# Patient Record
Sex: Male | Born: 1985 | Race: White | Hispanic: No | Smoking: Current every day smoker
Health system: Southern US, Community
[De-identification: ages and names within clinical notes are randomized; demographics above are authoritative.]

## PROBLEM LIST (undated history)

## (undated) DIAGNOSIS — F1020 Alcohol dependence, uncomplicated: Secondary | ICD-10-CM

---

## 2011-11-10 ENCOUNTER — Emergency Department: Payer: Self-pay | Admitting: Emergency Medicine

## 2019-11-22 ENCOUNTER — Ambulatory Visit: Payer: Medicaid Other | Attending: Internal Medicine

## 2020-05-03 ENCOUNTER — Inpatient Hospital Stay
Admission: EM | Admit: 2020-05-03 | Discharge: 2020-05-31 | DRG: 438 | Disposition: A | Discharge status: Home Health | Payer: Medicaid Other | Attending: Internal Medicine | Admitting: Internal Medicine

## 2020-05-03 ENCOUNTER — Emergency Department: Payer: Medicaid Other

## 2020-05-03 ENCOUNTER — Encounter: Payer: Self-pay | Admitting: Internal Medicine

## 2020-05-03 ENCOUNTER — Other Ambulatory Visit: Payer: Self-pay

## 2020-05-03 DIAGNOSIS — Z800 Family history of malignant neoplasm of digestive organs: Secondary | ICD-10-CM

## 2020-05-03 DIAGNOSIS — Z66 Do not resuscitate: Secondary | ICD-10-CM | POA: Diagnosis not present

## 2020-05-03 DIAGNOSIS — D649 Anemia, unspecified: Secondary | ICD-10-CM | POA: Diagnosis not present

## 2020-05-03 DIAGNOSIS — E8770 Fluid overload, unspecified: Secondary | ICD-10-CM | POA: Diagnosis present

## 2020-05-03 DIAGNOSIS — F1010 Alcohol abuse, uncomplicated: Secondary | ICD-10-CM | POA: Diagnosis not present

## 2020-05-03 DIAGNOSIS — F10229 Alcohol dependence with intoxication, unspecified: Secondary | ICD-10-CM | POA: Diagnosis present

## 2020-05-03 DIAGNOSIS — K852 Alcohol induced acute pancreatitis without necrosis or infection: Principal | ICD-10-CM | POA: Diagnosis present

## 2020-05-03 DIAGNOSIS — L89620 Pressure ulcer of left heel, unstageable: Secondary | ICD-10-CM | POA: Diagnosis present

## 2020-05-03 DIAGNOSIS — E861 Hypovolemia: Secondary | ICD-10-CM | POA: Diagnosis present

## 2020-05-03 DIAGNOSIS — R7982 Elevated C-reactive protein (CRP): Secondary | ICD-10-CM | POA: Diagnosis not present

## 2020-05-03 DIAGNOSIS — F411 Generalized anxiety disorder: Secondary | ICD-10-CM | POA: Diagnosis present

## 2020-05-03 DIAGNOSIS — Z23 Encounter for immunization: Secondary | ICD-10-CM

## 2020-05-03 DIAGNOSIS — K7011 Alcoholic hepatitis with ascites: Secondary | ICD-10-CM

## 2020-05-03 DIAGNOSIS — B370 Candidal stomatitis: Secondary | ICD-10-CM | POA: Diagnosis not present

## 2020-05-03 DIAGNOSIS — L89610 Pressure ulcer of right heel, unstageable: Secondary | ICD-10-CM | POA: Diagnosis present

## 2020-05-03 DIAGNOSIS — Z811 Family history of alcohol abuse and dependence: Secondary | ICD-10-CM

## 2020-05-03 DIAGNOSIS — R000 Tachycardia, unspecified: Secondary | ICD-10-CM | POA: Diagnosis not present

## 2020-05-03 DIAGNOSIS — R64 Cachexia: Secondary | ICD-10-CM | POA: Diagnosis present

## 2020-05-03 DIAGNOSIS — L8990 Pressure ulcer of unspecified site, unspecified stage: Secondary | ICD-10-CM | POA: Insufficient documentation

## 2020-05-03 DIAGNOSIS — Z681 Body mass index (BMI) 19 or less, adult: Secondary | ICD-10-CM

## 2020-05-03 DIAGNOSIS — G9341 Metabolic encephalopathy: Secondary | ICD-10-CM | POA: Diagnosis present

## 2020-05-03 DIAGNOSIS — E871 Hypo-osmolality and hyponatremia: Secondary | ICD-10-CM

## 2020-05-03 DIAGNOSIS — M793 Panniculitis, unspecified: Secondary | ICD-10-CM | POA: Diagnosis present

## 2020-05-03 DIAGNOSIS — G319 Degenerative disease of nervous system, unspecified: Secondary | ICD-10-CM | POA: Diagnosis present

## 2020-05-03 DIAGNOSIS — D638 Anemia in other chronic diseases classified elsewhere: Secondary | ICD-10-CM | POA: Diagnosis present

## 2020-05-03 DIAGNOSIS — E876 Hypokalemia: Secondary | ICD-10-CM | POA: Diagnosis not present

## 2020-05-03 DIAGNOSIS — N179 Acute kidney failure, unspecified: Secondary | ICD-10-CM | POA: Diagnosis present

## 2020-05-03 DIAGNOSIS — I248 Other forms of acute ischemic heart disease: Secondary | ICD-10-CM | POA: Diagnosis present

## 2020-05-03 DIAGNOSIS — T8092XA Unspecified transfusion reaction, initial encounter: Secondary | ICD-10-CM | POA: Diagnosis not present

## 2020-05-03 DIAGNOSIS — F10231 Alcohol dependence with withdrawal delirium: Secondary | ICD-10-CM | POA: Diagnosis present

## 2020-05-03 DIAGNOSIS — I776 Arteritis, unspecified: Secondary | ICD-10-CM | POA: Diagnosis not present

## 2020-05-03 DIAGNOSIS — L129 Pemphigoid, unspecified: Secondary | ICD-10-CM | POA: Diagnosis present

## 2020-05-03 DIAGNOSIS — R946 Abnormal results of thyroid function studies: Secondary | ICD-10-CM | POA: Diagnosis not present

## 2020-05-03 DIAGNOSIS — Z7189 Other specified counseling: Secondary | ICD-10-CM | POA: Diagnosis not present

## 2020-05-03 DIAGNOSIS — K861 Other chronic pancreatitis: Secondary | ICD-10-CM | POA: Diagnosis present

## 2020-05-03 DIAGNOSIS — A419 Sepsis, unspecified organism: Secondary | ICD-10-CM | POA: Diagnosis not present

## 2020-05-03 DIAGNOSIS — R748 Abnormal levels of other serum enzymes: Secondary | ICD-10-CM | POA: Diagnosis not present

## 2020-05-03 DIAGNOSIS — R9401 Abnormal electroencephalogram [EEG]: Secondary | ICD-10-CM | POA: Diagnosis present

## 2020-05-03 DIAGNOSIS — I959 Hypotension, unspecified: Secondary | ICD-10-CM | POA: Diagnosis not present

## 2020-05-03 DIAGNOSIS — K760 Fatty (change of) liver, not elsewhere classified: Secondary | ICD-10-CM | POA: Diagnosis present

## 2020-05-03 DIAGNOSIS — F332 Major depressive disorder, recurrent severe without psychotic features: Secondary | ICD-10-CM | POA: Diagnosis present

## 2020-05-03 DIAGNOSIS — Y848 Other medical procedures as the cause of abnormal reaction of the patient, or of later complication, without mention of misadventure at the time of the procedure: Secondary | ICD-10-CM | POA: Diagnosis not present

## 2020-05-03 DIAGNOSIS — Z515 Encounter for palliative care: Secondary | ICD-10-CM | POA: Diagnosis not present

## 2020-05-03 DIAGNOSIS — R6521 Severe sepsis with septic shock: Secondary | ICD-10-CM | POA: Diagnosis not present

## 2020-05-03 DIAGNOSIS — E860 Dehydration: Secondary | ICD-10-CM | POA: Diagnosis present

## 2020-05-03 DIAGNOSIS — R4182 Altered mental status, unspecified: Secondary | ICD-10-CM | POA: Diagnosis not present

## 2020-05-03 DIAGNOSIS — R195 Other fecal abnormalities: Secondary | ICD-10-CM | POA: Diagnosis not present

## 2020-05-03 DIAGNOSIS — E872 Acidosis: Secondary | ICD-10-CM | POA: Diagnosis not present

## 2020-05-03 DIAGNOSIS — K863 Pseudocyst of pancreas: Secondary | ICD-10-CM | POA: Diagnosis present

## 2020-05-03 DIAGNOSIS — Z20822 Contact with and (suspected) exposure to covid-19: Secondary | ICD-10-CM | POA: Diagnosis present

## 2020-05-03 DIAGNOSIS — F17200 Nicotine dependence, unspecified, uncomplicated: Secondary | ICD-10-CM | POA: Diagnosis present

## 2020-05-03 DIAGNOSIS — R188 Other ascites: Secondary | ICD-10-CM | POA: Diagnosis not present

## 2020-05-03 DIAGNOSIS — R300 Dysuria: Secondary | ICD-10-CM | POA: Diagnosis not present

## 2020-05-03 DIAGNOSIS — R700 Elevated erythrocyte sedimentation rate: Secondary | ICD-10-CM | POA: Diagnosis not present

## 2020-05-03 DIAGNOSIS — R52 Pain, unspecified: Secondary | ICD-10-CM

## 2020-05-03 DIAGNOSIS — K2920 Alcoholic gastritis without bleeding: Secondary | ICD-10-CM | POA: Diagnosis present

## 2020-05-03 DIAGNOSIS — R21 Rash and other nonspecific skin eruption: Secondary | ICD-10-CM | POA: Diagnosis not present

## 2020-05-03 DIAGNOSIS — F10931 Alcohol use, unspecified with withdrawal delirium: Secondary | ICD-10-CM

## 2020-05-03 DIAGNOSIS — K659 Peritonitis, unspecified: Secondary | ICD-10-CM | POA: Diagnosis present

## 2020-05-03 DIAGNOSIS — D72829 Elevated white blood cell count, unspecified: Secondary | ICD-10-CM | POA: Diagnosis not present

## 2020-05-03 DIAGNOSIS — K859 Acute pancreatitis without necrosis or infection, unspecified: Secondary | ICD-10-CM | POA: Diagnosis not present

## 2020-05-03 DIAGNOSIS — D6862 Lupus anticoagulant syndrome: Secondary | ICD-10-CM | POA: Diagnosis present

## 2020-05-03 DIAGNOSIS — Z95828 Presence of other vascular implants and grafts: Secondary | ICD-10-CM

## 2020-05-03 DIAGNOSIS — L89150 Pressure ulcer of sacral region, unstageable: Secondary | ICD-10-CM | POA: Diagnosis present

## 2020-05-03 DIAGNOSIS — M352 Behcet's disease: Secondary | ICD-10-CM | POA: Diagnosis present

## 2020-05-03 DIAGNOSIS — Z4659 Encounter for fitting and adjustment of other gastrointestinal appliance and device: Secondary | ICD-10-CM

## 2020-05-03 DIAGNOSIS — D696 Thrombocytopenia, unspecified: Secondary | ICD-10-CM | POA: Diagnosis not present

## 2020-05-03 DIAGNOSIS — E512 Wernicke's encephalopathy: Secondary | ICD-10-CM | POA: Diagnosis not present

## 2020-05-03 DIAGNOSIS — E43 Unspecified severe protein-calorie malnutrition: Secondary | ICD-10-CM

## 2020-05-03 DIAGNOSIS — R140 Abdominal distension (gaseous): Secondary | ICD-10-CM

## 2020-05-03 DIAGNOSIS — R410 Disorientation, unspecified: Secondary | ICD-10-CM | POA: Diagnosis present

## 2020-05-03 DIAGNOSIS — R197 Diarrhea, unspecified: Secondary | ICD-10-CM | POA: Diagnosis not present

## 2020-05-03 DIAGNOSIS — R578 Other shock: Secondary | ICD-10-CM | POA: Diagnosis not present

## 2020-05-03 DIAGNOSIS — G08 Intracranial and intraspinal phlebitis and thrombophlebitis: Secondary | ICD-10-CM | POA: Diagnosis present

## 2020-05-03 DIAGNOSIS — R569 Unspecified convulsions: Secondary | ICD-10-CM | POA: Diagnosis present

## 2020-05-03 DIAGNOSIS — R319 Hematuria, unspecified: Secondary | ICD-10-CM | POA: Diagnosis not present

## 2020-05-03 DIAGNOSIS — Z0189 Encounter for other specified special examinations: Secondary | ICD-10-CM

## 2020-05-03 HISTORY — DX: Alcohol dependence, uncomplicated: F10.20

## 2020-05-03 LAB — COMPREHENSIVE METABOLIC PANEL
ALT: 18 U/L (ref 0–44)
AST: 36 U/L (ref 15–41)
Albumin: 2.4 g/dL — ABNORMAL LOW (ref 3.5–5.0)
Alkaline Phosphatase: 64 U/L (ref 38–126)
Anion gap: 19 — ABNORMAL HIGH (ref 5–15)
BUN: 33 mg/dL — ABNORMAL HIGH (ref 6–20)
CO2: 15 mmol/L — ABNORMAL LOW (ref 22–32)
Calcium: 6.8 mg/dL — ABNORMAL LOW (ref 8.9–10.3)
Chloride: 90 mmol/L — ABNORMAL LOW (ref 98–111)
Creatinine, Ser: 1.75 mg/dL — ABNORMAL HIGH (ref 0.61–1.24)
GFR calc Af Amer: 58 mL/min — ABNORMAL LOW (ref >60)
GFR calc non Af Amer: 50 mL/min — ABNORMAL LOW (ref >60)
Glucose, Bld: 162 mg/dL — ABNORMAL HIGH (ref 70–99)
Potassium: 2.8 mmol/L — ABNORMAL LOW (ref 3.5–5.1)
Sodium: 124 mmol/L — ABNORMAL LOW (ref 135–145)
Total Bilirubin: 2.1 mg/dL — ABNORMAL HIGH (ref 0.3–1.2)
Total Protein: 5.1 g/dL — ABNORMAL LOW (ref 6.5–8.1)

## 2020-05-03 LAB — TYPE AND SCREEN
ABO/RH(D): A POS
Antibody Screen: NEGATIVE

## 2020-05-03 LAB — LACTIC ACID, PLASMA: Lactic Acid, Venous: 2.8 mmol/L (ref 0.5–1.9)

## 2020-05-03 LAB — CBC WITH DIFFERENTIAL/PLATELET
Abs Immature Granulocytes: 0.07 10*3/uL (ref 0.00–0.07)
Basophils Absolute: 0.1 10*3/uL (ref 0.0–0.1)
Basophils Relative: 1 %
Eosinophils Absolute: 0 10*3/uL (ref 0.0–0.5)
Eosinophils Relative: 0 %
HCT: 42.5 % (ref 39.0–52.0)
Hemoglobin: 15.3 g/dL (ref 13.0–17.0)
Immature Granulocytes: 1 %
Lymphocytes Relative: 8 %
Lymphs Abs: 0.9 10*3/uL (ref 0.7–4.0)
MCH: 34.3 pg — ABNORMAL HIGH (ref 26.0–34.0)
MCHC: 36 g/dL (ref 30.0–36.0)
MCV: 95.3 fL (ref 80.0–100.0)
Monocytes Absolute: 1.4 10*3/uL — ABNORMAL HIGH (ref 0.1–1.0)
Monocytes Relative: 12 %
Neutro Abs: 9.5 10*3/uL — ABNORMAL HIGH (ref 1.7–7.7)
Neutrophils Relative %: 78 %
Platelets: 110 10*3/uL — ABNORMAL LOW (ref 150–400)
RBC: 4.46 MIL/uL (ref 4.22–5.81)
RDW: 11.7 % (ref 11.5–15.5)
Smear Review: NORMAL
WBC: 11.9 10*3/uL — ABNORMAL HIGH (ref 4.0–10.5)
nRBC: 0.2 % (ref 0.0–0.2)

## 2020-05-03 LAB — SARS CORONAVIRUS 2 BY RT PCR (HOSPITAL ORDER, PERFORMED IN ~~LOC~~ HOSPITAL LAB): SARS Coronavirus 2: NEGATIVE

## 2020-05-03 LAB — TROPONIN I (HIGH SENSITIVITY): Troponin I (High Sensitivity): 48 ng/L — ABNORMAL HIGH (ref <18)

## 2020-05-03 LAB — GLUCOSE, CAPILLARY: Glucose-Capillary: 152 mg/dL — ABNORMAL HIGH (ref 70–99)

## 2020-05-03 LAB — MAGNESIUM: Magnesium: 2.4 mg/dL (ref 1.7–2.4)

## 2020-05-03 LAB — PROTIME-INR
INR: 1.1 (ref 0.8–1.2)
Prothrombin Time: 13.6 seconds (ref 11.4–15.2)

## 2020-05-03 LAB — CK: Total CK: 738 U/L — ABNORMAL HIGH (ref 49–397)

## 2020-05-03 IMAGING — CT CT HEAD W/O CM
4 of 5 series · 14 of 47 positions shown, 16 images · non-contrast
Comparison: None.

CLINICAL DATA: Seizure, nontraumatic (Age 18-40y)

EXAM:
CT HEAD WITHOUT CONTRAST
TECHNIQUE: Contiguous axial images were obtained from the base of the skull
through the vertex without intravenous contrast.

[Series 3: head wo · axial · 0.43mm/px · z∈[+129,+189]mm · 3 of 31 slices shown]
[im 7/31  brain]
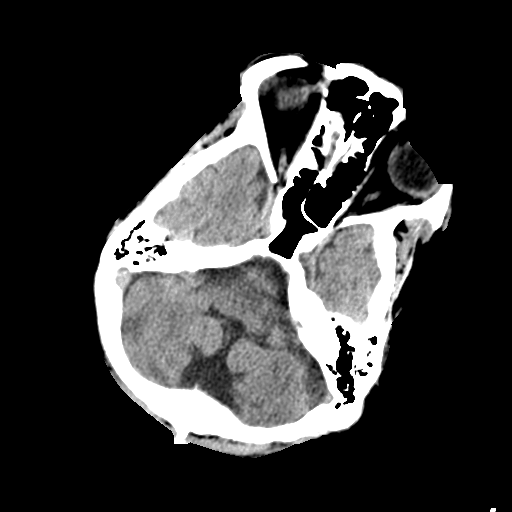
[im 13/31  brain]
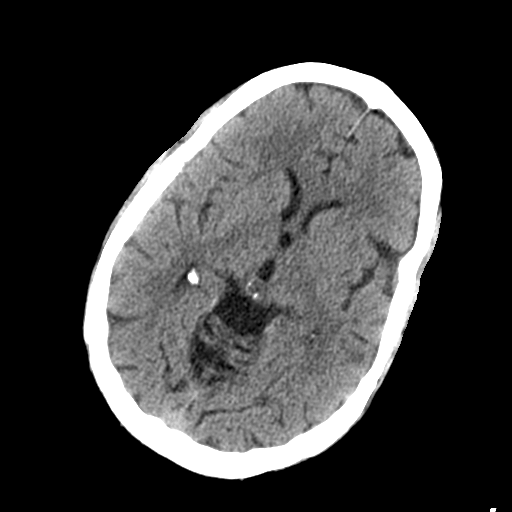
[im 19/31  brain]
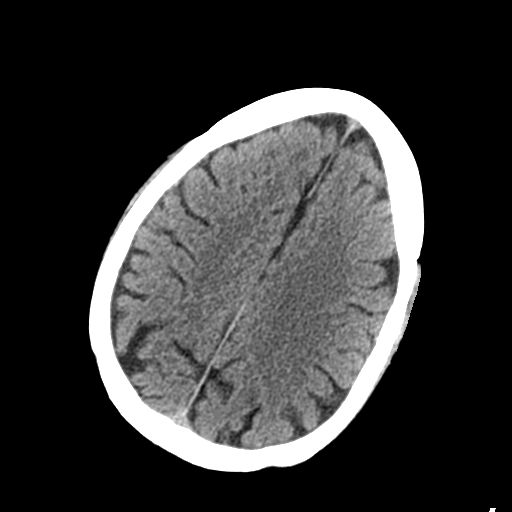

[Series 4: coronal soft tissue · coronal · 0.32mm/px · 3 of 69 slices shown]
[im 23/69  brain]
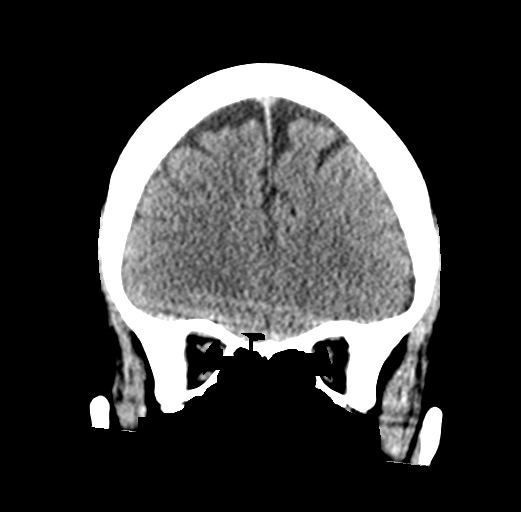
[im 31/69  brain]
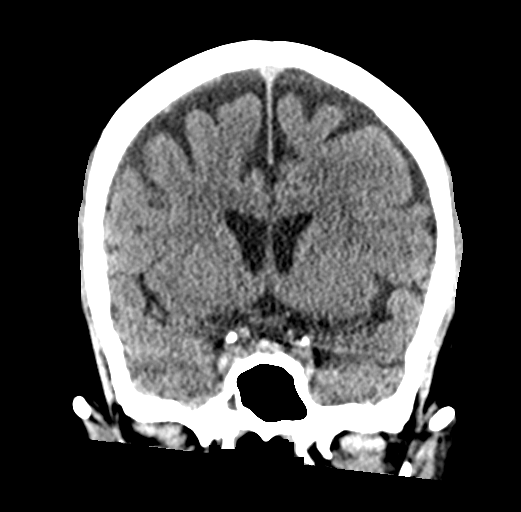
[im 38/69  brain]
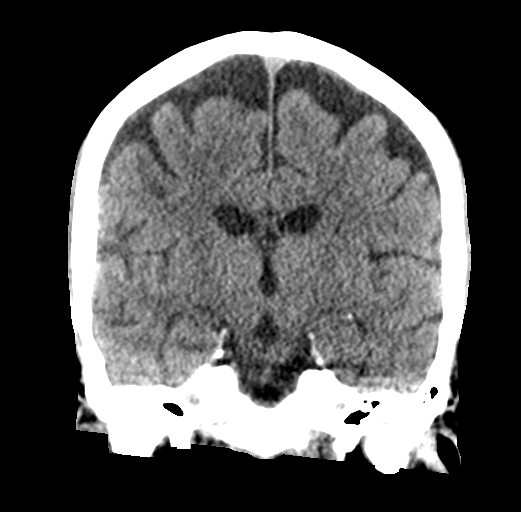

[Series 5: sagittal soft tissue · sagittal · 0.34mm/px · 3 of 48 slices shown]
[im 16/48  brain]
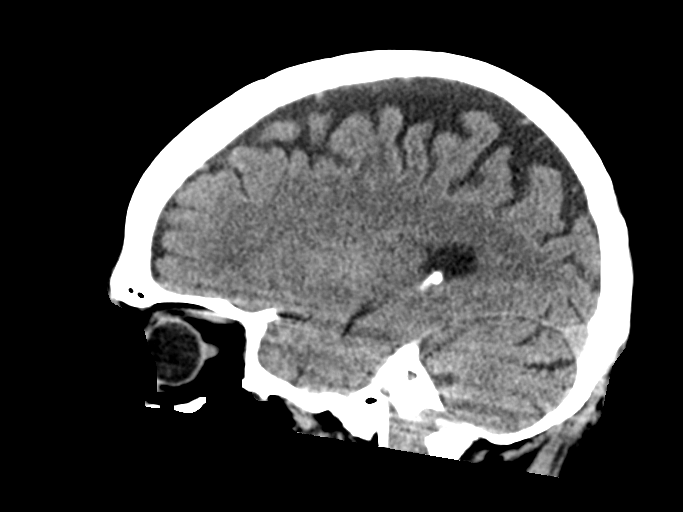
[im 24/48  brain]
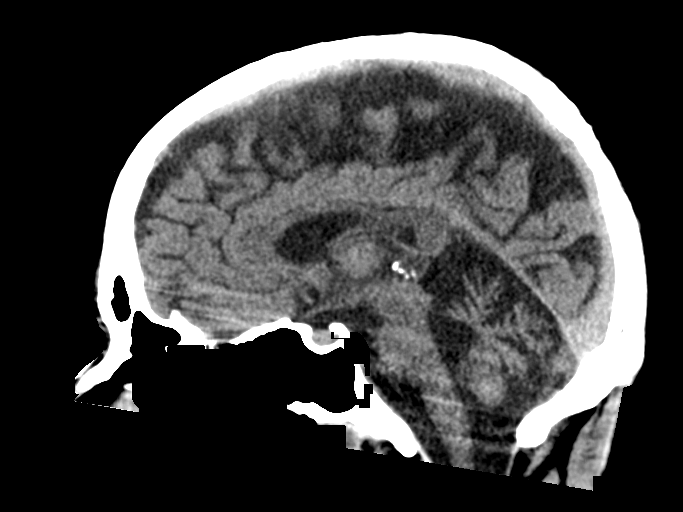
[im 32/48  brain]
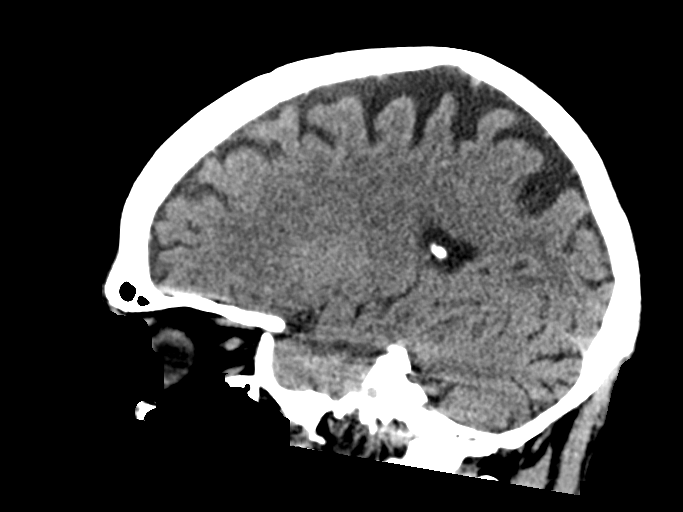

[Series 6: ax head wo recon · axial · 0.33mm/px · z∈[+101,+201]mm · 5 of 31 slices shown, 7 images]
[im 6/31  brain]
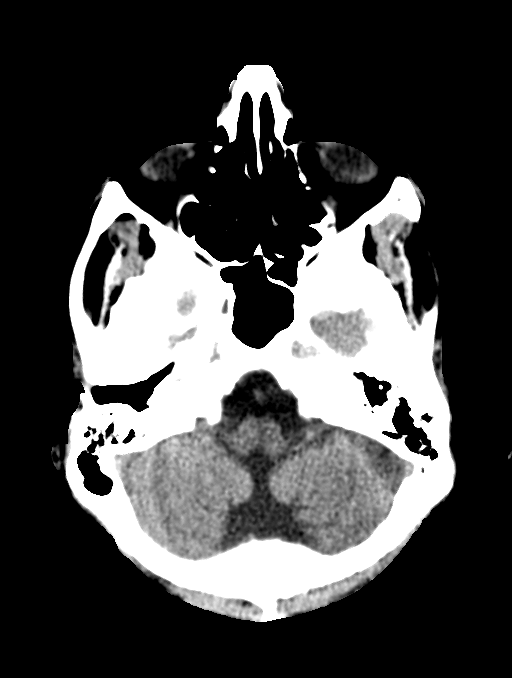
[im 6/31  bone]
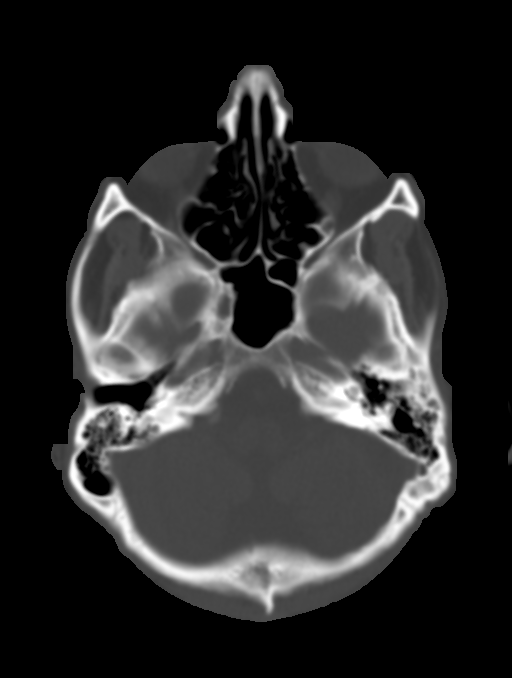
[im 11/31  brain]
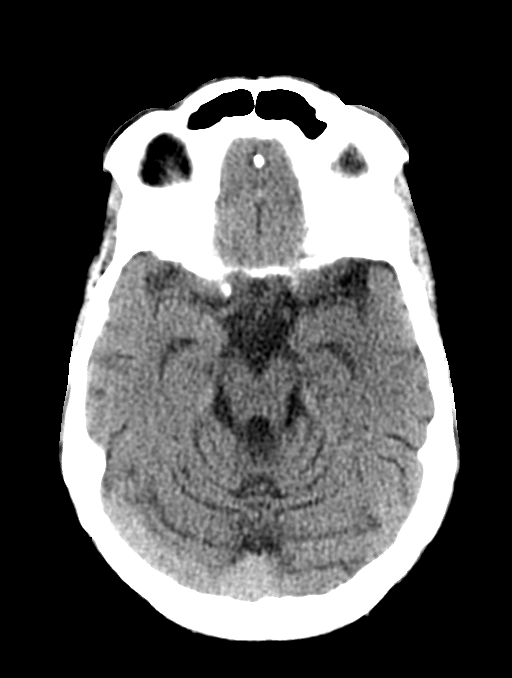
[im 16/31  brain]
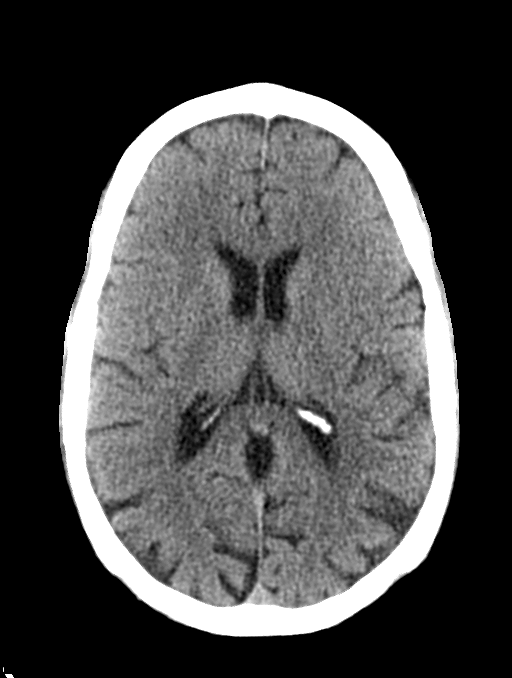
[im 21/31  brain]
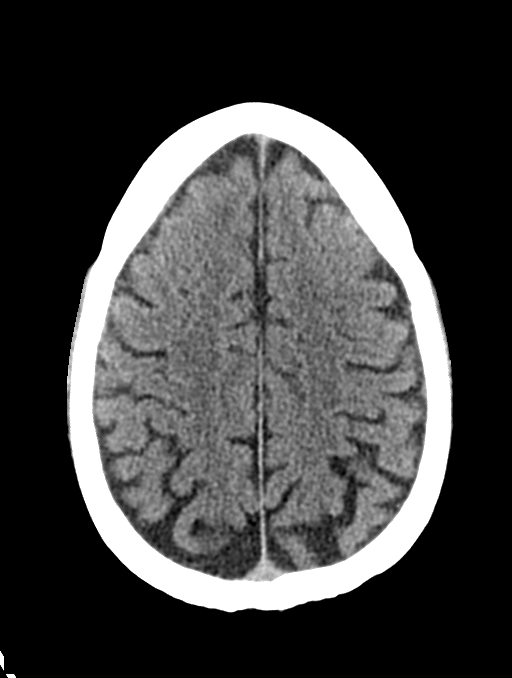
[im 26/31  brain]
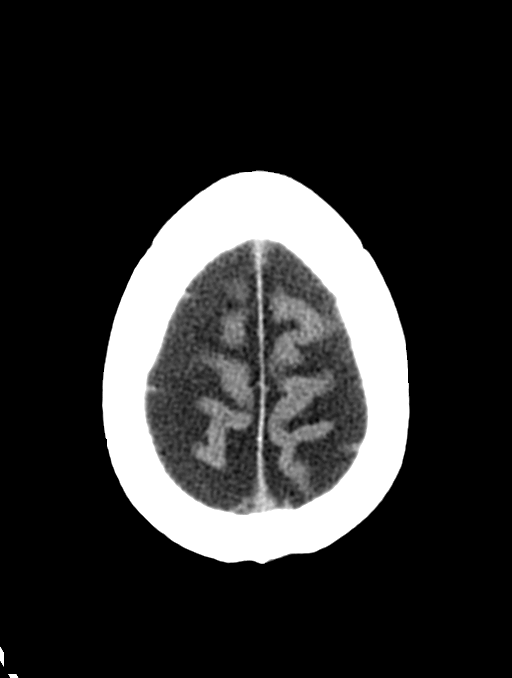
[im 26/31  bone]
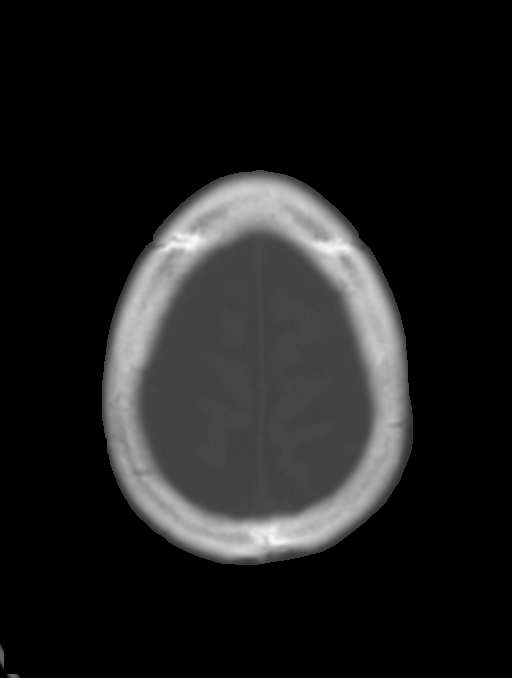

[14 of 47 positions shown; findings below may reference images not displayed]

FINDINGS: Brain: No evidence of acute infarction, hemorrhage, hydrocephalus,
extra-axial collection or mass lesion/mass effect. Generalized
cerebral and cerebellar atrophy which is age advanced. No evidence
of pontine hypodensity on CT.

Vascular: No hyperdense vessel or unexpected calcification.

Skull: No fracture or focal lesion.

Sinuses/Orbits: Trace mucosal thickening of the right ethmoid air
cell. Paranasal sinuses are otherwise clear. Mastoid air cells are
well aerated. The orbits are unremarkable.

Other: None.
IMPRESSION: 1. No acute intracranial abnormality.
2. Generalized cerebral and cerebellar atrophy, age advanced.

## 2020-05-03 IMAGING — DX DG CHEST 1V PORT
1 series · 1 of 1 positions shown · non-contrast
Comparison: None

CLINICAL DATA: Altered mental status.

EXAM:
PORTABLE CHEST 1 VIEW

[chest ap]
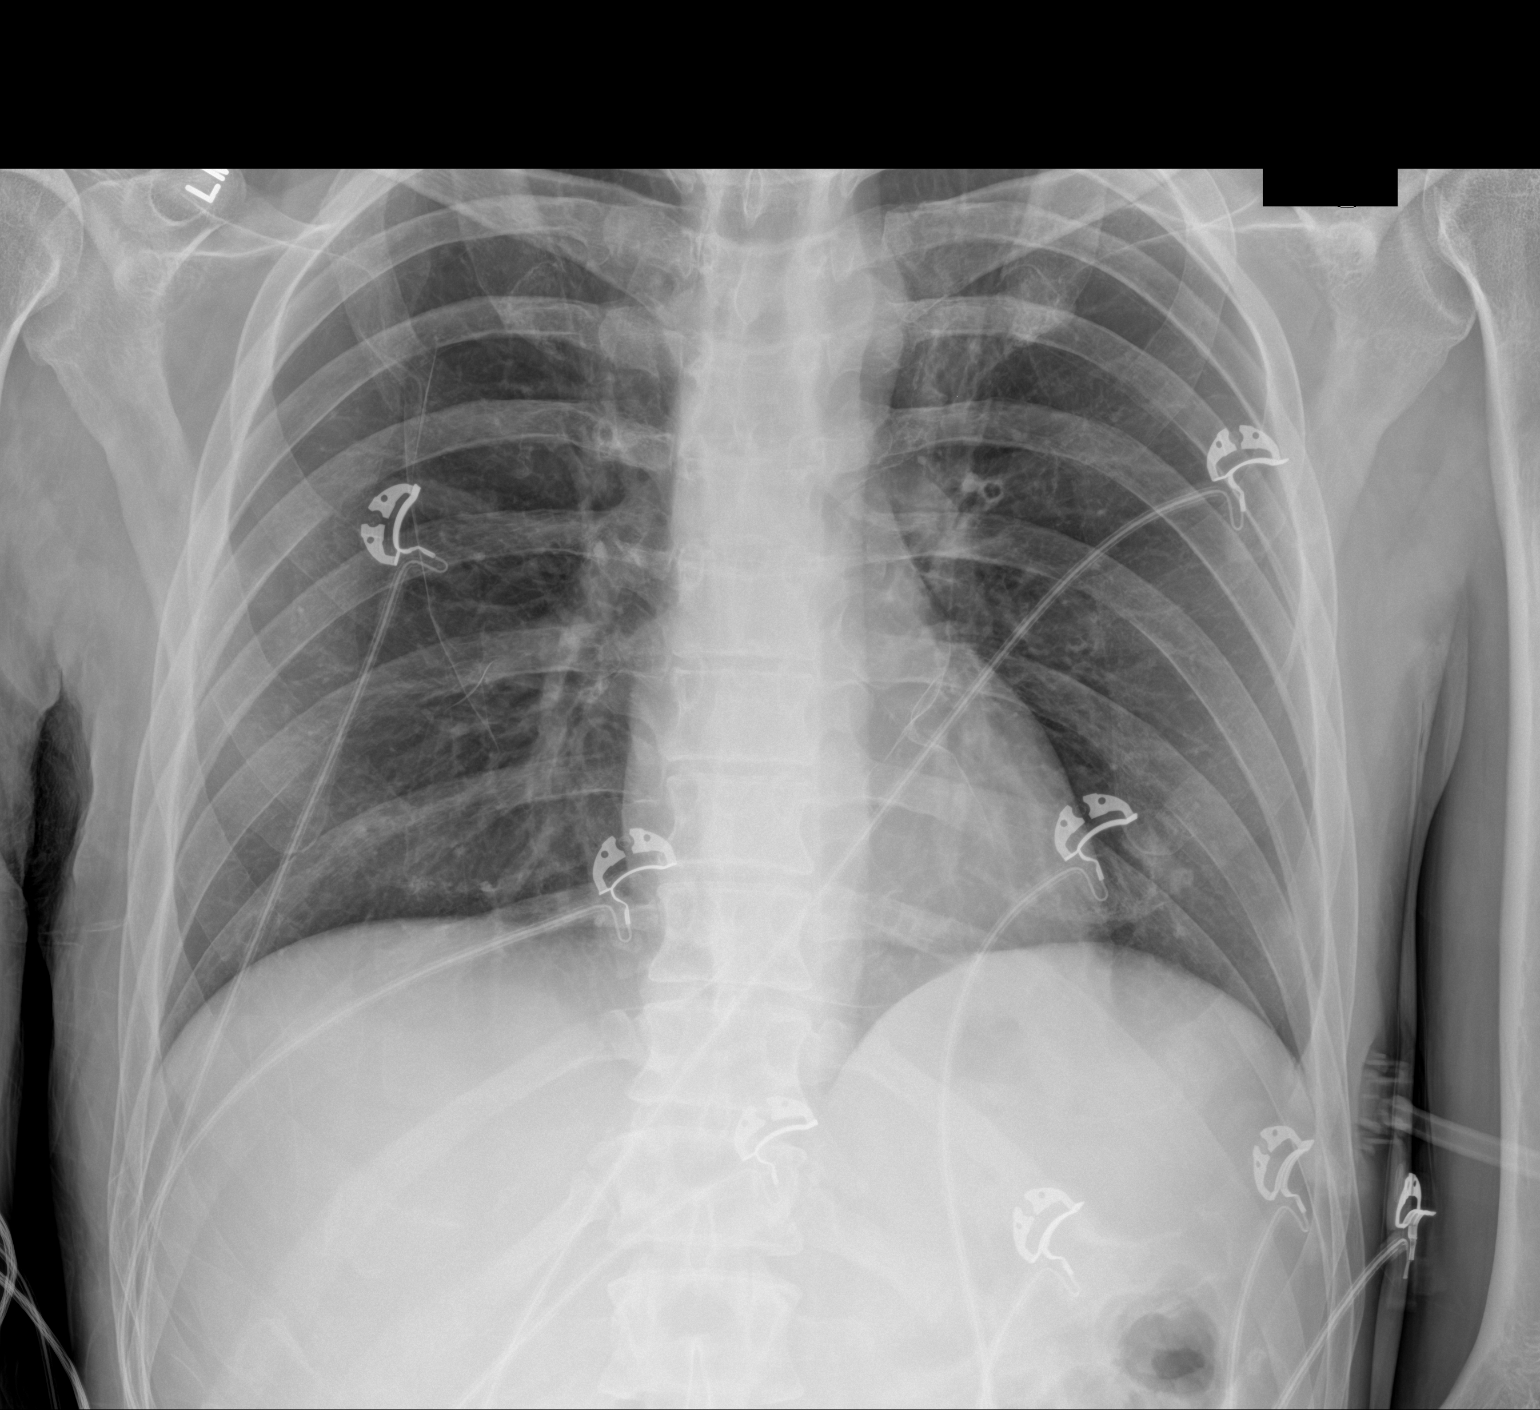

[1 of 1 positions shown; findings below may reference images not displayed]

FINDINGS: The heart size and mediastinal contours are within normal limits.
Both lungs are clear. The visualized skeletal structures are
unremarkable.
IMPRESSION: Normal exam.

## 2020-05-03 MED ORDER — LACTATED RINGERS IV BOLUS: 1000.0000 mL | Freq: Once | INTRAVENOUS | Status: DC

## 2020-05-03 MED ORDER — LORAZEPAM 2 MG PO TABS: 0.0000 mg | ORAL_TABLET | Freq: Four times a day (QID) | ORAL | Status: DC

## 2020-05-03 MED ORDER — CHLORDIAZEPOXIDE HCL 25 MG PO CAPS
25.0000 mg | ORAL_CAPSULE | ORAL | Status: DC
  Filled 2020-05-03: qty 1

## 2020-05-03 MED ORDER — POTASSIUM CHLORIDE IN NACL 40-0.9 MEQ/L-% IV SOLN
INTRAVENOUS | Status: AC
  Filled 2020-05-03 (×2): qty 1000

## 2020-05-03 MED ORDER — LORAZEPAM 2 MG/ML IJ SOLN: 0.0000 mg | Freq: Four times a day (QID) | INTRAMUSCULAR | Status: DC

## 2020-05-03 MED ORDER — CHLORDIAZEPOXIDE HCL 25 MG PO CAPS: 25.0000 mg | ORAL_CAPSULE | Freq: Every day | ORAL | Status: DC

## 2020-05-03 MED ORDER — THIAMINE HCL 100 MG PO TABS: 100.0000 mg | ORAL_TABLET | Freq: Every day | ORAL | Status: DC

## 2020-05-03 MED ORDER — ONDANSETRON HCL 4 MG/2ML IJ SOLN
4.0000 mg | Freq: Four times a day (QID) | INTRAMUSCULAR | Status: DC | PRN
  Administered 2020-05-04 – 2020-05-22 (×6): 4 mg via INTRAVENOUS
  Filled 2020-05-03 (×6): qty 2

## 2020-05-03 MED ORDER — LABETALOL HCL 5 MG/ML IV SOLN
INTRAVENOUS | Status: AC
  Administered 2020-05-03: 10 mg via INTRAVENOUS
  Filled 2020-05-03: qty 4

## 2020-05-03 MED ORDER — MAGNESIUM SULFATE 2 GM/50ML IV SOLN
2.0000 g | Freq: Once | INTRAVENOUS | Status: AC
  Administered 2020-05-03: 2 g via INTRAVENOUS
  Filled 2020-05-03: qty 50

## 2020-05-03 MED ORDER — LORAZEPAM 2 MG/ML IJ SOLN
1.0000 mg | INTRAMUSCULAR | Status: AC | PRN
  Administered 2020-05-04: 2 mg via INTRAVENOUS
  Filled 2020-05-03: qty 1

## 2020-05-03 MED ORDER — PANTOPRAZOLE SODIUM 40 MG IV SOLR: 40.0000 mg | Freq: Once | INTRAVENOUS | Status: DC

## 2020-05-03 MED ORDER — LORAZEPAM 2 MG/ML IJ SOLN
2.0000 mg | Freq: Once | INTRAMUSCULAR | Status: AC
  Administered 2020-05-03: 2 mg via INTRAVENOUS

## 2020-05-03 MED ORDER — POTASSIUM CHLORIDE IN NACL 20-0.9 MEQ/L-% IV SOLN: Freq: Once | INTRAVENOUS | Status: AC

## 2020-05-03 MED ORDER — THIAMINE HCL 100 MG/ML IJ SOLN
100.0000 mg | Freq: Every day | INTRAMUSCULAR | Status: DC
  Administered 2020-05-07 – 2020-05-14 (×4): 100 mg via INTRAVENOUS
  Filled 2020-05-03 (×6): qty 2

## 2020-05-03 MED ORDER — SODIUM CHLORIDE 0.9 % IV BOLUS
1000.0000 mL | Freq: Once | INTRAVENOUS | Status: AC
  Administered 2020-05-03: 1000 mL via INTRAVENOUS

## 2020-05-03 MED ORDER — LORAZEPAM 1 MG PO TABS: 1.0000 mg | ORAL_TABLET | ORAL | Status: AC | PRN

## 2020-05-03 MED ORDER — CHLORHEXIDINE GLUCONATE CLOTH 2 % EX PADS
6.0000 | MEDICATED_PAD | Freq: Every day | CUTANEOUS | Status: DC
  Administered 2020-05-04 – 2020-05-15 (×9): 6 via TOPICAL
  Filled 2020-05-03: qty 6

## 2020-05-03 MED ORDER — LORAZEPAM 2 MG/ML IJ SOLN: 0.0000 mg | Freq: Two times a day (BID) | INTRAMUSCULAR | Status: DC

## 2020-05-03 MED ORDER — THIAMINE HCL 100 MG/ML IJ SOLN
100.0000 mg | Freq: Every day | INTRAMUSCULAR | Status: DC
  Administered 2020-05-03: 100 mg via INTRAVENOUS
  Filled 2020-05-03: qty 2

## 2020-05-03 MED ORDER — THIAMINE HCL 100 MG PO TABS
100.0000 mg | ORAL_TABLET | Freq: Every day | ORAL | Status: DC
  Administered 2020-05-04 – 2020-05-15 (×8): 100 mg via ORAL
  Filled 2020-05-03 (×11): qty 1

## 2020-05-03 MED ORDER — CHLORDIAZEPOXIDE HCL 25 MG PO CAPS
25.0000 mg | ORAL_CAPSULE | Freq: Three times a day (TID) | ORAL | Status: AC
  Administered 2020-05-05 – 2020-05-06 (×3): 25 mg via ORAL
  Filled 2020-05-03 (×3): qty 1

## 2020-05-03 MED ORDER — ADULT MULTIVITAMIN W/MINERALS CH
1.0000 | ORAL_TABLET | Freq: Every day | ORAL | Status: DC
  Administered 2020-05-04 – 2020-05-15 (×12): 1 via ORAL
  Filled 2020-05-03 (×12): qty 1

## 2020-05-03 MED ORDER — ONDANSETRON HCL 4 MG PO TABS: 4.0000 mg | ORAL_TABLET | Freq: Four times a day (QID) | ORAL | Status: DC | PRN

## 2020-05-03 MED ORDER — CHLORDIAZEPOXIDE HCL 25 MG PO CAPS
25.0000 mg | ORAL_CAPSULE | Freq: Four times a day (QID) | ORAL | Status: AC
  Administered 2020-05-04 – 2020-05-05 (×5): 25 mg via ORAL
  Filled 2020-05-03 (×5): qty 1

## 2020-05-03 MED ORDER — LABETALOL HCL 5 MG/ML IV SOLN
10.0000 mg | Freq: Four times a day (QID) | INTRAVENOUS | Status: DC | PRN
  Administered 2020-05-09 – 2020-05-10 (×2): 10 mg via INTRAVENOUS
  Filled 2020-05-03 (×3): qty 4

## 2020-05-03 MED ORDER — PANTOPRAZOLE SODIUM 40 MG IV SOLR
40.0000 mg | Freq: Two times a day (BID) | INTRAVENOUS | Status: DC
  Administered 2020-05-04 – 2020-05-11 (×17): 40 mg via INTRAVENOUS
  Filled 2020-05-03 (×17): qty 40

## 2020-05-03 MED ORDER — LORAZEPAM 2 MG PO TABS: 0.0000 mg | ORAL_TABLET | Freq: Two times a day (BID) | ORAL | Status: DC

## 2020-05-03 MED ORDER — LOPERAMIDE HCL 2 MG PO CAPS: 2.0000 mg | ORAL_CAPSULE | ORAL | Status: AC | PRN

## 2020-05-03 MED ORDER — FOLIC ACID 1 MG PO TABS
1.0000 mg | ORAL_TABLET | Freq: Every day | ORAL | Status: DC
  Administered 2020-05-04 – 2020-05-15 (×12): 1 mg via ORAL
  Filled 2020-05-03 (×12): qty 1

## 2020-05-03 MED ORDER — LORAZEPAM 2 MG/ML IJ SOLN: 2.0000 mg | Freq: Once | INTRAMUSCULAR | Status: DC

## 2020-05-03 MED ORDER — HYDROXYZINE HCL 25 MG PO TABS
25.0000 mg | ORAL_TABLET | Freq: Four times a day (QID) | ORAL | Status: AC | PRN
  Filled 2020-05-03: qty 1

## 2020-05-03 MED ORDER — SODIUM CHLORIDE 0.9% FLUSH
3.0000 mL | Freq: Two times a day (BID) | INTRAVENOUS | Status: DC
  Administered 2020-05-04 – 2020-05-31 (×49): 3 mL via INTRAVENOUS

## 2020-05-03 MED ORDER — POTASSIUM CHLORIDE 10 MEQ/100ML IV SOLN
10.0000 meq | INTRAVENOUS | Status: AC
  Administered 2020-05-04: 10 meq via INTRAVENOUS
  Filled 2020-05-03: qty 100

## 2020-05-03 NOTE — ED Provider Notes (Signed)
[facility] Emergency Department Provider Note    None    (approximate)  I have reviewed the triage vital signs and the nursing notes.   HISTORY  Chief Complaint Weakness  Level V Caveat:  AMS - seizure  HPI Shane Chittick Sr. is a 34 y.o. male resents to the ER for weakness with his spouse.  Reported is becoming increasingly confused having diarrheal episodes for the past few days slurred speech.  While in triage patient became unresponsive.  No reported shaking episodes was brought back was found hypotensive minimally responsive after roughly 30 seconds the patient did start coming back around and appeared postictal.  Spouse reports the patient drinks 12 to 24 cans of beer daily for the past 10 years has a significant family history of alcohol abuse.  States that that is pretty much the only thing that he consumes is alcohol.  Was reportedly having multiple episodes of dark tarry stool earlier in the week.    No past medical history on file. No family history on file.  There are no problems to display for this patient.     Prior to Admission medications   Not on File    Allergies Patient has no allergy information on record.    Social History Social History   Tobacco Use  . Smoking status: Not on file  Substance Use Topics  . Alcohol use: Not on file  . Drug use: Not on file    Review of Systems Patient denies headaches, rhinorrhea, blurry vision, numbness, shortness of breath, chest pain, edema, cough, abdominal pain, nausea, vomiting, diarrhea, dysuria, fevers, rashes or hallucinations unless otherwise stated above in HPI. ____________________________________________   PHYSICAL EXAM:  VITAL SIGNS: Vitals:   05/03/20 1943 05/03/20 2029  BP:    Pulse:    Resp:  19  Temp: 98.7 F (37.1 C)   SpO2:      Constitutional: ill appearing, encephalopathic, MAE spontaneously, following simple commands but unable to provide, protecting  airway Eyes: Conjunctivae are normal.  Head: Atraumatic. Nose: No congestion/rhinnorhea. Mouth/Throat: Mucous membranes are moist.   Neck: No stridor. Painless ROM.  Cardiovascular: tachycardic rate, regular rhythm. Grossly normal heart sounds.  Good peripheral circulation. Respiratory: Normal respiratory effort.  No retractions. Tachypnea on NRB Gastrointestinal: Soft and nontender. No distention. No abdominal bruits. No CVA tenderness. Genitourinary: normal external genitalia, heme+ stool Musculoskeletal: No lower extremity tenderness nor edema.  No joint effusions. Neurologic: slurred speech, tongue fasciculations, resting tremor, encephalopathy Skin:  Skin is cool, dry and intact. No rash noted. Psychiatric: unable to assess ____________________________________________   LABS (all labs ordered are listed, but only abnormal results are displayed)  Results for orders placed or performed during the hospital encounter of 05/03/20 (from the past 24 hour(s))  Glucose, capillary     Status: Abnormal   Collection Time: 05/03/20  7:31 PM  Result Value Ref Range   Glucose-Capillary 152 (H) 70 - 99 mg/dL  CBC with Differential/Platelet     Status: Abnormal   Collection Time: 05/03/20  7:42 PM  Result Value Ref Range   WBC 11.9 (H) 4.0 - 10.5 K/uL   RBC 4.46 4.22 - 5.81 MIL/uL   Hemoglobin 15.3 13.0 - 17.0 g/dL   HCT 56.3 39 - 52 %   MCV 95.3 80.0 - 100.0 fL   MCH 34.3 (H) 26.0 - 34.0 pg   MCHC 36.0 30.0 - 36.0 g/dL   RDW 14.9 70.2 - 63.7 %   Platelets 110 (  L) 150 - 400 K/uL   nRBC 0.2 0.0 - 0.2 %   Neutrophils Relative % 78 %   Neutro Abs 9.5 (H) 1.7 - 7.7 K/uL   Lymphocytes Relative 8 %   Lymphs Abs 0.9 0.7 - 4.0 K/uL   Monocytes Relative 12 %   Monocytes Absolute 1.4 (H) 0 - 1 K/uL   Eosinophils Relative 0 %   Eosinophils Absolute 0.0 0 - 0 K/uL   Basophils Relative 1 %   Basophils Absolute 0.1 0 - 0 K/uL   WBC Morphology MORPHOLOGY UNREMARKABLE    RBC Morphology MORPHOLOGY  UNREMARKABLE    Smear Review Normal platelet morphology    Immature Granulocytes 1 %   Abs Immature Granulocytes 0.07 0.00 - 0.07 K/uL  Comprehensive metabolic panel     Status: Abnormal   Collection Time: 05/03/20  7:42 PM  Result Value Ref Range   Sodium 124 (L) 135 - 145 mmol/L   Potassium 2.8 (L) 3.5 - 5.1 mmol/L   Chloride 90 (L) 98 - 111 mmol/L   CO2 15 (L) 22 - 32 mmol/L   Glucose, Bld 162 (H) 70 - 99 mg/dL   BUN 33 (H) 6 - 20 mg/dL   Creatinine, Ser 1.75 (H) 0.61 - 1.24 mg/dL   Calcium 6.8 (L) 8.9 - 10.3 mg/dL   Total Protein 5.1 (L) 6.5 - 8.1 g/dL   Albumin 2.4 (L) 3.5 - 5.0 g/dL   AST 36 15 - 41 U/L   ALT 18 0 - 44 U/L   Alkaline Phosphatase 64 38 - 126 U/L   Total Bilirubin 2.1 (H) 0.3 - 1.2 mg/dL   GFR calc non Af Amer 50 (L) >60 mL/min   GFR calc Af Amer 58 (L) >60 mL/min   Anion gap 19 (H) 5 - 15  CK     Status: Abnormal   Collection Time: 05/03/20  7:42 PM  Result Value Ref Range   Total CK 738 (H) 49.0 - 397.0 U/L   ____________________________________________  EKG My review and personal interpretation at Time:  19:32   Indication: ams  Rate: 130  Rhythm: sinus Axis: normal Other: lat twave abnormality, abnml ekg ____________________________________________  RADIOLOGY  I personally reviewed all radiographic images ordered to evaluate for the above acute complaints and reviewed radiology reports and findings.  These findings were personally discussed with the patient.  Please see medical record for radiology report.  ____________________________________________   PROCEDURES  Procedure(s) performed:  .Critical Care Performed by: Merlyn Lot, MD Authorized by: Merlyn Lot, MD   Critical care provider statement:    Critical care time (minutes):  35   Critical care time was exclusive of:  Separately billable procedures and treating other patients   Critical care was necessary to treat or prevent imminent or life-threatening deterioration of  the following conditions:  Metabolic crisis and CNS failure or compromise   Critical care was time spent personally by me on the following activities:  Development of treatment plan with patient or surrogate, discussions with consultants, evaluation of patient's response to treatment, examination of patient, obtaining history from patient or surrogate, ordering and performing treatments and interventions, ordering and review of laboratory studies, ordering and review of radiographic studies, pulse oximetry, re-evaluation of patient's condition and review of old charts      Critical Care performed: yes ____________________________________________   INITIAL IMPRESSION / ASSESSMENT AND PLAN / ED COURSE  Pertinent labs & imaging results that were available during my care of the patient  were reviewed by me and considered in my medical decision making (see chart for details).   DDX: Dehydration, sepsis, pna, uti, hypoglycemia, cva, drug effect, withdrawal, encephalitis   Shane Pullin Sr. is a 34 y.o. who presents to the ED with presentation as described above.  Patient appears to be in florid alcohol withdrawal.  Will give IV fluids as he is hypotensive.  Was given Ativan.  He is now protecting his airway after what appeared to be a seizure-like episode.  Patient be placed on CIWA protocol.  Will check blood work.  Will order CT imaging and chest x-ray.  Clinical Course as of May 04 2103  Thu May 03, 2020  2016 Patient with evidence of hyponatremia and significant metabolic abnormalities this time.  Hemodynamically stabilizing after IV fluids do suspect he is only been receiving calories via alcohol after speaking to partner.  Patient placed on CIWA protocol.  Will require admission.   [PR]  2037 MCHC: 36.0 [PR]  2101 Patient again reassessed.  Persistently tachycardic but is still receiving IV fluids.  Will add on Protonix as the patient is having heme positive stools on exam.  Discussed with  hospitalist for admission to stepdown unit for close hemodynamic monitoring.   [PR]    Clinical Course User Index [PR] Willy Eddy, MD    The patient was evaluated in Emergency Department today for the symptoms described in the history of present illness. He/she was evaluated in the context of the global COVID-19 pandemic, which necessitated consideration that the patient might be at risk for infection with the SARS-CoV-2 virus that causes COVID-19. Institutional protocols and algorithms that pertain to the evaluation of patients at risk for COVID-19 are in a state of rapid change based on information released by regulatory bodies including the CDC and federal and state organizations. These policies and algorithms were followed during the patient's care in the ED.  As part of my medical decision making, I reviewed the following data within the electronic MEDICAL RECORD NUMBER Nursing notes reviewed and incorporated, Labs reviewed, notes from prior ED visits and [place] Controlled Substance Database   ____________________________________________   FINAL CLINICAL IMPRESSION(S) / ED DIAGNOSES  Final diagnoses:  Delirium tremens (HCC)  Hyponatremia  Hypotension, unspecified hypotension type      NEW MEDICATIONS STARTED DURING THIS VISIT:  New Prescriptions   No medications on file     Note:  This document was prepared using Dragon voice recognition software and may include unintentional dictation errors.    Willy Eddy, MD 05/03/20 2104

## 2020-05-03 NOTE — ED Notes (Signed)
Pt to room from lobby; initially unresponsive even to sternal rub. Visitor to bedside. States pt recently dec drinking and has been "out of it" since then. Pt on non-rebeather. Resp therapy at bedside.

## 2020-05-03 NOTE — ED Notes (Signed)
EDP Robinson at bedside.  

## 2020-05-03 NOTE — ED Notes (Signed)
BG 152

## 2020-05-03 NOTE — ED Notes (Addendum)
2 of ativan given per verbal order from EDP Robinson. NS bolus started.

## 2020-05-03 NOTE — ED Notes (Signed)
Pt had loose BM in his personal clothes. Changed. As this RN providing peri care pt stated had to go again. Placed on bed pan. Sample sent to lab. Pads and linens changed again.

## 2020-05-03 NOTE — ED Notes (Addendum)
Shane Jenkins notified via secure chat of critical lactic 2.8. Shane Jenkins also notified of pt's continued high BP. States he doesn't take BP meds at home. L arm 148/113; R arm 150/107. Pt's arms straight and relaxed while BP cuff going off.

## 2020-05-03 NOTE — H&P (Signed)
History and Physical    Shane Jenkins DOB: 05-30-1986 DOA: 05/03/2020  PCP: Lorelee Market, MD  Patient coming from: Home  I have personally briefly reviewed patient's old medical records in [place]  Chief Complaint: Confusion  HPI: Shane Lipton Sr. is a 34 y.o. male with medical history significant for alcohol use disorder who presents to the ED for evaluation of progressive confusion with subsequent unresponsive episode while in the triage area.  History is limited from patient due to altered mental status and need for majority of history is obtained from EDP, chart review, and patient significant other at bedside.  Patient's significant other states that he has a history of chronic alcohol use and drinks two 12 packs of beer daily since about the age of 41.  She says he suddenly stopped drinking yesterday as he did not have any money to obtain more alcohol.  He has been having increasing confusion and was finally convinced to come to the hospital for further evaluation.  She says that he has been having diarrhea with dark black-colored stool.  She says he has episodes of vomiting frequently in the mornings.  She says he also appears to be pointing at things that do not appear to be present.  She says he has not really been eating and his only intake recently is alcohol.  While in the triage area he had an unresponsive episode lasting about 30 seconds.  He was suspected to have a seizure with subsequent postictal state.  Significant other states that he also smokes cigarettes daily but does not use any illicit drugs.  He is not currently taking any medications regularly.  He reports an intolerance to Sudafed.  Significant other states he has a strong family history of alcohol use disorder.  On my evaluation patient remains confused but easily awakens to voice.  He is following simple commands but is actively hallucinating and looking for ashtray to try and put out a  cigarette that is not there.   ED Course:  Initial vitals showed BP 93/77, pulse 141, RR 21, temp 98.7 Fahrenheit, SPO2 100% on room air.  Labs are notable for sodium 124, potassium 2.8, chloride 90, bicarb 15, BUN 33, creatinine 1.75, serum glucose 162, AST 36, ALT 18, alk phos 64, total bilirubin 2.1, anion gap 19, WBC 11.9, hemoglobin 15.3, platelets 110,000, CK 738.  Portable chest x-ray was negative for focal consolidation, edema, or effusion.  CT head without contrast was negative for acute intracranial abnormality.  Generalized cerebral and cerebellar atrophy is noted, advanced for age.  Patient was suspected to have a seizure with subsequent postictal state.  He was given IV Ativan 2 mg once and placed on CIWA protocol.  He was also given 2 L normal saline, 2 g IV magnesium, and thiamine.  Per EDP, patient also with melena and heme positive stool.  The hospitalist service was consulted to admit for further evaluation and management.  Review of Systems:  Unable to obtain full review of systems from patient due to AMS/delirium.  Past Medical History:  Diagnosis Date  . Alcohol use disorder, severe, dependence ([place])     History reviewed. No pertinent surgical history.  Social History:  reports that he has been smoking. He has never used smokeless tobacco. He reports current alcohol use. He reports previous drug use.  Not on File  Family History  Problem Relation Age of Onset  . Alcohol abuse Mother      Prior to  Admission medications   Not on File    Physical Exam: Vitals:   05/03/20 1943 05/03/20 2029 05/03/20 2057 05/03/20 2130  BP:   (!) 146/96   Pulse:    (!) 149  Resp:  19 (!) 27 (!) 26  Temp: 98.7 F (37.1 C)     TempSrc: Axillary     SpO2:    100%  Exam somewhat limited due to mental status. Constitutional: Thin disheveled man resting supine in bed, NAD, calm, lethargic but appears comfortable Eyes: PERRL, lids and conjunctivae normal ENMT: Mucous  membranes are dry. Posterior pharynx clear of any exudate or lesions.Normal dentition.  Neck: normal, supple, no masses. Respiratory: clear to auscultation bilaterally, no wheezing, no crackles. Normal respiratory effort. No accessory muscle use.  Cardiovascular: Tachycardic, no murmurs / rubs / gallops. No extremity edema. 2+ pedal pulses. Abdomen: Generalized tenderness, no masses palpated. No hepatosplenomegaly. Bowel sounds positive.  Musculoskeletal: no clubbing / cyanosis. No joint deformity upper and lower extremities. Good ROM, no contractures. Normal muscle tone.  Skin: no rashes, lesions, ulcers. No induration Neurologic: CN 2-12 grossly intact. Sensation intact, moving all extremities equally.  Exam somewhat limited due to cooperation. Psychiatric: Confused and actively hallucinating.  Lethargic but easily awakens to voice and intermittently answers questions appropriately.   Labs on Admission: I have personally reviewed following labs and imaging studies  CBC: Recent Labs  Lab 05/03/20 1942  WBC 11.9*  NEUTROABS 9.5*  HGB 15.3  HCT 42.5  MCV 95.3  PLT 347*   Basic Metabolic Panel: Recent Labs  Lab 05/03/20 1942  NA 124*  K 2.8*  CL 90*  CO2 15*  GLUCOSE 162*  BUN 33*  CREATININE 1.75*  CALCIUM 6.8*   GFR: CrCl cannot be calculated (Unknown ideal weight.). Liver Function Tests: Recent Labs  Lab 05/03/20 1942  AST 36  ALT 18  ALKPHOS 64  BILITOT 2.1*  PROT 5.1*  ALBUMIN 2.4*   No results for input(s): LIPASE, AMYLASE in the last 168 hours. No results for input(s): AMMONIA in the last 168 hours. Coagulation Profile: No results for input(s): INR, PROTIME in the last 168 hours. Cardiac Enzymes: Recent Labs  Lab 05/03/20 1942  CKTOTAL 738*   BNP (last 3 results) No results for input(s): PROBNP in the last 8760 hours. HbA1C: No results for input(s): HGBA1C in the last 72 hours. CBG: Recent Labs  Lab 05/03/20 1931  GLUCAP 152*   Lipid  Profile: No results for input(s): CHOL, HDL, LDLCALC, TRIG, CHOLHDL, LDLDIRECT in the last 72 hours. Thyroid Function Tests: No results for input(s): TSH, T4TOTAL, FREET4, T3FREE, THYROIDAB in the last 72 hours. Anemia Panel: No results for input(s): VITAMINB12, FOLATE, FERRITIN, TIBC, IRON, RETICCTPCT in the last 72 hours. Urine analysis: No results found for: COLORURINE, APPEARANCEUR, LABSPEC, PHURINE, GLUCOSEU, HGBUR, BILIRUBINUR, KETONESUR, PROTEINUR, UROBILINOGEN, NITRITE, LEUKOCYTESUR  Radiological Exams on Admission: CT Head Wo Contrast  Result Date: 05/03/2020 CLINICAL DATA:  Seizure, nontraumatic (Age 82-40y) EXAM: CT HEAD WITHOUT CONTRAST TECHNIQUE: Contiguous axial images were obtained from the base of the skull through the vertex without intravenous contrast. COMPARISON:  None. FINDINGS: Brain: No evidence of acute infarction, hemorrhage, hydrocephalus, extra-axial collection or mass lesion/mass effect. Generalized cerebral and cerebellar atrophy which is age advanced. No evidence of pontine hypodensity on CT. Vascular: No hyperdense vessel or unexpected calcification. Skull: No fracture or focal lesion. Sinuses/Orbits: Trace mucosal thickening of the right ethmoid air cell. Paranasal sinuses are otherwise clear. Mastoid air cells are well aerated. The  orbits are unremarkable. Other: None. IMPRESSION: 1. No acute intracranial abnormality. 2. Generalized cerebral and cerebellar atrophy, age advanced. Electronically Signed   By: Keith Rake M.D.   On: 05/03/2020 20:26   DG Chest Portable 1 View  Result Date: 05/03/2020 CLINICAL DATA:  Altered mental status. EXAM: PORTABLE CHEST 1 VIEW COMPARISON:  None FINDINGS: The heart size and mediastinal contours are within normal limits. Both lungs are clear. The visualized skeletal structures are unremarkable. IMPRESSION: Normal exam. Electronically Signed   By: Lorriane Shire M.D.   On: 05/03/2020 20:06    EKG: Independently reviewed. Sinus  tachycardia, LVH, motion artifact, nonspecific ST changes throughout.  No prior for comparison.  Assessment/Plan Principal Problem:   Alcohol withdrawal seizure with delirium ([place]) Active Problems:   Hyponatremia   Hypokalemia   AKI (acute kidney injury) ([place])   Elevated CK   Heme positive stool   Thrombocytopenia ([place])  Shane Bailiff Sr. is a 34 y.o. male with medical history significant for alcohol use disorder who is admitted with acute alcohol withdrawal seizures and delirium tremens   Alcohol use disorder with acute withdrawal seizure and delirium tremens: Patient with reported long-term alcohol use disorder presenting with increasing confusion and apparent withdrawal seizure with active hallucinations.  He is currently protecting airway but extremely high risk for further decompensation. -Admit to stepdown unit on CIWA protocol -Start Librium taper -Low threshold to place on Precedex if needed -Seizure and fall precautions -Keep n.p.o. except for sips with meds at this time -Continue IV fluid resuscitation overnight -Continue multivitamin, thiamine, folic acid  Acute kidney injury: No prior labs available.  Likely prerenal from hypovolemia/dehydration and reported seizure-like activity. -Continue IV fluid hydration overnight and repeat labs in the morning  Melena/heme positive stool: Reported by patient significant other with heme positive stool in the ED.  Suspect gastritis/possible Mallory-Weiss tearing in setting of significant alcohol use and reported emesis.  Hemoglobin currently stable without current indication for transfusion. -Start IV Protonix 40 mg twice daily -Repeat CBC in a.m.  Hyponatremia: Likely secondary to alcohol use.  Continue IV NS @ 100 mL/hr overnight and repeat labs in the morning.  Hypokalemia: Replete via IV and add K to fluids.  Magnesium 2.4.  Continue to monitor.  Elevated CK: Likely due to reported seizure-like activity.  Continue IV fluid  hydration, recheck CK level in the morning, monitor strict intake and output.  Thrombocytopenia: Suspect secondary to alcohol use.  With apparent melena and heme positive stool as above.  Continue monitor and hold pharmacologic VTE prophylaxis at this time.  DVT prophylaxis: SCDs Code Status: Full code Family Communication: Discussed with patient's significant other at bedside Disposition Plan: From home, discharge pending adequate control of alcohol withdrawal Consults called: None Admission status:  Status is: Inpatient  Remains inpatient appropriate because:Persistent severe electrolyte disturbances, Altered mental status, Unsafe d/c plan, IV treatments appropriate due to intensity of illness or inability to take PO and Inpatient level of care appropriate due to severity of illness   Dispo: The patient is from: Home              Anticipated d/c is to: TBD pending management of alcohol withdrawal              Anticipated d/c date is: 3 days              Patient currently is not medically stable to d/c.  Zada Finders MD [facility]  If 7PM-7AM, please contact night-coverage [email or web address]  05/03/2020, 9:43 PM

## 2020-05-03 NOTE — ED Notes (Signed)
Haven't been able to get an accurate BP in last few hours as pt keeps moving arm around or bending it.

## 2020-05-04 ENCOUNTER — Inpatient Hospital Stay: Payer: Medicaid Other

## 2020-05-04 ENCOUNTER — Encounter: Payer: Self-pay | Admitting: Internal Medicine

## 2020-05-04 LAB — LIPASE, BLOOD: Lipase: 481 U/L — ABNORMAL HIGH (ref 11–51)

## 2020-05-04 LAB — CBC
HCT: 35.9 % — ABNORMAL LOW (ref 39.0–52.0)
Hemoglobin: 13 g/dL (ref 13.0–17.0)
MCH: 34.1 pg — ABNORMAL HIGH (ref 26.0–34.0)
MCHC: 36.2 g/dL — ABNORMAL HIGH (ref 30.0–36.0)
MCV: 94.2 fL (ref 80.0–100.0)
Platelets: 86 10*3/uL — ABNORMAL LOW (ref 150–400)
RBC: 3.81 MIL/uL — ABNORMAL LOW (ref 4.22–5.81)
RDW: 11.6 % (ref 11.5–15.5)
WBC: 10.1 10*3/uL (ref 4.0–10.5)
nRBC: 0 % (ref 0.0–0.2)

## 2020-05-04 LAB — COMPREHENSIVE METABOLIC PANEL
ALT: 20 U/L (ref 0–44)
AST: 52 U/L — ABNORMAL HIGH (ref 15–41)
Albumin: 2.4 g/dL — ABNORMAL LOW (ref 3.5–5.0)
Alkaline Phosphatase: 58 U/L (ref 38–126)
Anion gap: 14 (ref 5–15)
BUN: 37 mg/dL — ABNORMAL HIGH (ref 6–20)
CO2: 20 mmol/L — ABNORMAL LOW (ref 22–32)
Calcium: 7.2 mg/dL — ABNORMAL LOW (ref 8.9–10.3)
Chloride: 92 mmol/L — ABNORMAL LOW (ref 98–111)
Creatinine, Ser: 1.55 mg/dL — ABNORMAL HIGH (ref 0.61–1.24)
GFR calc Af Amer: >60 mL/min (ref >60)
GFR calc non Af Amer: 58 mL/min — ABNORMAL LOW (ref >60)
Glucose, Bld: 118 mg/dL — ABNORMAL HIGH (ref 70–99)
Potassium: 3 mmol/L — ABNORMAL LOW (ref 3.5–5.1)
Sodium: 126 mmol/L — ABNORMAL LOW (ref 135–145)
Total Bilirubin: 1.7 mg/dL — ABNORMAL HIGH (ref 0.3–1.2)
Total Protein: 5.2 g/dL — ABNORMAL LOW (ref 6.5–8.1)

## 2020-05-04 LAB — GLUCOSE, CAPILLARY: Glucose-Capillary: 121 mg/dL — ABNORMAL HIGH (ref 70–99)

## 2020-05-04 LAB — LACTIC ACID, PLASMA: Lactic Acid, Venous: 2.1 mmol/L (ref 0.5–1.9)

## 2020-05-04 LAB — PHOSPHORUS: Phosphorus: 4 mg/dL (ref 2.5–4.6)

## 2020-05-04 LAB — CK: Total CK: 1440 U/L — ABNORMAL HIGH (ref 49–397)

## 2020-05-04 LAB — MRSA PCR SCREENING: MRSA by PCR: NEGATIVE

## 2020-05-04 LAB — MAGNESIUM: Magnesium: 2.6 mg/dL — ABNORMAL HIGH (ref 1.7–2.4)

## 2020-05-04 LAB — HIV ANTIBODY (ROUTINE TESTING W REFLEX): HIV Screen 4th Generation wRfx: NONREACTIVE

## 2020-05-04 IMAGING — CT CT ABD-PELV W/ CM
2 of 4 series · 14 of 46 positions shown, 16 images · IV contrast (APPLIED)
Comparison: None

CLINICAL DATA: Abdominal distension; history of severe alcohol use
disorder/dependence

EXAM:
CT ABDOMEN AND PELVIS WITH CONTRAST
TECHNIQUE: Multidetector CT imaging of the abdomen and pelvis was performed
using the standard protocol following bolus administration of
intravenous contrast. Sagittal and coronal MPR images reconstructed
from axial data set.
CONTRAST:  100mL OMNIPAQUE IOHEXOL 300 MG/ML SOLN IV. Dilute oral
contrast.

[Series 2: axial st · axial · 0.68mm/px · z∈[-981,-541]mm · 11 of 104 slices shown, 13 images]
[im 8/104  soft-tissue]
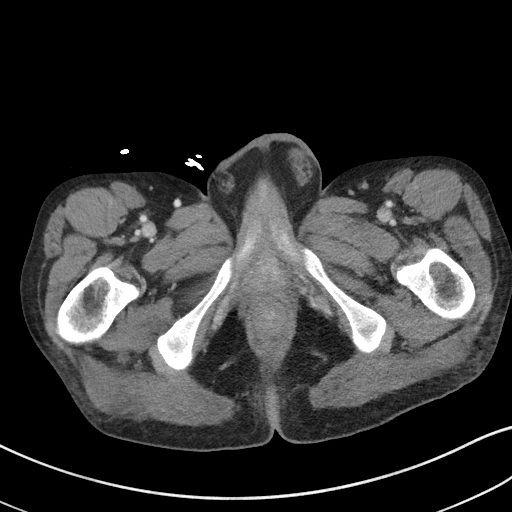
[im 8/104  bone]
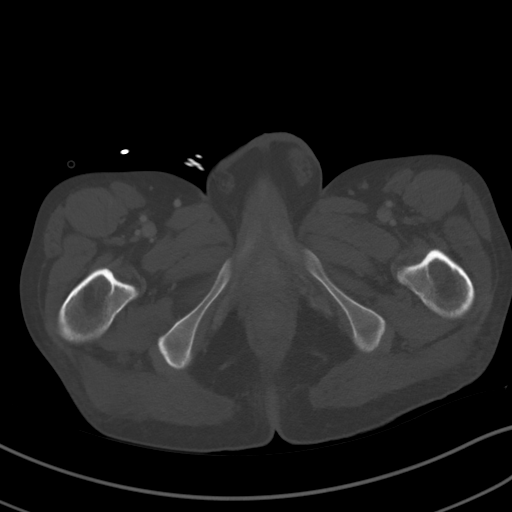
[im 16/104  soft-tissue]
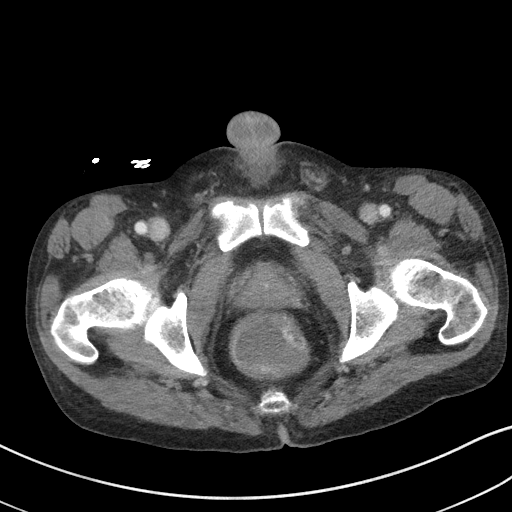
[im 24/104  soft-tissue]
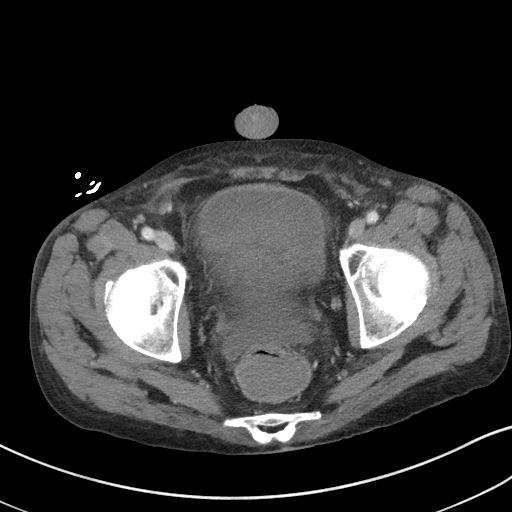
[im 32/104  soft-tissue]
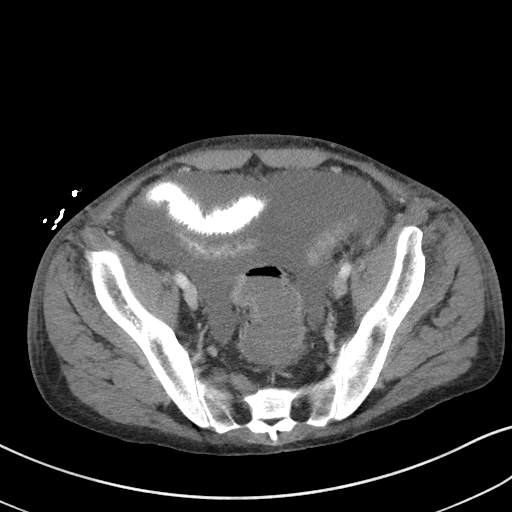
[im 44/104  soft-tissue]
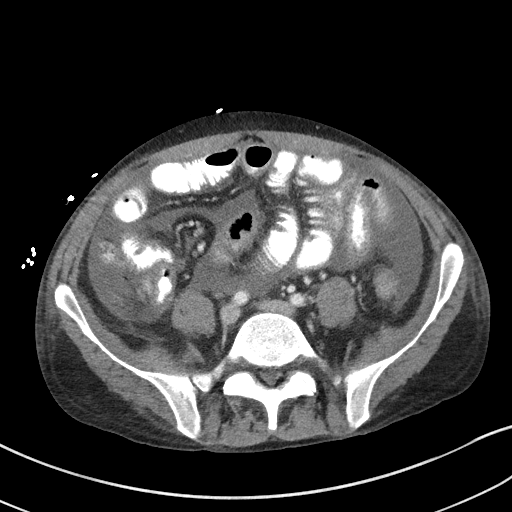
[im 52/104  soft-tissue]
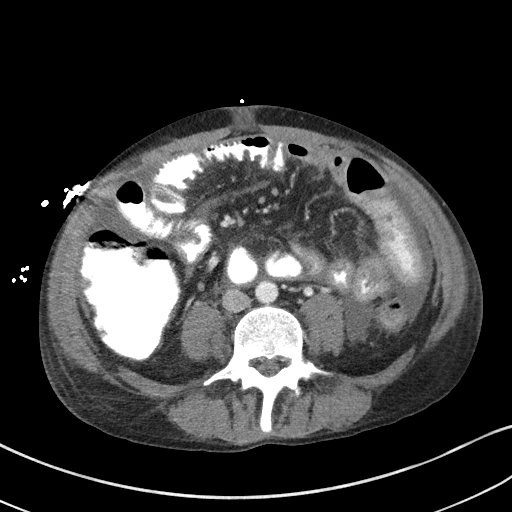
[im 60/104  soft-tissue]
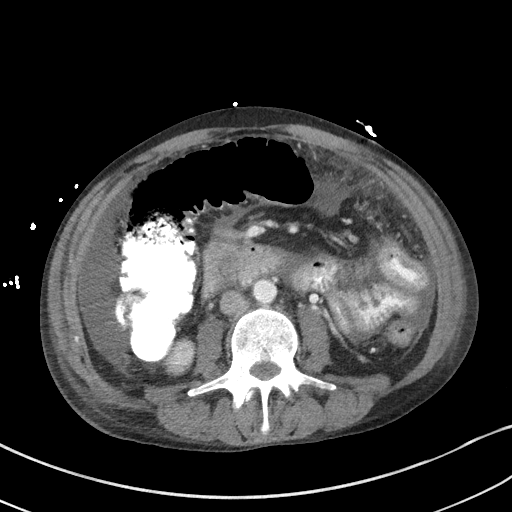
[im 72/104  soft-tissue]
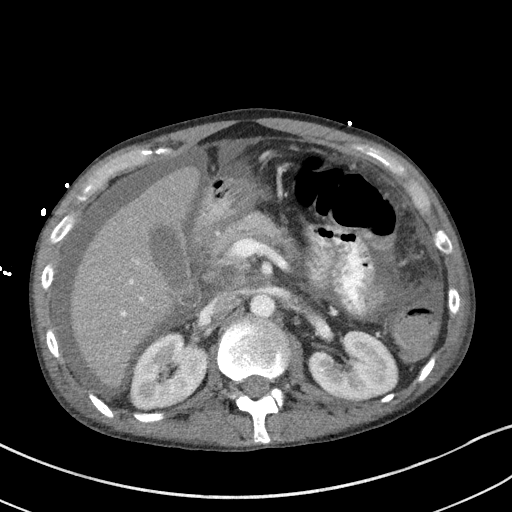
[im 80/104  soft-tissue]
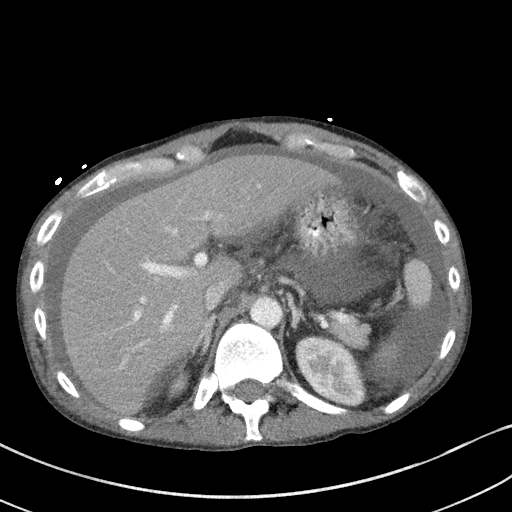
[im 80/104  bone]
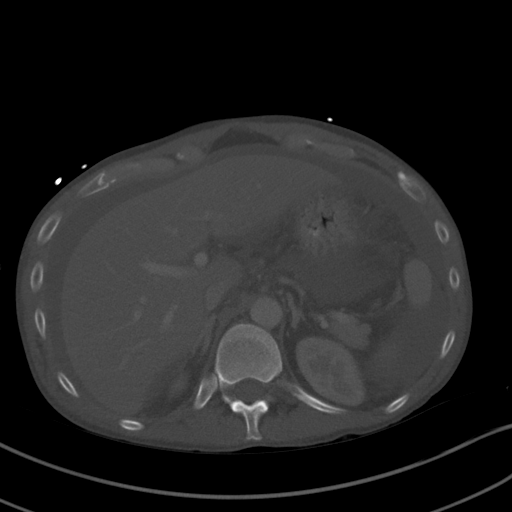
[im 88/104  soft-tissue]
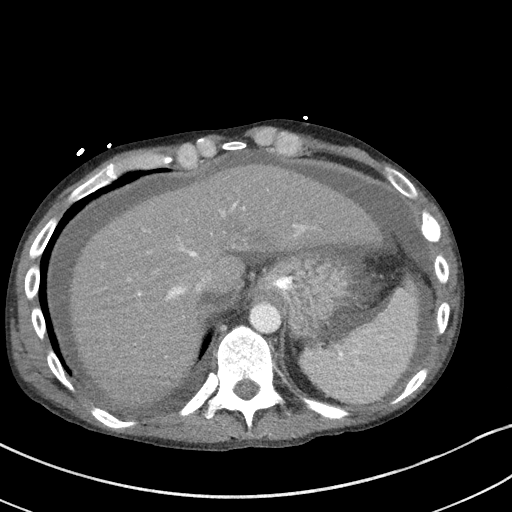
[im 96/104  soft-tissue]
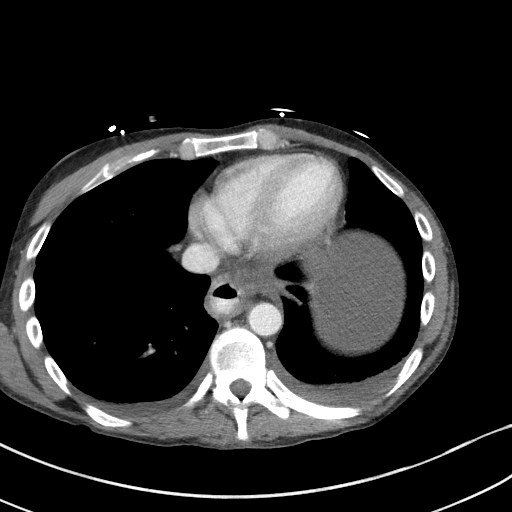

[Series 5: coronal st · coronal · 0.68mm/px · 3 of 78 slices shown]
[im 26/78  soft-tissue]
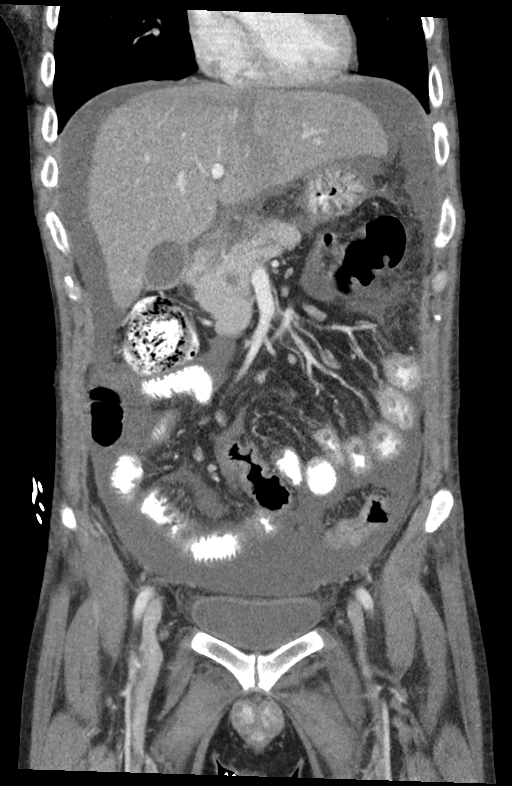
[im 35/78  soft-tissue]
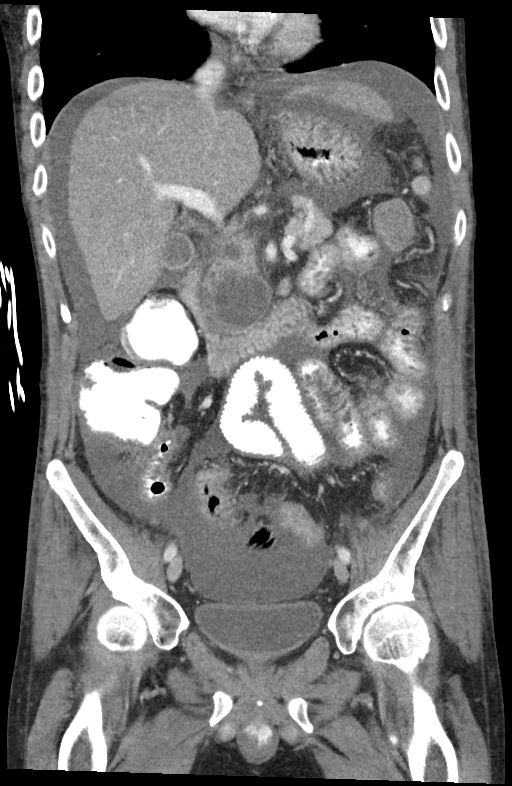
[im 43/78  soft-tissue]
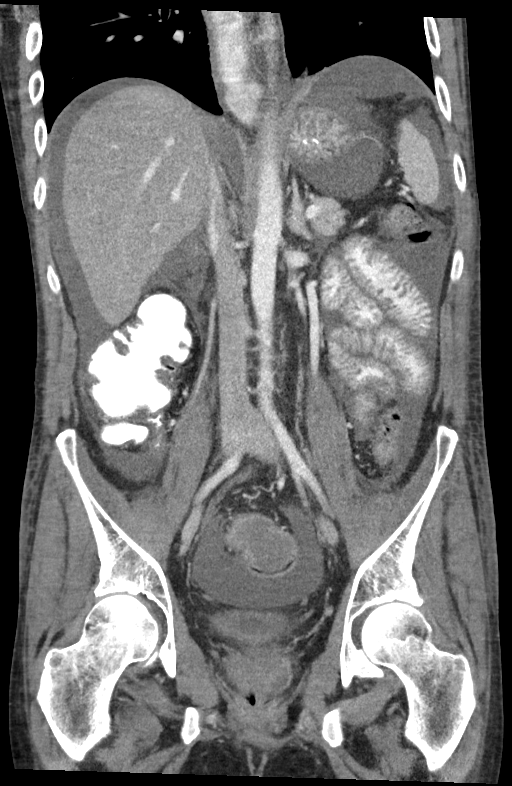

[14 of 46 positions shown; findings below may reference images not displayed]

FINDINGS: Lower chest: Small BILATERAL pleural effusions and minimal bibasilar
atelectasis

Hepatobiliary: Gallbladder unremarkable. Mild fatty infiltration of
liver.

Pancreas: Poorly defined low-attenuation focus at pancreatic body 14
x 12 x 12 mm image 31. Large cystic lesion at pancreatic
head/uncinate process 4.0 x 4.4 x 3.8 cm, containing a single
septation. Additional low-attenuation focus at pancreatic body 10 x
9 x 11 mm.

Spleen: Minimal peripancreatic infiltration. Findings could
represent pancreatitis involving the head and body of the pancreas,
without significant edema adjacent to pancreatic tail.

Adrenals/Urinary Tract: Normal appearance

Stomach/Bowel: Adrenal glands, kidneys, ureters, and bladder normal
appearance

Vascular/Lymphatic: Probable normal appendix. Small hiatal hernia.
Retained contrast in fluid in esophagus. Stomach decompressed.
Prominent small bowel loops with mild wall edema identified in the
mid abdomen and pelvis, nonspecific. No evidence of obstruction or
perforation. Probable mild rectal wall thickening and wall
hypervascularity.

Reproductive: Aorta normal caliber. Vascular structures patent.
Scattered normal size mesenteric and retroperitoneal lymph nodes
without definite adenopathy.

Other: Umbilical hernia containing fat. Moderate ascites. No free
air. BILATERAL inguinal hernias containing fat.

Musculoskeletal: Unremarkable
IMPRESSION: Edema adjacent to head and body of pancreas question pancreatitis;
recommend correlation with serum lipase/amylase levels.

Cystic lesions at the pancreas, largest at head 4.4 cm diameter,
could represent pseudocysts in a patient with pancreatitis or cystic
pancreatic neoplasms; recommend attention on follow-up imaging to
ensure resolution, or may characterize by MR.

Moderate ascites with mild fatty infiltration of liver.

Small BILATERAL pleural effusions and minimal bibasilar atelectasis.

Small hiatal hernia.

Umbilical and BILATERAL inguinal hernias.

Prominent small bowel loops with mild wall edema in the mid abdomen
and pelvis, nonspecific, could be related to enteritis or ascites.

Question mild rectal wall thickening and hypervascularity cannot
exclude proctitis.

Aortic Atherosclerosis ([JJ]-[JJ]).

## 2020-05-04 MED ORDER — HYDROMORPHONE HCL 1 MG/ML IJ SOLN
0.5000 mg | INTRAMUSCULAR | Status: DC | PRN
  Administered 2020-05-04 – 2020-05-26 (×39): 0.5 mg via INTRAVENOUS
  Filled 2020-05-04 (×39): qty 1

## 2020-05-04 MED ORDER — IOHEXOL 300 MG/ML  SOLN
100.0000 mL | Freq: Once | INTRAMUSCULAR | Status: AC | PRN
  Administered 2020-05-04: 100 mL via INTRAVENOUS

## 2020-05-04 MED ORDER — HYDROMORPHONE HCL 1 MG/ML IJ SOLN
INTRAMUSCULAR | Status: AC
  Filled 2020-05-04: qty 1

## 2020-05-04 MED ORDER — PNEUMOCOCCAL VAC POLYVALENT 25 MCG/0.5ML IJ INJ
0.5000 mL | INJECTION | INTRAMUSCULAR | Status: AC
  Administered 2020-05-07: 0.5 mL via INTRAMUSCULAR
  Filled 2020-05-04: qty 0.5

## 2020-05-04 MED ORDER — IOHEXOL 9 MG/ML PO SOLN
500.0000 mL | ORAL | Status: AC
  Administered 2020-05-04 (×2): 500 mL via ORAL

## 2020-05-04 MED ORDER — POTASSIUM CHLORIDE IN NACL 40-0.9 MEQ/L-% IV SOLN
INTRAVENOUS | Status: AC
  Filled 2020-05-04 (×3): qty 1000

## 2020-05-04 MED ORDER — LACTATED RINGERS IV BOLUS
2000.0000 mL | Freq: Once | INTRAVENOUS | Status: AC
  Administered 2020-05-04: 2000 mL via INTRAVENOUS

## 2020-05-04 NOTE — Progress Notes (Signed)
PROGRESS NOTE    Shane Ellithorpe Sr.  VZD:[id number] DOB: Aug 11, 1986 DOA: 05/03/2020 PCP: [name], MD    Brief Narrative:  34 year old gentleman with no medical history, chronic alcoholism, has been drinking nonstop about 12 packs of beer daily since age of 48 presented to the emergency room for evaluation of progressive confusion, lethargy.  He also had episode of unresponsiveness in the triage area.  As per patient's fianc, patient stopped drinking about 24 hours prior to arrival and started behaving altered, confused.  At the same time, patient has multiple episodes of loose watery stool, some of them dark appearing.  No nausea or vomiting. In the emergency room, he was sick looking.  Blood pressure is stable.  Tachycardic with heart rate 141.  Respiratory 21.  Afebrile.  100% on room air. Sodium 124, potassium 2.8, creatinine 1.75, bilirubin 2.  Anion gap 19.  CK 700.  Chest x-ray was essentially normal.  CT head was normal.  Patient suspected to have acute alcohol withdrawal and admitted to the hospital.   Assessment & Plan:   Principal Problem:   Alcohol withdrawal seizure with delirium (HCC) Active Problems:   Hyponatremia   Hypokalemia   AKI (acute kidney injury) (HCC)   Elevated CK   Heme positive stool   Thrombocytopenia (HCC)  Alcohol withdrawal seizure with delirium: Delirium tremens.  Alcoholism and alcohol use disorder. Continue monitoring in the intensive care unit.  Currently on tapering dose of Librium that we will continue.  Also on as needed benzodiazepine.  On high-dose multivitamin.  We will continue counseling as much as he is receptive.   Abdominal pain/diarrhea/hypotension and tachycardia: Suspect alcoholic pancreatitis.  Lipase is more than 400.  Patient with diarrhea and heme positive stool.  Hemoglobin is stable. CT scan to rule out acute peritonitis or perforated viscera. We will treat as alcoholic gastritis with PPI twice daily.  N.p.o. until  clinical improvement.  Ice chips allowed. Resuscitated further with 2 L isotonic saline and continue maintenance fluid.  Intake and output monitoring.  Will provide some pain medication. Due to significant symptoms and abdominal guarding, will do a CT scan of the abdomen pelvis with oral and IV contrast today.  Hypovolemic hyponatremia and acute kidney injury: Resuscitated further with isotonic fluid and monitor.  CT scan to look for any obstruction.  Hypokalemia: Replace aggressively and monitor levels.  Monitor magnesium and phosphorus.    DVT prophylaxis: SCDs Start: 05/03/20 2153   Code Status: Full code Family Communication: Patient's fianc at the bedside. Disposition Plan: Status is: Inpatient  Remains inpatient appropriate because:Persistent severe electrolyte disturbances, IV treatments appropriate due to intensity of illness or inability to take PO and Inpatient level of care appropriate due to severity of illness   Dispo: The patient is from: Home              Anticipated d/c is to: Home              Anticipated d/c date is: > 3 days              Patient currently is not medically stable to d/c.         Consultants:   None  Procedures:   None  Antimicrobials:   None   Subjective: Patient seen and examined.  Overnight events noted.  Patient is a still confused.  Tachycardic.  He himself denies any complaints, he has rigidity and guarding of the abdomen wall.   Objective: Vitals:   05/04/20  1100 05/04/20 1200 05/04/20 1300 05/04/20 1400  BP: (!) 129/91 (!) 168/115 (!) 181/124 (!) 181/123  Pulse: (!) 121 (!) 125 (!) 124 (!) 122  Resp: (!) 26 (!) 23 20 (!) 24  Temp:  98.6 F (37 C)    TempSrc:  Axillary    SpO2: 100% 100% 100% 100%  Weight:      Height:        Intake/Output Summary (Last 24 hours) at 05/04/2020 1435 Last data filed at 05/04/2020 1400 Gross per 24 hour  Intake 1240.96 ml  Output 270 ml  Net 970.96 ml   Filed Weights   05/03/20  2232  Weight: 63.5 kg    Examination:  General exam: Sick looking, tremulous and confused.  Mucous membranes are dry. Respiratory system: Clear to auscultation. Respiratory effort normal.  No added sound. Cardiovascular system: S1 & S2 heard, tachycardic. Gastrointestinal system: Abdomen is nondistended, diffuse tenderness and rigidity all over the abdomen.  Bowel sounds are sluggish. Central nervous system: Alert and awake on stimulation, oriented x2. Extremities: Symmetric 5 x 5 power.  Moves all extremities.   Data Reviewed: I have personally reviewed following labs and imaging studies  CBC: Recent Labs  Lab 05/03/20 1942 05/04/20 0533  WBC 11.9* 10.1  NEUTROABS 9.5*  --   HGB 15.3 13.0  HCT 42.5 35.9*  MCV 95.3 94.2  PLT 110* 86*   Basic Metabolic Panel: Recent Labs  Lab 05/03/20 1942 05/03/20 2131 05/04/20 0533  NA 124*  --  126*  K 2.8*  --  3.0*  CL 90*  --  92*  CO2 15*  --  20*  GLUCOSE 162*  --  118*  BUN 33*  --  37*  CREATININE 1.75*  --  1.55*  CALCIUM 6.8*  --  7.2*  MG  --  2.4 2.6*  PHOS  --   --  4.0   GFR: Estimated Creatinine Clearance: 60.3 mL/min (A) (by C-G formula based on SCr of 1.55 mg/dL (H)). Liver Function Tests: Recent Labs  Lab 05/03/20 1942 05/04/20 0533  AST 36 52*  ALT 18 20  ALKPHOS 64 58  BILITOT 2.1* 1.7*  PROT 5.1* 5.2*  ALBUMIN 2.4* 2.4*   Recent Labs  Lab 05/04/20 0533  LIPASE 481*   No results for input(s): AMMONIA in the last 168 hours. Coagulation Profile: Recent Labs  Lab 05/03/20 1942  INR 1.1   Cardiac Enzymes: Recent Labs  Lab 05/03/20 1942 05/04/20 0533  CKTOTAL 738* 1,440*   BNP (last 3 results) No results for input(s): PROBNP in the last 8760 hours. HbA1C: No results for input(s): HGBA1C in the last 72 hours. CBG: Recent Labs  Lab 05/03/20 1931 05/04/20 0004  GLUCAP 152* 121*   Lipid Profile: No results for input(s): CHOL, HDL, LDLCALC, TRIG, CHOLHDL, LDLDIRECT in the last 72  hours. Thyroid Function Tests: No results for input(s): TSH, T4TOTAL, FREET4, T3FREE, THYROIDAB in the last 72 hours. Anemia Panel: No results for input(s): VITAMINB12, FOLATE, FERRITIN, TIBC, IRON, RETICCTPCT in the last 72 hours. Sepsis Labs: Recent Labs  Lab 05/03/20 2131 05/04/20 0534  LATICACIDVEN 2.8* 2.1*    Recent Results (from the past 240 hour(s))  SARS Coronavirus 2 by RT PCR (hospital order, performed in [facility] hospital lab) Nasopharyngeal Nasopharyngeal Swab     Status: None   Collection Time: 05/03/20  8:25 PM   Specimen: Nasopharyngeal Swab  Result Value Ref Range Status   SARS Coronavirus 2 NEGATIVE NEGATIVE Final  Comment: (NOTE) SARS-CoV-2 target nucleic acids are NOT DETECTED.  The SARS-CoV-2 RNA is generally detectable in upper and lower respiratory specimens during the acute phase of infection. The lowest concentration of SARS-CoV-2 viral copies this assay can detect is 250 copies / mL. A negative result does not preclude SARS-CoV-2 infection and should not be used as the sole basis for treatment or other patient management decisions.  A negative result may occur with improper specimen collection / handling, submission of specimen other than nasopharyngeal swab, presence of viral mutation(s) within the areas targeted by this assay, and inadequate number of viral copies (<250 copies / mL). A negative result must be combined with clinical observations, patient history, and epidemiological information.  Fact Sheet for Patients:   BoilerBrush.com.cy  Fact Sheet for Healthcare Providers: https://pope.com/  This test is not yet approved or  cleared by the [place] FDA and has been authorized for detection and/or diagnosis of SARS-CoV-2 by FDA under an Emergency Use Authorization (EUA).  This EUA will remain in effect (meaning this test can be used) for the duration of the COVID-19 declaration under  Section 564(b)(1) of the Act, 21 U.S.C. section 360bbb-3(b)(1), unless the authorization is terminated or revoked sooner.  Performed at [facility], [address] [place], [place] [address]   MRSA PCR Screening     Status: None   Collection Time: 05/03/20 11:57 PM   Specimen: Nasopharyngeal  Result Value Ref Range Status   MRSA by PCR NEGATIVE NEGATIVE Final    Comment:        The GeneXpert MRSA Assay (FDA approved for NASAL specimens only), is one component of a comprehensive MRSA colonization surveillance program. It is not intended to diagnose MRSA infection nor to guide or monitor treatment for MRSA infections. Performed at [facility], [address] [place], [place] [address]          Radiology Studies: CT Head Wo Contrast  Result Date: 05/03/2020 CLINICAL DATA:  Seizure, nontraumatic (Age 18-40y) EXAM: CT HEAD WITHOUT CONTRAST TECHNIQUE: Contiguous axial images were obtained from the base of the skull through the vertex without intravenous contrast. COMPARISON:  None. FINDINGS: Brain: No evidence of acute infarction, hemorrhage, hydrocephalus, extra-axial collection or mass lesion/mass effect. Generalized cerebral and cerebellar atrophy which is age advanced. No evidence of pontine hypodensity on CT. Vascular: No hyperdense vessel or unexpected calcification. Skull: No fracture or focal lesion. Sinuses/Orbits: Trace mucosal thickening of the right ethmoid air cell. Paranasal sinuses are otherwise clear. Mastoid air cells are well aerated. The orbits are unremarkable. Other: None. IMPRESSION: 1. No acute intracranial abnormality. 2. Generalized cerebral and cerebellar atrophy, age advanced. Electronically Signed   By: Narda Rutherford M.D.   On: 05/03/2020 20:26   DG Chest Portable 1 View  Result Date: 05/03/2020 CLINICAL DATA:  Altered mental status. EXAM: PORTABLE CHEST 1 VIEW COMPARISON:  None FINDINGS: The heart size and mediastinal contours are  within normal limits. Both lungs are clear. The visualized skeletal structures are unremarkable. IMPRESSION: Normal exam. Electronically Signed   By: Francene Boyers M.D.   On: 05/03/2020 20:06        Scheduled Meds:  chlordiazePOXIDE  25 mg Oral QID   Followed by   Melene Muller ON 05/05/2020] chlordiazePOXIDE  25 mg Oral TID   Followed by   Melene Muller ON 05/06/2020] chlordiazePOXIDE  25 mg Oral BH-qamhs   Followed by   Melene Muller ON 05/07/2020] chlordiazePOXIDE  25 mg Oral Daily   Chlorhexidine Gluconate Cloth  6 each Topical Daily   folic acid  1 mg Oral Daily   multivitamin with minerals  1 tablet Oral Daily   pantoprazole (PROTONIX) IV  40 mg Intravenous Q12H   [START ON 05/05/2020] pneumococcal 23 valent vaccine  0.5 mL Intramuscular Tomorrow-1000   sodium chloride flush  3 mL Intravenous Q12H   thiamine  100 mg Oral Daily   Or   thiamine  100 mg Intravenous Daily   Continuous Infusions:  0.9 % NaCl with KCl 40 mEq / L       LOS: 1 day    Time spent: 35 minutes    Barb Merino, MD [facility] Pager [phone]

## 2020-05-04 NOTE — Progress Notes (Addendum)
Shift summary:  - No acute changes overnight per report. - VS stable and WNL. - BP elevated this afternoon, primarily due to pain. Md made aware and pain meds were ordered. - Patient transported to and from CT by this RN; no issues.

## 2020-05-04 NOTE — Plan of Care (Signed)

## 2020-05-05 LAB — COMPREHENSIVE METABOLIC PANEL
ALT: 24 U/L (ref 0–44)
AST: 58 U/L — ABNORMAL HIGH (ref 15–41)
Albumin: 2.2 g/dL — ABNORMAL LOW (ref 3.5–5.0)
Alkaline Phosphatase: 56 U/L (ref 38–126)
Anion gap: 9 (ref 5–15)
BUN: 19 mg/dL (ref 6–20)
CO2: 19 mmol/L — ABNORMAL LOW (ref 22–32)
Calcium: 7.1 mg/dL — ABNORMAL LOW (ref 8.9–10.3)
Chloride: 95 mmol/L — ABNORMAL LOW (ref 98–111)
Creatinine, Ser: 0.96 mg/dL (ref 0.61–1.24)
GFR calc Af Amer: >60 mL/min (ref >60)
GFR calc non Af Amer: >60 mL/min (ref >60)
Glucose, Bld: 142 mg/dL — ABNORMAL HIGH (ref 70–99)
Potassium: 4.3 mmol/L (ref 3.5–5.1)
Sodium: 123 mmol/L — ABNORMAL LOW (ref 135–145)
Total Bilirubin: 1.7 mg/dL — ABNORMAL HIGH (ref 0.3–1.2)
Total Protein: 4.8 g/dL — ABNORMAL LOW (ref 6.5–8.1)

## 2020-05-05 LAB — CBC WITH DIFFERENTIAL/PLATELET
Abs Immature Granulocytes: 0 10*3/uL (ref 0.00–0.07)
Band Neutrophils: 6 %
Basophils Absolute: 0 10*3/uL (ref 0.0–0.1)
Basophils Relative: 0 %
Eosinophils Absolute: 0.1 10*3/uL (ref 0.0–0.5)
Eosinophils Relative: 1 %
HCT: 33.6 % — ABNORMAL LOW (ref 39.0–52.0)
Hemoglobin: 11.9 g/dL — ABNORMAL LOW (ref 13.0–17.0)
Lymphocytes Relative: 3 %
Lymphs Abs: 0.3 10*3/uL — ABNORMAL LOW (ref 0.7–4.0)
MCH: 33.3 pg (ref 26.0–34.0)
MCHC: 35.4 g/dL (ref 30.0–36.0)
MCV: 94.1 fL (ref 80.0–100.0)
Monocytes Absolute: 2 10*3/uL — ABNORMAL HIGH (ref 0.1–1.0)
Monocytes Relative: 21 %
Neutro Abs: 7.3 10*3/uL (ref 1.7–7.7)
Neutrophils Relative %: 69 %
Platelets: 108 10*3/uL — ABNORMAL LOW (ref 150–400)
RBC: 3.57 MIL/uL — ABNORMAL LOW (ref 4.22–5.81)
RDW: 11.7 % (ref 11.5–15.5)
WBC: 9.7 10*3/uL (ref 4.0–10.5)
nRBC: 0 % (ref 0.0–0.2)

## 2020-05-05 LAB — PHOSPHORUS: Phosphorus: 2.5 mg/dL (ref 2.5–4.6)

## 2020-05-05 LAB — GLUCOSE, CAPILLARY
Glucose-Capillary: 114 mg/dL — ABNORMAL HIGH (ref 70–99)
Glucose-Capillary: 124 mg/dL — ABNORMAL HIGH (ref 70–99)
Glucose-Capillary: 141 mg/dL — ABNORMAL HIGH (ref 70–99)
Glucose-Capillary: 155 mg/dL — ABNORMAL HIGH (ref 70–99)

## 2020-05-05 LAB — MAGNESIUM: Magnesium: 2.1 mg/dL (ref 1.7–2.4)

## 2020-05-05 LAB — LIPASE, BLOOD: Lipase: 370 U/L — ABNORMAL HIGH (ref 11–51)

## 2020-05-05 MED ORDER — POTASSIUM CHLORIDE IN NACL 40-0.9 MEQ/L-% IV SOLN: INTRAVENOUS | Status: DC

## 2020-05-05 MED ORDER — SODIUM CHLORIDE 0.9 % IV SOLN: INTRAVENOUS | Status: DC

## 2020-05-05 NOTE — Progress Notes (Signed)
CH visited pt.'s rm. while rounding on ICU; pt. in bed, apparently asleep; fiance of 10yrs in chair at bedside.  Fiance shared pt. has been experiencing DTs and pancreatitis; medical team also found a cyst on pt.'s pancreas.  Fiance woke pt. up and let him know CH was present; pt. very drowsy from pain meds, acc. to fiance, but said he is in less pain now than when he was initially admitted to hospital.  Fiance says pt. has been drinking since he was 77; she thinks recent increased drinking may be related to the death of pt.'s father two months ago.  Pt. requested cup of ice water which CH obtained after getting RN permission.  No further needs at this time.  CH remains available as needed.    05/05/20 1145  Clinical Encounter Type  Visited With Patient and family together  Visit Type Initial;Psychological support;Social support;Critical Care  Referral From Other (Comment) (Routine Rounding)  Spiritual Encounters  Spiritual Needs Emotional  Stress Factors  Patient Stress Factors Loss;Health changes;Loss of control  Family Stress Factors Health changes

## 2020-05-05 NOTE — Plan of Care (Signed)

## 2020-05-05 NOTE — Progress Notes (Signed)
PROGRESS NOTE    Shane Paulino Sr.  ZOX:[id number] DOB: [birth year] DOA: 05/03/2020 PCP: Lorelee Market, MD    Brief Narrative:  34 year old gentleman with no medical history, chronic alcoholism, has been drinking nonstop about 12 packs of beer daily since age of 67 presented to the emergency room for evaluation of progressive confusion, lethargy.  He also had episode of unresponsiveness in the triage area.  As per patient's fianc, patient stopped drinking about 24 hours prior to arrival and started behaving altered, confused.  At the same time, patient has multiple episodes of loose watery stool, some of them dark appearing.  No nausea or vomiting. In the emergency room, he was sick looking.  Blood pressure is stable.  Tachycardic with heart rate 141.  Respiratory 21.  Afebrile.  100% on room air. Sodium 124, potassium was 2.8, creatinine 1.75, bilirubin 2.  Anion gap 19.  CK 700.  Chest x-ray was essentially normal.  CT head was normal.  Patient suspected to have acute alcohol withdrawal and admitted to the hospital.   Assessment & Plan:   Principal Problem:   Alcohol withdrawal seizure with delirium ([place]) Active Problems:   Hyponatremia   Hypokalemia   AKI (acute kidney injury) ([place])   Elevated CK   Heme positive stool   Thrombocytopenia ([facility])  Alcohol withdrawal seizure with delirium: Delirium tremens.  Alcoholism and alcohol use disorder. Currently on tapering dose of Librium that we will continue.  Also on as needed benzodiazepine.  On high-dose multivitamin.  Counseling when patient is more receptive. Will check vitamin W11 levels and folic acid levels.  With ongoing diarrhea, will also check Legionella antigen.  Abdominal pain/diarrhea/hypotension and tachycardia:  With alcoholic pancreatitis.  Also suspect alcohol induced gastritis.   Lipase was more than 400.  Patient with diarrhea and heme positive stool.  Hemoglobin is stable. 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  Patient probably had pancreatitis in the past gone unnoticed. We will treat as alcoholic gastritis with PPI twice daily.  N.p.o. until clinical improvement.  Intake and output monitoring.  Will provide adequate pain medications.  Hypovolemic hyponatremia: Continue isotonic fluid resuscitation.   Acute renal failure: Improved.  No obstruction.  Adequate urine output.   Hypokalemia: Replaced with improvement.  Magnesium and phosphorus improved.   DVT prophylaxis: SCDs Start: 05/03/20 2153   Code Status: Full code Family Communication: Patient's fianc at bedside. Disposition Plan: Status is: Inpatient  Remains inpatient appropriate because:Persistent severe electrolyte disturbances, IV treatments appropriate due to intensity of illness or inability to take PO and Inpatient level of care appropriate due to severity of illness   Dispo: The patient is from: Home              Anticipated d/c is to: Home              Anticipated d/c date is: > 3 days              Patient currently is not medically stable to d/c.         Consultants:   None  Procedures:   None  Antimicrobials:   None   Subjective: Patient seen and examined.  Overnight remained tachycardic.  He was having much abdominal pain and was just given Dilaudid earlier today.  He is a still not able to participate fully in conversation.  Tired and lethargic.  Denies any nausea vomiting. His wife reported that he has been walking with holding on some support for  at least a month now.   Objective: Vitals:   05/05/20 0800 05/05/20 0900 05/05/20 1000 05/05/20 1100  BP: (!) 138/102 (!) 127/100 (!) 124/94 121/86  Pulse: (!) 134  (!) 132 (!) 133  Resp: 18 (!) 21 12 12   Temp:      TempSrc:      SpO2: 98%  97% 97%  Weight:      Height:        Intake/Output Summary (Last 24 hours) at 05/05/2020 1152 Last data filed at 05/05/2020 0800 Gross per 24 hour  Intake 3398.57 ml  Output  1045 ml  Net 2353.57 ml   Filed Weights   05/03/20 2232  Weight: 63.5 kg    Examination: Physical Exam Constitutional:      Comments: Sick looking, anxious and lethargic.  HENT:     Head: Normocephalic.  Eyes:     Pupils: Pupils are equal, round, and reactive to light.  Cardiovascular:     Rate and Rhythm: Regular rhythm. Tachycardia present.  Pulmonary:     Effort: Pulmonary effort is normal.  Abdominal:     General: Abdomen is flat.     Comments: Generalized tenderness with voluntary guarding.  Bowel sounds present.  Skin:    General: Skin is warm.  Neurological:     General: No focal deficit present.     Comments: Lethargic      Data Reviewed: I have personally reviewed following labs and imaging studies  CBC: Recent Labs  Lab 05/03/20 1942 05/04/20 0533 05/05/20 0507  WBC 11.9* 10.1 9.7  NEUTROABS 9.5*  --  7.3  HGB 15.3 13.0 11.9*  HCT 42.5 35.9* 33.6*  MCV 95.3 94.2 94.1  PLT 110* 86* 108*   Basic Metabolic Panel: Recent Labs  Lab 05/03/20 1942 05/03/20 2131 05/04/20 0533 05/05/20 0507  NA 124*  --  126* 123*  K 2.8*  --  3.0* 4.3  CL 90*  --  92* 95*  CO2 15*  --  20* 19*  GLUCOSE 162*  --  118* 142*  BUN 33*  --  37* 19  CREATININE 1.75*  --  1.55* 0.96  CALCIUM 6.8*  --  7.2* 7.1*  MG  --  2.4 2.6* 2.1  PHOS  --   --  4.0 2.5   GFR: Estimated Creatinine Clearance: 97.4 mL/min (by C-G formula based on SCr of 0.96 mg/dL). Liver Function Tests: Recent Labs  Lab 05/03/20 1942 05/04/20 0533 05/05/20 0507  AST 36 52* 58*  ALT 18 20 24   ALKPHOS 64 58 56  BILITOT 2.1* 1.7* 1.7*  PROT 5.1* 5.2* 4.8*  ALBUMIN 2.4* 2.4* 2.2*   Recent Labs  Lab 05/04/20 0533 05/05/20 0507  LIPASE 481* 370*   No results for input(s): AMMONIA in the last 168 hours. Coagulation Profile: Recent Labs  Lab 05/03/20 1942  INR 1.1   Cardiac Enzymes: Recent Labs  Lab 05/03/20 1942 05/04/20 0533  CKTOTAL 738* 1,440*   BNP (last 3 results) No  results for input(s): PROBNP in the last 8760 hours. HbA1C: No results for input(s): HGBA1C in the last 72 hours. CBG: Recent Labs  Lab 05/03/20 1931 05/04/20 0004 05/05/20 1149  GLUCAP 152* 121* 141*   Lipid Profile: No results for input(s): CHOL, HDL, LDLCALC, TRIG, CHOLHDL, LDLDIRECT in the last 72 hours. Thyroid Function Tests: No results for input(s): TSH, T4TOTAL, FREET4, T3FREE, THYROIDAB in the last 72 hours. Anemia Panel: No results for input(s): VITAMINB12, FOLATE, FERRITIN, TIBC, IRON, RETICCTPCT in  the last 72 hours. Sepsis Labs: Recent Labs  Lab 05/03/20 2131 05/04/20 0534  LATICACIDVEN 2.8* 2.1*    Recent Results (from the past 240 hour(s))  SARS Coronavirus 2 by RT PCR (hospital order, performed in [facility] hospital lab) Nasopharyngeal Nasopharyngeal Swab     Status: None   Collection Time: 05/03/20  8:25 PM   Specimen: Nasopharyngeal Swab  Result Value Ref Range Status   SARS Coronavirus 2 NEGATIVE NEGATIVE Final    Comment: (NOTE) SARS-CoV-2 target nucleic acids are NOT DETECTED.  The SARS-CoV-2 RNA is generally detectable in upper and lower respiratory specimens during the acute phase of infection. The lowest concentration of SARS-CoV-2 viral copies this assay can detect is 250 copies / mL. A negative result does not preclude SARS-CoV-2 infection and should not be used as the sole basis for treatment or other patient management decisions.  A negative result may occur with improper specimen collection / handling, submission of specimen other than nasopharyngeal swab, presence of viral mutation(s) within the areas targeted by this assay, and inadequate number of viral copies (<250 copies / mL). A negative result must be combined with clinical observations, patient history, and epidemiological information.  Fact Sheet for Patients:   BoilerBrush.com.cy  Fact Sheet for Healthcare  Providers: https://pope.com/  This test is not yet approved or  cleared by the [place] FDA and has been authorized for detection and/or diagnosis of SARS-CoV-2 by FDA under an Emergency Use Authorization (EUA).  This EUA will remain in effect (meaning this test can be used) for the duration of the COVID-19 declaration under Section 564(b)(1) of the Act, 21 U.S.C. section 360bbb-3(b)(1), unless the authorization is terminated or revoked sooner.  Performed at [facility], [address] [place], [place] [address]   MRSA PCR Screening     Status: None   Collection Time: 05/03/20 11:57 PM   Specimen: Nasopharyngeal  Result Value Ref Range Status   MRSA by PCR NEGATIVE NEGATIVE Final    Comment:        The GeneXpert MRSA Assay (FDA approved for NASAL specimens only), is one component of a comprehensive MRSA colonization surveillance program. It is not intended to diagnose MRSA infection nor to guide or monitor treatment for MRSA infections. Performed at [facility], [address] [place], [place] [address]          Radiology Studies: CT Head Wo Contrast  Result Date: 05/03/2020 CLINICAL DATA:  Seizure, nontraumatic (Age 18-40y) EXAM: CT HEAD WITHOUT CONTRAST TECHNIQUE: Contiguous axial images were obtained from the base of the skull through the vertex without intravenous contrast. COMPARISON:  None. FINDINGS: Brain: No evidence of acute infarction, hemorrhage, hydrocephalus, extra-axial collection or mass lesion/mass effect. Generalized cerebral and cerebellar atrophy which is age advanced. No evidence of pontine hypodensity on CT. Vascular: No hyperdense vessel or unexpected calcification. Skull: No fracture or focal lesion. Sinuses/Orbits: Trace mucosal thickening of the right ethmoid air cell. Paranasal sinuses are otherwise clear. Mastoid air cells are well aerated. The orbits are unremarkable. Other: None. IMPRESSION: 1.  No acute intracranial abnormality. 2. Generalized cerebral and cerebellar atrophy, age advanced. Electronically Signed   By: Narda Rutherford M.D.   On: 05/03/2020 20:26   CT ABDOMEN PELVIS W CONTRAST  Result Date: 05/04/2020 CLINICAL DATA:  Abdominal distension; history of severe alcohol use disorder/dependence EXAM: CT ABDOMEN AND PELVIS WITH CONTRAST TECHNIQUE: Multidetector CT imaging of the abdomen and pelvis was performed using the standard protocol following bolus administration of  intravenous contrast. Sagittal and coronal MPR images reconstructed from axial data set. CONTRAST:  OMNIPAQUE IOHEXOL 300 MG/ML SOLN IV. Dilute oral contrast. COMPARISON:  None FINDINGS: Lower chest: Small BILATERAL pleural effusions and minimal bibasilar atelectasis Hepatobiliary: Gallbladder unremarkable. Mild fatty infiltration of liver. Pancreas: Poorly defined low-attenuation focus at pancreatic body 14 x 12 x 12 mm image 31. Large cystic lesion at pancreatic head/uncinate process 4.0 x 4.4 x 3.8 cm, containing a single septation. Additional low-attenuation focus at pancreatic body 10 x 9 x 11 mm. Spleen: Minimal peripancreatic infiltration. Findings could represent pancreatitis involving the head and body of the pancreas, without significant edema adjacent to pancreatic tail. Adrenals/Urinary Tract: Normal appearance Stomach/Bowel: Adrenal glands, kidneys, ureters, and bladder normal appearance Vascular/Lymphatic: Probable normal appendix. Small hiatal hernia. Retained contrast in fluid in esophagus. Stomach decompressed. Prominent small bowel loops with mild wall edema identified in the mid abdomen and pelvis, nonspecific. No evidence of obstruction or perforation. Probable mild rectal wall thickening and wall hypervascularity. Reproductive: Aorta normal caliber. Vascular structures patent. Scattered normal size mesenteric and retroperitoneal lymph nodes without definite adenopathy. Other: Umbilical hernia  containing fat. Moderate ascites. No free air. BILATERAL inguinal hernias containing fat. Musculoskeletal: Unremarkable IMPRESSION: Edema adjacent to head and body of pancreas question pancreatitis; recommend correlation with serum lipase/amylase levels. Cystic lesions at the pancreas, largest at head 4.4 cm diameter, could represent pseudocysts in a patient with pancreatitis or cystic pancreatic neoplasms; recommend attention on follow-up imaging to ensure resolution, or may characterize by MR. Moderate ascites with mild fatty infiltration of liver. Small BILATERAL pleural effusions and minimal bibasilar atelectasis. Small hiatal hernia. Umbilical and BILATERAL inguinal hernias. Prominent small bowel loops with mild wall edema in the mid abdomen and pelvis, nonspecific, could be related to enteritis or ascites. Question mild rectal wall thickening and hypervascularity cannot exclude proctitis. Aortic Atherosclerosis (ICD10-I70.0). Electronically Signed   By: Ulyses Southward M.D.   On: 05/04/2020 15:50   DG Chest Portable 1 View  Result Date: 05/03/2020 CLINICAL DATA:  Altered mental status. EXAM: PORTABLE CHEST 1 VIEW COMPARISON:  None FINDINGS: The heart size and mediastinal contours are within normal limits. Both lungs are clear. The visualized skeletal structures are unremarkable. IMPRESSION: Normal exam. Electronically Signed   By: Francene Boyers M.D.   On: 05/03/2020 20:06        Scheduled Meds: . chlordiazePOXIDE  25 mg Oral QID   Followed by  . chlordiazePOXIDE  25 mg Oral TID   Followed by  . [START ON 05/06/2020] chlordiazePOXIDE  25 mg Oral BH-qamhs   Followed by  . [START ON 05/07/2020] chlordiazePOXIDE  25 mg Oral Daily  . Chlorhexidine Gluconate Cloth  6 each Topical Daily  . folic acid  1 mg Oral Daily  . multivitamin with minerals  1 tablet Oral Daily  . pantoprazole (PROTONIX) IV  40 mg Intravenous Q12H  . pneumococcal 23 valent vaccine  0.5 mL Intramuscular Tomorrow-1000  . sodium  chloride flush  3 mL Intravenous Q12H  . thiamine  100 mg Oral Daily   Or  . thiamine  100 mg Intravenous Daily   Continuous Infusions: . 0.9 % NaCl with KCl 40 mEq / L       LOS: 2 days    Time spent: 30 minutes    Dorcas Carrow, MD [facility] Pager [phone]

## 2020-05-05 NOTE — Progress Notes (Signed)
Shift summary:  - Report received from Melvenia Needles, Charity fundraiser.

## 2020-05-06 ENCOUNTER — Inpatient Hospital Stay: Payer: Medicaid Other

## 2020-05-06 LAB — COMPREHENSIVE METABOLIC PANEL WITH GFR
ALT: 23 U/L (ref 0–44)
AST: 43 U/L — ABNORMAL HIGH (ref 15–41)
Albumin: 2 g/dL — ABNORMAL LOW (ref 3.5–5.0)
Alkaline Phosphatase: 60 U/L (ref 38–126)
Anion gap: 10 (ref 5–15)
BUN: 15 mg/dL (ref 6–20)
CO2: 22 mmol/L (ref 22–32)
Calcium: 7.6 mg/dL — ABNORMAL LOW (ref 8.9–10.3)
Chloride: 93 mmol/L — ABNORMAL LOW (ref 98–111)
Creatinine, Ser: 0.93 mg/dL (ref 0.61–1.24)
GFR calc Af Amer: >60 mL/min
GFR calc non Af Amer: >60 mL/min
Glucose, Bld: 125 mg/dL — ABNORMAL HIGH (ref 70–99)
Potassium: 4.7 mmol/L (ref 3.5–5.1)
Sodium: 125 mmol/L — ABNORMAL LOW (ref 135–145)
Total Bilirubin: 2.1 mg/dL — ABNORMAL HIGH (ref 0.3–1.2)
Total Protein: 5 g/dL — ABNORMAL LOW (ref 6.5–8.1)

## 2020-05-06 LAB — GLUCOSE, CAPILLARY
Glucose-Capillary: 118 mg/dL — ABNORMAL HIGH (ref 70–99)
Glucose-Capillary: 103 mg/dL — ABNORMAL HIGH (ref 70–99)
Glucose-Capillary: 103 mg/dL — ABNORMAL HIGH (ref 70–99)
Glucose-Capillary: 74 mg/dL (ref 70–99)
Glucose-Capillary: 102 mg/dL — ABNORMAL HIGH (ref 70–99)

## 2020-05-06 LAB — LIPASE, BLOOD: Lipase: 463 U/L — ABNORMAL HIGH (ref 11–51)

## 2020-05-06 LAB — FOLATE: Folate: 8.3 ng/mL (ref >5.9)

## 2020-05-06 LAB — VITAMIN B12: Vitamin B-12: 505 pg/mL (ref 180–914)

## 2020-05-06 IMAGING — DX DG ABDOMEN 1V
1 series · 1 of 1 positions shown · non-contrast
Comparison: [DATE]

CLINICAL DATA: Nasogastric tube placement

EXAM:
ABDOMEN - 1 VIEW

[abdomen supine]
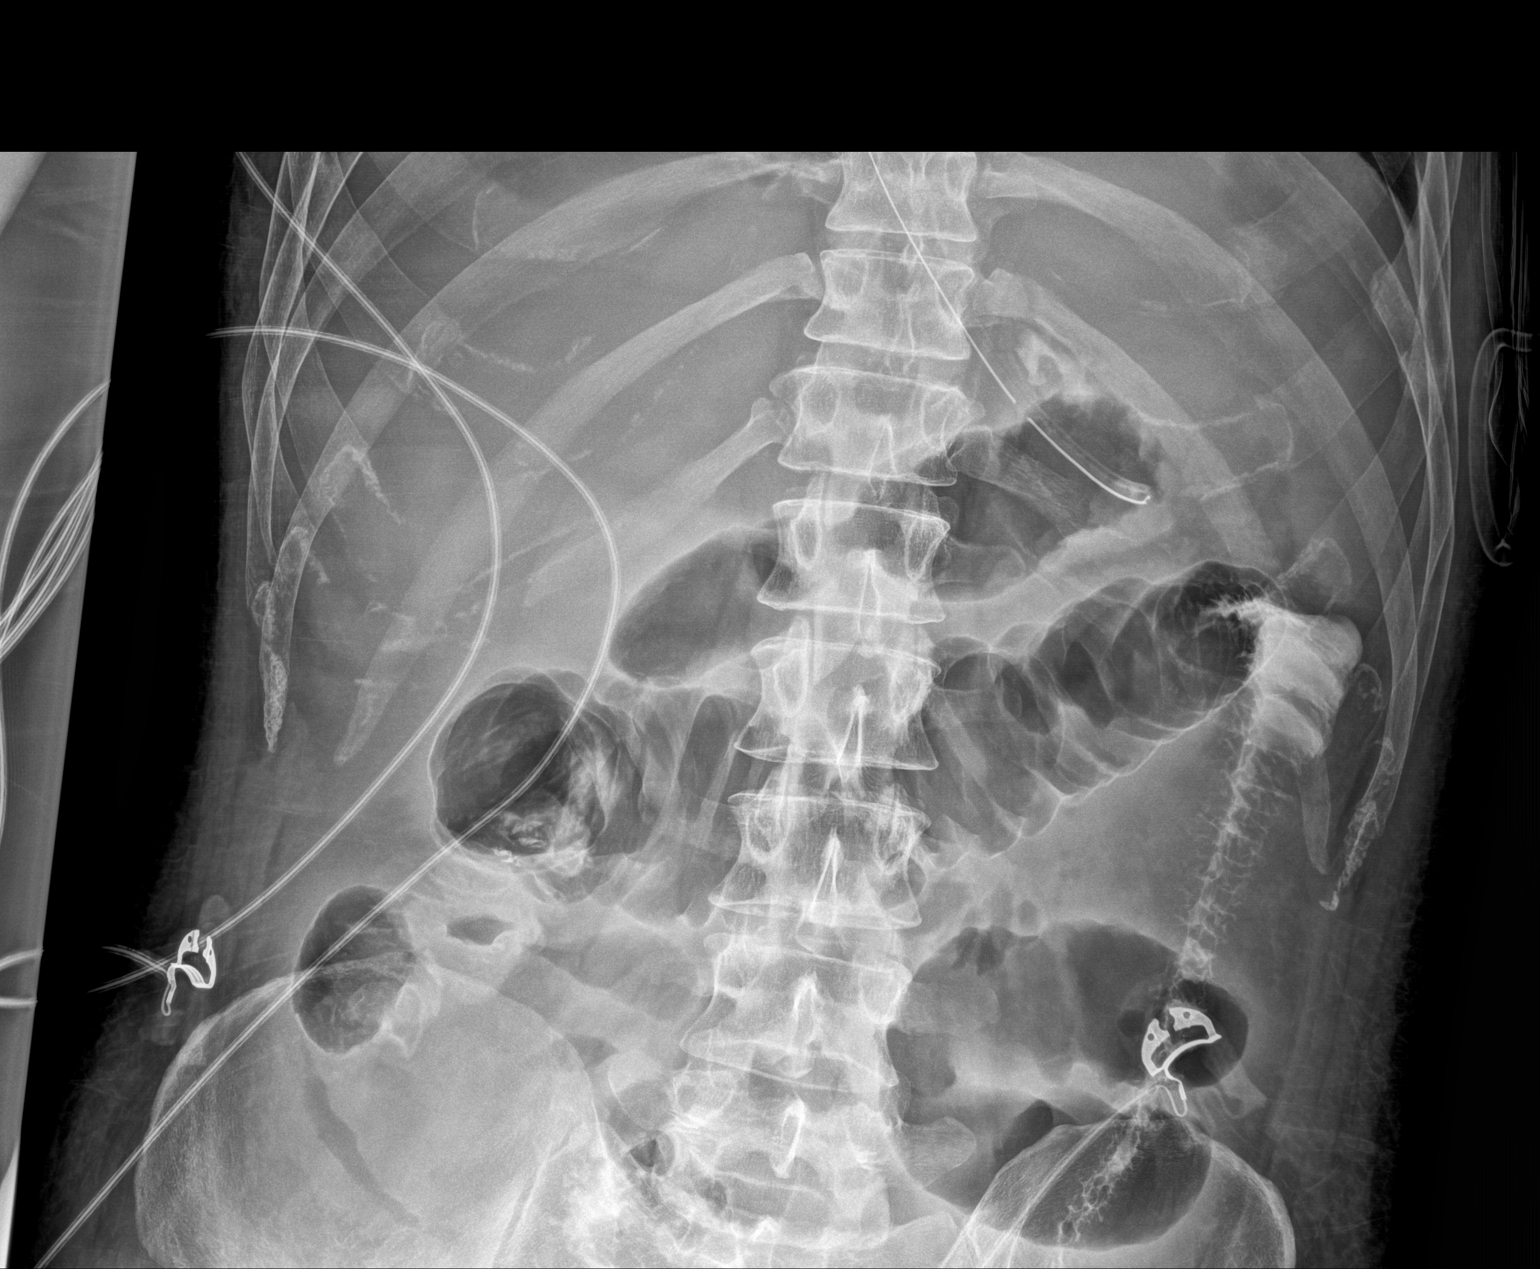

[1 of 1 positions shown; findings below may reference images not displayed]

FINDINGS: The enteric tube projects over the stomach. The bowel gas pattern is
nonspecific with mildly dilated loops of small bowel and colon
scattered throughout the abdomen. Oral contrast is noted within the
colon. There is no pneumatosis or free air.
IMPRESSION: 1. Enteric tube projects over the stomach.
2. Mildly dilated loops of small bowel and colon scattered
throughout the abdomen.

## 2020-05-06 IMAGING — DX DG ABDOMEN 1V
1 series · 1 of 1 positions shown · non-contrast
Comparison: Portable exam [BW] hours compared to [BW] hours

CLINICAL DATA: Nasogastric tube placement

EXAM:
ABDOMEN - 1 VIEW

[abdomen supine]
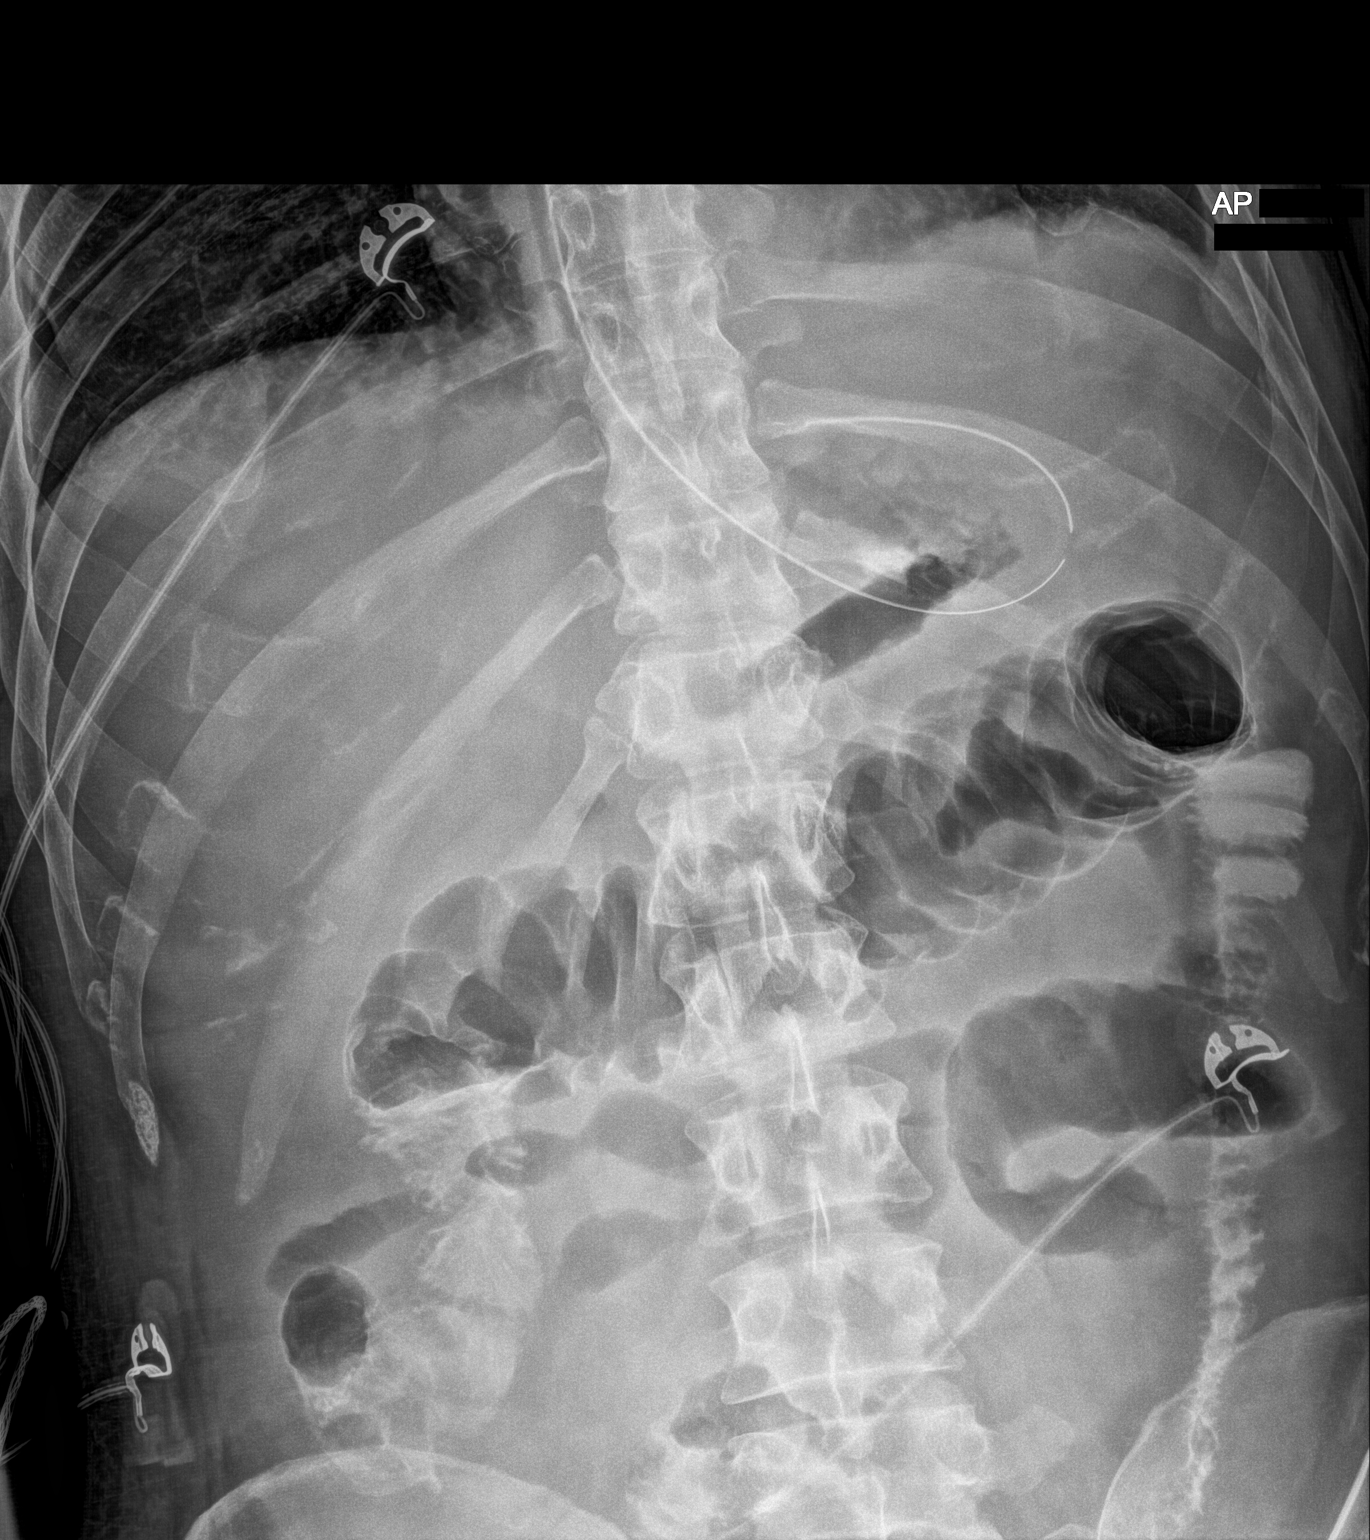

[1 of 1 positions shown; findings below may reference images not displayed]

FINDINGS: Nasogastric tube coiled in proximal stomach.

Nonobstructive bowel gas pattern.

Single nonspecific minimally prominent loop of small bowel in the
LEFT mid abdomen.

Osseous structures unremarkable.
IMPRESSION: Nasogastric tube coiled in proximal stomach.

## 2020-05-06 IMAGING — DX DG ABDOMEN 1V
2 series · 2 of 2 positions shown · non-contrast
Comparison: CT scan [DATE]

CLINICAL DATA: Abdominal distension.

EXAM:
ABDOMEN - 1 VIEW

[abdomen supine (1 of 2)]
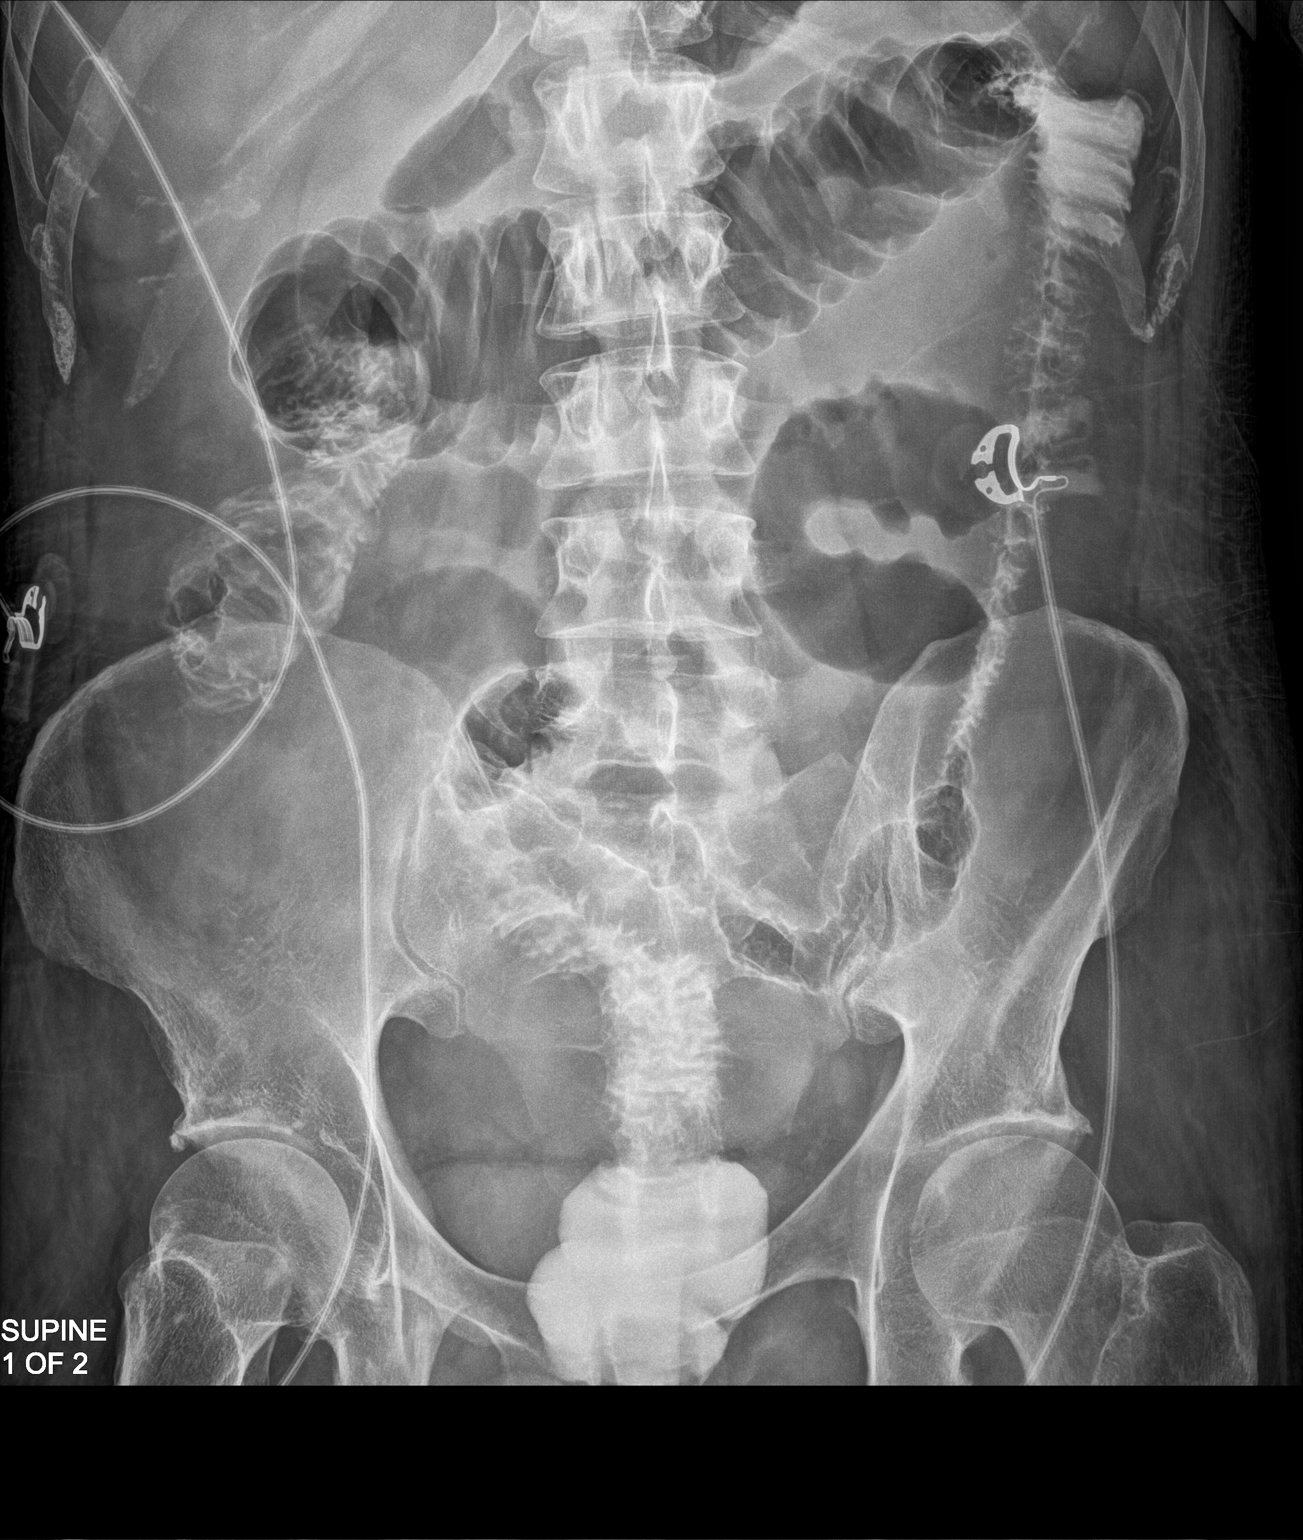

[abdomen supine (2 of 2)]
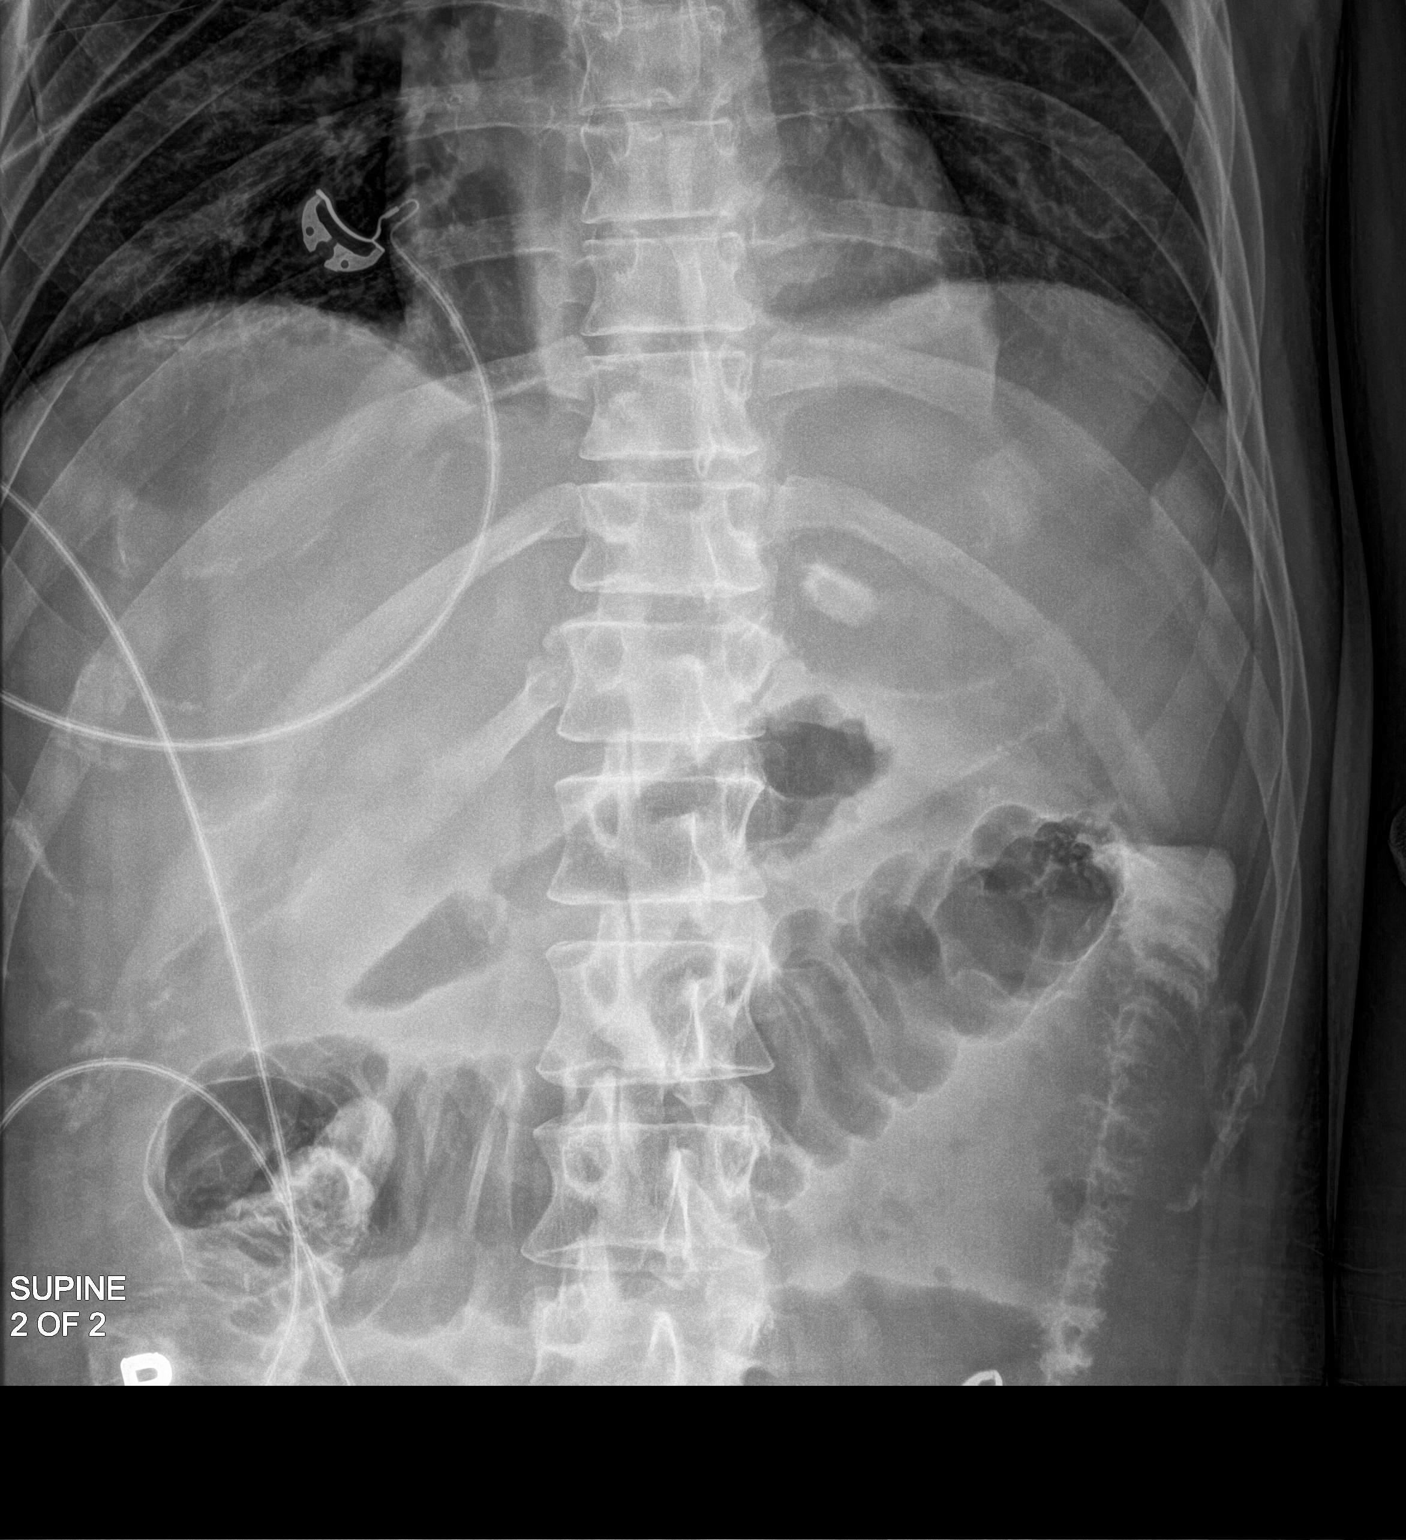

[2 of 2 positions shown; findings below may reference images not displayed]

FINDINGS: There is contrast in the colon extending to the level the rectum. No
small bowel dilatation. No free air, portal venous gas, or
pneumatosis. No other acute abnormalities are identified.
IMPRESSION: No evidence of bowel obstruction. No cause for bowel a distension
identified on this study.

## 2020-05-06 MED ORDER — CHLORDIAZEPOXIDE HCL 25 MG PO CAPS
25.0000 mg | ORAL_CAPSULE | Freq: Every day | ORAL | Status: AC
  Filled 2020-05-06: qty 1

## 2020-05-06 MED ORDER — CHLORDIAZEPOXIDE HCL 25 MG PO CAPS
25.0000 mg | ORAL_CAPSULE | ORAL | Status: AC
  Administered 2020-05-06 – 2020-05-07 (×2): 25 mg

## 2020-05-06 NOTE — Plan of Care (Signed)

## 2020-05-06 NOTE — Progress Notes (Addendum)
Shift summary:  - Report received from Melvenia Needles, RN. - Patient's level of care downgraded to Progressive care this morning, but ultimately reverted back to SDU and remained in CCU.  - Remains in ST with stable BP.  - Foley catheter and NGT placed by this RN this shift. - Trending IAP q 6 hrs, pressure @ 20 mmHg upon first measurement.

## 2020-05-06 NOTE — Progress Notes (Signed)
SURGICAL FOLLOW UP NOTE   Hospital Day(s): 3.   Post op day(s):  Marland Kitchen   Interval History: Patient seen and examined, no acute events since last evaluation. Patient now more alert than during my first evaluation. New intra vesical pressure is 16, improved from 20.   Vital signs in last 24 hours: [min-max] current  Temp:  [98.1 F (36.7 C)-99 F (37.2 C)] 98.8 F (37.1 C) (06/27 1944) Pulse Rate:  [107-128] 122 (06/27 2200) Resp:  [10-27] 13 (06/27 2200) BP: (132-174)/(96-127) 156/107 (06/27 2200) SpO2:  [95 %-100 %] 99 % (06/27 2200)     Height: 6\' 1"  (185.4 cm) Weight: 63.5 kg BMI (Calculated): 18.47   Physical Exam:  Constitutional: alert, cooperative and no distress  Respiratory: breathing non-labored at rest  Cardiovascular: tachycardic Gastrointestinal: soft, non-tender, and distended  Labs:  CBC Latest Ref Rng & Units 05/05/2020 05/04/2020 05/03/2020  WBC 4.0 - 10.5 K/uL 9.7 10.1 11.9(H)  Hemoglobin 13.0 - 17.0 g/dL 11.9(L) 13.0 15.3  Hematocrit 39 - 52 % 33.6(L) 35.9(L) 42.5  Platelets 150 - 400 K/uL 108(L) 86(L) 110(L)   CMP Latest Ref Rng & Units 05/06/2020 05/05/2020 05/04/2020  Glucose 70 - 99 mg/dL 05/06/2020) 086(P) 619(J)  BUN 6 - 20 mg/dL 15 19 093(O)  Creatinine 0.61 - 1.24 mg/dL 67(T 2.45 8.09)  Sodium 135 - 145 mmol/L 125(L) 123(L) 126(L)  Potassium 3.5 - 5.1 mmol/L 4.7 4.3 3.0(L)  Chloride 98 - 111 mmol/L 93(L) 95(L) 92(L)  CO2 22 - 32 mmol/L 22 19(L) 20(L)  Calcium 8.9 - 10.3 mg/dL 7.6(L) 7.1(L) 7.2(L)  Total Protein 6.5 - 8.1 g/dL 5.0(L) 4.8(L) 5.2(L)  Total Bilirubin 0.3 - 1.2 mg/dL 2.1(H) 1.7(H) 1.7(H)  Alkaline Phos 38 - 126 U/L 60 56 58  AST 15 - 41 U/L 43(H) 58(H) 52(H)  ALT 0 - 44 U/L 23 24 20     Imaging studies: No new pertinent imaging studies   Assessment/Plan:  Patient re evaluated and found more alert than during previous evaluation. New intra vesical pressure is 16. Foley in place with adequate urine output. BP right now is 164/97. No  respiratory distress. No renal failure. No sign of other organ complication. I recommend to continue current management watching to improve his fluid positive balance. There is no need for emergent surgical management at this moment but will continue to follow closely.   This follow up encounter was more than 30 minutes.   9.83(J, MD

## 2020-05-06 NOTE — Consult Note (Signed)
SURGICAL CONSULTATION NOTE   HISTORY OF PRESENT ILLNESS (HPI):  34 y.o. male presented to [facility] ED for evaluation of altered mental status 3 days ago. Patient patient not able to give history due to altered mental status.  Most of the history taken from the chart and from significant other that is at bedside.  As per significant other the patient is a heavy alcohol drinker.  He was admitted due to alcohol withdrawal.  Upon complete evaluation he had CT scan of the abdominal pelvis that shows ascites and peripancreatic fluid collections suspected for pseudocyst.  Lipase elevated upon admission.  I personally evaluated the images.  There was no free air.  I was consulted today from Dr. Jerral Ralph due to distention of the abdomen.  He was concerned about abdominal compartment syndrome.  Upon evaluation patient with abdominal distention.  He was with altered mental status but arousable.  He was no in any respiratory distress.  Unable to assess pain radiation, alleviating or aggravating factors.  Surgery is consulted by Dr. Rogers Blocker in this context for evaluation and management of abdominal distention, rule out abdominal compartment syndrome.  PAST MEDICAL HISTORY (PMH):  Past Medical History:  Diagnosis Date  . Alcohol use disorder, severe, dependence (HCC)      PAST SURGICAL HISTORY (PSH):  History reviewed. No pertinent surgical history.   MEDICATIONS:  Prior to Admission medications   Not on File     ALLERGIES:  Allergies  Allergen Reactions  . Sudafed [Pseudoephedrine]     History per pt of this being linked with his seizures     SOCIAL HISTORY:  Social History   Socioeconomic History  . Marital status: Divorced    Spouse name: Not on file  . Number of children: Not on file  . Years of education: Not on file  . Highest education level: Not on file  Occupational History  . Not on file  Tobacco Use  . Smoking status: Current Every Day Smoker  . Smokeless tobacco: Never Used   Substance and Sexual Activity  . Alcohol use: Yes    Comment: 24 beers/day since age 55.  . Drug use: Not Currently  . Sexual activity: Not on file  Other Topics Concern  . Not on file  Social History Narrative  . Not on file   Social Determinants of Health   Financial Resource Strain:   . Difficulty of Paying Living Expenses:   Food Insecurity:   . Worried About Programme researcher, broadcasting/film/video in the Last Year:   . Barista in the Last Year:   Transportation Needs:   . Freight forwarder (Medical):   Marland Kitchen Lack of Transportation (Non-Medical):   Physical Activity:   . Days of Exercise per Week:   . Minutes of Exercise per Session:   Stress:   . Feeling of Stress :   Social Connections:   . Frequency of Communication with Friends and Family:   . Frequency of Social Gatherings with Friends and Family:   . Attends Religious Services:   . Active Member of Clubs or Organizations:   . Attends Banker Meetings:   Marland Kitchen Marital Status:   Intimate Partner Violence:   . Fear of Current or Ex-Partner:   . Emotionally Abused:   Marland Kitchen Physically Abused:   . Sexually Abused:       FAMILY HISTORY:  Family History  Problem Relation Age of Onset  . Alcohol abuse Mother      REVIEW  OF SYSTEMS: Taken from significant other Constitutional: denies weight loss, fever, chills, or sweats.  Altered mental status Eyes: denies any other vision changes, history of eye injury  ENT: denies sore throat, hearing problems  Respiratory: denies shortness of breath, wheezing  Cardiovascular: denies chest pain, palpitations  Gastrointestinal: nausea and vomiting.  Positive for abdominal distention Genitourinary: denies burning with urination or urinary frequency Musculoskeletal: denies any other joint pains or cramps  Skin: denies any other rashes or skin discolorations  Neurological: denies any other headache, positive dizziness, weakness  Psychiatric: Positive depression, anxiety   All  other review of systems were negative   VITAL SIGNS:  Temp:  [98.2 F (36.8 C)-99.1 F (37.3 C)] 98.2 F (36.8 C) (06/27 1125) Pulse Rate:  [112-135] 114 (06/27 1300) Resp:  [14-27] 20 (06/27 1300) BP: (124-165)/(95-117) 165/115 (06/27 1300) SpO2:  [93 %-100 %] 96 % (06/27 1300)     Height: 6\' 1"  (185.4 cm) Weight: 63.5 kg BMI (Calculated): 18.47   PHYSICAL EXAM:  Constitutional:  -- Normal body habitus  --Somnolent, no respiratory distress Eyes:  -- Pupils equally round and reactive to light  -- No scleral icterus  Ear, nose, and throat:  -- No jugular venous distension  Pulmonary:  -- No crackles  -- Equal breath sounds bilaterally -- Breathing non-labored at rest Cardiovascular:  --Tachycardic -- S1, S2 present  -- No pericardial rubs Gastrointestinal:  -- Abdomen tense, nontender, distended, no guarding or rebound tenderness -- No abdominal masses appreciated, pulsatile or otherwise  Musculoskeletal and Integumentary:  -- Wounds: None appreciated -- Extremities: B/L UE and LE FROM, hands and feet warm, no edema  Neurologic:  -- Motor function: intact and symmetric -- Sensation: intact and symmetric   Labs:  CBC Latest Ref Rng & Units 05/05/2020 05/04/2020 05/03/2020  WBC 4.0 - 10.5 K/uL 9.7 10.1 11.9(H)  Hemoglobin 13.0 - 17.0 g/dL 11.9(L) 13.0 15.3  Hematocrit 39 - 52 % 33.6(L) 35.9(L) 42.5  Platelets 150 - 400 K/uL 108(L) 86(L) 110(L)   CMP Latest Ref Rng & Units 05/06/2020 05/05/2020 05/04/2020  Glucose 70 - 99 mg/dL 125(H) 142(H) 118(H)  BUN 6 - 20 mg/dL 15 19 37(H)  Creatinine 0.61 - 1.24 mg/dL 0.93 0.96 1.55(H)  Sodium 135 - 145 mmol/L 125(L) 123(L) 126(L)  Potassium 3.5 - 5.1 mmol/L 4.7 4.3 3.0(L)  Chloride 98 - 111 mmol/L 93(L) 95(L) 92(L)  CO2 22 - 32 mmol/L 22 19(L) 20(L)  Calcium 8.9 - 10.3 mg/dL 7.6(L) 7.1(L) 7.2(L)  Total Protein 6.5 - 8.1 g/dL 5.0(L) 4.8(L) 5.2(L)  Total Bilirubin 0.3 - 1.2 mg/dL 2.1(H) 1.7(H) 1.7(H)  Alkaline Phos 38 - 126 U/L 60  56 58  AST 15 - 41 U/L 43(H) 58(H) 52(H)  ALT 0 - 44 U/L 23 24 20     Imaging studies:  CLINICAL DATA:  Abdominal distension; history of severe alcohol use disorder/dependence  EXAM: CT ABDOMEN AND PELVIS WITH CONTRAST  TECHNIQUE: Multidetector CT imaging of the abdomen and pelvis was performed using the standard protocol following bolus administration of intravenous contrast. Sagittal and coronal MPR images reconstructed from axial data set.  CONTRAST:  15mL OMNIPAQUE IOHEXOL 300 MG/ML SOLN IV. Dilute oral contrast.  COMPARISON:  None  FINDINGS: Lower chest: Small BILATERAL pleural effusions and minimal bibasilar atelectasis  Hepatobiliary: Gallbladder unremarkable. Mild fatty infiltration of liver.  Pancreas: Poorly defined low-attenuation focus at pancreatic body 14 x 12 x 12 mm image 31. Large cystic lesion at pancreatic head/uncinate process 4.0 x 4.4 x  3.8 cm, containing a single septation. Additional low-attenuation focus at pancreatic body 10 x 9 x 11 mm.  Spleen: Minimal peripancreatic infiltration. Findings could represent pancreatitis involving the head and body of the pancreas, without significant edema adjacent to pancreatic tail.  Adrenals/Urinary Tract: Normal appearance  Stomach/Bowel: Adrenal glands, kidneys, ureters, and bladder normal appearance  Vascular/Lymphatic: Probable normal appendix. Small hiatal hernia. Retained contrast in fluid in esophagus. Stomach decompressed. Prominent small bowel loops with mild wall edema identified in the mid abdomen and pelvis, nonspecific. No evidence of obstruction or perforation. Probable mild rectal wall thickening and wall hypervascularity.  Reproductive: Aorta normal caliber. Vascular structures patent. Scattered normal size mesenteric and retroperitoneal lymph nodes without definite adenopathy.  Other: Umbilical hernia containing fat. Moderate ascites. No free air. BILATERAL inguinal  hernias containing fat.  Musculoskeletal: Unremarkable  IMPRESSION: Edema adjacent to head and body of pancreas question pancreatitis; recommend correlation with serum lipase/amylase levels.  Cystic lesions at the pancreas, largest at head 4.4 cm diameter, could represent pseudocysts in a patient with pancreatitis or cystic pancreatic neoplasms; recommend attention on follow-up imaging to ensure resolution, or may characterize by MR.  Moderate ascites with mild fatty infiltration of liver.  Small BILATERAL pleural effusions and minimal bibasilar atelectasis.  Small hiatal hernia.  Umbilical and BILATERAL inguinal hernias.  Prominent small bowel loops with mild wall edema in the mid abdomen and pelvis, nonspecific, could be related to enteritis or ascites.  Question mild rectal wall thickening and hypervascularity cannot exclude proctitis.  Aortic Atherosclerosis (ICD10-I70.0).   Electronically Signed   By: Ulyses Southward M.D.   On: 05/04/2020 15:50  Assessment/Plan:  34 y.o. male with acute over chronic pancreatitis with pseudocyst, complicated by pertinent comorbidities including alcohol withdrawal.  Patient admitted due to alcohol withdrawal with altered mental status.  Upon complete evaluation patient was found with acute over chronic pancreatitis with pseudocyst formation.  Aggressive IV hydration has been done for the treatment of alcohol withdrawal and pancreatitis.  Patient has developed increased abdominal distention.  CT scan of 05/04/20 shows ascites.  Indirect intra-abdominal pressure measuring bladder pressure was 20 mm Hg. Patient currently without respiratory distress, adequate renal function, adequate blood pressure measurements (no sign of vena cava compression) and no other sign of organ failure.  At this moment patient with grade 2 intra-abdominal hypertension without abdominal compartment syndrome.  Patient very high risk of developing abdominal  compartment syndrome.  As nonsurgical management options I recommend to put NGT for intestinal decompression, keep Foley catheter in place for close urine output measurement, adequate pain management, and sedation if needed and aggressive correction of positive fluid balance.  Also I would recommend to consider drainage of ascites with paracentesis.  As this patient pancreatitis continue to deteriorate clinically and patient currently hemodynamically stable I also recommend considering transfer to a tertiary institution.  Some of this patient will need invasive procedures for pancreatitis that are not available in this institution.  All of the above findings and recommendations were discussed with patient's significant other, and all her questions were answered to her expressed satisfaction.  Gae Gallop, MD

## 2020-05-06 NOTE — Progress Notes (Addendum)
PROGRESS NOTE    Shane Denardo Sr.  GNF:[id number] DOB: 27-Oct-1986 DOA: 05/03/2020 PCP: [name], MD    Brief Narrative:  34 year old gentleman with no medical history, chronic alcoholism, has been drinking nonstop about 12 packs of beer daily since age of 79 presented to the emergency room for evaluation of progressive confusion, lethargy.  He also had episode of unresponsiveness in the triage area.  As per patient's fianc, patient stopped drinking about 24 hours prior to arrival and started behaving altered, confused.  At the same time, patient has multiple episodes of loose watery stool, some of them dark appearing.  No nausea or vomiting.  In the emergency room, he was sick looking.  Blood pressure stable.  Tachycardic with heart rate 141.  Respiratory 21.  Afebrile.  100% on room air. Sodium 124, potassium was 2.8, creatinine 1.75, bilirubin 2.  Anion gap 19.  CK 700.  Chest x-ray was essentially normal.  CT head was normal.  Patient suspected to have acute alcohol withdrawal and admitted to the hospital.   Assessment & Plan:   Principal Problem:   Alcohol withdrawal seizure with delirium (HCC) Active Problems:   Hyponatremia   Hypokalemia   AKI (acute kidney injury) (HCC)   Elevated CK   Heme positive stool   Thrombocytopenia (HCC)  Alcohol withdrawal with delirium: Alcoholism and alcohol use disorder. Currently on tapering dose of Librium that we will continue.  Also on as needed benzodiazepine.  On high-dose multivitamin. Will check vitamin B12 levels and folic acid levels.    Abdominal pain/diarrhea/hypotension and tachycardia:  With alcoholic pancreatitis.  Also suspect alcohol induced gastritis.   Lipase is persistently elevated.  Patient with diarrhea and heme positive stool.  Hemoglobin is stable. CT scan consistent with multiple pancreatic cyst, pancreatic inflammation.  Patient probably had pancreatitis in the past gone unnoticed. N.p.o. with ice chips,  adequate pain medications. KUB today shows no bowel obstruction or intestinal perforation. Patient continues to have very rigid abdomen, insert Foley catheter and will check intra-abdominal pressure. If prolonged n.p.o. for next 24 to 48 hours, will insert postpyloric feeding tube and start feeding him.  Hypovolemic hyponatremia: Continue isotonic fluid resuscitation. Low but stable.  Acute renal failure: Improved.  No obstruction.  Adequate urine output.   Hypokalemia: Replaced with improvement.  Magnesium and phosphorus improved.  Addendum: 230 PM I personally discussed case with surgery, patient was seen in consultation. Intra-abdominal pressure with Foley catheter is measured at 20, patient has adequate blood pressure.  No evidence of endorgan damage.  His renal functions, WBC count and lactic acid improved than before. We will continue to monitor for clinical improvement. Surgery suggested NG tube decompression, will put NG tube on intermittent wall suction.   DVT prophylaxis: SCDs Start: 05/03/20 2153   Code Status: Full code Family Communication: Patient's fianc at bedside. Disposition Plan: Status is: Inpatient  Remains inpatient appropriate because:Persistent severe electrolyte disturbances, IV treatments appropriate due to intensity of illness or inability to take PO and Inpatient level of care appropriate due to severity of illness   Dispo: The patient is from: Home              Anticipated d/c is to: Home              Anticipated d/c date is: > 3 days              Patient currently is not medically stable to d/c.  Consultants:   None  Procedures:   None  Antimicrobials:   None   Subjective:  Patient seen and examined.  Continues to have abdominal pain and rigidity.  Denies any nausea or vomiting.  He likes to drink water. "I am trying to behave and feel better, maybe slightly better than how I was 2 days ago" Fianc at the bedside. Had 1  large bowel movement in the morning.   Objective: Vitals:   05/06/20 1100 05/06/20 1125 05/06/20 1200 05/06/20 1300  BP: (!) 156/117  (!) 147/107 (!) 165/115  Pulse: (!) 121 (!) 118 (!) 117 (!) 114  Resp: (!) 27 19 18 20   Temp:  98.2 F (36.8 C)    TempSrc:  Axillary    SpO2: 100% 100% 100% 96%  Weight:      Height:        Intake/Output Summary (Last 24 hours) at 05/06/2020 1432 Last data filed at 05/06/2020 1300 Gross per 24 hour  Intake 2247.92 ml  Output 725 ml  Net 1522.92 ml   Filed Weights   05/03/20 2232  Weight: 63.5 kg    Examination: Physical Exam Constitutional:      Comments: Sick looking, anxious and lethargic.  HENT:     Head: Normocephalic.  Eyes:     Pupils: Pupils are equal, round, and reactive to light.  Cardiovascular:     Rate and Rhythm: Regular rhythm. Tachycardia present.  Pulmonary:     Effort: Pulmonary effort is normal.  Abdominal:     General: Abdomen is flat.     Comments: Generalized tenderness with voluntary guarding.  Bowel sounds present.  Skin:    General: Skin is warm.  Neurological:     General: No focal deficit present.     Comments: Lethargic      Data Reviewed: I have personally reviewed following labs and imaging studies  CBC: Recent Labs  Lab 05/03/20 1942 05/04/20 0533 05/05/20 0507  WBC 11.9* 10.1 9.7  NEUTROABS 9.5*  --  7.3  HGB 15.3 13.0 11.9*  HCT 42.5 35.9* 33.6*  MCV 95.3 94.2 94.1  PLT 110* 86* 175*   Basic Metabolic Panel: Recent Labs  Lab 05/03/20 1942 05/03/20 2131 05/04/20 0533 05/05/20 0507 05/06/20 0318  NA 124*  --  126* 123* 125*  K 2.8*  --  3.0* 4.3 4.7  CL 90*  --  92* 95* 93*  CO2 15*  --  20* 19* 22  GLUCOSE 162*  --  118* 142* 125*  BUN 33*  --  37* 19 15  CREATININE 1.75*  --  1.55* 0.96 0.93  CALCIUM 6.8*  --  7.2* 7.1* 7.6*  MG  --  2.4 2.6* 2.1  --   PHOS  --   --  4.0 2.5  --    GFR: Estimated Creatinine Clearance: 100.5 mL/min (by C-G formula based on SCr of 0.93  mg/dL). Liver Function Tests: Recent Labs  Lab 05/03/20 1942 05/04/20 0533 05/05/20 0507 05/06/20 0318  AST 36 52* 58* 43*  ALT 18 20 24 23   ALKPHOS 64 58 56 60  BILITOT 2.1* 1.7* 1.7* 2.1*  PROT 5.1* 5.2* 4.8* 5.0*  ALBUMIN 2.4* 2.4* 2.2* 2.0*   Recent Labs  Lab 05/04/20 0533 05/05/20 0507 05/06/20 0318  LIPASE 481* 370* 463*   No results for input(s): AMMONIA in the last 168 hours. Coagulation Profile: Recent Labs  Lab 05/03/20 1942  INR 1.1   Cardiac Enzymes: Recent Labs  Lab 05/03/20 1942  05/04/20 0533  CKTOTAL 738* 1,440*   BNP (last 3 results) No results for input(s): PROBNP in the last 8760 hours. HbA1C: No results for input(s): HGBA1C in the last 72 hours. CBG: Recent Labs  Lab 05/05/20 1619 05/05/20 2113 05/05/20 2349 05/06/20 0725 05/06/20 1122  GLUCAP 155* 124* 114* 118* 103*   Lipid Profile: No results for input(s): CHOL, HDL, LDLCALC, TRIG, CHOLHDL, LDLDIRECT in the last 72 hours. Thyroid Function Tests: No results for input(s): TSH, T4TOTAL, FREET4, T3FREE, THYROIDAB in the last 72 hours. Anemia Panel: No results for input(s): VITAMINB12, FOLATE, FERRITIN, TIBC, IRON, RETICCTPCT in the last 72 hours. Sepsis Labs: Recent Labs  Lab 05/03/20 2131 05/04/20 0534  LATICACIDVEN 2.8* 2.1*    Recent Results (from the past 240 hour(s))  SARS Coronavirus 2 by RT PCR (hospital order, performed in [facility] hospital lab) Nasopharyngeal Nasopharyngeal Swab     Status: None   Collection Time: 05/03/20  8:25 PM   Specimen: Nasopharyngeal Swab  Result Value Ref Range Status   SARS Coronavirus 2 NEGATIVE NEGATIVE Final    Comment: (NOTE) SARS-CoV-2 target nucleic acids are NOT DETECTED.  The SARS-CoV-2 RNA is generally detectable in upper and lower respiratory specimens during the acute phase of infection. The lowest concentration of SARS-CoV-2 viral copies this assay can detect is 250 copies / mL. A negative result does not preclude  SARS-CoV-2 infection and should not be used as the sole basis for treatment or other patient management decisions.  A negative result may occur with improper specimen collection / handling, submission of specimen other than nasopharyngeal swab, presence of viral mutation(s) within the areas targeted by this assay, and inadequate number of viral copies (<250 copies / mL). A negative result must be combined with clinical observations, patient history, and epidemiological information.  Fact Sheet for Patients:   BoilerBrush.com.cy  Fact Sheet for Healthcare Providers: https://pope.com/  This test is not yet approved or  cleared by the [place] FDA and has been authorized for detection and/or diagnosis of SARS-CoV-2 by FDA under an Emergency Use Authorization (EUA).  This EUA will remain in effect (meaning this test can be used) for the duration of the COVID-19 declaration under Section 564(b)(1) of the Act, 21 U.S.C. section 360bbb-3(b)(1), unless the authorization is terminated or revoked sooner.  Performed at [facility], [address] [place], [place] [address]   MRSA PCR Screening     Status: None   Collection Time: 05/03/20 11:57 PM   Specimen: Nasopharyngeal  Result Value Ref Range Status   MRSA by PCR NEGATIVE NEGATIVE Final    Comment:        The GeneXpert MRSA Assay (FDA approved for NASAL specimens only), is one component of a comprehensive MRSA colonization surveillance program. It is not intended to diagnose MRSA infection nor to guide or monitor treatment for MRSA infections. Performed at [facility], [address] [place], [place] [address]          Radiology Studies: DG Abd 1 View  Result Date: 05/06/2020 CLINICAL DATA:  Abdominal distension. EXAM: ABDOMEN - 1 VIEW COMPARISON:  CT scan May 04, 2020 FINDINGS: There is contrast in the colon extending to the level the rectum. No  small bowel dilatation. No free air, portal venous gas, or pneumatosis. No other acute abnormalities are identified. IMPRESSION: No evidence of bowel obstruction. No cause for bowel a distension identified on this study. Electronically Signed   By: Gerome Sam III M.D  On: 05/06/2020 11:10   CT ABDOMEN PELVIS W CONTRAST  Result Date: 05/04/2020 CLINICAL DATA:  Abdominal distension; history of severe alcohol use disorder/dependence EXAM: CT ABDOMEN AND PELVIS WITH CONTRAST TECHNIQUE: Multidetector CT imaging of the abdomen and pelvis was performed using the standard protocol following bolus administration of intravenous contrast. Sagittal and coronal MPR images reconstructed from axial data set. CONTRAST:  OMNIPAQUE IOHEXOL 300 MG/ML SOLN IV. Dilute oral contrast. COMPARISON:  None FINDINGS: Lower chest: Small BILATERAL pleural effusions and minimal bibasilar atelectasis Hepatobiliary: Gallbladder unremarkable. Mild fatty infiltration of liver. Pancreas: Poorly defined low-attenuation focus at pancreatic body 14 x 12 x 12 mm image 31. Large cystic lesion at pancreatic head/uncinate process 4.0 x 4.4 x 3.8 cm, containing a single septation. Additional low-attenuation focus at pancreatic body 10 x 9 x 11 mm. Spleen: Minimal peripancreatic infiltration. Findings could represent pancreatitis involving the head and body of the pancreas, without significant edema adjacent to pancreatic tail. Adrenals/Urinary Tract: Normal appearance Stomach/Bowel: Adrenal glands, kidneys, ureters, and bladder normal appearance Vascular/Lymphatic: Probable normal appendix. Small hiatal hernia. Retained contrast in fluid in esophagus. Stomach decompressed. Prominent small bowel loops with mild wall edema identified in the mid abdomen and pelvis, nonspecific. No evidence of obstruction or perforation. Probable mild rectal wall thickening and wall hypervascularity. Reproductive: Aorta normal caliber. Vascular structures patent.  Scattered normal size mesenteric and retroperitoneal lymph nodes without definite adenopathy. Other: Umbilical hernia containing fat. Moderate ascites. No free air. BILATERAL inguinal hernias containing fat. Musculoskeletal: Unremarkable IMPRESSION: Edema adjacent to head and body of pancreas question pancreatitis; recommend correlation with serum lipase/amylase levels. Cystic lesions at the pancreas, largest at head 4.4 cm diameter, could represent pseudocysts in a patient with pancreatitis or cystic pancreatic neoplasms; recommend attention on follow-up imaging to ensure resolution, or may characterize by MR. Moderate ascites with mild fatty infiltration of liver. Small BILATERAL pleural effusions and minimal bibasilar atelectasis. Small hiatal hernia. Umbilical and BILATERAL inguinal hernias. Prominent small bowel loops with mild wall edema in the mid abdomen and pelvis, nonspecific, could be related to enteritis or ascites. Question mild rectal wall thickening and hypervascularity cannot exclude proctitis. Aortic Atherosclerosis (ICD10-I70.0). Electronically Signed   By: Ulyses Southward M.D.   On: 05/04/2020 15:50        Scheduled Meds: . chlordiazePOXIDE  25 mg Oral BH-qamhs   Followed by  . [START ON 05/07/2020] chlordiazePOXIDE  25 mg Oral Daily  . Chlorhexidine Gluconate Cloth  6 each Topical Daily  . folic acid  1 mg Oral Daily  . multivitamin with minerals  1 tablet Oral Daily  . pantoprazole (PROTONIX) IV  40 mg Intravenous Q12H  . pneumococcal 23 valent vaccine  0.5 mL Intramuscular Tomorrow-1000  . sodium chloride flush  3 mL Intravenous Q12H  . thiamine  100 mg Oral Daily   Or  . thiamine  100 mg Intravenous Daily   Continuous Infusions: . sodium chloride 100 mL/hr at 05/06/20 1300     LOS: 3 days    Time spent: 30 minutes    Dorcas Carrow, MD [facility] Pager [phone]

## 2020-05-07 ENCOUNTER — Inpatient Hospital Stay: Payer: Medicaid Other

## 2020-05-07 LAB — CBC WITH DIFFERENTIAL/PLATELET
Abs Immature Granulocytes: 0.15 10*3/uL — ABNORMAL HIGH (ref 0.00–0.07)
Basophils Absolute: 0 10*3/uL (ref 0.0–0.1)
Basophils Relative: 0 %
Eosinophils Absolute: 0.2 10*3/uL (ref 0.0–0.5)
Eosinophils Relative: 1 %
HCT: 30.5 % — ABNORMAL LOW (ref 39.0–52.0)
Hemoglobin: 10.9 g/dL — ABNORMAL LOW (ref 13.0–17.0)
Immature Granulocytes: 1 %
Lymphocytes Relative: 3 %
Lymphs Abs: 0.4 10*3/uL — ABNORMAL LOW (ref 0.7–4.0)
MCH: 33.4 pg (ref 26.0–34.0)
MCHC: 35.7 g/dL (ref 30.0–36.0)
MCV: 93.6 fL (ref 80.0–100.0)
Monocytes Absolute: 3.4 10*3/uL — ABNORMAL HIGH (ref 0.1–1.0)
Monocytes Relative: 24 %
Neutro Abs: 10.2 10*3/uL — ABNORMAL HIGH (ref 1.7–7.7)
Neutrophils Relative %: 71 %
Platelets: 173 10*3/uL (ref 150–400)
RBC: 3.26 MIL/uL — ABNORMAL LOW (ref 4.22–5.81)
RDW: 11.7 % (ref 11.5–15.5)
WBC: 14.3 10*3/uL — ABNORMAL HIGH (ref 4.0–10.5)
nRBC: 0 % (ref 0.0–0.2)

## 2020-05-07 LAB — COMPREHENSIVE METABOLIC PANEL
ALT: 22 U/L (ref 0–44)
AST: 41 U/L (ref 15–41)
Albumin: 1.8 g/dL — ABNORMAL LOW (ref 3.5–5.0)
Alkaline Phosphatase: 61 U/L (ref 38–126)
Anion gap: 12 (ref 5–15)
BUN: 13 mg/dL (ref 6–20)
CO2: 18 mmol/L — ABNORMAL LOW (ref 22–32)
Calcium: 7.6 mg/dL — ABNORMAL LOW (ref 8.9–10.3)
Chloride: 94 mmol/L — ABNORMAL LOW (ref 98–111)
Creatinine, Ser: 0.7 mg/dL (ref 0.61–1.24)
GFR calc Af Amer: >60 mL/min (ref >60)
GFR calc non Af Amer: >60 mL/min (ref >60)
Glucose, Bld: 107 mg/dL — ABNORMAL HIGH (ref 70–99)
Potassium: 4.5 mmol/L (ref 3.5–5.1)
Sodium: 124 mmol/L — ABNORMAL LOW (ref 135–145)
Total Bilirubin: 2.1 mg/dL — ABNORMAL HIGH (ref 0.3–1.2)
Total Protein: 4.7 g/dL — ABNORMAL LOW (ref 6.5–8.1)

## 2020-05-07 LAB — BODY FLUID CELL COUNT WITH DIFFERENTIAL
Eos, Fluid: 0 %
Lymphs, Fluid: 0 %
Monocyte-Macrophage-Serous Fluid: 21 %
Neutrophil Count, Fluid: 79 %
Total Nucleated Cell Count, Fluid: 4822 cu mm

## 2020-05-07 LAB — LEGIONELLA PNEUMOPHILA SEROGP 1 UR AG: L. pneumophila Serogp 1 Ur Ag: NEGATIVE

## 2020-05-07 LAB — LIPASE, BLOOD: Lipase: 365 U/L — ABNORMAL HIGH (ref 11–51)

## 2020-05-07 LAB — GLUCOSE, CAPILLARY
Glucose-Capillary: 104 mg/dL — ABNORMAL HIGH (ref 70–99)
Glucose-Capillary: 84 mg/dL (ref 70–99)
Glucose-Capillary: 91 mg/dL (ref 70–99)
Glucose-Capillary: 92 mg/dL (ref 70–99)
Glucose-Capillary: 96 mg/dL (ref 70–99)

## 2020-05-07 LAB — ALBUMIN, PLEURAL OR PERITONEAL FLUID: Albumin, Fluid: 1.3 g/dL

## 2020-05-07 LAB — PHOSPHORUS: Phosphorus: 3.5 mg/dL (ref 2.5–4.6)

## 2020-05-07 LAB — MAGNESIUM: Magnesium: 1.8 mg/dL (ref 1.7–2.4)

## 2020-05-07 LAB — AMYLASE, PLEURAL OR PERITONEAL FLUID: Amylase, Fluid: >10000 U/L

## 2020-05-07 LAB — LACTATE DEHYDROGENASE, PLEURAL OR PERITONEAL FLUID: LD, Fluid: 526 U/L — ABNORMAL HIGH (ref 3–23)

## 2020-05-07 IMAGING — DX DG ABDOMEN 1V
1 series · 1 of 1 positions shown · non-contrast
Comparison: Radiograph yesterday

CLINICAL DATA: NG tube placement.

EXAM:
ABDOMEN - 1 VIEW

[abdomen supine]
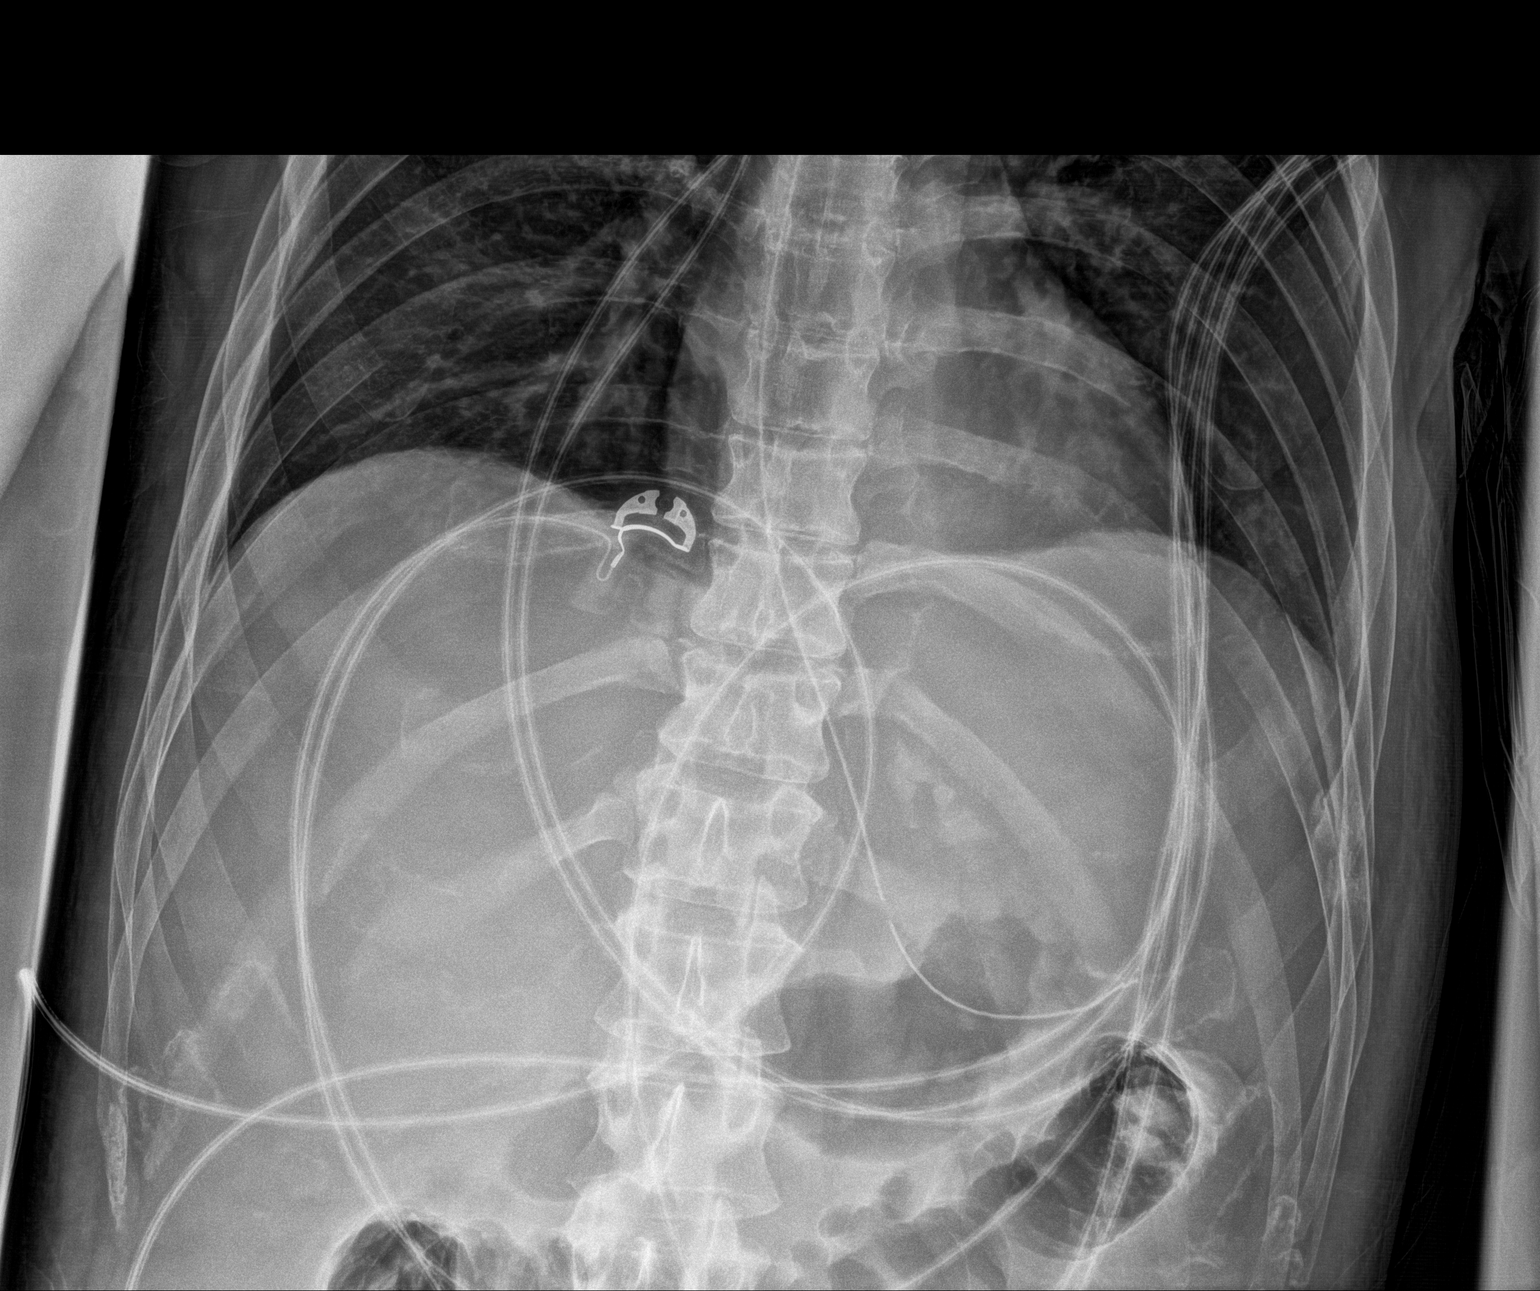

[1 of 1 positions shown; findings below may reference images not displayed]

FINDINGS: Tip and side port of the enteric tube below the diaphragm in the
stomach. Air within in the stomach and transverse colon. No evidence
of free air.
IMPRESSION: Tip and side port of the enteric tube below the diaphragm in the
stomach.

## 2020-05-07 IMAGING — US US PARACENTESIS
1 series · 4 of 4 positions shown · non-contrast
Comparison: none

INDICATION: Patient with history of abdominal pain, pancreatitis, abdominal
distention, ascites. Request made for diagnostic and therapeutic
paracentesis.

[Series 1: us paracentesis · 4 of 4 slices shown]
[im 1/4]
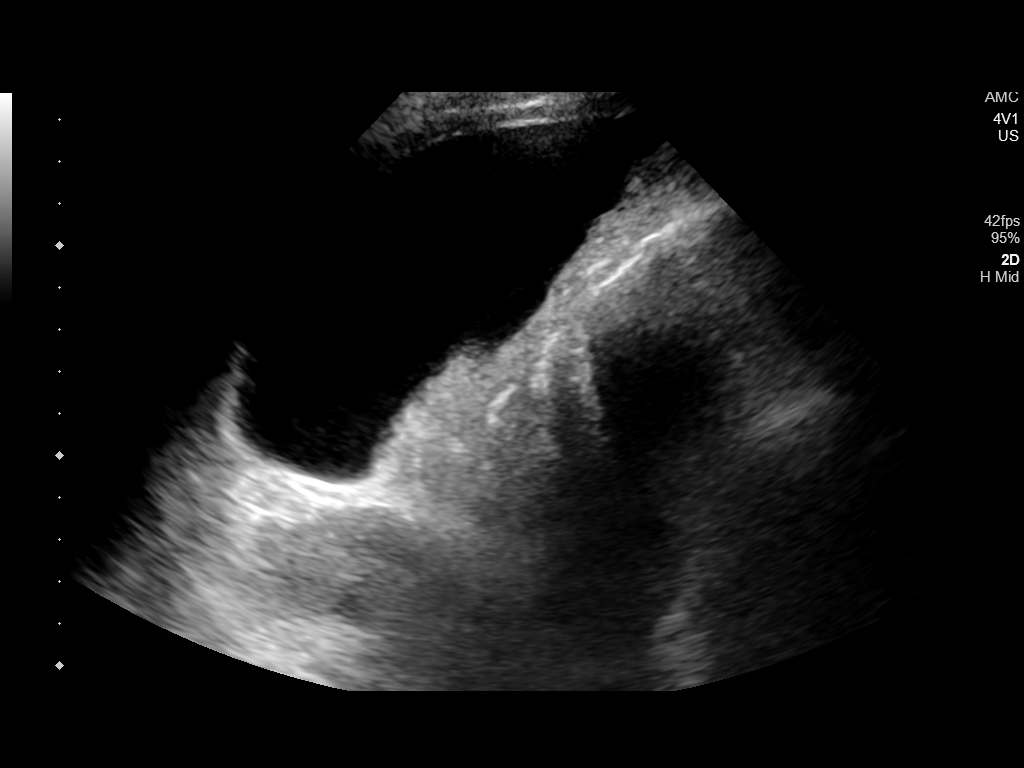
[im 2/4]
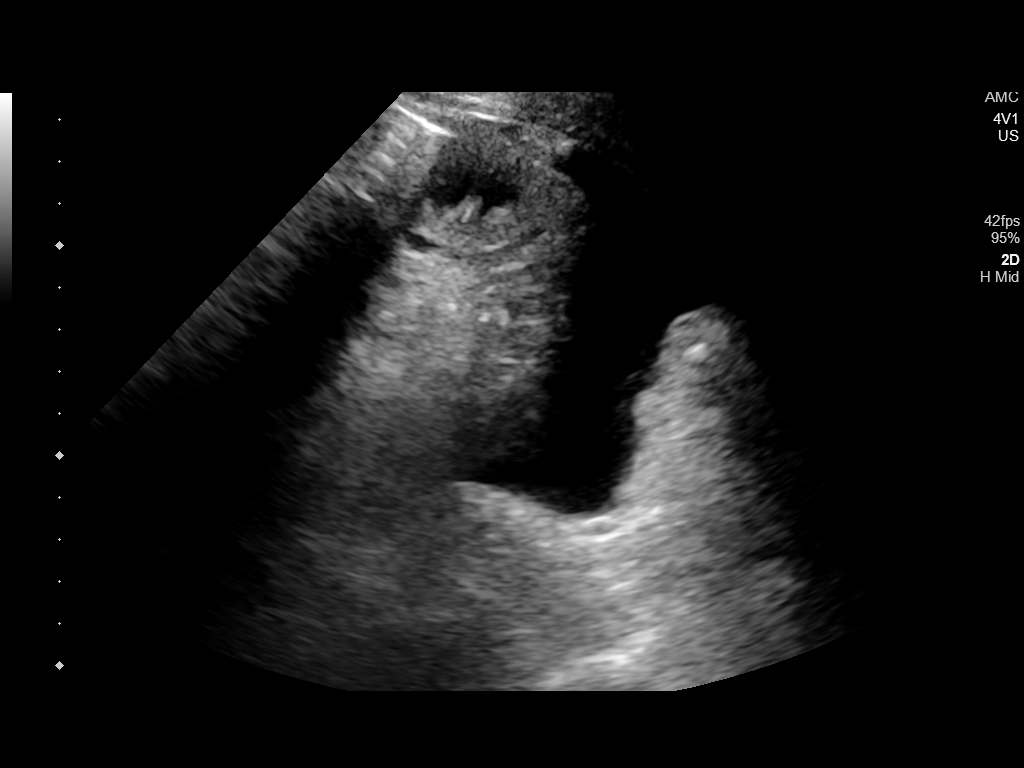
[im 3/4]
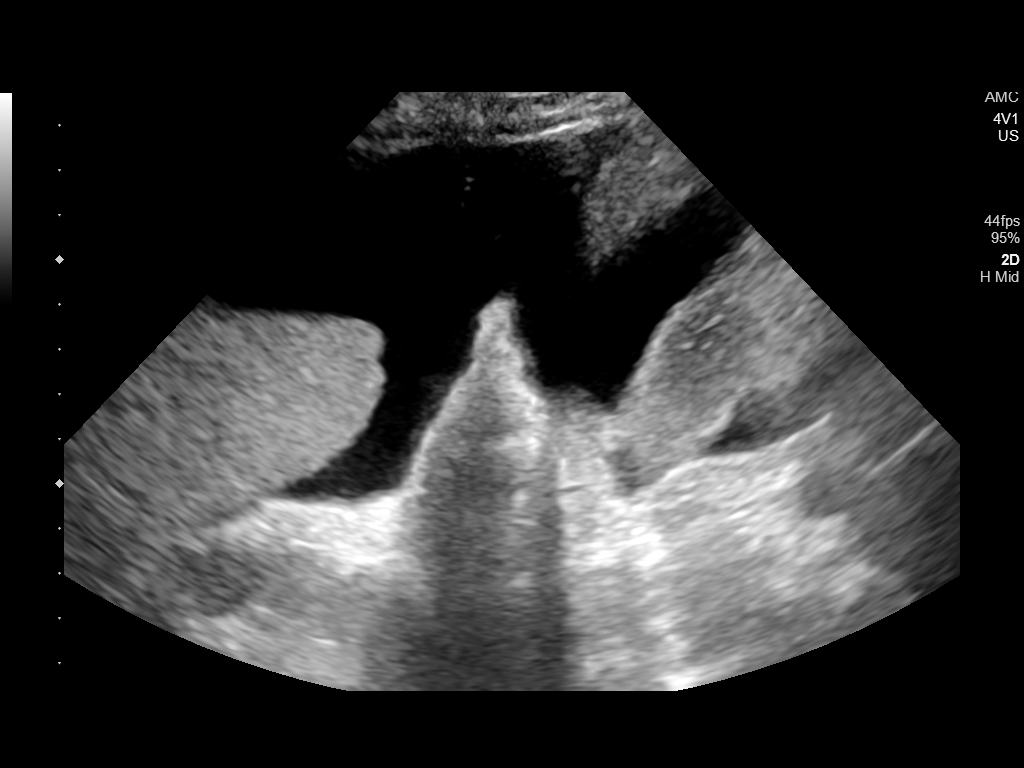
[im 4/4]
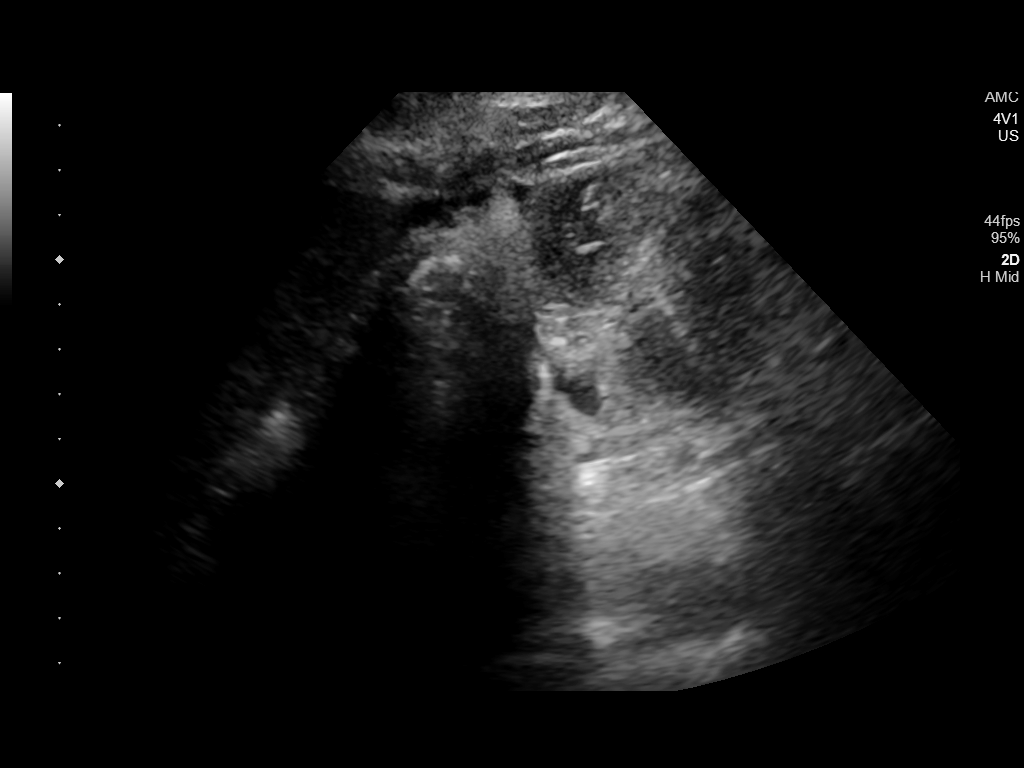

[4 of 4 positions shown; findings below may reference images not displayed]

EXAM:
ULTRASOUND GUIDED DIAGNOSTIC AND THERAPEUTIC PARACENTESIS

MEDICATIONS:
10 mL 1% lidocaine

COMPLICATIONS:
None immediate.

PROCEDURE:
Informed written consent was obtained from the patient after a
discussion of the risks, benefits and alternatives to treatment. A
timeout was performed prior to the initiation of the procedure.

Initial ultrasound scanning demonstrates a small amount of ascites
within the right lower abdominal quadrant. The right lower abdomen
was prepped and draped in the usual sterile fashion. 1% lidocaine
was used for local anesthesia.

Following this, a 19 gauge, 7-cm, Yueh catheter was introduced. An
ultrasound image was saved for documentation purposes. The
paracentesis was performed. The catheter was removed and a dressing
was applied. The patient tolerated the procedure well without
immediate post procedural complication.
FINDINGS: A total of approximately 2.8 liters of yellow fluid was removed.
Samples were sent to the laboratory as requested by the clinical
team.
IMPRESSION: Successful ultrasound-guided paracentesis yielding 2.8 liters of
peritoneal fluid.

## 2020-05-07 MED ORDER — MAGNESIUM SULFATE 2 GM/50ML IV SOLN
2.0000 g | Freq: Once | INTRAVENOUS | Status: AC
  Administered 2020-05-07: 2 g via INTRAVENOUS
  Filled 2020-05-07: qty 50

## 2020-05-07 MED ORDER — SODIUM CHLORIDE 0.9 % IV SOLN
1.0000 g | Freq: Three times a day (TID) | INTRAVENOUS | Status: DC
  Administered 2020-05-07 – 2020-05-10 (×9): 1 g via INTRAVENOUS
  Filled 2020-05-07 (×13): qty 1

## 2020-05-07 NOTE — Progress Notes (Signed)
Abd xray verified NG tube in correct location.

## 2020-05-07 NOTE — Progress Notes (Signed)
Alert and oriented patient removed NG tube, a second time this shift. Provided patient with education concerning the importance of NG tube. Patient verbalized understanding. 16 FR NG tube placed in right nare without difficulty. Patient tolerated well. Abd xray ordered for verification of placement. Safety mitts placed on patient to help prevent removal again. Patient education provided as to purpose of the safety mitts. Patient verbalized understanding.

## 2020-05-07 NOTE — Progress Notes (Signed)
Patient removed NG tube. Discussed with patient the importance for NG tube, patient verbalized understanding. 14 FR NG tube placed in right nare without difficulty. Patient tolerated well. Abd xray ordered for verification of placement. Patient instructed to not pull tube out, patient verbalized understanding.

## 2020-05-07 NOTE — Progress Notes (Signed)
Abd xray verified NG tube in correct location.  °

## 2020-05-07 NOTE — Progress Notes (Signed)
CH visited pt.'s rm. as follow up from visit on Saturday; pt. initially gone at procedure but fiance in rm.  Fiance said pt. has fluid that needed to be drained; 'I had no idea the alcohol could do this much damage,' she remarked.  Pt. drinks heavily and usually begins drinking daily after dropping fiance off at work; fiance reported pt.'s alcohol use increased after recent loss of his father to colon cancer; fiance believes pt.'s fr.'s death was related to the chemo being used to treat his cancer. Pt. arrived back from procedure midway through visit.  Pt. lying in bed, able to speak but very drowsy.  pt. requested water --> CH checked w/RN: pt. cannot drink @ this time b/c further surgery is possible.  No further needs expressed at this time, but fiance grateful for [facility] support.    05/07/20 1210  Clinical Encounter Type  Visited With Patient and family together;Family  Visit Type Follow-up;Psychological support;Social support;Critical Care  Spiritual Encounters  Spiritual Needs Emotional  Stress Factors  Patient Stress Factors Health changes;Major life changes;Loss  Family Stress Factors Major life changes;Health changes

## 2020-05-07 NOTE — Progress Notes (Signed)
Patient has pulled NG tube out for a second time this shift with safety mitts in place. Provider notified and states to wait and see what the attending recommends.

## 2020-05-07 NOTE — Procedures (Signed)
PROCEDURE SUMMARY:  Successful US guided paracentesis from right lateral abdomen.  Yielded 2.8 liters of yellow fluid.  No immediate complications.  Pt tolerated well.   Specimen was sent for labs.  EBL < 62mL  Hoyt Koch PA-C 05/07/2020 12:06 PM

## 2020-05-07 NOTE — Progress Notes (Signed)
SURGICAL PROGRESS NOTE   Hospital Day(s): 4.   Post op day(s):  Marland Kitchen   Interval History: Patient seen and examined, no acute events or new complaints overnight. Patient was found sleeping, arousable. Significant other at bedside.  She reported that there has been no change.  She denies any nausea or vomiting.  She denies patient has been complaining about pain.  Upon palpation of this, patient did not have any grimace.  Vital signs in last 24 hours: [min-max] current  Temp:  [97.9 F (36.6 C)-98.8 F (37.1 C)] 98.2 F (36.8 C) (06/28 0800) Pulse Rate:  [107-124] 122 (06/28 0900) Resp:  [10-23] 20 (06/28 0900) BP: (129-174)/(91-127) 140/107 (06/28 0900) SpO2:  [96 %-100 %] 100 % (06/28 0900)     Height: 6\' 1"  (185.4 cm) Weight: 63.5 kg BMI (Calculated): 18.47   Physical Exam:  Constitutional: no distress  Respiratory: breathing non-labored at rest  Cardiovascular: Tachycardic Gastrointestinal: soft, non-tender, and distended  Labs:  CBC Latest Ref Rng & Units 05/07/2020 05/05/2020 05/04/2020  WBC 4.0 - 10.5 K/uL 14.3(H) 9.7 10.1  Hemoglobin 13.0 - 17.0 g/dL 10.9(L) 11.9(L) 13.0  Hematocrit 39 - 52 % 30.5(L) 33.6(L) 35.9(L)  Platelets 150 - 400 K/uL 173 108(L) 86(L)   CMP Latest Ref Rng & Units 05/07/2020 05/06/2020 05/05/2020  Glucose 70 - 99 mg/dL 05/07/2020) 382(N) 053(Z)  BUN 6 - 20 mg/dL 13 15 19   Creatinine 0.61 - 1.24 mg/dL 767(H 4.19  Sodium 135 - 145 mmol/L 124(L) 125(L) 123(L)  Potassium 3.5 - 5.1 mmol/L 4.5 4.7 4.3  Chloride 98 - 111 mmol/L 94(L) 93(L) 95(L)  CO2 22 - 32 mmol/L 18(L) 22 19(L)  Calcium 8.9 - 10.3 mg/dL 7.6(L) 7.6(L) 7.1(L)  Total Protein 6.5 - 8.1 g/dL 4.7(L) 5.0(L) 4.8(L)  Total Bilirubin 0.3 - 1.2 mg/dL 2.1(H) 2.1(H) 1.7(H)  Alkaline Phos 38 - 126 U/L 61 60 56  AST 15 - 41 U/L 41 43(H) 58(H)  ALT 0 - 44 U/L 22 23 24     Imaging studies: No new pertinent imaging studies   Assessment/Plan:  34 y.o. male with acute over chronic pancreatitis with  pseudocyst, complicated by pertinent comorbidities including alcohol withdrawal.  Patient without any clinical deterioration.  Continue with adequate renal function.  There is no respiratory distress.  Continue hypertensive.  Patient continue with elevated intra-abdominal pressure, this morning was 19.  There is no sign of organ compromise.  Continue with class II intra-abdominal hypertension without complication.  Agree with current medical management trying to avoid positive fluid balance.  Agree with paracentesis.  No indication for urgent surgical management at this moment.  We will continue to follow along.  0.24, MD

## 2020-05-07 NOTE — Progress Notes (Signed)
PROGRESS NOTE    Shane Loughner Sr.  EGB:[id number] DOB: Jul 07, 1986 DOA: 05/03/2020 PCP: Lorelee Market, MD    Brief Narrative:  34 year old gentleman with no medical history, chronic alcoholism, has been drinking nonstop about 12 packs of beer daily since age of 70 presented to the emergency room for evaluation of progressive confusion, lethargy.  He also had episode of unresponsiveness in the triage area.  As per patient's fianc, patient stopped drinking about 24 hours prior to arrival and started behaving altered, confused.  At the same time, patient has multiple episodes of loose watery stool, some of them dark appearing.  No nausea or vomiting.  In the emergency room, he was sick looking.  Blood pressure stable.  Tachycardic with heart rate 141.  Respiratory 21.  Afebrile.  100% on room air. Sodium 124, potassium was 2.8, creatinine 1.75, bilirubin 2.  Anion gap 19.  CK 700.  Chest x-ray was essentially normal.  CT head was normal.  Patient suspected to have acute alcohol withdrawal and admitted to the hospital.   Assessment & Plan:   Principal Problem:   Alcohol withdrawal seizure with delirium ([place]) Active Problems:   Hyponatremia   Hypokalemia   AKI (acute kidney injury) ([place])   Elevated CK   Heme positive stool   Thrombocytopenia ([facility])  Alcohol withdrawal with delirium: Alcoholism and alcohol use disorder. Currently on tapering dose of Librium that we will continue.  Also on as needed benzodiazepine.  On high-dose multivitamin.  More than alcohol withdrawal symptoms, he has abdominal symptoms.  Abdominal pain/diarrhea/hypotension and tachycardia: Acute alcoholic pancreatitis.  No gallbladder abnormalities. Symptomatic treatment.  Still with significant symptoms.  N.p.o. today.  Adequate pain medications. Suspect acute on chronic pancreatitis. With worsening abdominal distention, surgery was consulted. Intra-abdominal pressure monitoring shows intra-abdominal pressure less  than 20 with no hemodynamic instability.  No endorgan damage.  As needed measurement. Did not tolerate NG tube, no bowel obstruction.  Do not reinsert. Paracentesis was done today, labs are pending.  Neutrophil counts is high, more than 3500, will treat him with antibiotics for presumed peritonitis until cultures results available. Start meropenem. If prolonged n.p.o. for next 24 to 48 hours, will insert postpyloric feeding tube and start feeding him.  Hypervolemic hyponatremia: Continue isotonic fluid resuscitation. Low but stable.  Acute renal failure: Improved.  No obstruction.  Adequate urine output.   Hypokalemia: Replaced with improvement.  Magnesium and phosphorus improved.  DVT prophylaxis: SCDs Start: 05/03/20 2153   Code Status: Full code Family Communication: Patient's fianc at bedside. Disposition Plan: Status is: Inpatient  Remains inpatient appropriate because:Persistent severe electrolyte disturbances, IV treatments appropriate due to intensity of illness or inability to take PO and Inpatient level of care appropriate due to severity of illness   Dispo: The patient is from: Home              Anticipated d/c is to: Home              Anticipated d/c date is: > 3 days              Patient currently is not medically stable to d/c.         Consultants:   General surgery.  Procedures:   Paracentesis.  2.8 L peritoneal fluid removed.  Cultures pending.  Antimicrobials:   Meropenem 1 g every 8 hours daily, start today.   Subjective:  Patient seen and examined.  Overnight events noted.  They attempted to put NG tube in with  patient appearing uncomfortable.  Denies any nausea vomiting.  Abdominal pain persist.  Afebrile. No bowel movements recorded. Intravesicular pressure sores less than 19, 16. Still tachycardic.  Blood pressures are stable.  Use Dilaudid at the middle of the night.  None since then.   Objective: Vitals:   05/07/20 1133 05/07/20 1200  05/07/20 1203 05/07/20 1300  BP: (!) 145/106 (!) 145/103 (!) 145/103 (!) 145/110  Pulse:  (!) 115 (!) 118 (!) 123  Resp:  19  20  Temp:  (!) 97.5 F (36.4 C)    TempSrc:  Oral    SpO2:  100% 100% 100%  Weight:      Height:        Intake/Output Summary (Last 24 hours) at 05/07/2020 1405 Last data filed at 05/07/2020 1200 Gross per 24 hour  Intake 1534.98 ml  Output 595 ml  Net 939.98 ml   Filed Weights   05/03/20 2232  Weight: 63.5 kg    Examination: Physical Exam Constitutional:      Comments: Sick looking, anxious and lethargic.  HENT:     Head: Normocephalic.  Eyes:     Pupils: Pupils are equal, round, and reactive to light.  Cardiovascular:     Rate and Rhythm: Regular rhythm. Tachycardia present.  Pulmonary:     Effort: Pulmonary effort is normal.  Abdominal:     General: Abdomen is flat.     Comments: Generalized tenderness.  No rigidity.  Bowel sounds sluggish.  Skin:    General: Skin is warm.  Neurological:     General: No focal deficit present.     Comments: Lethargic      Data Reviewed: I have personally reviewed following labs and imaging studies  CBC: Recent Labs  Lab 05/03/20 1942 05/04/20 0533 05/05/20 0507 05/07/20 0452  WBC 11.9* 10.1 9.7 14.3*  NEUTROABS 9.5*  --  7.3 10.2*  HGB 15.3 13.0 11.9* 10.9*  HCT 42.5 35.9* 33.6* 30.5*  MCV 95.3 94.2 94.1 93.6  PLT 110* 86* 108* 173   Basic Metabolic Panel: Recent Labs  Lab 05/03/20 1942 05/03/20 2131 05/04/20 0533 05/05/20 0507 05/06/20 0318 05/07/20 0452  NA 124*  --  126* 123* 125* 124*  K 2.8*  --  3.0* 4.3 4.7 4.5  CL 90*  --  92* 95* 93* 94*  CO2 15*  --  20* 19* 22 18*  GLUCOSE 162*  --  118* 142* 125* 107*  BUN 33*  --  37* 19 15 13   CREATININE 1.75*  --  1.55* 0.96 0.93 0.70  CALCIUM 6.8*  --  7.2* 7.1* 7.6* 7.6*  MG  --  2.4 2.6* 2.1  --  1.8  PHOS  --   --  4.0 2.5  --  3.5   GFR: Estimated Creatinine Clearance: 116.9 mL/min (by C-G formula based on SCr of 0.7  mg/dL). Liver Function Tests: Recent Labs  Lab 05/03/20 1942 05/04/20 0533 05/05/20 0507 05/06/20 0318 05/07/20 0452  AST 36 52* 58* 43* 41  ALT 18 20 24 23 22   ALKPHOS 64 58 56 60 61  BILITOT 2.1* 1.7* 1.7* 2.1* 2.1*  PROT 5.1* 5.2* 4.8* 5.0* 4.7*  ALBUMIN 2.4* 2.4* 2.2* 2.0* 1.8*   Recent Labs  Lab 05/04/20 0533 05/05/20 0507 05/06/20 0318 05/07/20 0452  LIPASE 481* 370* 463* 365*   No results for input(s): AMMONIA in the last 168 hours. Coagulation Profile: Recent Labs  Lab 05/03/20 1942  INR 1.1   Cardiac Enzymes: Recent Labs  Lab 05/03/20 1942 05/04/20 0533  CKTOTAL 738* 1,440*   BNP (last 3 results) No results for input(s): PROBNP in the last 8760 hours. HbA1C: No results for input(s): HGBA1C in the last 72 hours. CBG: Recent Labs  Lab 05/06/20 1944 05/06/20 2123 05/07/20 0004 05/07/20 0418 05/07/20 0731  GLUCAP 74 102* 84 96 92   Lipid Profile: No results for input(s): CHOL, HDL, LDLCALC, TRIG, CHOLHDL, LDLDIRECT in the last 72 hours. Thyroid Function Tests: No results for input(s): TSH, T4TOTAL, FREET4, T3FREE, THYROIDAB in the last 72 hours. Anemia Panel: Recent Labs    05/06/20 0317 05/06/20 0318  VITAMINB12 505  --   FOLATE  --  8.3   Sepsis Labs: Recent Labs  Lab 05/03/20 2131 05/04/20 0534  LATICACIDVEN 2.8* 2.1*    Recent Results (from the past 240 hour(s))  SARS Coronavirus 2 by RT PCR (hospital order, performed in [facility] hospital lab) Nasopharyngeal Nasopharyngeal Swab     Status: None   Collection Time: 05/03/20  8:25 PM   Specimen: Nasopharyngeal Swab  Result Value Ref Range Status   SARS Coronavirus 2 NEGATIVE NEGATIVE Final    Comment: (NOTE) SARS-CoV-2 target nucleic acids are NOT DETECTED.  The SARS-CoV-2 RNA is generally detectable in upper and lower respiratory specimens during the acute phase of infection. The lowest concentration of SARS-CoV-2 viral copies this assay can detect is 250 copies / mL. A  negative result does not preclude SARS-CoV-2 infection and should not be used as the sole basis for treatment or other patient management decisions.  A negative result may occur with improper specimen collection / handling, submission of specimen other than nasopharyngeal swab, presence of viral mutation(s) within the areas targeted by this assay, and inadequate number of viral copies (<250 copies / mL). A negative result must be combined with clinical observations, patient history, and epidemiological information.  Fact Sheet for Patients:   BoilerBrush.com.cy  Fact Sheet for Healthcare Providers: https://pope.com/  This test is not yet approved or  cleared by the [place] FDA and has been authorized for detection and/or diagnosis of SARS-CoV-2 by FDA under an Emergency Use Authorization (EUA).  This EUA will remain in effect (meaning this test can be used) for the duration of the COVID-19 declaration under Section 564(b)(1) of the Act, 21 U.S.C. section 360bbb-3(b)(1), unless the authorization is terminated or revoked sooner.  Performed at [facility], [address] [place], [place] [address]   MRSA PCR Screening     Status: None   Collection Time: 05/03/20 11:57 PM   Specimen: Nasopharyngeal  Result Value Ref Range Status   MRSA by PCR NEGATIVE NEGATIVE Final    Comment:        The GeneXpert MRSA Assay (FDA approved for NASAL specimens only), is one component of a comprehensive MRSA colonization surveillance program. It is not intended to diagnose MRSA infection nor to guide or monitor treatment for MRSA infections. Performed at [facility], [address], [place], [place] [address]          Radiology Studies: DG Abd 1 View  Result Date: 05/07/2020 CLINICAL DATA:  NG tube placement. EXAM: ABDOMEN - 1 VIEW COMPARISON:  Radiograph yesterday FINDINGS: Tip and side port of the enteric tube  below the diaphragm in the stomach. Air within in the stomach and transverse colon. No evidence of free air. IMPRESSION: Tip and side port of the enteric tube below the diaphragm in the stomach. Electronically Signed   By: Shawna Orleans  Sanford M.D.   On: 05/07/2020 02:08   DG Abd 1 View  Result Date: 05/06/2020 CLINICAL DATA:  Nasogastric tube placement EXAM: ABDOMEN - 1 VIEW COMPARISON:  May 06, 2020 FINDINGS: The enteric tube projects over the stomach. The bowel gas pattern is nonspecific with mildly dilated loops of small bowel and colon scattered throughout the abdomen. Oral contrast is noted within the colon. There is no pneumatosis or free air. IMPRESSION: 1. Enteric tube projects over the stomach. 2. Mildly dilated loops of small bowel and colon scattered throughout the abdomen. Electronically Signed   By: Katherine Mantlehristopher  Green M.D.   On: 05/06/2020 23:00   DG Abd 1 View  Result Date: 05/06/2020 CLINICAL DATA:  Nasogastric tube placement EXAM: ABDOMEN - 1 VIEW COMPARISON:  Portable exam 1530 hours compared to 1101 hours FINDINGS: Nasogastric tube coiled in proximal stomach. Nonobstructive bowel gas pattern. Single nonspecific minimally prominent loop of small bowel in the LEFT mid abdomen. Osseous structures unremarkable. IMPRESSION: Nasogastric tube coiled in proximal stomach. Electronically Signed   By: Ulyses SouthwardMark  Boles M.D.   On: 05/06/2020 15:51   DG Abd 1 View  Result Date: 05/06/2020 CLINICAL DATA:  Abdominal distension. EXAM: ABDOMEN - 1 VIEW COMPARISON:  CT scan May 04, 2020 FINDINGS: There is contrast in the colon extending to the level the rectum. No small bowel dilatation. No free air, portal venous gas, or pneumatosis. No other acute abnormalities are identified. IMPRESSION: No evidence of bowel obstruction. No cause for bowel a distension identified on this study. Electronically Signed   By: Gerome Samavid  Williams III M.D   On: 05/06/2020 11:10   US Paracentesis  Result Date:  05/07/2020 INDICATION: Patient with history of abdominal pain, pancreatitis, abdominal distention, ascites. Request made for diagnostic and therapeutic paracentesis. EXAM: ULTRASOUND GUIDED DIAGNOSTIC AND THERAPEUTIC PARACENTESIS MEDICATIONS: 10 mL 1% lidocaine COMPLICATIONS: None immediate. PROCEDURE: Informed written consent was obtained from the patient after a discussion of the risks, benefits and alternatives to treatment. A timeout was performed prior to the initiation of the procedure. Initial ultrasound scanning demonstrates a small amount of ascites within the right lower abdominal quadrant. The right lower abdomen was prepped and draped in the usual sterile fashion. 1% lidocaine was used for local anesthesia. Following this, a 19 gauge, 7-cm, Yueh catheter was introduced. An ultrasound image was saved for documentation purposes. The paracentesis was performed. The catheter was removed and a dressing was applied. The patient tolerated the procedure well without immediate post procedural complication. FINDINGS: A total of approximately 2.8 liters of yellow fluid was removed. Samples were sent to the laboratory as requested by the clinical team. IMPRESSION: Successful ultrasound-guided paracentesis yielding 2.8 liters of peritoneal fluid. Read by: Loyce DysKacie Matthews PA-C Electronically Signed   By: Simonne ComeJohn  Watts M.D.   On: 05/07/2020 12:35         Scheduled Meds:  chlordiazePOXIDE  25 mg Per Tube [id number]   Followed by   chlordiazePOXIDE  25 mg Per Tube Daily   Chlorhexidine Gluconate Cloth  6 each Topical Daily   folic acid  1 mg Oral Daily   multivitamin with minerals  1 tablet Oral Daily   pantoprazole (PROTONIX) IV  40 mg Intravenous Q12H   sodium chloride flush  3 mL Intravenous Q12H   thiamine  100 mg Oral Daily   Or   thiamine  100 mg Intravenous Daily   Continuous Infusions:  sodium chloride Stopped (05/07/20 0726)   meropenem (MERREM) IV  LOS: 4 days    Time  spent: 30 minutes    Dorcas Carrow, MD [facility] Pager [phone]

## 2020-05-08 LAB — GLUCOSE, CAPILLARY
Glucose-Capillary: 100 mg/dL — ABNORMAL HIGH (ref 70–99)
Glucose-Capillary: 111 mg/dL — ABNORMAL HIGH (ref 70–99)
Glucose-Capillary: 113 mg/dL — ABNORMAL HIGH (ref 70–99)
Glucose-Capillary: 93 mg/dL (ref 70–99)
Glucose-Capillary: 94 mg/dL (ref 70–99)
Glucose-Capillary: 96 mg/dL (ref 70–99)

## 2020-05-08 LAB — CBC WITH DIFFERENTIAL/PLATELET
Abs Immature Granulocytes: 0.38 10*3/uL — ABNORMAL HIGH (ref 0.00–0.07)
Basophils Absolute: 0 10*3/uL (ref 0.0–0.1)
Basophils Relative: 0 %
Eosinophils Absolute: 0.2 10*3/uL (ref 0.0–0.5)
Eosinophils Relative: 1 %
HCT: 31.4 % — ABNORMAL LOW (ref 39.0–52.0)
Hemoglobin: 11 g/dL — ABNORMAL LOW (ref 13.0–17.0)
Immature Granulocytes: 2 %
Lymphocytes Relative: 2 %
Lymphs Abs: 0.3 10*3/uL — ABNORMAL LOW (ref 0.7–4.0)
MCH: 33.2 pg (ref 26.0–34.0)
MCHC: 35 g/dL (ref 30.0–36.0)
MCV: 94.9 fL (ref 80.0–100.0)
Monocytes Absolute: 1.9 10*3/uL — ABNORMAL HIGH (ref 0.1–1.0)
Monocytes Relative: 11 %
Neutro Abs: 14.4 10*3/uL — ABNORMAL HIGH (ref 1.7–7.7)
Neutrophils Relative %: 84 %
Platelets: 213 10*3/uL (ref 150–400)
RBC: 3.31 MIL/uL — ABNORMAL LOW (ref 4.22–5.81)
RDW: 12.2 % (ref 11.5–15.5)
Smear Review: NORMAL
WBC Morphology: INCREASED
WBC: 17.2 10*3/uL — ABNORMAL HIGH (ref 4.0–10.5)
nRBC: 0 % (ref 0.0–0.2)

## 2020-05-08 LAB — MAGNESIUM: Magnesium: 1.7 mg/dL (ref 1.7–2.4)

## 2020-05-08 LAB — COMPREHENSIVE METABOLIC PANEL
ALT: 25 U/L (ref 0–44)
AST: 54 U/L — ABNORMAL HIGH (ref 15–41)
Albumin: 1.8 g/dL — ABNORMAL LOW (ref 3.5–5.0)
Alkaline Phosphatase: 114 U/L (ref 38–126)
Anion gap: 13 (ref 5–15)
BUN: 13 mg/dL (ref 6–20)
CO2: 20 mmol/L — ABNORMAL LOW (ref 22–32)
Calcium: 7.5 mg/dL — ABNORMAL LOW (ref 8.9–10.3)
Chloride: 96 mmol/L — ABNORMAL LOW (ref 98–111)
Creatinine, Ser: 0.79 mg/dL (ref 0.61–1.24)
GFR calc Af Amer: >60 mL/min (ref >60)
GFR calc non Af Amer: >60 mL/min (ref >60)
Glucose, Bld: 100 mg/dL — ABNORMAL HIGH (ref 70–99)
Potassium: 4.3 mmol/L (ref 3.5–5.1)
Sodium: 129 mmol/L — ABNORMAL LOW (ref 135–145)
Total Bilirubin: 2.3 mg/dL — ABNORMAL HIGH (ref 0.3–1.2)
Total Protein: 4.6 g/dL — ABNORMAL LOW (ref 6.5–8.1)

## 2020-05-08 LAB — LIPASE, BLOOD: Lipase: 552 U/L — ABNORMAL HIGH (ref 11–51)

## 2020-05-08 LAB — PHOSPHORUS: Phosphorus: 3.1 mg/dL (ref 2.5–4.6)

## 2020-05-08 MED ORDER — LORAZEPAM 2 MG/ML IJ SOLN
1.0000 mg | Freq: Once | INTRAMUSCULAR | Status: AC
  Administered 2020-05-09: 1 mg via INTRAVENOUS
  Filled 2020-05-08: qty 1

## 2020-05-08 NOTE — Progress Notes (Signed)
PROGRESS NOTE    Shane Canela Sr.  GYI:[id number] DOB: September 19, 1986 DOA: 05/03/2020 PCP: [name], MD    Brief Narrative:  34 year old gentleman with no medical history, chronic alcoholism, has been drinking nonstop about 12 packs of beer daily since age of 19 presented to the emergency room for evaluation of progressive confusion, lethargy.  He also had episode of unresponsiveness in the triage area.  As per patient's fianc, patient stopped drinking about 24 hours prior to arrival and started behaving altered, confused.  At the same time, patient has multiple episodes of loose watery stool, some of them dark appearing.  No nausea or vomiting.  In the emergency room, he was sick looking.  Blood pressure stable.  Tachycardic with heart rate 141.  Respiratory 21.  Afebrile.  100% on room air. Sodium 124, potassium was 2.8, creatinine 1.75, bilirubin 2.  Anion gap 19.  CK 700.  Chest x-ray was essentially normal.  CT head was normal.  Patient suspected to have acute alcohol withdrawal and admitted to the hospital.   Assessment & Plan:   Principal Problem:   Alcohol withdrawal seizure with delirium (HCC) Active Problems:   Hyponatremia   Hypokalemia   AKI (acute kidney injury) (HCC)   Elevated CK   Heme positive stool   Thrombocytopenia (HCC)  Alcohol withdrawal with delirium: Alcoholism and alcohol use disorder. Currently on tapering dose of Librium and mostly stabilizing. on as needed benzodiazepine. On high-dose multivitamin. More than alcohol withdrawal symptoms, he has abdominal symptoms.  Abdominal pain/diarrhea/hypotension and tachycardia: Acute alcoholic pancreatitis.  No gallbladder abnormalities on CT abdomen. Symptomatic treatment.  Some improvement of symptoms.  Lipase is still elevated, he has been using less pain medications.  No nausea or vomiting.  Will start patient on clears today.  Adequate pain medications.  With worsening abdominal distention, surgery was  consulted. Intra-abdominal pressure monitoring shows intra-abdominal pressure less than 20 with no hemodynamic instability.  No endorgan damage.   Paracentesis showed high neutrophil counts.  Cultures pending.  Treating with meropenem.  Decrease IV fluid to avoid fluid retention.  Hypervolemic hyponatremia: Continue isotonic fluid resuscitation. Low but stable.  Acute renal failure: Improved.  No obstruction.  Adequate urine output.   Hypokalemia: Replaced with improvement.  Magnesium and phosphorus improved.  DVT prophylaxis: SCDs Start: 05/03/20 2153   Code Status: Full code Family Communication: Patient's fianc at bedside. Disposition Plan: Status is: Inpatient  Remains inpatient appropriate because:Persistent severe electrolyte disturbances, IV treatments appropriate due to intensity of illness or inability to take PO and Inpatient level of care appropriate due to severity of illness   Dispo: The patient is from: Home              Anticipated d/c is to: Home              Anticipated d/c date is: > 3 days              Patient currently is not medically stable to d/c.  Start mobilizing.  Clears.  Can go to medical floor.  Discontinue Foley catheter, intra-abdominal pressure consistently less than 20.    Consultants:   General surgery.  Procedures:   Paracentesis.  2.8 L peritoneal fluid removed.  Cultures pending.  Antimicrobials:   Meropenem 1 g every 8 hours daily, 6/28   Subjective:  Patient seen and examined.  Overnight did not have much problems.  Still has some abdominal pain, however he thinks it is much better than before. Denies any  nausea vomiting.  He has no appetite. 2 loose stools overnight.  Objective: Vitals:   05/08/20 0800 05/08/20 0900 05/08/20 1200 05/08/20 1342  BP: (!) 153/117 (!) 172/117 136/88 (!) 150/104  Pulse: (!) 137 (!) 128 (!) 114 (!) 111  Resp: (!) 28 (!) 22 17 18   Temp: 98.5 F (36.9 C)   97.9 F (36.6 C)  TempSrc: Oral   Oral   SpO2: 100% 99% 100% 100%  Weight:      Height:        Intake/Output Summary (Last 24 hours) at 05/08/2020 1417 Last data filed at 05/08/2020 0800 Gross per 24 hour  Intake 1215.64 ml  Output 850 ml  Net 365.64 ml   Filed Weights   05/03/20 2232  Weight: 63.5 kg    Examination: Physical Exam Constitutional:      Comments: Sick looking, anxious and lethargic.  HENT:     Head: Normocephalic.  Eyes:     Pupils: Pupils are equal, round, and reactive to light.  Cardiovascular:     Rate and Rhythm: Regular rhythm. Tachycardia present.  Pulmonary:     Effort: Pulmonary effort is normal.  Abdominal:     General: Abdomen is flat.     Comments: Generalized tenderness.  No rigidity.  Bowel sounds sluggish.  Skin:    General: Skin is warm.  Neurological:     General: No focal deficit present.     Mental Status: He is oriented to person, place, and time.     Comments: Sick looking and lethargic.   Intravesicular pressure 17.   Data Reviewed: I have personally reviewed following labs and imaging studies  CBC: Recent Labs  Lab 05/03/20 1942 05/04/20 0533 05/05/20 0507 05/07/20 0452 05/08/20 0740  WBC 11.9* 10.1 9.7 14.3* 17.2*  NEUTROABS 9.5*  --  7.3 10.2* 14.4*  HGB 15.3 13.0 11.9* 10.9* 11.0*  HCT 42.5 35.9* 33.6* 30.5* 31.4*  MCV 95.3 94.2 94.1 93.6 94.9  PLT 110* 86* 108* 173 213   Basic Metabolic Panel: Recent Labs  Lab 05/03/20 1942 05/03/20 2131 05/04/20 0533 05/05/20 0507 05/06/20 0318 05/07/20 0452 05/08/20 0740  NA   < >  --  126* 123* 125* 124* 129*  K   < >  --  3.0* 4.3 4.7 4.5 4.3  CL   < >  --  92* 95* 93* 94* 96*  CO2   < >  --  20* 19* 22 18* 20*  GLUCOSE   < >  --  118* 142* 125* 107* 100*  BUN   < >  --  37* 19 15 13 13   CREATININE   < >  --  1.55* 0.96 0.93 0.70 0.79  CALCIUM   < >  --  7.2* 7.1* 7.6* 7.6* 7.5*  MG  --  2.4 2.6* 2.1  --  1.8 1.7  PHOS  --   --  4.0 2.5  --  3.5 3.1   < > = values in this interval not displayed.    GFR: Estimated Creatinine Clearance: 116.9 mL/min (by C-G formula based on SCr of 0.79 mg/dL). Liver Function Tests: Recent Labs  Lab 05/04/20 0533 05/05/20 0507 05/06/20 0318 05/07/20 0452 05/08/20 0740  AST 52* 58* 43* 41 54*  ALT 20 24 23 22 25   ALKPHOS 58 56 60 61 114  BILITOT 1.7* 1.7* 2.1* 2.1* 2.3*  PROT 5.2* 4.8* 5.0* 4.7* 4.6*  ALBUMIN 2.4* 2.2* 2.0* 1.8* 1.8*   Recent Labs  Lab  05/04/20 0533 05/05/20 0507 05/06/20 0318 05/07/20 0452 05/08/20 0740  LIPASE 481* 370* 463* 365* 552*   No results for input(s): AMMONIA in the last 168 hours. Coagulation Profile: Recent Labs  Lab 05/03/20 1942  INR 1.1   Cardiac Enzymes: Recent Labs  Lab 05/03/20 1942 05/04/20 0533  CKTOTAL 738* 1,440*   BNP (last 3 results) No results for input(s): PROBNP in the last 8760 hours. HbA1C: No results for input(s): HGBA1C in the last 72 hours. CBG: Recent Labs  Lab 05/07/20 1935 05/08/20 0043 05/08/20 0358 05/08/20 0748 05/08/20 1121  GLUCAP 91 100* 96 93 94   Lipid Profile: No results for input(s): CHOL, HDL, LDLCALC, TRIG, CHOLHDL, LDLDIRECT in the last 72 hours. Thyroid Function Tests: No results for input(s): TSH, T4TOTAL, FREET4, T3FREE, THYROIDAB in the last 72 hours. Anemia Panel: Recent Labs    05/06/20 0317 05/06/20 0318  VITAMINB12 505  --   FOLATE  --  8.3   Sepsis Labs: Recent Labs  Lab 05/03/20 2131 05/04/20 0534  LATICACIDVEN 2.8* 2.1*    Recent Results (from the past 240 hour(s))  SARS Coronavirus 2 by RT PCR (hospital order, [facility]) Nasopharyngeal Nasopharyngeal Swab     Status: None   Collection Time: 05/03/20  8:25 PM   Specimen: Nasopharyngeal Swab  Result Value Ref Range Status   SARS Coronavirus 2 NEGATIVE NEGATIVE Final    Comment: (NOTE) SARS-CoV-2 target nucleic acids are NOT DETECTED.  The SARS-CoV-2 RNA is generally detectable in upper and lower respiratory specimens during the acute phase  of infection. The lowest concentration of SARS-CoV-2 viral copies this assay can detect is 250 copies / mL. A negative result does not preclude SARS-CoV-2 infection and should not be used as the sole basis for treatment or other patient management decisions.  A negative result may occur with improper specimen collection / handling, submission of specimen other than nasopharyngeal swab, presence of viral mutation(s) within the areas targeted by this assay, and inadequate number of viral copies (<250 copies / mL). A negative result must be combined with clinical observations, patient history, and epidemiological information.  Fact Sheet for Patients:   BoilerBrush.com.cyhttps://www.fda.gov/media/136312/download  Fact Sheet for Healthcare Providers: https://pope.com/https://www.fda.gov/media/136313/download  This test is not yet approved or  cleared by the [place] FDA and has been authorized for detection and/or diagnosis of SARS-CoV-2 by FDA under an Emergency Use Authorization (EUA).  This EUA will remain in effect (meaning this test can be used) for the duration of the COVID-19 declaration under Section 564(b)(1) of the Act, 21 U.S.C. section 360bbb-3(b)(1), unless the authorization is terminated or revoked sooner.  Performed at [facility], [address] [place], [place] [address]   MRSA PCR Screening     Status: None   Collection Time: 05/03/20 11:57 PM   Specimen: Nasopharyngeal  Result Value Ref Range Status   MRSA by PCR NEGATIVE NEGATIVE Final    Comment:        The GeneXpert MRSA Assay (FDA approved for NASAL specimens only), is one component of a comprehensive MRSA colonization surveillance program. It is not intended to diagnose MRSA infection nor to guide or monitor treatment for MRSA infections. Performed at [facility], [address] [place], [place] [address]   Body fluid culture     Status: None (Preliminary result)   Collection Time: 05/07/20 12:00 PM    Specimen: PATH Cytology Peritoneal fluid  Result Value Ref Range Status   Specimen Description  Final    PERITONEAL Performed at [facility], [address] [place], [place] [address]    Special Requests   Final    PERITONEAL Performed at [facility], [address] [place], [place] [address]    Gram Stain   Final    WBC PRESENT,BOTH PMN AND MONONUCLEAR NO ORGANISMS SEEN CYTOSPIN SMEAR    Culture   Final    NO GROWTH < 24 HOURS Performed at [facility], [address] [place], [place] [address]    Report Status PENDING  Incomplete         Radiology Studies: DG Abd 1 View  Result Date: 05/07/2020 CLINICAL DATA:  NG tube placement. EXAM: ABDOMEN - 1 VIEW COMPARISON:  Radiograph yesterday FINDINGS: Tip and side port of the enteric tube below the diaphragm in the stomach. Air within in the stomach and transverse colon. No evidence of free air. IMPRESSION: Tip and side port of the enteric tube below the diaphragm in the stomach. Electronically Signed   By: Narda Rutherford M.D.   On: 05/07/2020 02:08   DG Abd 1 View  Result Date: 05/06/2020 CLINICAL DATA:  Nasogastric tube placement EXAM: ABDOMEN - 1 VIEW COMPARISON:  May 06, 2020 FINDINGS: The enteric tube projects over the stomach. The bowel gas pattern is nonspecific with mildly dilated loops of small bowel and colon scattered throughout the abdomen. Oral contrast is noted within the colon. There is no pneumatosis or free air. IMPRESSION: 1. Enteric tube projects over the stomach. 2. Mildly dilated loops of small bowel and colon scattered throughout the abdomen. Electronically Signed   By: Katherine Mantle M.D.   On: 05/06/2020 23:00   DG Abd 1 View  Result Date: 05/06/2020 CLINICAL DATA:  Nasogastric tube placement EXAM: ABDOMEN - 1 VIEW COMPARISON:  Portable exam 1530 hours compared to 1101 hours FINDINGS: Nasogastric tube coiled in proximal stomach. Nonobstructive bowel gas pattern. Single  nonspecific minimally prominent loop of small bowel in the LEFT mid abdomen. Osseous structures unremarkable. IMPRESSION: Nasogastric tube coiled in proximal stomach. Electronically Signed   By: Ulyses Southward M.D.   On: 05/06/2020 15:51   US Paracentesis  Result Date: 05/07/2020 INDICATION: Patient with history of abdominal pain, pancreatitis, abdominal distention, ascites. Request made for diagnostic and therapeutic paracentesis. EXAM: ULTRASOUND GUIDED DIAGNOSTIC AND THERAPEUTIC PARACENTESIS MEDICATIONS: 10 mL 1% lidocaine COMPLICATIONS: None immediate. PROCEDURE: Informed written consent was obtained from the patient after a discussion of the risks, benefits and alternatives to treatment. A timeout was performed prior to the initiation of the procedure. Initial ultrasound scanning demonstrates a small amount of ascites within the right lower abdominal quadrant. The right lower abdomen was prepped and draped in the usual sterile fashion. 1% lidocaine was used for local anesthesia. Following this, a 19 gauge, 7-cm, Yueh catheter was introduced. An ultrasound image was saved for documentation purposes. The paracentesis was performed. The catheter was removed and a dressing was applied. The patient tolerated the procedure well without immediate post procedural complication. FINDINGS: A total of approximately 2.8 liters of yellow fluid was removed. Samples were sent to the laboratory as requested by the clinical team. IMPRESSION: Successful ultrasound-guided paracentesis yielding 2.8 liters of peritoneal fluid. Read by: Loyce Dys PA-C Electronically Signed   By: Simonne Come M.D.   On: 05/07/2020 12:35         Scheduled Meds: . Chlorhexidine Gluconate Cloth  6 each Topical Daily  . folic acid  1 mg Oral Daily  .  multivitamin with minerals  1 tablet Oral Daily  . pantoprazole (PROTONIX) IV  40 mg Intravenous Q12H  . sodium chloride flush  3 mL Intravenous Q12H  . thiamine  100 mg Oral Daily   Or  .  thiamine  100 mg Intravenous Daily   Continuous Infusions: . sodium chloride 75 mL/hr at 05/08/20 0800  . meropenem (MERREM) IV Stopped (05/08/20 0735)     LOS: 5 days    Time spent: 30 minutes    Dorcas Carrow, MD [facility] Pager [phone]

## 2020-05-08 NOTE — TOC Progression Note (Signed)
Transition of Care (TOC) - Progression Note    Patient Details  Name: Shane Strollo Sr. MRN: [id number] Date of Birth: 1986/04/28  Transition of Care [facility]) CM/SW Contact  Allayne Butcher, RN Phone Number: 05/08/2020, 2:29 PM  Clinical Narrative:    RNCM received a consult for SA resources.  Patient arrived to the floor from the ICU this afternoon.  Patient refuses to open eyes and speak when [facility] introduced self.   SA resources left at the bedside.   RNCM will attempt to speak with patient again tomorrow.         Expected Discharge Plan and Services                                                 Social Determinants of Health (SDOH) Interventions    Readmission Risk Interventions No flowsheet data found.

## 2020-05-08 NOTE — Progress Notes (Signed)
SURGICAL PROGRESS NOTE   Hospital Day(s): 5.   Post op day(s):  Marland Kitchen   Interval History: Patient seen and examined, no acute events or new complaints overnight. Patient reports feeling well.  Today the patient is alert and cooperative.  Upon questioning about pain he denies any pain.  He report he had a bowel movement this morning.  Patient remove NG tube 3 times yesterday.  Vital signs in last 24 hours: [min-max] current  Temp:  [97.5 F (36.4 C)-98.3 F (36.8 C)] 98.3 F (36.8 C) (06/28 1600) Pulse Rate:  [108-130] 130 (06/29 0400) Resp:  [18-29] 27 (06/29 0600) BP: (136-158)/(93-112) 156/102 (06/29 0600) SpO2:  [100 %] 100 % (06/29 0400)     Height: 6\' 1"  (185.4 cm) Weight: 63.5 kg BMI (Calculated): 18.47   Physical Exam:  Constitutional: alert, cooperative and no distress  Respiratory: breathing non-labored at rest  Cardiovascular: Tachycardic Gastrointestinal: soft, non-tender, and distended  Labs:  CBC Latest Ref Rng & Units 05/07/2020 05/05/2020 05/04/2020  WBC 4.0 - 10.5 K/uL 14.3(H) 9.7 10.1  Hemoglobin 13.0 - 17.0 g/dL 10.9(L) 11.9(L) 13.0  Hematocrit 39 - 52 % 30.5(L) 33.6(L) 35.9(L)  Platelets 150 - 400 K/uL 173 108(L) 86(L)   CMP Latest Ref Rng & Units 05/07/2020 05/06/2020 05/05/2020  Glucose 70 - 99 mg/dL 05/07/2020) 960(A) 540(J)  BUN 6 - 20 mg/dL 13 15 19   Creatinine 0.61 - 1.24 mg/dL 811(B 1.47  Sodium 135 - 145 mmol/L 124(L) 125(L) 123(L)  Potassium 3.5 - 5.1 mmol/L 4.5 4.7 4.3  Chloride 98 - 111 mmol/L 94(L) 93(L) 95(L)  CO2 22 - 32 mmol/L 18(L) 22 19(L)  Calcium 8.9 - 10.3 mg/dL 7.6(L) 7.6(L) 7.1(L)  Total Protein 6.5 - 8.1 g/dL 4.7(L) 5.0(L) 4.8(L)  Total Bilirubin 0.3 - 1.2 mg/dL 2.1(H) 2.1(H) 1.7(H)  Alkaline Phos 38 - 126 U/L 61 60 56  AST 15 - 41 U/L 41 43(H) 58(H)  ALT 0 - 44 U/L 22 23 24     Imaging studies: Ultrasound-guided paracentesis.  There was significant amount of ascites identified on ultrasound.  I personally evaluated the  images.   Assessment/Plan:  34 y.o.malewith acute over chronic pancreatitis with pseudocyst, complicated by pertinent comorbidities includingalcohol withdrawal.  Patient consulted for suspected abdominal compartment syndrome.  Have been evaluating the patient for the last 3 days and there has been no clinical deterioration.  The patient has no respiratory distress, patient with adequate blood pressures, continue with adequate urine output and renal function.  Yesterday he had almost 3 L of ascites drained with paracentesis.  I think that dose 3 L are the main factor for his abdominal physical exam.  I do not considered that this patient is developing any type of compartment syndrome.  Patient had a bowel movement today.  I would recommend to start clear liquid diet and assess for toleration.  I would recommend to start treatment for ascites, may be some diuretics.  That will also start to balance his positive fluid status that has been more than 5 L during this admission.  No need of urgent surgical management at this moment.  8.29, MD

## 2020-05-09 DIAGNOSIS — N179 Acute kidney failure, unspecified: Secondary | ICD-10-CM

## 2020-05-09 DIAGNOSIS — E871 Hypo-osmolality and hyponatremia: Secondary | ICD-10-CM

## 2020-05-09 LAB — GLUCOSE, CAPILLARY
Glucose-Capillary: 123 mg/dL — ABNORMAL HIGH (ref 70–99)
Glucose-Capillary: 133 mg/dL — ABNORMAL HIGH (ref 70–99)
Glucose-Capillary: 129 mg/dL — ABNORMAL HIGH (ref 70–99)
Glucose-Capillary: 110 mg/dL — ABNORMAL HIGH (ref 70–99)
Glucose-Capillary: 129 mg/dL — ABNORMAL HIGH (ref 70–99)

## 2020-05-09 LAB — CYTOLOGY - NON PAP

## 2020-05-09 LAB — TRIGLYCERIDES, BODY FLUIDS: Triglycerides, Fluid: 17 mg/dL

## 2020-05-09 LAB — AMMONIA: Ammonia: 24 umol/L (ref 9–35)

## 2020-05-09 MED ORDER — SPIRONOLACTONE 25 MG PO TABS
25.0000 mg | ORAL_TABLET | Freq: Every day | ORAL | Status: DC
  Administered 2020-05-09 – 2020-05-15 (×8): 25 mg via ORAL
  Filled 2020-05-09 (×8): qty 1

## 2020-05-09 MED ORDER — FUROSEMIDE 10 MG/ML IJ SOLN
40.0000 mg | Freq: Once | INTRAMUSCULAR | Status: AC
  Administered 2020-05-09: 18:00:00 40 mg via INTRAVENOUS
  Filled 2020-05-09: qty 4

## 2020-05-09 NOTE — Progress Notes (Signed)
PT Cancellation Note  Patient Details Name: Shane Sanguinetti Sr. MRN: [id number] DOB: 08/09/1986   Cancelled Treatment:    Reason Eval/Treat Not Completed: Other (comment) Upon arrival to room, pt asleep and unable to rouse despite continuous verbal and tactile stimuli. Will attempt to see another time when appropriate.   Frederich Chick 05/09/2020, 2:11 PM

## 2020-05-09 NOTE — Progress Notes (Signed)
SURGICAL PROGRESS NOTE   Hospital Day(s): 6.   Post op day(s):  Marland Kitchen   Interval History: Patient seen and examined, no acute events or new complaints overnight.  Patient found sleeping.  Arousable.  No complaints.  Vital signs in last 24 hours: [min-max] current  Temp:  [97.3 F (36.3 C)-98.8 F (37.1 C)] 97.5 F (36.4 C) (06/30 1207) Pulse Rate:  [108-118] 108 (06/30 1207) Resp:  [17-22] 17 (06/30 1207) BP: (133-161)/(94-108) 148/108 (06/30 1207) SpO2:  [99 %-100 %] 99 % (06/30 1207)     Height: 6\' 1"  (185.4 cm) Weight: 63.5 kg BMI (Calculated): 18.47   Physical Exam:  Constitutional: alert, cooperative and no distress  Respiratory: breathing non-labored at rest  Cardiovascular: regular rate and sinus rhythm  Gastrointestinal: soft, non-tender, and distended  Labs:  CBC Latest Ref Rng & Units 05/08/2020 05/07/2020 05/05/2020  WBC 4.0 - 10.5 K/uL 17.2(H) 14.3(H) 9.7  Hemoglobin 13.0 - 17.0 g/dL 11.0(L) 10.9(L) 11.9(L)  Hematocrit 39 - 52 % 31.4(L) 30.5(L) 33.6(L)  Platelets 150 - 400 K/uL 213 173 108(L)   CMP Latest Ref Rng & Units 05/08/2020 05/07/2020 05/06/2020  Glucose 70 - 99 mg/dL 05/08/2020) 748(O) 707(E)  BUN 6 - 20 mg/dL 13 13 15   Creatinine 0.61 - 1.24 mg/dL 675(Q 4.92  Sodium 135 - 145 mmol/L 129(L) 124(L) 125(L)  Potassium 3.5 - 5.1 mmol/L 4.3 4.5 4.7  Chloride 98 - 111 mmol/L 96(L) 94(L) 93(L)  CO2 22 - 32 mmol/L 20(L) 18(L) 22  Calcium 8.9 - 10.3 mg/dL 7.5(L) 7.6(L) 7.6(L)  Total Protein 6.5 - 8.1 g/dL 4.6(L) 4.7(L) 5.0(L)  Total Bilirubin 0.3 - 1.2 mg/dL 2.3(H) 2.1(H) 2.1(H)  Alkaline Phos 38 - 126 U/L 114 61 60  AST 15 - 41 U/L 54(H) 41 43(H)  ALT 0 - 44 U/L 25 22 23     Imaging studies: No new pertinent imaging studies   Assessment/Plan:  34 y.o.malewith acute over chronic pancreatitis with pseudocyst, complicated by pertinent comorbidities includingalcohol withdrawal.  Patient transferred to regular ward yesterday.  There has been no clinical  deterioration.  Patient continued having adequate urine output.  No change on abdominal physical exam.  No respiratory distress.  No sign of organ failure.  I think that the tension of the abdominal cavity is due to ascites.  I agree with current medical management and continue avoiding positive fluid balance status.  Consider medical management of ascites control (e.g. diuretics).  No indication for urgent surgical management at this moment.  No contraindication to advance diet as tolerated.  0.10, MD

## 2020-05-09 NOTE — Progress Notes (Addendum)
PROGRESS NOTE    Shane Lumpkin Sr.   WLN:[id number]  DOB: August 04, 1986  PCP: [name], MD    DOA: 05/03/2020 LOS: 6   Brief Narrative   34 year old gentleman with no medical history, chronic alcoholism, has been drinking nonstop about 12 packs of beer daily since age of 42 presented to the ED on  05/03/2020 for evaluation of progressive confusion and lethargy.  He also had episode of unresponsiveness in the triage area.  As per patient's fianc, patient stopped drinking about 24 hours prior to arrival and started behaving altered, confused.  At the same time, patient has multiple episodes of loose watery stool, some of them dark appearing.  No nausea or vomiting.  In the ED, he was ill-appearing, BP stable, tachycardic with heart rate 141, respirations 21, and afebrile.  Labs were notable for sodium 124, potassium was 2.8, creatinine 1.75, bilirubin 2.  Anion gap 19.  CK 700.  Chest x-ray was essentially normal.  CT head was normal.    CT abdomen/pelvis showed:  Edema adjacent to head and body of pancreas question pancreatitis (recommend correlation with serum lipase/amylase levels), cystic lesions at the pancreas, largest at head 4.4 cm diameter, could represent pseudocysts in a patient with pancreatitis or cystic pancreatic neoplasms; recommend attention on follow-up imaging to ensure resolution, or may characterize by MR, moderate ascites with mild fatty infiltration of liver, small BILATERAL pleural effusions and minimal bibasilar atelectasis (other non-pertinent findings in report).   Admitted to the hospitalist service for further evaluation and management on acute alcohol withdrawal syndrome, acute pancreatitis and possible SBP.     Patient also with abdominal pain and diarrhea and worsening distention.  General surgery was consulted.  Intra-abdominal pressure monitoring < 20 and stable hemodynamics, no end organ damage.  Paracentesis on 6/28 yielded 2.8 L ascitic fluid with elevated  neutrophils.  Started on meropenem pending fluid cultures.       Assessment & Plan   Principal Problem:   Alcohol withdrawal seizure with delirium (HCC) Active Problems:   Hyponatremia   Hypokalemia   AKI (acute kidney injury) (HCC)   Elevated CK   Heme positive stool   Thrombocytopenia (HCC)    Acute metabolic encephalopathy -patient with waxing and waning periods of significant lethargy.  He is no longer receiving benzos for alcohol withdrawal.  CT head upon admission was negative for acute findings that showed generalized atrophy advanced for his age.  Will check ammonia.  UDS on admission was never done, please collect and process.  Alcohol withdrawal syndrome - present on admission Alcohol dependence / Alcohol use disorder Completed Librium taper.  Continue CIWA protocol for monitoring.  Will stick to 1 time doses of Ativan if needed as patient is extremely lethargic and seems no longer to be in withdrawal at this time.  Continue thiamine and folic acid.  Acute alcoholic pancreatitis -present on admission with abdominal pain, tachycardia,  diarrhea, hypotension.  No gallbladder abnormalities on CT abdomen.  Continue supportive care with IV hydration, pain control.  Clear liquid diet, will advance as tolerated Continue meropenem pending ascites fluid cultures.  Monitor abdominal exam. With worsening abdominal distention, surgery was consulted. Intra-abdominal pressure monitoring shows intra-abdominal pressure less than 20 with no hemodynamic instability.  No endorgan damage.    Hypervolemic hyponatremia -POA with sodium 124.   Suspect due to liver disease.  Monitor BMP.  Acute renal failure -present on admission with creatinine 1.75.  Likely prerenal azotemia in the setting of above.  Improved  with IV hydration.  Monitor BMP and urine output.  Hypokalemia -present on admission.  Replaced with improvement.    Monitor BMP, mag and Foss    DVT prophylaxis: SCDs Start:  05/03/20 2153   Diet:  Diet Orders (From admission, onward)    Start     Ordered   05/08/20 0854  Diet clear liquid Room service appropriate? Yes; Fluid consistency: Thin  Diet effective now       Question Answer Comment  Room service appropriate? Yes   Fluid consistency: Thin      05/08/20 0853            Code Status: Full Code    Subjective 05/09/20    Patient seen at bedside.  No acute events reported.  He is lethargic but with enough prompting will answer questions and fall right back to sleep.  He is fully oriented and currently denies any pain.   Disposition Plan & Communication   Status is: Inpatient  Remains inpatient appropriate because:Inpatient level of care appropriate due to severity of illness   Dispo: The patient is from: Home              Anticipated d/c is to: Home              Anticipated d/c date is: 3 days              Patient currently is not medically stable to d/c.   Family Communication: None at bedside during encounter, will attempt to call   Consults, Procedures, Significant Events   Consultants:   None  Procedures:   Paracentesis 6/28  Antimicrobials:   Meropenem  Objective   Vitals:   05/09/20 0338 05/09/20 0802 05/09/20 1124 05/09/20 1207  BP: (!) 152/101 (!) 133/94 (!) 133/95 (!) 148/108  Pulse: (!) 114 (!) 117 (!) 108 (!) 108  Resp: 17 18 18 17   Temp: 98 F (36.7 C) 98.4 F (36.9 C) 97.8 F (36.6 C) (!) 97.5 F (36.4 C)  TempSrc: Axillary Oral Axillary Oral  SpO2: 100% 100% 99% 99%  Weight:      Height:        Intake/Output Summary (Last 24 hours) at 05/09/2020 1525 Last data filed at 05/09/2020 1056 Gross per 24 hour  Intake 699.34 ml  Output 250 ml  Net 449.34 ml   Filed Weights   05/03/20 2232  Weight: 63.5 kg    Physical Exam:  General exam: Lethargic, no acute distress, underweight HEENT: Dry mucus membranes, hearing grossly normal  Respiratory system: Decreased breath sounds but clear, no  wheezes, rales or rhonchi, normal respiratory effort. Cardiovascular system: normal S1/S2, RRR, 2+ pitting lower extremity edema.   Gastrointestinal system: soft, tender epigastric area without rebound, distended. Central nervous system: A&O x3. no gross focal neurologic deficits, normal speech Extremities: moves all, no myosis, normal tone Skin: dry, intact, normal temperature, pale  Labs   Data Reviewed: I have personally reviewed following labs and imaging studies  CBC: Recent Labs  Lab 05/03/20 1942 05/04/20 0533 05/05/20 0507 05/07/20 0452 05/08/20 0740  WBC 11.9* 10.1 9.7 14.3* 17.2*  NEUTROABS 9.5*  --  7.3 10.2* 14.4*  HGB 15.3 13.0 11.9* 10.9* 11.0*  HCT 42.5 35.9* 33.6* 30.5* 31.4*  MCV 95.3 94.2 94.1 93.6 94.9  PLT 110* 86* 108* 173 213   Basic Metabolic Panel: Recent Labs  Lab 05/03/20 1942 05/03/20 2131 05/04/20 0533 05/05/20 0507 05/06/20 0318 05/07/20 0452 05/08/20 0740  NA   < >  --  126* 123* 125* 124* 129*  K   < >  --  3.0* 4.3 4.7 4.5 4.3  CL   < >  --  92* 95* 93* 94* 96*  CO2   < >  --  20* 19* 22 18* 20*  GLUCOSE   < >  --  118* 142* 125* 107* 100*  BUN   < >  --  37* 19 15 13 13   CREATININE   < >  --  1.55* 0.96 0.93 0.70 0.79  CALCIUM   < >  --  7.2* 7.1* 7.6* 7.6* 7.5*  MG  --  2.4 2.6* 2.1  --  1.8 1.7  PHOS  --   --  4.0 2.5  --  3.5 3.1   < > = values in this interval not displayed.   GFR: Estimated Creatinine Clearance: 116.9 mL/min (by C-G formula based on SCr of 0.79 mg/dL). Liver Function Tests: Recent Labs  Lab 05/04/20 0533 05/05/20 0507 05/06/20 0318 05/07/20 0452 05/08/20 0740  AST 52* 58* 43* 41 54*  ALT 20 24 23 22 25   ALKPHOS 58 56 60 61 114  BILITOT 1.7* 1.7* 2.1* 2.1* 2.3*  PROT 5.2* 4.8* 5.0* 4.7* 4.6*  ALBUMIN 2.4* 2.2* 2.0* 1.8* 1.8*   Recent Labs  Lab 05/04/20 0533 05/05/20 0507 05/06/20 0318 05/07/20 0452 05/08/20 0740  LIPASE 481* 370* 463* 365* 552*   No results for input(s): AMMONIA in the last  168 hours. Coagulation Profile: Recent Labs  Lab 05/03/20 1942  INR 1.1   Cardiac Enzymes: Recent Labs  Lab 05/03/20 1942 05/04/20 0533  CKTOTAL 738* 1,440*   BNP (last 3 results) No results for input(s): PROBNP in the last 8760 hours. HbA1C: No results for input(s): HGBA1C in the last 72 hours. CBG: Recent Labs  Lab 05/08/20 1959 05/08/20 2355 05/09/20 0358 05/09/20 0748 05/09/20 1149  GLUCAP 111* 113* 123* 133* 129*   Lipid Profile: No results for input(s): CHOL, HDL, LDLCALC, TRIG, CHOLHDL, LDLDIRECT in the last 72 hours. Thyroid Function Tests: No results for input(s): TSH, T4TOTAL, FREET4, T3FREE, THYROIDAB in the last 72 hours. Anemia Panel: No results for input(s): VITAMINB12, FOLATE, FERRITIN, TIBC, IRON, RETICCTPCT in the last 72 hours. Sepsis Labs: Recent Labs  Lab 05/03/20 2131 05/04/20 0534  LATICACIDVEN 2.8* 2.1*    Recent Results (from the past 240 hour(s))  SARS Coronavirus 2 by RT PCR (hospital order, performed in [facility] hospital lab) Nasopharyngeal Nasopharyngeal Swab     Status: None   Collection Time: 05/03/20  8:25 PM   Specimen: Nasopharyngeal Swab  Result Value Ref Range Status   SARS Coronavirus 2 NEGATIVE NEGATIVE Final    Comment: (NOTE) SARS-CoV-2 target nucleic acids are NOT DETECTED.  The SARS-CoV-2 RNA is generally detectable in upper and lower respiratory specimens during the acute phase of infection. The lowest concentration of SARS-CoV-2 viral copies this assay can detect is 250 copies / mL. A negative result does not preclude SARS-CoV-2 infection and should not be used as the sole basis for treatment or other patient management decisions.  A negative result may occur with improper specimen collection / handling, submission of specimen other than nasopharyngeal swab, presence of viral mutation(s) within the areas targeted by this assay, and inadequate number of viral copies (<250 copies / mL). A negative result must be  combined with clinical observations, patient history, and epidemiological information.  Fact Sheet for Patients:   [facility]  Fact Sheet for Healthcare Providers: 05/05/20  This test is not yet approved or  cleared by the [place] and has been authorized for detection and/or diagnosis of SARS-CoV-2 by FDA under an Emergency Use Authorization (EUA).  This EUA will remain in effect (meaning this test can be used) for the duration of the COVID-19 declaration under Section 564(b)(1) of the Act, 21 U.S.C. section 360bbb-3(b)(1), unless the authorization is terminated or revoked sooner.  Performed at [facility], [address] [place], [place] [address]   MRSA PCR Screening     Status: None   Collection Time: 05/03/20 11:57 PM   Specimen: Nasopharyngeal  Result Value Ref Range Status   MRSA by PCR NEGATIVE NEGATIVE Final    Comment:        The GeneXpert MRSA Assay (FDA approved for NASAL specimens only), is one component of a comprehensive MRSA colonization surveillance program. It is not intended to diagnose MRSA infection nor to guide or monitor treatment for MRSA infections. Performed at [facility], [address] [place], [place] [address]   Body fluid culture     Status: None (Preliminary result)   Collection Time: 05/07/20 12:00 PM   Specimen: PATH Cytology Peritoneal fluid  Result Value Ref Range Status   Specimen Description   Final    PERITONEAL Performed at [facility], [address], [place], [place] [address]    Special Requests   Final    PERITONEAL Performed at [facility], [address] [place], [place] [address]    Gram Stain   Final    WBC PRESENT,BOTH PMN AND MONONUCLEAR NO ORGANISMS SEEN CYTOSPIN SMEAR    Culture   Final    NO GROWTH 2 DAYS Performed at [facility], [address] [place], [place] [address]     Report Status PENDING  Incomplete      Imaging Studies   No results found.   Medications   Scheduled Meds: . Chlorhexidine Gluconate Cloth  6 each Topical Daily  . folic acid  1 mg Oral Daily  . multivitamin with minerals  1 tablet Oral Daily  . pantoprazole (PROTONIX) IV  40 mg Intravenous Q12H  . sodium chloride flush  3 mL Intravenous Q12H  . thiamine  100 mg Oral Daily   Or  . thiamine  100 mg Intravenous Daily   Continuous Infusions: . sodium chloride Stopped (05/09/20 0650)  . meropenem (MERREM) IV 1 g (05/09/20 1514)       LOS: 6 days    Time spent: 30 minutes    Pennie Banter, DO [facility]  05/09/2020, 3:25 PM    If 7PM-7AM, please contact night-coverage. How to contact the [facility] Attending or Consulting provider 7A - 7P or covering provider during after hours 7P -7A, for this patient?    1. Check the care team in [facility] and look for a) attending/consulting [facility] provider listed and b) the [facility] team listed 2. Log into [email or web address] and use [facility]'s universal password to access. If you do not have the password, please contact the hospital operator. 3. Locate the [facility] provider you are looking for under [facility] and page to a number that you can be directly reached. 4. If you still have difficulty reaching the provider, please page the [facility] (Director on Call) for the Hospitalists listed on amion for assistance.

## 2020-05-09 NOTE — Progress Notes (Signed)
OT Cancellation Note  Patient Details Name: Shane Mucha Sr. MRN: [id number] DOB: 03/26/1986   Cancelled Treatment:    Reason Eval/Treat Not Completed: Fatigue/lethargy limiting ability to participate  OT consult received and chart reviewed. Upon OT presenting to room, pt very difficult to awaken. Multiple attempts to encourage pt to attend with auditory and tactile stimuli with short bouts of eye opening. Ultimately, pt closes eyes and falls back asleep each time. PT and RN try to assist in waking pt at this time as well to no avail. Will f/u at later date/time when pt is more appropriate for participation.   Rejeana Brock, MS, OTR/L ascom [phone] 05/09/20, 2:20 PM

## 2020-05-09 NOTE — Hospital Course (Addendum)
34 year old gentleman with no medical history, chronic alcoholism, has been drinking nonstop about 12 packs of beer daily since age of 73 presented to the ED on  05/03/2020 for evaluation of progressive confusion and lethargy.  He also had episode of unresponsiveness in the triage area.  As per patient's fianc, patient stopped drinking about 24 hours prior to arrival and started behaving altered, confused.  Also reported multiple episodes diarrhea, but no nausea or vomiting.    In the ED, he was ill-appearing, BP stable, tachycardic with heart rate 141, respirations 21, and afebrile.  Labs were notable for sodium 124, potassium was 2.8, creatinine 1.75, bilirubin 2.  Anion gap 19.  CK 700.  Chest x-ray was essentially normal.  CT head was normal.  CT abdomen pelvis was consistent with pancreatitis with peripancreatic edema and multiple lesions that are most likely pseudocysts, moderate ascites with mild fatty  infiltration of liver, small BILATERAL pleural effusions and minimal bibasilar atelectasis (other non-pertinent findings in report).   Admitted to the hospitalist service for further evaluation and management on acute alcohol withdrawal syndrome, acute pancreatitis and possible SBP.

## 2020-05-10 ENCOUNTER — Inpatient Hospital Stay: Payer: Medicaid Other

## 2020-05-10 DIAGNOSIS — R000 Tachycardia, unspecified: Secondary | ICD-10-CM | POA: Diagnosis present

## 2020-05-10 DIAGNOSIS — G9341 Metabolic encephalopathy: Secondary | ICD-10-CM | POA: Diagnosis present

## 2020-05-10 DIAGNOSIS — E43 Unspecified severe protein-calorie malnutrition: Secondary | ICD-10-CM

## 2020-05-10 LAB — GLUCOSE, CAPILLARY
Glucose-Capillary: 111 mg/dL — ABNORMAL HIGH (ref 70–99)
Glucose-Capillary: 122 mg/dL — ABNORMAL HIGH (ref 70–99)
Glucose-Capillary: 115 mg/dL — ABNORMAL HIGH (ref 70–99)
Glucose-Capillary: 131 mg/dL — ABNORMAL HIGH (ref 70–99)

## 2020-05-10 LAB — CBC WITH DIFFERENTIAL/PLATELET
Abs Immature Granulocytes: 0.81 10*3/uL — ABNORMAL HIGH (ref 0.00–0.07)
Basophils Absolute: 0.2 10*3/uL — ABNORMAL HIGH (ref 0.0–0.1)
Basophils Relative: 1 %
Eosinophils Absolute: 0.3 10*3/uL (ref 0.0–0.5)
Eosinophils Relative: 2 %
HCT: 30.1 % — ABNORMAL LOW (ref 39.0–52.0)
Hemoglobin: 10.7 g/dL — ABNORMAL LOW (ref 13.0–17.0)
Immature Granulocytes: 4 %
Lymphocytes Relative: 3 %
Lymphs Abs: 0.6 10*3/uL — ABNORMAL LOW (ref 0.7–4.0)
MCH: 33.2 pg (ref 26.0–34.0)
MCHC: 35.5 g/dL (ref 30.0–36.0)
MCV: 93.5 fL (ref 80.0–100.0)
Monocytes Absolute: 1.4 10*3/uL — ABNORMAL HIGH (ref 0.1–1.0)
Monocytes Relative: 8 %
Neutro Abs: 15.3 10*3/uL — ABNORMAL HIGH (ref 1.7–7.7)
Neutrophils Relative %: 82 %
Platelets: 205 10*3/uL (ref 150–400)
RBC: 3.22 MIL/uL — ABNORMAL LOW (ref 4.22–5.81)
RDW: 12.2 % (ref 11.5–15.5)
Smear Review: NORMAL
WBC: 18.6 10*3/uL — ABNORMAL HIGH (ref 4.0–10.5)
nRBC: 0 % (ref 0.0–0.2)

## 2020-05-10 LAB — COMPREHENSIVE METABOLIC PANEL
ALT: 30 U/L (ref 0–44)
AST: 63 U/L — ABNORMAL HIGH (ref 15–41)
Albumin: 1.6 g/dL — ABNORMAL LOW (ref 3.5–5.0)
Alkaline Phosphatase: 143 U/L — ABNORMAL HIGH (ref 38–126)
Anion gap: 9 (ref 5–15)
BUN: 9 mg/dL (ref 6–20)
CO2: 25 mmol/L (ref 22–32)
Calcium: 7.8 mg/dL — ABNORMAL LOW (ref 8.9–10.3)
Chloride: 98 mmol/L (ref 98–111)
Creatinine, Ser: 0.54 mg/dL — ABNORMAL LOW (ref 0.61–1.24)
GFR calc Af Amer: >60 mL/min (ref >60)
GFR calc non Af Amer: >60 mL/min (ref >60)
Glucose, Bld: 115 mg/dL — ABNORMAL HIGH (ref 70–99)
Potassium: 3.6 mmol/L (ref 3.5–5.1)
Sodium: 132 mmol/L — ABNORMAL LOW (ref 135–145)
Total Bilirubin: 1 mg/dL (ref 0.3–1.2)
Total Protein: 4.4 g/dL — ABNORMAL LOW (ref 6.5–8.1)

## 2020-05-10 LAB — TROPONIN I (HIGH SENSITIVITY): Troponin I (High Sensitivity): 32 ng/L — ABNORMAL HIGH (ref <18)

## 2020-05-10 LAB — PHOSPHORUS: Phosphorus: 3.8 mg/dL (ref 2.5–4.6)

## 2020-05-10 LAB — LIPASE, FLUID: Lipase-Fluid: 53760 U/L

## 2020-05-10 LAB — LIPASE, BLOOD: Lipase: 825 U/L — ABNORMAL HIGH (ref 11–51)

## 2020-05-10 LAB — MAGNESIUM: Magnesium: 1.5 mg/dL — ABNORMAL LOW (ref 1.7–2.4)

## 2020-05-10 IMAGING — US US ABDOMEN LIMITED
1 series · 4 of 4 positions shown · non-contrast
Comparison: none

INDICATION: Ascites.

EXAM:
ULTRASOUND GUIDED PARACENTESIS
MEDICATIONS:
None.
COMPLICATIONS:
None immediate.
PROCEDURE:
See below.

[Series 1: us abdomen limited · 0.26mm/px · 4 of 4 slices shown]
[im 1/4]
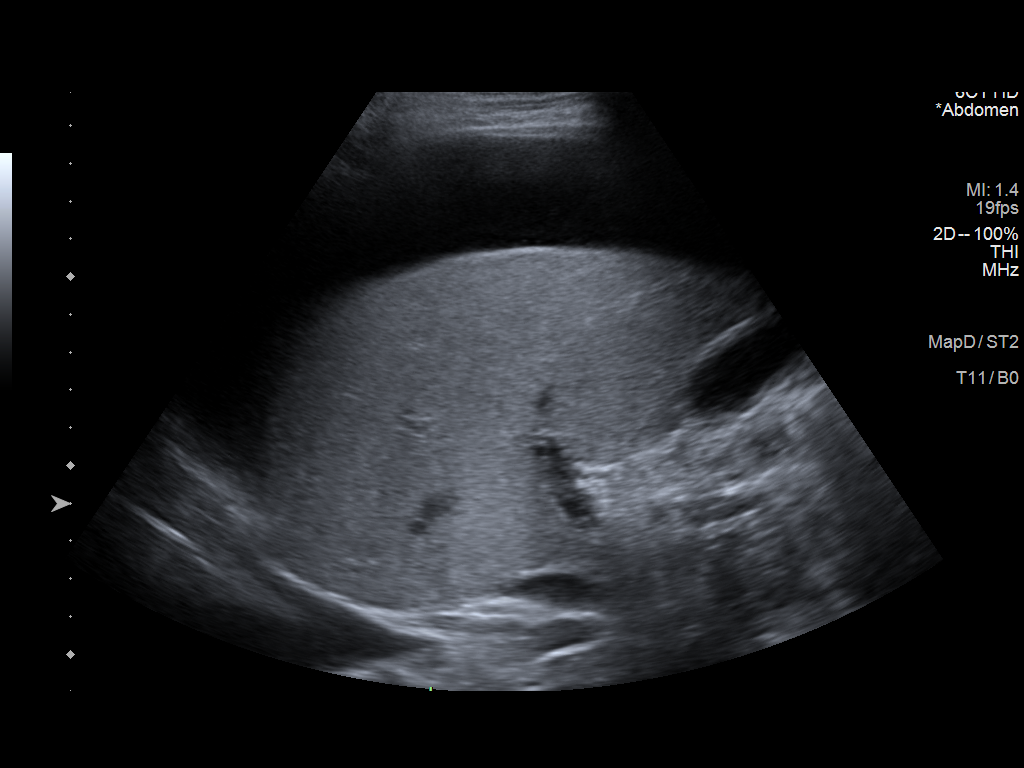
[im 2/4]
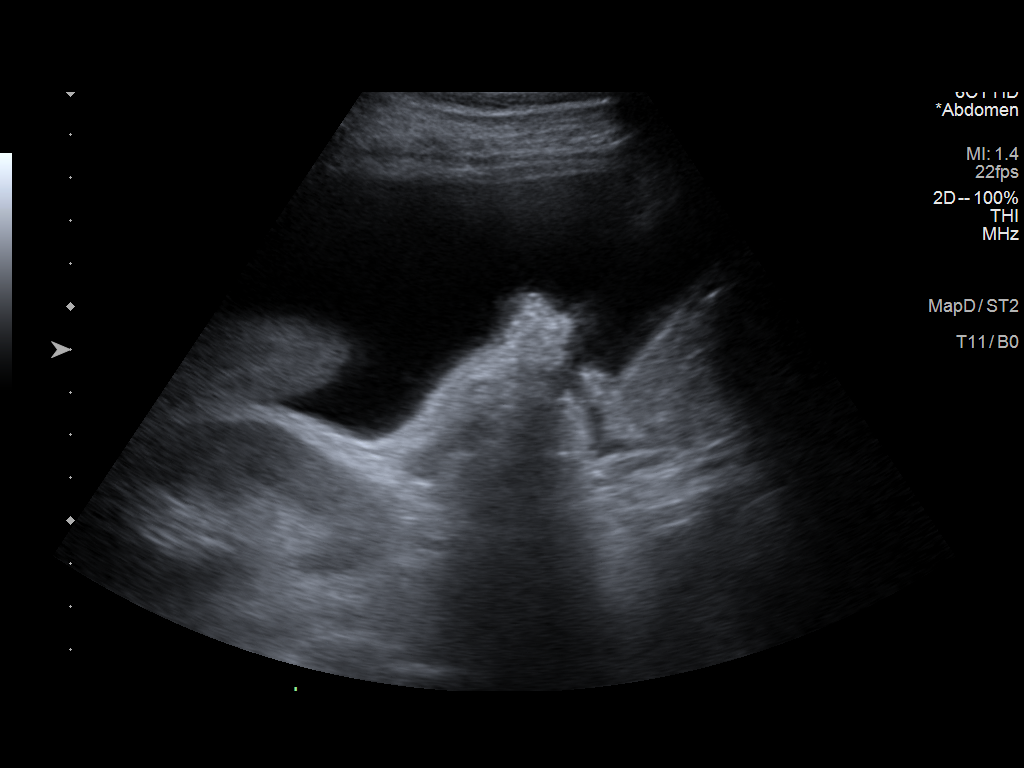
[im 3/4]
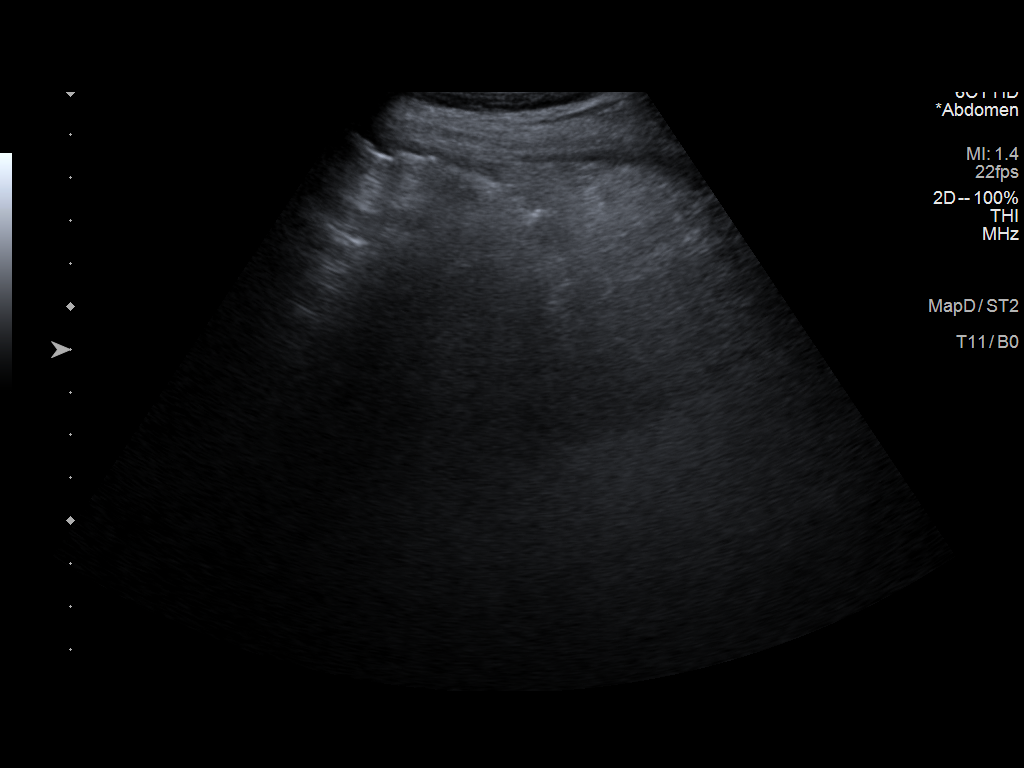
[im 4/4]
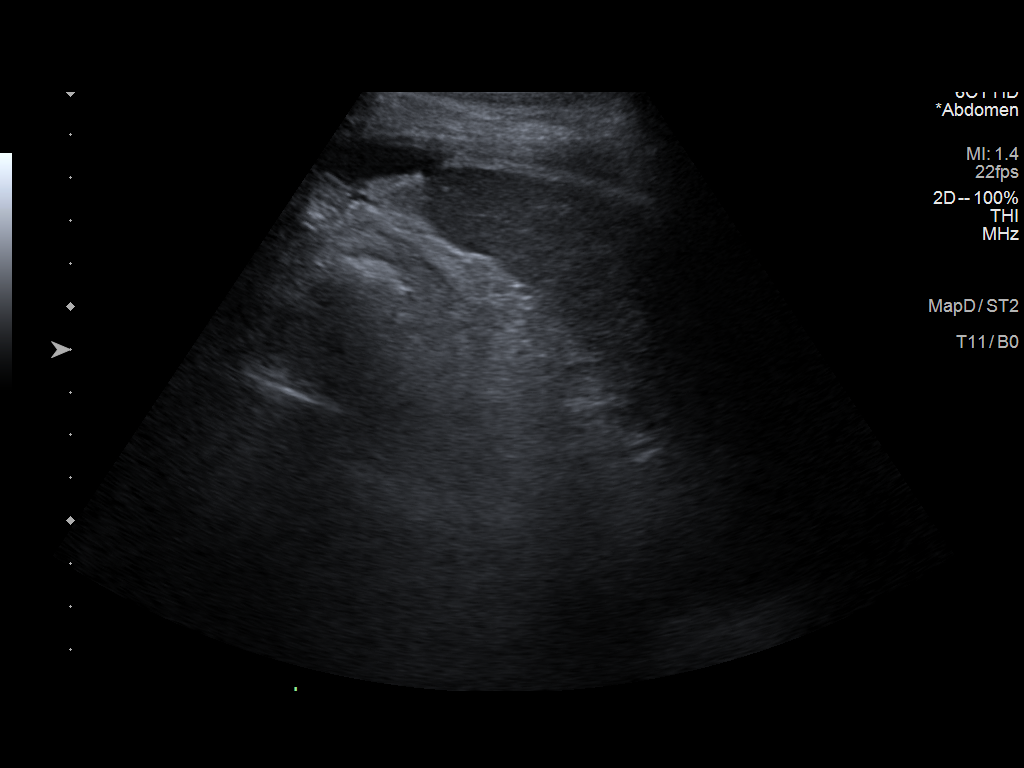

[4 of 4 positions shown; findings below may reference images not displayed]

FINDINGS: Only a small amount of ascites noted. Paracentesis was not
performed. Follow-up exam can be obtained.
IMPRESSION: Paracentesis not performed due to small amount of fluid.

## 2020-05-10 MED ORDER — MAGNESIUM SULFATE 4 GM/100ML IV SOLN
4.0000 g | Freq: Once | INTRAVENOUS | Status: AC
  Administered 2020-05-10: 09:00:00 4 g via INTRAVENOUS
  Filled 2020-05-10: qty 100

## 2020-05-10 MED ORDER — FUROSEMIDE 10 MG/ML IJ SOLN
40.0000 mg | Freq: Once | INTRAMUSCULAR | Status: AC
  Administered 2020-05-10: 40 mg via INTRAVENOUS
  Filled 2020-05-10: qty 4

## 2020-05-10 MED ORDER — CARVEDILOL 3.125 MG PO TABS
6.2500 mg | ORAL_TABLET | Freq: Two times a day (BID) | ORAL | Status: DC
  Administered 2020-05-10 – 2020-05-11 (×3): 6.25 mg via ORAL
  Filled 2020-05-10 (×3): qty 2

## 2020-05-10 NOTE — Progress Notes (Addendum)
Request to IR for therapeutic paracentesis - of note patient underwent diagnostic and therapeutic paracentesis the afternoon of 6/28 where all peritoneal fluid was removed.  Limited abdominal US of all four quadrants shows a small amount of perihepatic ascites which is potentially amenable to percutaneous drainage with increased risk of liver injury, however there was no other identifiable pocket of ascites which would account for his abdominal distention. Patient is somnolent but denies pain to me 3 times during exam, he reports recent bowel movement as well.  Procedure deferred at this time due to scant amount of perihepatic ascites which is unlikely to provide any therapeutic benefit if removed. Images from today's encounter are available for review under imaging section of Epic.  Please place a new order for paracentesis if it is felt this patient would benefit from a re-evaluation in the future.  Lynnette Caffey, PA-C

## 2020-05-10 NOTE — Progress Notes (Signed)
CH visited pt.'s fiance in rm. while pt. away at procedure.  Fiance said she is worried fluid is reaccumulating in pt.'s abdomen; she continues to be concerned at how seriously pt.'s drinking has affected his health and is not sure how pt. will be able to get sober when/if he is eventually discharged home.  Fiance said pt. has good relationship w/her son and that he has been asking 'when's daddy coming home?'  Pt. seems to recognize that his drinking has become dangerous --he has told his stepson many times never to touch a drink, fiance said. CH assesses pt. needs support and resources in addiction recovery after discharge but also grief support for the loss of his father this past April.  No further needs expressed at this time.    05/10/20 1130  Clinical Encounter Type  Visited With Family  Visit Type Follow-up;Psychological support;Social support  Spiritual Encounters  Spiritual Needs Emotional  Stress Factors  Family Stress Factors Loss of control;Lack of knowledge;Health changes

## 2020-05-10 NOTE — Progress Notes (Signed)
SURGICAL PROGRESS NOTE   Hospital Day(s): 7.   Post op day(s):  Marland Kitchen   Interval History: Patient seen and examined, no acute events or new complaints overnight. Patient again found sleepy.  Today not too cooperative.  No grimace upon evaluation  Vital signs in last 24 hours: [min-max] current  Temp:  [97.7 F (36.5 C)-98.4 F (36.9 C)] 98.4 F (36.9 C) (07/01 1150) Pulse Rate:  [101-124] 113 (07/01 1150) Resp:  [16-20] 19 (07/01 1150) BP: (115-169)/(87-113) 140/101 (07/01 1150) SpO2:  [98 %-100 %] 98 % (07/01 1150)     Height: 6\' 1"  (185.4 cm) Weight: 63.5 kg BMI (Calculated): 18.47   Physical Exam:  Constitutional: No distress  Respiratory: breathing non-labored at rest  Gastrointestinal: soft, non-tender. Tense but depressible.  Labs:  CBC Latest Ref Rng & Units 05/10/2020 05/08/2020 05/07/2020  WBC 4.0 - 10.5 K/uL 18.6(H) 17.2(H) 14.3(H)  Hemoglobin 13.0 - 17.0 g/dL 10.7(L) 11.0(L) 10.9(L)  Hematocrit 39 - 52 % 30.1(L) 31.4(L) 30.5(L)  Platelets 150 - 400 K/uL 205 213 173   CMP Latest Ref Rng & Units 05/10/2020 05/08/2020 05/07/2020  Glucose 70 - 99 mg/dL 05/09/2020) 382(N) 053(Z)  BUN 6 - 20 mg/dL 9 13 13   Creatinine 0.61 - 1.24 mg/dL 767(H) 4.19(F  Sodium 135 - 145 mmol/L 132(L) 129(L) 124(L)  Potassium 3.5 - 5.1 mmol/L 3.6 4.3 4.5  Chloride 98 - 111 mmol/L 98 96(L) 94(L)  CO2 22 - 32 mmol/L 25 20(L) 18(L)  Calcium 8.9 - 10.3 mg/dL 7.8(L) 7.5(L) 7.6(L)  Total Protein 6.5 - 8.1 g/dL 7.90) 4.6(L) 4.7(L)  Total Bilirubin 0.3 - 1.2 mg/dL 1.0 2.40) 2.1(H)  Alkaline Phos 38 - 126 U/L 143(H) 114 61  AST 15 - 41 U/L 63(H) 54(H) 41  ALT 0 - 44 U/L 30 25 22     Imaging studies:      Assessment/Plan:  34 y.o.malewith acute over chronic pancreatitis with pseudocyst, complicated by pertinent comorbidities includingalcohol withdrawal.  Patient today not to cooperative, sleepy.  Upon the palpation of the abdomen there was no grimace.  I still think that most of these  abdominal tension is due to ascites.  Patient had ultrasound today and assessment was that the ascites was no significant for paracentesis.  On the 2 views on the right side of the abdomen I feel that there is a significant amount of ascites to at least cause these abdominal tension.  Either way, this abdominal junction is not causing any organ dysfunction so no need of surgical management.  I would recommend to continue medical management of ascites.  No contraindication to progress diet as tolerated.  9.7(D, MD

## 2020-05-10 NOTE — Progress Notes (Signed)
OT Cancellation Note  Patient Details Name: Shane Baranek Sr. MRN: [id number] DOB: 1986/08/26   Cancelled Treatment:    Reason Eval/Treat Not Completed: Patient at procedure or test/ unavailable  Pt off floor to ultrasound at this time. Will f/u as able for OT evaluation. Thank you.  Rejeana Brock, MS, OTR/L ascom [phone] 05/10/20, 11:37 AM

## 2020-05-10 NOTE — Progress Notes (Signed)
PT Cancellation Note  Patient Details Name: Shane Goldwater Sr. MRN: [id number] DOB: 02/15/1986   Cancelled Treatment:    Reason Eval/Treat Not Completed: Patient's level of consciousness Attempted to see pt x2 this afternoon with no luck.  Apparently he was able to briefly wake for neuro but has not really been able to keep his eyes open and with both PT and wife attempting to wake he we got little more than a vague groan.  Not appropriate/safe to try PT this date, will try back tomorrow as appropriate.   Malachi Pro, DPT 05/10/2020, 3:27 PM

## 2020-05-10 NOTE — Progress Notes (Addendum)
PROGRESS NOTE    Shane Word Sr.   GGY:[id number]  DOB: [birth year]  PCP: [name], MD    DOA: 05/03/2020 LOS: 7   Brief Narrative   34 year old gentleman with no medical history, chronic alcoholism, has been drinking nonstop about 12 packs of beer daily since age of 37 presented to the ED on  05/03/2020 for evaluation of progressive confusion and lethargy.  He also had episode of unresponsiveness in the triage area.  As per patient's fianc, patient stopped drinking about 24 hours prior to arrival and started behaving altered, confused.  At the same time, patient has multiple episodes of loose watery stool, some of them dark appearing.  No nausea or vomiting.  In the ED, he was ill-appearing, BP stable, tachycardic with heart rate 141, respirations 21, and afebrile.  Labs were notable for sodium 124, potassium was 2.8, creatinine 1.75, bilirubin 2.  Anion gap 19.  CK 700.  Chest x-ray was essentially normal.  CT head was normal.    CT abdomen/pelvis showed:  Edema adjacent to head and body of pancreas question pancreatitis (recommend correlation with serum lipase/amylase levels), cystic lesions at the pancreas, largest at head 4.4 cm diameter, could represent pseudocysts in a patient with pancreatitis or cystic pancreatic neoplasms; recommend attention on follow-up imaging to ensure resolution, or may characterize by MR, moderate ascites with mild fatty  infiltration of liver, small BILATERAL pleural effusions and minimal bibasilar atelectasis (other non-pertinent findings in report).   Admitted to the hospitalist service for further evaluation and management on acute alcohol withdrawal syndrome, acute pancreatitis and possible SBP.     Patient also with abdominal pain and diarrhea and worsening distention.  General surgery was consulted.  Intra-abdominal pressure monitoring < 20 and stable hemodynamics, no end organ damage.  Paracentesis on 6/28 yielded 2.8 L ascitic fluid with elevated  neutrophils.  Started on meropenem pending fluid cultures.       Assessment & Plan   Principal Problem:   Acute alcoholic pancreatitis Active Problems:   Acute metabolic encephalopathy   Severe protein-calorie malnutrition (HCC)   Alcohol withdrawal seizure with delirium (HCC)   Hyponatremia   Hypokalemia   AKI (acute kidney injury) (HCC)   Elevated CK   Heme positive stool   Thrombocytopenia (HCC)   Acute metabolic encephalopathy - patient with waxing and waning periods of significant lethargy since admission.  He is no longer receiving benzos for alcohol withdrawal.  CT head upon admission was negative for acute findings that showed generalized atrophy advanced for his age.  Exam is non-focal.  Ammonia level normal.  B12 normal.  UDS on admission was never done, please collect and process.  Will consult neurology, appreciate input.   Alcohol withdrawal syndrome - present on admission Alcohol dependence / Alcohol use disorder Completed Librium taper.  Continue CIWA protocol for monitoring.  Will stick to 1 time doses of Ativan if needed as patient is extremely lethargic and seems no longer to be in withdrawal at this time.  Continue thiamine and folic acid.  Acute alcoholic pancreatitis -present on admission with abdominal pain, tachycardia,  diarrhea, hypotension.  No gallbladder abnormalities on CT abdomen.  Continue supportive care with IV hydration, pain control.  Advance diet to full liquids, will advance as tolerated Discontinue meropenem given negative ascites fluid cultures now >48 hours.  Monitor abdominal exam.   With worsening abdominal distention, surgery was consulted.  Intra-abdominal pressure monitoring showed intra-abdominal pressure less than 20 with no hemodynamic instability.  No  endorgan damage.  Surgery recommends management of ascites.  Severe Protein Calorie Malnutrition - POA, due to chronic alcohol abuse and not eating regular food.  Now day 6 of admission  with minimal PO intake.  Dietician consulted.  May require tube feeding soon.  Daily CMP, Mg, Phos.   Sinus tachycardia - persistent, likely due to alcohol withdrawal and pain from pancreatitis.  No evidence of infection / sepsis.  Ascites fluid cultures have been negative.  Started low dose of Coreg for BP and HR control.  Continue to manage underlying issues as above.  Ascites - s/p paracentesis on 6/28 with 2.8 L fluid removed. Abdomen again distended, ordered repeat paracentesis, but not enough fluid seen on ultrasound to do safely.  Continue diuretics.    Chest pain - reported by pt on AM of 7/1.  Troponin mildly elevated at 32, likely demand ischemia from persistent tachycardia in setting of alcohol withdrawal.  No acute ischemic changes on EKG, only non-specific T wave changes.  Monitor.  Hypervolemic hyponatremia -POA with sodium 124.   Suspect due to liver disease.  Monitor BMP.  Improving with diuretics.  Acute renal failure -present on admission with creatinine 1.75.  Likely prerenal azotemia in the setting of above.  Improved with IV hydration.  Monitor BMP and urine output.  Hypokalemia -present on admission.  Replaced with improvement.    Monitor BMP, mag and Foss    DVT prophylaxis: SCDs Start: 05/03/20 2153   Diet:  Diet Orders (From admission, onward)    Start     Ordered   05/10/20 1446  Diet full liquid Room service appropriate? Yes; Fluid consistency: Thin  Diet effective now       Question Answer Comment  Room service appropriate? Yes   Fluid consistency: Thin      05/10/20 1445            Code Status: Full Code    Subjective 05/10/20    Patient seen at bedside with significant other and another family member present.  No acute events reported.  He is lethargic but with enough prompting will answer questions and fall right back to sleep.  He is fully oriented and currently denies any pain.   Disposition Plan & Communication   Status is:  Inpatient  Remains inpatient appropriate because:Inpatient level of care appropriate due to severity of illness   Dispo: The patient is from: Home              Anticipated d/c is to: Home              Anticipated d/c date is: 3 days              Patient currently is not medically stable to d/c.   Family Communication: family were at bedside during encounter. Discussed current clinical status and plans in detail, all questions answered.   Consults, Procedures, Significant Events   Consultants:   None  Procedures:   Paracentesis 6/28  Antimicrobials:   Meropenem  Objective   Vitals:   05/10/20 0801 05/10/20 0912 05/10/20 0934 05/10/20 1150  BP: (!) 132/97 (!) 134/102 (!) 129/95 (!) 140/101  Pulse: (!) 117 (!) 119 (!) 117 (!) 113  Resp: 18  17 19   Temp: 98.2 F (36.8 C)  98.2 F (36.8 C) 98.4 F (36.9 C)  TempSrc:    Oral  SpO2: 100% 100% 98% 98%  Weight:      Height:        Intake/Output  Summary (Last 24 hours) at 05/10/2020 1514 Last data filed at 05/10/2020 0201 Gross per 24 hour  Intake --  Output 750 ml  Net -750 ml   Filed Weights   05/03/20 2232  Weight: 63.5 kg    Physical Exam:  General exam: Lethargic but little more responsive today, no acute distress, underweight HEENT: Dry mucus membranes, hearing grossly normal, pale lips  Respiratory system: Decreased breath sounds but clear, normal respiratory effort. Cardiovascular system: normal S1/S2, RRR, 2+ pitting lower extremity edema.   Gastrointestinal system: distended, mildly tender diffusely on palpation without rebound. Central nervous system: Ox3. no gross focal neurologic deficits, normal speech Extremities: moves all, no myosis, normal tone Skin: dry, intact, normal temperature, pale  Labs   Data Reviewed: I have personally reviewed following labs and imaging studies  CBC: Recent Labs  Lab 05/03/20 1942 05/03/20 1942 05/04/20 0533 05/05/20 0507 05/07/20 0452 05/08/20 0740  05/10/20 0520  WBC 11.9*   < > 10.1 9.7 14.3* 17.2* 18.6*  NEUTROABS 9.5*  --   --  7.3 10.2* 14.4* 15.3*  HGB 15.3   < > 13.0 11.9* 10.9* 11.0* 10.7*  HCT 42.5   < > 35.9* 33.6* 30.5* 31.4* 30.1*  MCV 95.3   < > 94.2 94.1 93.6 94.9 93.5  PLT 110*   < > 86* 108* 173 213 205   < > = values in this interval not displayed.   Basic Metabolic Panel: Recent Labs  Lab 05/04/20 0533 05/04/20 0533 05/05/20 0507 05/06/20 4259 05/07/20 0452 05/08/20 0740 05/10/20 0520  NA 126*   < > 123* 125* 124* 129* 132*  K 3.0*   < > 4.3 4.7 4.5 4.3 3.6  CL 92*   < > 95* 93* 94* 96* 98  CO2 20*   < > 19* 22 18* 20* 25  GLUCOSE 118*   < > 142* 125* 107* 100* 115*  BUN 37*   < > 19 15 13 13 9   CREATININE 1.55*   < > 0.96 0.93 0.70 0.79 0.54*  CALCIUM 7.2*   < > 7.1* 7.6* 7.6* 7.5* 7.8*  MG 2.6*  --  2.1  --  1.8 1.7 1.5*  PHOS 4.0  --  2.5  --  3.5 3.1 3.8   < > = values in this interval not displayed.   GFR: Estimated Creatinine Clearance: 116.9 mL/min (A) (by C-G formula based on SCr of 0.54 mg/dL (L)). Liver Function Tests: Recent Labs  Lab 05/05/20 0507 05/06/20 0318 05/07/20 0452 05/08/20 0740 05/10/20 0520  AST 58* 43* 41 54* 63*  ALT 24 23 22 25 30   ALKPHOS 56 60 61 114 143*  BILITOT 1.7* 2.1* 2.1* 2.3* 1.0  PROT 4.8* 5.0* 4.7* 4.6* 4.4*  ALBUMIN 2.2* 2.0* 1.8* 1.8* 1.6*   Recent Labs  Lab 05/05/20 0507 05/06/20 0318 05/07/20 0452 05/08/20 0740 05/10/20 0520  LIPASE 370* 463* 365* 552* 825*   Recent Labs  Lab 05/09/20 1544  AMMONIA 24   Coagulation Profile: Recent Labs  Lab 05/03/20 1942  INR 1.1   Cardiac Enzymes: Recent Labs  Lab 05/03/20 1942 05/04/20 0533  CKTOTAL 738* 1,440*   BNP (last 3 results) No results for input(s): PROBNP in the last 8760 hours. HbA1C: No results for input(s): HGBA1C in the last 72 hours. CBG: Recent Labs  Lab 05/09/20 1149 05/09/20 1650 05/09/20 2243 05/10/20 0751 05/10/20 1152  GLUCAP 129* 110* 129* 111* 122*    Lipid Profile: No results for input(s): CHOL,  HDL, LDLCALC, TRIG, CHOLHDL, LDLDIRECT in the last 72 hours. Thyroid Function Tests: No results for input(s): TSH, T4TOTAL, FREET4, T3FREE, THYROIDAB in the last 72 hours. Anemia Panel: No results for input(s): VITAMINB12, FOLATE, FERRITIN, TIBC, IRON, RETICCTPCT in the last 72 hours. Sepsis Labs: Recent Labs  Lab 05/03/20 2131 05/04/20 0534  LATICACIDVEN 2.8* 2.1*    Recent Results (from the past 240 hour(s))  SARS Coronavirus 2 by RT PCR (hospital order, performed in [facility] hospital lab) Nasopharyngeal Nasopharyngeal Swab     Status: None   Collection Time: 05/03/20  8:25 PM   Specimen: Nasopharyngeal Swab  Result Value Ref Range Status   SARS Coronavirus 2 NEGATIVE NEGATIVE Final    Comment: (NOTE) SARS-CoV-2 target nucleic acids are NOT DETECTED.  The SARS-CoV-2 RNA is generally detectable in upper and lower respiratory specimens during the acute phase of infection. The lowest concentration of SARS-CoV-2 viral copies this assay can detect is 250 copies / mL. A negative result does not preclude SARS-CoV-2 infection and should not be used as the sole basis for treatment or other patient management decisions.  A negative result may occur with improper specimen collection / handling, submission of specimen other than nasopharyngeal swab, presence of viral mutation(s) within the areas targeted by this assay, and inadequate number of viral copies (<250 copies / mL). A negative result must be combined with clinical observations, patient history, and epidemiological information.  Fact Sheet for Patients:   BoilerBrush.com.cy  Fact Sheet for Healthcare Providers: https://pope.com/  This test is not yet approved or  cleared by the [place] FDA and has been authorized for detection and/or diagnosis of SARS-CoV-2 by FDA under an Emergency Use Authorization (EUA).  This EUA  will remain in effect (meaning this test can be used) for the duration of the COVID-19 declaration under Section 564(b)(1) of the Act, 21 U.S.C. section 360bbb-3(b)(1), unless the authorization is terminated or revoked sooner.  Performed at [facility], [address] [place], [place] [address]   MRSA PCR Screening     Status: None   Collection Time: 05/03/20 11:57 PM   Specimen: Nasopharyngeal  Result Value Ref Range Status   MRSA by PCR NEGATIVE NEGATIVE Final    Comment:        The GeneXpert MRSA Assay (FDA approved for NASAL specimens only), is one component of a comprehensive MRSA colonization surveillance program. It is not intended to diagnose MRSA infection nor to guide or monitor treatment for MRSA infections. Performed at [facility], [address] [place], [place] [address]   Body fluid culture     Status: None (Preliminary result)   Collection Time: 05/07/20 12:00 PM   Specimen: PATH Cytology Peritoneal fluid  Result Value Ref Range Status   Specimen Description   Final    PERITONEAL Performed at [facility], [address], [place], [place] [address]    Special Requests   Final    PERITONEAL Performed at [facility], [address] [place], [place] [address]    Gram Stain   Final    WBC PRESENT,BOTH PMN AND MONONUCLEAR NO ORGANISMS SEEN CYTOSPIN SMEAR    Culture   Final    NO GROWTH 3 DAYS Performed at [facility], [address] [place], [place] [address]    Report Status PENDING  Incomplete      Imaging Studies   US Abdomen Limited  Result Date: 05/10/2020 INDICATION: Ascites. EXAM: ULTRASOUND GUIDED PARACENTESIS MEDICATIONS: None.  COMPLICATIONS: None immediate. PROCEDURE: See below. FINDINGS: Only a small amount of ascites noted. Paracentesis was not performed. Follow-up exam can be obtained. IMPRESSION: Paracentesis not performed due to small amount of fluid. Electronically Signed   By:  Maisie Fus  Register   On: 05/10/2020 11:27     Medications   Scheduled Meds: . carvedilol  6.25 mg Oral BID WC  . Chlorhexidine Gluconate Cloth  6 each Topical Daily  . folic acid  1 mg Oral Daily  . furosemide  40 mg Intravenous Once  . multivitamin with minerals  1 tablet Oral Daily  . pantoprazole (PROTONIX) IV  40 mg Intravenous Q12H  . sodium chloride flush  3 mL Intravenous Q12H  . spironolactone  25 mg Oral Daily  . thiamine  100 mg Oral Daily   Or  . thiamine  100 mg Intravenous Daily   Continuous Infusions:      LOS: 7 days    Time spent: 42 minutes total with > 50% in direct patient contact and in coordination of care.    Pennie Banter, DO [facility]  05/10/2020, 3:14 PM    If 7PM-7AM, please contact night-coverage. How to contact the [facility] Attending or Consulting provider 7A - 7P or covering provider during after hours 7P -7A, for this patient?    1. Check the care team in [facility] and look for a) attending/consulting [facility] provider listed and b) the [facility] team listed 2. Log into [email or web address] and use [place]'s universal password to access. If you do not have the password, please contact the hospital operator. 3. Locate the [facility] provider you are looking for under [facility] and page to a number that you can be directly reached. 4. If you still have difficulty reaching the provider, please page the [facility] (Director on Call) for the Hospitalists listed on amion for assistance.

## 2020-05-10 NOTE — Consult Note (Signed)
Reason for Consult:AMS Requesting Physician: Denton LankGriffith  CC: AMS  I have been asked by Dr. Denton LankGriffith to see this patient in consultation for AMS.  HPI: Shane SergeantLarry Sanford Sr. is an 34 y.o. male with a history of alcohol abuse who is unable to provide history due to lethargy therefore all history obtained from the chart.  Patient with a long history of ETOH abuse.  Abruptly stopped drinking the day before presentation (6/24) and became progressively confused.  Was brought to the ED by his wife and while in the ED was witnessed to have a seizure.  Patient has not had further seizures but has remained confused and lethargic.    Past Medical History:  Diagnosis Date  . Alcohol use disorder, severe, dependence (HCC)     History reviewed. No pertinent surgical history.  Family History  Problem Relation Age of Onset  . Alcohol abuse Mother     Social History:  reports that he has been smoking. He has never used smokeless tobacco. He reports current alcohol use. He reports previous drug use.  Allergies  Allergen Reactions  . Sudafed [Pseudoephedrine]     History per pt of this being linked with his seizures    Medications:  I have reviewed the patient's current medications. Prior to Admission:  No medications prior to admission.   Scheduled: . carvedilol  6.25 mg Oral BID WC  . Chlorhexidine Gluconate Cloth  6 each Topical Daily  . folic acid  1 mg Oral Daily  . multivitamin with minerals  1 tablet Oral Daily  . pantoprazole (PROTONIX) IV  40 mg Intravenous Q12H  . sodium chloride flush  3 mL Intravenous Q12H  . spironolactone  25 mg Oral Daily  . thiamine  100 mg Oral Daily   Or  . thiamine  100 mg Intravenous Daily    ROS: Unable to provide due to mental status  Physical Examination: Blood pressure (!) 140/101, pulse (!) 113, temperature 98.4 F (36.9 C), temperature source Oral, resp. rate 19, height 6\' 1"  (1.854 m), weight 63.5 kg, SpO2 98 %.  HEENT-  Normocephalic, no  lesions, without obvious abnormality.  Jaundice.  Normal TM's bilaterally.  Normal external ears. Normal external nose, mucus membranes and septum.  Normal pharynx. Cardiovascular- S1, S2 normal, pulses palpable throughout   Lungs- chest clear, no wheezing, rales, normal symmetric air entry Abdomen- distended and tight Extremities- BLE edema Lymph-no adenopathy palpable Musculoskeletal-LE tenderness Skin-jaundice  Neurological Examination   Mental Status: Lethargic.  Requires light sternal rub to alert.  Follows simple commands.  Gives one-word answers that are appropriate.  Oriented to name and place.  Speech dysarthric.   Cranial Nerves: II: Blinks to bilateral confrontation, pupils equal, round, reactive to light and accommodation III,IV, VI: ptosis not present, extra-ocular motions intact bilaterally V,VII: smile symmetric, facial light touch sensation normal bilaterally VIII: hearing normal bilaterally IX,X: gag reflex present XI: bilateral shoulder shrug XII: midline tongue extension Motor: Lifts all extremities weakly against gravity with no focal weakness appreciated Sensory: Pinprick and light touch intact throughout, bilaterally Deep Tendon Reflexes: Symmetric throughout Plantars: Right: mute   Left: mute Cerebellar: Unable to perform due to lethargy Gait: Unable to perform due to lethargy    Laboratory Studies:   Basic Metabolic Panel: Recent Labs  Lab 05/04/20 0533 05/04/20 0533 05/05/20 0507 05/05/20 0507 05/06/20 [id number] 05/06/20 0318 05/07/20 0452 05/08/20 0740 05/10/20 0520  NA 126*   < > 123*  --  125*  --  124* 129* 132*  K 3.0*   < > 4.3  --  4.7  --  4.5 4.3 3.6  CL 92*   < > 95*  --  93*  --  94* 96* 98  CO2 20*   < > 19*  --  22  --  18* 20* 25  GLUCOSE 118*   < > 142*  --  125*  --  107* 100* 115*  BUN 37*   < > 19  --  15  --  13 13 9   CREATININE 1.55*   < > 0.96  --  0.93  --  0.70 0.79 0.54*  CALCIUM 7.2*   < > 7.1*   < > 7.6*   < > 7.6* 7.5*  7.8*  MG 2.6*  --  2.1  --   --   --  1.8 1.7 1.5*  PHOS 4.0  --  2.5  --   --   --  3.5 3.1 3.8   < > = values in this interval not displayed.    Liver Function Tests: Recent Labs  Lab 05/05/20 0507 05/06/20 0318 05/07/20 0452 05/08/20 0740 05/10/20 0520  AST 58* 43* 41 54* 63*  ALT 24 23 22 25 30   ALKPHOS 56 60 61 114 143*  BILITOT 1.7* 2.1* 2.1* 2.3* 1.0  PROT 4.8* 5.0* 4.7* 4.6* 4.4*  ALBUMIN 2.2* 2.0* 1.8* 1.8* 1.6*   Recent Labs  Lab 05/05/20 0507 05/06/20 0318 05/07/20 0452 05/08/20 0740 05/10/20 0520  LIPASE 370* 463* 365* 552* 825*   Recent Labs  Lab 05/09/20 1544  AMMONIA 24    CBC: Recent Labs  Lab 05/03/20 1942 05/03/20 1942 05/04/20 0533 05/05/20 0507 05/07/20 0452 05/08/20 0740 05/10/20 0520  WBC 11.9*   < > 10.1 9.7 14.3* 17.2* 18.6*  NEUTROABS 9.5*  --   --  7.3 10.2* 14.4* 15.3*  HGB 15.3   < > 13.0 11.9* 10.9* 11.0* 10.7*  HCT 42.5   < > 35.9* 33.6* 30.5* 31.4* 30.1*  MCV 95.3   < > 94.2 94.1 93.6 94.9 93.5  PLT 110*   < > 86* 108* 173 213 205   < > = values in this interval not displayed.    Cardiac Enzymes: Recent Labs  Lab 05/03/20 1942 05/04/20 0533  CKTOTAL 738* 1,440*    BNP: Invalid input(s): POCBNP  CBG: Recent Labs  Lab 05/09/20 1149 05/09/20 1650 05/09/20 2243 05/10/20 0751 05/10/20 1152  GLUCAP 129* 110* 129* 111* 122*    Microbiology: Results for orders placed or performed during the hospital encounter of 05/03/20  SARS Coronavirus 2 by RT PCR (hospital order, performed in [facility] hospital lab) Nasopharyngeal Nasopharyngeal Swab     Status: None   Collection Time: 05/03/20  8:25 PM   Specimen: Nasopharyngeal Swab  Result Value Ref Range Status   SARS Coronavirus 2 NEGATIVE NEGATIVE Final    Comment: (NOTE) SARS-CoV-2 target nucleic acids are NOT DETECTED.  The SARS-CoV-2 RNA is generally detectable in upper and lower respiratory specimens during the acute phase of infection. The  lowest concentration of SARS-CoV-2 viral copies this assay can detect is 250 copies / mL. A negative result does not preclude SARS-CoV-2 infection and should not be used as the sole basis for treatment or other patient management decisions.  A negative result may occur with improper specimen collection / handling, submission of specimen other than nasopharyngeal swab, presence of viral mutation(s) within the areas targeted by this assay, and inadequate number of viral  copies (<250 copies / mL). A negative result must be combined with clinical observations, patient history, and epidemiological information.  Fact Sheet for Patients:   BoilerBrush.com.cy  Fact Sheet for Healthcare Providers: https://pope.com/  This test is not yet approved or  cleared by the [place] FDA and has been authorized for detection and/or diagnosis of SARS-CoV-2 by FDA under an Emergency Use Authorization (EUA).  This EUA will remain in effect (meaning this test can be used) for the duration of the COVID-19 declaration under Section 564(b)(1) of the Act, 21 U.S.C. section 360bbb-3(b)(1), unless the authorization is terminated or revoked sooner.  Performed at [facility], [address] [place], [place] [address]   MRSA PCR Screening     Status: None   Collection Time: 05/03/20 11:57 PM   Specimen: Nasopharyngeal  Result Value Ref Range Status   MRSA by PCR NEGATIVE NEGATIVE Final    Comment:        The GeneXpert MRSA Assay (FDA approved for NASAL specimens only), is one component of a comprehensive MRSA colonization surveillance program. It is not intended to diagnose MRSA infection nor to guide or monitor treatment for MRSA infections. Performed at [facility], [address] [place], [place] [address]   Body fluid culture     Status: None (Preliminary result)   Collection Time: 05/07/20 12:00 PM   Specimen: PATH  Cytology Peritoneal fluid  Result Value Ref Range Status   Specimen Description   Final    PERITONEAL Performed at [facility], [address] [place], [place] [address]    Special Requests   Final    PERITONEAL Performed at [facility], [address] [place], [place] [address]    Gram Stain   Final    WBC PRESENT,BOTH PMN AND MONONUCLEAR NO ORGANISMS SEEN CYTOSPIN SMEAR    Culture   Final    NO GROWTH 3 DAYS Performed at [facility], [address], [place], [place] [address]    Report Status PENDING  Incomplete    Coagulation Studies: No results for input(s): LABPROT, INR in the last 72 hours.  Urinalysis: No results for input(s): COLORURINE, LABSPEC, PHURINE, GLUCOSEU, HGBUR, BILIRUBINUR, KETONESUR, PROTEINUR, UROBILINOGEN, NITRITE, LEUKOCYTESUR in the last 168 hours.  Invalid input(s): APPERANCEUR  Lipid Panel:  No results found for: CHOL, TRIG, HDL, CHOLHDL, VLDL, LDLCALC  HgbA1C: No results found for: HGBA1C  Urine Drug Screen:  No results found for: LABOPIA, COCAINSCRNUR, LABBENZ, AMPHETMU, THCU, LABBARB  Alcohol Level: No results for input(s): ETH in the last 168 hours.  Other results: EKG: sinus tachycardia at 112 bpm.  Imaging: US Abdomen Limited  Result Date: 05/10/2020 INDICATION: Ascites. EXAM: ULTRASOUND GUIDED PARACENTESIS MEDICATIONS: None. COMPLICATIONS: None immediate. PROCEDURE: See below. FINDINGS: Only a small amount of ascites noted. Paracentesis was not performed. Follow-up exam can be obtained. IMPRESSION: Paracentesis not performed due to small amount of fluid. Electronically Signed   By: Maisie Fus  Register   On: 05/10/2020 11:27     Assessment/Plan: 34 y.o. male with a history of alcohol abuse who is unable to provide history due to lethargy therefore all history obtained from the chart.  Patient with a long history of ETOH abuse.  Abruptly stopped drinking the day before presentation (6/24) and became  progressively confused.  Was brought to the ED by his wife and while in the ED was witnessed to have a seizure.  Patient has not had further seizures but has remained confused and lethargic.  Initial head CT personally reviewed and shows no acute changes.  B12, folate are normal.  Patient with multiple other medical issues that are likely contributing to a metabolic encephalopathy in addition to his withdrawal from ETOH.    Recommendations: 1. Agree with continued addressing of medical issues 2. EEG 3. MRI of the brain without contrast 4. TSH, thiamine level  Thana Farr, MD Neurology [phone] 05/10/2020, 2:40 PM

## 2020-05-11 DIAGNOSIS — E43 Unspecified severe protein-calorie malnutrition: Secondary | ICD-10-CM

## 2020-05-11 LAB — CBC WITH DIFFERENTIAL/PLATELET
Abs Immature Granulocytes: 1.05 10*3/uL — ABNORMAL HIGH (ref 0.00–0.07)
Basophils Absolute: 0 10*3/uL (ref 0.0–0.1)
Basophils Relative: 0 %
Eosinophils Absolute: 0.3 10*3/uL (ref 0.0–0.5)
Eosinophils Relative: 1 %
HCT: 31.2 % — ABNORMAL LOW (ref 39.0–52.0)
Hemoglobin: 11.1 g/dL — ABNORMAL LOW (ref 13.0–17.0)
Immature Granulocytes: 5 %
Lymphocytes Relative: 4 %
Lymphs Abs: 0.8 10*3/uL (ref 0.7–4.0)
MCH: 32.7 pg (ref 26.0–34.0)
MCHC: 35.6 g/dL (ref 30.0–36.0)
MCV: 92 fL (ref 80.0–100.0)
Monocytes Absolute: 1.4 10*3/uL — ABNORMAL HIGH (ref 0.1–1.0)
Monocytes Relative: 7 %
Neutro Abs: 15.9 10*3/uL — ABNORMAL HIGH (ref 1.7–7.7)
Neutrophils Relative %: 83 %
Platelets: 219 10*3/uL (ref 150–400)
RBC: 3.39 MIL/uL — ABNORMAL LOW (ref 4.22–5.81)
RDW: 12.2 % (ref 11.5–15.5)
Smear Review: NORMAL
WBC: 19.5 10*3/uL — ABNORMAL HIGH (ref 4.0–10.5)
nRBC: 0 % (ref 0.0–0.2)

## 2020-05-11 LAB — BODY FLUID CULTURE: Culture: NO GROWTH

## 2020-05-11 LAB — COMPREHENSIVE METABOLIC PANEL
ALT: 27 U/L (ref 0–44)
AST: 50 U/L — ABNORMAL HIGH (ref 15–41)
Albumin: 1.6 g/dL — ABNORMAL LOW (ref 3.5–5.0)
Alkaline Phosphatase: 134 U/L — ABNORMAL HIGH (ref 38–126)
Anion gap: 9 (ref 5–15)
BUN: 9 mg/dL (ref 6–20)
CO2: 30 mmol/L (ref 22–32)
Calcium: 7.8 mg/dL — ABNORMAL LOW (ref 8.9–10.3)
Chloride: 95 mmol/L — ABNORMAL LOW (ref 98–111)
Creatinine, Ser: 0.6 mg/dL — ABNORMAL LOW (ref 0.61–1.24)
GFR calc Af Amer: >60 mL/min (ref >60)
GFR calc non Af Amer: >60 mL/min (ref >60)
Glucose, Bld: 117 mg/dL — ABNORMAL HIGH (ref 70–99)
Potassium: 3.4 mmol/L — ABNORMAL LOW (ref 3.5–5.1)
Sodium: 134 mmol/L — ABNORMAL LOW (ref 135–145)
Total Bilirubin: 0.9 mg/dL (ref 0.3–1.2)
Total Protein: 4.6 g/dL — ABNORMAL LOW (ref 6.5–8.1)

## 2020-05-11 LAB — PHOSPHORUS: Phosphorus: 3.8 mg/dL (ref 2.5–4.6)

## 2020-05-11 LAB — GLUCOSE, CAPILLARY
Glucose-Capillary: 113 mg/dL — ABNORMAL HIGH (ref 70–99)
Glucose-Capillary: 110 mg/dL — ABNORMAL HIGH (ref 70–99)
Glucose-Capillary: 127 mg/dL — ABNORMAL HIGH (ref 70–99)
Glucose-Capillary: 110 mg/dL — ABNORMAL HIGH (ref 70–99)

## 2020-05-11 LAB — MAGNESIUM: Magnesium: 1.7 mg/dL (ref 1.7–2.4)

## 2020-05-11 MED ORDER — ACAMPROSATE CALCIUM 333 MG PO TBEC
666.0000 mg | DELAYED_RELEASE_TABLET | Freq: Three times a day (TID) | ORAL | Status: DC
  Administered 2020-05-11: 16:00:00 666 mg via ORAL
  Filled 2020-05-11 (×3): qty 2

## 2020-05-11 MED ORDER — FUROSEMIDE 20 MG PO TABS
40.0000 mg | ORAL_TABLET | Freq: Every day | ORAL | Status: DC
  Administered 2020-05-11 – 2020-05-15 (×6): 40 mg via ORAL
  Filled 2020-05-11 (×5): qty 1

## 2020-05-11 MED ORDER — ENSURE ENLIVE PO LIQD
237.0000 mL | Freq: Three times a day (TID) | ORAL | Status: DC
  Administered 2020-05-11 – 2020-05-15 (×11): 237 mL via ORAL

## 2020-05-11 NOTE — Progress Notes (Signed)
PROGRESS NOTE    Shane Kampa Sr.   WTU:[id number]  DOB: [birth year]  PCP: [name], MD    DOA: 05/03/2020 LOS: 8   Brief Narrative   34 year old gentleman with no medical history, chronic alcoholism, has been drinking nonstop about 12 packs of beer daily since age of 44 presented to the ED on  05/03/2020 for evaluation of progressive confusion and lethargy.  He also had episode of unresponsiveness in the triage area.  As per patient's fianc, patient stopped drinking about 24 hours prior to arrival and started behaving altered, confused.  At the same time, patient has multiple episodes of loose watery stool, some of them dark appearing.  No nausea or vomiting.  In the ED, he was ill-appearing, BP stable, tachycardic with heart rate 141, respirations 21, and afebrile.  Labs were notable for sodium 124, potassium was 2.8, creatinine 1.75, bilirubin 2.  Anion gap 19.  CK 700.  Chest x-ray was essentially normal.  CT head was normal.    CT abdomen/pelvis showed:  Edema adjacent to head and body of pancreas question pancreatitis (recommend correlation with serum lipase/amylase levels), cystic lesions at the pancreas, largest at head 4.4 cm diameter, could represent pseudocysts in a patient with pancreatitis or cystic pancreatic neoplasms; recommend attention on follow-up imaging to ensure resolution, or may characterize by MR, moderate ascites with mild fatty  infiltration of liver, small BILATERAL pleural effusions and minimal bibasilar atelectasis (other non-pertinent findings in report).   Admitted to the hospitalist service for further evaluation and management on acute alcohol withdrawal syndrome, acute pancreatitis and possible SBP.     Patient also with abdominal pain and diarrhea and worsening distention.  General surgery was consulted.  Intra-abdominal pressure monitoring < 20 and stable hemodynamics, no end organ damage.  Paracentesis on 6/28 yielded 2.8 L ascitic fluid with elevated  neutrophils.  Started on meropenem pending fluid cultures.       Assessment & Plan   Principal Problem:   Acute alcoholic pancreatitis Active Problems:   Acute metabolic encephalopathy   Severe protein-calorie malnutrition (HCC)   Sinus tachycardia   Alcohol withdrawal seizure with delirium (HCC)   Hyponatremia   Hypokalemia   AKI (acute kidney injury) (HCC)   Elevated CK   Heme positive stool   Thrombocytopenia (HCC)   Acute metabolic encephalopathy - improving.  Patient has been significantly lethargy since admission, more awake and interactive today (7/2).  He is no longer receiving benzos for alcohol withdrawal.  CT head upon admission was negative for acute findings that showed generalized atrophy advanced for his age.  Exam is non-focal.  Ammonia level normal.  B12 normal.  UDS on admission was never done, please collect and process.  Neurology consulted, appreciate input.  EEG and MRI brain, check TSH and thiamine levels.     Alcohol withdrawal syndrome - present on admission Alcohol dependence / Alcohol use disorder Completed Librium taper.  Continue CIWA protocol for monitoring, not requiring Ativan for several days, will d/c CIWA.  Continue thiamine and folic acid.  Patient agreeable to medication for AUD, to reduce cravings and assist in maintaining his sobriety.  Will start on naltrexone 50 mg PO daily (liver function okay and only mild fatty infiltration on imaging).  Recommend patient pursue psychosocial support in community after discharge.  Acute alcoholic pancreatitis -present on admission with abdominal pain, tachycardia,  diarrhea, hypotension.  No gallbladder abnormalities on CT abdomen.  Continue supportive care with IV hydration, pain control.  Advance diet  to soft, further advance as tolerated. Has been off meropenem given negative ascites fluid cultures have been negative.  Monitor abdominal exam.   With worsening abdominal distention, surgery was consulted.   Intra-abdominal pressure monitoring showed intra-abdominal pressure less than 20 with no hemodynamic instability.  No endorgan damage.  Surgery recommends management of ascites and pancreatitis.  Severe Protein Calorie Malnutrition - POA, due to chronic alcohol abuse and not eating regular food.  Now day 6 of admission with minimal PO intake.  Dietician consulted.  May require tube feeding soon, recommendations have been made, but will try patient on diet first.  Daily CMP, Mg, Phos.  High risk of refeeding syndrome.  Monitor phos and other lytes daily.  Leukocytosis - due to pancreatitis, no other evidence of infection. Sinus tachycardia - persistent, likely due to alcohol withdrawal and pain from pancreatitis.  No evidence of infection / sepsis.  Ascites fluid cultures have been negative.  Started low dose of Coreg for BP and HR control.  Continue to manage underlying issues as above.  Will check blood cultures.  No respiratory or urinary symptoms, no fevers to suggest infection.  Ascites - s/p paracentesis on 6/28 with 2.8 L fluid removed. Abdomen again distended, ordered repeat paracentesis (7/1), but not enough fluid seen on ultrasound to do safely.  Continue Lasix and spironolactone.    Chest pain - reported by pt on AM of 7/1.  Troponin mildly elevated at 32, likely demand ischemia from persistent tachycardia in setting of alcohol withdrawal.  No acute ischemic changes on EKG, only non-specific T wave changes.  Monitor.  Hypervolemic hyponatremia -POA with sodium 124.   Suspect due to liver disease.  Monitor BMP.  Improving with diuretics.  Acute renal failure -present on admission with creatinine 1.75.  Likely prerenal azotemia in the setting of above.  Improved with IV hydration.  Monitor BMP and urine output.  Hypokalemia -present on admission.  Replaced with improvement.    Monitor BMP, mag and Foss    DVT prophylaxis: SCDs Start: 05/03/20 2153   Diet:  Diet Orders (From  admission, onward)    Start     Ordered   05/10/20 1446  Diet full liquid Room service appropriate? Yes; Fluid consistency: Thin  Diet effective now       Question Answer Comment  Room service appropriate? Yes   Fluid consistency: Thin      05/10/20 1445            Code Status: Full Code    Subjective 05/11/20    Patient seen at bedside with significant other present.  No acute events reported.  He is more awake and interactive today, able to carry on conversation but still somewhat withdrawn.  He denies any pain including abdominal pain, nausea or vomiting, fevers or chills.  Says he wants to try to eat food.  Asked if he is interested in medication to help him stay sober and reduce alcohol cravings, he is agreeable.   Disposition Plan & Communication   Status is: Inpatient  Remains inpatient appropriate because:Inpatient level of care appropriate due to severity of illness.  Encephalopathy and abdominal pain are improved, and will assess his diet tolerance today.  He is very high risk for refeeding syndrome and requires close monitoring in hospital for this.   Dispo: The patient is from: Home              Anticipated d/c is to: Home  Anticipated d/c date is: 2-3 days              Patient currently is not medically stable to d/c.   Family Communication: family were at bedside during encounter. Discussed current clinical status and plans in detail, all questions answered.   Consults, Procedures, Significant Events   Consultants:   None  Procedures:   Paracentesis 6/28  Antimicrobials:   Meropenem (6/28 to 7/1)  Objective   Vitals:   05/10/20 1643 05/10/20 2022 05/11/20 0154 05/11/20 0518  BP: (!) 125/95 (!) 140/105 123/89 (!) 129/91  Pulse: (!) 108 (!) 114 (!) 105 (!) 108  Resp: 16 16 16 16   Temp: 98.2 F (36.8 C) (!) 97.5 F (36.4 C) 98.7 F (37.1 C) 98.4 F (36.9 C)  TempSrc:  Oral Oral Oral  SpO2: 98% 98% 98% 96%  Weight:      Height:         Intake/Output Summary (Last 24 hours) at 05/11/2020 0747 Last data filed at 05/11/2020 0521 Gross per 24 hour  Intake --  Output 1450 ml  Net -1450 ml   Filed Weights   05/03/20 2232  Weight: 63.5 kg    Physical Exam:  General exam: awake, alert, no acute distress, underweight, chronically ill appearing HEENT: Moist mucus membranes, hearing grossly normal, pale lips  Respiratory system: CTAB, normal respiratory effort, no wheezes or rhonchi. Cardiovascular system: normal S1/S2, RRR, trace to 1+ lower extremity edema.   Gastrointestinal system: distended but not tender today, positive bowel sounds. Central nervous system: A&Ox3. no gross focal neurologic deficits, normal speech Extremities: moves all, no myosis, normal tone Skin: dry, intact, normal temperature, pale  Labs   Data Reviewed: I have personally reviewed following labs and imaging studies  CBC: Recent Labs  Lab 05/05/20 0507 05/07/20 0452 05/08/20 0740 05/10/20 0520 05/11/20 0603  WBC 9.7 14.3* 17.2* 18.6* 19.5*  NEUTROABS 7.3 10.2* 14.4* 15.3* 15.9*  HGB 11.9* 10.9* 11.0* 10.7* 11.1*  HCT 33.6* 30.5* 31.4* 30.1* 31.2*  MCV 94.1 93.6 94.9 93.5 92.0  PLT 108* 173 213 205 219   Basic Metabolic Panel: Recent Labs  Lab 05/05/20 0507 05/05/20 0507 05/06/20 0318 05/07/20 0452 05/08/20 0740 05/10/20 0520 05/11/20 0603  NA 123*   < > 125* 124* 129* 132* 134*  K 4.3   < > 4.7 4.5 4.3 3.6 3.4*  CL 95*   < > 93* 94* 96* 98 95*  CO2 19*   < > 22 18* 20* 25 30  GLUCOSE 142*   < > 125* 107* 100* 115* 117*  BUN 19   < > 15 13 13 9 9   CREATININE 0.96   < > 0.93 0.70 0.79 0.54* 0.60*  CALCIUM 7.1*   < > 7.6* 7.6* 7.5* 7.8* 7.8*  MG 2.1  --   --  1.8 1.7 1.5* 1.7  PHOS 2.5  --   --  3.5 3.1 3.8 3.8   < > = values in this interval not displayed.   GFR: Estimated Creatinine Clearance: 116.9 mL/min (A) (by C-G formula based on SCr of 0.6 mg/dL (L)). Liver Function Tests: Recent Labs  Lab 05/06/20 0318  05/07/20 0452 05/08/20 0740 05/10/20 0520 05/11/20 0603  AST 43* 41 54* 63* 50*  ALT 23 22 25 30 27   ALKPHOS 60 61 114 143* 134*  BILITOT 2.1* 2.1* 2.3* 1.0 0.9  PROT 5.0* 4.7* 4.6* 4.4* 4.6*  ALBUMIN 2.0* 1.8* 1.8* 1.6* 1.6*   Recent Labs  Lab  05/05/20 0507 05/06/20 0318 05/07/20 0452 05/08/20 0740 05/10/20 0520  LIPASE 370* 463* 365* 552* 825*   Recent Labs  Lab 05/09/20 1544  AMMONIA 24   Coagulation Profile: No results for input(s): INR, PROTIME in the last 168 hours. Cardiac Enzymes: No results for input(s): CKTOTAL, CKMB, CKMBINDEX, TROPONINI in the last 168 hours. BNP (last 3 results) No results for input(s): PROBNP in the last 8760 hours. HbA1C: No results for input(s): HGBA1C in the last 72 hours. CBG: Recent Labs  Lab 05/09/20 2243 05/10/20 0751 05/10/20 1152 05/10/20 1638 05/10/20 2205  GLUCAP 129* 111* 122* 115* 131*   Lipid Profile: No results for input(s): CHOL, HDL, LDLCALC, TRIG, CHOLHDL, LDLDIRECT in the last 72 hours. Thyroid Function Tests: No results for input(s): TSH, T4TOTAL, FREET4, T3FREE, THYROIDAB in the last 72 hours. Anemia Panel: No results for input(s): VITAMINB12, FOLATE, FERRITIN, TIBC, IRON, RETICCTPCT in the last 72 hours. Sepsis Labs: No results for input(s): PROCALCITON, LATICACIDVEN in the last 168 hours.  Recent Results (from the past 240 hour(s))  SARS Coronavirus 2 by RT PCR (hospital order, performed in [facility] hospital lab) Nasopharyngeal Nasopharyngeal Swab     Status: None   Collection Time: 05/03/20  8:25 PM   Specimen: Nasopharyngeal Swab  Result Value Ref Range Status   SARS Coronavirus 2 NEGATIVE NEGATIVE Final    Comment: (NOTE) SARS-CoV-2 target nucleic acids are NOT DETECTED.  The SARS-CoV-2 RNA is generally detectable in upper and lower respiratory specimens during the acute phase of infection. The lowest concentration of SARS-CoV-2 viral copies this assay can detect is 250 copies / mL. A negative  result does not preclude SARS-CoV-2 infection and should not be used as the sole basis for treatment or other patient management decisions.  A negative result may occur with improper specimen collection / handling, submission of specimen other than nasopharyngeal swab, presence of viral mutation(s) within the areas targeted by this assay, and inadequate number of viral copies (<250 copies / mL). A negative result must be combined with clinical observations, patient history, and epidemiological information.  Fact Sheet for Patients:   BoilerBrush.com.cy  Fact Sheet for Healthcare Providers: https://pope.com/  This test is not yet approved or  cleared by the [place] FDA and has been authorized for detection and/or diagnosis of SARS-CoV-2 by FDA under an Emergency Use Authorization (EUA).  This EUA will remain in effect (meaning this test can be used) for the duration of the COVID-19 declaration under Section 564(b)(1) of the Act, 21 U.S.C. section 360bbb-3(b)(1), unless the authorization is terminated or revoked sooner.  Performed at [facility], [address] [place], [place] [address]   MRSA PCR Screening     Status: None   Collection Time: 05/03/20 11:57 PM   Specimen: Nasopharyngeal  Result Value Ref Range Status   MRSA by PCR NEGATIVE NEGATIVE Final    Comment:        The GeneXpert MRSA Assay (FDA approved for NASAL specimens only), is one component of a comprehensive MRSA colonization surveillance program. It is not intended to diagnose MRSA infection nor to guide or monitor treatment for MRSA infections. Performed at [facility], [address] [place], [place] [address]   Body fluid culture     Status: None (Preliminary result)   Collection Time: 05/07/20 12:00 PM   Specimen: PATH Cytology Peritoneal fluid  Result Value Ref Range Status   Specimen Description   Final     PERITONEAL Performed at [facility]  Lab, [address], [place], [place] [address]    Special Requests   Final    PERITONEAL Performed at [facility], [address] [place], [place] [address]    Gram Stain   Final    WBC PRESENT,BOTH PMN AND MONONUCLEAR NO ORGANISMS SEEN CYTOSPIN SMEAR    Culture   Final    NO GROWTH 3 DAYS Performed at [facility], [address] [place], [place] [address]    Report Status PENDING  Incomplete      Imaging Studies   US Abdomen Limited  Result Date: 05/10/2020 INDICATION: Ascites. EXAM: ULTRASOUND GUIDED PARACENTESIS MEDICATIONS: None. COMPLICATIONS: None immediate. PROCEDURE: See below. FINDINGS: Only a small amount of ascites noted. Paracentesis was not performed. Follow-up exam can be obtained. IMPRESSION: Paracentesis not performed due to small amount of fluid. Electronically Signed   By: Maisie Fus  Register   On: 05/10/2020 11:27     Medications   Scheduled Meds: . carvedilol  6.25 mg Oral BID WC  . Chlorhexidine Gluconate Cloth  6 each Topical Daily  . folic acid  1 mg Oral Daily  . multivitamin with minerals  1 tablet Oral Daily  . pantoprazole (PROTONIX) IV  40 mg Intravenous Q12H  . sodium chloride flush  3 mL Intravenous Q12H  . spironolactone  25 mg Oral Daily  . thiamine  100 mg Oral Daily   Or  . thiamine  100 mg Intravenous Daily   Continuous Infusions:      LOS: 8 days    Time spent: 30 minutes    Pennie Banter, DO [facility]  05/11/2020, 7:47 AM    If 7PM-7AM, please contact night-coverage. How to contact the [facility] Attending or Consulting provider 7A - 7P or covering provider during after hours 7P -7A, for this patient?    1. Check the care team in [facility] and look for a) attending/consulting [facility] provider listed and b) the [facility] team listed 2. Log into [email or web address] and use [place]'s universal password to access. If you do not have the password, please contact the hospital  operator. 3. Locate the [facility] provider you are looking for under [facility] and page to a number that you can be directly reached. 4. If you still have difficulty reaching the provider, please page the [facility] (Director on Call) for the Hospitalists listed on amion for assistance.

## 2020-05-11 NOTE — Evaluation (Signed)
Occupational Therapy Evaluation Patient Details Name: Shane Platte Sr. MRN: [id number] DOB: 04-04-1986 Today's Date: 05/11/2020    History of Present Illness Pt is a 34 y/o male who has a long history of alcohol abuse who presented to the ED (6/24) in a state of withdrawal and was witnessed to have a seizure; no seizure activity reported since. He was found to have acute alcoholic pancreatitis. Pt has been drinking since he was 18 and per chart, drinks two 12-packs a day.   Clinical Impression   Pt is much more awake than yesterday, alert and oriented x4. He is aware of his situation, saying "I did this to myself." He is pleasant throughout, with increased time required for processing and responding to questions and commands. He lives at home with his wife, 9 y/o son and best friend. 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 Prior to hospitalization, he was independent in ADL/IADL without AD, though he says he does not drive lately. He reports 5-6 falls in the past year, related to drinking. During session, he demonstrated difficulties with bed mobility, requiring Min A and increased time/effort; HR increased to 124bpm. He sat EOB x5 min, requiring Min A to maintain balance and fatiguing quickly. He was unsafe to attempt stand this date and grossly requires Min-Mod A for bed level UB.LB ADL. Pt presents with overall general weakness, decreased activity tolerance and ADL performance significantly lower than baseline. He will benefit from acute OT services to address these deficits and to identify alterative meaningful activities to substance use in order to maximize pt return to PLOF and overall quality of life. Pt also expressed interest in information on substance abuse support groups. Recommend SNF upon discharge.       Follow Up Recommendations  SNF    Equipment Recommendations  None recommended by OT    Recommendations for Other Services       Precautions /  Restrictions Precautions Precautions: Fall Restrictions Weight Bearing Restrictions: No Other Position/Activity Restrictions: pt had a seizure in the ED      Mobility Bed Mobility Overal bed mobility: Needs Assistance Bed Mobility: Supine to Sit;Sit to Supine     Supine to sit: Min assist;HOB elevated Sit to supine: Min assist   General bed mobility comments: increased time and difficulty with sitting up to EOB (HR increased to124) and repositioning at bed level, required Min A and verbal cues for shifting towards [facility]  Transfers                 General transfer comment: pt unsafe to attempt transfer this date 2/2 fatigue and weakness    Balance Overall balance assessment: Needs assistance;History of Falls Sitting-balance support: Bilateral upper extremity supported;Feet supported Sitting balance-Leahy Scale: Poor Sitting balance - Comments: pt requires BUE support and/or Min A for maintaining static sitting balance, fatigued quickly Postural control: Posterior lean;Right lateral lean                                 ADL either performed or assessed with clinical judgement   ADL Overall ADL's : Needs assistance/impaired Eating/Feeding: Bed level;Minimal assistance Eating/Feeding Details (indicate cue type and reason): required assist for setup of breakfast tray and repositioning for optimal safety during eating  General ADL Comments: Grossly requires Min-Mod A for bed level UB/LB ADL. Unable to safely stand up from EOB this date 2/2 fatigue and general weakness.     Vision Baseline Vision/History: No visual deficits Additional Comments: Pt denies blurry vision, but says he sees "the screen shaking." No shaking screen noted, pt says "I must be seeing things." Initially, pt has difficulties with visual tracking while seated EOB. Improved conjugate tracking at bed level.     Perception     Praxis       Pertinent Vitals/Pain Pain Assessment: No/denies pain     Hand Dominance     Extremity/Trunk Assessment Upper Extremity Assessment Upper Extremity Assessment: RUE deficits/detail;LUE deficits/detail;Generalized weakness RUE Deficits / Details: unable to complete full AROM (~120*), completes full PROM LUE Deficits / Details: unable to complete full AROM (~120*), completes full PROM   Lower Extremity Assessment Lower Extremity Assessment: Defer to PT evaluation;Generalized weakness       Communication Communication Communication: No difficulties   Cognition Arousal/Alertness: Awake/alert Behavior During Therapy: Flat affect;WFL for tasks assessed/performed Overall Cognitive Status: Within Functional Limits for tasks assessed                                 General Comments: Pt was A&O x4 and is aware of his situation, saying "I did this to myself." His affect is flat throughout and he requires increased time for processing and response to questions. Follows commands with increased time.   General Comments       Exercises Other Exercises Other Exercises: Pt educated re: the role of OT in recovery, effects of alcohol on functional participation and optimal positioning for safety during feeding. Other Exercises: Provided therapeutic listening and encouragement to pt during conversation about alcoholism and impact on pt's life. Pt expressed some interest in a support group. Will follow up with resources.   Shoulder Instructions      Home Living Family/patient expects to be discharged to:: Private residence Living Arrangements: Spouse/significant other;Children;Non-relatives/Friends (wife, son and best friend)   Type of Home: House Home Access: Stairs to enter Secretary/administrator of Steps: unclear Entrance Stairs-Rails: Right Home Layout: Two level     Bathroom Shower/Tub: Walk-in shower                    Prior Functioning/Environment Level of  Independence: Independent        Comments: pt reports he was independent in ADL/IADL without AD. 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 Pt does not currently work. Per chart, pt drinks two 12-packs a day. He says, "I did this to myself" and acknowledges that he enjoys drinking but does not enjoy the consequences. He reports having participated in a rehab group before, but did not like it because it was "too many people." He has had 5-6 falls in the past year, reports they occurred while he was drinking.        OT Problem List: Decreased strength;Decreased range of motion;Decreased activity tolerance;Impaired balance (sitting and/or standing)      OT Treatment/Interventions: Self-care/ADL training;Therapeutic exercise;Therapeutic activities;Patient/family education;Balance training    OT Goals(Current goals can be found in the care plan section) Acute Rehab OT Goals Patient Stated Goal: To get better and go home OT Goal Formulation: With patient Time For Goal Achievement: 05/25/20 Potential to Achieve Goals: Good ADL Goals Pt  Will Perform Grooming: with set-up;sitting (maintaining steady sitting balance x10 min) Pt Will Perform Upper Body Dressing: with set-up;sitting (with one or less verbal cues) Pt Will Transfer to Toilet: ambulating;bedside commode;with min assist Additional ADL Goal #1: Pt will participate in identifying >2 strategies for engaging in meaningful activity alternative to drinking to maximize pt quality of life and safety  OT Frequency: Min 2X/week   Barriers to D/C:            Co-evaluation              AM-PAC OT "6 Clicks" Daily Activity     Outcome Measure Help from another person eating meals?: A Little Help from another person taking care of personal grooming?: A Lot Help from another person toileting, which includes using toliet, bedpan, or urinal?: A Lot Help from another person  bathing (including washing, rinsing, drying)?: A Lot Help from another person to put on and taking off regular upper body clothing?: A Lot Help from another person to put on and taking off regular lower body clothing?: A Lot 6 Click Score: 13   End of Session Nurse Communication: Mobility status  Activity Tolerance: Patient limited by fatigue;Patient limited by lethargy Patient left: in bed;with call bell/phone within reach;with bed alarm set (seizure pads and bed rails in place)  OT Visit Diagnosis: Unsteadiness on feet (R26.81);Other abnormalities of gait and mobility (R26.89);History of falling (Z91.81);Muscle weakness (generalized) (M62.81)                Time: 8101-7510 OT Time Calculation (min): 36 min Charges:  OT General Charges $OT Visit: 1 Visit OT Evaluation $OT Eval Moderate Complexity: 1 Mod OT Treatments $Self Care/Home Management : 8-22 mins $Therapeutic Activity: 8-22 mins  Maurilio Lovely, OTS 05/11/20, 10:41 AM

## 2020-05-11 NOTE — Progress Notes (Signed)
SURGICAL PROGRESS NOTE   Hospital Day(s): 8.   Post op day(s):  Marland Kitchen   Interval History: Patient seen and examined, no acute events or new complaints overnight. Patient reports feeling "OK". Today alert and cooperative. Patient was able to say that he ate his breakfast without pain, nausea or vomiting. Significant other at bedside.   As per nurse, patient just had a bowel movement.   Vital signs in last 24 hours: [min-max] current  Temp:  [97.5 F (36.4 C)-98.7 F (37.1 C)] 97.8 F (36.6 C) (07/02 0816) Pulse Rate:  [99-114] 99 (07/02 1103) Resp:  [16-17] 17 (07/02 1103) BP: (123-140)/(89-105) 131/92 (07/02 1103) SpO2:  [96 %-98 %] 98 % (07/02 0816)     Height: 6\' 1"  (185.4 cm) Weight: 63.5 kg BMI (Calculated): 18.47   Physical Exam:  Constitutional: alert, cooperative and no distress  Respiratory: breathing non-labored at rest  Cardiovascular: regular rate and sinus rhythm  Gastrointestinal: soft, non-tender, and non-distended  Labs:  CBC Latest Ref Rng & Units 05/11/2020 05/10/2020 05/08/2020  WBC 4.0 - 10.5 K/uL 19.5(H) 18.6(H) 17.2(H)  Hemoglobin 13.0 - 17.0 g/dL 11.1(L) 10.7(L) 11.0(L)  Hematocrit 39 - 52 % 31.2(L) 30.1(L) 31.4(L)  Platelets 150 - 400 K/uL 219 205 213   CMP Latest Ref Rng & Units 05/11/2020 05/10/2020 05/08/2020  Glucose 70 - 99 mg/dL 05/10/2020) 546(E) 703(J)  BUN 6 - 20 mg/dL 9 9 13   Creatinine 0.61 - 1.24 mg/dL 009(F) ) 8.18(E  Sodium 135 - 145 mmol/L 134(L) 132(L) 129(L)  Potassium 3.5 - 5.1 mmol/L 3.4(L) 3.6 4.3  Chloride 98 - 111 mmol/L 95(L) 98 96(L)  CO2 22 - 32 mmol/L 30 25 20(L)  Calcium 8.9 - 10.3 mg/dL 7.8(L) 7.8(L) 7.5(L)  Total Protein 6.5 - 8.1 g/dL 4.6(L) 4.4(L) 4.6(L)  Total Bilirubin 0.3 - 1.2 mg/dL 0.9 1.0 9.93(Z)  Alkaline Phos 38 - 126 U/L 134(H) 143(H) 114  AST 15 - 41 U/L 50(H) 63(H) 54(H)  ALT 0 - 44 U/L 27 30 25     Imaging studies: No new pertinent imaging studies  Assessment/Plan:  34 y.o.malewith acute over chronic  pancreatitis with pseudocyst, complicated by pertinent comorbidities includingalcohol withdrawal.  Patient consulted due to suspected abdominal compartment syndrome. Today patient more alert, able to have a conversation. Patient tolerating diet. Agree with soft diet. Patient having bowel movements. Today abdomen is a little bit softer. No tenderness. There is no organ dysfunction. No surgical management indicated. Continue medical management of ascites and comorbidities.   1.69, MD

## 2020-05-11 NOTE — Progress Notes (Signed)
Initial Nutrition Assessment  DOCUMENTATION CODES:   Severe malnutrition in context of social or environmental circumstances  INTERVENTION:  Provide Ensure Enlive po TID, each supplement provides 350 kcal and 20 grams of protein.  Continue MVI daily, thiamine 725 mg daily, folic acid 1 mg daily.  Diet was advanced to soft and patient told MD he is ready to eat. Plan is to hold off on placement of small-bore feeding tube at this time. Recommend placement of small-bore feeding tube (10 Fr. Dobbhoff) for initiation of tube feeds if PO intake is unable to improve. If plan is for tube feeds:  -Initiate Osmolite 1.5 Cal at 15 mL/hr and advance by 20 mL/hr every 12 hours to goal rate of 55 mL/hr per tube -Provide Pro-Stat 30 mL once daily per tube -Goal regimen provides 2080 kcal, 98 grams of protein, 1003 mL H2O daily -Minimum free water flush of 30 mL Q4hrs to maintain tube patency  Monitor magnesium, potassium, and phosphorus daily for at least 3 days, MD to replete as needed, as pt is at risk for refeeding syndrome given severe malnutrition, EtOH abuse.  NUTRITION DIAGNOSIS:   Severe Malnutrition related to social / environmental circumstances (EtOH abuse) as evidenced by severe fat depletion, severe muscle depletion.  GOAL:   Patient will meet greater than or equal to 90% of their needs  MONITOR:   PO intake, Supplement acceptance, Diet advancement, Labs, Weight trends, TF tolerance, I & O's  REASON FOR ASSESSMENT:   Consult Assessment of nutrition requirement/status, Diet education, Other (Comment) (enteral/tube feeding assessment and recommendations)  ASSESSMENT:   34 year old male with PMHx of EtOH abuse admitted with alcohol withdrawal, acute alcoholic pancreatitis, acute metabolic encephalopathy, ascites s/p paracentesis on 6/28 with 2.8 L fluid removed, chest pain, acute renal failure.   -Diet was advanced to regular on 6/25 at 0947 but then changed to NPO at 1434 the same  day. -Diet was advanced to clear liquids on 6/29. -Diet was advanced to full liquids on 7/1.  Met with patient at bedside. No family members were present at time of RD assessment. Patient was alert in bed. He reports his appetite has been poor for a while now. He reports he does not eat much at home but is unable to provide any further details. He reports he has been taking sips of liquids. He is amenable to trying oral nutrition supplements to help meet calorie/protein needs.   Attempted to call patient's significant other several times but at each attempt the line was busy so could not leave a voicemail to request call back. Per review of chart significant other had reported that patient drinks two 12 packs of beer daily since the age of 52. She reported he does not eat much and only intake recently is alcohol.  Patient reports he is unsure of his UBW or if he has lost weight recently. He reports he has always been "small." No weight history available in chart. Patient is currently documented to be 63.5 kg (140 lbs).  Medications reviewed and include: folic acid 1 mg daily, MVI daily, Protonix, spironolactone, thiamine 100 mg daily.  Labs reviewed: CBG 113-131, Sodium 134, Potassium 3.4, Chloride 95, Creatinine 0.6.  Discussed with RN. Patient has not been taking in many of the liquids. Also discussed with MD. Patient told MD he is ready to eat. Diet was advanced to soft. Plan is to hold off on placement of small-bore feeding tube at this time.  NUTRITION - FOCUSED PHYSICAL EXAM:  Most Recent Value  Orbital Region Severe depletion  Upper Arm Region Severe depletion  Thoracic and Lumbar Region Moderate depletion  Buccal Region Severe depletion  Temple Region Severe depletion  Clavicle Bone Region Severe depletion  Clavicle and Acromion Bone Region Severe depletion  Scapular Bone Region Moderate depletion  Dorsal Hand Severe depletion  Patellar Region Severe depletion  Anterior Thigh  Region Severe depletion  Posterior Calf Region Severe depletion  Edema (RD Assessment) Mild  Hair Reviewed  Eyes Reviewed  Mouth Reviewed  Skin Reviewed  [ecchymosis]  Nails Reviewed     Diet Order:   Diet Order            DIET SOFT Room service appropriate? Yes; Fluid consistency: Thin  Diet effective now                EDUCATION NEEDS:   No education needs have been identified at this time  Skin:  Skin Assessment: Reviewed RN Assessment (ecchymosis)  Last BM:  05/11/2020 - medium type 7  Height:   Ht Readings from Last 1 Encounters:  05/03/20 '6\' 1"'  (1.854 m)   Weight:   Wt Readings from Last 1 Encounters:  05/03/20 63.5 kg   Ideal Body Weight:  83.6 kg  BMI:  Body mass index is 18.47 kg/m.  Estimated Nutritional Needs:   Kcal:  1900-2200  Protein:  95-105 grams  Fluid:  1.9-2.2 L/day  Jacklynn Barnacle, MS, RD, LDN Pager number available on Amion

## 2020-05-11 NOTE — Progress Notes (Addendum)
1457 Pt has had 6 loose stools since 7am. Notified MD Denton Lank.  1535 notified MD pt and another loose watery stool. Pt is becoming more confused, and there are no Ciwa order parameters ordered with the Ciwa or medications .

## 2020-05-11 NOTE — Progress Notes (Signed)
PT Cancellation Note  Patient Details Name: Shane Orlick Sr. MRN: [id number] DOB: January 05, 1986   Cancelled Treatment:    Reason Eval/Treat Not Completed: Patient's level of consciousness Earlier this AM pt was awake enough to participate with OT, attempted to see him X 2 later this AM and each time he was using the bed pan.  Pt eyes were open as PT entered the room however he quickly descended into a lethargic-pulling covers over his head-mumbling with cues to try and do anything state.  Wife present and ineffective in encouraging him to participate.  PT with multiple cues, angles and explanations for reason to get up and do something, all seemingly falling on deaf ears.  No response (positive or negative) when PT states he will try yet again to see him this afternoon.  Nursing made aware of "sudden change" in mental status.   Malachi Pro, DPT 05/11/2020, 11:44 AM

## 2020-05-11 NOTE — Evaluation (Signed)
Physical Therapy Evaluation Patient Details Name: Shane Crum Sr. MRN: [id number] DOB: 03-11-1986 Today's Date: 05/11/2020   History of Present Illness  33 y/o male who has a long history of alcohol abuse who presented to the ED (6/24) in a state of withdrawal and was witnessed to have a seizure; no seizure activity reported since. He was found to have acute alcoholic pancreatitis. Pt has been drinking since he was 18 and per chart, drinks two 12-packs a day.  Clinical Impression  Multiple attempts to see pt this date, was ultimately awake and clean enough to try and go PT exam this afternoon. He is profoundly weak (especially considering his age) and needed assist with bed mobility and more limitedly he very much struggled to get to standing, could not keep knees extended or hips forward and ultimately needed heavy assist just to maintain off of bed standing with heavy walker reliance (poor awareness/use of it).  Pt, understandably, does not wish to go to rehab but understands that he is too weak and unsafe to be able to manage at home.       Follow Up Recommendations SNF;Supervision/Assistance - 24 hour    Equipment Recommendations   (if he goes home he'll need at least RW, TBD per progress)    Recommendations for Other Services       Precautions / Restrictions Precautions Precautions: Fall Restrictions Weight Bearing Restrictions: Yes      Mobility  Bed Mobility Overal bed mobility: Needs Assistance Bed Mobility: Supine to Sit;Sit to Supine     Supine to sit: Min assist Sit to supine: Mod assist   General bed mobility comments: Pt slowly but w/ only light assist was able to get to sitting EOB, pt unable to organize enough to get himself back into bed despite much cuing, considerable assist to initiate and complete getting back into supine  Transfers Overall transfer level: Needs assistance Equipment used: Rolling walker (2 wheeled) Transfers: Sit to/from Stand Sit to Stand:  Mod assist;Max assist         General transfer comment: Multiple standing bouts/attempts from elevated surface, heavy cuing for appropriate UE use, cues for sequencing and posture and plenty of encouragement. Pt unable to attain full upright on any attempt with knees staying bent much of the time and inability to shift hips forward enough to stand tall.  Pt reliant on the walker, unable to take even a single side/shuffle step and could not tolerate more than ~30 seconds of standing each time before needing to sit  Ambulation/Gait             General Gait Details: unable/unsafe at this time  Stairs            Wheelchair Mobility    Modified Rankin (Stroke Patients Only)       Balance Overall balance assessment: Needs assistance;History of Falls Sitting-balance support: Bilateral upper extremity supported;Feet supported Sitting balance-Leahy Scale: Poor Sitting balance - Comments: Pt needing close supervision at all times, does regularly lose balance (generally to the R)      Standing balance-Leahy Scale: Zero Standing balance comment: Pt struggled to shift weight appropriately through UEs/walker to maintain standing; showed good effort but simply did not have the strength, endurance or will to really attain/maintain standing w/o direct and largely very heavy assist                             Pertinent Vitals/Pain Pain Assessment:  (  general pain but nothing focal)    Home Living Family/patient expects to be discharged to:: Private residence Living Arrangements: Spouse/significant other;Children;Non-relatives/Friends Available Help at Discharge: Family (wife could be home 24/7 if needed) Type of Home: House Home Access: Stairs to enter Entrance Stairs-Rails: Right Entrance Stairs-Number of Steps: 3          Prior Function Level of Independence: Independent         Comments: independent in ADL/IADL without AD, though wife reports that recently he  is always leaning on walls/funiture/etc. 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 Pt does not currently work. Per chart, pt drinks two 12-packs a day. Apparently he participated in a rehab group before, but did not like it because it was "too many people." He has had 5-6 falls in the past year, reports they occurred while he was drinking.     Hand Dominance        Extremity/Trunk Assessment   Upper Extremity Assessment Upper Extremity Assessment: Generalized weakness;Defer to OT evaluation    Lower Extremity Assessment Lower Extremity Assessment: Generalized weakness (weak w/ any against gravity/resistance, grossly 3-/5 t/o )       Communication   Communication:  (pt minimally verbal)  Cognition Arousal/Alertness: Lethargic Behavior During Therapy: Flat affect;WFL for tasks assessed/performed Overall Cognitive Status: Within Functional Limits for tasks assessed                                 General Comments: Pt was A&O x4 and is aware of his situation, saying "I did this to myself." His affect is flat throughout and he requires increased time for processing and response to questions. Follows commands with increased time.      General Comments      Exercises General Exercises - Lower Extremity Ankle Circles/Pumps: AROM;5 reps Heel Slides: Strengthening;5 reps Hip ABduction/ADduction: Strengthening;5 reps   Assessment/Plan    PT Assessment Patient needs continued PT services  PT Problem List Decreased strength;Decreased range of motion;Decreased activity tolerance;Decreased balance;Decreased mobility;Decreased coordination;Decreased cognition;Decreased knowledge of use of DME;Decreased safety awareness       PT Treatment Interventions DME instruction;Gait training;Stair training;Therapeutic activities;Functional mobility training;Therapeutic exercise;Balance training;Neuromuscular  re-education;Patient/family education;Cognitive remediation    PT Goals (Current goals can be found in the Care Plan section)  Acute Rehab PT Goals Patient Stated Goal: To get better and go home PT Goal Formulation: With patient/family Time For Goal Achievement: 05/25/20 Potential to Achieve Goals: Fair    Frequency Min 2X/week   Barriers to discharge        Co-evaluation               AM-PAC PT "6 Clicks" Mobility  Outcome Measure Help needed turning from your back to your side while in a flat bed without using bedrails?: A Little Help needed moving from lying on your back to sitting on the side of a flat bed without using bedrails?: A Lot Help needed moving to and from a bed to a chair (including a wheelchair)?: Total Help needed standing up from a chair using your arms (e.g., wheelchair or bedside chair)?: A Lot Help needed to walk in hospital room?: Total Help needed climbing 3-5 steps with a railing? : Total 6 Click Score: 10    End of Session Equipment Utilized During Treatment: Gait belt Activity Tolerance: Patient limited by fatigue Patient left:  with bed alarm set;with call bell/phone within reach;with family/visitor present Nurse Communication: Mobility status PT Visit Diagnosis: Muscle weakness (generalized) (M62.81);Difficulty in walking, not elsewhere classified (R26.2);Unsteadiness on feet (R26.81)    Time: 1403-1430 PT Time Calculation (min) (ACUTE ONLY): 27 min   Charges:   PT Evaluation $PT Eval Low Complexity: 1 Low PT Treatments $Therapeutic Activity: 8-22 mins        Malachi Pro, DPT 05/11/2020, 4:16 PM

## 2020-05-12 ENCOUNTER — Encounter: Payer: Self-pay | Admitting: Internal Medicine

## 2020-05-12 ENCOUNTER — Inpatient Hospital Stay: Payer: Medicaid Other

## 2020-05-12 LAB — CBC WITH DIFFERENTIAL/PLATELET
Abs Immature Granulocytes: 0.89 10*3/uL — ABNORMAL HIGH (ref 0.00–0.07)
Basophils Absolute: 0 10*3/uL (ref 0.0–0.1)
Basophils Relative: 0 %
Eosinophils Absolute: 0.2 10*3/uL (ref 0.0–0.5)
Eosinophils Relative: 1 %
HCT: 29.5 % — ABNORMAL LOW (ref 39.0–52.0)
Hemoglobin: 10.4 g/dL — ABNORMAL LOW (ref 13.0–17.0)
Immature Granulocytes: 5 %
Lymphocytes Relative: 4 %
Lymphs Abs: 0.8 10*3/uL (ref 0.7–4.0)
MCH: 32.6 pg (ref 26.0–34.0)
MCHC: 35.3 g/dL (ref 30.0–36.0)
MCV: 92.5 fL (ref 80.0–100.0)
Monocytes Absolute: 1.1 10*3/uL — ABNORMAL HIGH (ref 0.1–1.0)
Monocytes Relative: 6 %
Neutro Abs: 16.4 10*3/uL — ABNORMAL HIGH (ref 1.7–7.7)
Neutrophils Relative %: 84 %
Platelets: 239 10*3/uL (ref 150–400)
RBC: 3.19 MIL/uL — ABNORMAL LOW (ref 4.22–5.81)
RDW: 12.3 % (ref 11.5–15.5)
Smear Review: NORMAL
WBC: 19.4 10*3/uL — ABNORMAL HIGH (ref 4.0–10.5)
nRBC: 0 % (ref 0.0–0.2)

## 2020-05-12 LAB — COMPREHENSIVE METABOLIC PANEL
ALT: 22 U/L (ref 0–44)
AST: 31 U/L (ref 15–41)
Albumin: 1.6 g/dL — ABNORMAL LOW (ref 3.5–5.0)
Alkaline Phosphatase: 104 U/L (ref 38–126)
Anion gap: 10 (ref 5–15)
BUN: 14 mg/dL (ref 6–20)
CO2: 27 mmol/L (ref 22–32)
Calcium: 7.8 mg/dL — ABNORMAL LOW (ref 8.9–10.3)
Chloride: 97 mmol/L — ABNORMAL LOW (ref 98–111)
Creatinine, Ser: 0.65 mg/dL (ref 0.61–1.24)
GFR calc Af Amer: >60 mL/min (ref >60)
GFR calc non Af Amer: >60 mL/min (ref >60)
Glucose, Bld: 111 mg/dL — ABNORMAL HIGH (ref 70–99)
Potassium: 3.1 mmol/L — ABNORMAL LOW (ref 3.5–5.1)
Sodium: 134 mmol/L — ABNORMAL LOW (ref 135–145)
Total Bilirubin: 1.2 mg/dL (ref 0.3–1.2)
Total Protein: 4.5 g/dL — ABNORMAL LOW (ref 6.5–8.1)

## 2020-05-12 LAB — URINALYSIS, ROUTINE W REFLEX MICROSCOPIC
Bilirubin Urine: NEGATIVE
Glucose, UA: NEGATIVE mg/dL
Hgb urine dipstick: NEGATIVE
Ketones, ur: 5 mg/dL — AB
Leukocytes,Ua: NEGATIVE
Nitrite: NEGATIVE
Protein, ur: NEGATIVE mg/dL
Specific Gravity, Urine: 1.03 (ref 1.005–1.030)
pH: 6 (ref 5.0–8.0)

## 2020-05-12 LAB — MAGNESIUM: Magnesium: 1.6 mg/dL — ABNORMAL LOW (ref 1.7–2.4)

## 2020-05-12 LAB — AMMONIA: Ammonia: <9 umol/L — ABNORMAL LOW (ref 9–35)

## 2020-05-12 LAB — T4, FREE: Free T4: 1.1 ng/dL (ref 0.61–1.12)

## 2020-05-12 LAB — PHOSPHORUS: Phosphorus: 4.3 mg/dL (ref 2.5–4.6)

## 2020-05-12 LAB — GLUCOSE, CAPILLARY
Glucose-Capillary: 92 mg/dL (ref 70–99)
Glucose-Capillary: 114 mg/dL — ABNORMAL HIGH (ref 70–99)
Glucose-Capillary: 101 mg/dL — ABNORMAL HIGH (ref 70–99)

## 2020-05-12 LAB — TSH: TSH: 6.997 u[IU]/mL — ABNORMAL HIGH (ref 0.350–4.500)

## 2020-05-12 IMAGING — MR MR HEAD W/O CM
10 series · 48 of 48 positions shown · non-contrast
Comparison: Head CT [DATE].
COMPARISON: Head CT [DATE].

Addendum:
CLINICAL DATA: 34-year-old male with encephalopathy, lethargy.
History of ETOH abuse and abruptly stopped drinking the day before
presentation ([DATE]) and became progressively confused. Was brought
to the ED by his wife and while in the ED was witnessed to have a
seizure

EXAM:
MRI HEAD WITHOUT CONTRAST
TECHNIQUE: Multiplanar, multiecho pulse sequences of the brain and surrounding
structures were obtained without intravenous contrast.

[Series 2: ax dwi_tracew · axial · 3.0mm · 1.31mm/px · z∈[-68,+86]mm · 4 of 48 slices shown]
[im 1/48]
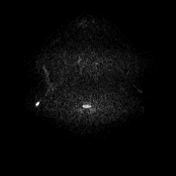
[im 16/48]
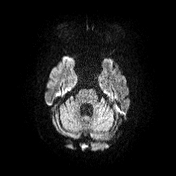
[im 32/48]
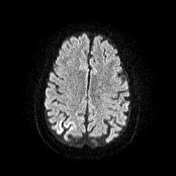
[im 48/48]
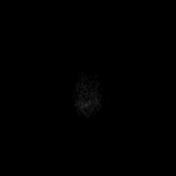

[Series 3: ax dwi_adc · axial · 3.0mm · 1.31mm/px · z∈[-68,+86]mm · 4 of 48 slices shown]
[im 1/48]
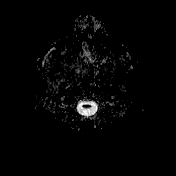
[im 16/48]
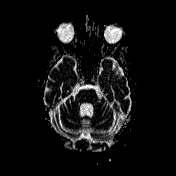
[im 32/48]
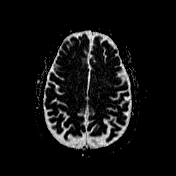
[im 48/48]
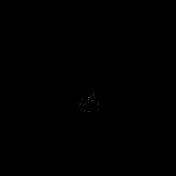

[Series 4: cor dwi_tracew · coronal · 5.0mm · 1.31mm/px · 4 of 38 slices shown]
[im 1/38]
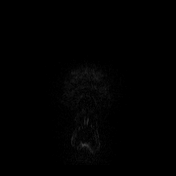
[im 13/38]
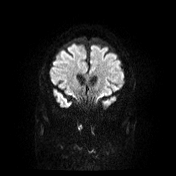
[im 25/38]
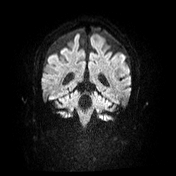
[im 38/38]
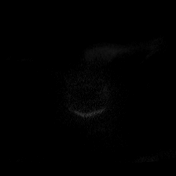

[Series 5: cor dwi_adc · coronal · 5.0mm · 1.31mm/px · 4 of 38 slices shown]
[im 1/38]
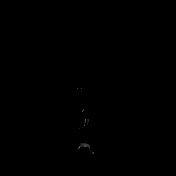
[im 13/38]
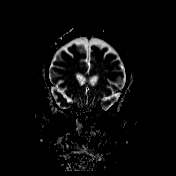
[im 25/38]
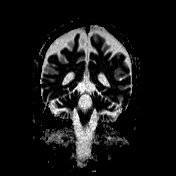
[im 38/38]
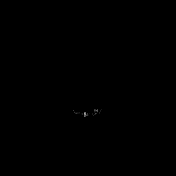

[Series 6: T1 · sagittal · 5.0mm · 0.94mm/px · 2 of 21 slices shown (1 of 2)]
[im 1/21]
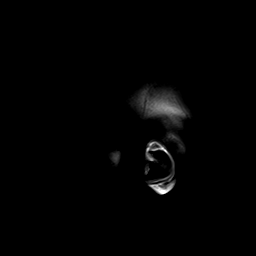
[im 21/21]
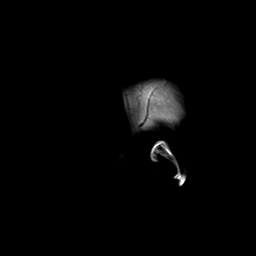

[Series 7: T2 · axial · 5.0mm · 0.45mm/px · z∈[-69,+86]mm · 3 of 27 slices shown (1 of 2)]
[im 1/27]
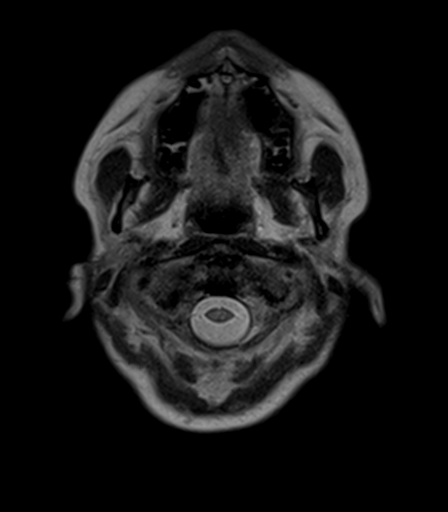
[im 14/27]
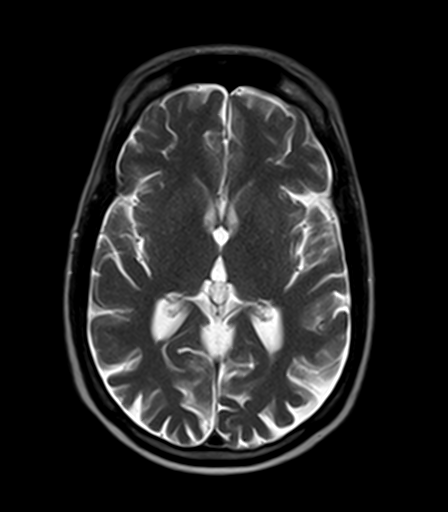
[im 27/27]
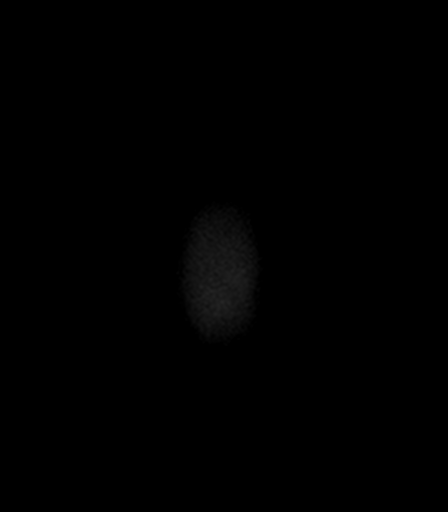

[Series 9: pha_images · axial · 4.0mm · 0.90mm/px · z∈[-68,+87]mm · 4 of 40 slices shown]
[im 1/40]
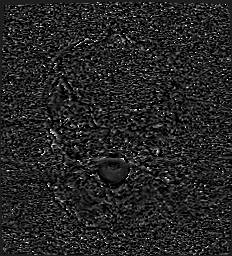
[im 14/40]
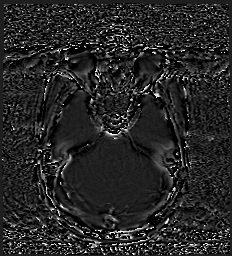
[im 27/40]
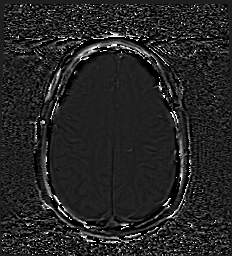
[im 40/40]
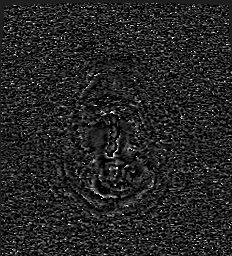

[Series 10: swi_images · axial · 4.0mm · 0.90mm/px · z∈[-68,+87]mm · 4 of 40 slices shown]
[im 1/40]
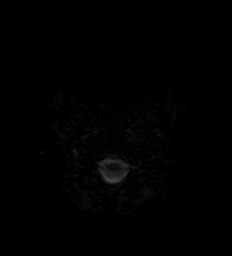
[im 14/40]
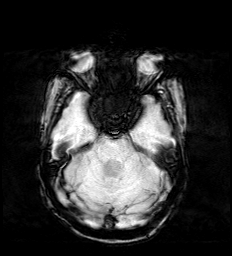
[im 27/40]
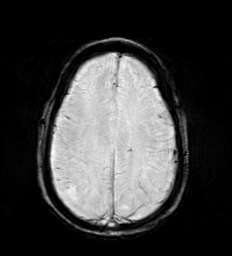
[im 40/40]
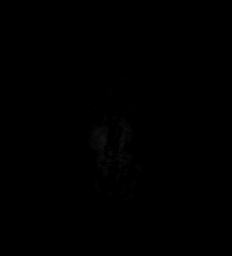

[Series 13: T1 · axial · 1.0mm · 0.98mm/px · z∈[-78,+94]mm · 16 of 175 slices shown (2 of 2)]
[im 1/175]
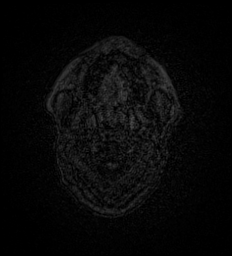
[im 12/175]
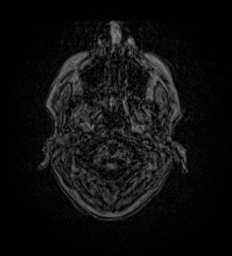
[im 24/175]
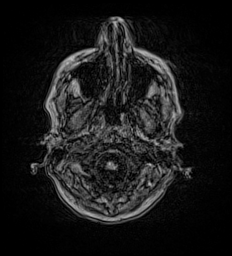
[im 35/175]
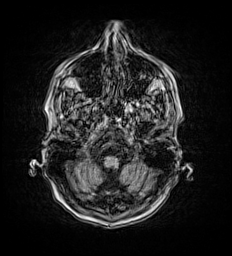
[im 47/175]
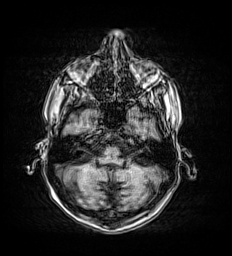
[im 59/175]
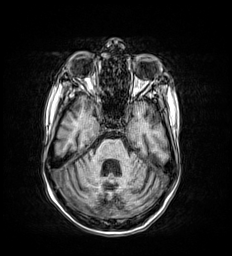
[im 70/175]
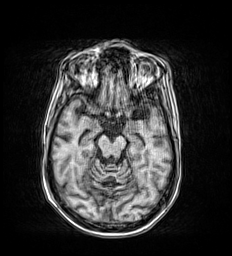
[im 82/175]
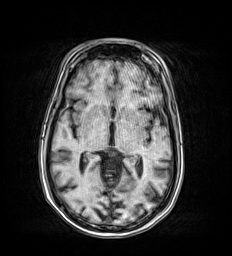
[im 93/175]
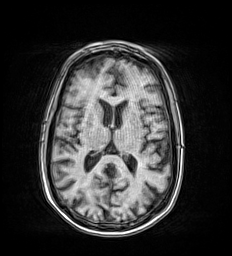
[im 105/175]
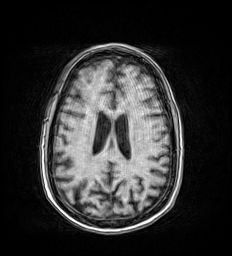
[im 117/175]
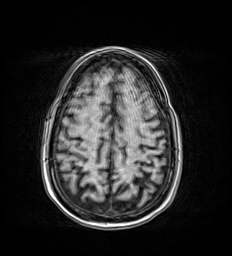
[im 128/175]
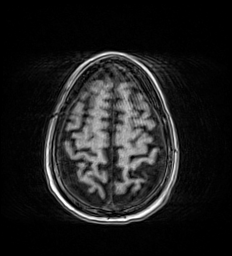
[im 140/175]
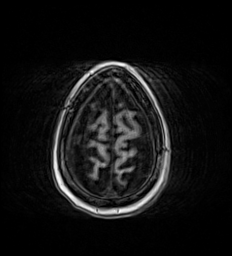
[im 151/175]
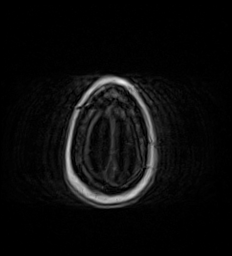
[im 163/175]
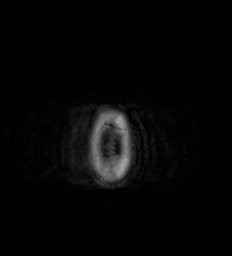
[im 175/175]
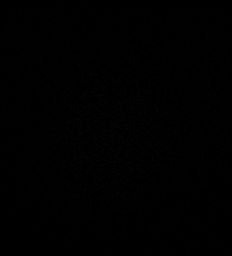

[Series 14: T2 · coronal · 3.0mm · 0.47mm/px · 3 of 35 slices shown (2 of 2)]
[im 1/35]
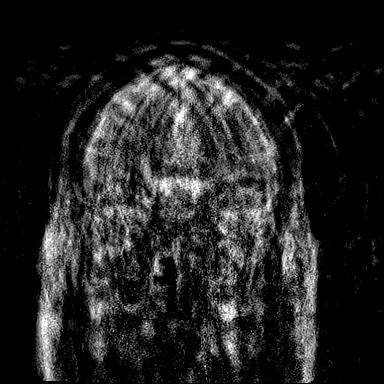
[im 18/35]
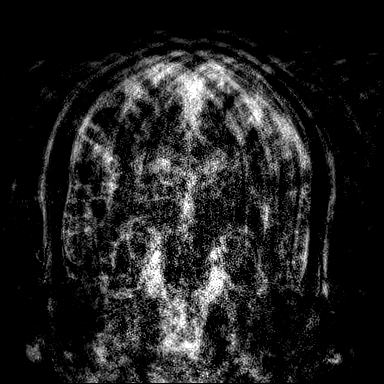
[im 35/35]
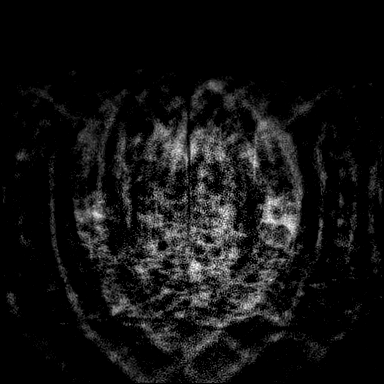

[48 of 48 positions shown; findings below may reference images not displayed]

FINDINGS: The examination had to be discontinued prior to completion by
patient request. Only axial FLAIR imaging was not obtained, although
the coronal T2 imaging is nondiagnostic due to motion.

Brain: There is a 2 cm area of restricted diffusion in the posterior
right cerebellum on series 2, image 14 with patchy T2
hyperintensity. No associated hemorrhage or mass effect. No evidence
of this on the comparison CT.

Furthermore there is gyriform restricted diffusion in the right
parietal lobe on series 2, image 31, and a punctate area of cortical
restricted diffusion in the left parietal lobe on image 33. There
might also be trace restricted diffusion in the medial left
occipital pole.

No evidence of associated hemorrhage or intracranial mass effect.

Superimposed severely age advanced volume loss of the brainstem and
cerebellum (series 6, image 11). No ventriculomegaly. No extra-axial
collection. Negative pituitary. Negative cervicomedullary junction.

Vascular: Major intracranial arterial flow voids appear grossly
preserved. However, there is an abnormal appearance of the torcula
and left transverse sinus on DWI imaging, with a slightly
heterogeneous appearance of the sinuses on T2.

Skull and upper cervical spine: Negative visible cervical spine and
bone marrow signal.

Sinuses/Orbits: Negative orbits. Trace paranasal sinus mucosal
thickening.

Other: Mastoid air cells are clear. Visible internal auditory
structures appear normal.
IMPRESSION: 1. Mildly motion degraded study which had to be discontinued prior
to completion.

2. Appearance suspicious for Dural Venous Sinus Thrombosis at the
torcula and left transverse sinus.
Head MRV without and with contrast would best evaluate further.
Alternatively, CT Venogram (CT head with contrast using venogram
protocol) could be pursued if the patient is unable to cooperate
with MRI.

3. Associated abnormal diffusion in the parietal lobes, right
cerebellum, and possibly also the left occipital pole which may
reflect a combination of venous infarcts and postictal changes.

4. No associated hemorrhage or mass effect.

5. Underlying severely age advanced volume loss of the brainstem and
cerebellum

ADDENDUM:
Study discussed by telephone with Dr. KOVUS on [DATE] at
[ZO] hours. We discussed that CT Venogram may be the modality of
choice for follow-up.

*** End of Addendum ***
FINDINGS: The examination had to be discontinued prior to completion by
patient request. Only axial FLAIR imaging was not obtained, although
the coronal T2 imaging is nondiagnostic due to motion.

Brain: There is a 2 cm area of restricted diffusion in the posterior
right cerebellum on series 2, image 14 with patchy T2
hyperintensity. No associated hemorrhage or mass effect. No evidence
of this on the comparison CT.

Furthermore there is gyriform restricted diffusion in the right
parietal lobe on series 2, image 31, and a punctate area of cortical
restricted diffusion in the left parietal lobe on image 33. There
might also be trace restricted diffusion in the medial left
occipital pole.

No evidence of associated hemorrhage or intracranial mass effect.

Superimposed severely age advanced volume loss of the brainstem and
cerebellum (series 6, image 11). No ventriculomegaly. No extra-axial
collection. Negative pituitary. Negative cervicomedullary junction.

Vascular: Major intracranial arterial flow voids appear grossly
preserved. However, there is an abnormal appearance of the torcula
and left transverse sinus on DWI imaging, with a slightly
heterogeneous appearance of the sinuses on T2.

Skull and upper cervical spine: Negative visible cervical spine and
bone marrow signal.

Sinuses/Orbits: Negative orbits. Trace paranasal sinus mucosal
thickening.

Other: Mastoid air cells are clear. Visible internal auditory
structures appear normal.
IMPRESSION: 1. Mildly motion degraded study which had to be discontinued prior
to completion.

2. Appearance suspicious for Dural Venous Sinus Thrombosis at the
torcula and left transverse sinus.
Head MRV without and with contrast would best evaluate further.
Alternatively, CT Venogram (CT head with contrast using venogram
protocol) could be pursued if the patient is unable to cooperate
with MRI.

3. Associated abnormal diffusion in the parietal lobes, right
cerebellum, and possibly also the left occipital pole which may
reflect a combination of venous infarcts and postictal changes.

4. No associated hemorrhage or mass effect.

5. Underlying severely age advanced volume loss of the brainstem and
cerebellum

## 2020-05-12 IMAGING — MR MR ABDOMEN WO/W CM MRCP
12 of 14 series · 44 of 48 positions shown · IV contrast (gadavist)
Comparison: CT abdomen pelvis, [DATE]

CLINICAL DATA: Pancreatic cyst/pseudocyst, altered mental status,
chronic alcohol abuse, prior abnormal CT

EXAM:
MRI ABDOMEN WITHOUT AND WITH CONTRAST (INCLUDING MRCP)
TECHNIQUE: Multiplanar multisequence MR imaging of the abdomen was performed
both before and after the administration of intravenous contrast.
Heavily T2-weighted images of the biliary and pancreatic ducts were
obtained, and three-dimensional MRCP images were rendered by post
processing.
CONTRAST:  6mL GADAVIST GADOBUTROL 1 MMOL/ML IV SOLN

[Series 2: T2 · coronal · 6.0mm · 1.19mm/px · 1 of 30 slices shown (1 of 2)]
[im 1/30]
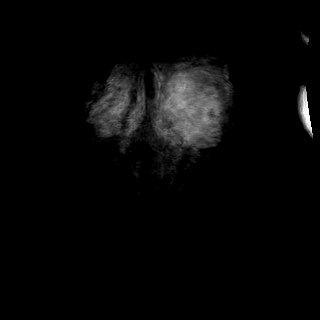

[Series 3: T2 · axial · 6.0mm · 1.19mm/px · 1 of 32 slices shown (2 of 2)]
[im 1/32]
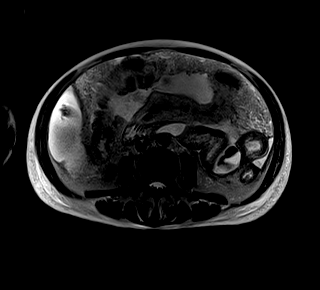

[Series 4: T2 fat-sat · axial · 6.0mm · 1.19mm/px · 1 of 32 slices shown]
[im 1/32]
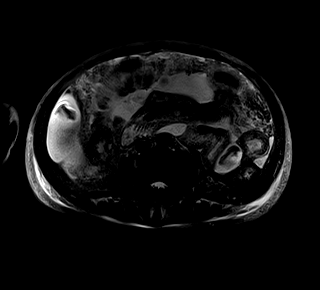

[Series 5: bSSFP · axial · 6.0mm · 0.74mm/px · 1 of 32 slices shown]
[im 1/32]
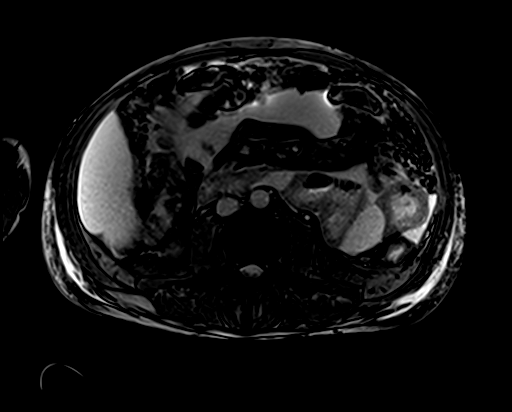

[Series 6: T1 · axial · 6.0mm · 0.74mm/px · z∈[-134,+89]mm · 2 of 64 slices shown]
[im 1/64]
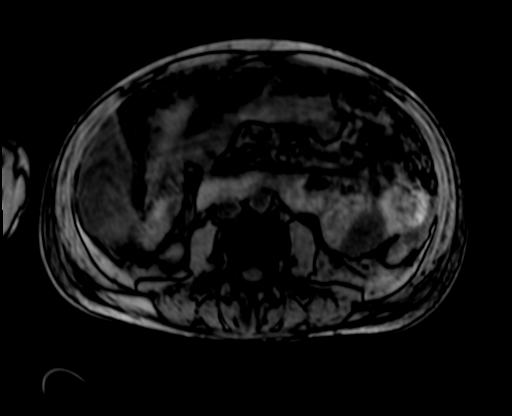
[im 64/64]
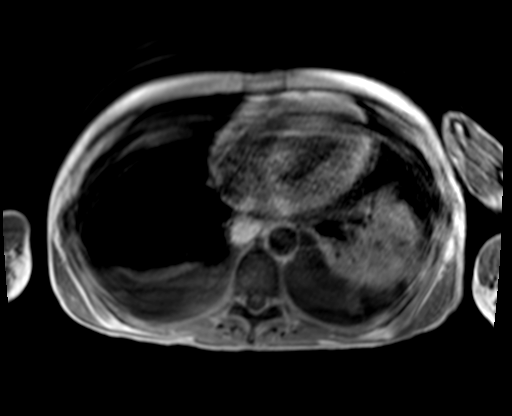

[Series 7: ax dwi_tracew · axial · 6.0mm · 1.42mm/px · z∈[-134,+89]mm · 3 of 96 slices shown]
[im 1/96]
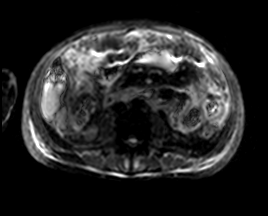
[im 48/96]
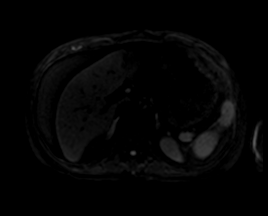
[im 96/96]
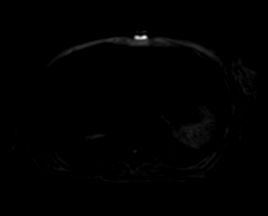

[Series 8: ax dwi_adc · axial · 6.0mm · 1.42mm/px · 1 of 32 slices shown]
[im 1/32]
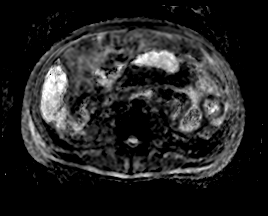

[Series 13: T1 fat-sat · axial · 3.0mm · 1.56mm/px · z∈[-159,+102]mm · 3 of 88 slices shown (1 of 2)]
[im 1/88]
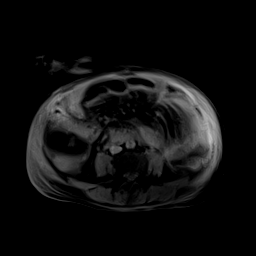
[im 44/88]
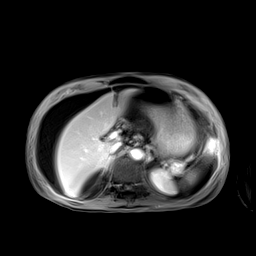
[im 88/88]
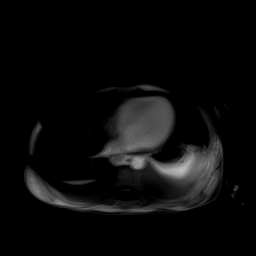

[Series 14: MRCP · coronal · 3.0mm · 1.12mm/px · 1 of 17 slices shown]
[im 1/17]
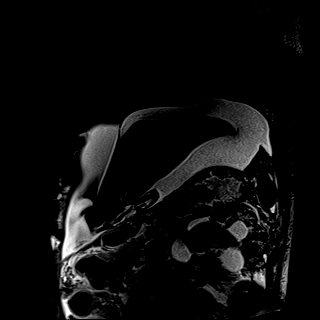

[Series 15: radials · coronal · 50.0mm · 0.78mm/px · 1 of 5 slices shown]
[im 1/5]
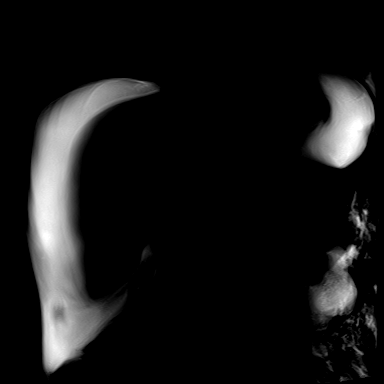

[Series 16: T1 fat-sat · axial · 3.0mm · 1.56mm/px · z∈[-159,+102]mm · 16 of 440 slices shown (2 of 2)]
[im 1/440]
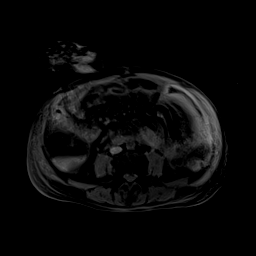
[im 30/440]
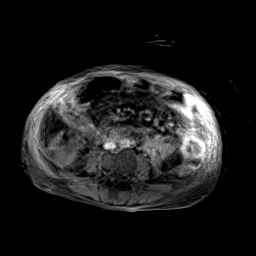
[im 59/440]
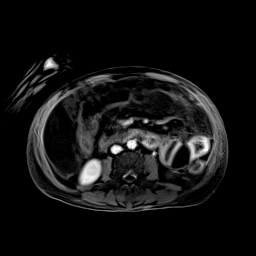
[im 88/440]
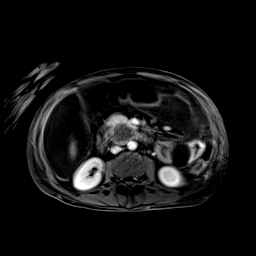
[im 118/440]
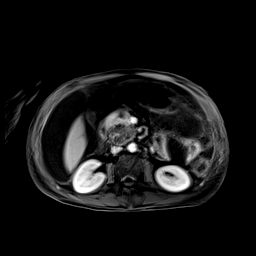
[im 147/440]
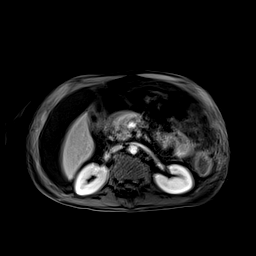
[im 176/440]
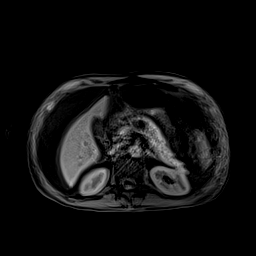
[im 205/440]
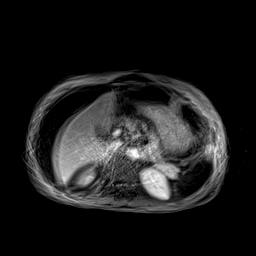
[im 235/440]
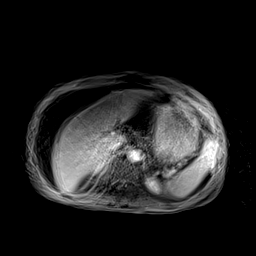
[im 264/440]
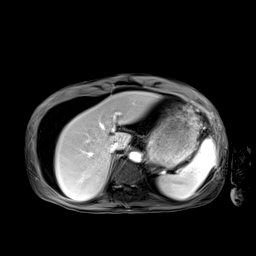
[im 293/440]
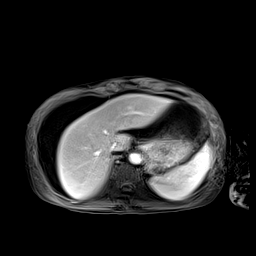
[im 322/440]
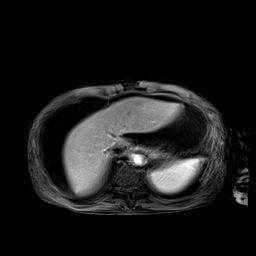
[im 352/440]
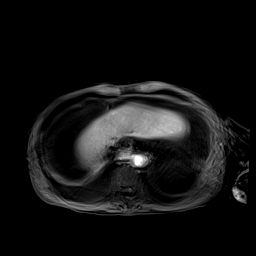
[im 381/440]
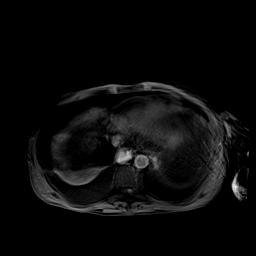
[im 410/440]
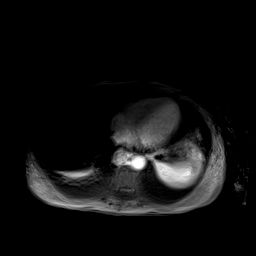
[im 440/440]
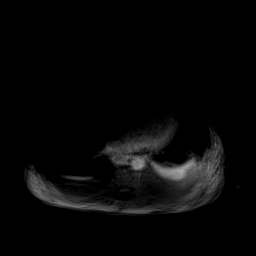

[Series 1001: results sub_p1_t1 fatsat · axial · 3.0mm · 1.56mm/px · z∈[-159,+102]mm · 13 of 352 slices shown]
[im 1/352]
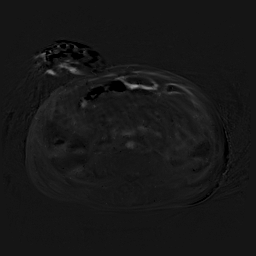
[im 30/352]
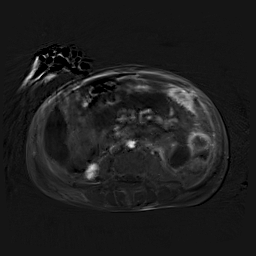
[im 59/352]
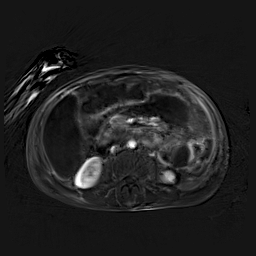
[im 88/352]
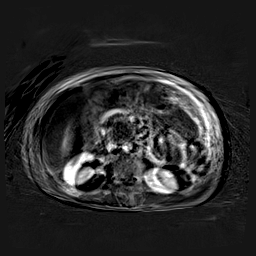
[im 118/352]
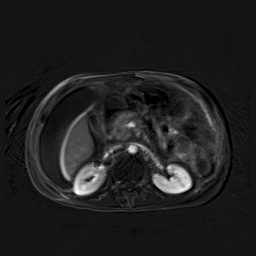
[im 147/352]
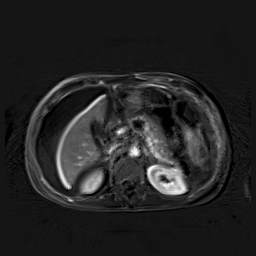
[im 176/352]
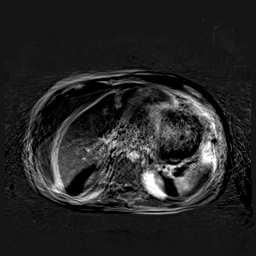
[im 205/352]
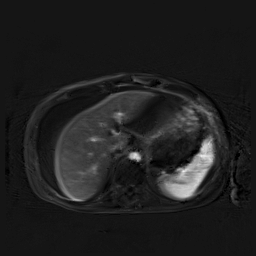
[im 235/352]
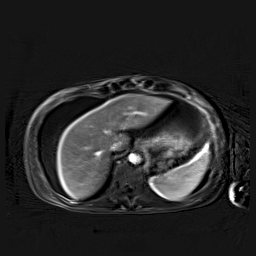
[im 264/352]
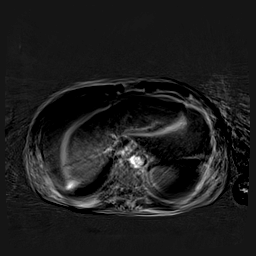
[im 293/352]
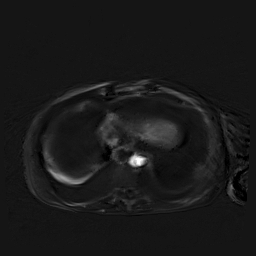
[im 322/352]
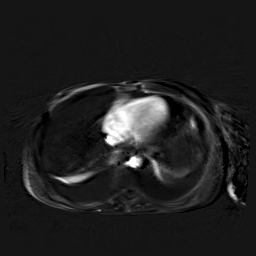
[im 352/352]
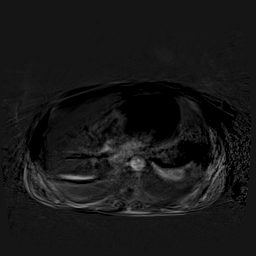

[44 of 48 positions shown; findings below may reference images not displayed]

FINDINGS: Lower chest: Moderate bilateral pleural effusions.

Hepatobiliary: No mass or other parenchymal abnormality identified
on noncontrast imaging. No obvious, overt morphologic stigmata of
cirrhosis. Gallbladder wall thickening, nonspecific in the setting
of ascites.

Pancreas: Multiple redemonstrated fluid signal lesions within the
pancreatic head and neck, the largest in the pancreatic head
adjacent to the distal pancreatic duct measuring 3.8 x 3.3 cm
(series 4, image 27). There is no pancreatic ductal dilatation.
There may be some adjacent peripancreatic inflammatory stranding,
difficult to discern in the setting of large volume ascites.

Spleen:  Within normal limits in size and appearance.

Adrenals/Urinary Tract: No masses identified. No evidence of
hydronephrosis.

Stomach/Bowel: Visualized portions within the abdomen are
unremarkable.

Vascular/Lymphatic: No pathologically enlarged lymph nodes
identified. No abdominal aortic aneurysm demonstrated.

Other:  Large volume ascites throughout the abdomen.

Musculoskeletal: No suspicious bone lesions identified.
IMPRESSION: 1. Multiple redemonstrated fluid signal lesions within the
pancreatic head and neck, the largest in the pancreatic head
adjacent to the distal pancreatic duct measuring 3.8 cm. There may
be some adjacent peripancreatic inflammatory stranding, difficult to
discern in the setting of large volume ascites. These are almost
certainly pancreatic pseudocysts given patient age and clinical
history. No pancreatic ductal dilatation.
2. No obvious, overt morphologic stigmata of cirrhosis. No evidence
of hepatic mass or other parenchymal abnormality on noncontrast
imaging.
3. Large volume ascites throughout the abdomen.
4. Moderate bilateral pleural effusions.

## 2020-05-12 IMAGING — CT CT VENOGRAM HEAD
4 of 11 series · 17 of 47 positions shown · IV contrast (omnipaque)
Comparison: Brain MRI [DATE].

CLINICAL DATA: Dural venous sinus thrombosis suspected.

EXAM:
CT VENOGRAM HEAD
TECHNIQUE: Postcontrast CT venography of the head was performed following the
administration of 75 mL Omnipaque 350 intravenous contrast. Coronal
and sagittal reconstructions were submitted for evaluation. Axial,
coronal and sagittal MIP reconstructions were also submitted.
CONTRAST:  75mL OMNIPAQUE IOHEXOL 350 MG/ML SOLN

[Series 2: head bone · axial · 0.41mm/px · z∈[-181,-111]mm · 4 of 82 slices shown]
[im 12/82  bone]
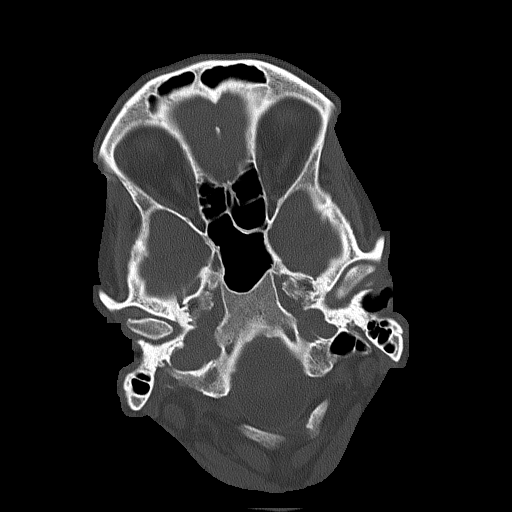
[im 24/82  bone]
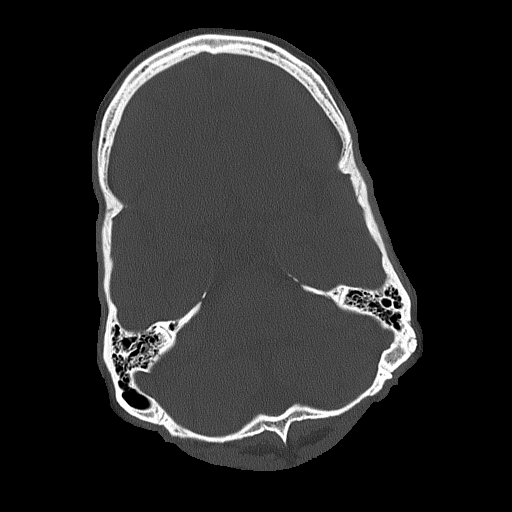
[im 35/82  bone]
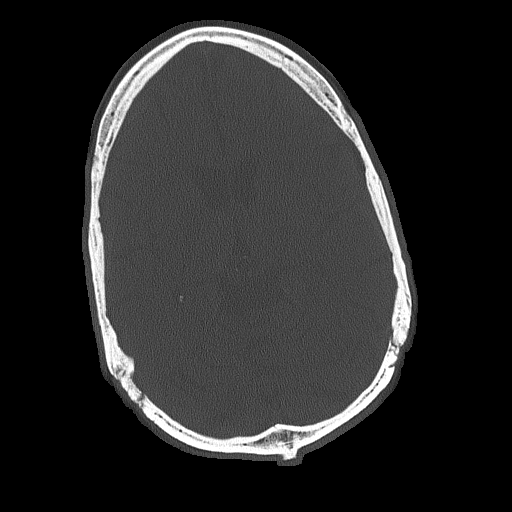
[im 47/82  bone]
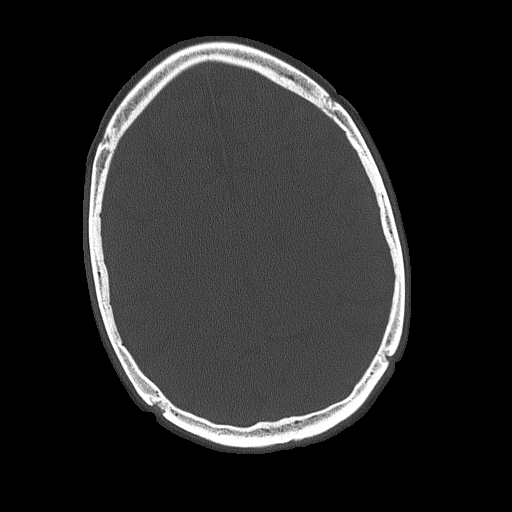

[Series 4: cor soft · coronal · 0.32mm/px · 1 of 67 slices shown]
[im 34/67  brain]
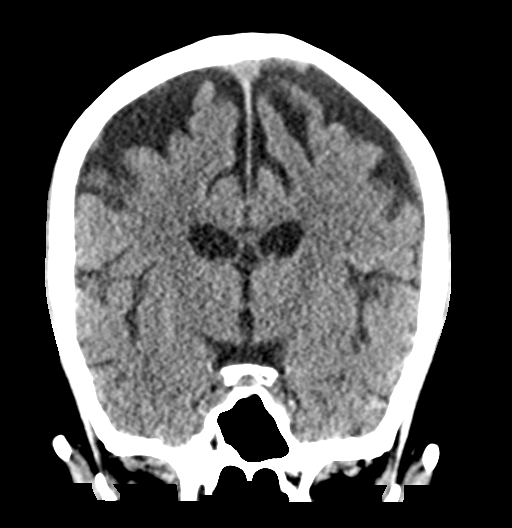

[Series 6: head w · axial · 0.41mm/px · z∈[-181,-71]mm · 6 of 79 slices shown]
[im 12/79  brain]
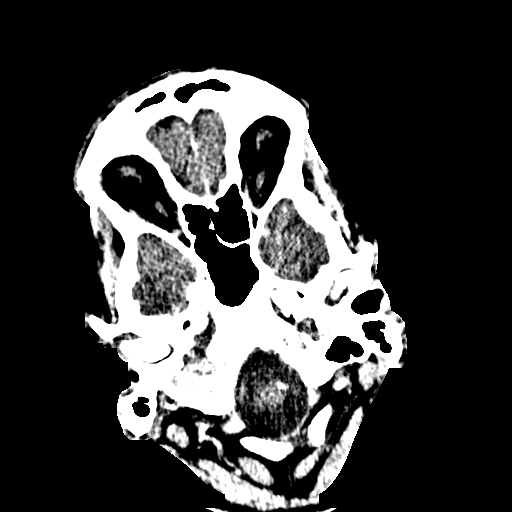
[im 23/79  bone]
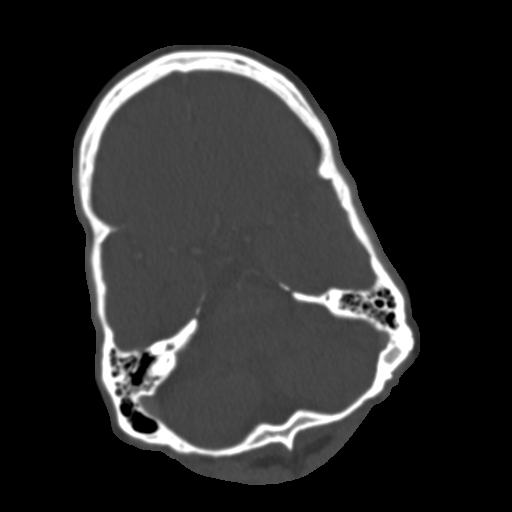
[im 34/79  brain]
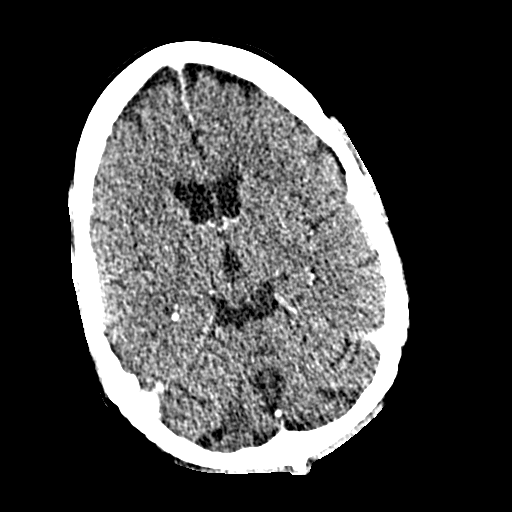
[im 45/79  bone]
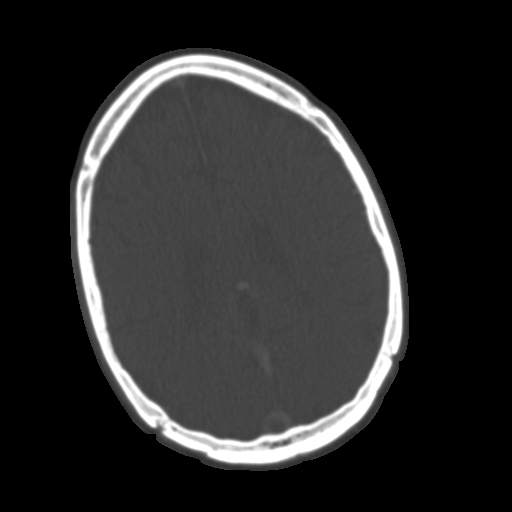
[im 56/79  brain]
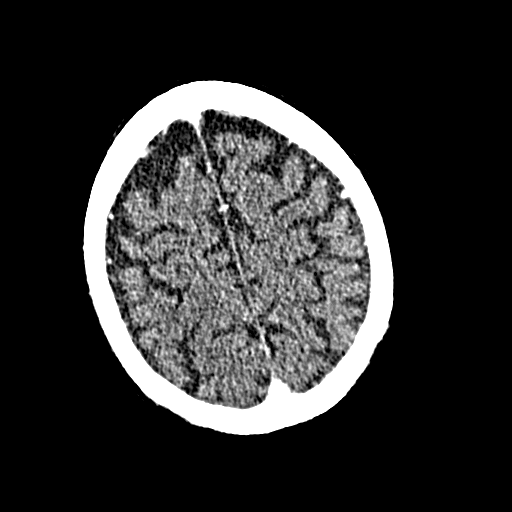
[im 67/79  bone]
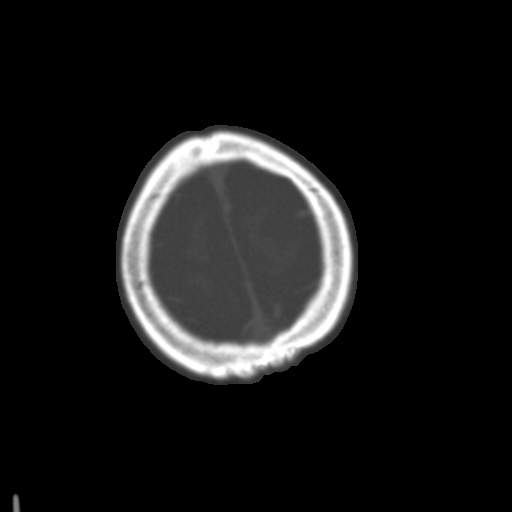

[Series 13: ax head w recon · axial · 0.30mm/px · z∈[-179,-76]mm · 6 of 75 slices shown]
[im 11/75  brain]
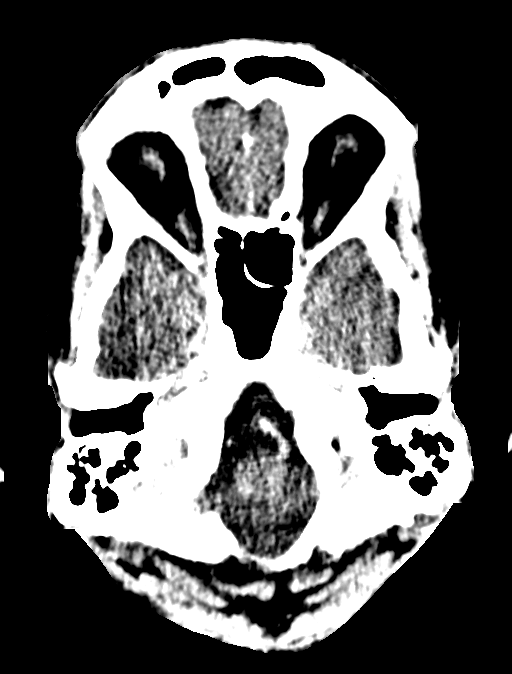
[im 22/75  brain]
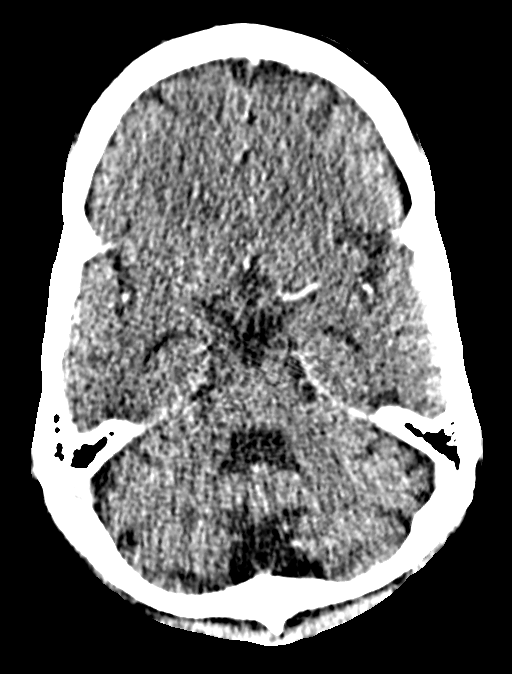
[im 32/75  brain]
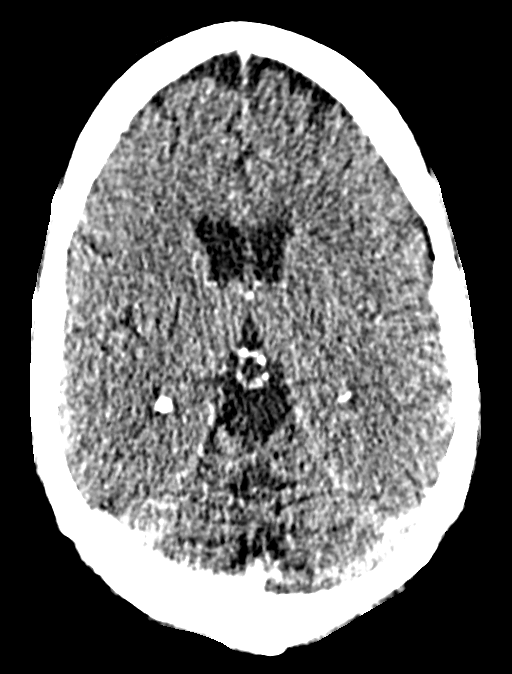
[im 43/75  brain]
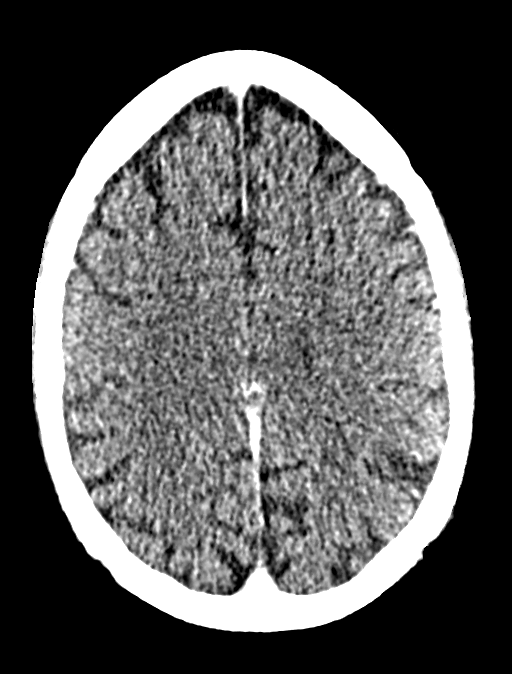
[im 53/75  brain]
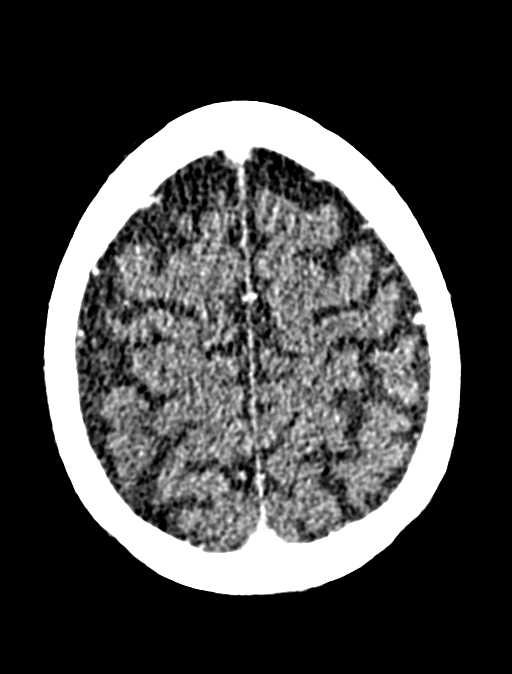
[im 64/75  brain]
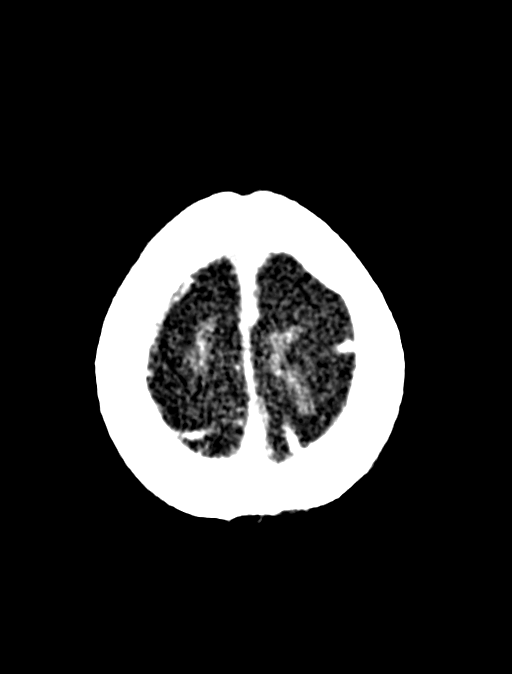

[17 of 47 positions shown; findings below may reference images not displayed]

FINDINGS: Stable cerebellar greater than cerebral atrophy

Unchanged size of a small acute right cerebellar infarct. Of note,
additional small foci of diffusion weighted abnormality were
demonstrated within the parietal lobes on brain MRI performed
earlier the same day.

There is no acute intracranial hemorrhage.

No extra-axial fluid collection.

No evidence of intracranial mass.

No midline shift.

There is a filling defect within the confluence of sinuses and
extending into the left transverse dural venous sinus compatible
with dural venous sinus thrombosis. The superior sagittal sinus,
internal cerebral veins, vein of SCHAFFER, straight sinus, right
transverse sinus, sigmoid sinuses and visualized jugular veins are
patent.

No calvarial fracture or suspicious osseous lesion.

Visualized orbits show no acute finding. No significant paranasal
sinus disease or mastoid effusion at the imaged levels.

These results will be called to the ordering clinician or
representative by the Radiologist Assistant, and communication
documented in the PACS or [REDACTED].
IMPRESSION: Confirmed thrombus within the confluence of sinuses and left
transverse dural venous sinus.

Redemonstrated small acute infarct within the right cerebellum. No
evidence of hemorrhagic conversion.

Of note, small foci of restricted diffusion were demonstrated within
the bilateral parietal lobes on same-day brain MRI. Please refer to
this prior examination for further description.

## 2020-05-12 MED ORDER — POTASSIUM CHLORIDE CRYS ER 20 MEQ PO TBCR
40.0000 meq | EXTENDED_RELEASE_TABLET | Freq: Every day | ORAL | Status: AC
  Administered 2020-05-12: 40 meq via ORAL
  Filled 2020-05-12: qty 2

## 2020-05-12 MED ORDER — POTASSIUM CHLORIDE CRYS ER 20 MEQ PO TBCR
40.0000 meq | EXTENDED_RELEASE_TABLET | Freq: Once | ORAL | Status: AC
  Administered 2020-05-12: 40 meq via ORAL
  Filled 2020-05-12: qty 2

## 2020-05-12 MED ORDER — ATORVASTATIN CALCIUM 20 MG PO TABS
40.0000 mg | ORAL_TABLET | Freq: Every day | ORAL | Status: DC
  Administered 2020-05-12 – 2020-05-15 (×4): 40 mg via ORAL
  Filled 2020-05-12 (×5): qty 2

## 2020-05-12 MED ORDER — IOHEXOL 350 MG/ML SOLN
75.0000 mL | Freq: Once | INTRAVENOUS | Status: AC | PRN
  Administered 2020-05-12: 17:00:00 75 mL via INTRAVENOUS

## 2020-05-12 MED ORDER — NALTREXONE HCL 50 MG PO TABS
50.0000 mg | ORAL_TABLET | Freq: Every day | ORAL | Status: DC
  Administered 2020-05-12 – 2020-05-13 (×2): 50 mg via ORAL
  Filled 2020-05-12 (×2): qty 1

## 2020-05-12 MED ORDER — ASPIRIN EC 81 MG PO TBEC
81.0000 mg | DELAYED_RELEASE_TABLET | Freq: Every day | ORAL | Status: DC
  Administered 2020-05-12 – 2020-05-14 (×3): 81 mg via ORAL
  Filled 2020-05-12 (×3): qty 1

## 2020-05-12 MED ORDER — LOPERAMIDE HCL 2 MG PO CAPS
2.0000 mg | ORAL_CAPSULE | ORAL | Status: DC | PRN
  Administered 2020-05-12 – 2020-05-21 (×3): 2 mg via ORAL
  Filled 2020-05-12 (×3): qty 1

## 2020-05-12 MED ORDER — GADOBUTROL 1 MMOL/ML IV SOLN
6.0000 mL | Freq: Once | INTRAVENOUS | Status: AC | PRN
  Administered 2020-05-12: 13:00:00 6 mL via INTRAVENOUS

## 2020-05-12 MED ORDER — SACCHAROMYCES BOULARDII 250 MG PO CAPS
250.0000 mg | ORAL_CAPSULE | Freq: Two times a day (BID) | ORAL | Status: DC
  Administered 2020-05-12 – 2020-05-14 (×6): 250 mg via ORAL
  Filled 2020-05-12 (×12): qty 1

## 2020-05-12 MED ORDER — CARVEDILOL 3.125 MG PO TABS
12.5000 mg | ORAL_TABLET | Freq: Two times a day (BID) | ORAL | Status: DC
  Administered 2020-05-12 – 2020-05-13 (×3): 12.5 mg via ORAL
  Filled 2020-05-12 (×3): qty 4

## 2020-05-12 MED ORDER — MAGNESIUM SULFATE 2 GM/50ML IV SOLN
2.0000 g | Freq: Once | INTRAVENOUS | Status: AC
  Administered 2020-05-12: 2 g via INTRAVENOUS
  Filled 2020-05-12: qty 50

## 2020-05-12 MED ORDER — PANTOPRAZOLE SODIUM 40 MG PO TBEC
40.0000 mg | DELAYED_RELEASE_TABLET | Freq: Two times a day (BID) | ORAL | Status: DC
  Administered 2020-05-12 – 2020-05-15 (×6): 40 mg via ORAL
  Filled 2020-05-12 (×7): qty 1

## 2020-05-12 MED ORDER — SODIUM CHLORIDE 0.9 % IV SOLN
INTRAVENOUS | Status: DC | PRN
  Administered 2020-05-12 – 2020-05-13 (×3): 250 mL via INTRAVENOUS

## 2020-05-12 NOTE — Progress Notes (Addendum)
PROGRESS NOTE    Shane Holtzer Sr.   ZOX:[id number]  DOB: [birth year]  PCP: [name], MD    DOA: 05/03/2020 LOS: 48   Brief Narrative   34 year old gentleman with no medical history, chronic alcoholism, has been drinking nonstop about 12 packs of beer daily since age of 73 presented to the ED on  05/03/2020 for evaluation of progressive confusion and lethargy.  He also had episode of unresponsiveness in the triage area.  As per patient's fianc, patient stopped drinking about 24 hours prior to arrival and started behaving altered, confused.  At the same time, patient has multiple episodes of loose watery stool, some of them dark appearing.  No nausea or vomiting.  In the ED, he was ill-appearing, BP stable, tachycardic with heart rate 141, respirations 21, and afebrile.  Labs were notable for sodium 124, potassium was 2.8, creatinine 1.75, bilirubin 2.  Anion gap 19.  CK 700.  Chest x-ray was essentially normal.  CT head was normal.    CT abdomen/pelvis showed:  Edema adjacent to head and body of pancreas question pancreatitis (recommend correlation with serum lipase/amylase levels), cystic lesions at the pancreas, largest at head 4.4 cm diameter, could represent pseudocysts in a patient with pancreatitis or cystic pancreatic neoplasms; recommend attention on follow-up imaging to ensure resolution, or may characterize by MR, moderate ascites with mild fatty  infiltration of liver, small BILATERAL pleural effusions and minimal bibasilar atelectasis (other non-pertinent findings in report).   Admitted to the hospitalist service for further evaluation and management on acute alcohol withdrawal syndrome, acute pancreatitis and possible SBP (ruled out).     Patient also with abdominal pain and diarrhea and worsening distention.  General surgery was consulted.  Intra-abdominal pressure monitoring < 20 and stable hemodynamics, no end organ damage.  Paracentesis on 6/28 yielded 2.8 L ascitic fluid  with elevated neutrophils.  Started on meropenem pending fluid cultures, this was stopped after no growth on cultures at 48 hours.  Abdominal distention remains stable and no tenderness.      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  Similar parietal lobe findings as MRI.  PLAN - neurology to follow up tomorrow.  Started ASA and statin.  Will defer heparin gtt for now for concern of hemorrhagic conversion of the acute cerebellar infarct.  Neuro checks.   Generalized weakness - due to chronic alcoholism and malnutrition as well as prolonged acute illness.  He is requiring heavy assistance with bed mobility and standing.   PT recommends SNF.  TOC consulted.    Acute metabolic encephalopathy - improving, still waxes and wanes. With significantly lethargy since admission, more awake and interactive 7/2, but still confused.  He is no longer receiving benzos for alcohol withdrawal.  CT head upon admission was negative for acute findings that showed generalized atrophy advanced for his age.  Exam is non-focal.  Ammonia level normal.  B12 normal.  UDS on admission was never done, please collect and process.  Neurology consulted, appreciate input.  EEG and MRI brain pending.  Follow up thiamine levels.   TSH mildly elevated, f/u T4 level.  Repeat ammonia level 7/3 was <9.  Alcohol withdrawal syndrome - present on admission Alcohol dependence / Alcohol use disorder Completed Librium taper.  Stop CIWA protocol.  Continue thiamine and folic acid.  Patient agreeable to medication for AUD,  to reduce cravings and assist in maintaining his sobriety, continue on naltrexone 50 mg PO daily (liver function okay and only mild fatty infiltration on imaging).  Recommend inpatient rehab if patient amenable, however he is too weak to ambulate, would need to be stronger and independent.  Advise psychosocial support in community after discharge.  Acute alcoholic pancreatitis -present on admission with abdominal pain, tachycardia,  diarrhea, hypotension.  No gallbladder abnormalities on CT abdomen.  Continue supportive care with IV hydration, pain control.  Advance diet to soft, further advance as tolerated. Has been off meropenem given negative ascites fluid cultures have been negative.  Monitor abdominal exam.   With worsening abdominal distention, surgery was consulted.  Intra-abdominal pressure monitoring showed intra-abdominal pressure less than 20 with no hemodynamic instability.  No endorgan damage.  Surgery recommends management of ascites and pancreatitis.  Elevated TSH - free T4 pending.  Severe Protein Calorie Malnutrition - POA, due to chronic alcohol abuse and not eating regular food.  Minimal PO intake during admission, but diet started on 7/3.  Patient tolerating but not taking in enough calories yet.  Dietician consulted.  May require tube feeding soon, recommendations have been made.  Daily CMP, Mg, Phos.  High risk of refeeding syndrome.    Hematuria - new 7/3, in addition to pt reporting dysuria.  UA with reflex is pending.  Diarrhea - present on admission, persistent.  No fevers or leukocytosis, very unlikely infectious.  Suspect malabsorption due to pancreatitis.  Imodium PRN.  Leukocytosis - due to pancreatitis, no other evidence of infection.  UA pending Sinus tachycardia - persistent, likely due to alcohol withdrawal and pain from pancreatitis.  No evidence of infection / sepsis.  Ascites fluid cultures have been negative.  Started low dose of Coreg for BP and HR control.   Continue to manage underlying issues as above.  Will check blood cultures.  No respiratory or urinary symptoms, no fevers to suggest infection.  Ascites - his liver has mild fatty infiltration on imaging, not cirrhotic at this point, this is likely due to pancreatitis and hypoalbuminemia.  Underwent paracentesis on 6/28 with 2.8 L fluid removed. Ordered repeat paracentesis (7/1) due to increased distention, but not enough fluid seen on ultrasound to do safely.  Continue Lasix and spironolactone.    Chest pain - Resolved.  Reported by pt on AM of 7/1.  Troponin mildly elevated at 32, likely demand ischemia from persistent tachycardia in setting of alcohol withdrawal.  No acute ischemic changes on EKG, only non-specific T wave changes.  Monitor.  Hypervolemic hyponatremia -POA with sodium 124.   Suspect due to liver disease.  Monitor BMP.  Improving with diuretics.  Na 134.  Acute renal failure -present on admission with creatinine 1.75.  Likely prerenal azotemia in the setting of above.  Improved with IV hydration.  Monitor BMP and urine output.  Hypokalemia -present on admission.  Replaced with improvement.    Monitor BMP, mag and Foss    DVT prophylaxis: SCDs Start: 05/03/20 2153   Diet:  Diet Orders (From admission, onward)    Start     Ordered   05/11/20 1118  DIET SOFT Room service appropriate? Yes; Fluid consistency: Thin  Diet effective now  Question Answer Comment  Room service appropriate? Yes   Fluid consistency: Thin      05/11/20 1118            Code Status: Full Code    Subjective 05/12/20    Patient seen at bedside with significant other present.  Patient is confused today, thinks he is in [place], saying things that don't make sense, but is awake and interactive.  Incontinent of stool.  Continues to have diarrhea, no abdominal pain or N/V.  Reports some dysuria and bloody urine.  No fevers or chills.  Says he wants to go sleep at home tonight and come back tomorrow.     Disposition Plan & Communication   Status is: Inpatient  Remains inpatient appropriate because:Inpatient level of care appropriate due to severity of illness.  With recurrent encephalopathy.  Profound generalized weakness and inability to stand or ambulate, disposition planning ongoing.  He is very high risk for refeeding syndrome and requires close monitoring in hospital for this.   Dispo: The patient is from: Home              Anticipated d/c is to: Home              Anticipated d/c date is: 2-3 days              Patient currently is not medically stable to d/c.   Family Communication: wife at bedside during encounter   Consults, Procedures, Significant Events   Consultants:   None  Procedures:   Paracentesis 6/28  Antimicrobials:   Meropenem (6/28 to 7/1)  Objective   Vitals:   05/11/20 2324 05/12/20 0432 05/12/20 0800 05/12/20 1745  BP: (!) 138/102 (!) 132/96 (!) 145/101 (!) 150/114  Pulse: (!) 108 97 (!) 106 (!) 102  Resp: 16 16 17 16   Temp: 97.6 F (36.4 C) 97.6 F (36.4 C) 97.7 F (36.5 C) 97.8 F (36.6 C)  TempSrc: Oral Oral Oral   SpO2: 100% 100% 100% 99%  Weight:      Height:        Intake/Output Summary (Last 24 hours) at 05/12/2020 1917 Last data filed at 05/12/2020 1815 Gross per 24 hour  Intake 244.34 ml  Output 500 ml  Net -255.66 ml   Filed Weights   05/03/20 2232  Weight: 63.5 kg    Physical Exam:  General exam: awake, alert, no acute distress, underweight, chronically ill appearing HEENT: Moist mucus membranes, hearing grossly normal, pale lips  Respiratory system: CTAB, normal respiratory effort, no wheezes or rhonchi. Cardiovascular system: normal S1/S2, RRR, trace lower extremity edema.   Gastrointestinal system: distended but not tender, active bowel sounds. Genitourinary: no CVA or suprapubic tenderness, hematuria in Foley bag. Central nervous system: Oriented to self and time, disoriented to place. No gross focal neurologic  deficits, normal speech Extremities: moves all, no cyanosis, normal tone Skin: dry, intact, normal temperature, pale Psychiatric: normal mood, abnormal judgment and insight, no apparent hallucinations  Labs   Data Reviewed: I have personally reviewed following labs and imaging studies  CBC: Recent Labs  Lab 05/07/20 0452 05/08/20 0740 05/10/20 0520 05/11/20 0603 05/12/20 0557  WBC 14.3* 17.2* 18.6* 19.5* 19.4*  NEUTROABS 10.2* 14.4* 15.3* 15.9* 16.4*  HGB 10.9* 11.0* 10.7* 11.1* 10.4*  HCT 30.5* 31.4* 30.1* 31.2* 29.5*  MCV 93.6 94.9 93.5 92.0 92.5  PLT 173 213 205 219 239   Basic Metabolic Panel: Recent Labs  Lab 05/07/20 0452 05/08/20 0740 05/10/20 0520 05/11/20 07/12/20  05/12/20 0557  NA 124* 129* 132* 134* 134*  K 4.5 4.3 3.6 3.4* 3.1*  CL 94* 96* 98 95* 97*  CO2 18* 20* 25 30 27   GLUCOSE 107* 100* 115* 117* 111*  BUN 13 13 9 9 14   CREATININE 0.70 0.79 0.54* 0.60* 0.65  CALCIUM 7.6* 7.5* 7.8* 7.8* 7.8*  MG 1.8 1.7 1.5* 1.7 1.6*  PHOS 3.5 3.1 3.8 3.8 4.3   GFR: Estimated Creatinine Clearance: 116.9 mL/min (by C-G formula based on SCr of 0.65 mg/dL). Liver Function Tests: Recent Labs  Lab 05/07/20 0452 05/08/20 0740 05/10/20 0520 05/11/20 0603 05/12/20 0557  AST 41 54* 63* 50* 31  ALT 22 25 30 27 22   ALKPHOS 61 114 143* 134* 104  BILITOT 2.1* 2.3* 1.0 0.9 1.2  PROT 4.7* 4.6* 4.4* 4.6* 4.5*  ALBUMIN 1.8* 1.8* 1.6* 1.6* 1.6*   Recent Labs  Lab 05/06/20 0318 05/07/20 0452 05/08/20 0740 05/10/20 0520  LIPASE 463* 365* 552* 825*   Recent Labs  Lab 05/09/20 1544 05/12/20 1117  AMMONIA 24 <9*   Coagulation Profile: No results for input(s): INR, PROTIME in the last 168 hours. Cardiac Enzymes: No results for input(s): CKTOTAL, CKMB, CKMBINDEX, TROPONINI in the last 168 hours. BNP (last 3 results) No results for input(s): PROBNP in the last 8760 hours. HbA1C: No results for input(s): HGBA1C in the last 72 hours. CBG: Recent Labs  Lab  05/11/20 1212 05/11/20 1634 05/11/20 2134 05/12/20 0823 05/12/20 1748  GLUCAP 110* 127* 110* 92 114*   Lipid Profile: No results for input(s): CHOL, HDL, LDLCALC, TRIG, CHOLHDL, LDLDIRECT in the last 72 hours. Thyroid Function Tests: Recent Labs    05/12/20 0557 05/12/20 1117  TSH 6.997*  --   FREET4  --  1.10   Anemia Panel: No results for input(s): VITAMINB12, FOLATE, FERRITIN, TIBC, IRON, RETICCTPCT in the last 72 hours. Sepsis Labs: No results for input(s): PROCALCITON, LATICACIDVEN in the last 168 hours.  Recent Results (from the past 240 hour(s))  SARS Coronavirus 2 by RT PCR (hospital order, performed in [facility] hospital lab) Nasopharyngeal Nasopharyngeal Swab     Status: None   Collection Time: 05/03/20  8:25 PM   Specimen: Nasopharyngeal Swab  Result Value Ref Range Status   SARS Coronavirus 2 NEGATIVE NEGATIVE Final    Comment: (NOTE) SARS-CoV-2 target nucleic acids are NOT DETECTED.  The SARS-CoV-2 RNA is generally detectable in upper and lower respiratory specimens during the acute phase of infection. The lowest concentration of SARS-CoV-2 viral copies this assay can detect is 250 copies / mL. A negative result does not preclude SARS-CoV-2 infection and should not be used as the sole basis for treatment or other patient management decisions.  A negative result may occur with improper specimen collection / handling, submission of specimen other than nasopharyngeal swab, presence of viral mutation(s) within the areas targeted by this assay, and inadequate number of viral copies (<250 copies / mL). A negative result must be combined with clinical observations, patient history, and epidemiological information.  Fact Sheet for Patients:   07/13/20  Fact Sheet for Healthcare Providers: [facility]  This test is not yet approved or  cleared by the 05/05/20 FDA and has been authorized for  detection and/or diagnosis of SARS-CoV-2 by FDA under an Emergency Use Authorization (EUA).  This EUA will remain in effect (meaning this test can be used) for the duration of the COVID-19 declaration under Section 564(b)(1) of the Act, 21 U.S.C. section 360bbb-3(b)(1), unless the  authorization is terminated or revoked sooner.  Performed at [facility], [address] [place], [place] [address]   MRSA PCR Screening     Status: None   Collection Time: 05/03/20 11:57 PM   Specimen: Nasopharyngeal  Result Value Ref Range Status   MRSA by PCR NEGATIVE NEGATIVE Final    Comment:        The GeneXpert MRSA Assay (FDA approved for NASAL specimens only), is one component of a comprehensive MRSA colonization surveillance program. It is not intended to diagnose MRSA infection nor to guide or monitor treatment for MRSA infections. Performed at [facility], [address] [place], [place] [address]   Body fluid culture     Status: None   Collection Time: 05/07/20 12:00 PM   Specimen: PATH Cytology Peritoneal fluid  Result Value Ref Range Status   Specimen Description   Final    PERITONEAL Performed at [facility], [address] [place], [place] [address]    Special Requests   Final    PERITONEAL Performed at [facility], [address] [place], [place] [address]    Gram Stain   Final    WBC PRESENT,BOTH PMN AND MONONUCLEAR NO ORGANISMS SEEN CYTOSPIN SMEAR    Culture   Final    NO GROWTH 3 DAYS Performed at [facility], [address] [place], [place] [address]    Report Status 05/11/2020 FINAL  Final  Culture, blood (single) w Reflex to ID Panel     Status: None (Preliminary result)   Collection Time: 05/11/20  7:59 AM   Specimen: BLOOD  Result Value Ref Range Status   Specimen Description BLOOD LEFT WRIST  Final   Special Requests   Final    BOTTLES DRAWN AEROBIC AND ANAEROBIC Blood Culture adequate volume   Culture    Final    NO GROWTH < 24 HOURS Performed at [facility], [address] [place], [place] [address]    Report Status PENDING  Incomplete      Imaging Studies   MR BRAIN WO CONTRAST  Addendum Date: 05/12/2020   ADDENDUM REPORT: 05/12/2020 14:38 ADDENDUM: Study discussed by telephone with Dr. Esaw GrandchildKELLY Marika Mahaffy on 05/12/2020 at 1432 hours. We discussed that CT Venogram may be the modality of choice for follow-up. Electronically Signed   By: Odessa FlemingH  Hall M.D.   On: 05/12/2020 14:38   Result Date: 05/12/2020 CLINICAL DATA:  34 year old male with encephalopathy, lethargy. History of ETOH abuse and abruptly stopped drinking the day before presentation (6/24) and became progressively confused. Was brought to the ED by his wife and while in the ED was witnessed to have a seizure EXAM: MRI HEAD WITHOUT CONTRAST TECHNIQUE: Multiplanar, multiecho pulse sequences of the brain and surrounding structures were obtained without intravenous contrast. COMPARISON:  Head CT 05/03/2020. FINDINGS: The examination had to be discontinued prior to completion by patient request. Only axial FLAIR imaging was not obtained, although the coronal T2 imaging is nondiagnostic due to motion. Brain: There is a 2 cm area of restricted diffusion in the posterior right cerebellum on series 2, image 14 with patchy T2 hyperintensity. No associated hemorrhage or mass effect. No evidence of this on the comparison CT. Furthermore there is gyriform restricted diffusion in the right parietal lobe on series 2, image 31, and a punctate area of cortical restricted diffusion in the left parietal lobe on image 33. There might also be trace restricted diffusion in the medial left occipital pole.  No evidence of associated hemorrhage or intracranial mass effect. Superimposed severely age advanced volume loss of the brainstem and cerebellum (series 6, image 11). No ventriculomegaly. No extra-axial collection. Negative pituitary. Negative cervicomedullary  junction. Vascular: Major intracranial arterial flow voids appear grossly preserved. However, there is an abnormal appearance of the torcula and left transverse sinus on DWI imaging, with a slightly heterogeneous appearance of the sinuses on T2. Skull and upper cervical spine: Negative visible cervical spine and bone marrow signal. Sinuses/Orbits: Negative orbits. Trace paranasal sinus mucosal thickening. Other: Mastoid air cells are clear. Visible internal auditory structures appear normal. IMPRESSION: 1. Mildly motion degraded study which had to be discontinued prior to completion. 2. 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 Head MRV without and with contrast would best evaluate further. Alternatively, CT Venogram (CT head with contrast using venogram protocol) could be pursued if the patient is unable to cooperate with MRI. 3. Associated abnormal diffusion in the parietal lobes, right cerebellum, and possibly also the left occipital pole which may reflect a combination of venous infarcts and postictal changes. 4. No associated hemorrhage or mass effect. 5. Underlying severely age advanced volume loss of the brainstem and cerebellum Electronically Signed: By: Odessa Fleming M.D. On: 05/12/2020 14:29   MR ABDOMEN MRCP W WO CONTAST  Result Date: 05/12/2020 CLINICAL DATA:  Pancreatic cyst/pseudocyst, altered mental status, chronic alcohol abuse, prior abnormal CT EXAM: MRI ABDOMEN WITHOUT AND WITH CONTRAST (INCLUDING MRCP) TECHNIQUE: Multiplanar multisequence MR imaging of the abdomen was performed both before and after the administration of intravenous contrast. Heavily T2-weighted images of the biliary and pancreatic ducts were obtained, and three-dimensional MRCP images were rendered by post processing. CONTRAST:  6mL GADAVIST GADOBUTROL 1 MMOL/ML IV SOLN COMPARISON:  CT abdomen pelvis, 05/04/2020 FINDINGS: Lower chest: Moderate bilateral pleural effusions.  Hepatobiliary: No mass or other parenchymal abnormality identified on noncontrast imaging. No obvious, overt morphologic stigmata of cirrhosis. Gallbladder wall thickening, nonspecific in the setting of ascites. Pancreas: Multiple redemonstrated fluid signal lesions within the pancreatic head and neck, the largest in the pancreatic head adjacent to the distal pancreatic duct measuring 3.8 x 3.3 cm (series 4, image 27). There is no pancreatic ductal dilatation. There may be some adjacent peripancreatic inflammatory stranding, difficult to discern in the setting of large volume ascites. Spleen:  Within normal limits in size and appearance. Adrenals/Urinary Tract: No masses identified. No evidence of hydronephrosis. Stomach/Bowel: Visualized portions within the abdomen are unremarkable. Vascular/Lymphatic: No pathologically enlarged lymph nodes identified. No abdominal aortic aneurysm demonstrated. Other:  Large volume ascites throughout the abdomen. Musculoskeletal: No suspicious bone lesions identified. IMPRESSION: 1. Multiple redemonstrated fluid signal lesions within the pancreatic head and neck, the largest in the pancreatic head adjacent to the distal pancreatic duct measuring 3.8 cm. There may be some adjacent peripancreatic inflammatory stranding, difficult to discern in the setting of large volume ascites. 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 No pancreatic ductal dilatation. 2. No obvious, overt morphologic stigmata of cirrhosis. No evidence of hepatic mass or other parenchymal abnormality on noncontrast imaging. 3. Large volume ascites throughout the abdomen. 4. Moderate bilateral pleural effusions. Electronically Signed   By: Lauralyn Primes M.D.   On: 05/12/2020 14:58   CT VENOGRAM HEAD  Result Date: 05/12/2020 CLINICAL DATA:  Dural venous sinus thrombosis suspected. EXAM: CT VENOGRAM HEAD TECHNIQUE: Postcontrast CT venography of the head was performed  following the administration of 75 mL Omnipaque  350 intravenous contrast. Coronal and sagittal reconstructions were submitted for evaluation. Axial, coronal and sagittal MIP reconstructions were also submitted. CONTRAST:  35mL OMNIPAQUE IOHEXOL 350 MG/ML SOLN COMPARISON:  Brain MRI 05/12/2020. 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 Of note, additional small foci of diffusion weighted abnormality were demonstrated within the parietal lobes on brain MRI performed earlier the same day. There is no acute intracranial hemorrhage. No extra-axial fluid collection. No evidence of intracranial mass. No midline shift. There is a filling defect within the confluence of sinuses and extending into the left transverse dural venous sinus compatible with dural venous sinus thrombosis. The superior sagittal sinus, internal cerebral veins, vein of Galen, straight sinus, right transverse sinus, sigmoid sinuses and visualized jugular veins are patent. No calvarial fracture or suspicious osseous lesion. Visualized orbits show no acute finding. No significant paranasal sinus disease or mastoid effusion at the imaged levels. These results will be called to the ordering clinician or representative by the Radiologist Assistant, and communication documented in the PACS or [employer]. 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 No evidence of hemorrhagic conversion. Of note, small foci of restricted diffusion were demonstrated within the bilateral parietal lobes on same-day brain MRI. Please refer to this prior examination for further description. Electronically Signed   By: Jackey Loge DO   On: 05/12/2020 17:38     Medications   Scheduled Meds: . aspirin EC  81 mg Oral Daily  . atorvastatin  40 mg Oral Daily  . carvedilol  12.5  mg Oral BID WC  . Chlorhexidine Gluconate Cloth  6 each Topical Daily  . feeding supplement (ENSURE ENLIVE)  237 mL Oral TID BM  . folic acid  1 mg Oral Daily  . furosemide  40 mg Oral Daily  . multivitamin with minerals  1 tablet Oral Daily  . naltrexone  50 mg Oral Daily  . pantoprazole  40 mg Oral BID AC  . saccharomyces boulardii  250 mg Oral BID  . sodium chloride flush  3 mL Intravenous Q12H  . spironolactone  25 mg Oral Daily  . thiamine  100 mg Oral Daily   Or  . thiamine  100 mg Intravenous Daily   Continuous Infusions: . sodium chloride Stopped (05/12/20 1049)       LOS: 9 days    Time spent: 45 minutes with >50% spent in coordination of care and direct patient contact    Pennie Banter, DO [facility]  05/12/2020, 7:17 PM    If 7PM-7AM, please contact night-coverage. How to contact the [facility] Attending or Consulting provider 7A - 7P or covering provider during after hours 7P -7A, for this patient?    1. Check the care team in [facility] and look for a) attending/consulting [facility] provider listed and b) the [facility] team listed 2. Log into [email or web address] and use [facility] universal password to access. If you do not have the password, please contact the hospital operator. 3. Locate the [facility] provider you are looking for under [facility] and page to a number that you can be directly reached. 4. If you still have difficulty reaching the provider, please page the [facility] (Director on Call) for the Hospitalists listed on amion for assistance.

## 2020-05-13 LAB — CBC WITH DIFFERENTIAL/PLATELET
Abs Immature Granulocytes: 0.56 10*3/uL — ABNORMAL HIGH (ref 0.00–0.07)
Basophils Absolute: 0.2 10*3/uL — ABNORMAL HIGH (ref 0.0–0.1)
Basophils Relative: 1 %
Eosinophils Absolute: 0.2 10*3/uL (ref 0.0–0.5)
Eosinophils Relative: 1 %
HCT: 30.3 % — ABNORMAL LOW (ref 39.0–52.0)
Hemoglobin: 10.5 g/dL — ABNORMAL LOW (ref 13.0–17.0)
Immature Granulocytes: 3 %
Lymphocytes Relative: 4 %
Lymphs Abs: 0.8 10*3/uL (ref 0.7–4.0)
MCH: 32.7 pg (ref 26.0–34.0)
MCHC: 34.7 g/dL (ref 30.0–36.0)
MCV: 94.4 fL (ref 80.0–100.0)
Monocytes Absolute: 1.1 10*3/uL — ABNORMAL HIGH (ref 0.1–1.0)
Monocytes Relative: 5 %
Neutro Abs: 17.5 10*3/uL — ABNORMAL HIGH (ref 1.7–7.7)
Neutrophils Relative %: 86 %
Platelets: 246 10*3/uL (ref 150–400)
RBC: 3.21 MIL/uL — ABNORMAL LOW (ref 4.22–5.81)
RDW: 12.4 % (ref 11.5–15.5)
Smear Review: NORMAL
WBC: 20.4 10*3/uL — ABNORMAL HIGH (ref 4.0–10.5)
nRBC: 0 % (ref 0.0–0.2)

## 2020-05-13 LAB — LIPASE, BLOOD: Lipase: 1543 U/L — ABNORMAL HIGH (ref 11–51)

## 2020-05-13 LAB — GLUCOSE, CAPILLARY
Glucose-Capillary: 109 mg/dL — ABNORMAL HIGH (ref 70–99)
Glucose-Capillary: 115 mg/dL — ABNORMAL HIGH (ref 70–99)
Glucose-Capillary: 110 mg/dL — ABNORMAL HIGH (ref 70–99)
Glucose-Capillary: 127 mg/dL — ABNORMAL HIGH (ref 70–99)

## 2020-05-13 LAB — COMPREHENSIVE METABOLIC PANEL
ALT: 24 U/L (ref 0–44)
AST: 34 U/L (ref 15–41)
Albumin: 1.7 g/dL — ABNORMAL LOW (ref 3.5–5.0)
Alkaline Phosphatase: 95 U/L (ref 38–126)
Anion gap: 8 (ref 5–15)
BUN: 16 mg/dL (ref 6–20)
CO2: 26 mmol/L (ref 22–32)
Calcium: 7.9 mg/dL — ABNORMAL LOW (ref 8.9–10.3)
Chloride: 101 mmol/L (ref 98–111)
Creatinine, Ser: 0.56 mg/dL — ABNORMAL LOW (ref 0.61–1.24)
GFR calc Af Amer: >60 mL/min (ref >60)
GFR calc non Af Amer: >60 mL/min (ref >60)
Glucose, Bld: 113 mg/dL — ABNORMAL HIGH (ref 70–99)
Potassium: 3.8 mmol/L (ref 3.5–5.1)
Sodium: 135 mmol/L (ref 135–145)
Total Bilirubin: 0.8 mg/dL (ref 0.3–1.2)
Total Protein: 4.7 g/dL — ABNORMAL LOW (ref 6.5–8.1)

## 2020-05-13 LAB — PROTIME-INR
INR: 1.1 (ref 0.8–1.2)
Prothrombin Time: 13.8 seconds (ref 11.4–15.2)

## 2020-05-13 LAB — APTT: aPTT: 30 seconds (ref 24–36)

## 2020-05-13 LAB — HEPARIN LEVEL (UNFRACTIONATED): Heparin Unfractionated: <0.1 IU/mL — ABNORMAL LOW (ref 0.30–0.70)

## 2020-05-13 LAB — MAGNESIUM: Magnesium: 1.7 mg/dL (ref 1.7–2.4)

## 2020-05-13 LAB — PHOSPHORUS: Phosphorus: 4.3 mg/dL (ref 2.5–4.6)

## 2020-05-13 MED ORDER — HEPARIN (PORCINE) 25000 UT/250ML-% IV SOLN
1850.0000 [IU]/h | INTRAVENOUS | Status: AC
  Administered 2020-05-13: 11:00:00 800 [IU]/h via INTRAVENOUS
  Administered 2020-05-14: 1000 [IU]/h via INTRAVENOUS
  Administered 2020-05-15: 900 [IU]/h via INTRAVENOUS
  Administered 2020-05-16: 1150 [IU]/h via INTRAVENOUS
  Administered 2020-05-17: 1350 [IU]/h via INTRAVENOUS
  Administered 2020-05-18: 1550 [IU]/h via INTRAVENOUS
  Administered 2020-05-20 – 2020-05-21 (×2): 1750 [IU]/h via INTRAVENOUS
  Administered 2020-05-22: 1850 [IU]/h via INTRAVENOUS
  Filled 2020-05-13 (×12): qty 250

## 2020-05-13 MED ORDER — PIPERACILLIN-TAZOBACTAM 3.375 G IVPB
3.3750 g | Freq: Three times a day (TID) | INTRAVENOUS | Status: DC
  Administered 2020-05-13 – 2020-05-16 (×8): 3.375 g via INTRAVENOUS
  Filled 2020-05-13 (×11): qty 50

## 2020-05-13 MED ORDER — DEXTROSE IN LACTATED RINGERS 5 % IV SOLN: INTRAVENOUS | Status: DC

## 2020-05-13 MED ORDER — MAGNESIUM SULFATE 2 GM/50ML IV SOLN
2.0000 g | Freq: Once | INTRAVENOUS | Status: AC
  Administered 2020-05-13: 2 g via INTRAVENOUS
  Filled 2020-05-13: qty 50

## 2020-05-13 MED ORDER — HEPARIN BOLUS VIA INFUSION
1900.0000 [IU] | Freq: Once | INTRAVENOUS | Status: DC
  Filled 2020-05-13: qty 1900

## 2020-05-13 NOTE — Progress Notes (Signed)
Patient is refusing to wear his Tele leads. He has been educated on the importance of wearing them but still refuses.Provider on-call notified.

## 2020-05-13 NOTE — Progress Notes (Addendum)
PROGRESS NOTE    Shane Betty Sr.   ZOX:[id number]  DOB: September 02, 1986  PCP: [name], MD    DOA: 05/03/2020 LOS: 10   Brief Narrative   34 year old gentleman with no medical history, chronic alcoholism, has been drinking nonstop about 12 packs of beer daily since age of 62 presented to the ED on  05/03/2020 for evaluation of progressive confusion and lethargy.  He also had episode of unresponsiveness in the triage area.  As per patient's fianc, patient stopped drinking about 24 hours prior to arrival and started behaving altered, confused.  At the same time, patient has multiple episodes of loose watery stool, some of them dark appearing.  No nausea or vomiting.  In the ED, he was ill-appearing, BP stable, tachycardic with heart rate 141, respirations 21, and afebrile.  Labs were notable for sodium 124, potassium was 2.8, creatinine 1.75, bilirubin 2.  Anion gap 19.  CK 700.  Chest x-ray was essentially normal.  CT head was normal.    CT abdomen/pelvis showed:  Edema adjacent to head and body of pancreas question pancreatitis (recommend correlation with serum lipase/amylase levels), cystic lesions at the pancreas, largest at head 4.4 cm diameter, could represent pseudocysts in a patient with pancreatitis or cystic pancreatic neoplasms; recommend attention on follow-up imaging to ensure resolution, or may characterize by MR, moderate ascites with mild fatty  infiltration of liver, small BILATERAL pleural effusions and minimal bibasilar atelectasis (other non-pertinent findings in report).   Admitted to the hospitalist service for further evaluation and management on acute alcohol withdrawal syndrome, acute pancreatitis and possible SBP (ruled out).     Patient also with abdominal pain and diarrhea and worsening distention.  General surgery was consulted.  Intra-abdominal pressure monitoring < 20 and stable hemodynamics, no end organ damage.  Paracentesis on 6/28 yielded 2.8 L ascitic fluid  with elevated neutrophils.  Started on meropenem pending fluid cultures, this was stopped after no growth on cultures at 48 hours.  Abdominal distention remains stable and no tenderness.      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  Neurology following.  Heparin infusion.  Neuro-checks.   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  ASA and statin started.  Neurology following.  Generalized weakness - due to chronic alcoholism and malnutrition as well as prolonged acute illness.  He is requiring heavy assistance with bed mobility and standing.   PT recommends SNF.  TOC consulted.    Acute metabolic encephalopathy - improving, still waxes and wanes. Ongoing periods of confusion.  No longer receiving benzos for alcohol withdrawal.  CT head upon admission was negative for acute findings that showed generalized atrophy advanced for his age.  Exam is non-focal.  Ammonia level normal.  B12 normal.  UDS on admission was never done, please collect and process.  Neurology consulted, appreciate input.  EEG pending.  Follow up thiamine levels.   Mild TSH elevation but normal T4.  Repeat ammonia level 7/3 was <9.    Alcohol withdrawal syndrome - present on admission Alcohol dependence / Alcohol use disorder Completed Librium taper.  Stop CIWA protocol.  Continue thiamine and folic acid.  Patient agreeable to medication for AUD, to reduce cravings and assist in maintaining his  sobriety, continue on naltrexone 50 mg PO daily (liver function okay and only mild fatty infiltration on imaging).  Recommend inpatient rehab if patient amenable, however he is too weak to ambulate, would need to be stronger and independent.  Advise intensive psychosocial support  after discharge.  Acute alcoholic pancreatitis -present on admission with abdominal pain, tachycardia,  diarrhea, hypotension.  No gallbladder abnormalities on CT abdomen.  Lipase continues to rise, today 1543. Continue supportive care with IV hydration, pain control.  Advance diet to soft, further advance as tolerated. Off empiric meropenem given negative ascites cultures.  Monitor abdominal exam.   With worsening abdominal distention, surgery was consulted.  Intra-abdominal pressure monitoring showed intra-abdominal pressure less than 20 with no hemodynamic instability.  No endorgan damage.  Surgery recommends management of ascites and pancreatitis.  Distention improving with diuretics.  Started empiric Zosyn in case of developing infection.  Severe Protein Calorie Malnutrition - POA, due to chronic alcohol abuse and not eating regular food.  Minimal PO intake during admission, but diet started on 7/3.  Patient tolerating but not taking in enough calories yet.  Dietician consulted.  May require tube feeding soon, recommendations have been made.  Daily CMP, Mg, Phos.  High risk of refeeding syndrome.    Hematuria - new 7/3, in addition to pt reporting dysuria.  UA with reflex is pending.  Diarrhea - present on admission, persistent.  No fevers or leukocytosis, very unlikely infectious.  Suspect malabsorption due to pancreatitis.  Imodium PRN.  Leukocytosis - likely due to pancreatitis, no other evidence of infection.  UA negative.  No respiratory symptoms.  Monitor closely.  Did start empiric Zosyn today (7/4) given persistent high wbc, concern for developing pancreatic infection or SBP.  Follow blood cultures, no growth so far.  Sinus tachycardia - persistent, likely due to alcohol withdrawal and pain from pancreatitis.  No evidence of infection / sepsis.  Ascites fluid cultures have been negative.  Started low dose of Coreg for BP and HR control.  Continue to manage underlying issues as above.   Will check blood cultures.  No respiratory or urinary symptoms, no fevers to suggest infection.  Ascites - his liver has mild fatty infiltration on imaging, not cirrhotic at this point, this is likely due to pancreatitis and hypoalbuminemia.  Underwent paracentesis on 6/28 with 2.8 L fluid removed. Ordered repeat paracentesis (7/1) due to increased distention, but not enough fluid seen on ultrasound to do safely.  Continue Lasix and spironolactone.  Large volume ascites seen on MRI abdomen, so will try US paracentesis again, ordered for Monday.  Re-culture ascites fluid to r/o SBP.  Elevated TSH - free T4 normal.    Chest pain - Resolved.  Reported by pt on AM of 7/1.  Troponin mildly elevated at 32, likely demand ischemia from persistent tachycardia in setting of alcohol withdrawal.  No acute ischemic changes on EKG, only non-specific T wave changes.  Monitor.  Hypervolemic hyponatremia -POA with sodium 124.   Suspect due to liver disease.  Monitor BMP.  Improving with diuretics.  Na 134.  Acute renal failure -present on admission with creatinine 1.75.  Likely prerenal azotemia in the setting of above.  Improved with IV hydration.  Monitor BMP and urine output.  Hypokalemia -present on admission.  Replaced with improvement.    Monitor BMP, mag and Foss    DVT prophylaxis: SCDs Start: 05/03/20 2153   Diet:  Diet Orders (From admission, onward)    Start  Ordered   05/11/20 1118  DIET SOFT Room service appropriate? Yes; Fluid consistency: Thin  Diet effective now       Question Answer Comment  Room service appropriate? Yes   Fluid consistency: Thin      05/11/20 1118            Code Status: Full Code    Subjective 05/13/20    Patient seen at bedside with wife at bedside.  She reports he has been confused again this AM, thought she was someone else.  Patient denies complaints including abdominal pain, nausea, vomiting, fevers or chills.  Wife reports he is not eating and  drinking very little.  We discussed proceeding with feeding tube and they are in agreement.    Disposition Plan & Communication   Status is: Inpatient  Remains inpatient appropriate because:Inpatient level of care appropriate due to severity of illness.  With recurrent encephalopathy.  Profound generalized weakness and inability to stand or ambulate.  J tube placement pending.     Dispo: The patient is from: Home              Anticipated d/c is to: Home              Anticipated d/c date is: > 3 days              Patient currently is not medically stable to d/c.   Family Communication: wife at bedside during encounter   Consults, Procedures, Significant Events   Consultants:   None  Procedures:   Paracentesis 6/28  Antimicrobials:   Meropenem (6/28 to 7/1)  Objective   Vitals:   05/12/20 0800 05/12/20 1745 05/12/20 1954 05/13/20 0746  BP: (!) 145/101 (!) 150/114 (!) 144/101 115/83  Pulse: (!) 106 (!) 102 (!) 103 (!) 106  Resp: 17 16 16 18   Temp: 97.7 F (36.5 C) 97.8 F (36.6 C) 98.2 F (36.8 C) 97.7 F (36.5 C)  TempSrc: Oral   Oral  SpO2: 100% 99% 100% 100%  Weight:      Height:        Intake/Output Summary (Last 24 hours) at 05/13/2020 0753 Last data filed at 05/12/2020 1815 Gross per 24 hour  Intake 244.34 ml  Output 300 ml  Net -55.66 ml   Filed Weights   05/03/20 2232  Weight: 63.5 kg    Physical Exam:  General exam: awake, alert, no acute distress, underweight, chronically ill appearing HEENT: dry mucus membranes, hearing grossly normal, pale lips  Respiratory system: CTAB, normal respiratory effort, no wheezes or rhonchi. Cardiovascular system: normal S1/S2, RRR, trace lower extremity edema.   Gastrointestinal system: little less distended today, not tender, active bowel sounds. Central nervous system: Oriented to self and time, disoriented to place. No gross focal neurologic deficits, normal speech Extremities: moves all, no cyanosis, normal  tone Skin: dry, intact, normal temperature, pale Psychiatric: normal mood, abnormal judgment and insight, no apparent hallucinations  Labs   Data Reviewed: I have personally reviewed following labs and imaging studies  CBC: Recent Labs  Lab 05/08/20 0740 05/10/20 0520 05/11/20 0603 05/12/20 0557 05/13/20 0618  WBC 17.2* 18.6* 19.5* 19.4* 20.4*  NEUTROABS 14.4* 15.3* 15.9* 16.4* 17.5*  HGB 11.0* 10.7* 11.1* 10.4* 10.5*  HCT 31.4* 30.1* 31.2* 29.5* 30.3*  MCV 94.9 93.5 92.0 92.5 94.4  PLT 213 205 219 239 246   Basic Metabolic Panel: Recent Labs  Lab 05/07/20 0452 05/07/20 0452 05/08/20 0740 05/10/20 0520 05/11/20 0603 05/12/20 0557 05/13/20 07/14/20  NA 124*   < > 129* 132* 134* 134* 135  K 4.5   < > 4.3 3.6 3.4* 3.1* 3.8  CL 94*   < > 96* 98 95* 97* 101  CO2 18*   < > 20* 25 30 27 26   GLUCOSE 107*   < > 100* 115* 117* 111* 113*  BUN 13   < > 13 9 9 14 16   CREATININE 0.70   < > 0.79 0.54* 0.60* 0.65 0.56*  CALCIUM 7.6*   < > 7.5* 7.8* 7.8* 7.8* 7.9*  MG 1.8   < > 1.7 1.5* 1.7 1.6* 1.7  PHOS 3.5  --  3.1 3.8 3.8 4.3  --    < > = values in this interval not displayed.   GFR: Estimated Creatinine Clearance: 116.9 mL/min (A) (by C-G formula based on SCr of 0.56 mg/dL (L)). Liver Function Tests: Recent Labs  Lab 05/08/20 0740 05/10/20 0520 05/11/20 0603 05/12/20 0557 05/13/20 0618  AST 54* 63* 50* 31 34  ALT 25 30 27 22 24   ALKPHOS 114 143* 134* 104 95  BILITOT 2.3* 1.0 0.9 1.2 0.8  PROT 4.6* 4.4* 4.6* 4.5* 4.7*  ALBUMIN 1.8* 1.6* 1.6* 1.6* 1.7*   Recent Labs  Lab 05/07/20 0452 05/08/20 0740 05/10/20 0520  LIPASE 365* 552* 825*   Recent Labs  Lab 05/09/20 1544 05/12/20 1117  AMMONIA 24 <9*   Coagulation Profile: No results for input(s): INR, PROTIME in the last 168 hours. Cardiac Enzymes: No results for input(s): CKTOTAL, CKMB, CKMBINDEX, TROPONINI in the last 168 hours. BNP (last 3 results) No results for input(s): PROBNP in the last 8760  hours. HbA1C: No results for input(s): HGBA1C in the last 72 hours. CBG: Recent Labs  Lab 05/11/20 2134 05/12/20 0823 05/12/20 1748 05/12/20 2137 05/13/20 0749  GLUCAP 110* 92 114* 101* 109*   Lipid Profile: No results for input(s): CHOL, HDL, LDLCALC, TRIG, CHOLHDL, LDLDIRECT in the last 72 hours. Thyroid Function Tests: Recent Labs    05/12/20 0557 05/12/20 1117  TSH 6.997*  --   FREET4  --  1.10   Anemia Panel: No results for input(s): VITAMINB12, FOLATE, FERRITIN, TIBC, IRON, RETICCTPCT in the last 72 hours. Sepsis Labs: No results for input(s): PROCALCITON, LATICACIDVEN in the last 168 hours.  Recent Results (from the past 240 hour(s))  SARS Coronavirus 2 by RT PCR (hospital order, performed [facility]) Nasopharyngeal Nasopharyngeal Swab     Status: None   Collection Time: 05/03/20  8:25 PM   Specimen: Nasopharyngeal Swab  Result Value Ref Range Status   SARS Coronavirus 2 NEGATIVE NEGATIVE Final    Comment: (NOTE) SARS-CoV-2 target nucleic acids are NOT DETECTED.  The SARS-CoV-2 RNA is generally detectable in upper and lower respiratory specimens during the acute phase of infection. The lowest concentration of SARS-CoV-2 viral copies this assay can detect is 250 copies / mL. A negative result does not preclude SARS-CoV-2 infection and should not be used as the sole basis for treatment or other patient management decisions.  A negative result may occur with improper specimen collection / handling, submission of specimen other than nasopharyngeal swab, presence of viral mutation(s) within the areas targeted by this assay, and inadequate number of viral copies (<250 copies / mL). A negative result must be combined with clinical observations, patient history, and epidemiological information.  Fact Sheet for Patients:   BoilerBrush.com.cyhttps://www.fda.gov/media/136312/download  Fact Sheet for Healthcare  Providers: https://pope.com/https://www.fda.gov/media/136313/download  This test is not yet approved  or  cleared by the [place] and has been authorized for detection and/or diagnosis of SARS-CoV-2 by FDA under an Emergency Use Authorization (EUA).  This EUA will remain in effect (meaning this test can be used) for the duration of the COVID-19 declaration under Section 564(b)(1) of the Act, 21 U.S.C. section 360bbb-3(b)(1), unless the authorization is terminated or revoked sooner.  Performed at [facility], [address] [place], [place] [address]   MRSA PCR Screening     Status: None   Collection Time: 05/03/20 11:57 PM   Specimen: Nasopharyngeal  Result Value Ref Range Status   MRSA by PCR NEGATIVE NEGATIVE Final    Comment:        The GeneXpert MRSA Assay (FDA approved for NASAL specimens only), is one component of a comprehensive MRSA colonization surveillance program. It is not intended to diagnose MRSA infection nor to guide or monitor treatment for MRSA infections. Performed at [facility], [address] [place], [place] [address]   Body fluid culture     Status: None   Collection Time: 05/07/20 12:00 PM   Specimen: PATH Cytology Peritoneal fluid  Result Value Ref Range Status   Specimen Description   Final    PERITONEAL Performed at [facility], [address] [place], [place] [address]    Special Requests   Final    PERITONEAL Performed at [facility], [address] [place], [place] [address]    Gram Stain   Final    WBC PRESENT,BOTH PMN AND MONONUCLEAR NO ORGANISMS SEEN CYTOSPIN SMEAR    Culture   Final    NO GROWTH 3 DAYS Performed at [facility], [address] [place], [place] [address]    Report Status 05/11/2020 FINAL  Final  Culture, blood (single) w Reflex to ID Panel     Status: None (Preliminary result)   Collection Time: 05/11/20  7:59 AM   Specimen: BLOOD  Result Value Ref Range Status    Specimen Description BLOOD LEFT WRIST  Final   Special Requests   Final    BOTTLES DRAWN AEROBIC AND ANAEROBIC Blood Culture adequate volume   Culture   Final    NO GROWTH 2 DAYS Performed at [facility], [address], [place], [place] [address]    Report Status PENDING  Incomplete      Imaging Studies   MR BRAIN WO CONTRAST  Addendum Date: 05/12/2020   ADDENDUM REPORT: 05/12/2020 14:38 ADDENDUM: Study discussed by telephone with Dr. Esaw Grandchild on 05/12/2020 at 1432 hours. We discussed that CT Venogram may be the modality of choice for follow-up. Electronically Signed   By: Odessa Fleming M.D.   On: 05/12/2020 14:38   Result Date: 05/12/2020 CLINICAL DATA:  34 year old male with encephalopathy, lethargy. History of ETOH abuse and abruptly stopped drinking the day before presentation (6/24) and became progressively confused. Was brought to the ED by his wife and while in the ED was witnessed to have a seizure EXAM: MRI HEAD WITHOUT CONTRAST TECHNIQUE: Multiplanar, multiecho pulse sequences of the brain and surrounding structures were obtained without intravenous contrast. COMPARISON:  Head CT 05/03/2020. FINDINGS: The examination had to be discontinued prior to completion by patient request. Only axial FLAIR imaging was not obtained, although the coronal T2 imaging is nondiagnostic due to motion. Brain: There is a 2 cm area of restricted diffusion in the posterior right cerebellum on series 2, image 14 with patchy T2 hyperintensity. No associated  hemorrhage or mass effect. No evidence of this on the comparison CT. Furthermore there is gyriform restricted diffusion in the right parietal lobe on series 2, image 31, and a punctate area of cortical restricted diffusion in the left parietal lobe on image 33. There might also be trace restricted diffusion in the medial left occipital pole. No evidence of associated hemorrhage or intracranial mass effect. Superimposed severely age advanced volume loss  of the brainstem and cerebellum (series 6, image 11). No ventriculomegaly. No extra-axial collection. Negative pituitary. Negative cervicomedullary junction. Vascular: Major intracranial arterial flow voids appear grossly preserved. However, there is an abnormal appearance of the torcula and left transverse sinus on DWI imaging, with a slightly heterogeneous appearance of the sinuses on T2. Skull and upper cervical spine: Negative visible cervical spine and bone marrow signal. Sinuses/Orbits: Negative orbits. Trace paranasal sinus mucosal thickening. Other: Mastoid air cells are clear. Visible internal auditory structures appear normal. IMPRESSION: 1. Mildly motion degraded study which had to be discontinued prior to completion. 2. 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 Head MRV without and with contrast would best evaluate further. Alternatively, CT Venogram (CT head with contrast using venogram protocol) could be pursued if the patient is unable to cooperate with MRI. 3. Associated abnormal diffusion in the parietal lobes, right cerebellum, and possibly also the left occipital pole which may reflect a combination of venous infarcts and postictal changes. 4. No associated hemorrhage or mass effect. 5. Underlying severely age advanced volume loss of the brainstem and cerebellum Electronically Signed: By: Odessa Fleming M.D. On: 05/12/2020 14:29   MR ABDOMEN MRCP W WO CONTAST  Result Date: 05/12/2020 CLINICAL DATA:  Pancreatic cyst/pseudocyst, altered mental status, chronic alcohol abuse, prior abnormal CT EXAM: MRI ABDOMEN WITHOUT AND WITH CONTRAST (INCLUDING MRCP) TECHNIQUE: Multiplanar multisequence MR imaging of the abdomen was performed both before and after the administration of intravenous contrast. Heavily T2-weighted images of the biliary and pancreatic ducts were obtained, and three-dimensional MRCP images were rendered by post processing. CONTRAST:   6mL GADAVIST GADOBUTROL 1 MMOL/ML IV SOLN COMPARISON:  CT abdomen pelvis, 05/04/2020 FINDINGS: Lower chest: Moderate bilateral pleural effusions. Hepatobiliary: No mass or other parenchymal abnormality identified on noncontrast imaging. No obvious, overt morphologic stigmata of cirrhosis. Gallbladder wall thickening, nonspecific in the setting of ascites. Pancreas: Multiple redemonstrated fluid signal lesions within the pancreatic head and neck, the largest in the pancreatic head adjacent to the distal pancreatic duct measuring 3.8 x 3.3 cm (series 4, image 27). There is no pancreatic ductal dilatation. There may be some adjacent peripancreatic inflammatory stranding, difficult to discern in the setting of large volume ascites. Spleen:  Within normal limits in size and appearance. Adrenals/Urinary Tract: No masses identified. No evidence of hydronephrosis. Stomach/Bowel: Visualized portions within the abdomen are unremarkable. Vascular/Lymphatic: No pathologically enlarged lymph nodes identified. No abdominal aortic aneurysm demonstrated. Other:  Large volume ascites throughout the abdomen. Musculoskeletal: No suspicious bone lesions identified. IMPRESSION: 1. Multiple redemonstrated fluid signal lesions within the pancreatic head and neck, the largest in the pancreatic head adjacent to the distal pancreatic duct measuring 3.8 cm. There may be some adjacent peripancreatic inflammatory stranding, difficult to discern in the setting of large volume ascites. 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 No pancreatic ductal dilatation. 2. No obvious, overt morphologic stigmata of cirrhosis. No evidence of hepatic mass or other parenchymal abnormality on noncontrast imaging. 3. Large volume ascites throughout the abdomen.  4. Moderate bilateral pleural effusions. Electronically Signed   By: Lauralyn Primes M.D.   On: 05/12/2020 14:58   CT VENOGRAM HEAD  Result Date:  05/12/2020 CLINICAL DATA:  Dural venous sinus thrombosis suspected. EXAM: CT VENOGRAM HEAD TECHNIQUE: Postcontrast CT venography of the head was performed following the administration of 75 mL Omnipaque 350 intravenous contrast. Coronal and sagittal reconstructions were submitted for evaluation. Axial, coronal and sagittal MIP reconstructions were also submitted. CONTRAST:  75mL OMNIPAQUE IOHEXOL 350 MG/ML SOLN COMPARISON:  Brain MRI 05/12/2020. 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 Of note, additional small foci of diffusion weighted abnormality were demonstrated within the parietal lobes on brain MRI performed earlier the same day. There is no acute intracranial hemorrhage. No extra-axial fluid collection. No evidence of intracranial mass. No midline shift. There is a filling defect within the confluence of sinuses and extending into the left transverse dural venous sinus compatible with dural venous sinus thrombosis. The superior sagittal sinus, internal cerebral veins, vein of Galen, straight sinus, right transverse sinus, sigmoid sinuses and visualized jugular veins are patent. No calvarial fracture or suspicious osseous lesion. Visualized orbits show no acute finding. No significant paranasal sinus disease or mastoid effusion at the imaged levels. These results will be called to the ordering clinician or representative by the Radiologist Assistant, and communication documented in the PACS or [employer]. 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 No evidence of hemorrhagic conversion. Of note, small foci of restricted diffusion were demonstrated within the bilateral parietal lobes on same-day brain MRI. Please refer to this prior examination for further description. Electronically Signed   By: Jackey Loge  DO   On: 05/12/2020 17:38     Medications   Scheduled Meds: . aspirin EC  81 mg Oral Daily  . atorvastatin  40 mg Oral Daily  . carvedilol  12.5 mg Oral BID WC  . Chlorhexidine Gluconate Cloth  6 each Topical Daily  . feeding supplement (ENSURE ENLIVE)  237 mL Oral TID BM  . folic acid  1 mg Oral Daily  . furosemide  40 mg Oral Daily  . multivitamin with minerals  1 tablet Oral Daily  . naltrexone  50 mg Oral Daily  . pantoprazole  40 mg Oral BID AC  . saccharomyces boulardii  250 mg Oral BID  . sodium chloride flush  3 mL Intravenous Q12H  . spironolactone  25 mg Oral Daily  . thiamine  100 mg Oral Daily   Or  . thiamine  100 mg Intravenous Daily   Continuous Infusions: . sodium chloride Stopped (05/12/20 1049)       LOS: 10 days    Time spent: 30 minutes    Pennie Banter, DO [facility]  05/13/2020, 7:53 AM    If 7PM-7AM, please contact night-coverage. How to contact the [facility] Attending or Consulting provider 7A - 7P or covering provider during after hours 7P -7A, for this patient?    1. Check the care team in [facility] and look for a) attending/consulting TRH provider listed and b) the [facility] team listed 2. Log into [email or web address] and use [place]'s universal password to access. If you do not have the password, please contact the hospital operator. 3. Locate the [facility] provider you are looking for under [facility] and page to a number that you can be directly reached.  4. If you still have difficulty reaching the provider, please page the [facility] (Director on Call) for the Hospitalists listed on amion for assistance.

## 2020-05-13 NOTE — Progress Notes (Signed)
Subjective: Mental status improved.  Patient awake and alert.  Follows commands  Objective: Current vital signs: BP 115/83 (BP Location: Left Arm)   Pulse (!) 106   Temp 97.7 F (36.5 C) (Oral)   Resp 18   Ht 6\' 1"  (1.854 m)   Wt 63.5 kg   SpO2 100%   BMI 18.47 kg/m  Vital signs in last 24 hours: Temp:  [97.7 F (36.5 C)-98.2 F (36.8 C)] 97.7 F (36.5 C) (07/04 0746) Pulse Rate:  [102-106] 106 (07/04 0746) Resp:  [16-18] 18 (07/04 0746) BP: (115-150)/(83-114) 115/83 (07/04 0746) SpO2:  [99 %-100 %] 100 % (07/04 0746)  Intake/Output from previous day: 07/03 0701 - 07/04 0700 In: 244.3 [P.O.:240; I.V.:4.3] Out: 300 [Urine:300] Intake/Output this shift: No intake/output data recorded. Nutritional status:  Diet Order            DIET SOFT Room service appropriate? Yes; Fluid consistency: Thin  Diet effective now                 Neurologic Exam: Mental Status: Alert, oriented to name and place.  Speech fluent without evidence of aphasia.  Dysarthric.  Able to follow commands. Cranial Nerves: II: Blinks to bilateral confrontation III,IV, VI: Extra-ocular motions intact bilaterally V,VII: smile symmetric VIII: hearing normal bilaterally IX,X: gag reflex present XI: bilateral shoulder shrug XII: midline tongue extension Motor: Moves all extremities strongly against gravity Sensory: Pinprick and light touch intact throughout, bilaterally  Lab Results: Basic Metabolic Panel: Recent Labs  Lab 05/08/20 0740 05/08/20 0740 05/10/20 0520 05/10/20 0520 05/11/20 0603 05/12/20 0557 05/13/20 0618  NA 129*  --  132*  --  134* 134* 135  K 4.3  --  3.6  --  3.4* 3.1* 3.8  CL 96*  --  98  --  95* 97* 101  CO2 20*  --  25  --  30 27 26   GLUCOSE 100*  --  115*  --  117* 111* 113*  BUN 13  --  9  --  9 14 16   CREATININE 0.79  --  0.54*  --  0.60* 0.65 0.56*  CALCIUM 7.5*   < > 7.8*   < > 7.8* 7.8* 7.9*  MG 1.7  --  1.5*  --  1.7 1.6* 1.7  PHOS 3.1  --  3.8  --  3.8  4.3 4.3   < > = values in this interval not displayed.    Liver Function Tests: Recent Labs  Lab 05/08/20 0740 05/10/20 0520 05/11/20 0603 05/12/20 0557 05/13/20 0618  AST 54* 63* 50* 31 34  ALT 25 30 27 22 24   ALKPHOS 114 143* 134* 104 95  BILITOT 2.3* 1.0 0.9 1.2 0.8  PROT 4.6* 4.4* 4.6* 4.5* 4.7*  ALBUMIN 1.8* 1.6* 1.6* 1.6* 1.7*   Recent Labs  Lab 05/07/20 0452 05/08/20 0740 05/10/20 0520 05/13/20 0618  LIPASE 365* 552* 825* 1,543*   Recent Labs  Lab 05/09/20 1544 05/12/20 1117  AMMONIA 24 <9*    CBC: Recent Labs  Lab 05/08/20 0740 05/10/20 0520 05/11/20 0603 05/12/20 0557 05/13/20 0618  WBC 17.2* 18.6* 19.5* 19.4* 20.4*  NEUTROABS 14.4* 15.3* 15.9* 16.4* 17.5*  HGB 11.0* 10.7* 11.1* 10.4* 10.5*  HCT 31.4* 30.1* 31.2* 29.5* 30.3*  MCV 94.9 93.5 92.0 92.5 94.4  PLT 213 205 219 239 246    Cardiac Enzymes: No results for input(s): CKTOTAL, CKMB, CKMBINDEX, TROPONINI in the last 168 hours.  Lipid Panel: No results for input(s): CHOL,  TRIG, HDL, CHOLHDL, VLDL, LDLCALC in the last 168 hours.  CBG: Recent Labs  Lab 05/11/20 2134 05/12/20 0823 05/12/20 1748 05/12/20 2137 05/13/20 0749  GLUCAP 110* 92 114* 101* 109*    Microbiology: Results for orders placed or performed during the hospital encounter of 05/03/20  SARS Coronavirus 2 by RT PCR (hospital order, performed in [facility] hospital lab) Nasopharyngeal Nasopharyngeal Swab     Status: None   Collection Time: 05/03/20  8:25 PM   Specimen: Nasopharyngeal Swab  Result Value Ref Range Status   SARS Coronavirus 2 NEGATIVE NEGATIVE Final    Comment: (NOTE) SARS-CoV-2 target nucleic acids are NOT DETECTED.  The SARS-CoV-2 RNA is generally detectable in upper and lower respiratory specimens during the acute phase of infection. The lowest concentration of SARS-CoV-2 viral copies this assay can detect is 250 copies / mL. A negative result does not preclude SARS-CoV-2 infection and should not  be used as the sole basis for treatment or other patient management decisions.  A negative result may occur with improper specimen collection / handling, submission of specimen other than nasopharyngeal swab, presence of viral mutation(s) within the areas targeted by this assay, and inadequate number of viral copies (<250 copies / mL). A negative result must be combined with clinical observations, patient history, and epidemiological information.  Fact Sheet for Patients:   BoilerBrush.com.cy  Fact Sheet for Healthcare Providers: https://pope.com/  This test is not yet approved or  cleared by the [place] FDA and has been authorized for detection and/or diagnosis of SARS-CoV-2 by FDA under an Emergency Use Authorization (EUA).  This EUA will remain in effect (meaning this test can be used) for the duration of the COVID-19 declaration under Section 564(b)(1) of the Act, 21 U.S.C. section 360bbb-3(b)(1), unless the authorization is terminated or revoked sooner.  Performed at [facility], [address] [place], [place] [address]   MRSA PCR Screening     Status: None   Collection Time: 05/03/20 11:57 PM   Specimen: Nasopharyngeal  Result Value Ref Range Status   MRSA by PCR NEGATIVE NEGATIVE Final    Comment:        The GeneXpert MRSA Assay (FDA approved for NASAL specimens only), is one component of a comprehensive MRSA colonization surveillance program. It is not intended to diagnose MRSA infection nor to guide or monitor treatment for MRSA infections. Performed at [facility], [address] [place], [place] [address]   Body fluid culture     Status: None   Collection Time: 05/07/20 12:00 PM   Specimen: PATH Cytology Peritoneal fluid  Result Value Ref Range Status   Specimen Description   Final    PERITONEAL Performed at [facility], [address] [place], [place] [address]     Special Requests   Final    PERITONEAL Performed at [facility], [address] [place], [place] [address]    Gram Stain   Final    WBC PRESENT,BOTH PMN AND MONONUCLEAR NO ORGANISMS SEEN CYTOSPIN SMEAR    Culture   Final    NO GROWTH 3 DAYS Performed at [facility], [address] [place], [place] [address]    Report Status 05/11/2020 FINAL  Final  Culture, blood (single) w Reflex to ID Panel     Status: None (Preliminary result)   Collection Time: 05/11/20  7:59 AM   Specimen: BLOOD  Result Value Ref Range Status   Specimen Description BLOOD LEFT WRIST  Final   Special Requests   Final    BOTTLES DRAWN AEROBIC AND ANAEROBIC Blood Culture adequate volume   Culture   Final    NO GROWTH 2 DAYS Performed at [facility], [address] [place], [place] [address]    Report Status PENDING  Incomplete    Coagulation Studies: No results for input(s): LABPROT, INR in the last 72 hours.  Imaging: MR BRAIN WO CONTRAST  Addendum Date: 05/12/2020   ADDENDUM REPORT: 05/12/2020 14:38 ADDENDUM: Study discussed by telephone with Dr. Esaw Grandchild on 05/12/2020 at 1432 hours. We discussed that CT Venogram may be the modality of choice for follow-up. Electronically Signed   By: Odessa Fleming M.D.   On: 05/12/2020 14:38   Result Date: 05/12/2020 CLINICAL DATA:  34 year old male with encephalopathy, lethargy. History of ETOH abuse and abruptly stopped drinking the day before presentation (6/24) and became progressively confused. Was brought to the ED by his wife and while in the ED was witnessed to have a seizure EXAM: MRI HEAD WITHOUT CONTRAST TECHNIQUE: Multiplanar, multiecho pulse sequences of the brain and surrounding structures were obtained without intravenous contrast. COMPARISON:  Head CT 05/03/2020. FINDINGS: The examination had to be discontinued prior to completion by patient request. Only axial FLAIR imaging was not obtained, although the coronal T2 imaging is  nondiagnostic due to motion. Brain: There is a 2 cm area of restricted diffusion in the posterior right cerebellum on series 2, image 14 with patchy T2 hyperintensity. No associated hemorrhage or mass effect. No evidence of this on the comparison CT. Furthermore there is gyriform restricted diffusion in the right parietal lobe on series 2, image 31, and a punctate area of cortical restricted diffusion in the left parietal lobe on image 33. There might also be trace restricted diffusion in the medial left occipital pole. No evidence of associated hemorrhage or intracranial mass effect. Superimposed severely age advanced volume loss of the brainstem and cerebellum (series 6, image 11). No ventriculomegaly. No extra-axial collection. Negative pituitary. Negative cervicomedullary junction. Vascular: Major intracranial arterial flow voids appear grossly preserved. However, there is an abnormal appearance of the torcula and left transverse sinus on DWI imaging, with a slightly heterogeneous appearance of the sinuses on T2. Skull and upper cervical spine: Negative visible cervical spine and bone marrow signal. Sinuses/Orbits: Negative orbits. Trace paranasal sinus mucosal thickening. Other: Mastoid air cells are clear. Visible internal auditory structures appear normal. IMPRESSION: 1. Mildly motion degraded study which had to be discontinued prior to completion. 2. 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 Head MRV without and with contrast would best evaluate further. Alternatively, CT Venogram (CT head with contrast using venogram protocol) could be pursued if the patient is unable to cooperate with MRI. 3. Associated abnormal diffusion in the parietal lobes, right cerebellum, and possibly also the left occipital pole which may reflect a combination of venous infarcts and postictal changes. 4. No associated hemorrhage or mass effect. 5. Underlying severely age  advanced volume loss of the brainstem and cerebellum Electronically Signed: By: Odessa Fleming M.D. On: 05/12/2020 14:29   MR ABDOMEN MRCP W WO CONTAST  Result Date: 05/12/2020 CLINICAL DATA:  Pancreatic cyst/pseudocyst, altered mental status, chronic alcohol abuse, prior abnormal CT EXAM: MRI ABDOMEN WITHOUT AND WITH CONTRAST (INCLUDING MRCP) TECHNIQUE: Multiplanar multisequence MR imaging of the abdomen was performed both before and after the administration of intravenous contrast. Heavily T2-weighted images of the biliary and pancreatic ducts were  obtained, and three-dimensional MRCP images were rendered by post processing. CONTRAST:  30mL GADAVIST GADOBUTROL 1 MMOL/ML IV SOLN COMPARISON:  CT abdomen pelvis, 05/04/2020 FINDINGS: Lower chest: Moderate bilateral pleural effusions. Hepatobiliary: No mass or other parenchymal abnormality identified on noncontrast imaging. No obvious, overt morphologic stigmata of cirrhosis. Gallbladder wall thickening, nonspecific in the setting of ascites. Pancreas: Multiple redemonstrated fluid signal lesions within the pancreatic head and neck, the largest in the pancreatic head adjacent to the distal pancreatic duct measuring 3.8 x 3.3 cm (series 4, image 27). There is no pancreatic ductal dilatation. There may be some adjacent peripancreatic inflammatory stranding, difficult to discern in the setting of large volume ascites. Spleen:  Within normal limits in size and appearance. Adrenals/Urinary Tract: No masses identified. No evidence of hydronephrosis. Stomach/Bowel: Visualized portions within the abdomen are unremarkable. Vascular/Lymphatic: No pathologically enlarged lymph nodes identified. No abdominal aortic aneurysm demonstrated. Other:  Large volume ascites throughout the abdomen. Musculoskeletal: No suspicious bone lesions identified. IMPRESSION: 1. Multiple redemonstrated fluid signal lesions within the pancreatic head and neck, the largest in the pancreatic head adjacent to  the distal pancreatic duct measuring 3.8 cm. There may be some adjacent peripancreatic inflammatory stranding, difficult to discern in the setting of large volume ascites. 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 No pancreatic ductal dilatation. 2. No obvious, overt morphologic stigmata of cirrhosis. No evidence of hepatic mass or other parenchymal abnormality on noncontrast imaging. 3. Large volume ascites throughout the abdomen. 4. Moderate bilateral pleural effusions. Electronically Signed   By: Lauralyn Primes M.D.   On: 05/12/2020 14:58   CT VENOGRAM HEAD  Result Date: 05/12/2020 CLINICAL DATA:  Dural venous sinus thrombosis suspected. EXAM: CT VENOGRAM HEAD TECHNIQUE: Postcontrast CT venography of the head was performed following the administration of 75 mL Omnipaque 350 intravenous contrast. Coronal and sagittal reconstructions were submitted for evaluation. Axial, coronal and sagittal MIP reconstructions were also submitted. CONTRAST:  42mL OMNIPAQUE IOHEXOL 350 MG/ML SOLN COMPARISON:  Brain MRI 05/12/2020. 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 Of note, additional small foci of diffusion weighted abnormality were demonstrated within the parietal lobes on brain MRI performed earlier the same day. There is no acute intracranial hemorrhage. No extra-axial fluid collection. No evidence of intracranial mass. No midline shift. There is a filling defect within the confluence of sinuses and extending into the left transverse dural venous sinus compatible with dural venous sinus thrombosis. The superior sagittal sinus, internal cerebral veins, vein of Galen, straight sinus, right transverse sinus, sigmoid sinuses and visualized jugular veins are patent. No calvarial fracture or suspicious osseous lesion. Visualized orbits show no acute finding. No significant paranasal sinus disease or  mastoid effusion at the imaged levels. These results will be called to the ordering clinician or representative by the Radiologist Assistant, and communication documented in the PACS or [employer]. 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 No evidence of hemorrhagic conversion. Of note, small foci of restricted diffusion were demonstrated within the bilateral parietal lobes on same-day brain MRI. Please refer to this prior examination for further description. Electronically Signed   By: Jackey Loge DO   On: 05/12/2020 17:38    Medications:  I have reviewed the patient's current medications. Scheduled: . aspirin EC  81 mg Oral Daily  . atorvastatin  40 mg Oral Daily  .  carvedilol  12.5 mg Oral BID WC  . Chlorhexidine Gluconate Cloth  6 each Topical Daily  . feeding supplement (ENSURE ENLIVE)  237 mL Oral TID BM  . folic acid  1 mg Oral Daily  . furosemide  40 mg Oral Daily  . multivitamin with minerals  1 tablet Oral Daily  . naltrexone  50 mg Oral Daily  . pantoprazole  40 mg Oral BID AC  . saccharomyces boulardii  250 mg Oral BID  . sodium chloride flush  3 mL Intravenous Q12H  . spironolactone  25 mg Oral Daily  . thiamine  100 mg Oral Daily   Or  . thiamine  100 mg Intravenous Daily    Assessment/Plan: 33 y.o. male with a history of alcohol abuse who is unable to provide history due to lethargy therefore all history obtained from the chart.  Patient with a long history of ETOH abuse.  Abruptly stopped drinking the day before presentation (6/24) and became progressively confused.  Was brought to the ED by his wife and while in the ED was witnessed to have a seizure.  Patient has not had further seizures but remained confused and lethargic. There still remains some confusion but patient improved.  MRI of the brain personally reviewed and reveals a possible  cerebral venous thrombosis as well as abnormal diffusion in the right cerebellum, bilateral parietal lobes and left occipital pole.  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 Thyroid normal.  B1 and EEG pending.    Recommendations: 1. Although some findings on MRI suggestive of changes related to seizure patient now with improved mental status and does not show signs clinically of continued seizure activity.  Will not start anticonvulsant therapy at this time.  EEG pending.   2. Thiamine level pending 3. Heparin to be initiated.  Conversation with attending concerning patient and discussed that there are no medical contraindications to the use of heparin.   4. Will continue to follow    LOS: 10 days   Thana Farr, MD Neurology [phone] 05/13/2020  8:52 AM

## 2020-05-13 NOTE — Consult Note (Signed)
Pharmacy Antibiotic Note  Shane Smigiel Sr. is a 34 y.o. male admitted on 05/03/2020 with alcohol withdrawal and seizures.  Pharmacy has been consulted for Zosyn  Dosing for intra-abdominal infection. WBC: 20.4   Plan: Start Zosyn 3.375 IV EI every 8 hours   Height: 6\' 1"  (185.4 cm) Weight: 63.5 kg (140 lb) IBW/kg (Calculated) : 79.9  Temp (24hrs), Avg:97.9 F (36.6 C), Min:97.7 F (36.5 C), Max:98.2 F (36.8 C)  Recent Labs  Lab 05/08/20 0740 05/10/20 0520 05/11/20 0603 05/12/20 0557 05/13/20 0618  WBC 17.2* 18.6* 19.5* 19.4* 20.4*  CREATININE 0.79 0.54* 0.60* 0.65 0.56*    Estimated Creatinine Clearance: 116.9 mL/min (A) (by C-G formula based on SCr of 0.56 mg/dL (L)).    Allergies  Allergen Reactions  . Sudafed [Pseudoephedrine]     History per pt of this being linked with his seizures    Antimicrobials this admission: 6/28 meropenem >>  7/1 7/4 Zosyn>>  Microbiology results: 7/2 BCx: NG x 2 days  6/28 Peritoneal Cx: NG x 3 days  6/24 MRSA PCR: negative   Thank you for allowing pharmacy to be a part of this patient's care.  7/24, PharmD, BCPS Clinical Pharmacist 05/13/2020 8:55 AM

## 2020-05-13 NOTE — Consult Note (Signed)
ANTICOAGULATION CONSULT NOTE - Initial Consult  Pharmacy Consult for Heparin infusion  Indication: cerebral venous thrombosis  Allergies  Allergen Reactions   Sudafed [Pseudoephedrine]     History per pt of this being linked with his seizures   Patient Measurements: Height: 6\' 1"  (185.4 cm) Weight: 63.5 kg (140 lb) IBW/kg (Calculated) : 79.9  Vital Signs: Temp: 97.9 F (36.6 C) (07/04 1538) Temp Source: Oral (07/04 1538) BP: 112/67 (07/04 1538) Pulse Rate: 86 (07/04 1538)  Labs: Recent Labs    05/11/20 0603 05/11/20 0603 05/12/20 0557 05/13/20 0618 05/13/20 0910 05/13/20 1736  HGB 11.1*   < > 10.4* 10.5*  --   --   HCT 31.2*  --  29.5* 30.3*  --   --   PLT 219  --  239 246  --   --   APTT  --   --   --   --  30  --   LABPROT  --   --   --   --  13.8  --   INR  --   --   --   --  1.1  --   HEPARINUNFRC  --   --   --   --   --  <0.10*  CREATININE 0.60*  --  0.65 0.56*  --   --    < > = values in this interval not displayed.    Estimated Creatinine Clearance: 116.9 mL/min (A) (by C-G formula based on SCr of 0.56 mg/dL (L)).   Medical History: Past Medical History:  Diagnosis Date   Alcohol use disorder, severe, dependence (HCC)    Assessment: Pharmacy consulted for heparin infusion dosing and monitoring for 34 yo male for cerebral venous thrombosis.   CT Head: "Confirmed thrombus within the confluence of sinuses and left transverse dural venous sinus. 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 No evidence of hemorrhagic conversion."   Goal of Therapy:  Heparin level 0.3-0.5 units/ml Monitor platelets by anticoagulation protocol: Yes    Plan:  No Bolus (CVA dosing)  Start heparin infusion at 800 units/hr Check anti-Xa level in 6 hours and daily while on heparin Continue to monitor H&H and platelets  7/4:  HL @ 1736 = < 0.1  Will increase heparin drip to 1000 units/hr and recheck HL 6 hrs after rate change.  20,  PharmD Clinical Pharmacist 05/13/2020 6:56 PM

## 2020-05-13 NOTE — Consult Note (Signed)
ANTICOAGULATION CONSULT NOTE - Initial Consult  Pharmacy Consult for Heparin infusion  Indication: cerebral venous thrombosis  Allergies  Allergen Reactions  . Sudafed [Pseudoephedrine]     History per pt of this being linked with his seizures   Patient Measurements: Height: 6\' 1"  (185.4 cm) Weight: 63.5 kg (140 lb) IBW/kg (Calculated) : 79.9  Vital Signs: Temp: 97.7 F (36.5 C) (07/04 0746) Temp Source: Oral (07/04 0746) BP: 115/83 (07/04 0746) Pulse Rate: 106 (07/04 0746)  Labs: Recent Labs    05/10/20 1206 05/11/20 0603 05/11/20 0603 05/12/20 0557 05/13/20 0618  HGB  --  11.1*   < > 10.4* 10.5*  HCT  --  31.2*  --  29.5* 30.3*  PLT  --  219  --  239 246  CREATININE  --  0.60*  --  0.65 0.56*  TROPONINIHS 32*  --   --   --   --    < > = values in this interval not displayed.    Estimated Creatinine Clearance: 116.9 mL/min (A) (by C-G formula based on SCr of 0.56 mg/dL (L)).   Medical History: Past Medical History:  Diagnosis Date  . Alcohol use disorder, severe, dependence (HCC)    Assessment: Pharmacy consulted for heparin infusion dosing and monitoring for 34 yo male for cerebral venous thrombosis.   CT Head: "Confirmed thrombus within the confluence of sinuses and left transverse dural venous sinus. 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 No evidence of hemorrhagic conversion."   Goal of Therapy:  Heparin level 0.3-0.5 units/ml Monitor platelets by anticoagulation protocol: Yes    Plan:  No Bolus (CVA dosing)  Start heparin infusion at 800 units/hr Check anti-Xa level in 6 hours and daily while on heparin Continue to monitor H&H and platelets  20, PharmD, BCPS Clinical Pharmacist 05/13/2020 9:13 AM

## 2020-05-14 ENCOUNTER — Inpatient Hospital Stay: Payer: Medicaid Other

## 2020-05-14 DIAGNOSIS — E876 Hypokalemia: Secondary | ICD-10-CM

## 2020-05-14 LAB — COMPREHENSIVE METABOLIC PANEL
ALT: 24 U/L (ref 0–44)
AST: 34 U/L (ref 15–41)
Albumin: 1.6 g/dL — ABNORMAL LOW (ref 3.5–5.0)
Alkaline Phosphatase: 87 U/L (ref 38–126)
Anion gap: 8 (ref 5–15)
BUN: 17 mg/dL (ref 6–20)
CO2: 28 mmol/L (ref 22–32)
Calcium: 7.3 mg/dL — ABNORMAL LOW (ref 8.9–10.3)
Chloride: 99 mmol/L (ref 98–111)
Creatinine, Ser: 0.66 mg/dL (ref 0.61–1.24)
GFR calc Af Amer: >60 mL/min (ref >60)
GFR calc non Af Amer: >60 mL/min (ref >60)
Glucose, Bld: 141 mg/dL — ABNORMAL HIGH (ref 70–99)
Potassium: 3.4 mmol/L — ABNORMAL LOW (ref 3.5–5.1)
Sodium: 135 mmol/L (ref 135–145)
Total Bilirubin: 0.7 mg/dL (ref 0.3–1.2)
Total Protein: 4.4 g/dL — ABNORMAL LOW (ref 6.5–8.1)

## 2020-05-14 LAB — PROTIME-INR
INR: 1.1 (ref 0.8–1.2)
Prothrombin Time: 13.8 seconds (ref 11.4–15.2)

## 2020-05-14 LAB — MAGNESIUM: Magnesium: 2 mg/dL (ref 1.7–2.4)

## 2020-05-14 LAB — CBC
HCT: 28.6 % — ABNORMAL LOW (ref 39.0–52.0)
Hemoglobin: 10.1 g/dL — ABNORMAL LOW (ref 13.0–17.0)
MCH: 32.9 pg (ref 26.0–34.0)
MCHC: 35.3 g/dL (ref 30.0–36.0)
MCV: 93.2 fL (ref 80.0–100.0)
Platelets: 185 10*3/uL (ref 150–400)
RBC: 3.07 MIL/uL — ABNORMAL LOW (ref 4.22–5.81)
RDW: 12.5 % (ref 11.5–15.5)
WBC: 20.3 10*3/uL — ABNORMAL HIGH (ref 4.0–10.5)
nRBC: 0 % (ref 0.0–0.2)

## 2020-05-14 LAB — HEPARIN LEVEL (UNFRACTIONATED)
Heparin Unfractionated: 0.2 IU/mL — ABNORMAL LOW (ref 0.30–0.70)
Heparin Unfractionated: 0.7 IU/mL (ref 0.30–0.70)
Heparin Unfractionated: <0.1 IU/mL — ABNORMAL LOW (ref 0.30–0.70)

## 2020-05-14 LAB — GLUCOSE, CAPILLARY
Glucose-Capillary: 177 mg/dL — ABNORMAL HIGH (ref 70–99)
Glucose-Capillary: 122 mg/dL — ABNORMAL HIGH (ref 70–99)
Glucose-Capillary: 156 mg/dL — ABNORMAL HIGH (ref 70–99)

## 2020-05-14 LAB — LIPASE, BLOOD: Lipase: 1510 U/L — ABNORMAL HIGH (ref 11–51)

## 2020-05-14 IMAGING — CT CT HEAD W/O CM
3 series · 15 of 47 positions shown, 18 images · non-contrast
Comparison: CTP and head MRI [DATE]. head CT [DATE].

CLINICAL DATA: 34-year-old male with encephalopathy found to have
dural venous sinus thrombosis and posterior circulation infarcts
and/or postictal changes on recent MRI and CT venogram. ETOH abuse,
initially presenting with DON LOLITO.

EXAM:
CT HEAD WITHOUT CONTRAST
TECHNIQUE: Contiguous axial images were obtained from the base of the skull
through the vertex without intravenous contrast.

[Series 2: head wo · axial · 0.47mm/px · z∈[-183,-48]mm · 9 of 33 slices shown, 12 images]
[im 3/33  brain]
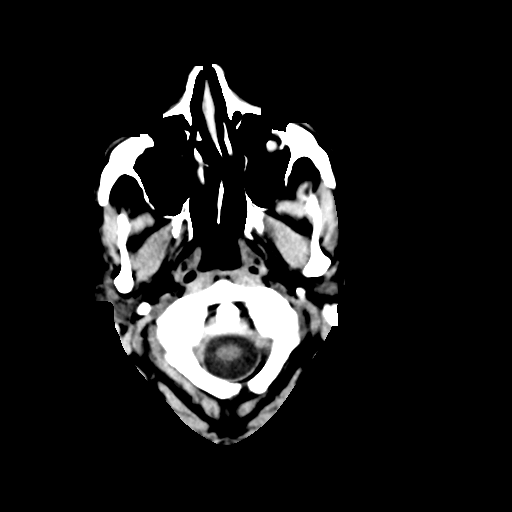
[im 3/33  bone]
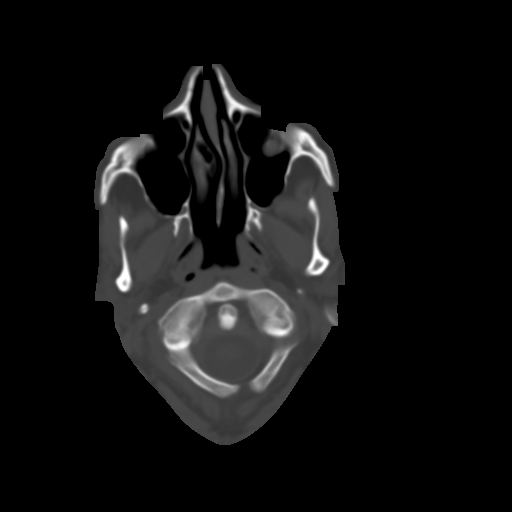
[im 6/33  brain]
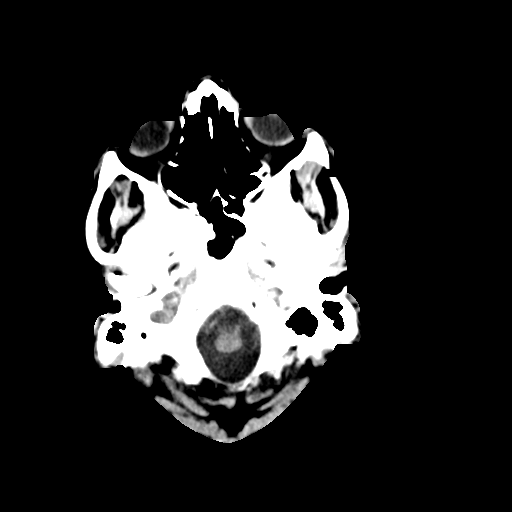
[im 9/33  brain]
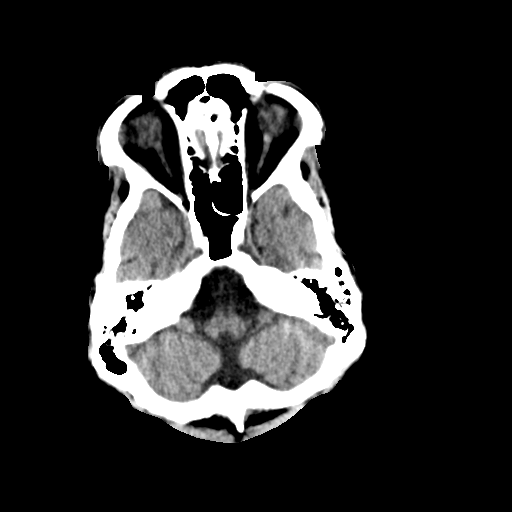
[im 13/33  brain]
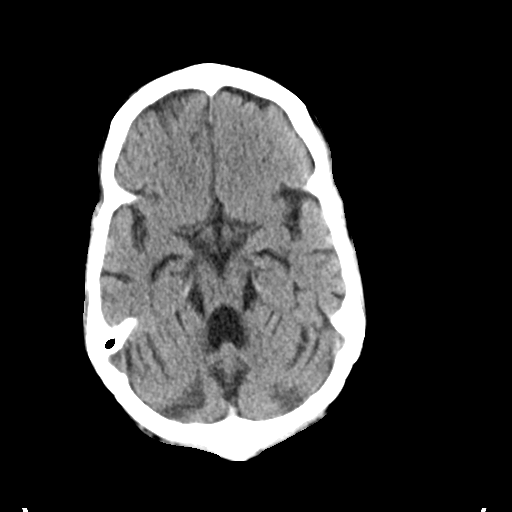
[im 17/33  brain]
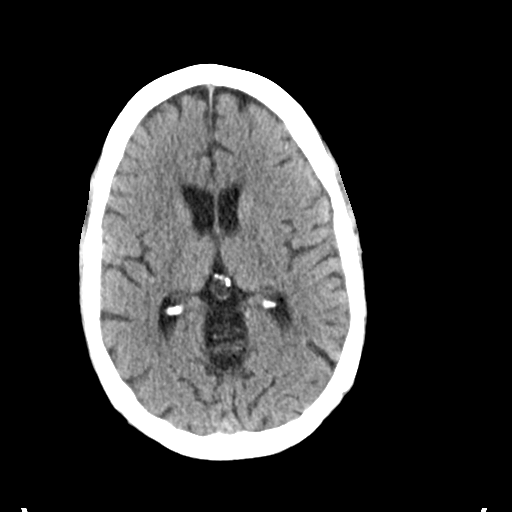
[im 17/33  bone]
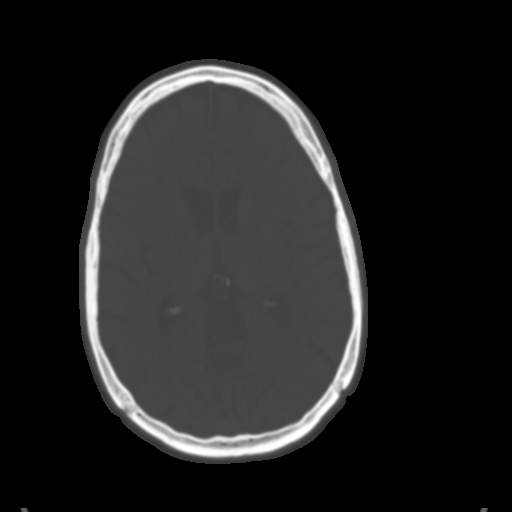
[im 20/33  brain]
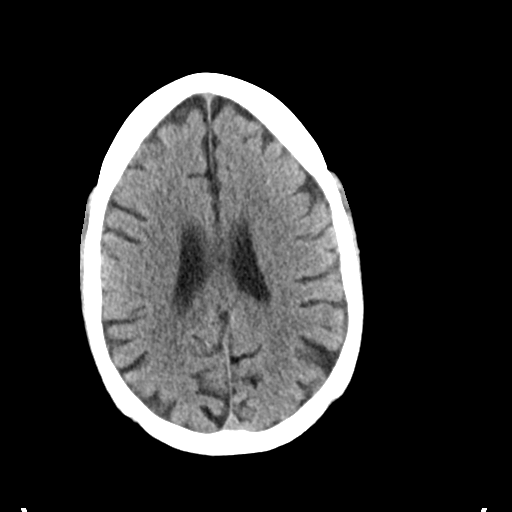
[im 24/33  brain]
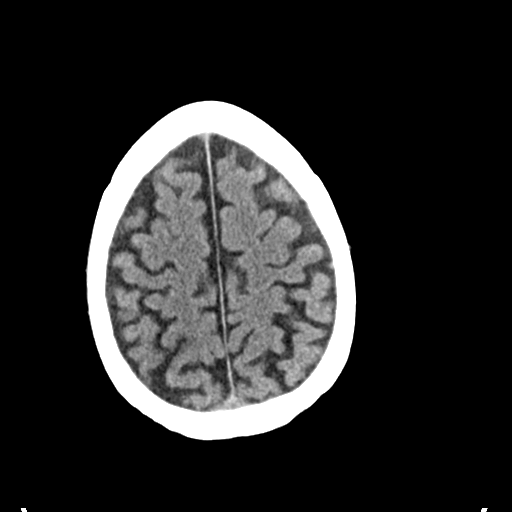
[im 27/33  brain]
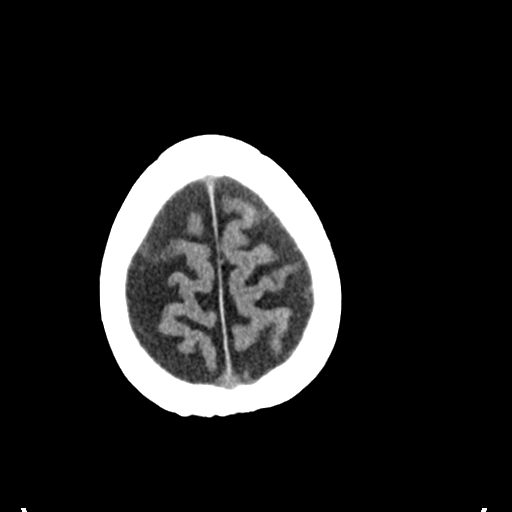
[im 30/33  brain]
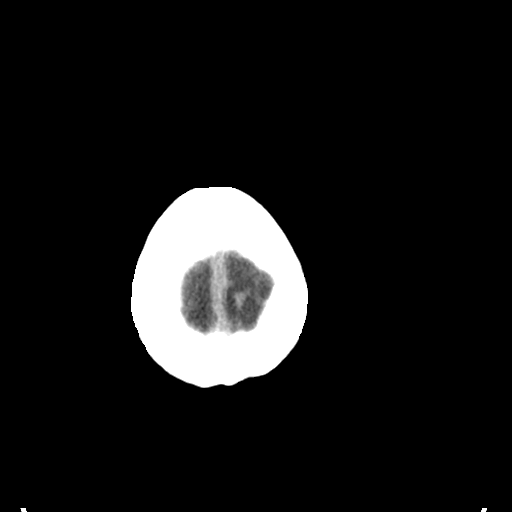
[im 30/33  bone]
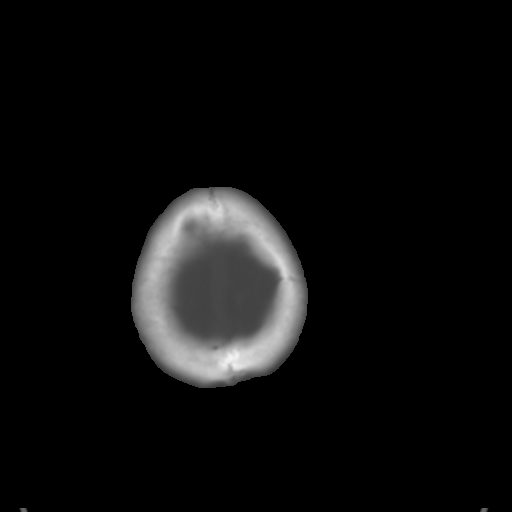

[Series 4: coronal soft tissue · coronal · 0.30mm/px · 3 of 66 slices shown]
[im 22/66  brain]
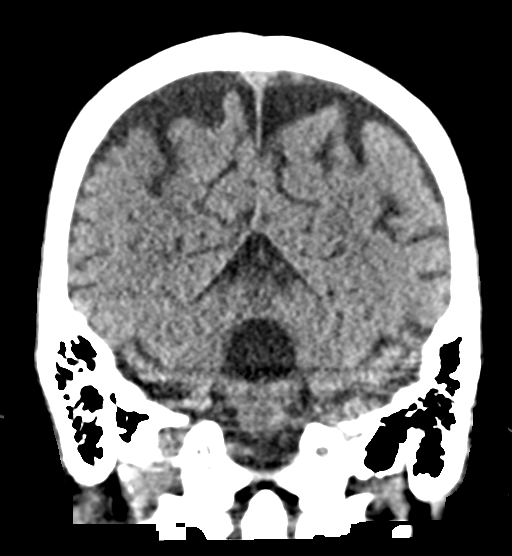
[im 29/66  brain]
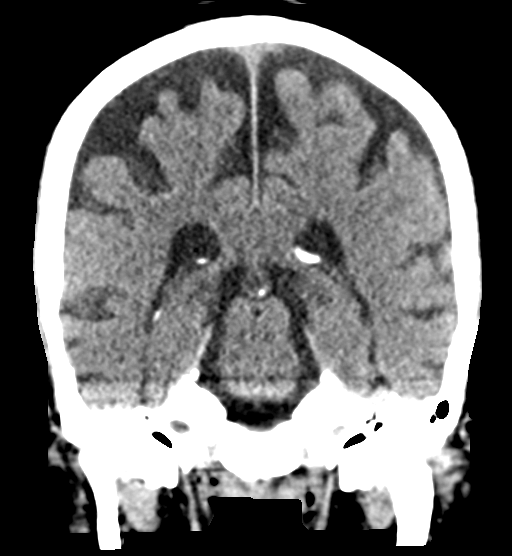
[im 37/66  brain]
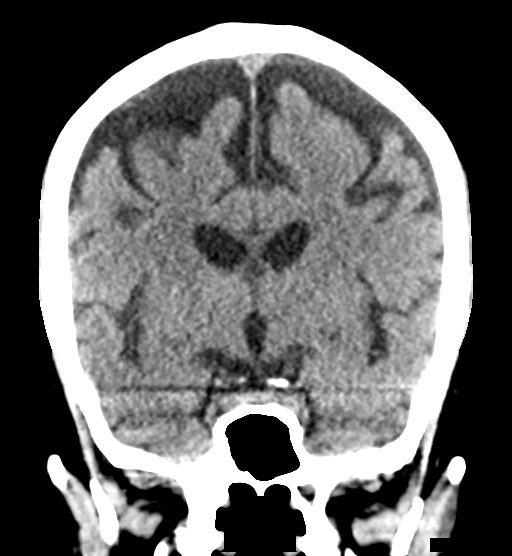

[Series 5: sagittal soft tissue · sagittal · 0.32mm/px · 3 of 51 slices shown]
[im 17/51  brain]
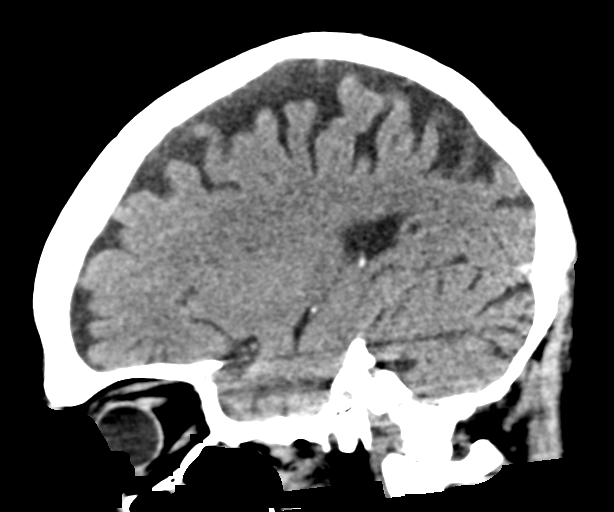
[im 26/51  brain]
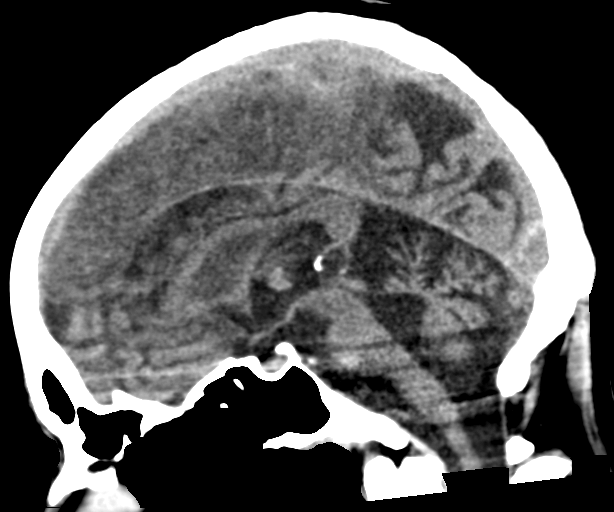
[im 34/51  brain]
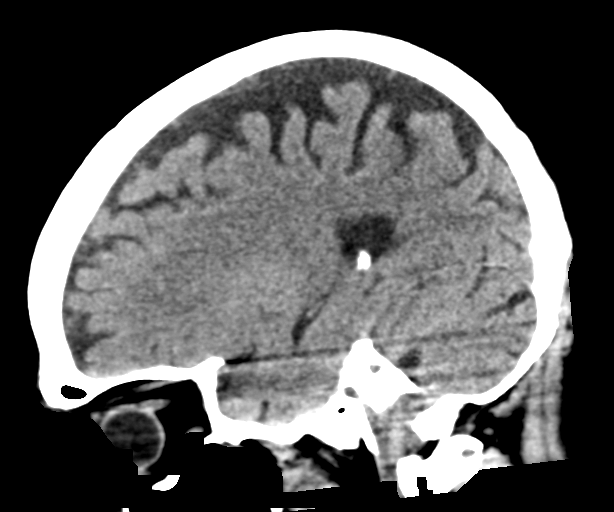

[15 of 47 positions shown; findings below may reference images not displayed]

FINDINGS: Brain: Mild hypodensity in the posterior right cerebellum
corresponding to the diffusion abnormality compatible with small
infarct. No hemorrhage or mass effect (series 4, image 54). There is
also subtle hypodensity in the posterior right parietal lobe
corresponding to some of the other abnormal diffusion along the
posterior cerebral hemispheres (series 2, image 23).

No acute intracranial hemorrhage identified. No midline shift, mass
effect, or evidence of intracranial mass lesion. No
ventriculomegaly. Generalized cerebral volume loss again noted. No
other abnormal gray-white matter differentiation.

Vascular: No suspicious intracranial vascular hyperdensity. Patency
of the torcula and left transverse sinus not evaluated in the
absence of contrast.

Skull: No acute osseous abnormality identified.

Sinuses/Orbits: Visualized paranasal sinuses and mastoids are stable
and well pneumatized.

Other: Visualized orbits and scalp soft tissues are within normal
limits.
IMPRESSION: 1. Expected CT appearance of small infarct in the posterior right
cerebellum.
And subtle evidence of infarct in the posterior right parietal lobe,
making postictal explanation of the DWI abnormality there less
likely.

2. No associated hemorrhage or mass effect.

3. Patency of the torcula and left transverse sinus not evaluated in
the absence of contrast.

4. No new intracranial abnormality.

## 2020-05-14 MED ORDER — CARVEDILOL 3.125 MG PO TABS: 6.2500 mg | ORAL_TABLET | Freq: Two times a day (BID) | ORAL | Status: DC

## 2020-05-14 MED ORDER — POTASSIUM CHLORIDE CRYS ER 20 MEQ PO TBCR
40.0000 meq | EXTENDED_RELEASE_TABLET | Freq: Once | ORAL | Status: AC
  Administered 2020-05-14: 12:00:00 40 meq via ORAL
  Filled 2020-05-14: qty 2

## 2020-05-14 MED ORDER — HEPARIN BOLUS VIA INFUSION
1500.0000 [IU] | Freq: Once | INTRAVENOUS | Status: DC
  Filled 2020-05-14: qty 1500

## 2020-05-14 MED ORDER — CARVEDILOL 3.125 MG PO TABS
6.2500 mg | ORAL_TABLET | Freq: Two times a day (BID) | ORAL | Status: DC
  Filled 2020-05-14: qty 2

## 2020-05-14 MED ORDER — LORAZEPAM 2 MG/ML IJ SOLN
0.5000 mg | Freq: Four times a day (QID) | INTRAMUSCULAR | Status: DC | PRN
  Administered 2020-05-14 – 2020-05-29 (×4): 0.5 mg via INTRAVENOUS
  Filled 2020-05-14 (×4): qty 1

## 2020-05-14 NOTE — Progress Notes (Signed)
Subjective: Patient awake and alert but has been more confused.  Hallucinating.    Objective: Current vital signs: BP 99/71 (BP Location: Left Arm)   Pulse (!) 59   Temp 97.6 F (36.4 C)   Resp 16   Ht 6\' 1"  (1.854 m)   Wt 63.5 kg   SpO2 92%   BMI 18.47 kg/m  Vital signs in last 24 hours: Temp:  [97.6 F (36.4 C)-98.8 F (37.1 C)] 97.6 F (36.4 C) (07/05 0848) Pulse Rate:  [59-88] 59 (07/05 0848) Resp:  [16-20] 16 (07/05 0848) BP: (96-120)/(67-76) 99/71 (07/05 0848) SpO2:  [92 %-100 %] 92 % (07/05 0848)  Intake/Output from previous day: 07/04 0701 - 07/05 0700 In: 615.6 [I.V.:465.6; IV Piggyback:150] Out: 225 [Urine:225] Intake/Output this shift: No intake/output data recorded. Nutritional status:  Diet Order            DIET SOFT Room service appropriate? Yes; Fluid consistency: Thin  Diet effective now                 Neurologic Exam: Mental Status: Alert.  Does not identify wife when asked.  Follows simple commands at times.  Speech dysarthric Cranial Nerves: II: Blinks to bilateral confrontation III,IV, VI: Extra-ocular motions intact bilaterally V,VII: smile symmetric VIII: hearing normal bilaterally IX,X: gag reflex present XI: bilateral shoulder shrug XII: midline tongue extension Motor: Moves all extremities strongly against gravity Sensory: Pinprick and light touch intact throughout, bilaterally  Lab Results: Basic Metabolic Panel: Recent Labs  Lab 05/08/20 0740 05/08/20 0740 05/10/20 0520 05/10/20 0520 05/11/20 0603 05/11/20 0603 05/12/20 0557 05/13/20 0618 05/14/20 0040  NA 129*   < > 132*  --  134*  --  134* 135 135  K 4.3   < > 3.6  --  3.4*  --  3.1* 3.8 3.4*  CL 96*   < > 98  --  95*  --  97* 101 99  CO2 20*   < > 25  --  30  --  27 26 28   GLUCOSE 100*   < > 115*  --  117*  --  111* 113* 141*  BUN 13   < > 9  --  9  --  14 16 17   CREATININE 0.79   < > 0.54*  --  0.60*  --  0.65 0.56* 0.66  CALCIUM 7.5*   < > 7.8*   < > 7.8*   <  > 7.8* 7.9* 7.3*  MG 1.7   < > 1.5*  --  1.7  --  1.6* 1.7 2.0  PHOS 3.1  --  3.8  --  3.8  --  4.3 4.3  --    < > = values in this interval not displayed.    Liver Function Tests: Recent Labs  Lab 05/10/20 0520 05/11/20 0603 05/12/20 0557 05/13/20 0618 05/14/20 0040  AST 63* 50* 31 34 34  ALT 30 27 22 24 24   ALKPHOS 143* 134* 104 95 87  BILITOT 1.0 0.9 1.2 0.8 0.7  PROT 4.4* 4.6* 4.5* 4.7* 4.4*  ALBUMIN 1.6* 1.6* 1.6* 1.7* 1.6*   Recent Labs  Lab 05/08/20 0740 05/10/20 0520 05/13/20 0618  LIPASE 552* 825* 1,543*   Recent Labs  Lab 05/09/20 1544 05/12/20 1117  AMMONIA 24 <9*    CBC: Recent Labs  Lab 05/08/20 0740 05/08/20 0740 05/10/20 0520 05/11/20 0603 05/12/20 0557 05/13/20 0618 05/14/20 0040  WBC 17.2*   < > 18.6* 19.5* 19.4* 20.4* 20.3*  NEUTROABS 14.4*  --  15.3* 15.9* 16.4* 17.5*  --   HGB 11.0*   < > 10.7* 11.1* 10.4* 10.5* 10.1*  HCT 31.4*   < > 30.1* 31.2* 29.5* 30.3* 28.6*  MCV 94.9   < > 93.5 92.0 92.5 94.4 93.2  PLT 213   < > 205 219 239 246 185   < > = values in this interval not displayed.    Cardiac Enzymes: No results for input(s): CKTOTAL, CKMB, CKMBINDEX, TROPONINI in the last 168 hours.  Lipid Panel: No results for input(s): CHOL, TRIG, HDL, CHOLHDL, VLDL, LDLCALC in the last 168 hours.  CBG: Recent Labs  Lab 05/13/20 0749 05/13/20 1232 05/13/20 1820 05/13/20 2127 05/14/20 0849  GLUCAP 109* 115* 110* 127* 177*    Microbiology: Results for orders placed or performed during the hospital encounter of 05/03/20  SARS Coronavirus 2 by RT PCR (hospital order, performed in [facility] hospital lab) Nasopharyngeal Nasopharyngeal Swab     Status: None   Collection Time: 05/03/20  8:25 PM   Specimen: Nasopharyngeal Swab  Result Value Ref Range Status   SARS Coronavirus 2 NEGATIVE NEGATIVE Final    Comment: (NOTE) SARS-CoV-2 target nucleic acids are NOT DETECTED.  The SARS-CoV-2 RNA is generally detectable in upper and  lower respiratory specimens during the acute phase of infection. The lowest concentration of SARS-CoV-2 viral copies this assay can detect is 250 copies / mL. A negative result does not preclude SARS-CoV-2 infection and should not be used as the sole basis for treatment or other patient management decisions.  A negative result may occur with improper specimen collection / handling, submission of specimen other than nasopharyngeal swab, presence of viral mutation(s) within the areas targeted by this assay, and inadequate number of viral copies (<250 copies / mL). A negative result must be combined with clinical observations, patient history, and epidemiological information.  Fact Sheet for Patients:   BoilerBrush.com.cy  Fact Sheet for Healthcare Providers: https://pope.com/  This test is not yet approved or  cleared by the [place] FDA and has been authorized for detection and/or diagnosis of SARS-CoV-2 by FDA under an Emergency Use Authorization (EUA).  This EUA will remain in effect (meaning this test can be used) for the duration of the COVID-19 declaration under Section 564(b)(1) of the Act, 21 U.S.C. section 360bbb-3(b)(1), unless the authorization is terminated or revoked sooner.  Performed at [facility] [address] [place], [place] [address]   MRSA PCR Screening     Status: None   Collection Time: 05/03/20 11:57 PM   Specimen: Nasopharyngeal  Result Value Ref Range Status   MRSA by PCR NEGATIVE NEGATIVE Final    Comment:        The GeneXpert MRSA Assay (FDA approved for NASAL specimens only), is one component of a comprehensive MRSA colonization surveillance program. It is not intended to diagnose MRSA infection nor to guide or monitor treatment for MRSA infections. Performed at [facility], [address], [place], [place] [address]   Body fluid culture     Status: None   Collection  Time: 05/07/20 12:00 PM   Specimen: PATH Cytology Peritoneal fluid  Result Value Ref Range Status   Specimen Description   Final    PERITONEAL Performed at [facility] [place] [facility], [address], [place], [place] [address]    Special Requests   Final    PERITONEAL Performed at [facility], [address] [place], [place] [address]  Gram Stain   Final    WBC PRESENT,BOTH PMN AND MONONUCLEAR NO ORGANISMS SEEN CYTOSPIN SMEAR    Culture   Final    NO GROWTH 3 DAYS Performed at [facility], [address], [place], [place] [address]    Report Status 05/11/2020 FINAL  Final  Culture, blood (single) w Reflex to ID Panel     Status: None (Preliminary result)   Collection Time: 05/11/20  7:59 AM   Specimen: BLOOD  Result Value Ref Range Status   Specimen Description BLOOD LEFT WRIST  Final   Special Requests   Final    BOTTLES DRAWN AEROBIC AND ANAEROBIC Blood Culture adequate volume   Culture   Final    NO GROWTH 3 DAYS Performed at [facility], [address], [place], [place] [address]    Report Status PENDING  Incomplete    Coagulation Studies: Recent Labs    05/13/20 0910 05/14/20 0040  LABPROT 13.8 13.8  INR 1.1 1.1    Imaging: MR BRAIN WO CONTRAST  Addendum Date: 05/12/2020   ADDENDUM REPORT: 05/12/2020 14:38 ADDENDUM: Study discussed by telephone with Dr. Esaw Grandchild on 05/12/2020 at 1432 hours. We discussed that CT Venogram may be the modality of choice for follow-up. Electronically Signed   By: Odessa Fleming M.D.   On: 05/12/2020 14:38   Result Date: 05/12/2020 CLINICAL DATA:  34 year old male with encephalopathy, lethargy. History of ETOH abuse and abruptly stopped drinking the day before presentation (6/24) and became progressively confused. Was brought to the ED by his wife and while in the ED was witnessed to have a seizure EXAM: MRI HEAD WITHOUT CONTRAST TECHNIQUE: Multiplanar, multiecho pulse sequences of the brain and surrounding  structures were obtained without intravenous contrast. COMPARISON:  Head CT 05/03/2020. FINDINGS: The examination had to be discontinued prior to completion by patient request. Only axial FLAIR imaging was not obtained, although the coronal T2 imaging is nondiagnostic due to motion. Brain: There is a 2 cm area of restricted diffusion in the posterior right cerebellum on series 2, image 14 with patchy T2 hyperintensity. No associated hemorrhage or mass effect. No evidence of this on the comparison CT. Furthermore there is gyriform restricted diffusion in the right parietal lobe on series 2, image 31, and a punctate area of cortical restricted diffusion in the left parietal lobe on image 33. There might also be trace restricted diffusion in the medial left occipital pole. No evidence of associated hemorrhage or intracranial mass effect. Superimposed severely age advanced volume loss of the brainstem and cerebellum (series 6, image 11). No ventriculomegaly. No extra-axial collection. Negative pituitary. Negative cervicomedullary junction. Vascular: Major intracranial arterial flow voids appear grossly preserved. However, there is an abnormal appearance of the torcula and left transverse sinus on DWI imaging, with a slightly heterogeneous appearance of the sinuses on T2. Skull and upper cervical spine: Negative visible cervical spine and bone marrow signal. Sinuses/Orbits: Negative orbits. Trace paranasal sinus mucosal thickening. Other: Mastoid air cells are clear. Visible internal auditory structures appear normal. IMPRESSION: 1. Mildly motion degraded study which had to be discontinued prior to completion. 2. 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 Head MRV without and with contrast would best evaluate further. Alternatively, CT Venogram (CT head with contrast using venogram protocol) could be pursued if the patient is unable to cooperate with MRI. 3.  Associated abnormal diffusion in the parietal lobes, right cerebellum, and possibly also the left occipital  pole which may reflect a combination of venous infarcts and postictal changes. 4. No associated hemorrhage or mass effect. 5. Underlying severely age advanced volume loss of the brainstem and cerebellum Electronically Signed: By: Odessa Fleming M.D. On: 05/12/2020 14:29   MR ABDOMEN MRCP W WO CONTAST  Result Date: 05/12/2020 CLINICAL DATA:  Pancreatic cyst/pseudocyst, altered mental status, chronic alcohol abuse, prior abnormal CT EXAM: MRI ABDOMEN WITHOUT AND WITH CONTRAST (INCLUDING MRCP) TECHNIQUE: Multiplanar multisequence MR imaging of the abdomen was performed both before and after the administration of intravenous contrast. Heavily T2-weighted images of the biliary and pancreatic ducts were obtained, and three-dimensional MRCP images were rendered by post processing. CONTRAST:  66mL GADAVIST GADOBUTROL 1 MMOL/ML IV SOLN COMPARISON:  CT abdomen pelvis, 05/04/2020 FINDINGS: Lower chest: Moderate bilateral pleural effusions. Hepatobiliary: No mass or other parenchymal abnormality identified on noncontrast imaging. No obvious, overt morphologic stigmata of cirrhosis. Gallbladder wall thickening, nonspecific in the setting of ascites. Pancreas: Multiple redemonstrated fluid signal lesions within the pancreatic head and neck, the largest in the pancreatic head adjacent to the distal pancreatic duct measuring 3.8 x 3.3 cm (series 4, image 27). There is no pancreatic ductal dilatation. There may be some adjacent peripancreatic inflammatory stranding, difficult to discern in the setting of large volume ascites. Spleen:  Within normal limits in size and appearance. Adrenals/Urinary Tract: No masses identified. No evidence of hydronephrosis. Stomach/Bowel: Visualized portions within the abdomen are unremarkable. Vascular/Lymphatic: No pathologically enlarged lymph nodes identified. No abdominal aortic aneurysm  demonstrated. Other:  Large volume ascites throughout the abdomen. Musculoskeletal: No suspicious bone lesions identified. IMPRESSION: 1. Multiple redemonstrated fluid signal lesions within the pancreatic head and neck, the largest in the pancreatic head adjacent to the distal pancreatic duct measuring 3.8 cm. There may be some adjacent peripancreatic inflammatory stranding, difficult to discern in the setting of large volume ascites. 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 No pancreatic ductal dilatation. 2. No obvious, overt morphologic stigmata of cirrhosis. No evidence of hepatic mass or other parenchymal abnormality on noncontrast imaging. 3. Large volume ascites throughout the abdomen. 4. Moderate bilateral pleural effusions. Electronically Signed   By: Lauralyn Primes M.D.   On: 05/12/2020 14:58   CT VENOGRAM HEAD  Result Date: 05/12/2020 CLINICAL DATA:  Dural venous sinus thrombosis suspected. EXAM: CT VENOGRAM HEAD TECHNIQUE: Postcontrast CT venography of the head was performed following the administration of 75 mL Omnipaque 350 intravenous contrast. Coronal and sagittal reconstructions were submitted for evaluation. Axial, coronal and sagittal MIP reconstructions were also submitted. CONTRAST:  20mL OMNIPAQUE IOHEXOL 350 MG/ML SOLN COMPARISON:  Brain MRI 05/12/2020. 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 Of note, additional small foci of diffusion weighted abnormality were demonstrated within the parietal lobes on brain MRI performed earlier the same day. There is no acute intracranial hemorrhage. No extra-axial fluid collection. No evidence of intracranial mass. No midline shift. There is a filling defect within the confluence of sinuses and extending into the left transverse dural venous sinus compatible with dural venous sinus thrombosis. The superior sagittal sinus, internal  cerebral veins, vein of Galen, straight sinus, right transverse sinus, sigmoid sinuses and visualized jugular veins are patent. No calvarial fracture or suspicious osseous lesion. Visualized orbits show no acute finding. No significant paranasal sinus disease or mastoid effusion at the imaged levels. These results will be called to the ordering clinician or representative by the Radiologist Assistant, and communication  documented in the PACS or [employer]. 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 No evidence of hemorrhagic conversion. Of note, small foci of restricted diffusion were demonstrated within the bilateral parietal lobes on same-day brain MRI. Please refer to this prior examination for further description. Electronically Signed   By: Jackey Loge DO   On: 05/12/2020 17:38    Medications:  I have reviewed the patient's current medications. Scheduled: . aspirin EC  81 mg Oral Daily  . atorvastatin  40 mg Oral Daily  . carvedilol  6.25 mg Oral BID WC  . Chlorhexidine Gluconate Cloth  6 each Topical Daily  . feeding supplement (ENSURE ENLIVE)  237 mL Oral TID BM  . folic acid  1 mg Oral Daily  . furosemide  40 mg Oral Daily  . multivitamin with minerals  1 tablet Oral Daily  . pantoprazole  40 mg Oral BID AC  . saccharomyces boulardii  250 mg Oral BID  . sodium chloride flush  3 mL Intravenous Q12H  . spironolactone  25 mg Oral Daily  . thiamine  100 mg Oral Daily   Or  . thiamine  100 mg Intravenous Daily    Assessment/Plan: 34 y.o.malewith a long history of ETOH abuse. Abruptly stopped drinking the day before presentation (6/24) and became progressively confused. Was brought to the ED by his wife and while in the ED was witnessed to have a seizure. Patient has not had further seizures but remained confused and lethargic.  MRI of the brain personally  reviewed and reveals a possible cerebral venous thrombosis as well as abnormal diffusion in the right cerebellum, bilateral parietal lobes and left occipital pole.  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 Thyroid normal.  B1 and EEG pending.  Confusion appeared to be improving but today has worsened.  Now with hallucinations as well.  Recommendations: 1. Repeat head CT to rule out hemorrhage or development of hydrocephalus 2. Thiamine level pending 3. EEG pending  4. Continue heparin   LOS: 11 days   Thana Farr, MD Neurology [phone] 05/14/2020  12:00 PM

## 2020-05-14 NOTE — Progress Notes (Signed)
Pt refusing all care.  Becoming very agitated, attempting to pull IV out of arm, and he would not allow nurse to remove the IV tubing from his hand.  He actually hissed at a nurse helping to get IV tubing out of his hand.  He refused to allow [place] to get VS at midnight and 0400. Side rails up to help prevent him falling out of bed.

## 2020-05-14 NOTE — Consult Note (Signed)
ANTICOAGULATION CONSULT NOTE - Initial Consult  Pharmacy Consult for Heparin infusion  Indication: cerebral venous thrombosis  Allergies  Allergen Reactions  . Sudafed [Pseudoephedrine]     History per pt of this being linked with his seizures   Patient Measurements: Height: 6\' 1"  (185.4 cm) Weight: 63.5 kg (140 lb) IBW/kg (Calculated) : 79.9  Vital Signs: Temp: 97.6 F (36.4 C) (07/04 1950) BP: 96/76 (07/04 1950) Pulse Rate: 87 (07/04 1950)  Labs: Recent Labs    05/12/20 0557 05/12/20 0557 05/13/20 0618 05/13/20 0910 05/13/20 1736 05/14/20 0040  HGB 10.4*   < > 10.5*  --   --  10.1*  HCT 29.5*  --  30.3*  --   --  28.6*  PLT 239  --  246  --   --  185  APTT  --   --   --  30  --   --   LABPROT  --   --   --  13.8  --  13.8  INR  --   --   --  1.1  --  1.1  HEPARINUNFRC  --   --   --   --  <0.10* 0.20*  CREATININE 0.65  --  0.56*  --   --  0.66   < > = values in this interval not displayed.    Estimated Creatinine Clearance: 116.9 mL/min (by C-G formula based on SCr of 0.66 mg/dL).   Medical History: Past Medical History:  Diagnosis Date  . Alcohol use disorder, severe, dependence (HCC)    Assessment: Pharmacy consulted for heparin infusion dosing and monitoring for 34 yo male for cerebral venous thrombosis.   CT Head: "Confirmed thrombus within the confluence of sinuses and left transverse dural venous sinus. 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 No evidence of hemorrhagic conversion."   Goal of Therapy:  Heparin level 0.3-0.5 units/ml Monitor platelets by anticoagulation protocol: Yes    Plan:  07/05 @ 0040 HL 0.20 subtherapeutic. Will increase rate to 1150 units/hr and will recheck HL at 1000, CBC stable will continue to monitor.  09/05, PharmD Clinical Pharmacist 05/14/2020 3:54 AM

## 2020-05-14 NOTE — Procedures (Signed)
ELECTROENCEPHALOGRAM REPORT   Patient: Shane Kitt Sr.       Room #: 109A-AA EEG No. ID: [id number] Age: 34 y.o.        Sex: male Requesting Physician: Denton Lank Report Date:  05/14/2020        Interpreting Physician: Thana Farr  History: [name] is an 34 y.o. male with altered mental status  Medications:  Heparin, Lipitor, COreg, Zosyn, Aldactone, Thiamine  Conditions of Recording:  This is a 21 channel routine scalp EEG performed with bipolar and monopolar montages arranged in accordance to the international 10/20 system of electrode placement. One channel was dedicated to EKG recording.  The patient is in the awake state.  Description:  Artifact is prominent during the recording often obscuring the background rhythm due to a low voltage beta activity that is diffusely distributed.  When able to be visualized the background is slow and poorly organized and consists of activity in the delta theta continuum that is seen diffusely.  This activity is continuous throughout the recording.  No epileptiform activity is noted. Hyperventilation was not performed.  Intermittent photic stimulation was performed but failed to illicit any change in the tracing.    IMPRESSION: This is an abnormal EEG secondary to general background slowing.  This finding may be seen with a diffuse disturbance that is etiologically nonspecific, but may include a metabolic encephalopathy, among other possibilities.  No epileptiform activity is noted.     Thana Farr, MD Neurology [phone] 05/14/2020, 1:56 PM

## 2020-05-14 NOTE — Consult Note (Signed)
ANTICOAGULATION CONSULT NOTE  Pharmacy Consult for Heparin infusion  Indication: cerebral venous thrombosis  Allergies  Allergen Reactions  . Sudafed [Pseudoephedrine]     History per pt of this being linked with his seizures   Patient Measurements: Height: 6\' 1"  (185.4 cm) Weight: 63.5 kg (140 lb) IBW/kg (Calculated) : 79.9  Vital Signs: Temp: 97.6 F (36.4 C) (07/05 0848) BP: 99/71 (07/05 0848) Pulse Rate: 59 (07/05 0848)  Labs: Recent Labs    05/12/20 0557 05/12/20 0557 05/13/20 0618 05/13/20 0910 05/13/20 1736 05/14/20 0040 05/14/20 1004  HGB 10.4*   < > 10.5*  --   --  10.1*  --   HCT 29.5*  --  30.3*  --   --  28.6*  --   PLT 239  --  246  --   --  185  --   APTT  --   --   --  30  --   --   --   LABPROT  --   --   --  13.8  --  13.8  --   INR  --   --   --  1.1  --  1.1  --   HEPARINUNFRC  --   --   --   --  <0.10* 0.20* 0.70  CREATININE 0.65  --  0.56*  --   --  0.66  --    < > = values in this interval not displayed.    Estimated Creatinine Clearance: 116.9 mL/min (by C-G formula based on SCr of 0.66 mg/dL).   Medical History: Past Medical History:  Diagnosis Date  . Alcohol use disorder, severe, dependence (HCC)    Assessment: Pharmacy consulted for heparin infusion dosing and monitoring for 34 yo male for cerebral venous thrombosis.   CT Head: "Confirmed thrombus within the confluence of sinuses and left transverse dural venous sinus. 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 No evidence of hemorrhagic conversion."   07/05 @ 0040 HL 0.20 subtherapeutic. Will increase rate to 1150 units/hr and will recheck HL at 1000  Goal of Therapy:  Heparin level 0.3-0.5 units/ml Monitor platelets by anticoagulation protocol: Yes    Plan:  07/05 @ 1004 HL 0.70 supratherapeutic for desired HL of 0.3-0.5 units/ml. Will decrease rate to 1000 units/hr and will recheck HL in 6 hrs,Hgb 10.1  Plt 185  Horace Lukas A, PharmD Clinical  Pharmacist 05/14/2020 10:57 AM

## 2020-05-14 NOTE — Progress Notes (Addendum)
PROGRESS NOTE    Shane Sheehan Sr.   FUX:[id number]  DOB: Apr 16, 1986  PCP: [name], MD    DOA: 05/03/2020 LOS: 72   Brief Narrative   34 year old gentleman with no medical history, chronic alcoholism, has been drinking nonstop about 12 packs of beer daily since age of 14 presented to the ED on  05/03/2020 for evaluation of progressive confusion and lethargy.  He also had episode of unresponsiveness in the triage area.  As per patient's fianc, patient stopped drinking about 24 hours prior to arrival and started behaving altered, confused.  At the same time, patient has multiple episodes of loose watery stool, some of them dark appearing.  No nausea or vomiting.  In the ED, he was ill-appearing, BP stable, tachycardic with heart rate 141, respirations 21, and afebrile.  Labs were notable for sodium 124, potassium was 2.8, creatinine 1.75, bilirubin 2.  Anion gap 19.  CK 700.  Chest x-ray was essentially normal.  CT head was normal.    CT abdomen/pelvis showed:  Edema adjacent to head and body of pancreas question pancreatitis (recommend correlation with serum lipase/amylase levels), cystic lesions at the pancreas, largest at head 4.4 cm diameter, could represent pseudocysts in a patient with pancreatitis or cystic pancreatic neoplasms; recommend attention on follow-up imaging to ensure resolution, or may characterize by MR, moderate ascites with mild fatty  infiltration of liver, small BILATERAL pleural effusions and minimal bibasilar atelectasis (other non-pertinent findings in report).   Admitted to the hospitalist service for further evaluation and management on acute alcohol withdrawal syndrome, acute pancreatitis and possible SBP (ruled out).     Patient also with abdominal pain and diarrhea and worsening distention.  General surgery was consulted.  Intra-abdominal pressure monitoring < 20 and stable hemodynamics, no end organ damage.  Paracentesis on 6/28 yielded 2.8 L ascitic fluid  with elevated neutrophils.  Started on meropenem pending fluid cultures, this was stopped after no growth on cultures at 48 hours.  Abdominal distention remains stable and no tenderness.      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  Neurology following.  Heparin infusion.  Neuro-checks.  Repeat head CT today.  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  ASA and statin started.  Neurology following.  Generalized weakness - due to chronic alcoholism and malnutrition as well as prolonged acute illness.  He is requiring heavy assistance with bed mobility and standing.   PT recommends SNF.  TOC consulted.    Acute metabolic encephalopathy - Was improving, but has been worse and progressive past two days (7/4-5), having hallucinations, agitation at times.  No longer receiving benzos for alcohol withdrawal.  CT head upon admission was negative for acute findings that showed generalized atrophy advanced for his age.  Exam is non-focal.  Ammonia level normal x 2.  B12 normal.  UDS on admission was never done, please collect and process.  Neurology consulted, appreciate input.  EEG consistent with encephalopathy, no epileptiform activity.   Mild TSH elevation but normal T4.  Repeat head CT today.  Follow up thiamine level.    Alcohol withdrawal syndrome - present on admission Alcohol dependence / Alcohol use disorder Completed Librium taper.  Stop CIWA protocol.  Continue thiamine and  folic acid.  Recommend inpatient rehab if patient amenable, however he is too weak to ambulate, would need to be stronger and independent.  Advise intensive psychosocial support after discharge.  Patient agreeable to medication for AUD, recommend starting naltrexone 50  mg PO daily prior to discharge if no contraindications.  Acute alcoholic pancreatitis -present on admission with abdominal pain, tachycardia,  diarrhea, hypotension.  No gallbladder abnormalities on CT abdomen.  Lipase continues to rise, today 1543. Continue supportive care with IV hydration, pain control.  Advance diet to soft, further advance as tolerated. Off empiric meropenem given negative ascites cultures.  Monitor abdominal exam.   With worsening abdominal distention, surgery was consulted.  Intra-abdominal pressure monitoring showed intra-abdominal pressure less than 20 with no hemodynamic instability.  No endorgan damage.  Surgery recommends management of ascites and pancreatitis.  Distention improving with diuretics.  Started empiric Zosyn in case of developing infection.    Severe Protein Calorie Malnutrition - POA, due to chronic alcohol abuse and not eating regular food.  Minimal PO intake during admission, but diet started on 7/3.  Patient tolerating but not taking in enough calories yet.  Dietician consulted.  May require tube feeding soon, recommendations have been made.  Daily CMP, Mg, Phos.  High risk of refeeding syndrome.  GI consulted for consideration of G-J tube placement.  Would be high risk for surgical J tube.  Goals of care - had discussion today with wife and her mother at bedside regarding unclear prognosis at this time.  They agree to palliative consult.  Do want to pursue artificial feeding.  Otherwise continue full scope of care at this time.   Hematuria - new 7/3, in addition to pt reporting dysuria.  UA with reflex is pending.  Diarrhea - present on admission, persistent.  No fevers or leukocytosis, very unlikely infectious.  Suspect malabsorption due to pancreatitis.  Imodium PRN.  Leukocytosis - likely due to pancreatitis, no other evidence of infection.  UA negative.  No respiratory symptoms.  Monitor closely.  Did start empiric Zosyn today (7/4) given persistent  high wbc, concern for developing pancreatic infection or SBP.  Follow blood cultures, no growth so far.  Sinus tachycardia - resolved after starting empiric antibiotics.  Had been persistently elevated HR despite being past alcohol withdrawal and pain better from pancreatitis.  Ascites fluid cultures negative to date.  Started low dose of Coreg for BP and HR control.  Continue to manage underlying issues as above.  Blood cultures negative to date.  No respiratory or urinary symptoms.  Ascites - his liver has mild fatty infiltration on imaging, not cirrhotic at this point, this is likely due to pancreatitis and hypoalbuminemia.  Underwent paracentesis on 6/28 with 2.8 L fluid removed. Ordered repeat paracentesis (7/1) due to increased distention, but not enough fluid seen on ultrasound to do safely.  Large volume ascites seen on MRI abdomen, so will try US paracentesis again, ordered for Monday.  Re-culture ascites fluid to r/o SBP.  Continue Lasix and spironolactone.    Elevated TSH - free T4 normal.    Chest pain - Resolved.  Reported by pt on AM of 7/1.  Troponin mildly elevated at 32, likely demand ischemia from persistent tachycardia in setting of alcohol withdrawal.  No acute ischemic changes on EKG, only non-specific T wave changes.  Monitor.  Hypervolemic hyponatremia -POA with sodium 124.  Suspect due to liver disease.  Monitor BMP.  Improving with diuretics.  Na 134.  Acute renal failure -present  on admission with creatinine 1.75.  Likely prerenal azotemia in the setting of above.  Improved with IV hydration.  Monitor BMP and urine output.  Hypokalemia -present on admission, replaced.  Monitor BMP, Mg, Phos and replace as needed.    DVT prophylaxis: SCDs Start: 05/03/20 2153   Diet:  Diet Orders (From admission, onward)    Start     Ordered   05/11/20 1118  DIET SOFT Room service appropriate? Yes; Fluid consistency: Thin  Diet effective now       Question Answer Comment  Room  service appropriate? Yes   Fluid consistency: Thin      05/11/20 1118            Code Status: Full Code    Subjective 05/14/20    Patient seen at bedside with wife and her mother at bedside.  Overnight, patient was agitated, "hissed" at nursing staff.  They called wife to help calm him down.  This AM, wife reports pt mostly nonverbal today, seems to be having hallucinations, more confused.  Patient is unable to tell me if having pain or other symptoms.     Disposition Plan & Communication   Status is: Inpatient  Remains inpatient appropriate because:Inpatient level of care appropriate due to severity of illness.  With recurrent encephalopathy.  Ongoing encephalopathy, profound generalized weakness and inability to stand or ambulate.  Feeding tube placement pending.     Dispo: The patient is from: Home              Anticipated d/c is to: Home              Anticipated d/c date is: > 3 days              Patient currently is not medically stable to d/c.   Family Communication: wife at bedside during encounter   Consults, Procedures, Significant Events   Consultants:   None  Procedures:   Paracentesis 6/28  Antimicrobials:   Meropenem (6/28 to 7/1)  Zosyn 7/4 >>  Objective   Vitals:   05/13/20 1231 05/13/20 1538 05/13/20 1950 05/14/20 0848  BP: 120/70 112/67 96/76 99/71   Pulse: 88 86 87 (!) 59  Resp: 16 18 20 16   Temp: 98.8 F (37.1 C) 97.9 F (36.6 C) 97.6 F (36.4 C) 97.6 F (36.4 C)  TempSrc:  Oral    SpO2: 100% 100% 100% 92%  Weight:      Height:        Intake/Output Summary (Last 24 hours) at 05/14/2020 1521 Last data filed at 05/14/2020 0500 Gross per 24 hour  Intake 556.11 ml  Output --  Net 556.11 ml   Filed Weights   05/03/20 2232  Weight: 63.5 kg    Physical Exam:  General exam: awake, alert, no acute distress, underweight, chronically ill appearing HEENT: very dry mucus membranes, hearing grossly normal, pale lips  Respiratory system:  CTAB but inspiration is somewhat shallow, normal respiratory effort, no wheezes or rhonchi. Cardiovascular system: normal S1/S2, RRR, no lower extremity edema.   Gastrointestinal system: still distended but not tender, active bowel sounds. Central nervous system: Unable to assess orientation today, patient not verbal. No gross focal neurologic deficits.   Extremities: moves all, no cyanosis, normal tone Skin: dry, intact, normal temperature, pale Psychiatric: appears to be having hallucinations, non verbal  Labs   Data Reviewed: I have personally reviewed following labs and imaging studies  CBC: Recent Labs  Lab 05/08/20 0740 05/08/20 0740 05/10/20 07/11/20  05/11/20 0603 05/12/20 0557 05/13/20 0618 05/14/20 0040  WBC 17.2*   < > 18.6* 19.5* 19.4* 20.4* 20.3*  NEUTROABS 14.4*  --  15.3* 15.9* 16.4* 17.5*  --   HGB 11.0*   < > 10.7* 11.1* 10.4* 10.5* 10.1*  HCT 31.4*   < > 30.1* 31.2* 29.5* 30.3* 28.6*  MCV 94.9   < > 93.5 92.0 92.5 94.4 93.2  PLT 213   < > 205 219 239 246 185   < > = values in this interval not displayed.   Basic Metabolic Panel: Recent Labs  Lab 05/08/20 0740 05/08/20 0740 05/10/20 0520 05/11/20 0603 05/12/20 0557 05/13/20 0618 05/14/20 0040  NA 129*   < > 132* 134* 134* 135 135  K 4.3   < > 3.6 3.4* 3.1* 3.8 3.4*  CL 96*   < > 98 95* 97* 101 99  CO2 20*   < > GLUCOSE 100*   < > 115* 117* 111* 113* 141*  BUN 13   < > CREATININE 0.79   < > 0.54* 0.60* 0.65 0.56* 0.66  CALCIUM 7.5*   < > 7.8* 7.8* 7.8* 7.9* 7.3*  MG 1.7   < > 1.5* 1.7 1.6* 1.7 2.0  PHOS 3.1  --  3.8 3.8 4.3 4.3  --    < > = values in this interval not displayed.   GFR: Estimated Creatinine Clearance: 116.9 mL/min (by C-G formula based on SCr of 0.66 mg/dL). Liver Function Tests: Recent Labs  Lab 05/10/20 0520 05/11/20 0603 05/12/20 0557 05/13/20 0618 05/14/20 0040  AST 63* 50* 31 34 34  ALT ALKPHOS 143* 134* 104 95 87  BILITOT 1.0  0.9 1.2 0.8 0.7  PROT 4.4* 4.6* 4.5* 4.7* 4.4*  ALBUMIN 1.6* 1.6* 1.6* 1.7* 1.6*   Recent Labs  Lab 05/08/20 0740 05/10/20 0520 05/13/20 0618 05/14/20 0040  LIPASE 552* 825* 1,543* 1,510*   Recent Labs  Lab 05/09/20 1544 05/12/20 1117  AMMONIA 24 <9*   Coagulation Profile: Recent Labs  Lab 05/13/20 0910 05/14/20 0040  INR 1.1 1.1   Cardiac Enzymes: No results for input(s): CKTOTAL, CKMB, CKMBINDEX, TROPONINI in the last 168 hours. BNP (last 3 results) No results for input(s): PROBNP in the last 8760 hours. HbA1C: No results for input(s): HGBA1C in the last 72 hours. CBG: Recent Labs  Lab 05/13/20 0749 05/13/20 1232 05/13/20 1820 05/13/20 2127 05/14/20 0849  GLUCAP 109* 115* 110* 127* 177*   Lipid Profile: No results for input(s): CHOL, HDL, LDLCALC, TRIG, CHOLHDL, LDLDIRECT in the last 72 hours. Thyroid Function Tests: Recent Labs    05/12/20 0557 05/12/20 1117  TSH 6.997*  --   FREET4  --  1.10   Anemia Panel: No results for input(s): VITAMINB12, FOLATE, FERRITIN, TIBC, IRON, RETICCTPCT in the last 72 hours. Sepsis Labs: No results for input(s): PROCALCITON, LATICACIDVEN in the last 168 hours.  Recent Results (from the past 240 hour(s))  Body fluid culture     Status: None   Collection Time: 05/07/20 12:00 PM   Specimen: PATH Cytology Peritoneal fluid  Result Value Ref Range Status   Specimen Description   Final    PERITONEAL Performed at [facility], [address], [place], [place] [address]    Special Requests   Final    PERITONEAL Performed at [facility], [address] [place], [place] [address]  Gram Stain   Final    WBC PRESENT,BOTH PMN AND MONONUCLEAR NO ORGANISMS SEEN CYTOSPIN SMEAR    Culture   Final    NO GROWTH 3 DAYS Performed at [facility], [address] [place], [place] [address]    Report Status 05/11/2020 FINAL  Final  Culture, blood (single) w Reflex to ID Panel     Status: None  (Preliminary result)   Collection Time: 05/11/20  7:59 AM   Specimen: BLOOD  Result Value Ref Range Status   Specimen Description BLOOD LEFT WRIST  Final   Special Requests   Final    BOTTLES DRAWN AEROBIC AND ANAEROBIC Blood Culture adequate volume   Culture   Final    NO GROWTH 3 DAYS Performed at [facility], [address] [place], [place] [address]    Report Status PENDING  Incomplete      Imaging Studies   EEG  Result Date: 05/14/2020 Thana Farreynolds, Leslie, MD     05/14/2020  2:01 PM ELECTROENCEPHALOGRAM REPORT Patient: Shane SergeantLarry Engebretsen Sr.       Room #: 109A-AA EEG No. ID: [id number] Age: 34 y.o.        Sex: male Requesting Physician: Denton LankGriffith Report Date:  05/14/2020       Interpreting Physician: Thana FarrEYNOLDS, LESLIE History: Shane SergeantLarry Priest Sr. is an 34 y.o. male with altered mental status Medications: Heparin, Lipitor, COreg, Zosyn, Aldactone, Thiamine Conditions of Recording:  This is a 21 channel routine scalp EEG performed with bipolar and monopolar montages arranged in accordance to the international 10/20 system of electrode placement. One channel was dedicated to EKG recording. The patient is in the awake state. Description:  Artifact is prominent during the recording often obscuring the background rhythm due to a low voltage beta activity that is diffusely distributed.  When able to be visualized the background is slow and poorly organized and consists of activity in the delta theta continuum that is seen diffusely.  This activity is continuous throughout the recording. No epileptiform activity is noted. Hyperventilation was not performed.  Intermittent photic stimulation was performed but failed to illicit any change in the tracing. IMPRESSION: This is an abnormal EEG secondary to general background slowing.  This finding may be seen with a diffuse disturbance that is etiologically nonspecific, but may include a metabolic encephalopathy, among other possibilities.  No epileptiform activity is  noted.  Thana FarrLeslie Reynolds, MD Neurology [phone] 05/14/2020, 1:56 PM   CT HEAD WO CONTRAST  Result Date: 05/14/2020 CLINICAL DATA:  34 year old male with encephalopathy found to have dural venous sinus thrombosis and posterior circulation infarcts and/or postictal changes on recent MRI and CT venogram. ETOH abuse, initially presenting with DTs. EXAM: CT HEAD WITHOUT CONTRAST TECHNIQUE: Contiguous axial images were obtained from the base of the skull through the vertex without intravenous contrast. COMPARISON:  CTP and head MRI 05/12/2020. head CT 05/03/2020. FINDINGS: Brain: Mild hypodensity in the posterior right cerebellum corresponding to the diffusion abnormality compatible with small infarct. No hemorrhage or mass effect (series 4, image 54). There is also subtle hypodensity in the posterior right parietal lobe corresponding to some of the other abnormal diffusion along the posterior cerebral hemispheres (series 2, image 23). No acute intracranial hemorrhage identified. No midline shift, mass effect, or evidence of intracranial mass lesion. No ventriculomegaly. Generalized cerebral volume loss again noted. No other abnormal gray-white matter differentiation. Vascular: No suspicious intracranial vascular hyperdensity. Patency of the torcula and left transverse sinus not evaluated in the absence of  contrast. Skull: No acute osseous abnormality identified. Sinuses/Orbits: Visualized paranasal sinuses and mastoids are stable and well pneumatized. Other: Visualized orbits and scalp soft tissues are within normal limits. IMPRESSION: 1. Expected CT appearance of small infarct in the posterior right cerebellum. And subtle evidence of infarct in the posterior right parietal lobe, making postictal explanation of the DWI abnormality there less likely. 2. No associated hemorrhage or mass effect. 3. Patency of the torcula and left transverse sinus not evaluated in the absence of contrast. 4. No new intracranial  abnormality. Electronically Signed   By: Odessa Fleming M.D.   On: 05/14/2020 12:45   CT VENOGRAM HEAD  Result Date: 05/12/2020 CLINICAL DATA:  Dural venous sinus thrombosis suspected. EXAM: CT VENOGRAM HEAD TECHNIQUE: Postcontrast CT venography of the head was performed following the administration of 75 mL Omnipaque 350 intravenous contrast. Coronal and sagittal reconstructions were submitted for evaluation. Axial, coronal and sagittal MIP reconstructions were also submitted. CONTRAST:  57mL OMNIPAQUE IOHEXOL 350 MG/ML SOLN COMPARISON:  Brain MRI 05/12/2020. 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 Of note, additional small foci of diffusion weighted abnormality were demonstrated within the parietal lobes on brain MRI performed earlier the same day. There is no acute intracranial hemorrhage. No extra-axial fluid collection. No evidence of intracranial mass. No midline shift. There is a filling defect within the confluence of sinuses and extending into the left transverse dural venous sinus compatible with dural venous sinus thrombosis. The superior sagittal sinus, internal cerebral veins, vein of Galen, straight sinus, right transverse sinus, sigmoid sinuses and visualized jugular veins are patent. No calvarial fracture or suspicious osseous lesion. Visualized orbits show no acute finding. No significant paranasal sinus disease or mastoid effusion at the imaged levels. These results will be called to the ordering clinician or representative by the Radiologist Assistant, and communication documented in the PACS or [employer]. 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 No evidence of hemorrhagic conversion. Of note, small foci of restricted diffusion were demonstrated within the bilateral parietal lobes on same-day  brain MRI. Please refer to this prior examination for further description. Electronically Signed   By: Jackey Loge DO   On: 05/12/2020 17:38     Medications   Scheduled Meds: . atorvastatin  40 mg Oral Daily  . carvedilol  6.25 mg Oral BID WC  . Chlorhexidine Gluconate Cloth  6 each Topical Daily  . feeding supplement (ENSURE ENLIVE)  237 mL Oral TID BM  . folic acid  1 mg Oral Daily  . furosemide  40 mg Oral Daily  . multivitamin with minerals  1 tablet Oral Daily  . pantoprazole  40 mg Oral BID AC  . saccharomyces boulardii  250 mg Oral BID  . sodium chloride flush  3 mL Intravenous Q12H  . spironolactone  25 mg Oral Daily  . thiamine  100 mg Oral Daily   Or  . thiamine  100 mg Intravenous Daily   Continuous Infusions: . sodium chloride Stopped (05/13/20 1628)  . dextrose 5% lactated ringers Stopped (05/14/20 0457)  . heparin 1,000 Units/hr (05/14/20 1129)  . piperacillin-tazobactam (ZOSYN)  IV 3.375 g (05/14/20 0614)       LOS: 11 days    Time spent: 38 minutes with > 50% spent in coordination of care or direct patient contact.    Pennie Banter, DO [facility]  05/14/2020, 3:21 PM    If 7PM-7AM, please contact night-coverage. How to contact the [facility] Attending or Consulting provider 7A - 7P or covering provider during after hours 7P -7A, for this patient?    1. Check the care team in [facility] and look for a) attending/consulting [facility] provider listed and b) the [facility] team listed 2. Log into [email or web address] and use [place]'s universal password to access. If you do not have the password, please contact the hospital operator. 3. Locate the [facility] provider you are looking for under [facility] and page to a number that you can be directly reached. 4. If you still have difficulty reaching the provider, please page the [facility] (Director on Call) for the Hospitalists listed on amion for assistance.

## 2020-05-14 NOTE — Consult Note (Addendum)
ANTICOAGULATION CONSULT NOTE  Pharmacy Consult for Heparin infusion  Indication: cerebral venous thrombosis  Allergies  Allergen Reactions  . Sudafed [Pseudoephedrine]     History per pt of this being linked with his seizures   Patient Measurements: Height: 6\' 1"  (185.4 cm) Weight: 63.5 kg (140 lb) IBW/kg (Calculated) : 79.9  Vital Signs: Temp: 98 F (36.7 C) (07/05 1853) Temp Source: Oral (07/05 1853) BP: 91/63 (07/05 1853) Pulse Rate: 74 (07/05 1853)  Labs: Recent Labs    05/12/20 0557 05/12/20 0557 05/13/20 0618 05/13/20 0910 05/13/20 1736 05/14/20 0040 05/14/20 1004 05/14/20 1804  HGB 10.4*   < > 10.5*  --   --  10.1*  --   --   HCT 29.5*  --  30.3*  --   --  28.6*  --   --   PLT 239  --  246  --   --  185  --   --   APTT  --   --   --  30  --   --   --   --   LABPROT  --   --   --  13.8  --  13.8  --   --   INR  --   --   --  1.1  --  1.1  --   --   HEPARINUNFRC  --   --   --   --    < > 0.20* 0.70 <0.10*  CREATININE 0.65  --  0.56*  --   --  0.66  --   --    < > = values in this interval not displayed.    Estimated Creatinine Clearance: 116.9 mL/min (by C-G formula based on SCr of 0.66 mg/dL).   Medical History: Past Medical History:  Diagnosis Date  . Alcohol use disorder, severe, dependence (HCC)    Assessment: Pharmacy consulted for heparin infusion dosing and monitoring for 34 yo male for cerebral venous thrombosis.   CT Head: "Confirmed thrombus within the confluence of sinuses and left transverse dural venous sinus. 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 No evidence of hemorrhagic conversion."   07/05 @ 0040 HL 0.20 subtherapeutic - rate increased to 1150 units/hr  07/05 @ 1004 HL 0.70 supratherapeutic - rate decreased to 1000 units/hr 07/05 @ 1804 HL < 0.10 subtherapeutic  Goal of Therapy:  Heparin level 0.3-0.5 units/ml Monitor platelets by anticoagulation protocol: Yes    Plan:  Pt subtherapeutic with a HL <  0.1  Desired HL of 0.3-0.5 units/ml.   Will increase rate to 1100 units/hr and will recheck HL in 6 hrs per protocol  Hgb 10.1  Plt 185  09/05, PharmD, BCPS Clinical Pharmacist 05/14/2020 7:22 PM

## 2020-05-14 NOTE — Progress Notes (Signed)
PT Cancellation Note  Patient Details Name: Shane Baris Sr. MRN: [id number] DOB: 1986-03-22   Cancelled Treatment:    Reason Eval/Treat Not Completed: Medical issues which prohibited therapy   Pt with new cerebral venous thrombus noted.  Heparin stared 10:59 yesterday morning.  Per Dr. Thad Ranger private chat to clarify precautions, will hold therapy for 48 hours after initiation of heparin.  Anticipate continues therapy tomorrow afternoon.   Danielle Dess 05/14/2020, 9:30 AM

## 2020-05-14 NOTE — Progress Notes (Signed)
eeg completed ° °

## 2020-05-15 ENCOUNTER — Inpatient Hospital Stay: Payer: Self-pay

## 2020-05-15 ENCOUNTER — Inpatient Hospital Stay: Payer: Medicaid Other

## 2020-05-15 DIAGNOSIS — K852 Alcohol induced acute pancreatitis without necrosis or infection: Principal | ICD-10-CM

## 2020-05-15 LAB — CBC WITH DIFFERENTIAL/PLATELET
Abs Immature Granulocytes: 0.69 10*3/uL — ABNORMAL HIGH (ref 0.00–0.07)
Basophils Absolute: 0.1 10*3/uL (ref 0.0–0.1)
Basophils Relative: 0 %
Eosinophils Absolute: 0.1 10*3/uL (ref 0.0–0.5)
Eosinophils Relative: 0 %
HCT: 26.4 % — ABNORMAL LOW (ref 39.0–52.0)
Hemoglobin: 9.1 g/dL — ABNORMAL LOW (ref 13.0–17.0)
Immature Granulocytes: 2 %
Lymphocytes Relative: 3 %
Lymphs Abs: 1 10*3/uL (ref 0.7–4.0)
MCH: 32.7 pg (ref 26.0–34.0)
MCHC: 34.5 g/dL (ref 30.0–36.0)
MCV: 95 fL (ref 80.0–100.0)
Monocytes Absolute: 0.7 10*3/uL (ref 0.1–1.0)
Monocytes Relative: 2 %
Neutro Abs: 31.7 10*3/uL — ABNORMAL HIGH (ref 1.7–7.7)
Neutrophils Relative %: 93 %
Platelets: 126 10*3/uL — ABNORMAL LOW (ref 150–400)
RBC: 2.78 MIL/uL — ABNORMAL LOW (ref 4.22–5.81)
RDW: 12.4 % (ref 11.5–15.5)
Smear Review: NORMAL
WBC: 34.2 10*3/uL — ABNORMAL HIGH (ref 4.0–10.5)
nRBC: 0 % (ref 0.0–0.2)

## 2020-05-15 LAB — BLOOD GAS, VENOUS
Acid-base deficit: 0.7 mmol/L (ref 0.0–2.0)
Bicarbonate: 23.1 mmol/L (ref 20.0–28.0)
O2 Saturation: 76 %
Patient temperature: 37
pCO2, Ven: 34 mmHg — ABNORMAL LOW (ref 44.0–60.0)
pH, Ven: 7.44 — ABNORMAL HIGH (ref 7.250–7.430)
pO2, Ven: 39 mmHg (ref 32.0–45.0)

## 2020-05-15 LAB — CBC
HCT: 28.8 % — ABNORMAL LOW (ref 39.0–52.0)
Hemoglobin: 10.3 g/dL — ABNORMAL LOW (ref 13.0–17.0)
MCH: 32.4 pg (ref 26.0–34.0)
MCHC: 35.8 g/dL (ref 30.0–36.0)
MCV: 90.6 fL (ref 80.0–100.0)
Platelets: 191 10*3/uL (ref 150–400)
RBC: 3.18 MIL/uL — ABNORMAL LOW (ref 4.22–5.81)
RDW: 12.2 % (ref 11.5–15.5)
WBC: 26.3 10*3/uL — ABNORMAL HIGH (ref 4.0–10.5)
nRBC: 0 % (ref 0.0–0.2)

## 2020-05-15 LAB — COMPREHENSIVE METABOLIC PANEL
ALT: 25 U/L (ref 0–44)
ALT: 24 U/L (ref 0–44)
AST: 39 U/L (ref 15–41)
AST: 39 U/L (ref 15–41)
Albumin: 1.5 g/dL — ABNORMAL LOW (ref 3.5–5.0)
Albumin: 1.4 g/dL — ABNORMAL LOW (ref 3.5–5.0)
Alkaline Phosphatase: 81 U/L (ref 38–126)
Alkaline Phosphatase: 79 U/L (ref 38–126)
Anion gap: 9 (ref 5–15)
Anion gap: 9 (ref 5–15)
BUN: 20 mg/dL (ref 6–20)
BUN: 26 mg/dL — ABNORMAL HIGH (ref 6–20)
CO2: 24 mmol/L (ref 22–32)
CO2: 25 mmol/L (ref 22–32)
Calcium: 6.1 mg/dL — CL (ref 8.9–10.3)
Calcium: 5.2 mg/dL — CL (ref 8.9–10.3)
Chloride: 103 mmol/L (ref 98–111)
Chloride: 103 mmol/L (ref 98–111)
Creatinine, Ser: 0.78 mg/dL (ref 0.61–1.24)
Creatinine, Ser: 1.31 mg/dL — ABNORMAL HIGH (ref 0.61–1.24)
GFR calc Af Amer: >60 mL/min (ref >60)
GFR calc Af Amer: >60 mL/min (ref >60)
GFR calc non Af Amer: >60 mL/min (ref >60)
GFR calc non Af Amer: >60 mL/min (ref >60)
Glucose, Bld: 132 mg/dL — ABNORMAL HIGH (ref 70–99)
Glucose, Bld: 147 mg/dL — ABNORMAL HIGH (ref 70–99)
Potassium: 3.8 mmol/L (ref 3.5–5.1)
Potassium: 3.6 mmol/L (ref 3.5–5.1)
Sodium: 136 mmol/L (ref 135–145)
Sodium: 137 mmol/L (ref 135–145)
Total Bilirubin: 0.7 mg/dL (ref 0.3–1.2)
Total Bilirubin: 0.7 mg/dL (ref 0.3–1.2)
Total Protein: 4.4 g/dL — ABNORMAL LOW (ref 6.5–8.1)
Total Protein: 4.1 g/dL — ABNORMAL LOW (ref 6.5–8.1)

## 2020-05-15 LAB — GLUCOSE, CAPILLARY
Glucose-Capillary: 133 mg/dL — ABNORMAL HIGH (ref 70–99)
Glucose-Capillary: 139 mg/dL — ABNORMAL HIGH (ref 70–99)

## 2020-05-15 LAB — AMMONIA: Ammonia: 17 umol/L (ref 9–35)

## 2020-05-15 LAB — TSH: TSH: 11.634 u[IU]/mL — ABNORMAL HIGH (ref 0.350–4.500)

## 2020-05-15 LAB — HEPARIN LEVEL (UNFRACTIONATED)
Heparin Unfractionated: 0.32 IU/mL (ref 0.30–0.70)
Heparin Unfractionated: 0.54 IU/mL (ref 0.30–0.70)
Heparin Unfractionated: 0.54 IU/mL (ref 0.30–0.70)
Heparin Unfractionated: 0.2 IU/mL — ABNORMAL LOW (ref 0.30–0.70)

## 2020-05-15 LAB — PHOSPHORUS: Phosphorus: 3.7 mg/dL (ref 2.5–4.6)

## 2020-05-15 LAB — LACTIC ACID, PLASMA: Lactic Acid, Venous: 2 mmol/L (ref 0.5–1.9)

## 2020-05-15 LAB — LIPASE, BLOOD: Lipase: 1141 U/L — ABNORMAL HIGH (ref 11–51)

## 2020-05-15 LAB — MAGNESIUM: Magnesium: 1.8 mg/dL (ref 1.7–2.4)

## 2020-05-15 IMAGING — DX DG CHEST 1V PORT
1 series · 1 of 1 positions shown · non-contrast
Comparison: [DATE]

CLINICAL DATA: Status post PICC line placement

EXAM:
PORTABLE CHEST 1 VIEW

[chest ap]
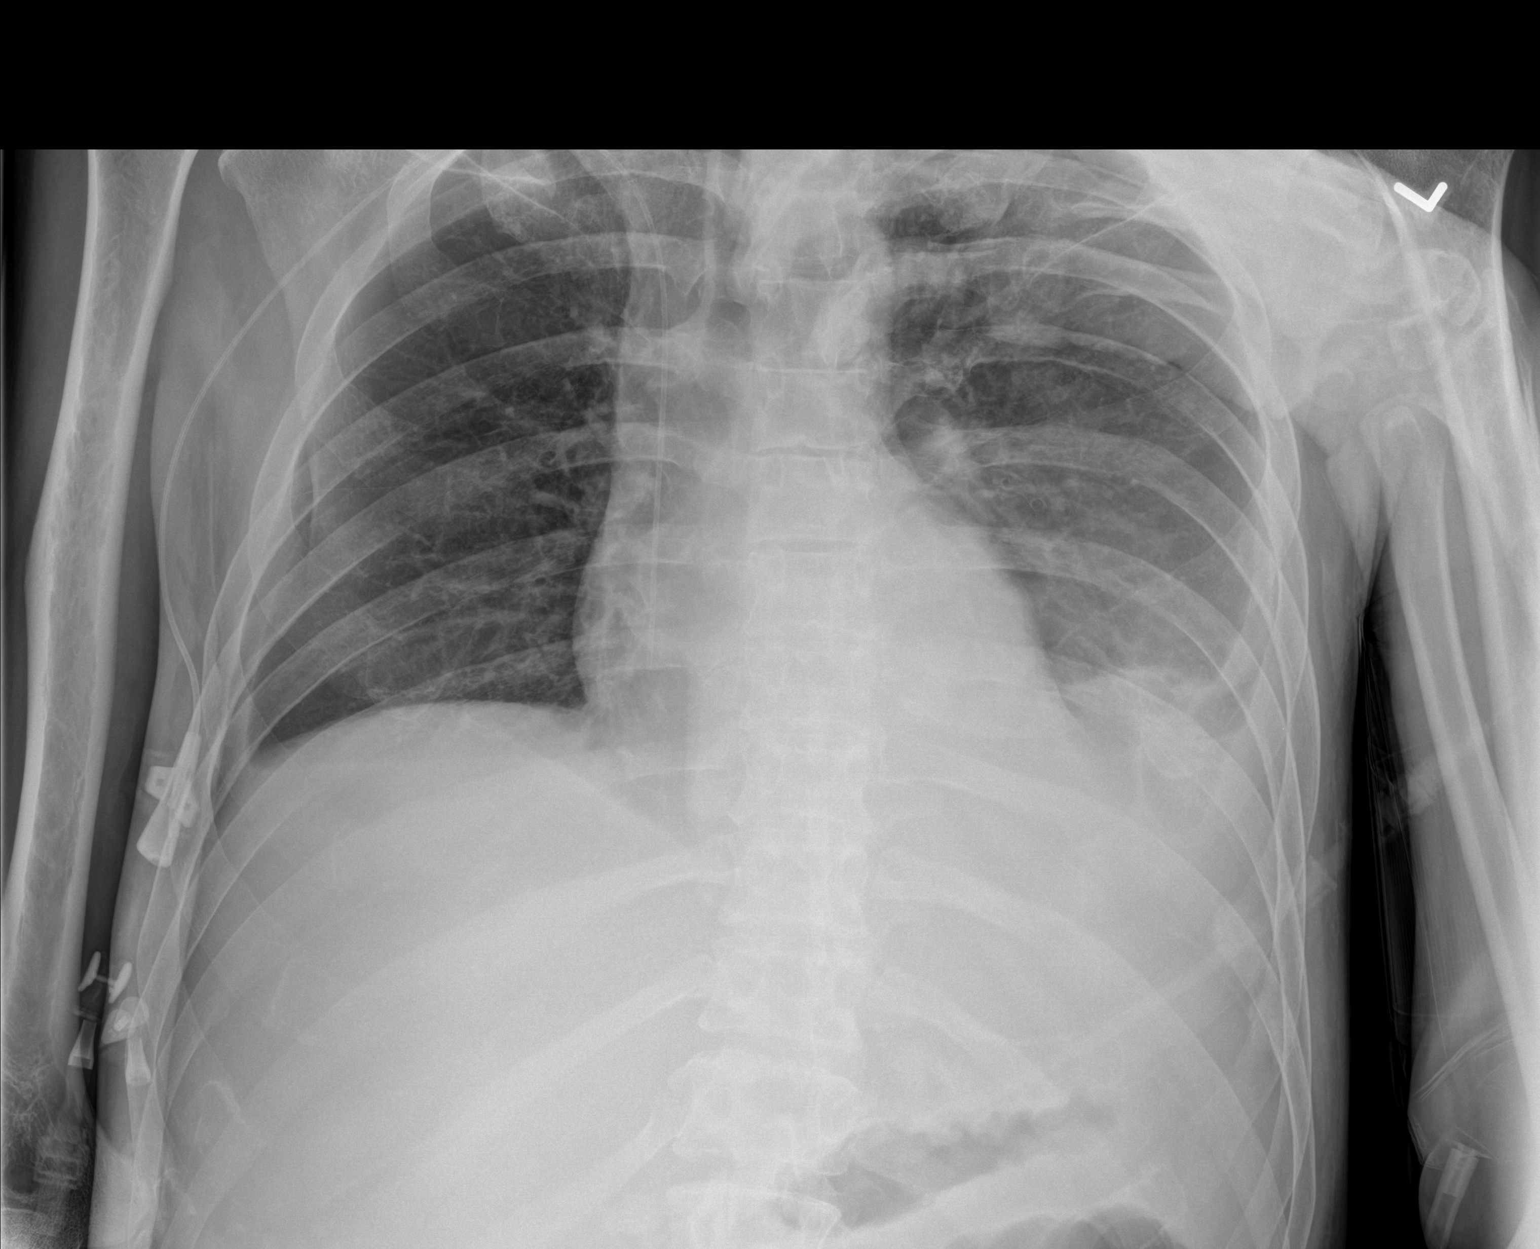

[1 of 1 positions shown; findings below may reference images not displayed]

FINDINGS: Cardiac shadow is stable. Increasing right-sided pleural effusion
and likely underlying atelectasis is present. New right-sided PICC
line is noted with the tip in the mid right atrium. This could be
withdrawn as much as 3 cm as necessary. Right lung is clear. Small
right effusion is noted. No bony abnormality is seen.
IMPRESSION: Bilateral pleural effusions left greater than right. Associated left
basilar atelectasis/infiltrate is present.

PICC line as described in the mid right atrium. This could be
withdrawn as many as 3 cm as clinically indicated.

## 2020-05-15 IMAGING — US US PARACENTESIS
2 series · 13 of 14 positions shown · non-contrast
Comparison: CT abdomen pelvis-[DATE];

INDICATION: History of alcoholic cirrhosis, now with symptomatic ascites. Please
perform ultrasound-guided paracentesis for therapeutic purposes.

EXAM:
ULTRASOUND-GUIDED PARACENTESIS
TECHNIQUE: Informed written consent was obtained from the patient after a
discussion of the risks, benefits and alternatives to treatment. A
timeout was performed prior to the initiation of the procedure.

[Series 1: us paracentesis · 0.26mm/px · 7 of 8 slices shown (1 of 2)]
[im 1/8]
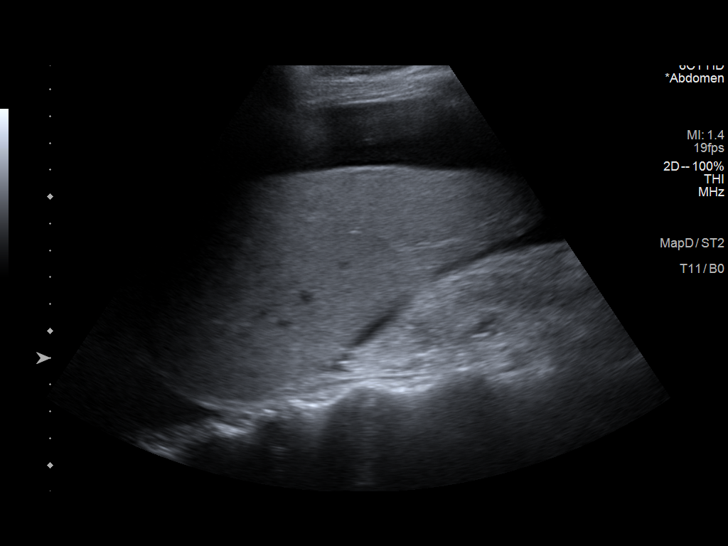
[im 2/8]
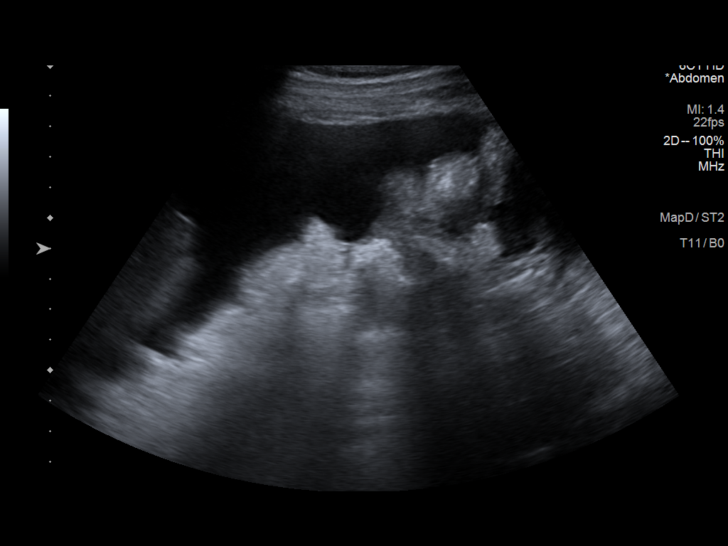
[im 3/8]
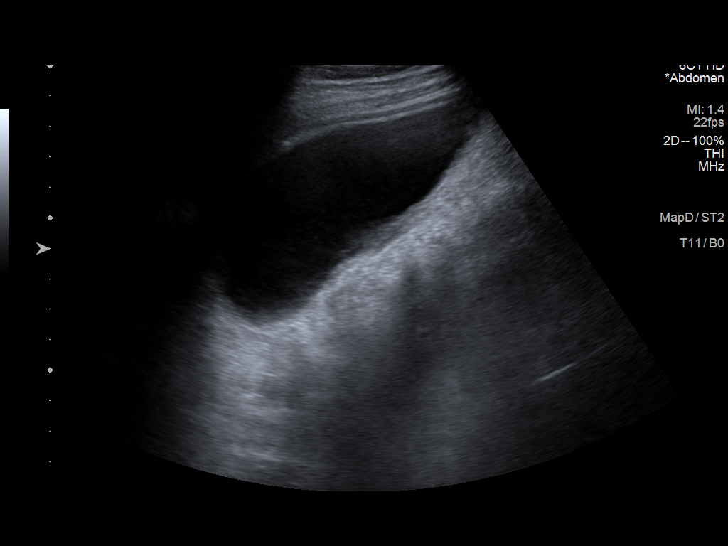
[im 4/8]
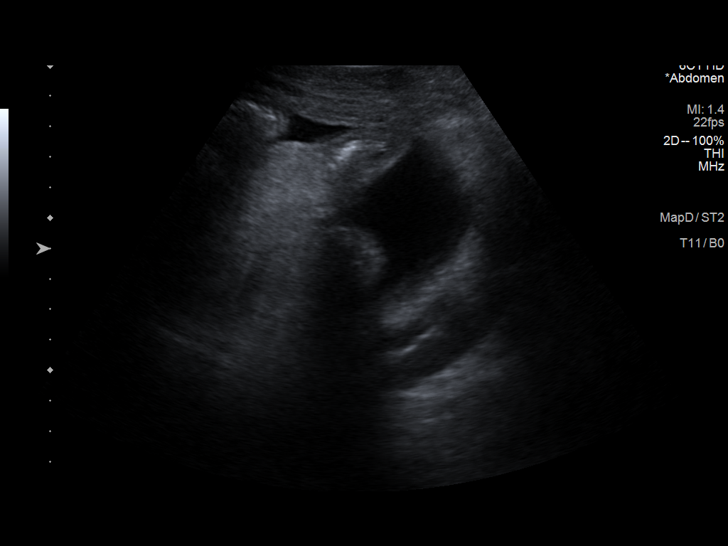
[im 5/8]
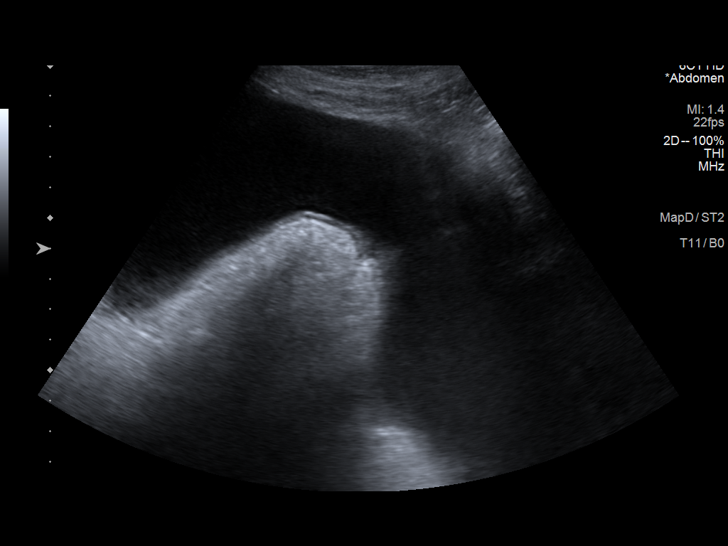
[im 6/8]
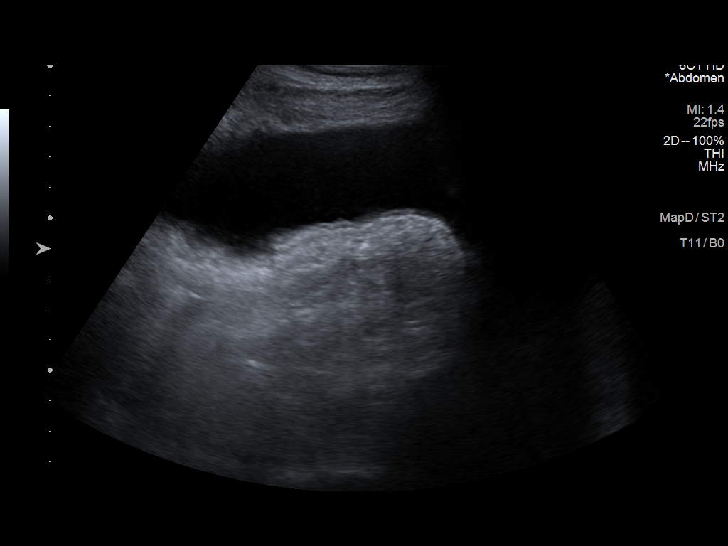
[im 8/8]
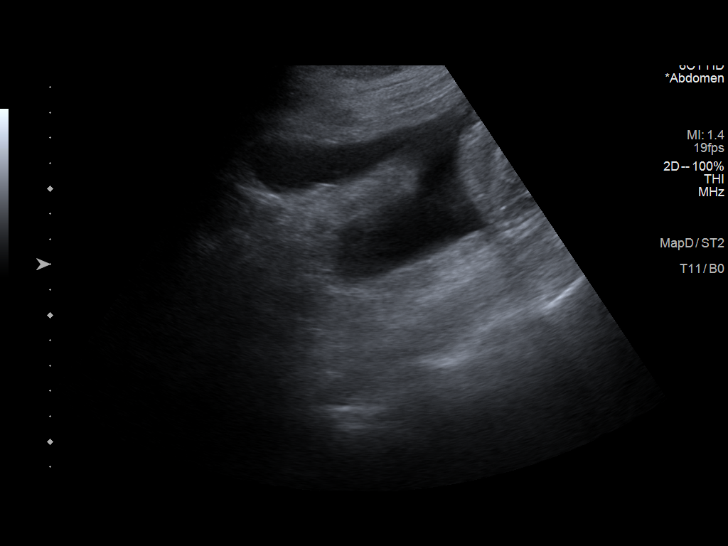

[Series 3: us paracentesis · 0.26mm/px · 6 of 6 slices shown (2 of 2)]
[im 1/6]
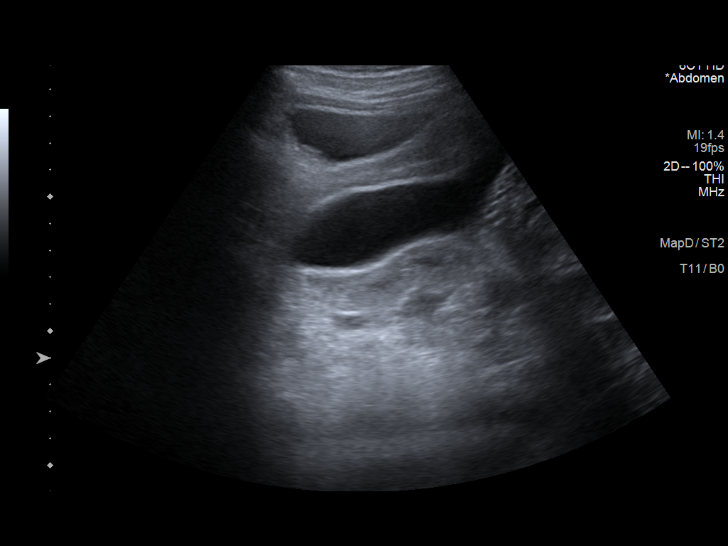
[im 2/6]
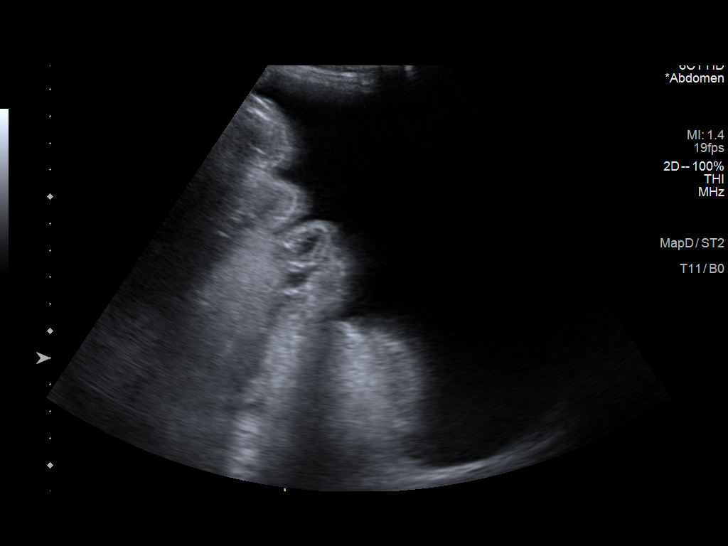
[im 3/6]
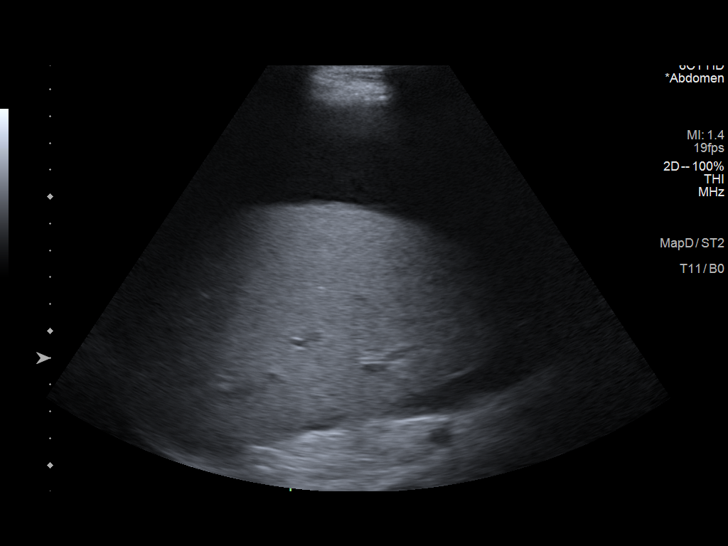
[im 4/6]
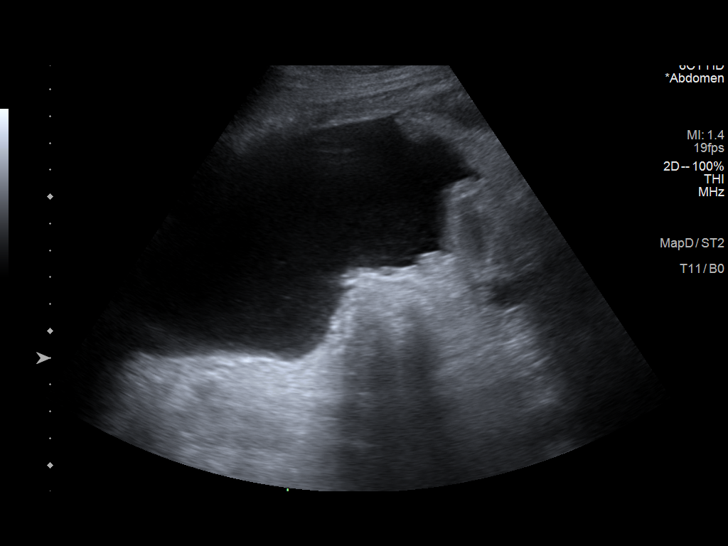
[im 5/6]
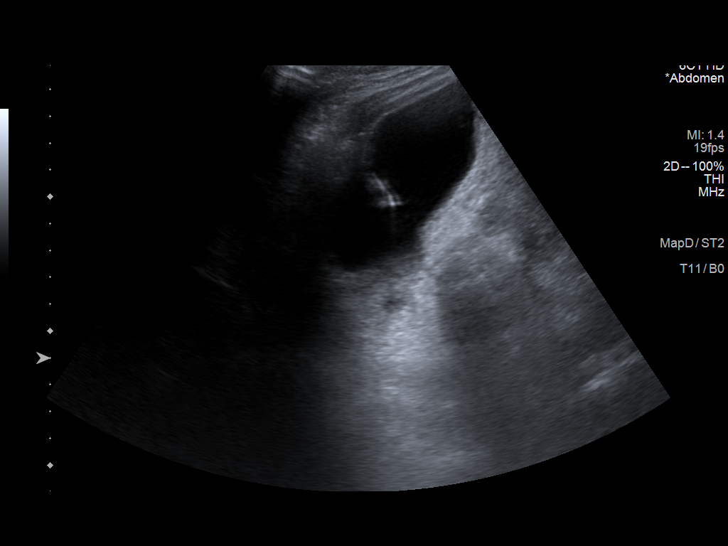
[im 6/6]
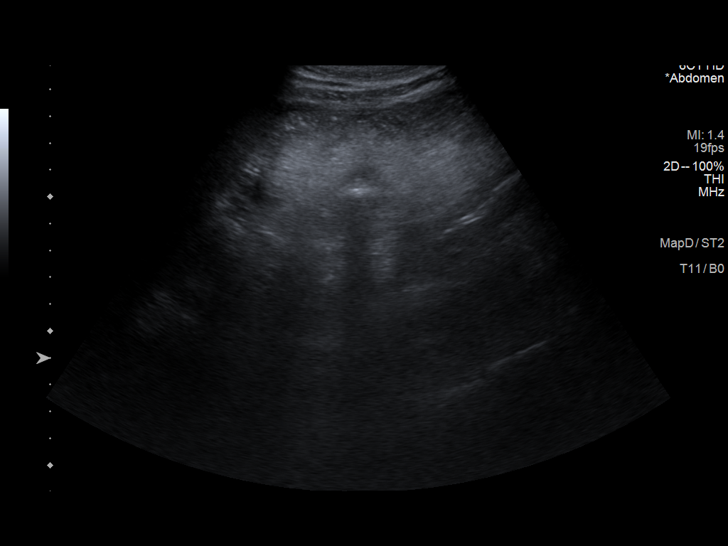

[13 of 14 positions shown; findings below may reference images not displayed]

ultrasound-guided
paracentesis performed [DATE] (yielding 2.8 L of peritoneal
fluid)

MEDICATIONS:
None.

COMPLICATIONS:
None immediate.
Initial ultrasound scanning demonstrates a moderate amount of
ascites within the right lower abdomen which was subsequently
prepped and draped in the usual sterile fashion. 1% lidocaine with
epinephrine was used for local anesthesia. Under direct ultrasound
guidance, a 19 gauge, 7-cm, Yueh catheter was introduced. An
ultrasound image was saved for documentation purposed. The
paracentesis was performed. The catheter was removed and a dressing
was applied. The patient tolerated the procedure well without
immediate post procedural complication.
FINDINGS: A total of approximately 3 liters of dark serous ascitic fluid was
removed.
IMPRESSION: Successful ultrasound-guided paracentesis yielding 3 liters of
peritoneal fluid.

## 2020-05-15 MED ORDER — HYDRALAZINE HCL 50 MG PO TABS: 25.0000 mg | ORAL_TABLET | Freq: Four times a day (QID) | ORAL | Status: DC | PRN

## 2020-05-15 MED ORDER — CARVEDILOL 3.125 MG PO TABS: 3.1250 mg | ORAL_TABLET | Freq: Two times a day (BID) | ORAL | Status: DC

## 2020-05-15 MED ORDER — LORAZEPAM 2 MG/ML IJ SOLN
1.0000 mg | Freq: Once | INTRAMUSCULAR | Status: AC
  Administered 2020-05-15: 1 mg via INTRAVENOUS
  Filled 2020-05-15: qty 1

## 2020-05-15 MED ORDER — SODIUM CHLORIDE 0.9 % IV SOLN
1.0000 g | Freq: Three times a day (TID) | INTRAVENOUS | Status: AC
  Administered 2020-05-16 – 2020-05-24 (×27): 1 g via INTRAVENOUS
  Filled 2020-05-15 (×29): qty 1

## 2020-05-15 MED ORDER — ALBUMIN HUMAN 25 % IV SOLN
50.0000 g | Freq: Once | INTRAVENOUS | Status: AC
  Administered 2020-05-16: 50 g via INTRAVENOUS
  Filled 2020-05-15: qty 150
  Filled 2020-05-15: qty 200

## 2020-05-15 MED ORDER — CALCIUM GLUCONATE-NACL 1-0.675 GM/50ML-% IV SOLN
1.0000 g | Freq: Once | INTRAVENOUS | Status: AC
  Administered 2020-05-15: 1000 mg via INTRAVENOUS
  Filled 2020-05-15: qty 50

## 2020-05-15 MED ORDER — LABETALOL HCL 5 MG/ML IV SOLN
10.0000 mg | INTRAVENOUS | Status: DC | PRN
  Administered 2020-05-15: 19:00:00 10 mg via INTRAVENOUS

## 2020-05-15 MED ORDER — GERHARDT'S BUTT CREAM
TOPICAL_CREAM | Freq: Three times a day (TID) | CUTANEOUS | Status: DC
  Administered 2020-05-16 – 2020-05-27 (×5): 1 via TOPICAL
  Filled 2020-05-15 (×2): qty 1

## 2020-05-15 NOTE — Consult Note (Signed)
ANTICOAGULATION CONSULT NOTE  Pharmacy Consult for Heparin infusion  Indication: cerebral venous thrombosis  Allergies  Allergen Reactions   Sudafed [Pseudoephedrine]     History per pt of this being linked with his seizures   Patient Measurements: Height: 6\' 1"  (185.4 cm) Weight: 63.5 kg (140 lb) IBW/kg (Calculated) : 79.9  Vital Signs: Temp: 98 F (36.7 C) (07/06 0004) Temp Source: Oral (07/05 1853) BP: 132/95 (07/06 0004) Pulse Rate: 83 (07/06 0004)  Labs: Recent Labs     0000 05/13/20 0618 05/13/20 0910 05/13/20 1736 05/14/20 0040 05/14/20 0040 05/14/20 1004 05/14/20 1804 05/15/20 0205  HGB   < > 10.5*  --   --  10.1*  --   --   --  10.3*  HCT  --  30.3*  --   --  28.6*  --   --   --  28.8*  PLT  --  246  --   --  185  --   --   --  191  APTT  --   --  30  --   --   --   --   --   --   LABPROT  --   --  13.8  --  13.8  --   --   --   --   INR  --   --  1.1  --  1.1  --   --   --   --   HEPARINUNFRC  --   --   --    < > 0.20*   < > 0.70 <0.10* 0.32  CREATININE  --  0.56*  --   --  0.66  --   --   --  0.78   < > = values in this interval not displayed.    Estimated Creatinine Clearance: 116.9 mL/min (by C-G formula based on SCr of 0.78 mg/dL).   Medical History: Past Medical History:  Diagnosis Date   Alcohol use disorder, severe, dependence (HCC)    Assessment: Pharmacy consulted for heparin infusion dosing and monitoring for 34 yo male for cerebral venous thrombosis.   CT Head: "Confirmed thrombus within the confluence of sinuses and left transverse dural venous sinus. 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 No evidence of hemorrhagic conversion."   07/05 @ 0040 HL 0.20 subtherapeutic - rate increased to 1150 units/hr  07/05 @ 1004 HL 0.70 supratherapeutic - rate decreased to 1000 units/hr 07/05 @ 1804 HL < 0.10 subtherapeutic 07/06 @ 0205 HL 0.32, therapeutic x 1  Goal of Therapy:  Heparin level 0.3-0.5 units/ml Monitor  platelets by anticoagulation protocol: Yes    Plan:  Desired HL of 0.3-0.5 units/ml.   HL is therapeutic, will continue rate at 1100 units/hr and will recheck HL in 6 hrs per protocol to confirm   09/06, PharmD Clinical Pharmacist 05/15/2020 4:12 AM

## 2020-05-15 NOTE — Consult Note (Signed)
ANTICOAGULATION CONSULT NOTE  Pharmacy Consult for Heparin infusion  Indication: cerebral venous thrombosis  Allergies  Allergen Reactions  . Sudafed [Pseudoephedrine]     History per pt of this being linked with his seizures   Patient Measurements: Height: 6\' 1"  (185.4 cm) Weight: 63.5 kg (140 lb) IBW/kg (Calculated) : 79.9  Vital Signs: Temp: 97.7 F (36.5 C) (07/06 0814) BP: 141/115 (07/06 1156) Pulse Rate: 78 (07/06 1156)  Labs: Recent Labs     0000 05/13/20 0618 05/13/20 0910 05/13/20 1736 05/14/20 0040 05/14/20 1004 05/15/20 0205 05/15/20 0757 05/15/20 1447  HGB   < > 10.5*  --   --  10.1*  --  10.3*  --   --   HCT  --  30.3*  --   --  28.6*  --  28.8*  --   --   PLT  --  246  --   --  185  --  191  --   --   APTT  --   --  30  --   --   --   --   --   --   LABPROT  --   --  13.8  --  13.8  --   --   --   --   INR  --   --  1.1  --  1.1  --   --   --   --   HEPARINUNFRC  --   --   --    < > 0.20*   < > 0.32 0.54 0.54  CREATININE  --  0.56*  --   --  0.66  --  0.78  --   --    < > = values in this interval not displayed.    Estimated Creatinine Clearance: 116.9 mL/min (by C-G formula based on SCr of 0.78 mg/dL).   Medical History: Past Medical History:  Diagnosis Date  . Alcohol use disorder, severe, dependence (HCC)    Assessment: Pharmacy consulted for heparin infusion dosing and monitoring for 34 yo male for cerebral venous thrombosis.   CT Head: "Confirmed thrombus within the confluence of sinuses and left transverse dural venous sinus. 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 No evidence of hemorrhagic conversion."   07/05 @ 0040 HL 0.20 subtherapeutic - rate increased to 1150 units/hr  07/05 @ 1004 HL 0.70 supratherapeutic - rate decreased to 1000 units/hr 07/05 @ 1804 HL < 0.10 subtherapeutic 07/06 @ 0205 HL 0.32, therapeutic x 1 7/6 @ 0757 0.54. Level supratherapeutic. Rate decreased to 1000 units/hr.   Goal of  Therapy:  Heparin level 0.3-0.5 units/ml Monitor platelets by anticoagulation protocol: Yes   Plan:  Desired HL of 0.3-0.5 units/ml.   7/6 @ 0.54. Level still supratherapeutic. Will lower infusion rate to 900 units/hr.  Recheck HL in 6 hours. CBCs daily while on heparin infusion per protocol.   9/6, PharmD, BCPS Clinical Pharmacist 05/15/2020 3:23 PM

## 2020-05-15 NOTE — Consult Note (Signed)
ANTICOAGULATION CONSULT NOTE  Pharmacy Consult for Heparin infusion  Indication: cerebral venous thrombosis  Allergies  Allergen Reactions  . Sudafed [Pseudoephedrine]     History per pt of this being linked with his seizures   Patient Measurements: Height: 6\' 1"  (185.4 cm) Weight: 63.5 kg (140 lb) IBW/kg (Calculated) : 79.9  Vital Signs: Temp: 97.7 F (36.5 C) (07/06 0814) BP: 114/100 (07/06 0814) Pulse Rate: 77 (07/06 0814)  Labs: Recent Labs     0000 05/13/20 0618 05/13/20 0910 05/13/20 1736 05/14/20 0040 05/14/20 1004 05/14/20 1804 05/15/20 0205 05/15/20 0757  HGB   < > 10.5*  --   --  10.1*  --   --  10.3*  --   HCT  --  30.3*  --   --  28.6*  --   --  28.8*  --   PLT  --  246  --   --  185  --   --  191  --   APTT  --   --  30  --   --   --   --   --   --   LABPROT  --   --  13.8  --  13.8  --   --   --   --   INR  --   --  1.1  --  1.1  --   --   --   --   HEPARINUNFRC  --   --   --    < > 0.20*   < > <0.10* 0.32 0.54  CREATININE  --  0.56*  --   --  0.66  --   --  0.78  --    < > = values in this interval not displayed.    Estimated Creatinine Clearance: 116.9 mL/min (by C-G formula based on SCr of 0.78 mg/dL).   Medical History: Past Medical History:  Diagnosis Date  . Alcohol use disorder, severe, dependence (HCC)    Assessment: Pharmacy consulted for heparin infusion dosing and monitoring for 34 yo male for cerebral venous thrombosis.   CT Head: "Confirmed thrombus within the confluence of sinuses and left transverse dural venous sinus. 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 No evidence of hemorrhagic conversion."   07/05 @ 0040 HL 0.20 subtherapeutic - rate increased to 1150 units/hr  07/05 @ 1004 HL 0.70 supratherapeutic - rate decreased to 1000 units/hr 07/05 @ 1804 HL < 0.10 subtherapeutic 07/06 @ 0205 HL 0.32, therapeutic x 1  Goal of Therapy:  Heparin level 0.3-0.5 units/ml Monitor platelets by anticoagulation  protocol: Yes    Plan:  Desired HL of 0.3-0.5 units/ml.   7/6 @ 0757 0.54. Level supratherapeutic. Will lower infusion rate to 1000 units/hr.  Recheck HL in 6 hours. CBCs daily while on heparin infusion per protocol.   9/6, PharmD, BCPS Clinical Pharmacist 05/15/2020 8:55 AM

## 2020-05-15 NOTE — Progress Notes (Signed)
Peripherally Inserted Central Catheter Placement  The IV Nurse has discussed with the patient and/or persons authorized to consent for the patient, the purpose of this procedure and the potential benefits and risks involved with this procedure.  The benefits include less needle sticks, lab draws from the catheter, and the patient may be discharged home with the catheter. Risks include, but not limited to, infection, bleeding, blood clot (thrombus formation), and puncture of an artery; nerve damage and irregular heartbeat and possibility to perform a PICC exchange if needed/ordered by physician.  Alternatives to this procedure were also discussed.  Bard Power PICC patient education guide, fact sheet on infection prevention and patient information card has been provided to patient /or left at bedside.    PICC Placement Documentation  PICC Double Lumen 05/15/20 PICC Right Basilic 38 cm 0 cm (Active)  Indication for Insertion or Continuance of Line Administration of hyperosmolar/irritating solutions (i.e. TPN, Vancomycin, etc.) 05/15/20 1807  Exposed Catheter (cm) 0 cm 05/15/20 1807  Site Assessment Clean;Dry;Intact 05/15/20 1807  Lumen #1 Status Flushed;Saline locked;Blood return noted 05/15/20 1807  Lumen #2 Status Flushed;Saline locked;Blood return noted 05/15/20 1807  Dressing Type Transparent;Securing device 05/15/20 1807  Dressing Status Clean;Dry;Intact;Antimicrobial disc in place 05/15/20 1807  Dressing Intervention New dressing 05/15/20 1807  Dressing Change Due 05/22/20 05/15/20 1807       Annett Fabian 05/15/2020, 6:10 PM

## 2020-05-15 NOTE — Consult Note (Signed)
Midge Minium, MD [facility]  [address], [address] [place], [place] [address] Phone: [phone] Fax : [phone]  Consultation  Referring Provider:     Dr. Denton Lank Primary Care Physician:  [name], MD Primary Gastroenterologist: Gentry Fitz         Reason for Consultation:     Consult for gastrostomy tube  Date of Admission:  05/03/2020 Date of Consultation:  05/15/2020         HPI:   Ethan Clayburn Sr. is a 34 y.o. male who has had a complicated course with him being in the hospital with a change in mental status chronic alcoholism and has been reported to be drinking a 12 pack of beer every day since the age of 80.  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  The patient was also found to have a moderate amount of ascites with fatty infiltration of the liver and bilateral pleural effusions. The patient had a CT scan of the head yesterday that showed a small infarct in the posterior right cerebellum and evidence of an infarct in the right posterior parietal lobe.  The patient's MRI from the third of this month showed appeared suspicious for dural venous sinus thrombosis. The patient is not able to give much history and most of my information is gotten from the chart and speaking to the patient's significant other. The patient has had continued issues with caloric intake and inability to feed the patient resulting in severe protein calorie malnutrition. The patient's lipase levels have continued to stay elevated and were over thousand today.  The patient's white cell count has also gone from 20.3 yesterday to 26.3 today.  Past Medical History:  Diagnosis Date  . Alcohol use disorder, severe, dependence (HCC)     History reviewed. No pertinent surgical history.  Prior to Admission medications   Not on File    Family History  Problem Relation Age of Onset  . Alcohol abuse  Mother      Social History   Tobacco Use  . Smoking status: Current Every Day Smoker  . Smokeless tobacco: Never Used  Substance Use Topics  . Alcohol use: Yes    Comment: 24 beers/day since age 20.  . Drug use: Not Currently    Allergies as of 05/03/2020 - Review Complete 05/03/2020  Allergen Reaction Noted  . Sudafed [pseudoephedrine]  05/03/2020    Review of Systems:    All systems reviewed and negative except where noted in HPI.   Physical Exam:  Vital signs in last 24 hours: Temp:  [97.7 F (36.5 C)-98 F (36.7 C)] 97.7 F (36.5 C) (07/06 0814) Pulse Rate:  [74-83] 78 (07/06 1156) Resp:  [17-20] 17 (07/06 1156) BP: (91-141)/(63-115) 141/115 (07/06 1156) SpO2:  [94 %-100 %] 100 % (07/06 1156) Last BM Date: 05/13/20 General:   Laying in bed appearing ill and unable to answer any questions Head:  Normocephalic and atraumatic. Eyes:   No icterus.   Conjunctiva pink. PERRLA. Ears:  Normal auditory acuity. Neck:  Supple; no masses or thyroidomegaly Lungs: Respirations even and unlabored. Lungs clear to auscultation bilaterally.   No wheezes, crackles, or rhonchi.  Heart:  Regular rate and rhythm;  Without murmur, clicks, rubs or gallops Abdomen:  Soft, distended, nontender. Normal bowel sounds. No appreciable masses or hepatomegaly.  No rebound or guarding.  Rectal:  Not performed. Msk:  Symmetrical without gross  deformities.    Extremities:  Without edema, cyanosis or clubbing. Neurologic: Unable to assess Skin:  Intact without significant lesions or rashes. Cervical Nodes:  No significant cervical adenopathy. Psych: Lethargic  LAB RESULTS: Recent Labs    05/13/20 0618 05/14/20 0040 05/15/20 0205  WBC 20.4* 20.3* 26.3*  HGB 10.5* 10.1* 10.3*  HCT 30.3* 28.6* 28.8*  PLT 246 185 191   BMET Recent Labs    05/13/20 0618 05/14/20 0040 05/15/20 0205  NA 135 135 136  K 3.8 3.4* 3.8  CL 101 99 103  CO2 26 28 24   GLUCOSE 113* 141* 132*  BUN 16 17 20    CREATININE 0.56* 0.66 0.78  CALCIUM 7.9* 7.3* 6.1*   LFT Recent Labs    05/15/20 0205  PROT 4.4*  ALBUMIN 1.5*  AST 39  ALT 25  ALKPHOS 81  BILITOT 0.7   PT/INR Recent Labs    05/13/20 0910 05/14/20 0040  LABPROT 13.8 13.8  INR 1.1 1.1    STUDIES: EEG  Result Date: 05/14/2020 07/15/20, MD     05/14/2020  2:01 PM ELECTROENCEPHALOGRAM REPORT Patient: Thana Farr Sr.       Room #: 109A-AA EEG No. ID: [id number] Age: 34 y.o.        Sex: male Requesting Physician: [id number] Report Date:  05/14/2020       Interpreting Physician: Denton Lank History: Keisuke Hollabaugh Sr. is an 34 y.o. male with altered mental status Medications: Heparin, Lipitor, COreg, Zosyn, Aldactone, Thiamine Conditions of Recording:  This is a 21 channel routine scalp EEG performed with bipolar and monopolar montages arranged in accordance to the international 10/20 system of electrode placement. One channel was dedicated to EKG recording. The patient is in the awake state. Description:  Artifact is prominent during the recording often obscuring the background rhythm due to a low voltage beta activity that is diffusely distributed.  When able to be visualized the background is slow and poorly organized and consists of activity in the delta theta continuum that is seen diffusely.  This activity is continuous throughout the recording. No epileptiform activity is noted. Hyperventilation was not performed.  Intermittent photic stimulation was performed but failed to illicit any change in the tracing. IMPRESSION: This is an abnormal EEG secondary to general background slowing.  This finding may be seen with a diffuse disturbance that is etiologically nonspecific, but may include a metabolic encephalopathy, among other possibilities.  No epileptiform activity is noted.  20, MD Neurology [phone] 05/14/2020, 1:56 PM   CT HEAD WO CONTRAST  Result Date: 05/14/2020 CLINICAL DATA:  34 year old male with encephalopathy  found to have dural venous sinus thrombosis and posterior circulation infarcts and/or postictal changes on recent MRI and CT venogram. ETOH abuse, initially presenting with DTs. EXAM: CT HEAD WITHOUT CONTRAST TECHNIQUE: Contiguous axial images were obtained from the base of the skull through the vertex without intravenous contrast. COMPARISON:  CTP and head MRI 05/12/2020. head CT 05/03/2020. FINDINGS: Brain: Mild hypodensity in the posterior right cerebellum corresponding to the diffusion abnormality compatible with small infarct. No hemorrhage or mass effect (series 4, image 54). There is also subtle hypodensity in the posterior right parietal lobe corresponding to some of the other abnormal diffusion along the posterior cerebral hemispheres (series 2, image 23). No acute intracranial hemorrhage identified. No midline shift, mass effect, or evidence of intracranial mass lesion. No ventriculomegaly. Generalized cerebral volume loss again noted. No other abnormal gray-white matter differentiation. Vascular: No suspicious intracranial vascular hyperdensity. Patency  of the torcula and left transverse sinus not evaluated in the absence of contrast. Skull: No acute osseous abnormality identified. Sinuses/Orbits: Visualized paranasal sinuses and mastoids are stable and well pneumatized. Other: Visualized orbits and scalp soft tissues are within normal limits. IMPRESSION: 1. Expected CT appearance of small infarct in the posterior right cerebellum. And subtle evidence of infarct in the posterior right parietal lobe, making postictal explanation of the DWI abnormality there less likely. 2. No associated hemorrhage or mass effect. 3. Patency of the torcula and left transverse sinus not evaluated in the absence of contrast. 4. No new intracranial abnormality. Electronically Signed   By: Odessa Fleming M.D.   On: 05/14/2020 12:45   [place] EKG SITE RITE  Result Date: 05/15/2020 If Site Rite image not attached, placement could not be  confirmed due to current cardiac rhythm.     Impression / Plan:   Assessment: Principal Problem:   Acute alcoholic pancreatitis Active Problems:   Alcohol withdrawal seizure with delirium (HCC)   Hyponatremia   Hypokalemia   AKI (acute kidney injury) (HCC)   Elevated CK   Heme positive stool   Thrombocytopenia (HCC)   Acute metabolic encephalopathy   Severe protein-calorie malnutrition (HCC)   Sinus tachycardia   Hondo Nanda Sr. is a 34 y.o. y/o male with a long history of alcohol abuse with multiple medical issues at the present time including alcoholic pancreatitis with alcohol withdrawal severe malnutrition and pseudocysts.  Patient also appears to have had vascular changes on a brain MRI.  I am now asked to see the patient for possible insertion of a gastrostomy tube.  This is being considered due to his poor nutrition.  Plan:  This patient has a contraindication to any percutaneous feeding tube placement due to his ascites.  This would lead to a very high risk of drainage around the PEG tube site and a place for peritonitis to develop.  There would also be no union of the abdominal wall to the gastric wall due to the presence of the ascites.  I would recommend the patient have a Dobbhoff or NG tube used to feed him.  Patient with moderate to severe pancreatitis should use the GI track to avoid translocation of bacteria across the intestinal wall turning a sterile pancreatitis to an infected pancreatitis.   As for his slowly resolving pancreatitis with his increased pancreatic enzymes I suggest to continue conservative management and if he does not start to improve over the next few days then a repeat imaging of his pancreas to rule out necrosis or abscess would be warranted.  Thank you for involving me in the care of this patient.      LOS: 12 days   Midge Minium, MD  05/15/2020, 5:05 PM Pager [phone] 7am-5pm  Check AMION for 5pm -7am coverage and on weekends   Note: This  dictation was prepared with Dragon dictation along with smaller phrase technology. Any transcriptional errors that result from this process are unintentional.

## 2020-05-15 NOTE — Progress Notes (Signed)
PROGRESS NOTE    Shane Correll Sr.   YCX:[id number]  DOB: 1986/02/24  PCP: [name], MD    DOA: 05/03/2020 LOS: 39   Brief Narrative   34 year old gentleman with no medical history, chronic alcoholism, has been drinking nonstop about 12 packs of beer daily since age of 34 presented to the ED on  05/03/2020 for evaluation of progressive confusion and lethargy.  He also had episode of unresponsiveness in the triage area.  As per patient's fianc, patient stopped drinking about 24 hours prior to arrival and started behaving altered, confused.  Also reported multiple episodes diarrhea, but no nausea or vomiting.    In the ED, he was ill-appearing, BP stable, tachycardic with heart rate 141, respirations 21, and afebrile.  Labs were notable for sodium 124, potassium was 2.8, creatinine 1.75, bilirubin 2.  Anion gap 19.  CK 700.  Chest x-ray was essentially normal.  CT head was normal.  CT abdomen pelvis was consistent with pancreatitis with peripancreatic edema and multiple lesions that are most likely pseudocysts, moderate ascites with mild fatty  infiltration of liver, small BILATERAL pleural effusions and minimal bibasilar atelectasis (other non-pertinent findings in report).   Admitted to the hospitalist service for further evaluation and management on acute alcohol withdrawal syndrome, acute pancreatitis and possible SBP (later ruled out).          Assessment & Plan   Principal Problem:   Acute alcoholic pancreatitis Active Problems:   Acute metabolic encephalopathy   Severe protein-calorie malnutrition (HCC)   Sinus tachycardia   Alcohol withdrawal seizure with delirium (HCC)   Hyponatremia   Hypokalemia   AKI (acute kidney injury) (HCC)   Elevated CK   Heme positive stool   Thrombocytopenia (HCC)   Severe Protein Calorie Malnutrition - POA, due to chronic alcohol abuse and not eating regular food.  Minimal PO intake during admission, but diet started on 7/3.  Patient  tolerating but not taking in enough calories yet.  Dietician consulted.  May require tube feeding soon, recommendations have been made.  Daily CMP, Mg, Phos.  High risk of refeeding syndrome. Due to ascites, only option for enteral feeding is naso-enteric post-pyloric Dobhoff (pt likely would require restraints).  PICC line placed for short-term TPN while goals of care are considered.  Goals of care - had discussion 7/5 with wife and her mother at bedside regarding unclear prognosis at this time.  They agree to palliative consult.  Do want to pursue artificial feeding.  Otherwise continue full scope of care at this time.  Palliative consult pending.  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  Neurology following.  Heparin infusion.  Neuro-checks.  Repeat head CT 7/5 ruled out hemorrhage or hydrocephalus.  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  ASA and statin started.  Neurology following.  Repeat CT 7/5 as above.  Generalized weakness - severe.  Due to chronic alcoholism and malnutrition as well as prolonged acute illness.  He is requiring heavy assistance with bed mobility and standing.   PT recommends SNF.  TOC consulted.    Acute metabolic encephalopathy - waxes and wanes, having hallucinations, agitation at times.  Lethargy has resolved.  No longer receiving benzos for alcohol withdrawal.  CT head upon admission was negative  for acute findings that showed generalized atrophy advanced for his age.  Exam is non-focal.  Ammonia level normal x 2.  B12 normal.  UDS on admission was never done, please collect and process.  Neurology consulted, appreciate input.  EEG consistent with encephalopathy, no epileptiform activity.   Mild TSH elevation but normal T4.   Repeat head CT today.  Follow up thiamine level.    Alcohol withdrawal syndrome - present on admission Alcohol dependence / Alcohol use disorder Completed Librium taper.  Stop CIWA protocol.  Continue thiamine and folic acid.  Recommend inpatient rehab if patient amenable, however he is too weak to ambulate, would need to be stronger and independent.  Advise intensive psychosocial support after discharge.  Patient agreeable to medication for AUD, recommend starting naltrexone 50 mg PO daily prior to discharge if no contraindications.  Acute alcoholic pancreatitis -present on admission with abdominal pain, tachycardia, diarrhea, hypotension.  No gallbladder abnormalities on CT abdomen.  No longer complains of abdominal pain, only distention.    Lipase a little better but still > 1500 today.  White count up from 20 to 26k as well, concerning for infected necrosis.  Continue Zosyn.  Monitor abdominal exam closely. Continue supportive care.  Regular diet as tolerated. With worsening abdominal distention, surgery was consulted.  Intra-abdominal pressure monitoring showed intra-abdominal pressure less than 20 with no hemodynamic instability. No endorgan damage. Surgery recommends management of ascites and pancreatitis.  Distention stable with diuretics.    Hematuria - new 7/3, in addition to pt reporting dysuria.  UA negative.  Appears resolved at this time.  Diarrhea - present on admission, persistent.  No fevers and leukocytosis likely due to pancreatitis, very unlikely infectious. Suspect malabsorption due to pancreatitis.  Imodium PRN.  Leukocytosis - worsening.  Likely due to pancreatitis, no other evidence of infection.  UA negative.  No respiratory symptoms.  Monitor closely.  Did start empiric Zosyn today (7/4) given persistent high wbc, concern for developing pancreatic infection or SBP.  Follow blood cultures, no growth so far.  Sinus tachycardia - resolved after starting empiric  antibiotics.  Had been persistently elevated HR despite being past alcohol withdrawal and pain better from pancreatitis.  Ascites fluid cultures negative to date.  Started low dose of Coreg for BP and HR control.  Continue to manage underlying issues as above.  Blood cultures negative to date.  No respiratory or urinary symptoms.  7/6 - worsening leukocytosis, concern for infected necrosis on pancreas.  On Zosyn.  Ascites - his liver has mild fatty infiltration on imaging, not cirrhotic at this point, this is likely due to pancreatitis and hypoalbuminemia.  Underwent paracentesis on 6/28 with 2.8 L fluid removed. Ordered repeat paracentesis (7/1) due to increased distention, but not enough fluid seen on ultrasound to do safely.  Large volume ascites seen on MRI abdomen (7/3), so will try US paracentesis again, pending.  Re-culture ascites fluid to r/o SBP.  Continue Lasix and spironolactone.    Elevated TSH - free T4 normal.    Chest pain - Resolved.  Reported by pt on AM of 7/1.  Troponin mildly elevated at 32, likely demand ischemia from persistent tachycardia in setting of alcohol withdrawal.  No acute ischemic changes on EKG, only non-specific T wave changes.  Monitor.  Hypervolemic hyponatremia -POA with sodium 124.  Suspect due to liver disease.  Monitor BMP.  Improving with diuretics.  Na 134.  Acute renal failure -Resolved.  Present on admission with creatinine 1.75.  Likely  prerenal azotemia in the setting of above.  Improved with IV hydration.  Monitor BMP and urine output.  Hypokalemia -present on admission, replaced.  Monitor BMP, Mg, Phos and replace as needed.    Patient BMI: Body mass index is 18.47 kg/m.   DVT prophylaxis: SCDs Start: 05/03/20 2153   Diet:  Diet Orders (From admission, onward)    Start     Ordered   05/15/20 1035  Diet regular Room service appropriate? Yes; Fluid consistency: Thin  Diet effective now       Comments: Please give 3 milk cartons with  each meal.  Question Answer Comment  Room service appropriate? Yes   Fluid consistency: Thin      05/15/20 1035            Code Status: Full Code    Subjective 05/15/20    Patient seen this AM, wife at bedside.  Patient is little more talkative today, very soft weak voice.  He was drinking milk.  Declined eating peaches or other food on his tray.     Disposition Plan & Communication   Status is: Inpatient  Remains inpatient appropriate because:Inpatient level of care appropriate due to severity of illness   Dispo: The patient is from: Home              Anticipated d/c is to: SNF              Anticipated d/c date is: > 3 days              Patient currently is not medically stable to d/c.   Family Communication: wife at bedside during encounter    Consults, Procedures, Significant Events   Consultants:   Neurology  Gastroenterology  General surgery  Palliative care   Procedures:   Paracentesis 05/07/2020  Antimicrobials:   Meropenem (6/28 to 7/1)  Zosyn 7/4 >>    Objective   Vitals:   05/14/20 1959 05/15/20 0004 05/15/20 0814 05/15/20 1156  BP: 96/71 (!) 132/95 (!) 114/100 (!) 141/115  Pulse: 79 83 77 78  Resp: Temp:  98 F (36.7 C) 97.7 F (36.5 C)   TempSrc:      SpO2: 94% 100% 98% 100%  Weight:      Height:        Intake/Output Summary (Last 24 hours) at 05/15/2020 1605 Last data filed at 05/15/2020 0900 Gross per 24 hour  Intake 120 ml  Output --  Net 120 ml   Filed Weights   05/03/20 2232  Weight: 63.5 kg    Physical Exam:  General exam: awake, alert, no acute distress, underweight, chronically ill appearing HEENT: very dry mucus membranes, hearing grossly normal, pale lips  Respiratory system: CTAB but inspiration is somewhat shallow, normal respiratory effort, no wheezes or rhonchi. Cardiovascular system: normal S1/S2, RRR, no lower extremity edema.   Gastrointestinal system: still distended but not tender,  active bowel sounds. Central nervous system: Alert, oriented x 3, very soft vocal projection. No gross focal neurologic deficits.   Extremities: edematous feet with some erythema on the heels, trace edema on anterior LE's, no cyanosis Skin: dry, intact, normal temperature, pale Psychiatric: normal mood with periods of anxiety, congruent affect  Labs   Data Reviewed: I have personally reviewed following labs and imaging studies  CBC: Recent Labs  Lab 05/10/20 0520 05/10/20 0520 05/11/20 0603 05/12/20 0557 05/13/20 0618 05/14/20 0040 05/15/20 0205  WBC 18.6*   < > 19.5* 19.4*  20.4* 20.3* 26.3*  NEUTROABS 15.3*  --  15.9* 16.4* 17.5*  --   --   HGB 10.7*   < > 11.1* 10.4* 10.5* 10.1* 10.3*  HCT 30.1*   < > 31.2* 29.5* 30.3* 28.6* 28.8*  MCV 93.5   < > 92.0 92.5 94.4 93.2 90.6  PLT 205   < > 219 239 246 185 191   < > = values in this interval not displayed.   Basic Metabolic Panel: Recent Labs  Lab 05/10/20 0520 05/10/20 0520 05/11/20 0603 05/12/20 0557 05/13/20 0618 05/14/20 0040 05/15/20 0205  NA 132*   < > 134* 134* 135 135 136  K 3.6   < > 3.4* 3.1* 3.8 3.4* 3.8  CL 98   < > 95* 97* 101 99 103  CO2 25   < > GLUCOSE 115*   < > 117* 111* 113* 141* 132*  BUN 9   < > CREATININE 0.54*   < > 0.60* 0.65 0.56* 0.66 0.78  CALCIUM 7.8*   < > 7.8* 7.8* 7.9* 7.3* 6.1*  MG 1.5*   < > 1.7 1.6* 1.7 2.0 1.8  PHOS 3.8  --  3.8 4.3 4.3  --  3.7   < > = values in this interval not displayed.   GFR: Estimated Creatinine Clearance: 116.9 mL/min (by C-G formula based on SCr of 0.78 mg/dL). Liver Function Tests: Recent Labs  Lab 05/11/20 0603 05/12/20 0557 05/13/20 0618 05/14/20 0040 05/15/20 0205  AST 50* 31 34 34 39  ALT ALKPHOS 134* 104 95 87 81  BILITOT 0.9 1.2 0.8 0.7 0.7  PROT 4.6* 4.5* 4.7* 4.4* 4.4*  ALBUMIN 1.6* 1.6* 1.7* 1.6* 1.5*   Recent Labs  Lab 05/10/20 0520 05/13/20 0618 05/14/20 0040 05/15/20 0205  LIPASE  825* 1,543* 1,510* 1,141*   Recent Labs  Lab 05/09/20 1544 05/12/20 1117  AMMONIA 24 <9*   Coagulation Profile: Recent Labs  Lab 05/13/20 0910 05/14/20 0040  INR 1.1 1.1   Cardiac Enzymes: No results for input(s): CKTOTAL, CKMB, CKMBINDEX, TROPONINI in the last 168 hours. BNP (last 3 results) No results for input(s): PROBNP in the last 8760 hours. HbA1C: No results for input(s): HGBA1C in the last 72 hours. CBG: Recent Labs  Lab 05/13/20 2127 05/14/20 0849 05/14/20 1655 05/14/20 2112 05/15/20 0823  GLUCAP 127* 177* 122* 156* 133*   Lipid Profile: No results for input(s): CHOL, HDL, LDLCALC, TRIG, CHOLHDL, LDLDIRECT in the last 72 hours. Thyroid Function Tests: No results for input(s): TSH, T4TOTAL, FREET4, T3FREE, THYROIDAB in the last 72 hours. Anemia Panel: No results for input(s): VITAMINB12, FOLATE, FERRITIN, TIBC, IRON, RETICCTPCT in the last 72 hours. Sepsis Labs: No results for input(s): PROCALCITON, LATICACIDVEN in the last 168 hours.  Recent Results (from the past 240 hour(s))  Body fluid culture     Status: None   Collection Time: 05/07/20 12:00 PM   Specimen: PATH Cytology Peritoneal fluid  Result Value Ref Range Status   Specimen Description   Final    PERITONEAL Performed at [facility], [address] [place], [place] [address]    Special Requests   Final    PERITONEAL Performed at [facility], [address] [place], [place] [address]    Gram Stain   Final    WBC PRESENT,BOTH PMN AND MONONUCLEAR NO ORGANISMS SEEN CYTOSPIN SMEAR    Culture  Final    NO GROWTH 3 DAYS Performed at [facility], [address] [place], [place] [address]    Report Status 05/11/2020 FINAL  Final  Culture, blood (single) w Reflex to ID Panel     Status: None (Preliminary result)   Collection Time: 05/11/20  7:59 AM   Specimen: BLOOD  Result Value Ref Range Status   Specimen Description BLOOD LEFT WRIST  Final   Special  Requests   Final    BOTTLES DRAWN AEROBIC AND ANAEROBIC Blood Culture adequate volume   Culture   Final    NO GROWTH 4 DAYS Performed at [facility], [address] [place], [place] [address]    Report Status PENDING  Incomplete      Imaging Studies   EEG  Result Date: 05/14/2020 Thana Farr, MD     05/14/2020  2:01 PM ELECTROENCEPHALOGRAM REPORT Patient: Shane Sergeant Sr.       Room #: 109A-AA EEG No. ID: [id number] Age: 34 y.o.        Sex: male Requesting Physician: Denton Lank Report Date:  05/14/2020       Interpreting Physician: Thana Farr History: Shane Migues Sr. is an 34 y.o. male with altered mental status Medications: Heparin, Lipitor, COreg, Zosyn, Aldactone, Thiamine Conditions of Recording:  This is a 21 channel routine scalp EEG performed with bipolar and monopolar montages arranged in accordance to the international 10/20 system of electrode placement. One channel was dedicated to EKG recording. The patient is in the awake state. Description:  Artifact is prominent during the recording often obscuring the background rhythm due to a low voltage beta activity that is diffusely distributed.  When able to be visualized the background is slow and poorly organized and consists of activity in the delta theta continuum that is seen diffusely.  This activity is continuous throughout the recording. No epileptiform activity is noted. Hyperventilation was not performed.  Intermittent photic stimulation was performed but failed to illicit any change in the tracing. IMPRESSION: This is an abnormal EEG secondary to general background slowing.  This finding may be seen with a diffuse disturbance that is etiologically nonspecific, but may include a metabolic encephalopathy, among other possibilities.  No epileptiform activity is noted.  Thana Farr, MD Neurology [phone] 05/14/2020, 1:56 PM   CT HEAD WO CONTRAST  Result Date: 05/14/2020 CLINICAL DATA:  34 year old male with  encephalopathy found to have dural venous sinus thrombosis and posterior circulation infarcts and/or postictal changes on recent MRI and CT venogram. ETOH abuse, initially presenting with DTs. EXAM: CT HEAD WITHOUT CONTRAST TECHNIQUE: Contiguous axial images were obtained from the base of the skull through the vertex without intravenous contrast. COMPARISON:  CTP and head MRI 05/12/2020. head CT 05/03/2020. FINDINGS: Brain: Mild hypodensity in the posterior right cerebellum corresponding to the diffusion abnormality compatible with small infarct. No hemorrhage or mass effect (series 4, image 54). There is also subtle hypodensity in the posterior right parietal lobe corresponding to some of the other abnormal diffusion along the posterior cerebral hemispheres (series 2, image 23). No acute intracranial hemorrhage identified. No midline shift, mass effect, or evidence of intracranial mass lesion. No ventriculomegaly. Generalized cerebral volume loss again noted. No other abnormal gray-white matter differentiation. Vascular: No suspicious intracranial vascular hyperdensity. Patency of the torcula and left transverse sinus not evaluated in the absence of contrast. Skull: No acute osseous abnormality identified. Sinuses/Orbits: Visualized paranasal sinuses and mastoids are stable and well pneumatized. Other: Visualized orbits and scalp soft  tissues are within normal limits. IMPRESSION: 1. Expected CT appearance of small infarct in the posterior right cerebellum. And subtle evidence of infarct in the posterior right parietal lobe, making postictal explanation of the DWI abnormality there less likely. 2. No associated hemorrhage or mass effect. 3. Patency of the torcula and left transverse sinus not evaluated in the absence of contrast. 4. No new intracranial abnormality. Electronically Signed   By: Odessa Fleming M.D.   On: 05/14/2020 12:45   [place] EKG SITE RITE  Result Date: 05/15/2020 If Site Rite image not attached, placement  could not be confirmed due to current cardiac rhythm.    Medications   Scheduled Meds: . atorvastatin  40 mg Oral Daily  . Chlorhexidine Gluconate Cloth  6 each Topical Daily  . feeding supplement (ENSURE ENLIVE)  237 mL Oral TID BM  . folic acid  1 mg Oral Daily  . furosemide  40 mg Oral Daily  . Gerhardt's butt cream   Topical TID  . LORazepam  1 mg Intravenous Once  . multivitamin with minerals  1 tablet Oral Daily  . pantoprazole  40 mg Oral BID AC  . saccharomyces boulardii  250 mg Oral BID  . sodium chloride flush  3 mL Intravenous Q12H  . spironolactone  25 mg Oral Daily  . thiamine  100 mg Oral Daily   Or  . thiamine  100 mg Intravenous Daily   Continuous Infusions: . sodium chloride Stopped (05/13/20 1628)  . dextrose 5% lactated ringers 50 mL/hr at 05/15/20 1439  . heparin 1,000 Units/hr (05/15/20 0904)  . piperacillin-tazobactam (ZOSYN)  IV 3.375 g (05/15/20 0559)       LOS: 12 days    Time spent: 36 minutes with > 50% spent in coordination of care and direct patient contact.   Pennie Banter, DO [facility]  05/15/2020, 4:05 PM    If 7PM-7AM, please contact night-coverage. How to contact the [facility] Attending or Consulting provider 7A - 7P or covering provider during after hours 7P -7A, for this patient?    1. Check the care team in [facility] and look for a) attending/consulting [facility] provider listed and b) the [facility] team listed 2. Log into [email or web address] and use [place]'s universal password to access. If you do not have the password, please contact the hospital operator. 3. Locate the [facility] provider you are looking for under [facility] and page to a number that you can be directly reached. 4. If you still have difficulty reaching the provider, please page the [facility] (Director on Call) for the Hospitalists listed on amion for assistance.

## 2020-05-15 NOTE — Plan of Care (Signed)
PMT note:  Consult noted. Patient off floor and in [place] at time of attempted visit. Will reattempt tomorrow.

## 2020-05-15 NOTE — Progress Notes (Signed)
PT Cancellation Note  Patient Details Name: Shane Cleckler Sr. MRN: [id number] DOB: [birth year]   Cancelled Treatment:    Reason Eval/Treat Not Completed: Medical issues which prohibited therapy; Pt's BP 141/115 and per nursing pt preparing for procedure off the floor.  Will attempt to see pt at a future date/time as medically appropriate.     Ovidio Hanger PT, DPT 05/15/20, 2:21 PM

## 2020-05-16 ENCOUNTER — Inpatient Hospital Stay (HOSPITAL_COMMUNITY)
Admit: 2020-05-16 | Discharge: 2020-05-16 | Disposition: A | Discharge status: Home / Self Care | Payer: Medicaid Other | Attending: Pulmonary Disease | Admitting: Pulmonary Disease

## 2020-05-16 ENCOUNTER — Inpatient Hospital Stay: Payer: Medicaid Other

## 2020-05-16 ENCOUNTER — Inpatient Hospital Stay: Admit: 2020-05-16 | Payer: Medicaid Other

## 2020-05-16 DIAGNOSIS — Z515 Encounter for palliative care: Secondary | ICD-10-CM

## 2020-05-16 DIAGNOSIS — R578 Other shock: Secondary | ICD-10-CM

## 2020-05-16 DIAGNOSIS — Z7189 Other specified counseling: Secondary | ICD-10-CM

## 2020-05-16 LAB — CULTURE, BLOOD (SINGLE)
Culture: NO GROWTH
Special Requests: ADEQUATE

## 2020-05-16 LAB — URINALYSIS, COMPLETE (UACMP) WITH MICROSCOPIC
Bacteria, UA: NONE SEEN
Bilirubin Urine: NEGATIVE
Glucose, UA: NEGATIVE mg/dL
Ketones, ur: NEGATIVE mg/dL
Leukocytes,Ua: NEGATIVE
Nitrite: NEGATIVE
Protein, ur: NEGATIVE mg/dL
Specific Gravity, Urine: 1.03 (ref 1.005–1.030)
pH: 5 (ref 5.0–8.0)

## 2020-05-16 LAB — CBC
HCT: 22.6 % — ABNORMAL LOW (ref 39.0–52.0)
Hemoglobin: 7.9 g/dL — ABNORMAL LOW (ref 13.0–17.0)
MCH: 33.3 pg (ref 26.0–34.0)
MCHC: 35 g/dL (ref 30.0–36.0)
MCV: 95.4 fL (ref 80.0–100.0)
Platelets: 151 10*3/uL (ref 150–400)
RBC: 2.37 MIL/uL — ABNORMAL LOW (ref 4.22–5.81)
RDW: 12.8 % (ref 11.5–15.5)
WBC: 40.4 10*3/uL — ABNORMAL HIGH (ref 4.0–10.5)
nRBC: 0.1 % (ref 0.0–0.2)

## 2020-05-16 LAB — COMPREHENSIVE METABOLIC PANEL
ALT: 21 U/L (ref 0–44)
AST: 36 U/L (ref 15–41)
Albumin: 2.8 g/dL — ABNORMAL LOW (ref 3.5–5.0)
Alkaline Phosphatase: 71 U/L (ref 38–126)
Anion gap: 13 (ref 5–15)
BUN: 29 mg/dL — ABNORMAL HIGH (ref 6–20)
CO2: 22 mmol/L (ref 22–32)
Calcium: 5.3 mg/dL — CL (ref 8.9–10.3)
Chloride: 102 mmol/L (ref 98–111)
Creatinine, Ser: 1.42 mg/dL — ABNORMAL HIGH (ref 0.61–1.24)
GFR calc Af Amer: >60 mL/min (ref >60)
GFR calc non Af Amer: >60 mL/min (ref >60)
Glucose, Bld: 126 mg/dL — ABNORMAL HIGH (ref 70–99)
Potassium: 3.9 mmol/L (ref 3.5–5.1)
Sodium: 137 mmol/L (ref 135–145)
Total Bilirubin: 0.8 mg/dL (ref 0.3–1.2)
Total Protein: 4.9 g/dL — ABNORMAL LOW (ref 6.5–8.1)

## 2020-05-16 LAB — MRSA PCR SCREENING: MRSA by PCR: NEGATIVE

## 2020-05-16 LAB — GLUCOSE, CAPILLARY
Glucose-Capillary: 143 mg/dL — ABNORMAL HIGH (ref 70–99)
Glucose-Capillary: 187 mg/dL — ABNORMAL HIGH (ref 70–99)

## 2020-05-16 LAB — CORTISOL: Cortisol, Plasma: 14.9 ug/dL

## 2020-05-16 LAB — HEMOGLOBIN AND HEMATOCRIT, BLOOD
HCT: 23.8 % — ABNORMAL LOW (ref 39.0–52.0)
Hemoglobin: 8.1 g/dL — ABNORMAL LOW (ref 13.0–17.0)

## 2020-05-16 LAB — TRIGLYCERIDES: Triglycerides: 45 mg/dL (ref <150)

## 2020-05-16 LAB — MAGNESIUM: Magnesium: 1.9 mg/dL (ref 1.7–2.4)

## 2020-05-16 LAB — HEPARIN LEVEL (UNFRACTIONATED)
Heparin Unfractionated: 0.23 IU/mL — ABNORMAL LOW (ref 0.30–0.70)
Heparin Unfractionated: 0.31 IU/mL (ref 0.30–0.70)

## 2020-05-16 LAB — PROCALCITONIN: Procalcitonin: 1.84 ng/mL

## 2020-05-16 LAB — LACTIC ACID, PLASMA: Lactic Acid, Venous: 1.1 mmol/L (ref 0.5–1.9)

## 2020-05-16 LAB — PHOSPHORUS: Phosphorus: 4.7 mg/dL — ABNORMAL HIGH (ref 2.5–4.6)

## 2020-05-16 LAB — TROPONIN I (HIGH SENSITIVITY): Troponin I (High Sensitivity): 16 ng/L (ref <18)

## 2020-05-16 LAB — LIPASE, BLOOD: Lipase: 912 U/L — ABNORMAL HIGH (ref 11–51)

## 2020-05-16 IMAGING — CT CT ABD-PELV W/O CM
2 of 4 series · 15 of 46 positions shown, 17 images · non-contrast
Comparison: MRCP [DATE]

CLINICAL DATA: Acute, severe, pancreatitis

EXAM:
CT ABDOMEN AND PELVIS WITHOUT CONTRAST
TECHNIQUE: Multidetector CT imaging of the abdomen and pelvis was performed
following the standard protocol without IV contrast.

[Series 2: routine abd/pel wo · axial · 0.80mm/px · z∈[-1158,-678]mm · 12 of 110 slices shown, 14 images]
[im 9/110  soft-tissue]
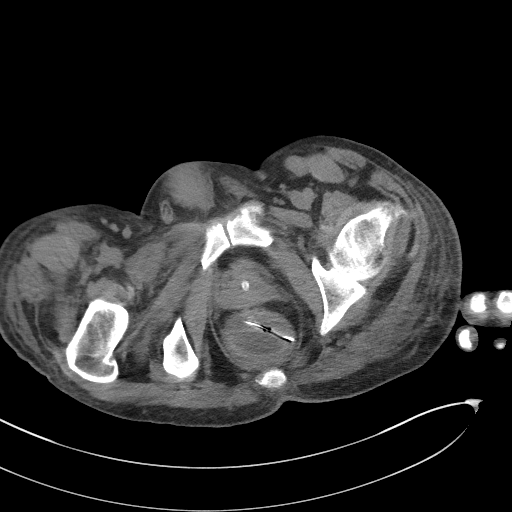
[im 9/110  bone]
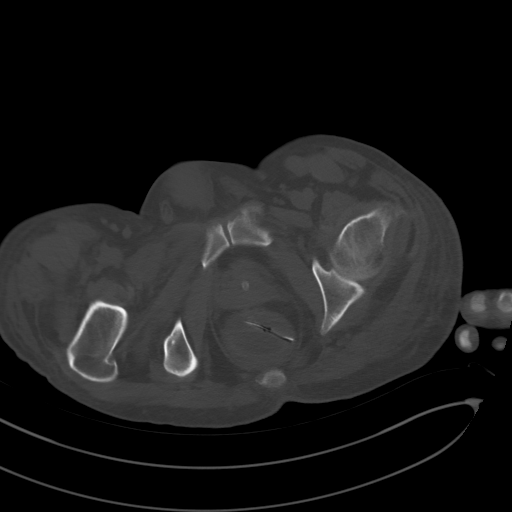
[im 18/110  soft-tissue]
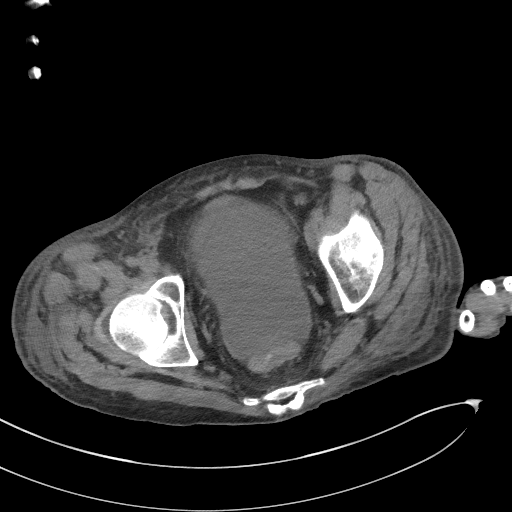
[im 27/110  soft-tissue]
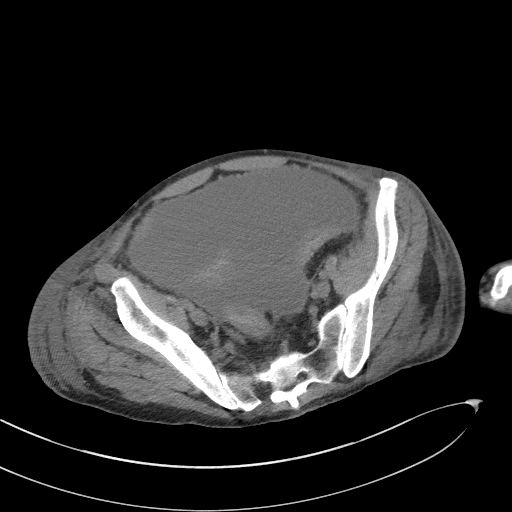
[im 35/110  soft-tissue]
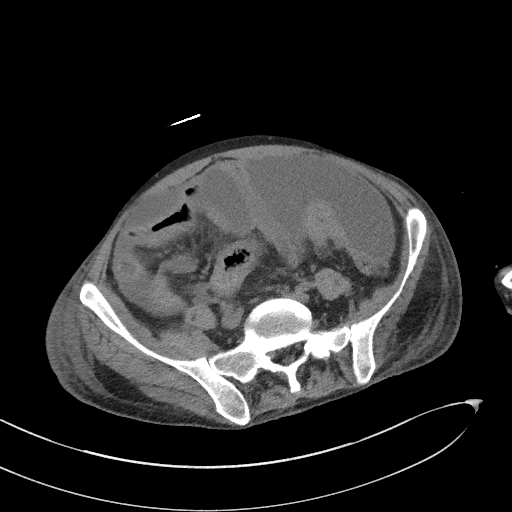
[im 44/110  soft-tissue]
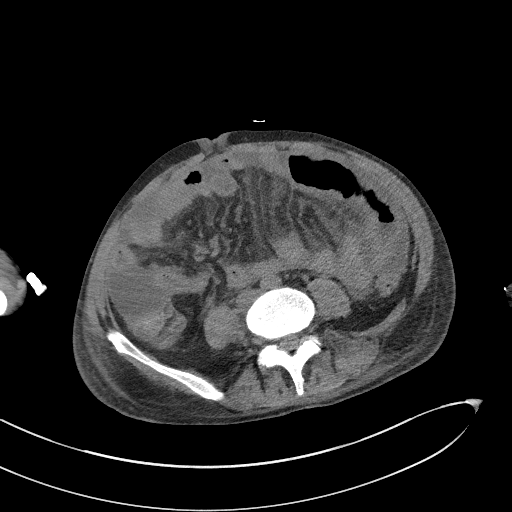
[im 53/110  soft-tissue]
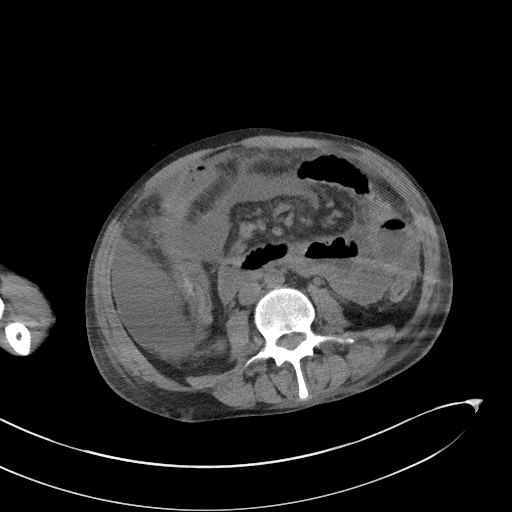
[im 62/110  soft-tissue]
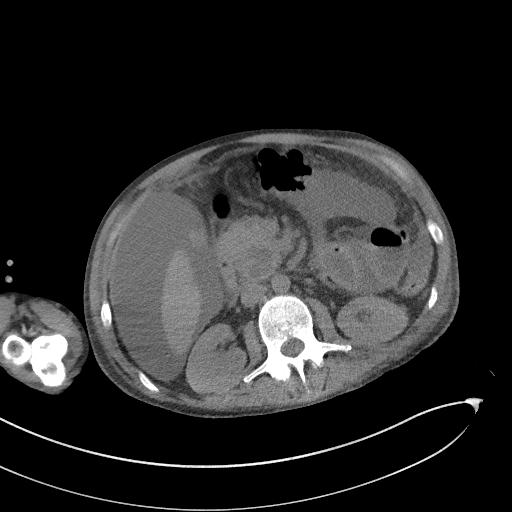
[im 70/110  soft-tissue]
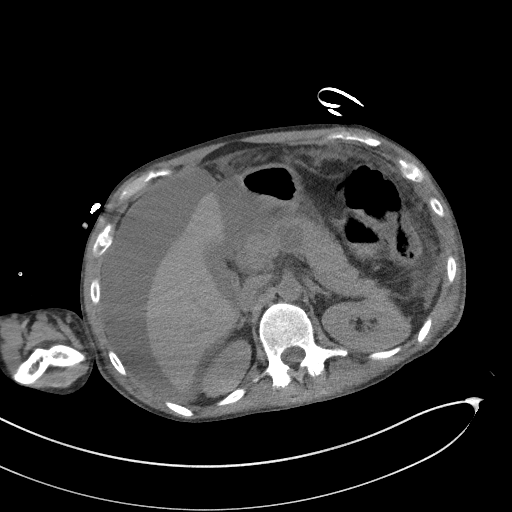
[im 79/110  soft-tissue]
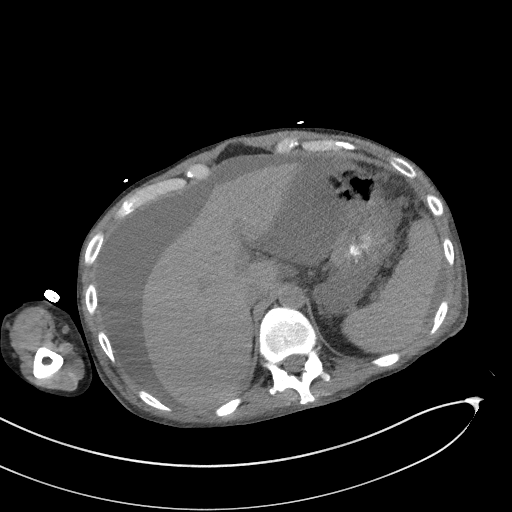
[im 79/110  bone]
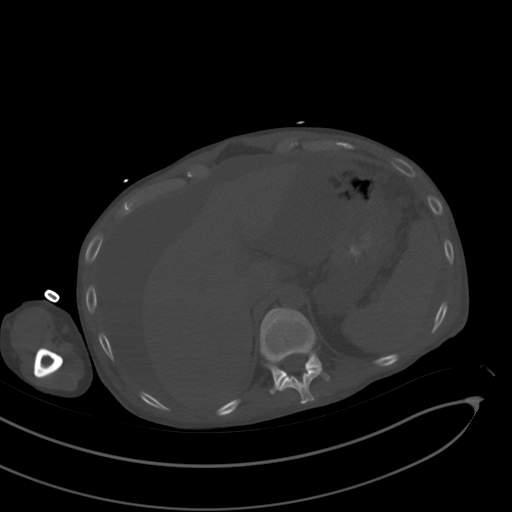
[im 88/110  soft-tissue]
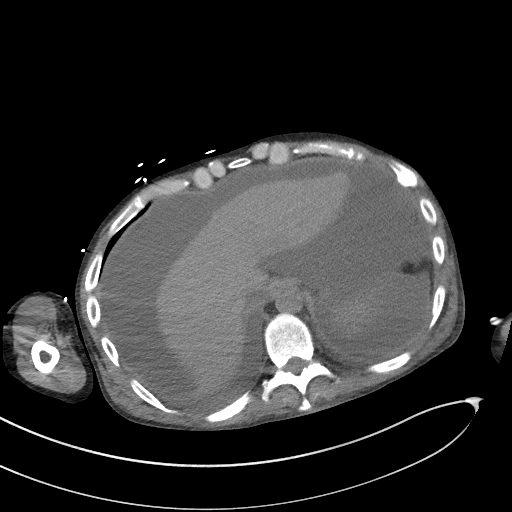
[im 96/110  soft-tissue]
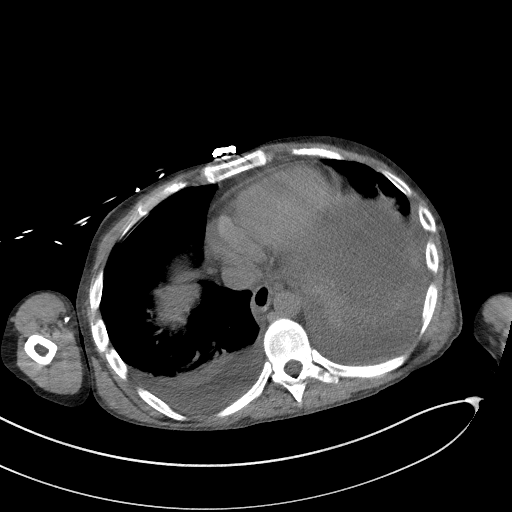
[im 105/110  soft-tissue]
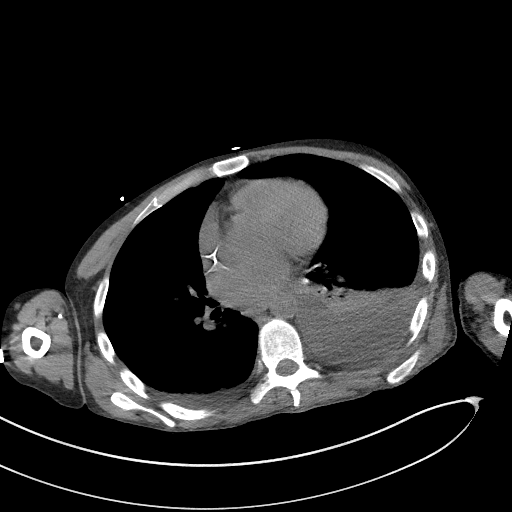

[Series 5: coronal st · coronal · 0.77mm/px · 3 of 79 slices shown]
[im 27/79  soft-tissue]
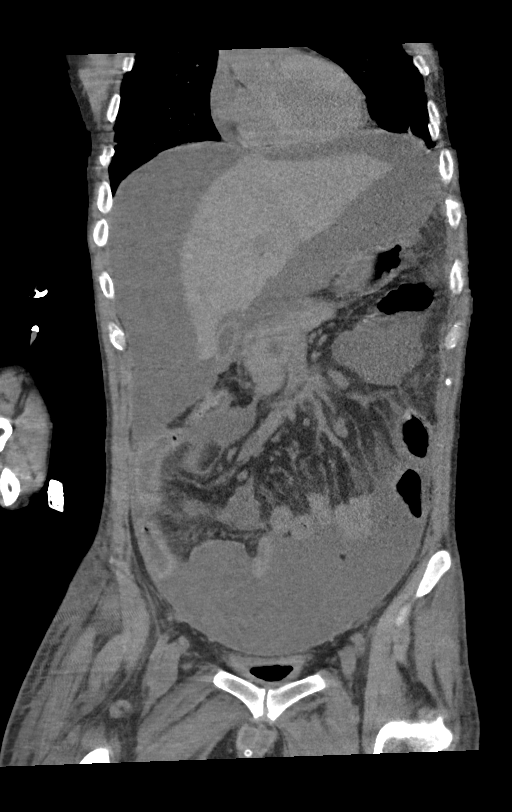
[im 35/79  soft-tissue]
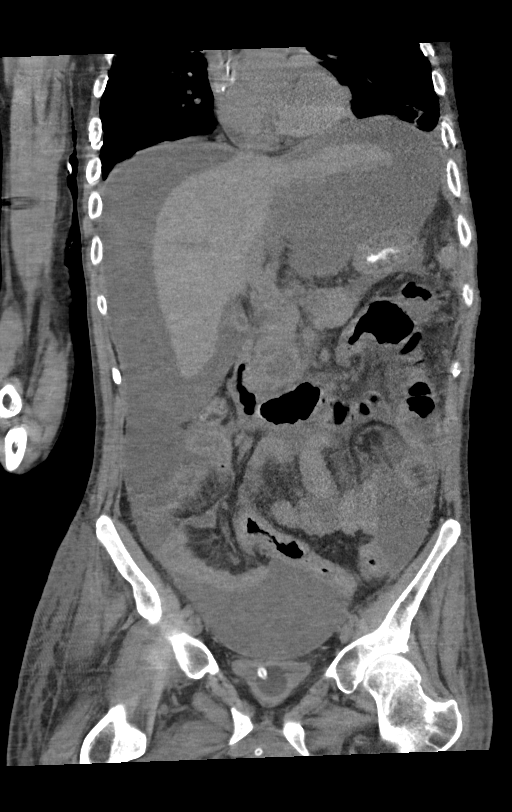
[im 44/79  soft-tissue]
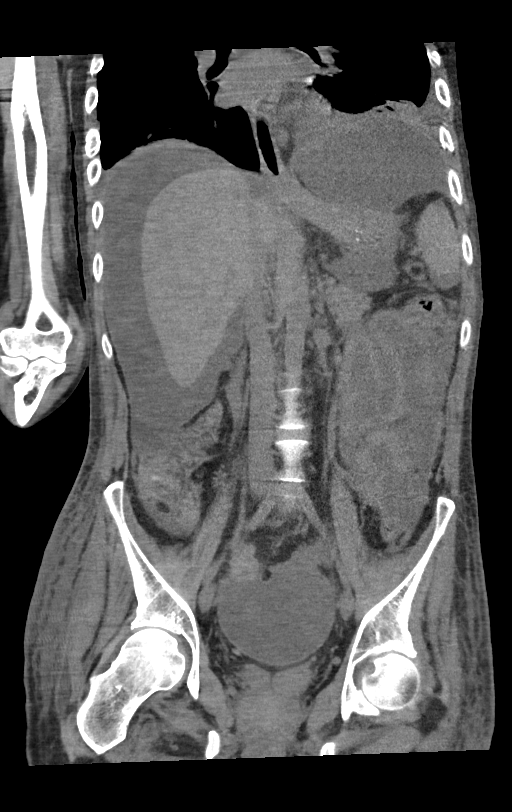

[15 of 46 positions shown; findings below may reference images not displayed]

FINDINGS: Lower chest: Moderate left and small right pleural effusions with
lower lobe atelectasis. Low-density blood pool suggesting anemia.

Hepatobiliary: No focal liver abnormality.No evidence of biliary
obstruction or stone.

Pancreas: Peripancreatic edema may be improved. Multiple pseudocysts
as seen by MRI. The dominant pseudocyst at the uncinate process has
decreased to 2.2 cm. A cyst in the midline pancreas is mildly
increased to 15 mm.

Spleen: Unremarkable.

Adrenals/Urinary Tract: Negative adrenals. No hydronephrosis or
stone. Collapsed bladder by Foley catheter

Stomach/Bowel: Rectal tube in place. No bowel obstruction or
definite inflammation.

Vascular/Lymphatic: No acute vascular abnormality. No mass or
adenopathy.

Reproductive:No pathologic findings.

Other: Ascites with areas of loculation around the liver, right
gutter, and pelvis. The volume is not changed from comparison MRCP.
The thickest pocket met measures 4 cm over the liver and 7 cm
inferior to the liver. Reticulation of the omentum which is likely
edema by prior MRI.

Anasarca.

Musculoskeletal: Healing lateral left sixth and seventh rib
fractures.
IMPRESSION: 1. Multiple pseudocysts. Compared to [DATE], the largest
pseudocyst in the uncinate process has decreased in size to 2.2 cm.
A midline pseudocyst has mildly increased from prior. No acute
inflammation is currently seen about the pancreas.
2. Large volume ascites with a degree of loculation along the liver,
right gutter, and pelvis.
3. Left more than right pleural effusion with atelectasis, not
progressed.

## 2020-05-16 MED ORDER — NOREPINEPHRINE 16 MG/250ML-% IV SOLN
0.0000 ug/min | INTRAVENOUS | Status: DC
  Administered 2020-05-16 – 2020-05-17 (×2): 10 ug/min via INTRAVENOUS
  Filled 2020-05-16 (×2): qty 250

## 2020-05-16 MED ORDER — PANTOPRAZOLE SODIUM 40 MG IV SOLR
40.0000 mg | Freq: Two times a day (BID) | INTRAVENOUS | Status: DC
  Administered 2020-05-16 – 2020-05-27 (×23): 40 mg via INTRAVENOUS
  Filled 2020-05-16 (×24): qty 40

## 2020-05-16 MED ORDER — NOREPINEPHRINE 4 MG/250ML-% IV SOLN
INTRAVENOUS | Status: AC
  Filled 2020-05-16: qty 250

## 2020-05-16 MED ORDER — NOREPINEPHRINE 4 MG/250ML-% IV SOLN
0.0000 ug/min | INTRAVENOUS | Status: DC
  Administered 2020-05-16: 10 ug/min via INTRAVENOUS

## 2020-05-16 MED ORDER — CHLORHEXIDINE GLUCONATE CLOTH 2 % EX PADS
6.0000 | MEDICATED_PAD | Freq: Every day | CUTANEOUS | Status: DC
  Administered 2020-05-17 – 2020-05-30 (×11): 6 via TOPICAL

## 2020-05-16 MED ORDER — TRAVASOL 10 % IV SOLN
INTRAVENOUS | Status: AC
  Filled 2020-05-16: qty 316.8

## 2020-05-16 MED ORDER — INSULIN ASPART 100 UNIT/ML ~~LOC~~ SOLN
0.0000 [IU] | Freq: Four times a day (QID) | SUBCUTANEOUS | Status: DC
  Administered 2020-05-17: 2 [IU] via SUBCUTANEOUS
  Administered 2020-05-17: 1 [IU] via SUBCUTANEOUS
  Administered 2020-05-17: 2 [IU] via SUBCUTANEOUS
  Administered 2020-05-18 (×2): 1 [IU] via SUBCUTANEOUS
  Administered 2020-05-18: 2 [IU] via SUBCUTANEOUS
  Administered 2020-05-18: 1 [IU] via SUBCUTANEOUS
  Administered 2020-05-19 (×3): 2 [IU] via SUBCUTANEOUS
  Administered 2020-05-19 – 2020-05-20 (×2): 1 [IU] via SUBCUTANEOUS
  Administered 2020-05-20: 2 [IU] via SUBCUTANEOUS
  Administered 2020-05-21 – 2020-05-23 (×4): 1 [IU] via SUBCUTANEOUS
  Administered 2020-05-24: 3 [IU] via SUBCUTANEOUS
  Administered 2020-05-24: 2 [IU] via SUBCUTANEOUS
  Administered 2020-05-25: 1 [IU] via SUBCUTANEOUS
  Filled 2020-05-16 (×20): qty 1

## 2020-05-16 MED ORDER — CALCIUM GLUCONATE-NACL 2-0.675 GM/100ML-% IV SOLN
2.0000 g | Freq: Once | INTRAVENOUS | Status: AC
  Administered 2020-05-16: 2000 mg via INTRAVENOUS
  Filled 2020-05-16: qty 100

## 2020-05-16 MED ORDER — CALCIUM GLUCONATE-NACL 1-0.675 GM/50ML-% IV SOLN: 1.0000 g | Freq: Once | INTRAVENOUS | Status: DC

## 2020-05-16 NOTE — Consult Note (Signed)
ANTICOAGULATION CONSULT NOTE  Pharmacy Consult for Heparin infusion  Indication: cerebral venous thrombosis  Patient Measurements: Height: 5\' 7"  (170.2 cm) Weight: 68.3 kg (150 lb 9.2 oz) IBW/kg (Calculated) : 66.1  Vital Signs: Temp: 99.3 F (37.4 C) (07/07 0700) Temp Source: Rectal (07/07 0030) BP: 108/65 (07/07 0700) Pulse Rate: 94 (07/07 0700)  Labs: Recent Labs    05/13/20 0910 05/13/20 1736 05/14/20 0040 05/14/20 1004 05/15/20 0205 05/15/20 0205 05/15/20 0757 05/15/20 1447 05/15/20 2006 05/15/20 2200 05/16/20 0331  HGB  --    < > 10.1*  --  10.3*   < >  --   --  9.1*  --  7.9*  HCT  --    < > 28.6*  --  28.8*  --   --   --  26.4*  --  22.6*  PLT  --    < > 185  --  191  --   --   --  126*  --  151  APTT 30  --   --   --   --   --   --   --   --   --   --   LABPROT 13.8  --  13.8  --   --   --   --   --   --   --   --   INR 1.1  --  1.1  --   --   --   --   --   --   --   --   HEPARINUNFRC  --    < > 0.20*   < > 0.32   < > 0.54 0.54  --  0.20*  --   CREATININE  --    < > 0.66  --  0.78  --   --   --  1.31*  --  1.42*  TROPONINIHS  --   --   --   --   --   --   --   --   --   --  16   < > = values in this interval not displayed.    Estimated Creatinine Clearance: 68.5 mL/min (A) (by C-G formula based on SCr of 1.42 mg/dL (H)).   Medical History: Past Medical History:  Diagnosis Date  . Alcohol use disorder, severe, dependence (HCC)    Assessment: Pharmacy consulted for heparin infusion dosing and monitoring for 34 yo male for cerebral venous thrombosis.   CT Head: "Confirmed thrombus within the confluence of sinuses and left transverse dural venous sinus. 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 No evidence of hemorrhagic conversion." H&H continue to trend down, platelets have normalized  07/05 @ 0040 HL 0.20 subtherapeutic - rate increased to 1150 units/hr  07/05 @ 1004 HL 0.70 supratherapeutic - rate decreased to 1000  units/hr 07/05 @ 1804 HL < 0.10: inc to 1150 units/hr 07/06 @ 0205 HL 0.32: no change 07/06 @ 0757 HL 0.54: decreased to 900 units/hr 07/06 @ 2200 HL 0.20: increased to 1100 units/hr 07/07 @ 0802 HL 0.23: increased to 1150 units/hr  Goal of Therapy:  Heparin level 0.3-0.5 units/ml Monitor platelets by anticoagulation protocol: Yes   Plan:   Heparin level subtherapeutic: increase rate very slightly (based on previous response) to 1150 units/hr   recheck heparin level in 6 hours  F/U CBC in am  09/07, PharmD Clinical Pharmacist 05/16/2020 7:12 AM

## 2020-05-16 NOTE — Progress Notes (Signed)
Chaplain offered wife supportive presence during transition to ICU. She is tearful, grieving, and has memories of previous deaths in ICU. Her mother came to offer support. Shane Jenkins and his wife have a 34yo son who is in the home whom Shane Jenkins has taken care of. Shane Jenkins's only living sibling is in [place] and messages have been left to inform him of Shane Jenkins's decline. Shane Jenkins has struggled since both of his parents died especially since his father's recent death. Wife struggles knowing Shane Jenkins has struggled with believing in God. Chaplain offered words of assurance and comfort. Chaplain continues to be available for additional support upon request.     05/16/20 0100  Clinical Encounter Type  Visited With Family  Visit Type Follow-up  Spiritual Encounters  Spiritual Needs Emotional;Grief support

## 2020-05-16 NOTE — Consult Note (Signed)
PHARMACY - TOTAL PARENTERAL NUTRITION CONSULT NOTE   Indication: severe malnutrition  Patient Measurements: Height: 5\' 7"  (170.2 cm) Weight: 68.3 kg (150 lb 9.2 oz) IBW/kg (Calculated) : 66.1 TPN AdjBW (KG): 68.3 Body mass index is 23.58 kg/m.  Assessment: 34 year old male with PMHx of EtOH abuse admitted with alcohol withdrawal, acute alcoholic pancreatitis, acute metabolic encephalopathy, ascites s/p paracentesis on 6/28 with 2.8 L fluid removed, chest pain, acute renal failure.   Glucose / Insulin:  Electrolytes: hypocalcemia Renal: SCr 1.75-->0.54-->1.42 LFTs / TGs: pending Prealbumin / albumin: albumin 2.8 g/dL Intake / Output; MIVF:  GI Imaging: Surgeries / Procedures:   Central access: 05/16/20 (pending PICC placement) TPN start date: 05/16/20  Nutritional Goals (per RD recommendation on 05/16/20): kCal: 1900 - 2200 / day, Protein: 95 - 105 grams/day, Fluid: 1900 - 2200 mL/day Goal TPN rate is 80 mL/hr (provides 105 g of protein and 2200 kcals per day)  Current Nutrition:  Regular Diet (currently too lethargic to consume)  Plan:   Start TPN at 71mL/hr at 1800  Electrolytes in standard, Cl:Ac ratio 1:1, AA 44 g/L, dextrose 13%, lipids 25 g/L, 820 mL total volume  Add standard MVI, 100 mg thiamine, 1 mg folic acid and trace elements to TPN  Initiate Sensitive q6h SSI and adjust as needed   2 grams IV calcium gluconate  Monitor TPN labs daily until stable due to high risk for refeeding syndrome  31m 05/16/2020,7:48 AM

## 2020-05-16 NOTE — Progress Notes (Signed)
Subjective: Patient transferred to the ICU overnight due to lethargy and hypotension.  Currently on pressors.    Objective: Current vital signs: BP 105/61 (BP Location: Left Arm)   Pulse 97   Temp 100.2 F (37.9 C) (Rectal) Comment (Src): bair hugger turned off  Resp (!) 35   Ht _0  (1.702 m)   Wt 68.3 kg   SpO2 94%   BMI 23.58 kg/m  Vital signs in last 24 hours: Temp:  [90.9 F (32.7 C)-100.2 F (37.9 C)] 100.2 F (37.9 C) (07/07 0800) Pulse Rate:  [63-127] 97 (07/07 0800) Resp:  [15-40] 35 (07/07 0800) BP: (75-149)/(47-115) 105/61 (07/07 0800) SpO2:  [94 %-100 %] 94 % (07/07 0800) Weight:  [68.3 kg] 68.3 kg (07/07 0030)  Intake/Output from previous day: 07/06 0701 - 07/07 0700 In: 1402.3 [P.O.:120; I.V.:669; IV Piggyback:493.3] Out: 120 [Stool:120] Intake/Output this shift: Total I/O In: 72 [I.V.:58.9; IV Piggyback:13.2] Out: -  Nutritional status:  Diet Order            Diet regular Room service appropriate? Yes; Fluid consistency: Thin  Diet effective now                 Neurologic Exam: Mental Status: Lethargic but arousable.  Speech dysarthric.  Does not follow commands.   Cranial Nerves: ZJ:QBHALP to bilateral confrontation III,IV, FX:TKWIO-XBDZHG motions intact bilaterally V,VII: intact corneals bilaterally VIII: hearing normal bilaterally IX,X: gag reflex present XI: unable to test XII: unable to test Motor/Sensory: Complains of pain in all extremities.  Trace movement noted throughout  Lab Results: Basic Metabolic Panel: Recent Labs  Lab 05/11/20 0603 05/11/20 0603 05/12/20 0557 05/12/20 0557 05/13/20 0618 05/13/20 0618 05/14/20 0040 05/14/20 0040 05/15/20 0205 05/15/20 2006 05/16/20 0331  NA 134*   < > 134*   < > 135  --  135  --  136 137 137  K 3.4*   < > 3.1*   < > 3.8  --  3.4*  --  3.8 3.6 3.9  CL 95*   < > 97*   < > 101  --  99  --  103 103 102  CO2 30   < > 27   < > 26  --  28  --  _1 GLUCOSE 117*   < > 111*   <  > 113*  --  141*  --  132* 147* 126*  BUN 9   < > 14   < > 16  --  17  --  20 26* 29*  CREATININE 0.60*   < > 0.65   < > 0.56*  --  0.66  --  0.78 1.31* 1.42*  CALCIUM 7.8*   < > 7.8*   < > 7.9*   < > 7.3*   < > 6.1* 5.2* 5.3*  MG 1.7   < > 1.6*  --  1.7  --  2.0  --  1.8  --  1.9  PHOS 3.8  --  4.3  --  4.3  --   --   --  3.7  --  4.7*   < > = values in this interval not displayed.    Liver Function Tests: Recent Labs  Lab 05/13/20 0618 05/14/20 0040 05/15/20 0205 05/15/20 2006 05/16/20 0331  AST 34 34 39 39 36  ALT _2 ALKPHOS 95 87 81 79 71  BILITOT 0.8 0.7 0.7 0.7 0.8  PROT 4.7* 4.4* 4.4* 4.1* 4.9*  ALBUMIN 1.7* 1.6* 1.5* 1.4* 2.8*   Recent Labs  Lab 05/10/20 0520 05/13/20 0618 05/14/20 0040 05/15/20 0205 05/16/20 0331  LIPASE 825* 1,543* 1,510* 1,141* 912*   Recent Labs  Lab 05/09/20 1544 05/12/20 1117 05/15/20 2006  AMMONIA 24 <9* 17    CBC: Recent Labs  Lab 05/10/20 0520 05/10/20 0520 05/11/20 0603 05/11/20 0603 05/12/20 0557 05/12/20 0557 05/13/20 0618 05/14/20 0040 05/15/20 0205 05/15/20 2006 05/16/20 0331  WBC 18.6*   < > 19.5*   < > 19.4*   < > 20.4* 20.3* 26.3* 34.2* 40.4*  NEUTROABS 15.3*  --  15.9*  --  16.4*  --  17.5*  --   --  31.7*  --   HGB 10.7*   < > 11.1*   < > 10.4*   < > 10.5* 10.1* 10.3* 9.1* 7.9*  HCT 30.1*   < > 31.2*   < > 29.5*   < > 30.3* 28.6* 28.8* 26.4* 22.6*  MCV 93.5   < > 92.0   < > 92.5   < > 94.4 93.2 90.6 95.0 95.4  PLT 205   < > 219   < > 239   < > 246 185 191 126* 151   < > = values in this interval not displayed.    Cardiac Enzymes: No results for input(s): CKTOTAL, CKMB, CKMBINDEX, TROPONINI in the last 168 hours.  Lipid Panel: No results for input(s): CHOL, TRIG, HDL, CHOLHDL, VLDL, LDLCALC in the last 168 hours.  CBG: Recent Labs  Lab 05/14/20 0849 05/14/20 1655 05/14/20 2112 05/15/20 0823 05/15/20 1827  GLUCAP 177* 122* 156* 133* 139*    Microbiology: Results for orders placed  or performed during the hospital encounter of 05/03/20  SARS Coronavirus 2 by RT PCR (hospital order, performed in [facility] hospital lab) Nasopharyngeal Nasopharyngeal Swab     Status: None   Collection Time: 05/03/20  8:25 PM   Specimen: Nasopharyngeal Swab  Result Value Ref Range Status   SARS Coronavirus 2 NEGATIVE NEGATIVE Final    Comment: (NOTE) SARS-CoV-2 target nucleic acids are NOT DETECTED.  The SARS-CoV-2 RNA is generally detectable in upper and lower respiratory specimens during the acute phase of infection. The lowest concentration of SARS-CoV-2 viral copies this assay can detect is 250 copies / mL. A negative result does not preclude SARS-CoV-2 infection and should not be used as the sole basis for treatment or other patient management decisions.  A negative result may occur with improper specimen collection / handling, submission of specimen other than nasopharyngeal swab, presence of viral mutation(s) within the areas targeted by this assay, and inadequate number of viral copies (<250 copies / mL). A negative result must be combined with clinical observations, patient history, and epidemiological information.  Fact Sheet for Patients:   StrictlyIdeas.no  Fact Sheet for Healthcare Providers: BankingDealers.co.za  This test is not yet approved or  cleared by the [place] FDA and has been authorized for detection and/or diagnosis of SARS-CoV-2 by FDA under an Emergency Use Authorization (EUA).  This EUA will remain in effect (meaning this test can be used) for the duration of the COVID-19 declaration under Section 564(b)(1) of the Act, 21 U.S.C. section 360bbb-3(b)(1), unless the authorization is terminated or revoked sooner.  Performed at [facility], [address], [place], [place] [address]   MRSA PCR Screening     Status: None   Collection Time: 05/03/20 11:57 PM   Specimen: Nasopharyngeal   Result Value Ref  Range Status   MRSA by PCR NEGATIVE NEGATIVE Final    Comment:        The GeneXpert MRSA Assay (FDA approved for NASAL specimens only), is one component of a comprehensive MRSA colonization surveillance program. It is not intended to diagnose MRSA infection nor to guide or monitor treatment for MRSA infections. Performed at [facility], [address] [place], [place] [address]   Body fluid culture     Status: None   Collection Time: 05/07/20 12:00 PM   Specimen: PATH Cytology Peritoneal fluid  Result Value Ref Range Status   Specimen Description   Final    PERITONEAL Performed at [facility], [address] [place], [place] [address]    Special Requests   Final    PERITONEAL Performed at [facility], [address] [place], [place] [address]    Gram Stain   Final    WBC PRESENT,BOTH PMN AND MONONUCLEAR NO ORGANISMS SEEN CYTOSPIN SMEAR    Culture   Final    NO GROWTH 3 DAYS Performed at [facility], [place] [address], [place], [place] [address]    Report Status 05/11/2020 FINAL  Final  Culture, blood (single) w Reflex to ID Panel     Status: None   Collection Time: 05/11/20  7:59 AM   Specimen: BLOOD  Result Value Ref Range Status   Specimen Description BLOOD LEFT WRIST  Final   Special Requests   Final    BOTTLES DRAWN AEROBIC AND ANAEROBIC Blood Culture adequate volume   Culture   Final    NO GROWTH 5 DAYS Performed at [facility], [address] [place], [place] [address]    Report Status 05/16/2020 FINAL  Final  CULTURE, BLOOD (ROUTINE X 2) w Reflex to ID Panel     Status: None (Preliminary result)   Collection Time: 05/15/20 11:52 PM   Specimen: Left Antecubital; Blood  Result Value Ref Range Status   Specimen Description LEFT ANTECUBITAL  Final   Special Requests   Final    BOTTLES DRAWN AEROBIC AND ANAEROBIC Blood Culture adequate volume   Culture   Final    NO GROWTH < 12  HOURS Performed at [facility], [address] [place], [place] [address]    Report Status PENDING  Incomplete  CULTURE, BLOOD (ROUTINE X 2) w Reflex to ID Panel     Status: None (Preliminary result)   Collection Time: 05/15/20 11:53 PM   Specimen: BLOOD LEFT HAND  Result Value Ref Range Status   Specimen Description BLOOD LEFT HAND  Final   Special Requests   Final    BOTTLES DRAWN AEROBIC AND ANAEROBIC Blood Culture adequate volume   Culture   Final    NO GROWTH < 12 HOURS Performed at [facility], [address], [place], [place] [address]    Report Status PENDING  Incomplete  MRSA PCR Screening     Status: None   Collection Time: 05/16/20 12:24 AM   Specimen: Nasopharyngeal  Result Value Ref Range Status   MRSA by PCR NEGATIVE NEGATIVE Final    Comment:        The GeneXpert MRSA Assay (FDA approved for NASAL specimens only), is one component of a comprehensive MRSA colonization surveillance program. It is not intended to diagnose MRSA infection nor to guide or monitor treatment for MRSA infections. Performed at [facility], [address] [place], [place] [address]     Coagulation  Studies: Recent Labs    05/14/20 0040  LABPROT 13.8  INR 1.1    Imaging: EEG  Result Date: 05/14/2020 Alexis Goodell, MD     05/14/2020  2:01 PM ELECTROENCEPHALOGRAM REPORT Patient: Shane Bailiff Sr.       Room #: 109A-AA EEG No. ID: [id number] Age: 34 y.o.        Sex: male Requesting Physician: Arbutus Ped Report Date:  05/14/2020       Interpreting Physician: Alexis Goodell History: Sadrac Zeoli Sr. is an 34 y.o. male with altered mental status Medications: Heparin, Lipitor, COreg, Zosyn, Aldactone, Thiamine Conditions of Recording:  This is a 21 channel routine scalp EEG performed with bipolar and monopolar montages arranged in accordance to the international 10/20 system of electrode placement. One channel was dedicated to EKG recording. The patient is in the  awake state. Description:  Artifact is prominent during the recording often obscuring the background rhythm due to a low voltage beta activity that is diffusely distributed.  When able to be visualized the background is slow and poorly organized and consists of activity in the delta theta continuum that is seen diffusely.  This activity is continuous throughout the recording. No epileptiform activity is noted. Hyperventilation was not performed.  Intermittent photic stimulation was performed but failed to illicit any change in the tracing. IMPRESSION: This is an abnormal EEG secondary to general background slowing.  This finding may be seen with a diffuse disturbance that is etiologically nonspecific, but may include a metabolic encephalopathy, among other possibilities.  No epileptiform activity is noted.  Alexis Goodell, MD Neurology [phone] 05/14/2020, 1:56 PM   CT ABDOMEN PELVIS WO CONTRAST  Result Date: 05/16/2020 CLINICAL DATA:  Acute, severe, pancreatitis EXAM: CT ABDOMEN AND PELVIS WITHOUT CONTRAST TECHNIQUE: Multidetector CT imaging of the abdomen and pelvis was performed following the standard protocol without IV contrast. COMPARISON:  MRCP 05/12/2020 FINDINGS: Lower chest: Moderate left and small right pleural effusions with lower lobe atelectasis. Low-density blood pool suggesting anemia. Hepatobiliary: No focal liver abnormality.No evidence of biliary obstruction or stone. Pancreas: Peripancreatic edema may be improved. 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 The dominant pseudocyst at the uncinate process has decreased to 2.2 cm. A cyst in the midline pancreas is mildly increased to 15 mm. Spleen: Unremarkable. Adrenals/Urinary Tract: Negative adrenals. No hydronephrosis or stone. Collapsed bladder by Foley catheter Stomach/Bowel: Rectal tube in place. No bowel obstruction or definite inflammation. Vascular/Lymphatic: No acute vascular abnormality. No mass or adenopathy. Reproductive:No pathologic  findings. Other: Ascites with areas of loculation around the liver, right gutter, and pelvis. The volume is not changed from comparison MRCP. The thickest pocket met measures 4 cm over the liver and 7 cm inferior to the liver. Reticulation of the omentum which is likely edema by prior MRI. Anasarca. Musculoskeletal: Healing lateral left sixth and seventh rib fractures. IMPRESSION: 1. Multiple pseudocysts. Compared to 05/12/2020, the largest pseudocyst in the uncinate process has decreased in size to 2.2 cm. A midline pseudocyst has mildly increased from prior. No acute inflammation is currently seen about the pancreas. 2. Large volume ascites with a degree of loculation along the liver, right gutter, and pelvis. 3. Left more than right pleural effusion with atelectasis, not progressed. Electronically Signed   By: Monte Fantasia M.D.   On: 05/16/2020 04:35   CT HEAD WO CONTRAST  Result Date: 05/14/2020 CLINICAL DATA:  34 year old male with encephalopathy found to have dural venous sinus thrombosis and posterior circulation infarcts and/or postictal changes on recent MRI  and CT venogram. ETOH abuse, initially presenting with DTs. EXAM: CT HEAD WITHOUT CONTRAST TECHNIQUE: Contiguous axial images were obtained from the base of the skull through the vertex without intravenous contrast. COMPARISON:  CTP and head MRI 05/12/2020. head CT 05/03/2020. FINDINGS: Brain: Mild hypodensity in the posterior right cerebellum corresponding to the diffusion abnormality compatible with small infarct. No hemorrhage or mass effect (series 4, image 54). There is also subtle hypodensity in the posterior right parietal lobe corresponding to some of the other abnormal diffusion along the posterior cerebral hemispheres (series 2, image 23). No acute intracranial hemorrhage identified. No midline shift, mass effect, or evidence of intracranial mass lesion. No ventriculomegaly. Generalized cerebral volume loss again noted. No other abnormal  gray-white matter differentiation. Vascular: No suspicious intracranial vascular hyperdensity. Patency of the torcula and left transverse sinus not evaluated in the absence of contrast. Skull: No acute osseous abnormality identified. Sinuses/Orbits: Visualized paranasal sinuses and mastoids are stable and well pneumatized. Other: Visualized orbits and scalp soft tissues are within normal limits. IMPRESSION: 1. Expected CT appearance of small infarct in the posterior right cerebellum. And subtle evidence of infarct in the posterior right parietal lobe, making postictal explanation of the DWI abnormality there less likely. 2. No associated hemorrhage or mass effect. 3. Patency of the torcula and left transverse sinus not evaluated in the absence of contrast. 4. No new intracranial abnormality. Electronically Signed   By: Genevie Ann M.D.   On: 05/14/2020 12:45   MR BRAIN WO CONTRAST  Result Date: 05/15/2020 CLINICAL DATA:  Dural venous sinus thrombosis EXAM: MRI HEAD WITHOUT CONTRAST TECHNIQUE: Multiplanar, multiecho pulse sequences of the brain and surrounding structures were obtained without intravenous contrast. COMPARISON:  Brain MRI 05/12/2020 FINDINGS: Brain: There are areas of residual abnormal diffusion restriction over the right posterior parietal lobe and right cerebellum. No new diffusion abnormality. Normal white matter signal. Normal volume of CSF spaces. No chronic microhemorrhage. Vascular: Normal arterial flow voids. Skull and upper cervical spine: Normal marrow signal Sinuses/Orbits: Negative. Other: None. IMPRESSION: Residual abnormal diffusion restriction over the right posterior parietal lobe and right cerebellum, consistent with expected evolution of subacute venous infarcts. No new diffusion abnormality. Electronically Signed   By: Ulyses Jarred M.D.   On: 05/15/2020 22:55   DG Chest Port 1 View  Result Date: 05/15/2020 CLINICAL DATA:  Status post PICC line placement EXAM: PORTABLE CHEST 1 VIEW  COMPARISON:  05/03/2020 FINDINGS: Cardiac shadow is stable. Increasing right-sided pleural effusion and likely underlying atelectasis is present. New right-sided PICC line is noted with the tip in the mid right atrium. This could be withdrawn as much as 3 cm as necessary. Right lung is clear. Small right effusion is noted. No bony abnormality is seen. IMPRESSION: Bilateral pleural effusions left greater than right. Associated left basilar atelectasis/infiltrate is present. PICC line as described in the mid right atrium. This could be withdrawn as many as 3 cm as clinically indicated. Electronically Signed   By: Inez Catalina M.D.   On: 05/15/2020 19:27   [place] EKG SITE RITE  Result Date: 05/15/2020 If Site Rite image not attached, placement could not be confirmed due to current cardiac rhythm.   Medications:  I have reviewed the patient's current medications. Scheduled: . Chlorhexidine Gluconate Cloth  6 each Topical Daily  . Gerhardt's butt cream   Topical TID  . pantoprazole (PROTONIX) IV  40 mg Intravenous Q12H  . sodium chloride flush  3 mL Intravenous Q12H    Assessment/Plan: 34  y.o.malewith a long history of ETOH abuse. Abruptly stopped drinking the day before presentation (6/24) and became progressively confused. Was brought to the ED by his wife and while in the ED was witnessed to have a seizure. Patient has not had further seizures but remained confused and lethargic. MRI of the brain personally reviewed and reveals a possible cerebral venous thrombosis as well as abnormal diffusion in the right cerebellum, bilateral parietal lobes and left occipital pole. 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 Thyroid normal. EEG only significant for slowing.  B1 pending. Mental status not improving.   MRI of the brain repeated last evening and personally reviewed.  No new infarcts noted.  No evidence of hydrocephalus or hemorrhage.    Recommendations: 1. Continue heparin 2.  Thiamine level pending  Case discussed with wife, Dr. Arbutus Ped and Dr. Lanney Gins   LOS: 38 days   Alexis Goodell, MD Neurology [phone] 05/16/2020  10:22 AM

## 2020-05-16 NOTE — Plan of Care (Signed)
At hand off at beginning of shift, we were concerned about how patient was looking.  Eyes open but not responding and not following instructions.  Pt very pale and cool to touch.  Temperature wouldn't register rectally or via axillary. Reached out to charge nurse, SWOT nurse and oncall NP. SWOT nurse gave ICU charge a head's up.   NP came up to the floor and assessed patient and ordered labs. When pt came back from MRI, we got VS and MEWS score became red.  Called a rapid response and pt was transferred to IC-13.

## 2020-05-16 NOTE — Progress Notes (Signed)
   05/16/20 0000  Assess: MEWS Score  Temp (!) 91 F (32.8 C)  BP (!) 78/56  Pulse Rate 78  Resp (!) 30  SpO2 99 %  O2 Device Room Air  Assess: MEWS Score  MEWS Temp 2  MEWS Systolic 2  MEWS Pulse 0  MEWS RR 2  MEWS LOC 0  MEWS Score 6  MEWS Score Color Red  Assess: if the MEWS score is Yellow or Red  Were vital signs taken at a resting state? Yes  Focused Assessment Documented focused assessment  Early Detection of Sepsis Score *See Row Information* Medium  MEWS guidelines implemented *See Row Information* No, previously yellow, continue vital signs every 4 hours  Treat  MEWS Interventions Escalated (See documentation below)  Take Vital Signs  Increase Vital Sign Frequency  Red: Q 1hr X 4 then Q 4hr X 4, if remains red, continue Q 4hrs  Escalate  MEWS: Escalate Red: discuss with charge nurse/RN and provider, consider discussing with RRT  Notify: Charge Nurse/RN  Name of Charge Nurse/RN Notified Brandi, RN  Date Charge Nurse/RN Notified 05/16/20  Time Charge Nurse/RN Notified 0000  Notify: Provider  Provider Name/Title Manuela Schwartz, NP  Date Provider Notified 05/16/20  Time Provider Notified 0000  Notification Type Page  Notification Reason Change in status  Response See new orders  Date of Provider Response 05/16/20  Time of Provider Response 0000  Notify: Rapid Response  Name of Rapid Response RN Notified  (Called Rapid Response)  Date Rapid Response Notified 05/16/20  Time Rapid Response Notified 0000  Document  Patient Outcome Transferred/level of care increased  Progress note created (see row info) Yes

## 2020-05-16 NOTE — Progress Notes (Signed)
Pt was transfer from med/Surg floor to ICU, RN reported is low BP and pt is lethargic, pt unable to follow any command, will yell out when turned of his feet's are touched at timer his speech will be clear but will not answer any questions. Pt was stated on levo gtt for BP, foley with amber cloudy urine, rectal tube with brown foul odor liquid stool.  Wife was updated at bedside by NP.

## 2020-05-16 NOTE — Progress Notes (Signed)
PT Cancellation Note  Patient Details Name: Shane Rubenstein Sr. MRN: [id number] DOB: [birth year]   Cancelled Treatment:    Reason Eval/Treat Not Completed: Medical issues which prohibited therapy; Chart reviewed. Pt transferred to ICU secondary to change in status. Ca+ noted to be critically low as well (5.3) and contraindicated for therapy. Will complete current order. Please re-consult therapy once pt is medically appropriate.    Ovidio Hanger PT, DPT 05/16/20, 8:44 AM

## 2020-05-16 NOTE — Progress Notes (Addendum)
PROGRESS NOTE    Shane Jenkins Sr.   KDT:[id number]  DOB: [birth year]  PCP: Lorelee Market, MD    DOA: 05/03/2020 LOS: 64   Brief Narrative   34 year old gentleman with no medical history, chronic alcoholism, has been drinking nonstop about 12 packs of beer daily since age of 37 presented to the ED on  05/03/2020 for evaluation of progressive confusion and lethargy.  He also had episode of unresponsiveness in the triage area.  As per patient's fianc, patient stopped drinking about 24 hours prior to arrival and started behaving altered, confused.  Also reported multiple episodes diarrhea, but no nausea or vomiting.    In the ED, he was ill-appearing, BP stable, tachycardic with heart rate 141, respirations 21, and afebrile.  Labs were notable for sodium 124, potassium was 2.8, creatinine 1.75, bilirubin 2.  Anion gap 19.  CK 700.  Chest x-ray was essentially normal.  CT head was normal.  CT abdomen pelvis was consistent with pancreatitis with peripancreatic edema and multiple lesions that are most likely pseudocysts, moderate ascites with mild fatty  infiltration of liver, small BILATERAL pleural effusions and minimal bibasilar atelectasis (other non-pertinent findings in report).   Admitted to the hospitalist service for further evaluation and management on acute alcohol withdrawal syndrome, acute pancreatitis and possible SBP (later ruled out).          Assessment & Plan   Principal Problem:   Acute alcoholic pancreatitis Active Problems:   Acute metabolic encephalopathy   Severe protein-calorie malnutrition (HCC)   Sinus tachycardia   Alcohol withdrawal seizure with delirium (HCC)   Hyponatremia   Hypokalemia   AKI (acute kidney injury) ([place])   Elevated CK   Heme positive stool   Thrombocytopenia (HCC)  Septic shock with hypothermia and hypotension - now in stepdown/ICU on Levophed, transferred overnight 7/6-7.  PCCM following.  Maintain MAP >65.  Further mgmt as  below.  Acute alcoholic pancreatitis -present on admission with abdominal pain, tachycardia, diarrhea, hypotension.  No gallbladder abnormalities on CT abdomen.  No longer complains of abdominal pain, only distention.   Worsening leukocytosis 20 >> 26 >> 40k concerning for infected necrosis.  Lipase slightly better today.  Continue Zosyn.  Monitor abdominal exam closely.  Continue supportive care.     Severe Protein Calorie Malnutrition - POA, due to chronic alcohol abuse and not eating.  Minimal PO intake during admission.  Dietician consulted.  Will require tube feeding if wife decides to continue full scope of care (palliative discussion in progress). Dietician's recommendations have been made.  Daily CMP, Mg, Phos.  High risk of refeeding syndrome. Due to ascites, only option for enteral feeding is naso-enteric post-pyloric Dobhoff (pt likely would require restraints).    Goals of care - had discussion 7/5 with wife and her mother at bedside regarding unclear prognosis at this time.  They agree to palliative consult.  Do want to pursue artificial feeding.  Otherwise continue full scope of care at this time.   7/7: Palliative care met with wife at bedside today, she is struggling to make decisions, wants patient to make his own decisions about his care, but his is persistently confused / delirious.  She is considering hospice.  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  Neurology following.  Heparin infusion.  Closely monitor for bleeding, CBC's.  Neuro-checks.  Repeat head CT 7/5 ruled out hemorrhage or hydrocephalus.  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  ASA and statin started.  Neurology following.  Repeat CT 7/5 as above.  Generalized weakness -  severe.  Due to chronic alcoholism and malnutrition as well as prolonged acute illness.  He is requiring heavy assistance with bed mobility and standing.   Initial PT recommendation is SNF.  TOC consulted.  If he survives this admission, expect CIR will be needed.  Acute metabolic encephalopathy - waxes and wanes, having hallucinations, agitation at times.  Lethargy has resolved.  No longer receiving benzos for alcohol withdrawal.  CT head upon admission was negative for acute findings that showed generalized atrophy advanced for his age.  Exam is non-focal.  Ammonia level normal x 2.  B12 normal.  UDS on admission was never done, please collect and process.  Neurology consulted, appreciate input.  EEG consistent with encephalopathy, no epileptiform activity.   Mild TSH elevation but normal T4.  Repeat head CT today.  Follow up thiamine level.    Hematuria - new 7/3, in addition to pt reporting dysuria.  UA negative.  Appears resolved at this time.  Diarrhea - present on admission, persistent.  No fevers and leukocytosis likely due to pancreatitis, very unlikely infectious. Suspect malabsorption due to pancreatitis.  FMS in place.  Leukocytosis - worsening.  Likely due to pancreatitis, no other evidence of infection.  UA negative.  No respiratory symptoms.  Monitor closely.  On empiric Zosyn (started 7/4) given persistent high wbc, concern for developing pancreatic infection or SBP.  Follow blood cultures, no growth so far.  Sinus tachycardia - resolved after starting empiric antibiotics.  Had been persistently elevated HR despite being past alcohol withdrawal and pain better from pancreatitis.  Ascites fluid cultures negative to date.  Started low dose of Coreg for BP and HR control.  Continue to manage underlying issues as above.  Blood cultures negative to date.  No respiratory or urinary symptoms.  7/6 - worsening leukocytosis, concern for infected necrosis on pancreas.  On Zosyn.  Ascites -  his liver has mild fatty infiltration on imaging, not cirrhotic at this point, this is likely due to pancreatitis and hypoalbuminemia.  Underwent paracentesis on 6/28 with 2.8 L fluid removed. Ordered repeat paracentesis (7/1) due to increased distention, but not enough fluid seen on ultrasound to do safely.  Large volume ascites seen on MRI abdomen (7/3), Continue Lasix and spironolactone.  Repeat paracentesis today (7/7) with 3 L of dark serous fluid removed.  Continue empiric antibiotics.  Elevated TSH - free T4 normal.    Chest pain - Resolved.  Reported by pt on AM of 7/1.  Troponin mildly elevated at 32, likely demand ischemia from persistent tachycardia in setting of alcohol withdrawal.  No acute ischemic changes on EKG, only non-specific T wave changes.  Monitor.  Hypervolemic hyponatremia -POA with sodium 124.  Resolved.  Suspect due to liver disease.  Monitor BMP.  Improving with diuretics.    Acute renal failure -Resolved.  Present on admission with creatinine 1.75.  Likely prerenal azotemia in the setting of above.  Improved with IV hydration.  Monitor BMP and urine output.  Hypokalemia -present on admission, replaced.  Monitor BMP, Mg, Phos and replace as needed.    Patient BMI: Body mass index is 23.58 kg/m.   DVT prophylaxis: SCDs Start: 05/03/20 2153   Diet:  Diet  Orders (From admission, onward)    Start     Ordered   05/15/20 1035  Diet regular Room service appropriate? Yes; Fluid consistency: Thin  Diet effective now       Comments: Please give 3 milk cartons with each meal.  Question Answer Comment  Room service appropriate? Yes   Fluid consistency: Thin      05/15/20 1035            Code Status: Full Code    Subjective 05/16/20    Patient seen this AM, wife at bedside.  She had met with palliative care, intensivist and neurologist earlier.  Patient says he hurts all over but provide any specifics.  Has been somewhat agitated and restless.      Disposition Plan & Communication   Status is: Inpatient  Remains inpatient appropriate because:Inpatient level of care appropriate due to severity of illness   Dispo: The patient is from: Home              Anticipated d/c is to: SNF              Anticipated d/c date is: > 3 days                Patient currently is not medically stable to d/c.   Family Communication: wife at bedside during encounter    Consults, Procedures, Significant Events   Consultants:   Neurology  Gastroenterology  General surgery  Palliative care  PCCM   Procedures:   Paracentesis 05/07/2020, 05/16/2020  Antimicrobials:   Meropenem (6/28 to 7/1)  Zosyn 7/4 >>    Objective   Vitals:   05/16/20 1500 05/16/20 1600 05/16/20 1700 05/16/20 1800  BP: 94/63 (!) 99/59 (!) 100/57 (!) 100/58  Pulse: 84 81 77 78  Resp: (!) 24 (!) 30 (!) 28 (!) 27  Temp: (!) 96.1 F (35.6 C) (!) 96.3 F (35.7 C) (!) 96.4 F (35.8 C) (!) 96.4 F (35.8 C)  TempSrc:  Rectal  Rectal  SpO2: 100% 100% 100% 100%  Weight:      Height:        Intake/Output Summary (Last 24 hours) at 05/16/2020 1902 Last data filed at 05/16/2020 1800 Gross per 24 hour  Intake 1786.11 ml  Output 270 ml  Net 1516.11 ml   Filed Weights   05/03/20 2232 05/16/20 0030  Weight: 63.5 kg 68.3 kg    Physical Exam:  General exam: awake, alert, no acute distress, underweight, chronically ill appearing HEENT: very dry mucus membranes, hearing grossly normal, pale lips  Respiratory system: shallow inspirations, CTAB, normal respiratory effort, no wheezes or rhonchi. Cardiovascular system: normal S1/S2, RRR, 2+ LE edema worse in the feet.   Gastrointestinal system: tensely distended but not tender, active bowel sounds, FMS in place with light brown liquid stool in bag. Central nervous system: Alert, oriented x 3, very soft vocal projection. No gross focal neurologic deficits.   Extremities: edematous feet, no cyanosis, moves all  extremities Skin: dry, intact, normal temperature, pale Psychiatric: normal mood with periods of anxiety, congruent affect  Labs   Data Reviewed: I have personally reviewed following labs and imaging studies  CBC: Recent Labs  Lab 05/10/20 0520 05/10/20 0520 05/11/20 0603 05/11/20 0603 05/12/20 0557 05/12/20 0557 05/13/20 0618 05/13/20 0618 05/14/20 0040 05/15/20 0205 05/15/20 2006 05/16/20 0331 05/16/20 1230  WBC 18.6*   < > 19.5*   < > 19.4*   < > 20.4*  --  20.3* 26.3* 34.2* 40.4*  --  NEUTROABS 15.3*  --  15.9*  --  16.4*  --  17.5*  --   --   --  31.7*  --   --   HGB 10.7*   < > 11.1*   < > 10.4*   < > 10.5*   < > 10.1* 10.3* 9.1* 7.9* 8.1*  HCT 30.1*   < > 31.2*   < > 29.5*   < > 30.3*   < > 28.6* 28.8* 26.4* 22.6* 23.8*  MCV 93.5   < > 92.0   < > 92.5   < > 94.4  --  93.2 90.6 95.0 95.4  --   PLT 205   < > 219   < > 239   < > 246  --  185 191 126* 151  --    < > = values in this interval not displayed.   Basic Metabolic Panel: Recent Labs  Lab 05/11/20 0603 05/11/20 0603 05/12/20 0557 05/12/20 0557 05/13/20 0618 05/14/20 0040 05/15/20 0205 05/15/20 2006 05/16/20 0331  NA 134*   < > 134*   < > 135 135 136 137 137  K 3.4*   < > 3.1*   < > 3.8 3.4* 3.8 3.6 3.9  CL 95*   < > 97*   < > 101 99 103 103 102  CO2 30   < > 27   < > '26 28 24 25 22  ' GLUCOSE 117*   < > 111*   < > 113* 141* 132* 147* 126*  BUN 9   < > 14   < > '16 17 20 ' 26* 29*  CREATININE 0.60*   < > 0.65   < > 0.56* 0.66 0.78 1.31* 1.42*  CALCIUM 7.8*   < > 7.8*   < > 7.9* 7.3* 6.1* 5.2* 5.3*  MG 1.7   < > 1.6*  --  1.7 2.0 1.8  --  1.9  PHOS 3.8  --  4.3  --  4.3  --  3.7  --  4.7*   < > = values in this interval not displayed.   GFR: Estimated Creatinine Clearance: 68.5 mL/min (A) (by C-G formula based on SCr of 1.42 mg/dL (H)). Liver Function Tests: Recent Labs  Lab 05/13/20 0618 05/14/20 0040 05/15/20 0205 05/15/20 2006 05/16/20 0331  AST 34 34 39 39 36  ALT '24 24 25 24 21  ' ALKPHOS 95  87 81 79 71  BILITOT 0.8 0.7 0.7 0.7 0.8  PROT 4.7* 4.4* 4.4* 4.1* 4.9*  ALBUMIN 1.7* 1.6* 1.5* 1.4* 2.8*   Recent Labs  Lab 05/10/20 0520 05/13/20 0618 05/14/20 0040 05/15/20 0205 05/16/20 0331  LIPASE 825* 1,543* 1,510* 1,141* 912*   Recent Labs  Lab 05/12/20 1117 05/15/20 2006  AMMONIA <9* 17   Coagulation Profile: Recent Labs  Lab 05/13/20 0910 05/14/20 0040  INR 1.1 1.1   Cardiac Enzymes: No results for input(s): CKTOTAL, CKMB, CKMBINDEX, TROPONINI in the last 168 hours. BNP (last 3 results) No results for input(s): PROBNP in the last 8760 hours. HbA1C: No results for input(s): HGBA1C in the last 72 hours. CBG: Recent Labs  Lab 05/14/20 0849 05/14/20 1655 05/14/20 2112 05/15/20 0823 05/15/20 1827  GLUCAP 177* 122* 156* 133* 139*   Lipid Profile: Recent Labs    05/16/20 1230  TRIG 45   Thyroid Function Tests: Recent Labs    05/15/20 2029  TSH 11.634*   Anemia Panel: No results for input(s): VITAMINB12, FOLATE, FERRITIN, TIBC, IRON,  RETICCTPCT in the last 72 hours. Sepsis Labs: Recent Labs  Lab 05/15/20 2223 05/16/20 0327  PROCALCITON  --  1.84  LATICACIDVEN 2.0* 1.1    Recent Results (from the past 240 hour(s))  Body fluid culture     Status: None   Collection Time: 05/07/20 12:00 PM   Specimen: PATH Cytology Peritoneal fluid  Result Value Ref Range Status   Specimen Description   Final    PERITONEAL Performed at [facility], [address], [place], [place] [address]    Special Requests   Final    PERITONEAL Performed at [facility], [address] [place], [place] [address]    Gram Stain   Final    WBC PRESENT,BOTH PMN AND MONONUCLEAR NO ORGANISMS SEEN CYTOSPIN SMEAR    Culture   Final    NO GROWTH 3 DAYS Performed at [facility], [place] [address] [place], [place] [address]    Report Status 05/11/2020 FINAL  Final  Culture, blood (single) w Reflex to ID Panel     Status: None   Collection  Time: 05/11/20  7:59 AM   Specimen: BLOOD  Result Value Ref Range Status   Specimen Description BLOOD LEFT WRIST  Final   Special Requests   Final    BOTTLES DRAWN AEROBIC AND ANAEROBIC Blood Culture adequate volume   Culture   Final    NO GROWTH 5 DAYS Performed at [facility], [address] [place], [place] [address]    Report Status 05/16/2020 FINAL  Final  CULTURE, BLOOD (ROUTINE X 2) w Reflex to ID Panel     Status: None (Preliminary result)   Collection Time: 05/15/20 11:52 PM   Specimen: Left Antecubital; Blood  Result Value Ref Range Status   Specimen Description LEFT ANTECUBITAL  Final   Special Requests   Final    BOTTLES DRAWN AEROBIC AND ANAEROBIC Blood Culture adequate volume   Culture   Final    NO GROWTH < 12 HOURS Performed at [facility], [address], [place], [place] [address]    Report Status PENDING  Incomplete  CULTURE, BLOOD (ROUTINE X 2) w Reflex to ID Panel     Status: None (Preliminary result)   Collection Time: 05/15/20 11:53 PM   Specimen: BLOOD LEFT HAND  Result Value Ref Range Status   Specimen Description BLOOD LEFT HAND  Final   Special Requests   Final    BOTTLES DRAWN AEROBIC AND ANAEROBIC Blood Culture adequate volume   Culture   Final    NO GROWTH < 12 HOURS Performed at [facility], [address] [place], [place] [address]    Report Status PENDING  Incomplete  MRSA PCR Screening     Status: None   Collection Time: 05/16/20 12:24 AM   Specimen: Nasopharyngeal  Result Value Ref Range Status   MRSA by PCR NEGATIVE NEGATIVE Final    Comment:        The GeneXpert MRSA Assay (FDA approved for NASAL specimens only), is one component of a comprehensive MRSA colonization surveillance program. It is not intended to diagnose MRSA infection nor to guide or monitor treatment for MRSA infections. Performed at [facility], [place]., [place],  [address]       Imaging Studies    CT ABDOMEN PELVIS WO CONTRAST  Result Date: 05/16/2020 CLINICAL DATA:  Acute, severe, pancreatitis EXAM: CT ABDOMEN AND PELVIS WITHOUT CONTRAST TECHNIQUE: Multidetector CT imaging of the abdomen  and pelvis was performed following the standard protocol without IV contrast. COMPARISON:  MRCP 05/12/2020 FINDINGS: Lower chest: Moderate left and small right pleural effusions with lower lobe atelectasis. Low-density blood pool suggesting anemia. Hepatobiliary: No focal liver abnormality.No evidence of biliary obstruction or stone. Pancreas: Peripancreatic edema may be improved. 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 The dominant pseudocyst at the uncinate process has decreased to 2.2 cm. A cyst in the midline pancreas is mildly increased to 15 mm. Spleen: Unremarkable. Adrenals/Urinary Tract: Negative adrenals. No hydronephrosis or stone. Collapsed bladder by Foley catheter Stomach/Bowel: Rectal tube in place. No bowel obstruction or definite inflammation. Vascular/Lymphatic: No acute vascular abnormality. No mass or adenopathy. Reproductive:No pathologic findings. Other: Ascites with areas of loculation around the liver, right gutter, and pelvis. The volume is not changed from comparison MRCP. The thickest pocket met measures 4 cm over the liver and 7 cm inferior to the liver. Reticulation of the omentum which is likely edema by prior MRI. Anasarca. Musculoskeletal: Healing lateral left sixth and seventh rib fractures. IMPRESSION: 1. Multiple pseudocysts. Compared to 05/12/2020, the largest pseudocyst in the uncinate process has decreased in size to 2.2 cm. A midline pseudocyst has mildly increased from prior. No acute inflammation is currently seen about the pancreas. 2. Large volume ascites with a degree of loculation along the liver, right gutter, and pelvis. 3. Left more than right pleural effusion with atelectasis, not progressed. Electronically Signed   By: Monte Fantasia M.D.   On: 05/16/2020 04:35   MR  BRAIN WO CONTRAST  Result Date: 05/15/2020 CLINICAL DATA:  Dural venous sinus thrombosis EXAM: MRI HEAD WITHOUT CONTRAST TECHNIQUE: Multiplanar, multiecho pulse sequences of the brain and surrounding structures were obtained without intravenous contrast. COMPARISON:  Brain MRI 05/12/2020 FINDINGS: Brain: There are areas of residual abnormal diffusion restriction over the right posterior parietal lobe and right cerebellum. No new diffusion abnormality. Normal white matter signal. Normal volume of CSF spaces. No chronic microhemorrhage. Vascular: Normal arterial flow voids. Skull and upper cervical spine: Normal marrow signal Sinuses/Orbits: Negative. Other: None. IMPRESSION: Residual abnormal diffusion restriction over the right posterior parietal lobe and right cerebellum, consistent with expected evolution of subacute venous infarcts. No new diffusion abnormality. Electronically Signed   By: Ulyses Jarred M.D.   On: 05/15/2020 22:55   US Paracentesis  Result Date: 05/16/2020 INDICATION: History of alcoholic cirrhosis, now with symptomatic ascites. Please perform ultrasound-guided paracentesis for therapeutic purposes. EXAM: ULTRASOUND-GUIDED PARACENTESIS COMPARISON:  CT abdomen pelvis-05/16/2020; ultrasound-guided paracentesis performed 05/07/2020 (yielding 2.8 L of peritoneal fluid) MEDICATIONS: None. COMPLICATIONS: None immediate. TECHNIQUE: Informed written consent was obtained from the patient after a discussion of the risks, benefits and alternatives to treatment. A timeout was performed prior to the initiation of the procedure. Initial ultrasound scanning demonstrates a moderate amount of ascites within the right lower abdomen which was subsequently prepped and draped in the usual sterile fashion. 1% lidocaine with epinephrine was used for local anesthesia. Under direct ultrasound guidance, a 19 gauge, 7-cm, Yueh catheter was introduced. An ultrasound image was saved for documentation purposed. The  paracentesis was performed. The catheter was removed and a dressing was applied. The patient tolerated the procedure well without immediate post procedural complication. FINDINGS: A total of approximately 3 liters of dark serous ascitic fluid was removed. IMPRESSION: Successful ultrasound-guided paracentesis yielding 3 liters of peritoneal fluid. Electronically Signed   By: Sandi Mariscal M.D.   On: 05/16/2020 15:55   DG Chest Port 1 View  Result Date: 05/15/2020 CLINICAL  DATA:  Status post PICC line placement EXAM: PORTABLE CHEST 1 VIEW COMPARISON:  05/03/2020 FINDINGS: Cardiac shadow is stable. Increasing right-sided pleural effusion and likely underlying atelectasis is present. New right-sided PICC line is noted with the tip in the mid right atrium. This could be withdrawn as much as 3 cm as necessary. Right lung is clear. Small right effusion is noted. No bony abnormality is seen. IMPRESSION: Bilateral pleural effusions left greater than right. Associated left basilar atelectasis/infiltrate is present. PICC line as described in the mid right atrium. This could be withdrawn as many as 3 cm as clinically indicated. Electronically Signed   By: Inez Catalina M.D.   On: 05/15/2020 19:27   [place] EKG SITE RITE  Result Date: 05/15/2020 If Site Rite image not attached, placement could not be confirmed due to current cardiac rhythm.    Medications   Scheduled Meds: . Chlorhexidine Gluconate Cloth  6 each Topical Daily  . Gerhardt's butt cream   Topical TID  . [START ON 05/17/2020] insulin aspart  0-9 Units Subcutaneous Q6H  . pantoprazole (PROTONIX) IV  40 mg Intravenous Q12H  . sodium chloride flush  3 mL Intravenous Q12H   Continuous Infusions: . sodium chloride Stopped (05/13/20 1628)  . heparin 1,150 Units/hr (05/16/20 1800)  . meropenem (MERREM) IV 1 g (05/16/20 1808)  . norepinephrine (LEVOPHED) Adult infusion 10 mcg/min (05/16/20 1800)  . norepinephrine (LEVOPHED) Adult infusion Stopped (05/16/20  0503)  . TPN ADULT (ION) 30 mL/hr at 05/16/20 1815       LOS: 13 days    Time spent: 40 minutes with > 50% spent in coordination of care and direct patient contact.   Ezekiel Slocumb, DO [facility]  05/16/2020, 7:02 PM    If 7PM-7AM, please contact night-coverage. How to contact the [facility] Attending or Consulting provider [place] or covering provider during after hours [place], for this patient?    1. Check the care team in [facility] and look for a) attending/consulting [facility] provider listed and b) the [facility] team listed 2. Log into [email or web address] and use [place]'s universal password to access. If you do not have the password, please contact the hospital operator. 3. Locate the [facility] provider you are looking for under [facility] and page to a number that you can be directly reached. 4. If you still have difficulty reaching the provider, please page the [facility] (Director on Call) for the Hospitalists listed on amion for assistance.

## 2020-05-16 NOTE — Progress Notes (Signed)
Pharmacy Antibiotic Note  Shane Delatte Sr. is a 34 y.o. male admitted on 05/03/2020 with sepsis.  Pharmacy has been consulted for Meropenem dosing.  Plan: Meropenem 1gm IV q8hrs  Height: 5\' 7"  (170.2 cm) Weight: 68.3 kg (150 lb 9.2 oz) IBW/kg (Calculated) : 66.1  Temp (24hrs), Avg:93.9 F (34.4 C), Min:90.9 F (32.7 C), Max:97.7 F (36.5 C)  Recent Labs  Lab 05/12/20 0557 05/12/20 0557 05/13/20 0618 05/14/20 0040 05/15/20 0205 05/15/20 2006 05/15/20 2223 05/16/20 0331  WBC 19.4*   < > 20.4* 20.3* 26.3* 34.2*  --  40.4*  CREATININE 0.65  --  0.56* 0.66 0.78 1.31*  --   --   LATICACIDVEN  --   --   --   --   --   --  2.0*  --    < > = values in this interval not displayed.    Estimated Creatinine Clearance: 74.3 mL/min (A) (by C-G formula based on SCr of 1.31 mg/dL (H)).    Allergies  Allergen Reactions  . Sudafed [Pseudoephedrine]     History per pt of this being linked with his seizures    Antimicrobials this admission:   >>    >>   Dose adjustments this admission:   Microbiology results:  BCx:   UCx:    Sputum:    MRSA PCR:   Thank you for allowing pharmacy to be a part of this patient's care.  07/17/20 A 05/16/2020 3:59 AM

## 2020-05-16 NOTE — Procedures (Signed)
Pre Procedural Dx: Symptomatic Ascites Post Procedural Dx: Same  Successful US guided paracentesis yielding 3 L of dark serous ascitic fluid.  EBL: None Complications: None immediate  Katherina Right, MD Pager #: [phone]

## 2020-05-16 NOTE — Progress Notes (Signed)
Had a conversation with the patient's family today about the poor prognosis due to his multiple medical problems and hypotension.  All questions were answered and agree with continued supportive care.  Nothing further to do from a GI point of view.

## 2020-05-16 NOTE — Progress Notes (Signed)
OT Cancellation Note  Patient Details Name: Shane Faucett Sr. MRN: [id number] DOB: 02/04/1986   Cancelled Treatment:    Reason Eval/Treat Not Completed: Patient not medically ready;Medical issues which prohibited therapy. Chart reviewed. Pt transferred to ICU overnight 2/2 change in status. Ca+ noted to be critically low as well (5.3) and contraindicated for therapy. Will complete current order. Please re-consult therapy once pt is medically appropriate.    Richrd Prime, MPH, MS, OTR/L ascom [phone] 05/16/20, 7:58 AM

## 2020-05-16 NOTE — Progress Notes (Signed)
CH encountered pt.'s fiance at ICU RN station; fiance asked in [facility] could come to rm. --> pt. has asked for chaplain to pray for him to be 'saved.'  When [facility] visited rm. pt. lying in bed asleep and unable to be awakened by fiance.  Fiance shared she had family had meeting w/medical team earlier today --> pt.'s prognosis is fragile.  Fiance asked about visitor policy and CH helped clarify.  Fiance says she is doing OK for the moment but needs to get more sleep.  CH left on-call phone # in case pt. wakes up later today and would like to talk.  CH will follow up tomorrow.    05/16/20 0100  Clinical Encounter Type  Visited With Family  Visit Type Follow-up  Spiritual Encounters  Spiritual Needs Emotional;Grief support  Stress Factors  Family Stress Factors Major life changes;Loss of control;Health changes

## 2020-05-16 NOTE — Progress Notes (Addendum)
Nutrition Follow-up  DOCUMENTATION CODES:   Severe malnutrition in context of social or environmental circumstances  INTERVENTION:  Initiate TPN per pharmacy.  Monitor magnesium, potassium, and phosphorus daily for at least 3 days, MD to replete as needed, as pt is at risk for refeeding syndrome given severe malnutrition.  NUTRITION DIAGNOSIS:   Severe Malnutrition related to social / environmental circumstances (EtOH abuse) as evidenced by severe fat depletion, severe muscle depletion.  Ongoing.  GOAL:   Patient will meet greater than or equal to 90% of their needs  Not met.  MONITOR:   PO intake, Supplement acceptance, Diet advancement, Labs, Weight trends, TF tolerance, I & O's  REASON FOR ASSESSMENT:   Consult Assessment of nutrition requirement/status, Other (Comment), New TPN/TNA  ASSESSMENT:   34 year old male with PMHx of EtOH abuse admitted with alcohol withdrawal, acute alcoholic pancreatitis, acute metabolic encephalopathy, ascites s/p paracentesis on 6/28 with 2.8 L fluid removed, chest pain, acute renal failure.   -Palliative medicine was consulted to discuss goals of care. -Overnight patient was transferred to ICU overnight for lethargy and hypotension.  Met with patient at bedside but he was lethargic and unable to provide any history. Patient is not currently safe for PO intake. Discussed with RN.  Had discussed with RN yesterday on the floor and patient continued to have poor PO intake. He was taking sips of liquids. Also discussed with MD. RD recommended placement of post-pyloric small-bore feeding tube (10 Fr. Dobbhoff) under fluoroscopy. However, there was concern that patient would not be able to keep tube in without having to use restraints. After MD discussed with family yesterday plan was for initiation of short-term TPN.   IV Access: right basilic double lumen PICC placed 7/6  Medications reviewed and include: folic acid 1 mg daily, Lasix 40 mg  daily, Protonix, Florastor 250 mg BID, spironolactone, thiamine 100 mg daily, calcium gluconate 2 grams IV once today, heparin gtt, meropenem, norepinephrine gtt at 20 mcg/min.  Labs reviewed: BUN 29, Creatinine 1.42, Calcium 5.3 (corrects to 6.26 with albumin of 2.8).  I/O: no documented UOP yesterday; 120 mL output from rectal tube yesterday  Weight trend: 68.3 kg on 7/7; +4.8 kg from 6/24  Diet Order:   Diet Order            Diet regular Room service appropriate? Yes; Fluid consistency: Thin  Diet effective now                EDUCATION NEEDS:   No education needs have been identified at this time  Skin:  Skin Assessment: Reviewed RN Assessment  Last BM:  05/16/2020 - 120 mL type 7 per rectal tube  Height:   Ht Readings from Last 1 Encounters:  05/16/20 _0  (1.702 m)   Weight:   Wt Readings from Last 1 Encounters:  05/16/20 68.3 kg   Ideal Body Weight:  83.6 kg  BMI:  Body mass index is 23.58 kg/m.  Estimated Nutritional Needs:   Kcal:  1900-2200  Protein:  95-105 grams  Fluid:  1.9-2.2 L/day  Jacklynn Barnacle, MS, RD, LDN Pager number available on Amion

## 2020-05-16 NOTE — Progress Notes (Signed)
Cross Cover Brief Note  Beginning of shift reported patient with increased lethargy and inability to obtain temp. Bedside patient is arousable to noxious stimuli and weak attempt to follow commands in all 4 extremities. PERRL. Unable to obtain temp. Placed on bear  MRI negative for acute changes.  CMP low K replaced, Calcium supplemented. CBC with increased  Wbc - lactic at 2.0.  Blood cultures repeated .  Meropenem added   Became hypotensive and treansferrred to ICU .with levophed therapy UA without indications for UTI  CT abdomen/pelvis  IMPRESSION: Edema adjacent to head and body of pancreas question pancreatitis; recommend correlation with serum lipase/amylase levels.  Cystic lesions at the pancreas, largest at head 4.4 cm diameter, could represent pseudocysts in a patient with pancreatitis or cystic pancreatic neoplasms; recommend attention on follow-up imaging to ensure resolution, or may characterize by MR.  Moderate ascites with mild fatty infiltration of liver.  Small BILATERAL pleural effusions and minimal bibasilar atelectasis.  Small hiatal hernia.  Umbilical and BILATERAL inguinal hernias.  Prominent small bowel loops with mild wall edema in the mid abdomen and pelvis, nonspecific, could be related to enteritis or ascites.  Question mild rectal wall thickening and hypervascularity cannot exclude proctitis.  Aortic Atherosclerosis (ICD10-I70.0).   CCM consult with vasopressor therapy

## 2020-05-16 NOTE — Progress Notes (Signed)
CRITICAL VALUE ALERT  Critical Value:  Ca 5.3  Date & Time Notied:  05/16/20  Provider Notified: Loney Laurence NP   Orders Received/Actions taken: pending

## 2020-05-16 NOTE — Consult Note (Signed)
ANTICOAGULATION CONSULT NOTE  Pharmacy Consult for Heparin infusion  Indication: cerebral venous thrombosis  Patient Measurements: Height: 5\' 7"  (170.2 cm) Weight: 68.3 kg (150 lb 9.2 oz) IBW/kg (Calculated) : 66.1  Vital Signs: Temp: 96.4 F (35.8 C) (07/07 1800) Temp Source: Rectal (07/07 1800) BP: 100/58 (07/07 1800) Pulse Rate: 78 (07/07 1800)  Labs: Recent Labs    05/14/20 0040 05/14/20 1004 05/15/20 0205 05/15/20 0757 05/15/20 1447 05/15/20 2006 05/15/20 2006 05/15/20 2200 05/16/20 0331 05/16/20 0802 05/16/20 1230 05/16/20 1812  HGB 10.1*  --  10.3*  --   --  9.1*   < >  --  7.9*  --  8.1*  --   HCT 28.6*  --  28.8*  --   --  26.4*  --   --  22.6*  --  23.8*  --   PLT 185  --  191  --   --  126*  --   --  151  --   --   --   LABPROT 13.8  --   --   --   --   --   --   --   --   --   --   --   INR 1.1  --   --   --   --   --   --   --   --   --   --   --   HEPARINUNFRC 0.20*   < > 0.32   < >   < >  --   --  0.20*  --  0.23*  --  0.31  CREATININE 0.66  --  0.78  --   --  1.31*  --   --  1.42*  --   --   --   TROPONINIHS  --   --   --   --   --   --   --   --  16  --   --   --    < > = values in this interval not displayed.    Estimated Creatinine Clearance: 68.5 mL/min (A) (by C-G formula based on SCr of 1.42 mg/dL (H)).   Medical History: Past Medical History:  Diagnosis Date  . Alcohol use disorder, severe, dependence (HCC)    Assessment: Pharmacy consulted for heparin infusion dosing and monitoring for 34 yo male for cerebral venous thrombosis.   CT Head: "Confirmed thrombus within the confluence of sinuses and left transverse dural venous sinus. 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 No evidence of hemorrhagic conversion." H&H continue to trend down, platelets have normalized  07/05 @ 0040 HL 0.20 subtherapeutic - rate increased to 1150 units/hr  07/05 @ 1004 HL 0.70 supratherapeutic - rate decreased to 1000 units/hr 07/05 @  1804 HL < 0.10: inc to 1150 units/hr 07/06 @ 0205 HL 0.32: no change 07/06 @ 0757 HL 0.54: decreased to 900 units/hr 07/06 @ 2200 HL 0.20: increased to 1100 units/hr 07/07 @ 0802 HL 0.23: increased to 1150 units/hr  Goal of Therapy:  Heparin level 0.3-0.5 units/ml Monitor platelets by anticoagulation protocol: Yes   Plan:   Heparin level subtherapeutic: increase rate very slightly (based on previous response) to 1150 units/hr   recheck heparin level in 6 hours  F/U CBC in am  7/7 : HL @ 1812 = 0.31 Will continue pt on current rate and recheck HL on 7/8 @ 0000.  9/8, PharmD Clinical Pharmacist 05/16/2020 8:14 PM

## 2020-05-16 NOTE — Consult Note (Signed)
Name: Shane Santistevan Sr. MRN: [id number] DOB: 1986-05-08    ADMISSION DATE:  05/03/2020 CONSULTATION DATE:  05/16/2020  REFERRING MD :  Shane Schwartz, NP  CHIEF COMPLAINT:  Hypotension, Hypothermia  BRIEF PATIENT DESCRIPTION:  34 y.o. Male admitted 05/03/20 with Acute Alcoholic Pancreatitis, ETOH withdrawal, and Acute Right Cerebellum infarct.  Late in the evening on 05/15/20, pt became hypotensive and hypothermic requiring transfer to ICU. Concern for developing septic shock.   STUDIES/ SIGNIFICANT EVENTS  6/24: CT Head>>1. No acute intracranial abnormality. 2. Generalized cerebral and cerebellar atrophy, age advanced. 6/25: CT Abdomen/Pelvis>>Edema adjacent to head and body of pancreas question pancreatitis; recommend correlation with serum lipase/amylase levels. Cystic lesions at the pancreas, largest at head 4.4 cm diameter, could represent pseudocysts in a patient with pancreatitis or cystic pancreatic neoplasms; recommend attention on follow-up imaging to ensure resolution, or may characterize by MR. Moderate ascites with mild fatty infiltration of liver. Small BILATERAL pleural effusions and minimal bibasilar atelectasis. Small hiatal hernia. Umbilical and BILATERAL inguinal hernias. Prominent small bowel loops with mild wall edema in the mid abdomen and pelvis, nonspecific, could be related to enteritis or ascites. Question mild rectal wall thickening and hypervascularity cannot exclude proctitis. Aortic Atherosclerosis 6/27: KUB>>There is contrast in the colon extending to the level the rectum. No small bowel dilatation. No free air, portal venous gas, or pneumatosis. No other acute abnormalities are identified. 6/28: US Paracentesis>>Successful ultrasound-guided paracentesis yielding 2.8 liters of peritoneal fluid. 7/1: US Abdomen Limited>>Only a small amount of ascites noted. Paracentesis was not performed. Follow-up exam can be obtained. 7/3: MR Brain>>1. Mildly motion  degraded study which had to be discontinued prior to completion. 2. 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 Head MRV without and with contrast would best evaluate further. Alternatively, CT Venogram (CT head with contrast using venogram protocol) could be pursued if the patient is unable to cooperate with MRI. 3. Associated abnormal diffusion in the parietal lobes, right cerebellum, and possibly also the left occipital pole which may reflect a combination of venous infarcts and postictal changes. 4. No associated hemorrhage or mass effect. 5. Underlying severely age advanced volume loss of the brainstem and Cerebellum 7/3: MR Abdomen>>1. Multiple redemonstrated fluid signal lesions within the pancreatic head and neck, the largest in the pancreatic head adjacent to the distal pancreatic duct measuring 3.8 cm. There may be some adjacent peripancreatic inflammatory stranding, difficult to discern in the setting of large volume ascites. 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 No pancreatic ductal dilatation. 2. No obvious, overt morphologic stigmata of cirrhosis. No evidence of hepatic mass or other parenchymal abnormality on noncontrast imaging. 3. Large volume ascites throughout the abdomen. 4. Moderate bilateral pleural effusions 7/5: CT Head wo Contrast>>1. Expected CT appearance of small infarct in the posterior rightcerebellum. And subtle evidence of infarct in the posterior right parietal lobe, making postictal explanation of the DWI abnormality there less likely. 2. No associated hemorrhage or mass effect. 3. Patency of the torcula and left transverse sinus not evaluated in the absence of contrast. 4. No new intracranial abnormality. 7/6: CXR>>Bilateral pleural effusions left greater than right. Associated left basilar atelectasis/infiltrate is present. PICC line as  described in the mid right atrium. This could be withdrawn as many as 3 cm as clinically indicated. 7/6: MR Brain>>Residual abnormal diffusion restriction over the right posterior parietal lobe and right cerebellum, consistent with expected evolution of subacute venous infarcts. No new  diffusion abnormality. 7/7: CT Abdomen/Pelvis>>1. Multiple pseudocysts. Compared to 05/12/2020, the largest pseudocyst in the uncinate process has decreased in size to 2.2 cm. A midline pseudocyst has mildly increased from prior. No acute inflammation is currently seen about the pancreas. 2. Large volume ascites with a degree of loculation along the liver, right gutter, and pelvis. 3. Left more than right pleural effusion with atelectasis, not Progressed. 7/7: 2D Echocardiogram>>  CULTURES: SARS-CoV-2 PCR 6/24>> negative HIV screen 6/24>> negative MRSA PCR 6/24>> negative Peritoneal fluid culture 6/28>> Blood cultures 7/2>> Blood cultures x2 7/6>> Urine 7/7>>  ANTIBIOTICS: Zosyn 7/4>>7/7 Meropenem 7/7>>  HISTORY OF PRESENT ILLNESS:   Shane Jenkins is a 34 year old male with a past medical history significant for alcohol abuse who [facility] on [facility]03/2020 due to progressive confusion with subsequent unresponsiveness.  Patient's significant other reports he has a history of chronic alcohol use and drinks 2 12 packs of beer daily since about the age of 34.  Reports he suddenly stopped drinking on 6/23 as he did not have any money to obtain more alcohol.  Since then he has been having increased confusion, and was able to be convinced to come to the ED for further evaluation.  Significant other reported that he had been having diarrhea with dark-colored stool, and has had very poor p.o. intake (only intake his alcohol).  While in the ED triage area he had unresponsive episode lasting approximately 30 seconds of which he was suspected of having a seizure with subsequent postictal state.  He was given 2  mg IV Ativan and placed on CIWA protocol.  Chest x-ray was normal, CT head was normal.  CT abdomen and pelvis was consistent with pancreatitis with peripancreatic edema and multiple lesions that are most likely pseudocyst, moderate ascites with mild fatty infiltration of the liver, and small bilateral pleural effusions.  He was admitted to the MedSurg unit by hospitalist service for further work-up and treatment of acute alcoholic pancreatitis, acute alcohol withdrawal, and possible SBP (which was later ruled out).    He was noted to have abdominal pain and worsening distention, of which general surgery was consulted.  Intra-abdominal pressure monitoring <20 with stable hemodynamics.  Paracentesis was performed on 6/28 which yielded 2.8 L ascitic fluid with elevated neutrophils.  On 7/1 neurology was consulted due to altered mental status.  MRI brain obtained which was suspicious for dural venous sinus thrombosis, along with abnormal diffusion in both parietal lobes, right cerebellum and possibly left occipital lobe (infarcts vs post-ictal changes).  CT venogram of the head was obtained which confirmed venous sinus thrombosis, along with acute small infarct in the right cerebellum without evidence of hemorrhagic conversion.  Neurology recommended initiating heparin drip.  Late in the evening on 05/15/20, he became hypotensive, hypothermic, with worsening Leukocytosis and development of lactic acidosis.  He was transferred to ICU early on 05/16/20 due to concern of developing septic shock, and need for vasopressors.  PCCM is consulted. Pt found to have Lactic acid 2.0, procalcitonin 1.84, WBC 40.4, hemoglobin 7.9.  Sepsis workup in progress.  CT abdomen/Pelvis showed no acute inflammation about the pancreas, did show large volume ascites, along with bilateral pleural effusions.    PAST MEDICAL HISTORY :   has a past medical history of Alcohol use disorder, severe, dependence (HCC).  has no past surgical  history on file. Prior to Admission medications   Not on File   Allergies  Allergen Reactions   Sudafed [Pseudoephedrine]  History per pt of this being linked with his seizures    FAMILY HISTORY:  family history includes Alcohol abuse in his mother. SOCIAL HISTORY:  reports that he has been smoking. He has never used smokeless tobacco. He reports current alcohol use. He reports previous drug use.   COVID-19 DISASTER DECLARATION:  FULL CONTACT PHYSICAL EXAMINATION WAS NOT POSSIBLE DUE TO TREATMENT OF COVID-19 AND  CONSERVATION OF PERSONAL PROTECTIVE EQUIPMENT, LIMITED EXAM FINDINGS INCLUDE-  Patient assessed or the symptoms described in the history of present illness.  In the context of the Global COVID-19 pandemic, which necessitated consideration that the patient might be at risk for infection with the SARS-CoV-2 virus that causes COVID-19, Institutional protocols and algorithms that pertain to the evaluation of patients at risk for COVID-19 are in a state of rapid change based on information released by regulatory bodies including the CDC and federal and state organizations. These policies and algorithms were followed during the patient's care while in hospital.  REVIEW OF SYSTEMS:   Unable to assess due to AMS  SUBJECTIVE:  Unable to assess due to AMS  VITAL SIGNS: Temp:  [90.9 F (32.7 C)-97.7 F (36.5 C)] 90.9 F (32.7 C) (07/07 0030) Pulse Rate:  [63-127] 78 (07/07 0030) Resp:  [15-30] 25 (07/07 0030) BP: (75-149)/(52-115) 75/52 (07/07 0030) SpO2:  [98 %-100 %] 100 % (07/07 0030) Weight:  [68.3 kg] 68.3 kg (07/07 0030)  PHYSICAL EXAMINATION: General:  Acutely ill appearing male, laying in bed, on room air, with mild tachypnea (RR 20's) Neuro:  Lethargic, arouses to voice (but drifts off to sleep), unable to follow commands HEENT:  Atraumatic, normocephalic, neck supple, no JVD Cardiovascular:  Regular rate & rhythm, s1s2, no M/R/G Lungs:  Clear diminished to  auscultation bilaterally, no wheezing or rales, mild tachypnea, even Abdomen:  Distended, taut, no guarding or rebound tenerness Musculoskeletal:  No deformities, 2+ bilateral feet edema Skin:  Cool and dry.  No obvious rashes, lesions, or ulcerations  Recent Labs  Lab 05/14/20 0040 05/15/20 0205 05/15/20 2006  NA 135 136 137  K 3.4* 3.8 3.6  CL 99 103 103  CO2 28 24 25   BUN 17 20 26*  CREATININE 0.66 0.78 1.31*  GLUCOSE 141* 132* 147*   Recent Labs  Lab 05/14/20 0040 05/15/20 0205 05/15/20 2006  HGB 10.1* 10.3* 9.1*  HCT 28.6* 28.8* 26.4*  WBC 20.3* 26.3* 34.2*  PLT 185 191 126*   EEG  Result Date: 05/14/2020 07/15/2020, MD     05/14/2020  2:01 PM ELECTROENCEPHALOGRAM REPORT Patient: 07/15/2020 Sr.       Room #: 109A-AA EEG No. ID: [id number] Age: 34 y.o.        Sex: male Requesting Physician: 20 Report Date:  05/14/2020       Interpreting Physician: 07/15/2020 History: Benedicto Capozzi Sr. is an 34 y.o. male with altered mental status Medications: Heparin, Lipitor, COreg, Zosyn, Aldactone, Thiamine Conditions of Recording:  This is a 21 channel routine scalp EEG performed with bipolar and monopolar montages arranged in accordance to the international 10/20 system of electrode placement. One channel was dedicated to EKG recording. The patient is in the awake state. Description:  Artifact is prominent during the recording often obscuring the background rhythm due to a low voltage beta activity that is diffusely distributed.  When able to be visualized the background is slow and poorly organized and consists of activity in the delta theta continuum that is seen diffusely.  This activity is  continuous throughout the recording. No epileptiform activity is noted. Hyperventilation was not performed.  Intermittent photic stimulation was performed but failed to illicit any change in the tracing. IMPRESSION: This is an abnormal EEG secondary to general background slowing.  This finding may  be seen with a diffuse disturbance that is etiologically nonspecific, but may include a metabolic encephalopathy, among other possibilities.  No epileptiform activity is noted.  Thana Farr, MD Neurology [phone] 05/14/2020, 1:56 PM   CT HEAD WO CONTRAST  Result Date: 05/14/2020 CLINICAL DATA:  34 year old male with encephalopathy found to have dural venous sinus thrombosis and posterior circulation infarcts and/or postictal changes on recent MRI and CT venogram. ETOH abuse, initially presenting with DTs. EXAM: CT HEAD WITHOUT CONTRAST TECHNIQUE: Contiguous axial images were obtained from the base of the skull through the vertex without intravenous contrast. COMPARISON:  CTP and head MRI 05/12/2020. head CT 05/03/2020. FINDINGS: Brain: Mild hypodensity in the posterior right cerebellum corresponding to the diffusion abnormality compatible with small infarct. No hemorrhage or mass effect (series 4, image 54). There is also subtle hypodensity in the posterior right parietal lobe corresponding to some of the other abnormal diffusion along the posterior cerebral hemispheres (series 2, image 23). No acute intracranial hemorrhage identified. No midline shift, mass effect, or evidence of intracranial mass lesion. No ventriculomegaly. Generalized cerebral volume loss again noted. No other abnormal gray-white matter differentiation. Vascular: No suspicious intracranial vascular hyperdensity. Patency of the torcula and left transverse sinus not evaluated in the absence of contrast. Skull: No acute osseous abnormality identified. Sinuses/Orbits: Visualized paranasal sinuses and mastoids are stable and well pneumatized. Other: Visualized orbits and scalp soft tissues are within normal limits. IMPRESSION: 1. Expected CT appearance of small infarct in the posterior right cerebellum. And subtle evidence of infarct in the posterior right parietal lobe, making postictal explanation of the DWI abnormality there less likely.  2. No associated hemorrhage or mass effect. 3. Patency of the torcula and left transverse sinus not evaluated in the absence of contrast. 4. No new intracranial abnormality. Electronically Signed   By: Odessa Fleming M.D.   On: 05/14/2020 12:45   MR BRAIN WO CONTRAST  Result Date: 05/15/2020 CLINICAL DATA:  Dural venous sinus thrombosis EXAM: MRI HEAD WITHOUT CONTRAST TECHNIQUE: Multiplanar, multiecho pulse sequences of the brain and surrounding structures were obtained without intravenous contrast. COMPARISON:  Brain MRI 05/12/2020 FINDINGS: Brain: There are areas of residual abnormal diffusion restriction over the right posterior parietal lobe and right cerebellum. No new diffusion abnormality. Normal white matter signal. Normal volume of CSF spaces. No chronic microhemorrhage. Vascular: Normal arterial flow voids. Skull and upper cervical spine: Normal marrow signal Sinuses/Orbits: Negative. Other: None. IMPRESSION: Residual abnormal diffusion restriction over the right posterior parietal lobe and right cerebellum, consistent with expected evolution of subacute venous infarcts. No new diffusion abnormality. Electronically Signed   By: Deatra Robinson M.D.   On: 05/15/2020 22:55   DG Chest Port 1 View  Result Date: 05/15/2020 CLINICAL DATA:  Status post PICC line placement EXAM: PORTABLE CHEST 1 VIEW COMPARISON:  05/03/2020 FINDINGS: Cardiac shadow is stable. Increasing right-sided pleural effusion and likely underlying atelectasis is present. New right-sided PICC line is noted with the tip in the mid right atrium. This could be withdrawn as much as 3 cm as necessary. Right lung is clear. Small right effusion is noted. No bony abnormality is seen. IMPRESSION: Bilateral pleural effusions left greater than right. Associated left basilar atelectasis/infiltrate is present. PICC line as described in  the mid right atrium. This could be withdrawn as many as 3 cm as clinically indicated. Electronically Signed   By: Alcide Clever M.D.   On: 05/15/2020 19:27   [place] EKG SITE RITE  Result Date: 05/15/2020 If Site Rite image not attached, placement could not be confirmed due to current cardiac rhythm.   ASSESSMENT / PLAN:  Hypotension, suspect Septic shock -Continuous cardiac monitoring -Maintain MAP >65 -Cautious IV Fluids given bilateral Pleural effusions -Vasopressors as needed to maintain MAP goal -Currently receiving Albumin -Check Troponin ~ 16 -Trend lactic acid -Check cortisol -Obtain 2D Echocardiogram   Sepsis (as evidenced by Hypothermia, Hypotension, lactic acidosis, Increasing Leukocytosis) Acute Alcoholic Pancreatitis Ascites -Monitor fever curve -Trend WBCs and procalcitonin -Follow cultures as above -Zosyn changed to Meropenem by Hospitalist -Repeat CT abdomen to rule out necrotizing pancreatitis -Trend Lipase -CXR without obvious infiltrate -Recheck UA~ without obvious UTI -GI and general surgery are following, appreciate input  -Underwent Paracentesis on 6/28; was to undergo repeat paracentesis on 7/1 but not enough fluid seen on [place] to perform; Large volume ascites noted on MR Abdomen 7/3  -Will need repeat Paracentesis  Acute metabolic encephalopathy Alcohol Withdrawal  Acute Right Cerebellum infarct -Provide supportive care -Aspiration precautions -Pt is currently maintain his airway -MRI brain 7/6 with expected evolution of subacute infarct, no new abnormalities noted -Neurology following, appreciate input -Continue ASA & Statin -Completed Librium taper -CIWA protocol as needed -Continue thiamine and folic acid  Venous sinus Thrombosis -Neurology following, appreciate input -Continue Heparin gtt -MR Head 7/6 without any acute hemorrhage  AKI -Monitor I&O's / urinary output -Follow BMP -Ensure adequate renal perfusion -Avoid nephrotoxic agents as able -Replace electrolytes as indicated  Anemia -Monitor for S/Sx of bleeding -Trend CBC -Currently on Heparin gtt  for Venous sinus thrombosis; with worsening anemia will have to discuss with Neurology about d/c Heparin -Transfuse for Hgb <7      Pt is critically ill with multiorgan failure.  Prognosis is guarded, he is at high risk of cardiac arrest and death.  Currently he is maintaining his airway, but is at high risk for intubation.  Palliative Care is consulted of GOC discussion.   BEST PRACTICES DISPOSITION: ICU GOALS OF CARE: Full Code VTE PROPHYLAXIS/ANTICOAGULATION: Heparin gtt CONSULTS: GI, General Surgery, Neurology, Palliative Care UPDATES: Updated pt's wife and mother at bedside 05/16/20  Harlon Ditty, AGACNP-BC [facility] Pager: [phone]  05/16/2020, 12:55 AM

## 2020-05-16 NOTE — Consult Note (Signed)
Consultation Note Date: 05/16/2020   Patient Name: Shane Voiles Sr.  DOB: April 15, 1986  MRN: [id number]  Age / Sex: 34 y.o., male  PCP: [name], MD Referring Physician: Pennie Banter, DO  Reason for Consultation: Establishing goals of care  HPI/Patient Profile: Shane Bulson Sr. is a 34 y.o. male with medical history significant for alcohol use disorder who presents to the ED for evaluation of progressive confusion with subsequent unresponsive episode while in the triage area.  Clinical Assessment and Goals of Care: Patient is resting in bed with wife at bedside.  He grimaces with pain, and appears to be uncomfortable in bed, pulling at lines and tubes, and tries to remove the bear hugger.  He states he has pain all over his body.  His wife states that physicians have just came into the room to discuss his status and prognosis.  She is tearful.  She states that she wishes that her husband would make a decision on his care moving forward.  Kathie Rhodes discusses his mother and father's death and how that contributed to his increased drinking, but states that he began drinking at 34 years old.  She states he never got over the fact that he was not present when his mother or father died.    She states that he was doing well until about 3 months ago where he began to have problems with getting around and appeared intoxicated even when he was not.  She states she tried to get him to see a doctor but he would not.  She states that as of 1 month ago he really began to have difficulty with being able to walk and with his mental status.  We discussed his diagnosis, prognosis, GOC, EOL wishes disposition and options.  A detailed discussion was had today regarding advanced directives.  Concepts specific to code status, artifical feeding and hydration, IV antibiotics and rehospitalization were discussed.  The difference  between an aggressive medical intervention path and a comfort care path was discussed.  Values and goals of care important to patient and family were attempted to be elicited.  Discussed limitations of medical interventions to prolong quality of life in some situations and discussed the concept of human mortality.    Patient states that he wants his wife to be happy and wants her to make the decisions on his care moving forward.  She states that she wants him to make the decisions on his care, but that she does not want him to suffer.  She tries many times to get him to make a decision.  She worries that the quality of life following this hospitalization would not be acceptable.  He begins to discuss his possessions and concerns over what will happen to them after he dies.  Patient's mother-in-law arrived at bedside.  She seeks her mother's advice on how to care for her husband.  She is considering shifting to comfort focused care and possibly getting her home husband home with hospice.  She would like to continue speaking  with patient and her mother about options.  SUMMARY OF RECOMMENDATIONS   Full code/full scope at this time.  Patient and wife considering transition to comfort focused care.  Prognosis:   Poor overall.       Primary Diagnoses: Present on Admission: . Hyponatremia . Hypokalemia . AKI (acute kidney injury) (HCC) . Elevated CK . Heme positive stool . Thrombocytopenia (HCC) . Acute alcoholic pancreatitis . Acute metabolic encephalopathy . Severe protein-calorie malnutrition (HCC) . Sinus tachycardia   I have reviewed the medical record, interviewed the patient and family, and examined the patient. The following aspects are pertinent.  Past Medical History:  Diagnosis Date  . Alcohol use disorder, severe, dependence (HCC)    Social History   Socioeconomic History  . Marital status: Divorced    Spouse name: Not on file  . Number of children: Not on file  .  Years of education: Not on file  . Highest education level: Not on file  Occupational History  . Not on file  Tobacco Use  . Smoking status: Current Every Day Smoker  . Smokeless tobacco: Never Used  Substance and Sexual Activity  . Alcohol use: Yes    Comment: 24 beers/day since age 42.  . Drug use: Not Currently  . Sexual activity: Not on file  Other Topics Concern  . Not on file  Social History Narrative  . Not on file   Social Determinants of Health   Financial Resource Strain:   . Difficulty of Paying Living Expenses:   Food Insecurity:   . Worried About Programme researcher, broadcasting/film/video in the Last Year:   . Barista in the Last Year:   Transportation Needs:   . Freight forwarder (Medical):   Marland Kitchen Lack of Transportation (Non-Medical):   Physical Activity:   . Days of Exercise per Week:   . Minutes of Exercise per Session:   Stress:   . Feeling of Stress :   Social Connections:   . Frequency of Communication with Friends and Family:   . Frequency of Social Gatherings with Friends and Family:   . Attends Religious Services:   . Active Member of Clubs or Organizations:   . Attends Banker Meetings:   Marland Kitchen Marital Status:    Family History  Problem Relation Age of Onset  . Alcohol abuse Mother    Scheduled Meds: . Chlorhexidine Gluconate Cloth  6 each Topical Daily  . Gerhardt's butt cream   Topical TID  . [START ON 05/17/2020] insulin aspart  0-9 Units Subcutaneous Q6H  . pantoprazole (PROTONIX) IV  40 mg Intravenous Q12H  . sodium chloride flush  3 mL Intravenous Q12H   Continuous Infusions: . sodium chloride Stopped (05/13/20 1628)  . heparin 1,150 Units/hr (05/16/20 1200)  . meropenem (MERREM) IV 200 mL/hr at 05/16/20 1200  . norepinephrine (LEVOPHED) Adult infusion 15 mcg/min (05/16/20 1200)  . norepinephrine (LEVOPHED) Adult infusion Stopped (05/16/20 0503)  . TPN ADULT (ION)     PRN Meds:.sodium chloride, hydrALAZINE, HYDROmorphone (DILAUDID)  injection, labetalol, loperamide, LORazepam, ondansetron **OR** ondansetron (ZOFRAN) IV Medications Prior to Admission:  Prior to Admission medications   Not on File   Allergies  Allergen Reactions  . Sudafed [Pseudoephedrine]     History per pt of this being linked with his seizures   Review of Systems  Musculoskeletal: Positive for arthralgias.    Physical Exam Pulmonary:     Effort: Pulmonary effort is normal.  Neurological:  Mental Status: He is alert.     Vital Signs: BP (!) 89/59   Pulse 80   Temp (!) 94.6 F (34.8 C)   Resp 19   Ht 5\' 7"  (1.702 m)   Wt 68.3 kg   SpO2 98%   BMI 23.58 kg/m  Pain Scale: CPOT   Pain Score: 0-No pain   SpO2: SpO2: 98 % O2 Device:SpO2: 98 % O2 Flow Rate: .   IO: Intake/output summary:   Intake/Output Summary (Last 24 hours) at 05/16/2020 1348 Last data filed at 05/16/2020 1200 Gross per 24 hour  Intake 1596.2 ml  Output 120 ml  Net 1476.2 ml    LBM: Last BM Date: 05/16/20 Baseline Weight: Weight: 63.5 kg Most recent weight: Weight: 68.3 kg     Palliative Assessment/Data:     Time In: 12:00 Time Out: 1:10 Time Total: 70 min Greater than 50%  of this time was spent counseling and coordinating care related to the above assessment and plan.  Signed by: 07/17/20, NP   Please contact Palliative Medicine Team phone at [phone] for questions and concerns.  For individual provider: See [phone]

## 2020-05-16 NOTE — Consult Note (Signed)
ANTICOAGULATION CONSULT NOTE  Pharmacy Consult for Heparin infusion  Indication: cerebral venous thrombosis  Allergies  Allergen Reactions  . Sudafed [Pseudoephedrine]     History per pt of this being linked with his seizures   Patient Measurements: Height: 6\' 1"  (185.4 cm) Weight: 63.5 kg (140 lb) IBW/kg (Calculated) : 79.9  Vital Signs: Temp: 91 F (32.8 C) (07/07 0000) Temp Source: Axillary (07/07 0000) BP: 78/56 (07/07 0000) Pulse Rate: 78 (07/07 0000)  Labs: Recent Labs    05/13/20 0910 05/13/20 1736 05/14/20 0040 05/14/20 1004 05/15/20 0205 05/15/20 0205 05/15/20 0757 05/15/20 1447 05/15/20 2006 05/15/20 2200  HGB  --   --  10.1*  --  10.3*  --   --   --  9.1*  --   HCT  --   --  28.6*  --  28.8*  --   --   --  26.4*  --   PLT  --   --  185  --  191  --   --   --  126*  --   APTT 30  --   --   --   --   --   --   --   --   --   LABPROT 13.8  --  13.8  --   --   --   --   --   --   --   INR 1.1  --  1.1  --   --   --   --   --   --   --   HEPARINUNFRC  --    < > 0.20*   < > 0.32   < > 0.54 0.54  --  0.20*  CREATININE  --   --  0.66  --  0.78  --   --   --  1.31*  --    < > = values in this interval not displayed.    Estimated Creatinine Clearance: 71.4 mL/min (A) (by C-G formula based on SCr of 1.31 mg/dL (H)).   Medical History: Past Medical History:  Diagnosis Date  . Alcohol use disorder, severe, dependence (HCC)    Assessment: Pharmacy consulted for heparin infusion dosing and monitoring for 34 yo male for cerebral venous thrombosis.   CT Head: "Confirmed thrombus within the confluence of sinuses and left transverse dural venous sinus. 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 No evidence of hemorrhagic conversion."   07/05 @ 0040 HL 0.20 subtherapeutic - rate increased to 1150 units/hr  07/05 @ 1004 HL 0.70 supratherapeutic - rate decreased to 1000 units/hr 07/05 @ 1804 HL < 0.10 subtherapeutic 07/06 @ 0205 HL 0.32,  therapeutic x 1 7/6 @ 0757 0.54. Level supratherapeutic. Rate decreased to 1000 units/hr.   Goal of Therapy:  Heparin level 0.3-0.5 units/ml Monitor platelets by anticoagulation protocol: Yes   Plan:  Desired HL of 0.3-0.5 units/ml.   7/6 @ 2200 HL 0.20. SUBtherapeutic.  CBC worse.  Pt has transferred to ICU.  Will increase Heparin infusion to 1100 units/hr.  Recheck HL in 6 hours.  F/U CBC in am CBCs daily while on heparin infusion per protocol.   9/6, PharmD Clinical Pharmacist 05/16/2020 12:29 AM

## 2020-05-17 DIAGNOSIS — D72829 Elevated white blood cell count, unspecified: Secondary | ICD-10-CM

## 2020-05-17 DIAGNOSIS — G9341 Metabolic encephalopathy: Secondary | ICD-10-CM

## 2020-05-17 LAB — GASTROINTESTINAL PANEL BY PCR, STOOL (REPLACES STOOL CULTURE)

## 2020-05-17 LAB — COMPREHENSIVE METABOLIC PANEL
ALT: 20 U/L (ref 0–44)
AST: 43 U/L — ABNORMAL HIGH (ref 15–41)
Albumin: 1.8 g/dL — ABNORMAL LOW (ref 3.5–5.0)
Alkaline Phosphatase: 99 U/L (ref 38–126)
Anion gap: 14 (ref 5–15)
BUN: 24 mg/dL — ABNORMAL HIGH (ref 6–20)
CO2: 19 mmol/L — ABNORMAL LOW (ref 22–32)
Calcium: 5.5 mg/dL — CL (ref 8.9–10.3)
Chloride: 106 mmol/L (ref 98–111)
Creatinine, Ser: 1.18 mg/dL (ref 0.61–1.24)
GFR calc Af Amer: >60 mL/min (ref >60)
GFR calc non Af Amer: >60 mL/min (ref >60)
Glucose, Bld: 139 mg/dL — ABNORMAL HIGH (ref 70–99)
Potassium: 3.6 mmol/L (ref 3.5–5.1)
Sodium: 139 mmol/L (ref 135–145)
Total Bilirubin: 0.6 mg/dL (ref 0.3–1.2)
Total Protein: 4.3 g/dL — ABNORMAL LOW (ref 6.5–8.1)

## 2020-05-17 LAB — URINE CULTURE: Culture: NO GROWTH

## 2020-05-17 LAB — ECHOCARDIOGRAM COMPLETE
Height: 67 in
Weight: 2409.19 oz

## 2020-05-17 LAB — GLUCOSE, CAPILLARY
Glucose-Capillary: 79 mg/dL (ref 70–99)
Glucose-Capillary: 146 mg/dL — ABNORMAL HIGH (ref 70–99)
Glucose-Capillary: 146 mg/dL — ABNORMAL HIGH (ref 70–99)
Glucose-Capillary: 159 mg/dL — ABNORMAL HIGH (ref 70–99)

## 2020-05-17 LAB — PHOSPHORUS: Phosphorus: 4.9 mg/dL — ABNORMAL HIGH (ref 2.5–4.6)

## 2020-05-17 LAB — CBC
HCT: 22.5 % — ABNORMAL LOW (ref 39.0–52.0)
Hemoglobin: 7.8 g/dL — ABNORMAL LOW (ref 13.0–17.0)
MCH: 32.9 pg (ref 26.0–34.0)
MCHC: 34.7 g/dL (ref 30.0–36.0)
MCV: 94.9 fL (ref 80.0–100.0)
Platelets: 144 10*3/uL — ABNORMAL LOW (ref 150–400)
RBC: 2.37 MIL/uL — ABNORMAL LOW (ref 4.22–5.81)
RDW: 13.3 % (ref 11.5–15.5)
WBC: 39.8 10*3/uL — ABNORMAL HIGH (ref 4.0–10.5)
nRBC: 0.2 % (ref 0.0–0.2)

## 2020-05-17 LAB — LIPASE, BLOOD: Lipase: 337 U/L — ABNORMAL HIGH (ref 11–51)

## 2020-05-17 LAB — MAGNESIUM: Magnesium: 1.7 mg/dL (ref 1.7–2.4)

## 2020-05-17 LAB — PROCALCITONIN: Procalcitonin: 1.37 ng/mL

## 2020-05-17 LAB — HEPARIN LEVEL (UNFRACTIONATED)
Heparin Unfractionated: 0.12 IU/mL — ABNORMAL LOW (ref 0.30–0.70)
Heparin Unfractionated: 0.29 IU/mL — ABNORMAL LOW (ref 0.30–0.70)
Heparin Unfractionated: 0.32 IU/mL (ref 0.30–0.70)

## 2020-05-17 LAB — C DIFFICILE (CDIFF) QUICK SCRN (NO PCR REFLEX)
C Diff antigen: NEGATIVE
C Diff interpretation: NOT DETECTED
C Diff toxin: NEGATIVE

## 2020-05-17 MED ORDER — STERILE WATER FOR INJECTION IJ SOLN
INTRAMUSCULAR | Status: AC
  Administered 2020-05-17: 10 mL
  Filled 2020-05-17: qty 10

## 2020-05-17 MED ORDER — CALCIUM GLUCONATE-NACL 2-0.675 GM/100ML-% IV SOLN
2.0000 g | Freq: Once | INTRAVENOUS | Status: AC
  Administered 2020-05-17: 2000 mg via INTRAVENOUS
  Filled 2020-05-17: qty 100

## 2020-05-17 MED ORDER — MAGNESIUM SULFATE 2 GM/50ML IV SOLN
2.0000 g | Freq: Once | INTRAVENOUS | Status: AC
  Administered 2020-05-17: 2 g via INTRAVENOUS
  Filled 2020-05-17: qty 50

## 2020-05-17 MED ORDER — TRAVASOL 10 % IV SOLN
INTRAVENOUS | Status: AC
  Filled 2020-05-17: qty 676.8

## 2020-05-17 NOTE — Progress Notes (Signed)
PROGRESS NOTE    Shane Potvin Sr.   MRN:[id number]  DOB: 06/19/1986  PCP: Niemeyer, Meindert, MD    DOA: 05/03/2020 LOS: 14   Brief Narrative   34-year-old gentleman with no medical history, chronic alcoholism, has been drinking nonstop about 12 packs of beer daily since age of 18 presented to the ED on  05/03/2020 for evaluation of progressive confusion and lethargy.  He also had episode of unresponsiveness in the triage area.  As per patient's fianc, patient stopped drinking about 24 hours prior to arrival and started behaving altered, confused.  Also reported multiple episodes diarrhea, but no nausea or vomiting.    In the ED, he was ill-appearing, BP stable, tachycardic with heart rate 141, respirations 21, and afebrile.  Labs were notable for sodium 124, potassium was 2.8, creatinine 1.75, bilirubin 2.  Anion gap 19.  CK 700.  Chest x-ray was essentially normal.  CT head was normal.  CT abdomen pelvis was consistent with pancreatitis with peripancreatic edema and multiple lesions that are most likely pseudocysts, moderate ascites with mild fatty  infiltration of liver, small BILATERAL pleural effusions and minimal bibasilar atelectasis (other non-pertinent findings in report).   Admitted to the hospitalist service for further evaluation and management on acute alcohol withdrawal syndrome, acute pancreatitis and possible SBP.     Assessment & Plan   Principal Problem:   Acute alcoholic pancreatitis Active Problems:   Alcohol withdrawal seizure with delirium (HCC)   Hyponatremia   Hypokalemia   AKI (acute kidney injury) (HCC)   Elevated CK   Heme positive stool   Thrombocytopenia (HCC)   Acute metabolic encephalopathy   Severe protein-calorie malnutrition (HCC)   Sinus tachycardia  Septic shock with hypothermia and hypotension - now in stepdown/ICU still requiring Levophed (wean off as able), transferred overnight 7/6-7.  PCCM following.  Maintain MAP >65.  Further mgmt as  below.  Acute alcoholic pancreatitis -present on admission with abdominal pain, tachycardia, diarrhea, hypotension.  No gallbladder abnormalities on CT abdomen.  No longer complains of abdominal pain, only distention.   Worsening leukocytosis 20 >> 26 >> 40k concerning for infected necrosis.  Lipase continues to improve (912 -> 337). Abx changed to Meropenem.  Monitor abdominal exam closely.  Continue supportive care.     Severe Protein Calorie Malnutrition - POA, due to chronic alcohol abuse and not eating.  Minimal PO intake during admission.  Dietician consulted.  Family opted no tube feeding (per palliative discussion on 7/8). Dietician's recommendations have been made.  Daily CMP, Mg, Phos.  High risk of refeeding syndrome. Due to ascites, only option for enteral feeding is naso-enteric post-pyloric Dobhoff (pt likely would require restraints). Difficult situation.  Goals of care - had discussion 7/5 with wife and her mother at bedside regarding unclear prognosis at this time.  They agree to palliative consult.  Do want to pursue artificial feeding.  Otherwise continue full scope of care at this time.   7/7: Palliative care met with Fiance, Fiance's mother as well as friend Roy on 7/8. Patient is not quite ready for full comfort care and would like to treat the treatable.  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  Neurology following.  Heparin infusion.  Closely monitor for bleeding, CBC's.  Neuro-checks.  Repeat head CT 7/5 ruled out hemorrhage or hydrocephalus.  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  ASA and statin started.  Neurology following.  Repeat CT 7/5 as above.  Generalized weakness - severe.  Due to chronic alcoholism and malnutrition as  well as prolonged acute illness.  He is requiring heavy assistance with bed mobility and standing.   Initial PT recommendation is SNF.  TOC following.  If he survives this admission, expect CIR will be needed.  Acute metabolic encephalopathy - waxes and wanes, having hallucinations, agitation at times.  Lethargy has resolved.  No longer receiving benzos for alcohol withdrawal.  CT head upon admission was negative for acute findings that showed generalized atrophy advanced for his age.  Exam is non-focal.  Ammonia level normal x 2.  B12 normal.  UDS on admission was never done, please collect and process.  Neurology consulted, appreciate input.  EEG consistent with encephalopathy, no epileptiform activity.   Mild TSH elevation but normal T4.  Repeat head CT is neg   Hematuria - new 7/3, in addition to pt reporting dysuria.  UA negative.  Appears resolved at this time.  Diarrhea - present on admission, persistent.  No fevers and leukocytosis likely due to pancreatitis, very unlikely infectious. Suspect malabsorption due to pancreatitis.  FMS in place.  Leukocytosis - worsening.  Likely due to pancreatitis, no other evidence of infection.  UA negative.  No respiratory symptoms.  Monitor closely.  On empiric Zosyn (started 7/4) given persistent high wbc, concern for developing pancreatic pseudocyst infection or SBP switched to meropenem on 7/7.  Follow blood cultures, no growth so far. C/s ID for Abx mgmt and need for c.diff. GI panel neg  Sinus tachycardia - resolved after starting empiric antibiotics.  Had been persistently elevated HR despite being past alcohol withdrawal and pain better from pancreatitis.  Ascites fluid cultures negative to date. Blood cultures negative to date.  No respiratory or urinary symptoms.  7/6 - worsening leukocytosis, concern for infected necrosis on pancreas.  On Meropenem. ID c/s  Ascites - his liver has mild fatty infiltration on imaging, not cirrhotic at this point,  this is likely due to pancreatitis and hypoalbuminemia.  Underwent paracentesis on 6/28 with 2.8 L fluid removed. Ordered repeat paracentesis (7/1) due to increased distention, but not enough fluid seen on ultrasound to do safely.  Large volume ascites seen on MRI abdomen (7/3), Continue Lasix and spironolactone.  Repeat paracentesis on 7/7 with 3 L of dark serous fluid removed.  Continue empiric meropenem.  Elevated TSH - free T4 normal.    Chest pain - Resolved.  Reported by pt on AM of 7/1.  Troponin mildly elevated at 32, likely demand ischemia from persistent tachycardia in setting of alcohol withdrawal.  No acute ischemic changes on EKG, only non-specific T wave changes.  Monitor.  Hypervolemic hyponatremia -POA with sodium 124.  Resolved.  Suspect due to liver disease.  Monitor BMP.  Improving with diuretics.    Acute renal failure -Resolved.  Present on admission with creatinine 1.75.  Likely prerenal azotemia in the setting of above.  Improved with IV hydration.  Monitor BMP and urine output.  Hypokalemia -present on admission, replaced.  Monitor BMP, Mg, Phos and replace as needed.    Patient BMI: Body mass index is 23.58 kg/m.   DVT prophylaxis: SCDs Start: 05/03/20 2153   Diet:  Diet Orders (From admission, onward)    Start     Ordered   05/15/20 1035    Diet regular Room service appropriate? Yes; Fluid consistency: Thin  Diet effective now       Comments: Please give 3 milk cartons with each meal.  Question Answer Comment  Room service appropriate? Yes   Fluid consistency: Thin      05/15/20 1035            Code Status: DNR    Subjective 05/17/20    Patient seen this AM, more alert today     Disposition Plan & Communication   Status is: Inpatient  Remains inpatient appropriate because:Inpatient level of care appropriate due to severity of illness   Dispo: The patient is from: Home              Anticipated d/c is to: SNF              Anticipated d/c  date is: > 3 days                Patient currently is not medically stable to d/c.   Family Communication: wife at bedside during encounter    Consults, Procedures, Significant Events   Consultants:   Neurology  Gastroenterology  General surgery  Palliative care  PCCM   Procedures:   Paracentesis 05/07/2020, 05/16/2020  Antimicrobials:   Meropenem (6/28 to 7/1)  Zosyn 7/4 >>    Objective   Vitals:   05/17/20 1700 05/17/20 1747 05/17/20 1800 05/17/20 1900  BP: (!) 104/56 (!) 93/55 (!) 92/55 (!) 101/54  Pulse: 93 94 96 96  Resp: (!) 23 (!) 21 (!) 25 (!) 22  Temp: 98.2 F (36.8 C) (!) 97.5 F (36.4 C) (!) 97.3 F (36.3 C) 97.7 F (36.5 C)  TempSrc:      SpO2: 97% 98% 99% 98%  Weight:      Height:        Intake/Output Summary (Last 24 hours) at 05/17/2020 2032 Last data filed at 05/17/2020 1945 Gross per 24 hour  Intake 2068.24 ml  Output 2735 ml  Net -666.76 ml   Filed Weights   05/03/20 2232 05/16/20 0030  Weight: 63.5 kg 68.3 kg    Physical Exam:  General exam: awake, alert, no acute distress, underweight, chronically ill appearing HEENT: very dry mucus membranes, hearing grossly normal, pale lips  Respiratory system: shallow inspirations, CTAB, normal respiratory effort, no wheezes or rhonchi. Cardiovascular system: normal S1/S2, RRR, 2+ LE edema worse in the feet.   Gastrointestinal system: tensely distended but not tender, active bowel sounds, FMS in place with light brown liquid stool in bag. Central nervous system: Alert, oriented x 3, very soft vocal projection. No gross focal neurologic deficits.   Extremities: edematous feet, no cyanosis, moves all extremities Skin: dry, intact, normal temperature, pale Psychiatric: normal mood with periods of anxiety, congruent affect  Labs   Data Reviewed: I have personally reviewed following labs and imaging studies  CBC: Recent Labs  Lab 05/11/20 0603 05/11/20 0603 05/12/20 0557 05/12/20 0557  05/13/20 0618 05/13/20 0618 05/14/20 0040 05/14/20 0040 05/15/20 0205 05/15/20 2006 05/16/20 0331 05/16/20 1230 05/17/20 0535  WBC 19.5*   < > 19.4*   < > 20.4*   < > 20.3*  --  26.3* 34.2* 40.4*  --  39.8*  NEUTROABS 15.9*  --  16.4*  --  17.5*  --   --   --   --  31.7*  --   --   --   HGB 11.1*   < > 10.4*   < > 10.5*   < >   10.1*   < > 10.3* 9.1* 7.9* 8.1* 7.8*  HCT 31.2*   < > 29.5*   < > 30.3*   < > 28.6*   < > 28.8* 26.4* 22.6* 23.8* 22.5*  MCV 92.0   < > 92.5   < > 94.4   < > 93.2  --  90.6 95.0 95.4  --  94.9  PLT 219   < > 239   < > 246   < > 185  --  191 126* 151  --  144*   < > = values in this interval not displayed.   Basic Metabolic Panel: Recent Labs  Lab 05/12/20 0557 05/12/20 0557 05/13/20 0618 05/13/20 0618 05/14/20 0040 05/15/20 0205 05/15/20 2006 05/16/20 0331 05/17/20 0535  NA 134*   < > 135   < > 135 136 137 137 139  K 3.1*   < > 3.8   < > 3.4* 3.8 3.6 3.9 3.6  CL 97*   < > 101   < > 99 103 103 102 106  CO2 27   < > 26   < > 28 24 25 22 19*  GLUCOSE 111*   < > 113*   < > 141* 132* 147* 126* 139*  BUN 14   < > 16   < > 17 20 26* 29* 24*  CREATININE 0.65   < > 0.56*   < > 0.66 0.78 1.31* 1.42* 1.18  CALCIUM 7.8*   < > 7.9*   < > 7.3* 6.1* 5.2* 5.3* 5.5*  MG 1.6*   < > 1.7  --  2.0 1.8  --  1.9 1.7  PHOS 4.3  --  4.3  --   --  3.7  --  4.7* 4.9*   < > = values in this interval not displayed.   GFR: Estimated Creatinine Clearance: 82.5 mL/min (by C-G formula based on SCr of 1.18 mg/dL). Liver Function Tests: Recent Labs  Lab 05/14/20 0040 05/15/20 0205 05/15/20 2006 05/16/20 0331 05/17/20 0535  AST 34 39 39 36 43*  ALT 24 25 24 21 20  ALKPHOS 87 81 79 71 99  BILITOT 0.7 0.7 0.7 0.8 0.6  PROT 4.4* 4.4* 4.1* 4.9* 4.3*  ALBUMIN 1.6* 1.5* 1.4* 2.8* 1.8*   Recent Labs  Lab 05/13/20 0618 05/14/20 0040 05/15/20 0205 05/16/20 0331 05/17/20 0535  LIPASE 1,543* 1,510* 1,141* 912* 337*   Recent Labs  Lab 05/12/20 1117 05/15/20 2006   AMMONIA <9* 17   Coagulation Profile: Recent Labs  Lab 05/13/20 0910 05/14/20 0040  INR 1.1 1.1   Cardiac Enzymes: No results for input(s): CKTOTAL, CKMB, CKMBINDEX, TROPONINI in the last 168 hours. BNP (last 3 results) No results for input(s): PROBNP in the last 8760 hours. HbA1C: No results for input(s): HGBA1C in the last 72 hours. CBG: Recent Labs  Lab 05/16/20 2339 05/17/20 0401 05/17/20 0712 05/17/20 1141 05/17/20 1609  GLUCAP 187* 79 146* 146* 159*   Lipid Profile: Recent Labs    05/16/20 1230  TRIG 45   Thyroid Function Tests: Recent Labs    05/15/20 2029  TSH 11.634*   Anemia Panel: No results for input(s): VITAMINB12, FOLATE, FERRITIN, TIBC, IRON, RETICCTPCT in the last 72 hours. Sepsis Labs: Recent Labs  Lab 05/15/20 2223 05/16/20 0327 05/17/20 0535  PROCALCITON  --  1.84 1.37  LATICACIDVEN 2.0* 1.1  --     Recent Results (from the past 240 hour(s))  Culture, blood (single) w Reflex to   ID Panel     Status: None   Collection Time: 05/11/20  7:59 AM   Specimen: BLOOD  Result Value Ref Range Status   Specimen Description BLOOD LEFT WRIST  Final   Special Requests   Final    BOTTLES DRAWN AEROBIC AND ANAEROBIC Blood Culture adequate volume   Culture   Final    NO GROWTH 5 DAYS Performed at [facility], [address] [place], [place] [address]    Report Status 05/16/2020 FINAL  Final  CULTURE, BLOOD (ROUTINE X 2) w Reflex to ID Panel     Status: None (Preliminary result)   Collection Time: 05/15/20 11:52 PM   Specimen: Left Antecubital; Blood  Result Value Ref Range Status   Specimen Description LEFT ANTECUBITAL  Final   Special Requests   Final    BOTTLES DRAWN AEROBIC AND ANAEROBIC Blood Culture adequate volume   Culture   Final    NO GROWTH 1 DAY Performed at [facility], [address] [place], [place] [address]    Report Status PENDING  Incomplete  CULTURE, BLOOD (ROUTINE X 2) w Reflex to ID Panel      Status: None (Preliminary result)   Collection Time: 05/15/20 11:53 PM   Specimen: BLOOD LEFT HAND  Result Value Ref Range Status   Specimen Description BLOOD LEFT HAND  Final   Special Requests   Final    BOTTLES DRAWN AEROBIC AND ANAEROBIC Blood Culture adequate volume   Culture   Final    NO GROWTH 1 DAY Performed at [facility], [address], [place], [place] [address]    Report Status PENDING  Incomplete  MRSA PCR Screening     Status: None   Collection Time: 05/16/20 12:24 AM   Specimen: Nasopharyngeal  Result Value Ref Range Status   MRSA by PCR NEGATIVE NEGATIVE Final    Comment:        The GeneXpert MRSA Assay (FDA approved for NASAL specimens only), is one component of a comprehensive MRSA colonization surveillance program. It is not intended to diagnose MRSA infection nor to guide or monitor treatment for MRSA infections. Performed at [facility], [address] [place], [place] [address]   Urine Culture     Status: None   Collection Time: 05/16/20  3:27 AM   Specimen: Urine, Random  Result Value Ref Range Status   Specimen Description   Final    URINE, RANDOM Performed at [facility], [address] [place], [place] [address]    Special Requests   Final    NONE Performed at [facility], [address] [place], [place] [address]    Culture   Final    NO GROWTH Performed at [facility], [place] [address] [place], [place] [address]    Report Status 05/17/2020 FINAL  Final  Gastrointestinal Panel by PCR , Stool     Status: None   Collection Time: 05/17/20  3:06 PM   Specimen: Stool  Result Value Ref Range Status   Campylobacter species NOT DETECTED NOT DETECTED Final   Plesimonas shigelloides NOT DETECTED NOT DETECTED Final   Salmonella species NOT DETECTED NOT DETECTED Final   Yersinia enterocolitica NOT DETECTED NOT DETECTED Final   Vibrio species NOT DETECTED NOT DETECTED Final   Vibrio cholerae  NOT DETECTED NOT DETECTED Final   Enteroaggregative E coli (EAEC) NOT DETECTED NOT DETECTED Final   Enteropathogenic E coli (EPEC) NOT DETECTED NOT DETECTED Final  Enterotoxigenic E coli (ETEC) NOT DETECTED NOT DETECTED Final   Shiga like toxin producing E coli (STEC) NOT DETECTED NOT DETECTED Final   Shigella/Enteroinvasive E coli (EIEC) NOT DETECTED NOT DETECTED Final   Cryptosporidium NOT DETECTED NOT DETECTED Final   Cyclospora cayetanensis NOT DETECTED NOT DETECTED Final   Entamoeba histolytica NOT DETECTED NOT DETECTED Final   Giardia lamblia NOT DETECTED NOT DETECTED Final   Adenovirus F40/41 NOT DETECTED NOT DETECTED Final   Astrovirus NOT DETECTED NOT DETECTED Final   Norovirus GI/GII NOT DETECTED NOT DETECTED Final   Rotavirus A NOT DETECTED NOT DETECTED Final   Sapovirus (I, II, IV, and V) NOT DETECTED NOT DETECTED Final    Comment: Performed at [facility], [address] [place], [place] [address]      Imaging Studies   CT ABDOMEN PELVIS WO CONTRAST  Result Date: 05/16/2020 CLINICAL DATA:  Acute, severe, pancreatitis EXAM: CT ABDOMEN AND PELVIS WITHOUT CONTRAST TECHNIQUE: Multidetector CT imaging of the abdomen and pelvis was performed following the standard protocol without IV contrast. COMPARISON:  MRCP 05/12/2020 FINDINGS: Lower chest: Moderate left and small right pleural effusions with lower lobe atelectasis. Low-density blood pool suggesting anemia. Hepatobiliary: No focal liver abnormality.No evidence of biliary obstruction or stone. Pancreas: Peripancreatic edema may be improved. 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 The dominant pseudocyst at the uncinate process has decreased to 2.2 cm. A cyst in the midline pancreas is mildly increased to 15 mm. Spleen: Unremarkable. Adrenals/Urinary Tract: Negative adrenals. No hydronephrosis or stone. Collapsed bladder by Foley catheter Stomach/Bowel: Rectal tube in place. No bowel obstruction or definite inflammation.  Vascular/Lymphatic: No acute vascular abnormality. No mass or adenopathy. Reproductive:No pathologic findings. Other: Ascites with areas of loculation around the liver, right gutter, and pelvis. The volume is not changed from comparison MRCP. The thickest pocket met measures 4 cm over the liver and 7 cm inferior to the liver. Reticulation of the omentum which is likely edema by prior MRI. Anasarca. Musculoskeletal: Healing lateral left sixth and seventh rib fractures. IMPRESSION: 1. Multiple pseudocysts. Compared to 05/12/2020, the largest pseudocyst in the uncinate process has decreased in size to 2.2 cm. A midline pseudocyst has mildly increased from prior. No acute inflammation is currently seen about the pancreas. 2. Large volume ascites with a degree of loculation along the liver, right gutter, and pelvis. 3. Left more than right pleural effusion with atelectasis, not progressed. Electronically Signed   By: Jonathon  Watts M.D.   On: 05/16/2020 04:35   MR BRAIN WO CONTRAST  Result Date: 05/15/2020 CLINICAL DATA:  Dural venous sinus thrombosis EXAM: MRI HEAD WITHOUT CONTRAST TECHNIQUE: Multiplanar, multiecho pulse sequences of the brain and surrounding structures were obtained without intravenous contrast. COMPARISON:  Brain MRI 05/12/2020 FINDINGS: Brain: There are areas of residual abnormal diffusion restriction over the right posterior parietal lobe and right cerebellum. No new diffusion abnormality. Normal white matter signal. Normal volume of CSF spaces. No chronic microhemorrhage. Vascular: Normal arterial flow voids. Skull and upper cervical spine: Normal marrow signal Sinuses/Orbits: Negative. Other: None. IMPRESSION: Residual abnormal diffusion restriction over the right posterior parietal lobe and right cerebellum, consistent with expected evolution of subacute venous infarcts. No new diffusion abnormality. Electronically Signed   By: Kevin  Herman M.D.   On: 05/15/2020 22:55   US  Paracentesis  Result Date: 05/16/2020 INDICATION: History of alcoholic cirrhosis, now with symptomatic ascites. Please perform ultrasound-guided paracentesis for therapeutic purposes. EXAM: ULTRASOUND-GUIDED PARACENTESIS COMPARISON:  CT abdomen pelvis-05/16/2020; ultrasound-guided paracentesis performed   05/07/2020 (yielding 2.8 L of peritoneal fluid) MEDICATIONS: None. COMPLICATIONS: None immediate. TECHNIQUE: Informed written consent was obtained from the patient after a discussion of the risks, benefits and alternatives to treatment. A timeout was performed prior to the initiation of the procedure. Initial ultrasound scanning demonstrates a moderate amount of ascites within the right lower abdomen which was subsequently prepped and draped in the usual sterile fashion. 1% lidocaine with epinephrine was used for local anesthesia. Under direct ultrasound guidance, a 19 gauge, 7-cm, Yueh catheter was introduced. An ultrasound image was saved for documentation purposed. The paracentesis was performed. The catheter was removed and a dressing was applied. The patient tolerated the procedure well without immediate post procedural complication. FINDINGS: A total of approximately 3 liters of dark serous ascitic fluid was removed. IMPRESSION: Successful ultrasound-guided paracentesis yielding 3 liters of peritoneal fluid. Electronically Signed   By: John  Watts M.D.   On: 05/16/2020 15:55   ECHOCARDIOGRAM COMPLETE  Result Date: 05/17/2020    ECHOCARDIOGRAM REPORT   Patient Name:   Shane Hausler Sr. Date of Exam: 05/16/2020 Medical Rec #:  [id number]       Height:       67.0 in Accession #:    [id number]      Weight:       150.6 lb Date of Birth:  07/30/1986        BSA:          1.792 m Patient Age:    34 years        BP:           94/63 mmHg Patient Gender: M               HR:           91 bpm. Exam Location:  [facility] Procedure: 2D Echo, Cardiac Doppler and Color Doppler Indications:     Shock  History:         Patient has no  prior history of Echocardiogram examinations.                  Risk Factors:Alochol Abuse.  Sonographer:     NaTashia Rodgers-Chatterjee Referring Phys:  [id number] JEREMIAH D KEENE Diagnosing Phys: Timothy Gollan MD IMPRESSIONS  1. Left ventricular ejection fraction, by estimation, is 60 to 65%. The left ventricle has normal function. The left ventricle has no regional wall motion abnormalities. Left ventricular diastolic parameters were normal.  2. Right ventricular systolic function is normal. The right ventricular size is normal.  3. The mitral valve is normal in structure. No evidence of mitral valve regurgitation. No evidence of mitral stenosis.  4. The aortic valve is normal in structure. Aortic valve regurgitation is not visualized. No aortic stenosis is present.  5. The inferior vena cava is normal in size with greater than 50% respiratory variability, suggesting right atrial pressure of 3 mmHg. FINDINGS  Left Ventricle: Left ventricular ejection fraction, by estimation, is 60 to 65%. The left ventricle has normal function. The left ventricle has no regional wall motion abnormalities. The left ventricular internal cavity size was normal in size. There is  no left ventricular hypertrophy. Left ventricular diastolic parameters were normal. Right Ventricle: The right ventricular size is normal. No increase in right ventricular wall thickness. Right ventricular systolic function is normal. Left Atrium: Left atrial size was normal in size. Right Atrium: Right atrial size was normal in size. Pericardium: There is no evidence of pericardial effusion. Mitral Valve: The mitral valve is normal   in structure. Normal mobility of the mitral valve leaflets. No evidence of mitral valve regurgitation. No evidence of mitral valve stenosis. Tricuspid Valve: The tricuspid valve is normal in structure. Tricuspid valve regurgitation is not demonstrated. No evidence of tricuspid stenosis. Aortic Valve: The aortic valve is normal in  structure. Aortic valve regurgitation is not visualized. No aortic stenosis is present. Pulmonic Valve: The pulmonic valve was normal in structure. Pulmonic valve regurgitation is not visualized. No evidence of pulmonic stenosis. Aorta: The aortic root is normal in size and structure. Venous: The inferior vena cava is normal in size with greater than 50% respiratory variability, suggesting right atrial pressure of 3 mmHg. IAS/Shunts: No atrial level shunt detected by color flow Doppler.  LEFT VENTRICLE PLAX 2D LVIDd:         4.34 cm  Diastology LVIDs:         2.77 cm  LV e' lateral:   10.30 cm/s LV PW:         0.97 cm  LV E/e' lateral: 10.0 LV IVS:        0.87 cm  LV e' medial:    9.79 cm/s LVOT diam:     2.10 cm  LV E/e' medial:  10.5 LV SV:         59 LV SV Index:   33 LVOT Area:     3.46 cm  RIGHT VENTRICLE             IVC RV Basal diam:  3.44 cm     IVC diam: 1.92 cm RV S prime:     16.90 cm/s TAPSE (M-mode): 2.5 cm LEFT ATRIUM             Index       RIGHT ATRIUM          Index LA diam:        3.60 cm 2.01 cm/m  RA Area:     9.62 cm LA Vol (A2C):   40.9 ml 22.82 ml/m RA Volume:   21.10 ml 11.77 ml/m LA Vol (A4C):   29.0 ml 16.18 ml/m LA Biplane Vol: 34.8 ml 19.42 ml/m  AORTIC VALVE LVOT Vmax:   88.60 cm/s LVOT Vmean:  61.900 cm/s LVOT VTI:    0.169 m  AORTA Ao Root diam: 3.50 cm MITRAL VALVE MV Area (PHT): 2.39 cm     SHUNTS MV Decel Time: 317 msec     Systemic VTI:  0.17 m MV E velocity: 103.00 cm/s  Systemic Diam: 2.10 cm MV A velocity: 107.00 cm/s MV E/A ratio:  0.96 Ida Rogue MD Electronically signed by Ida Rogue MD Signature Date/Time: 05/17/2020/7:45:35 AM    Final      Medications   Scheduled Meds: . Chlorhexidine Gluconate Cloth  6 each Topical Daily  . Gerhardt's butt cream   Topical TID  . insulin aspart  0-9 Units Subcutaneous Q6H  . pantoprazole (PROTONIX) IV  40 mg Intravenous Q12H  . sodium chloride flush  3 mL Intravenous Q12H   Continuous Infusions: . sodium  chloride Stopped (05/13/20 1628)  . heparin 1,350 Units/hr (05/17/20 1758)  . meropenem (MERREM) IV 1 g (05/17/20 1616)  . norepinephrine (LEVOPHED) Adult infusion 10 mcg/min (05/17/20 0600)  . norepinephrine (LEVOPHED) Adult infusion Stopped (05/16/20 0503)  . TPN ADULT (ION) 60 mL/hr at 05/17/20 1750       LOS: 14 days    Time spent: 20 minutes with > 50% spent in coordination of care and direct patient contact.  Joanne Salah Manuella Ghazi, DO [facility]  05/17/2020, 8:32 PM    If 7PM-7AM, please contact night-coverage. How to contact the [facility] Attending or Consulting provider [place] or covering provider during after hours [place], for this patient?    1. Check the care team in [facility] and look for a) attending/consulting TR[facility] provider listed and b) the [facility] team listed 2. Log into [email or web address] and use [place]'s universal password to access. If you do not have the password, please contact the hospital operator. 3. Locate the [facility] provider you are looking for under [facility] and page to a number that you can be directly reached. 4. If you still have difficulty reaching the provider, please page the [facility] (Director on Call) for the Hospitalists listed on amion for assistance.

## 2020-05-17 NOTE — Progress Notes (Signed)
This patient is more awake and alert today but still slurring his words.  The patient has had an increase in his white cell count and continues to have diarrhea.  I have ordered a GI panel to look for any cause of his diarrhea.  It may be caused by pancreatic insufficiency due to his pancreatitis and long standing alcohol abuse. Although the patient appears better today he still has a significant amount of medical problems and a guarded to poor prognosis.  Nothing further to do from a GI point of view at this time.

## 2020-05-17 NOTE — Consult Note (Signed)
NAME: Shane Speagle Sr.  DOB: 01-Jan-1986  MRN: [id number]  Date/Time: 05/17/2020 9:51 PM  REQUESTING PROVIDER: Sherryll Burger Subjective:  REASON FOR CONSULT: leucocytosis ?Complicated history.  Patient is a poor historian.  Chart reviewed. Some information from nurse fianc. Patient is in the hospital since 05/03/2020. Shane Mccahill Sr. is a 34 y.o. male with a history of chronic alcoholism who has been drinking 12 packs of beer daily since the age of 61 presented to the ED on 05/03/2020 for progressive confusion and lethargic.  He also had episodes of unresponsiveness in the triage area as per patient's fianc he stopped drinking about 24 hours prior to arrival and started behaving altered and confused.  Also patient has had multiple episodes of diarrhea few days before presentation. In the ED he was not ill-appearing male emaciated, heart rate of 141, respiratory 21 was afebrile.  Labs were noted for sodium of 124 and potassium of 3.8 creatinine of 1.7, bilirubin of 2 and an anion gap of 19 and CK of 700.  Chest x-ray was essentially normal.  CT head was normal.  CT abdomen and pelvis was consistent with pancreatitis with peripancreatic edema and multiple lesions that were most likely pseudocyst and moderate ascites with mild fatty infiltration of the liver and small bilateral pleural effusion. Patient was admitted to the hospitalist service and was managed as acute alcohol withdrawal syndrome and acute pancreatitis and possible SBP.  He was evaluated by surgeon on 05/06/2020 for acute compartment syndrome and Dr. Christa See assessed the patient and found thatIndirect intra-abdominal pressure measuring blood pressure was 20 mmHg.  The patient was then without any respiratory distress and had adequate renal function good blood pressure measurements.  He did not think the patient had abdominal compartment syndrome.  He asked that the Foley be kept in place for close monitoring of urine output.  He also recommended that this patient  be transferred to a tertiary institution because of worsening pancreatitis.  On 05/07/2020 acetic fluid was tapped and the culture was negative.  He had a cell count of 4000 822 with neutrophils 79%.  The lipase in the fluid was 53,000 760.  LDH was 526.  Amylase was more than 10,000. On 05/10/2020 neurology was consulted for altered mental status.  Recommended MRI of the brain without contrast, TSH, thiamine level and EEG.  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  Patient did not receive any coronavirus [employer] vaccine.  He was initiated on heparin.  EEG showed generalized background slowing.  No epileptiform activity was noted.  On 7/4  patient was started on empiric Zosyn to empirically cover for infection.    On 7 3 an MRI of the abdomen showed fluid signal lesions within the pancreatic head and neck the largest in the pancreatic head adjacent to the pancreatic duct measuring 3.8 cm.  And there was large volume ascites throughout the abdomen. A repeat MRI brain without contrast done on 05/15/2020 showed Residual abnormal diffusion restriction over the right posterior parietal lobe and right cerebellum, consistent with expected evolution of subacute venous infarcts On 05/16/2019 1 in the evening he became hypotensive and hypothermic with worsening leukocytosis and development of lactic acidosis.  He was transferred to ICU on 05/16/2020 due to the concern for developing septic shock.  He was started on pressors.  His lactate was 2 1 pro-Cal was 1.84 WBC was 40.4 and hemoglobin was 11.9.  CT abdomen done  on 05/16/2020 showed peripancreatic edema with multiple pseudocyst.  The dominant pseudocyst has decreased to 2.2 cm.  There was extensive ascites with loculation around the liver right gutter and pelvis. Patient has been started on meropenem.  As he continues to have high white count and diarrhea I am asked to see the patient and  to rule out also C. difficile infection.  Past Medical History:  Diagnosis Date  . Alcohol use disorder, severe, dependence (HCC)     History reviewed. No pertinent surgical history.  Social History   Socioeconomic History  . Marital status: Divorced    Spouse name: Not on file  . Number of children: Not on file  . Years of education: Not on file  . Highest education level: Not on file  Occupational History  . Not on file  Tobacco Use  . Smoking status: Current Every Day Smoker  . Smokeless tobacco: Never Used  Substance and Sexual Activity  . Alcohol use: Yes    Comment: 24 beers/day since age 1.  . Drug use: Not Currently  . Sexual activity: Not on file  Other Topics Concern  . Not on file  Social History Narrative  . Not on file   Social Determinants of Health   Financial Resource Strain:   . Difficulty of Paying Living Expenses:   Food Insecurity:   . Worried About Programme researcher, broadcasting/film/video in the Last Year:   . Barista in the Last Year:   Transportation Needs:   . Freight forwarder (Medical):   Marland Kitchen Lack of Transportation (Non-Medical):   Physical Activity:   . Days of Exercise per Week:   . Minutes of Exercise per Session:   Stress:   . Feeling of Stress :   Social Connections:   . Frequency of Communication with Friends and Family:   . Frequency of Social Gatherings with Friends and Family:   . Attends Religious Services:   . Active Member of Clubs or Organizations:   . Attends Banker Meetings:   Marland Kitchen Marital Status:   Intimate Partner Violence:   . Fear of Current or Ex-Partner:   . Emotionally Abused:   Marland Kitchen Physically Abused:   . Sexually Abused:     Family History  Problem Relation Age of Onset  . Alcohol abuse Mother    Allergies  Allergen Reactions  . Sudafed [Pseudoephedrine]     History per pt of this being linked with his seizures  ? Current Facility-Administered Medications  Medication Dose Route Frequency Provider Last  Rate Last Admin  . 0.9 %  sodium chloride infusion   Intravenous PRN Esaw Grandchild A, DO   Stopped at 05/13/20 1628  . Chlorhexidine Gluconate Cloth 2 % PADS 6 each  6 each Topical Daily Esaw Grandchild A, DO   6 each at 05/17/20 0013  . Gerhardt's butt cream   Topical TID Esaw Grandchild A, DO   Given at 05/17/20 1746  . heparin ADULT infusion 100 units/mL (25000 units/217mL sodium chloride 0.45%)  1,350 Units/hr Intravenous Continuous Ronnald Ramp, RPH 13.5 mL/hr at 05/17/20 1758 1,350 Units/hr at 05/17/20 1758  . hydrALAZINE (APRESOLINE) tablet 25 mg  25 mg Oral Q6H PRN Esaw Grandchild A, DO      . HYDROmorphone (DILAUDID) injection 0.5 mg  0.5 mg Intravenous Q2H PRN Dorcas Carrow, MD   0.5 mg at 05/17/20 1747  . insulin aspart (novoLOG) injection 0-9 Units  0-9 Units Subcutaneous Q6H Lowella Bandy,  RPH   2 Units at 05/17/20 1746  . labetalol (NORMODYNE) injection 10 mg  10 mg Intravenous Q4H PRN Esaw Grandchild A, DO   10 mg at 05/15/20 1909  . loperamide (IMODIUM) capsule 2 mg  2 mg Oral PRN Esaw Grandchild A, DO   2 mg at 05/17/20 0355  . LORazepam (ATIVAN) injection 0.5 mg  0.5 mg Intravenous Q6H PRN Esaw Grandchild A, DO   0.5 mg at 05/14/20 0900  . meropenem (MERREM) 1 g in sodium chloride 0.9 % 100 mL IVPB  1 g Intravenous Q8H Hall, Scott A, RPH 200 mL/hr at 05/17/20 1616 1 g at 05/17/20 1616  . norepinephrine (LEVOPHED) 16 mg in premix infusion  0-40 mcg/min Intravenous Titrated Harlon Ditty D, NP 9.38 mL/hr at 05/17/20 0600 10 mcg/min at 05/17/20 0600  . norepinephrine (LEVOPHED) 4mg  in premix infusion  0-40 mcg/min Intravenous Titrated , NP   Stopped at 05/16/20 0503  . ondansetron (ZOFRAN) tablet 4 mg  4 mg Oral Q6H PRN 07/17/20, MD       Or  . ondansetron (ZOFRAN) injection 4 mg  4 mg Intravenous Q6H PRN Charlsie Quest, MD   4 mg at 05/06/20 1456  . pantoprazole (PROTONIX) injection 40 mg  40 mg Intravenous Q12H 05/08/20, RPH   40  mg at 05/17/20 2134  . sodium chloride flush (NS) 0.9 % injection 3 mL  3 mL Intravenous Q12H 2135, MD   3 mL at 05/17/20 2137  . TPN ADULT (ION)   Intravenous Continuous TPN 2138, RPH 60 mL/hr at 05/17/20 1750 New Bag at 05/17/20 1750     Abtx:  Anti-infectives (From admission, onward)   Start     Dose/Rate Route Frequency Ordered Stop   05/16/20 0030  meropenem (MERREM) 1 g in sodium chloride 0.9 % 100 mL IVPB     Discontinue     1 g 200 mL/hr over 30 Minutes Intravenous Every 8 hours 05/15/20 2332     05/13/20 1000  piperacillin-tazobactam (ZOSYN) IVPB 3.375 g  Status:  Discontinued        3.375 g 12.5 mL/hr over 240 Minutes Intravenous Every 8 hours 05/13/20 0848 05/16/20 0610   05/07/20 1415  meropenem (MERREM) 1 g in sodium chloride 0.9 % 100 mL IVPB  Status:  Discontinued        1 g 200 mL/hr over 30 Minutes Intravenous Every 8 hours 05/07/20 1403 05/10/20 0812      REVIEW OF SYSTEMS:  Patient is very weak to give any review of system. Fianc at bedside says today is his better day. Objective:  VITALS:  BP (!) 101/54   Pulse 96   Temp 97.7 F (36.5 C)   Resp (!) 22   Ht 5\' 7"  (1.702 m)   Wt 68.3 kg   SpO2 98%   BMI 23.58 kg/m  PHYSICAL EXAM:  General: Awake chronically ill, emaciated,  head: Normocephalic, without obvious abnormality, atraumatic. Eyes: Conjunctivae clear, icteric sclerae. Pupils are equal ENT Nares normal. No drainage or sinus tenderness. Lips, mucosa, and tongue normal. No Thrush Neck: Supple,  Lungs: Bilateral air entry decreased in the bases Heart: Tachycardia Abdomen: Soft, Foley present Left PICC line Extremities: Erythematous macules legs        lymph: Cervical, supraclavicular normal. Neurologic: Grossly non-focal Pertinent Labs Lab Results CBC      Component Value Date/Time   WBC 39.8 (H) 05/17/2020 0535  RBC 2.37 (L) 05/17/2020 0535   HGB 7.8 (L) 05/17/2020 0535   HCT 22.5 (L) 05/17/2020 0535    PLT 144 (L) 05/17/2020 0535   MCV 94.9 05/17/2020 0535   MCH 32.9 05/17/2020 0535   MCHC 34.7 05/17/2020 0535   RDW 13.3 05/17/2020 0535   LYMPHSABS 1.0 05/15/2020 2006   MONOABS 0.7 05/15/2020 2006   EOSABS 0.1 05/15/2020 2006   BASOSABS 0.1 05/15/2020 2006    CMP Latest Ref Rng & Units 05/17/2020 05/16/2020 05/15/2020  Glucose 70 - 99 mg/dL 948(A) 165(V) 374(M)  BUN 6 - 20 mg/dL 27(M) 78(M) 75(Q)  Creatinine 0.61 - 1.24 mg/dL 4.92 0.10(O) 7.12(R)  Sodium 135 - 145 mmol/L 139 137 137  Potassium 3.5 - 5.1 mmol/L 3.6 3.9 3.6  Chloride 98 - 111 mmol/L 106 102 103  CO2 22 - 32 mmol/L 19(L) 22 25  Calcium 8.9 - 10.3 mg/dL 9.7(JO) 5.3(LL) 5.2(LL)  Total Protein 6.5 - 8.1 g/dL 4.3(L) 4.9(L) 4.1(L)  Total Bilirubin 0.3 - 1.2 mg/dL 0.6 0.8 0.7  Alkaline Phos 38 - 126 U/L 99 71 79  AST 15 - 41 U/L 43(H) 36 39  ALT 0 - 44 U/L 20 21 24       Microbiology: Recent Results (from the past 240 hour(s))  Culture, blood (single) w Reflex to ID Panel     Status: None   Collection Time: 05/11/20  7:59 AM   Specimen: BLOOD  Result Value Ref Range Status   Specimen Description BLOOD LEFT WRIST  Final   Special Requests   Final    BOTTLES DRAWN AEROBIC AND ANAEROBIC Blood Culture adequate volume   Culture   Final    NO GROWTH 5 DAYS Performed at [facility], [address] [place], [place]    Report Status 05/16/2020 FINAL  Final  CULTURE, BLOOD (ROUTINE X 2) w Reflex to ID Panel     Status: None (Preliminary result)   Collection Time: 05/15/20 11:52 PM   Specimen: Left Antecubital; Blood  Result Value Ref Range Status   Specimen Description LEFT ANTECUBITAL  Final   Special Requests   Final    BOTTLES DRAWN AEROBIC AND ANAEROBIC Blood Culture adequate volume   Culture   Final    NO GROWTH 1 DAY Performed at [facility], [address] [place], [place]    Report Status PENDING  Incomplete  CULTURE, BLOOD (ROUTINE X 2) w Reflex to ID Panel      Status: None (Preliminary result)   Collection Time: 05/15/20 11:53 PM   Specimen: BLOOD LEFT HAND  Result Value Ref Range Status   Specimen Description BLOOD LEFT HAND  Final   Special Requests   Final    BOTTLES DRAWN AEROBIC AND ANAEROBIC Blood Culture adequate volume   Culture   Final    NO GROWTH 1 DAY Performed at [facility], [address], [place], [place]    Report Status PENDING  Incomplete  MRSA PCR Screening     Status: None   Collection Time: 05/16/20 12:24 AM   Specimen: Nasopharyngeal  Result Value Ref Range Status   MRSA by PCR NEGATIVE NEGATIVE Final    Comment:        The GeneXpert MRSA Assay (FDA approved for NASAL specimens only), is one component of a comprehensive MRSA colonization surveillance program. It is not intended to diagnose MRSA infection nor to guide or monitor treatment for MRSA infections. Performed at [facility], [address], [place], [place] [address]   Urine Culture     Status: None   Collection Time: 05/16/20  3:27 AM   Specimen: Urine, Random  Result Value Ref Range Status   Specimen Description   Final    URINE, RANDOM Performed at [facility], [address], [place], [place] [address]    Special Requests   Final    NONE Performed at [facility], [address], [place], [place] [address]    Culture   Final    NO GROWTH Performed at [facility], [address] [place], [place] [address]    Report Status 05/17/2020 FINAL  Final  Gastrointestinal Panel by PCR , Stool     Status: None   Collection Time: 05/17/20  3:06 PM   Specimen: Stool  Result Value Ref Range Status   Campylobacter species NOT DETECTED NOT DETECTED Final   Plesimonas shigelloides NOT DETECTED NOT DETECTED Final   Salmonella species NOT DETECTED NOT DETECTED Final   Yersinia enterocolitica NOT DETECTED NOT DETECTED Final   Vibrio species NOT DETECTED NOT DETECTED Final   Vibrio cholerae  NOT DETECTED NOT DETECTED Final   Enteroaggregative E coli (EAEC) NOT DETECTED NOT DETECTED Final   Enteropathogenic E coli (EPEC) NOT DETECTED NOT DETECTED Final   Enterotoxigenic E coli (ETEC) NOT DETECTED NOT DETECTED Final   Shiga like toxin producing E coli (STEC) NOT DETECTED NOT DETECTED Final   Shigella/Enteroinvasive E coli (EIEC) NOT DETECTED NOT DETECTED Final   Cryptosporidium NOT DETECTED NOT DETECTED Final   Cyclospora cayetanensis NOT DETECTED NOT DETECTED Final   Entamoeba histolytica NOT DETECTED NOT DETECTED Final   Giardia lamblia NOT DETECTED NOT DETECTED Final   Adenovirus F40/41 NOT DETECTED NOT DETECTED Final   Astrovirus NOT DETECTED NOT DETECTED Final   Norovirus GI/GII NOT DETECTED NOT DETECTED Final   Rotavirus A NOT DETECTED NOT DETECTED Final   Sapovirus (I, II, IV, and V) NOT DETECTED NOT DETECTED Final    Comment: Performed at [facility], [address] [place], [place] [address]  C Difficile Quick Screen (NO PCR Reflex)     Status: None   Collection Time: 05/17/20  3:06 PM   Specimen: STOOL  Result Value Ref Range Status   C Diff antigen NEGATIVE NEGATIVE Final   C Diff toxin NEGATIVE NEGATIVE Final   C Diff interpretation No C. difficile detected.  Final    Comment: Performed at [facility], [address] [place], [place] [address]    IMAGING RESULTS:  I have personally reviewed the films  CT abdomen and pelvis ?Multiple pseudocysts. Compared to 05/12/2020, the largest pseudocyst in the uncinate process has decreased in size to 2.2 cm. A midline pseudocyst has mildly increased from prior. No acute inflammation is currently seen about the pancreas. 2. Large volume ascites with a degree of loculation along the liver, right gutter, and pelvis. 3. Left more than right pleural effusion with atelectasis, not Progressed.   Impression/Recommendation ? ?Complicated medical history.  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  Concern for infected pseudocyst especially with increasing leukocytosis.  Patient is currently on meropenem.  If he does not respond to meropenem then he would need to aspirate the cyst or or possible drain the cyst  into the stomach. May add antifungal tomorrow if white count persist.  Pancreatic ascites with acetic fluid having very high amylase and lipase.  This is secondary to the pancreatitis or could he have a pancreatic duct leak? Because of increasing leukocytosis we need to repeat paracentesis and sent for culture.  Severe anemia a drop of hemoglobin from 15-7.8 raises concern for a GI bleed.  Severe leukocytosis.  Diarrhea secondary to acute pancreatitis/GI bleed.  C. difficile is negative.  Severe malnutrition.  Very poor prognosis. If aggressive intervention has to be pursued because of his age we may have to transfer him to a tertiary center.   Discussed the management with the care team and with the patient's fianc. ___________________________________________________  Note:  This document was prepared using Dragon voice recognition software and may include unintentional dictation errors.

## 2020-05-17 NOTE — Consult Note (Signed)
ANTICOAGULATION CONSULT NOTE  Pharmacy Consult for Heparin infusion  Indication: cerebral venous thrombosis  Patient Measurements: Height: 5\' 7"  (170.2 cm) Weight: 68.3 kg (150 lb 9.2 oz) IBW/kg (Calculated) : 66.1  Vital Signs: Temp: 97 F (36.1 C) (07/08 0400) BP: 105/73 (07/08 0400) Pulse Rate: 80 (07/08 0400)  Labs: Recent Labs    05/15/20 0205 05/15/20 0757 05/15/20 2006 05/15/20 2200 05/16/20 0331 05/16/20 0331 05/16/20 0802 05/16/20 1230 05/16/20 1812 05/17/20 0020 05/17/20 0535  HGB 10.3*  --  9.1*   < > 7.9*   < >  --  8.1*  --   --  7.8*  HCT 28.8*  --  26.4*   < > 22.6*  --   --  23.8*  --   --  22.5*  PLT 191  --  126*  --  151  --   --   --   --   --  144*  HEPARINUNFRC 0.32   < >  --    < >  --   --  0.23*  --  0.31 0.32  --   CREATININE 0.78  --  1.31*  --  1.42*  --   --   --   --   --   --   TROPONINIHS  --   --   --   --  16  --   --   --   --   --   --    < > = values in this interval not displayed.    Estimated Creatinine Clearance: 68.5 mL/min (A) (by C-G formula based on SCr of 1.42 mg/dL (H)).   Medical History: Past Medical History:  Diagnosis Date  . Alcohol use disorder, severe, dependence (HCC)    Assessment: Pharmacy consulted for heparin infusion dosing and monitoring for 34 yo male for cerebral venous thrombosis.   CT Head: "Confirmed thrombus within the confluence of sinuses and left transverse dural venous sinus. 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 No evidence of hemorrhagic conversion." H&H continue to trend down, platelets have normalized  07/05  0040 HL 0.20 subtherapeutic - rate increased to 1150 units/hr  07/05  1004 HL 0.70 supratherapeutic - rate decreased to 1000 units/hr 07/05 1804 HL < 0.10: inc to 1150 units/hr 07/06  0205 HL 0.32: no change 07/06  0757 HL 0.54: decreased to 900 units/hr 07/06  2200 HL 0.20: increased to 1100 units/hr 07/07  0802 HL 0.23: increased to 1150 units/hr 07/07   1812 HL 0.31: no change 07/08  0020 HL 0.32: no change 07/08  0718 HL 0.29: increased to 1200 units/hr  Goal of Therapy:  Heparin level 0.3-0.5 units/ml Monitor platelets by anticoagulation protocol: Yes   Plan:   Heparin level subtherapeutic: increase rate very slightly (based on previous response) to 1200 units/hr   recheck heparin level in 6 hours after rate change  F/U CBC in am  09/08, PharmD Clinical Pharmacist 05/17/2020 6:55 AM

## 2020-05-17 NOTE — Progress Notes (Addendum)
Daily Progress Note   Patient Name: Shane Vanwey Sr.       Date: 05/17/2020 DOB: Jul 07, 1986  Age: 34 y.o. MRN#: [id number] Attending Physician: Delfino Lovett, MD Primary Care Physician: [name], MD Admit Date: 05/03/2020  Reason for Consultation/Follow-up: Establishing goals of care  Subjective: Patient is resting in bed with chaplain at bedside. HPOA papers completed.   Returned to room. Fiance, fiance's mother, at bedside as well as friend Channing Mutters. He is oriented today, but states he is so weak he can not move himself in bed. He is off Lawyer, but still has pressor in place. We discussed his feelings on healthcare prior to his admission. We discussed acceptable QOL and ETOH use following D/C. GI came in during my conversation. We discussed labs, that he is not a candidate for a PEG, and plans for further workup.   Patient states he is not ready to shift to comfort and wants to keep going, treating the treatable for his son and Kathie Rhodes. He would not want a Doboff tube or NGT for nutrition, and understands TPN is not a long term option, and that he would die if he does not eat and drink enough to sustain himself.  He states he would not want to be placed on a ventilator, and would not want CPR if his heart or breathing stops; he would want to die naturally. Much support offered to patient and family and all questions answered. Patient would like to marry his fiance. Chaplain to assist with this. Assisted with initiation of E link to son.    I completed a MOST form today and the signed original was placed in the chart. A photocopy was placed in the chart to be scanned into EMR. The patient outlined their wishes for the following treatment decisions:  Cardiopulmonary Resuscitation: Do Not Attempt  Resuscitation (DNR/No CPR)  Medical Interventions: Limited Additional Interventions: Use medical treatment, IV fluids and cardiac monitoring as indicated, DO NOT USE intubation or mechanical ventilation. May consider use of less invasive airway support such as BiPAP or CPAP. Also provide comfort measures. Transfer to the hospital if indicated. Avoid intensive care.   Antibiotics: Antibiotics if indicated  IV Fluids: IV fluids if indicated  Feeding Tube: No feeding tube      Length of Stay: 14  Current Medications:  Scheduled Meds:  . Chlorhexidine Gluconate Cloth  6 each Topical Daily  . Gerhardt's butt cream   Topical TID  . insulin aspart  0-9 Units Subcutaneous Q6H  . pantoprazole (PROTONIX) IV  40 mg Intravenous Q12H  . sodium chloride flush  3 mL Intravenous Q12H    Continuous Infusions: . sodium chloride Stopped (05/13/20 1628)  . heparin 1,200 Units/hr (05/17/20 0900)  . meropenem (MERREM) IV 1 g (05/17/20 0903)  . norepinephrine (LEVOPHED) Adult infusion 10 mcg/min (05/17/20 0600)  . norepinephrine (LEVOPHED) Adult infusion Stopped (05/16/20 0503)  . TPN ADULT (ION) 30 mL/hr at 05/17/20 0600  . TPN ADULT (ION)      PRN Meds: sodium chloride, hydrALAZINE, HYDROmorphone (DILAUDID) injection, labetalol, loperamide, LORazepam, ondansetron **OR** ondansetron (ZOFRAN) IV  Physical Exam Pulmonary:     Effort: Pulmonary effort is normal.  Neurological:     Mental Status: He is alert.             Vital Signs: BP 95/61   Pulse 95   Temp 98.1 F (36.7 C)   Resp (!) 22   Ht 5\' 7"  (1.702 m)   Wt 68.3 kg   SpO2 97%   BMI 23.58 kg/m  SpO2: SpO2: 97 % O2 Device: O2 Device: Room Air O2 Flow Rate:    Intake/output summary:   Intake/Output Summary (Last 24 hours) at 05/17/2020 1551 Last data filed at 05/17/2020 1213 Gross per 24 hour  Intake 1393.12 ml  Output 1525 ml  Net -131.88 ml   LBM: Last BM Date: 05/17/20 Baseline Weight: Weight: 63.5 kg Most recent weight:  Weight: 68.3 kg       Palliative Assessment/Data:      Patient Active Problem List   Diagnosis Date Noted  . Acute alcoholic pancreatitis 05/10/2020  . Acute metabolic encephalopathy 05/10/2020  . Severe protein-calorie malnutrition (HCC) 05/10/2020  . Sinus tachycardia 05/10/2020  . Alcohol withdrawal seizure with delirium (HCC) 05/03/2020  . Hyponatremia 05/03/2020  . Hypokalemia 05/03/2020  . AKI (acute kidney injury) (HCC) 05/03/2020  . Elevated CK 05/03/2020  . Heme positive stool 05/03/2020  . Thrombocytopenia (HCC) 05/03/2020    Palliative Care Assessment & Plan   Recommendations/Plan: Chaplain helping with marriage here. DNR/DNI, no feeding tube. Treat the treatable.   Code Status:    Code Status Orders  (From admission, onward)         Start     Ordered   05/03/20 2153  Full code  Continuous        05/03/20 2155        Code Status History    Date Active Date Inactive Code Status Order ID Comments User Context   05/03/2020 2016 05/03/2020 2155 Full Code 2156  [id number], MD ED   Advance Care Planning Activity      Prognosis: Poor overall    Care plan was discussed with RN, physicians  Thank you for allowing the Palliative Medicine Team to assist in the care of this patient.   Time In: 1:00 Time Out: 3:50 Total Time 2 hours 40 min Prolonged Time Billed  yes      Greater than 50%  of this time was spent counseling and coordinating care related to the above assessment and plan.  Willy Eddy, NP  Please contact Palliative Medicine Team phone at [phone] for questions and concerns.

## 2020-05-17 NOTE — Consult Note (Signed)
ANTICOAGULATION CONSULT NOTE  Pharmacy Consult for Heparin infusion  Indication: cerebral venous thrombosis  Patient Measurements: Height: 5\' 7"  (170.2 cm) Weight: 68.3 kg (150 lb 9.2 oz) IBW/kg (Calculated) : 66.1  Vital Signs: Temp: 98.1 F (36.7 C) (07/08 1600) BP: 103/59 (07/08 1600) Pulse Rate: 96 (07/08 1600)  Labs: Recent Labs    05/15/20 2006 05/15/20 2200 05/16/20 0331 05/16/20 0802 05/16/20 1230 05/16/20 1812 05/17/20 0020 05/17/20 0535 05/17/20 0718 05/17/20 1611  HGB 9.1*   < > 7.9*  --  8.1*  --   --  7.8*  --   --   HCT 26.4*   < > 22.6*  --  23.8*  --   --  22.5*  --   --   PLT 126*  --  151  --   --   --   --  144*  --   --   HEPARINUNFRC  --    < >  --    < >  --    < > 0.32  --  0.29* 0.12*  CREATININE 1.31*  --  1.42*  --   --   --   --  1.18  --   --   TROPONINIHS  --   --  16  --   --   --   --   --   --   --    < > = values in this interval not displayed.    Estimated Creatinine Clearance: 82.5 mL/min (by C-G formula based on SCr of 1.18 mg/dL).   Medical History: Past Medical History:  Diagnosis Date  . Alcohol use disorder, severe, dependence (HCC)    Assessment: Pharmacy consulted for heparin infusion dosing and monitoring for 34 yo male for cerebral venous thrombosis.   CT Head: "Confirmed thrombus within the confluence of sinuses and left transverse dural venous sinus. 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 No evidence of hemorrhagic conversion." H&H continue to trend down, platelets have normalized  07/05  0040 HL 0.20 subtherapeutic - rate increased to 1150 units/hr  07/05  1004 HL 0.70 supratherapeutic - rate decreased to 1000 units/hr 07/05 1804 HL < 0.10: inc to 1150 units/hr 07/06  0205 HL 0.32: no change 07/06  0757 HL 0.54: decreased to 900 units/hr 07/06  2200 HL 0.20: increased to 1100 units/hr 07/07  0802 HL 0.23: increased to 1150 units/hr 07/07  1812 HL 0.31: no change 07/08  0020 HL 0.32: no  change 07/08  0718 HL 0.29: increased to 1200 units/hr 07/08  1611 HL 0.12 increase to 1350 units/hr    Goal of Therapy:  Heparin level 0.3-0.5 units/ml Monitor platelets by anticoagulation protocol: Yes   Plan:   Heparin level subtherapeutic: increase rate very slightly (based on previous response) to 1350 units/hr   recheck heparin level in 6 hours after rate change  F/U CBC in am  09/08, PharmD, BCPS Clinical Pharmacist 05/17/2020 5:47 PM

## 2020-05-17 NOTE — Progress Notes (Addendum)
CH visited pt. after seeing him awake in rm., as follow-up from request for visit from fiance yesterday.  Pt. lying down in bed; pt.'s movements slow, speech slightly slurred, overall appears to be very tired, but grateful for [facility] visit.  CH spent an extended time exploring pt.'s current experience of being in ICU in critical care from ETOH abuse.  Pt. expressed significant regret for 'leaving my wife and son like this' and for being so weak.  'I never knew alcohol could do all this', he shared tearfully.  CH attempted to acknowledge pt.'s feelings of guilt and offer suggestion of forgiveness being available from God, per pt.'s indications that he is a [place].  CH asked Pt. if he felt like he needed to make anything right, spiritually but pt.'s concern was more focused around asking forgiveness from his fiance and those he has wronged, including his two children from prior marriage.  Pt. requested CH play Christian music in rm.   Ch helped pt. complete MPOA paperwork; fiance BettyAnn made HCA; friend Betsey Amen is alt. MPOA.  Pt. declines to make Living Will at this time.  Copy placed in chart and brought to MR for scanning.  Pt. concerned for fiance BettyAnn to come to hospital and said he would like to pray w/CH once she arrives.  At pt.'s request, CH called fiance to check if she is coming to visit today.  Pt. also wanted to contact friend Channing Mutters.  Both arrived just as CH was leaving rm.

## 2020-05-17 NOTE — Progress Notes (Signed)
CH visited pt. per page from ICU charge RN/palliative care to discuss potential in-hospital wedding b/w pt. and fiance BettyAnn.  When CH entered rm. pt. lying down in bed w/fiance at bedside.  Pt. drowsy and speaking/moving slowly, but awake.  Pt. reports still 'feeling bad'; enjoying vanilla coke in cup.  After fiance prompted pt., asking about 'tying the knot', CH, pt. and fiance discussed possibility of pt. marrying fiance in hospital.  CH explained fiance would need to obtain marriage license from [employer] courthouse in [place]; fiance asked if her pastor could come to perform ceremony if ordained [facility] chaplain is not available and CH said this could be arranged.  Fiance called courthouse --> usually both parties must be present in order to obtain license but exception seems possible if pt. can sign additional form before notary and bring that form back to the courthouse; fiance also learned she needs to gather documents to verify her and pt.'s identity and (in pt.'s case) divorce from previous marriage.  Fiance will contact chaplains if she needs additional assistance once paperwork is obtained. CH remained w/pt. and fiance at bedside for some time and attempted to assess whether pt. had other spiritual and emotional concerns that he wanted to discuss or address.  Pt. admitted to being 'a little' frightened of death, but also asked 'isn't everyone?'  Pt. seemed very fatigued after so many conversations today.  CH will follow up tomorrow to assess pt. and family's needs.    05/17/20 1600  Clinical Encounter Type  Visited With Patient and family together  Visit Type Follow-up;Psychological support;Spiritual support;Social support;Critical Care  Referral From Palliative care team;Nurse  Consult/Referral To Chaplain  Spiritual Encounters  Spiritual Needs Emotional  Stress Factors  Patient Stress Factors Loss of control;Major life changes;Health changes;Financial concerns;Family  relationships (Remorse; regret)  Family Stress Factors Loss of control;Health changes

## 2020-05-17 NOTE — Progress Notes (Signed)
Subjective: Patient more alert today.  Speech comprehensible.    Objective: Current vital signs: BP 129/75   Pulse 84   Temp 98.8 F (37.1 C)   Resp (!) 25   Ht '5\' 7"'  (1.702 m)   Wt 68.3 kg   SpO2 98%   BMI 23.58 kg/m  Vital signs in last 24 hours: Temp:  [94.6 F (34.8 C)-99 F (37.2 C)] 98.8 F (37.1 C) (07/08 0900) Pulse Rate:  [77-96] 84 (07/08 0900) Resp:  [19-42] 25 (07/08 0900) BP: (89-134)/(54-100) 129/75 (07/08 0900) SpO2:  [91 %-100 %] 98 % (07/08 0900)  Intake/Output from previous day: 07/07 0701 - 07/08 0700 In: 1613.4 [P.O.:330; I.V.:870.3; IV Piggyback:413.2] Out: 925 [Urine:925] Intake/Output this shift: Total I/O In: 100 [IV Piggyback:100] Out: -  Nutritional status:  Diet Order            Diet regular Room service appropriate? Yes; Fluid consistency: Thin  Diet effective now                 Neurologic Exam: Mental Status: Alert.  Dysarthric but understandable.  Follows some simple commands.   Cranial Nerves: II: Blinks to bilateral confrontation.   III,IV, VI: ptosis not present, extra-ocular motions intact bilaterally V,VII: smile symmetric, facial light touch sensation normal bilaterally VIII: hearing normal bilaterally IX,X: gag reflex present XI: bilateral shoulder shrug XII: midline tongue extension Motor: Moves all extremities weakly against gravity Sensory: Pinprick and light touch intact throughout, bilaterally  Lab Results: Basic Metabolic Panel: Recent Labs  Lab 05/12/20 0557 05/12/20 0557 05/13/20 0618 05/13/20 0618 05/14/20 0040 05/14/20 0040 05/15/20 0205 05/15/20 0205 05/15/20 2006 05/16/20 0331 05/17/20 0535  NA 134*   < > 135   < > 135  --  136  --  137 137 139  K 3.1*   < > 3.8   < > 3.4*  --  3.8  --  3.6 3.9 3.6  CL 97*   < > 101   < > 99  --  103  --  103 102 106  CO2 27   < > 26   < > 28  --  24  --  25 22 19*  GLUCOSE 111*   < > 113*   < > 141*  --  132*  --  147* 126* 139*  BUN 14   < > 16   < > 17   --  20  --  26* 29* 24*  CREATININE 0.65   < > 0.56*   < > 0.66  --  0.78  --  1.31* 1.42* 1.18  CALCIUM 7.8*   < > 7.9*   < > 7.3*   < > 6.1*   < > 5.2* 5.3* 5.5*  MG 1.6*   < > 1.7  --  2.0  --  1.8  --   --  1.9 1.7  PHOS 4.3  --  4.3  --   --   --  3.7  --   --  4.7* 4.9*   < > = values in this interval not displayed.    Liver Function Tests: Recent Labs  Lab 05/14/20 0040 05/15/20 0205 05/15/20 2006 05/16/20 0331 05/17/20 0535  AST 34 39 39 36 43*  ALT '24 25 24 21 20  ' ALKPHOS 87 81 79 71 99  BILITOT 0.7 0.7 0.7 0.8 0.6  PROT 4.4* 4.4* 4.1* 4.9* 4.3*  ALBUMIN 1.6* 1.5* 1.4* 2.8* 1.8*   Recent Labs  Lab  05/13/20 0618 05/14/20 0040 05/15/20 0205 05/16/20 0331 05/17/20 0535  LIPASE 1,543* 1,510* 1,141* 912* 337*   Recent Labs  Lab 05/12/20 1117 05/15/20 2006  AMMONIA <9* 17    CBC: Recent Labs  Lab 05/11/20 0603 05/11/20 0603 05/12/20 0557 05/12/20 0557 05/13/20 0618 05/13/20 0618 05/14/20 0040 05/14/20 0040 05/15/20 0205 05/15/20 2006 05/16/20 0331 05/16/20 1230 05/17/20 0535  WBC 19.5*   < > 19.4*   < > 20.4*   < > 20.3*  --  26.3* 34.2* 40.4*  --  39.8*  NEUTROABS 15.9*  --  16.4*  --  17.5*  --   --   --   --  31.7*  --   --   --   HGB 11.1*   < > 10.4*   < > 10.5*   < > 10.1*   < > 10.3* 9.1* 7.9* 8.1* 7.8*  HCT 31.2*   < > 29.5*   < > 30.3*   < > 28.6*   < > 28.8* 26.4* 22.6* 23.8* 22.5*  MCV 92.0   < > 92.5   < > 94.4   < > 93.2  --  90.6 95.0 95.4  --  94.9  PLT 219   < > 239   < > 246   < > 185  --  191 126* 151  --  144*   < > = values in this interval not displayed.    Cardiac Enzymes: No results for input(s): CKTOTAL, CKMB, CKMBINDEX, TROPONINI in the last 168 hours.  Lipid Panel: Recent Labs  Lab 05/16/20 1230  TRIG 45    CBG: Recent Labs  Lab 05/15/20 1827 05/16/20 1948 05/16/20 2339 05/17/20 0401 05/17/20 0712  GLUCAP 139* 143* 187* 39 146*    Microbiology: Results for orders placed or performed during the hospital  encounter of 05/03/20  SARS Coronavirus 2 by RT PCR (hospital order, performed in [facility] hospital lab) Nasopharyngeal Nasopharyngeal Swab     Status: None   Collection Time: 05/03/20  8:25 PM   Specimen: Nasopharyngeal Swab  Result Value Ref Range Status   SARS Coronavirus 2 NEGATIVE NEGATIVE Final    Comment: (NOTE) SARS-CoV-2 target nucleic acids are NOT DETECTED.  The SARS-CoV-2 RNA is generally detectable in upper and lower respiratory specimens during the acute phase of infection. The lowest concentration of SARS-CoV-2 viral copies this assay can detect is 250 copies / mL. A negative result does not preclude SARS-CoV-2 infection and should not be used as the sole basis for treatment or other patient management decisions.  A negative result may occur with improper specimen collection / handling, submission of specimen other than nasopharyngeal swab, presence of viral mutation(s) within the areas targeted by this assay, and inadequate number of viral copies (<250 copies / mL). A negative result must be combined with clinical observations, patient history, and epidemiological information.  Fact Sheet for Patients:   StrictlyIdeas.no  Fact Sheet for Healthcare Providers: BankingDealers.co.za  This test is not yet approved or  cleared by the [place] FDA and has been authorized for detection and/or diagnosis of SARS-CoV-2 by FDA under an Emergency Use Authorization (EUA).  This EUA will remain in effect (meaning this test can be used) for the duration of the COVID-19 declaration under Section 564(b)(1) of the Act, 21 U.S.C. section 360bbb-3(b)(1), unless the authorization is terminated or revoked sooner.  Performed at [facility], [address], [place], [place] [address]   MRSA PCR Screening  Status: None   Collection Time: 05/03/20 11:57 PM   Specimen: Nasopharyngeal  Result Value Ref Range Status    MRSA by PCR NEGATIVE NEGATIVE Final    Comment:        The GeneXpert MRSA Assay (FDA approved for NASAL specimens only), is one component of a comprehensive MRSA colonization surveillance program. It is not intended to diagnose MRSA infection nor to guide or monitor treatment for MRSA infections. Performed at [facility], [address] [place], [place] [address]   Body fluid culture     Status: None   Collection Time: 05/07/20 12:00 PM   Specimen: PATH Cytology Peritoneal fluid  Result Value Ref Range Status   Specimen Description   Final    PERITONEAL Performed at [facility], [address] [place], [place] [address]    Special Requests   Final    PERITONEAL Performed at [facility], [address] [place], [place] [address]    Gram Stain   Final    WBC PRESENT,BOTH PMN AND MONONUCLEAR NO ORGANISMS SEEN CYTOSPIN SMEAR    Culture   Final    NO GROWTH 3 DAYS Performed at [facility], [place] [address] [place], [place] [address]    Report Status 05/11/2020 FINAL  Final  Culture, blood (single) w Reflex to ID Panel     Status: None   Collection Time: 05/11/20  7:59 AM   Specimen: BLOOD  Result Value Ref Range Status   Specimen Description BLOOD LEFT WRIST  Final   Special Requests   Final    BOTTLES DRAWN AEROBIC AND ANAEROBIC Blood Culture adequate volume   Culture   Final    NO GROWTH 5 DAYS Performed at [facility], [address] [place], [place] [address]    Report Status 05/16/2020 FINAL  Final  CULTURE, BLOOD (ROUTINE X 2) w Reflex to ID Panel     Status: None (Preliminary result)   Collection Time: 05/15/20 11:52 PM   Specimen: Left Antecubital; Blood  Result Value Ref Range Status   Specimen Description LEFT ANTECUBITAL  Final   Special Requests   Final    BOTTLES DRAWN AEROBIC AND ANAEROBIC Blood Culture adequate volume   Culture   Final    NO GROWTH 1 DAY Performed at [facility], [address], [place], [place] [address]    Report Status PENDING  Incomplete  CULTURE, BLOOD (ROUTINE X 2) w Reflex to ID Panel     Status: None (Preliminary result)   Collection Time: 05/15/20 11:53 PM   Specimen: BLOOD LEFT HAND  Result Value Ref Range Status   Specimen Description BLOOD LEFT HAND  Final   Special Requests   Final    BOTTLES DRAWN AEROBIC AND ANAEROBIC Blood Culture adequate volume   Culture   Final    NO GROWTH 1 DAY Performed at [facility], [address] [place], [place] [address]    Report Status PENDING  Incomplete  MRSA PCR Screening     Status: None   Collection Time: 05/16/20 12:24 AM   Specimen: Nasopharyngeal  Result Value Ref Range Status   MRSA by PCR NEGATIVE NEGATIVE Final    Comment:        The GeneXpert MRSA Assay (FDA approved for NASAL specimens only), is one component of a comprehensive MRSA colonization surveillance program. It is not intended to diagnose MRSA infection nor to guide or monitor treatment for MRSA infections. Performed  at [facility], [address] [place], [place] [address]   Urine Culture     Status: None   Collection Time: 05/16/20  3:27 AM   Specimen: Urine, Random  Result Value Ref Range Status   Specimen Description   Final    URINE, RANDOM Performed at [facility], [address], [place], [place] [address]    Special Requests   Final    NONE Performed at [facility], [address], [place], [place] [address]    Culture   Final    NO GROWTH Performed at [facility], [place] [address], [place], [place] [address]    Report Status 05/17/2020 FINAL  Final    Coagulation Studies: No results for input(s): LABPROT, INR in the last 72 hours.  Imaging: CT ABDOMEN PELVIS WO CONTRAST  Result Date: 05/16/2020 CLINICAL DATA:  Acute, severe, pancreatitis EXAM: CT ABDOMEN AND PELVIS WITHOUT CONTRAST TECHNIQUE: Multidetector CT imaging of the abdomen and pelvis was  performed following the standard protocol without IV contrast. COMPARISON:  MRCP 05/12/2020 FINDINGS: Lower chest: Moderate left and small right pleural effusions with lower lobe atelectasis. Low-density blood pool suggesting anemia. Hepatobiliary: No focal liver abnormality.No evidence of biliary obstruction or stone. Pancreas: Peripancreatic edema may be improved. 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 The dominant pseudocyst at the uncinate process has decreased to 2.2 cm. A cyst in the midline pancreas is mildly increased to 15 mm. Spleen: Unremarkable. Adrenals/Urinary Tract: Negative adrenals. No hydronephrosis or stone. Collapsed bladder by Foley catheter Stomach/Bowel: Rectal tube in place. No bowel obstruction or definite inflammation. Vascular/Lymphatic: No acute vascular abnormality. No mass or adenopathy. Reproductive:No pathologic findings. Other: Ascites with areas of loculation around the liver, right gutter, and pelvis. The volume is not changed from comparison MRCP. The thickest pocket met measures 4 cm over the liver and 7 cm inferior to the liver. Reticulation of the omentum which is likely edema by prior MRI. Anasarca. Musculoskeletal: Healing lateral left sixth and seventh rib fractures. IMPRESSION: 1. Multiple pseudocysts. Compared to 05/12/2020, the largest pseudocyst in the uncinate process has decreased in size to 2.2 cm. A midline pseudocyst has mildly increased from prior. No acute inflammation is currently seen about the pancreas. 2. Large volume ascites with a degree of loculation along the liver, right gutter, and pelvis. 3. Left more than right pleural effusion with atelectasis, not progressed. Electronically Signed   By: Monte Fantasia M.D.   On: 05/16/2020 04:35   MR BRAIN WO CONTRAST  Result Date: 05/15/2020 CLINICAL DATA:  Dural venous sinus thrombosis EXAM: MRI HEAD WITHOUT CONTRAST TECHNIQUE: Multiplanar, multiecho pulse sequences of the brain and surrounding structures were  obtained without intravenous contrast. COMPARISON:  Brain MRI 05/12/2020 FINDINGS: Brain: There are areas of residual abnormal diffusion restriction over the right posterior parietal lobe and right cerebellum. No new diffusion abnormality. Normal white matter signal. Normal volume of CSF spaces. No chronic microhemorrhage. Vascular: Normal arterial flow voids. Skull and upper cervical spine: Normal marrow signal Sinuses/Orbits: Negative. Other: None. IMPRESSION: Residual abnormal diffusion restriction over the right posterior parietal lobe and right cerebellum, consistent with expected evolution of subacute venous infarcts. No new diffusion abnormality. Electronically Signed   By: Ulyses Jarred M.D.   On: 05/15/2020 22:55   US Paracentesis  Result Date: 05/16/2020 INDICATION: History of alcoholic cirrhosis, now with symptomatic ascites. Please perform ultrasound-guided paracentesis for therapeutic purposes. EXAM: ULTRASOUND-GUIDED PARACENTESIS COMPARISON:  CT abdomen pelvis-05/16/2020; ultrasound-guided paracentesis performed 05/07/2020 (yielding  2.8 L of peritoneal fluid) MEDICATIONS: None. COMPLICATIONS: None immediate. TECHNIQUE: Informed written consent was obtained from the patient after a discussion of the risks, benefits and alternatives to treatment. A timeout was performed prior to the initiation of the procedure. Initial ultrasound scanning demonstrates a moderate amount of ascites within the right lower abdomen which was subsequently prepped and draped in the usual sterile fashion. 1% lidocaine with epinephrine was used for local anesthesia. Under direct ultrasound guidance, a 19 gauge, 7-cm, Yueh catheter was introduced. An ultrasound image was saved for documentation purposed. The paracentesis was performed. The catheter was removed and a dressing was applied. The patient tolerated the procedure well without immediate post procedural complication. FINDINGS: A total of approximately 3 liters of dark  serous ascitic fluid was removed. IMPRESSION: Successful ultrasound-guided paracentesis yielding 3 liters of peritoneal fluid. Electronically Signed   By: Sandi Mariscal M.D.   On: 05/16/2020 15:55   DG Chest Port 1 View  Result Date: 05/15/2020 CLINICAL DATA:  Status post PICC line placement EXAM: PORTABLE CHEST 1 VIEW COMPARISON:  05/03/2020 FINDINGS: Cardiac shadow is stable. Increasing right-sided pleural effusion and likely underlying atelectasis is present. New right-sided PICC line is noted with the tip in the mid right atrium. This could be withdrawn as much as 3 cm as necessary. Right lung is clear. Small right effusion is noted. No bony abnormality is seen. IMPRESSION: Bilateral pleural effusions left greater than right. Associated left basilar atelectasis/infiltrate is present. PICC line as described in the mid right atrium. This could be withdrawn as many as 3 cm as clinically indicated. Electronically Signed   By: Inez Catalina M.D.   On: 05/15/2020 19:27   ECHOCARDIOGRAM COMPLETE  Result Date: 05/17/2020    ECHOCARDIOGRAM REPORT   Patient Name:   Shane Foxworth Sr. Date of Exam: 05/16/2020 Medical Rec #:  [id number]       Height:       67.0 in Accession #:    [id number]      Weight:       150.6 lb Date of Birth:  [birth year]        BSA:          1.792 m Patient Age:    34 years        BP:           94/63 mmHg Patient Gender: M               HR:           91 bpm. Exam Location:  [facility] Procedure: 2D Echo, Cardiac Doppler and Color Doppler Indications:     Shock  History:         Patient has no prior history of Echocardiogram examinations.                  Risk Factors:Alochol Abuse.  Sonographer:     Wilford Sports Rodgers-Wyrick Referring Phys:  [id number] Bradly Bienenstock Diagnosing Phys: Ida Rogue MD IMPRESSIONS  1. Left ventricular ejection fraction, by estimation, is 60 to 65%. The left ventricle has normal function. The left ventricle has no regional wall motion abnormalities. Left ventricular diastolic  parameters were normal.  2. Right ventricular systolic function is normal. The right ventricular size is normal.  3. The mitral valve is normal in structure. No evidence of mitral valve regurgitation. No evidence of mitral stenosis.  4. The aortic valve is normal in structure. Aortic valve regurgitation is not visualized. No aortic stenosis is present.  5. The inferior vena cava is normal in size with greater than 50% respiratory variability, suggesting right atrial pressure of 3 mmHg. FINDINGS  Left Ventricle: Left ventricular ejection fraction, by estimation, is 60 to 65%. The left ventricle has normal function. The left ventricle has no regional wall motion abnormalities. The left ventricular internal cavity size was normal in size. There is  no left ventricular hypertrophy. Left ventricular diastolic parameters were normal. Right Ventricle: The right ventricular size is normal. No increase in right ventricular wall thickness. Right ventricular systolic function is normal. Left Atrium: Left atrial size was normal in size. Right Atrium: Right atrial size was normal in size. Pericardium: There is no evidence of pericardial effusion. Mitral Valve: The mitral valve is normal in structure. Normal mobility of the mitral valve leaflets. No evidence of mitral valve regurgitation. No evidence of mitral valve stenosis. Tricuspid Valve: The tricuspid valve is normal in structure. Tricuspid valve regurgitation is not demonstrated. No evidence of tricuspid stenosis. Aortic Valve: The aortic valve is normal in structure. Aortic valve regurgitation is not visualized. No aortic stenosis is present. Pulmonic Valve: The pulmonic valve was normal in structure. Pulmonic valve regurgitation is not visualized. No evidence of pulmonic stenosis. Aorta: The aortic root is normal in size and structure. Venous: The inferior vena cava is normal in size with greater than 50% respiratory variability, suggesting right atrial pressure of 3  mmHg. IAS/Shunts: No atrial level shunt detected by color flow Doppler.  LEFT VENTRICLE PLAX 2D LVIDd:         4.34 cm  Diastology LVIDs:         2.77 cm  LV e' lateral:   10.30 cm/s LV PW:         0.97 cm  LV E/e' lateral: 10.0 LV IVS:        0.87 cm  LV e' medial:    9.79 cm/s LVOT diam:     2.10 cm  LV E/e' medial:  10.5 LV SV:         59 LV SV Index:   33 LVOT Area:     3.46 cm  RIGHT VENTRICLE             IVC RV Basal diam:  3.44 cm     IVC diam: 1.92 cm RV S prime:     16.90 cm/s TAPSE (M-mode): 2.5 cm LEFT ATRIUM             Index       RIGHT ATRIUM          Index LA diam:        3.60 cm 2.01 cm/m  RA Area:     9.62 cm LA Vol (A2C):   40.9 ml 22.82 ml/m RA Volume:   21.10 ml 11.77 ml/m LA Vol (A4C):   29.0 ml 16.18 ml/m LA Biplane Vol: 34.8 ml 19.42 ml/m  AORTIC VALVE LVOT Vmax:   88.60 cm/s LVOT Vmean:  61.900 cm/s LVOT VTI:    0.169 m  AORTA Ao Root diam: 3.50 cm MITRAL VALVE MV Area (PHT): 2.39 cm     SHUNTS MV Decel Time: 317 msec     Systemic VTI:  0.17 m MV E velocity: 103.00 cm/s  Systemic Diam: 2.10 cm MV A velocity: 107.00 cm/s MV E/A ratio:  0.96 Ida Rogue MD Electronically signed by Ida Rogue MD Signature Date/Time: 05/17/2020/7:45:35 AM    Final     Medications:  I have reviewed the patient's current medications.  Scheduled: . Chlorhexidine Gluconate Cloth  6 each Topical Daily  . Gerhardt's butt cream   Topical TID  . insulin aspart  0-9 Units Subcutaneous Q6H  . pantoprazole (PROTONIX) IV  40 mg Intravenous Q12H  . sodium chloride flush  3 mL Intravenous Q12H    Assessment/Plan: 34 y.o.malewith a long history of ETOH abuse. Abruptly stopped drinking the day before presentation (6/24) and became progressively confused. Was brought to the ED by his wife and while in the ED was witnessed to have a seizure. Patient has not had further seizures but remained confused and lethargic.MRI of the brain personally reviewed and reveals a possible cerebral venous thrombosis  as well as abnormal diffusion in the right cerebellum, bilateral parietal lobes and left occipital pole. 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 Thyroid normal. EEG only significant for slowing.  B1 pending.Mental status worsened on 7/6.  Repeat MRI of the brain repeated and showed no new infarcts.  No evidence of hydrocephalus or hemorrhage.  Paracentesis performed on yesterday with 3L fluid obtained.  Patient with improved mental status today.    Recommendations: 1.Continue heparin 2. Thiamine level pending   LOS: 14 days   Alexis Goodell, MD Neurology [phone] 05/17/2020  10:13 AM

## 2020-05-17 NOTE — Progress Notes (Signed)
Assisted tele visit to patient with family members.  Maryelizabeth Eberle Anderson, RN   

## 2020-05-17 NOTE — Plan of Care (Signed)
Pt still on levo gtt and heparin gtt. Attempted to wean off levo nut BP will drop. Next heparin level will be at 0700. Pt is mor alert and oriented this morning, speech is mor clearly and understandable. He stated is hurting all over and rated his "20/10" prn was give,  pt asked for grape juice and drank 1can , one can of cook and a cup of H20 and tolerated it well, bath and chg bath was given. Pt still have some edema worse on his bilat ankle +4 pitting. Urine out have improved since late night.

## 2020-05-17 NOTE — Consult Note (Signed)
ANTICOAGULATION CONSULT NOTE  Pharmacy Consult for Heparin infusion  Indication: cerebral venous thrombosis  Patient Measurements: Height: 5\' 7"  (170.2 cm) Weight: 68.3 kg (150 lb 9.2 oz) IBW/kg (Calculated) : 66.1  Vital Signs: Temp: 98.6 F (37 C) (07/08 0000) Temp Source: Rectal (07/07 1800) BP: 101/57 (07/08 0000) Pulse Rate: 79 (07/08 0000)  Labs: Recent Labs    05/15/20 0205 05/15/20 0757 05/15/20 2006 05/15/20 2200 05/16/20 0331 05/16/20 0802 05/16/20 1230 05/16/20 1812 05/17/20 0020  HGB 10.3*  --  9.1*   < > 7.9*  --  8.1*  --   --   HCT 28.8*  --  26.4*  --  22.6*  --  23.8*  --   --   PLT 191  --  126*  --  151  --   --   --   --   HEPARINUNFRC 0.32   < >  --    < >  --  0.23*  --  0.31 0.32  CREATININE 0.78  --  1.31*  --  1.42*  --   --   --   --   TROPONINIHS  --   --   --   --  16  --   --   --   --    < > = values in this interval not displayed.    Estimated Creatinine Clearance: 68.5 mL/min (A) (by C-G formula based on SCr of 1.42 mg/dL (H)).   Medical History: Past Medical History:  Diagnosis Date   Alcohol use disorder, severe, dependence (HCC)    Assessment: Pharmacy consulted for heparin infusion dosing and monitoring for 34 yo male for cerebral venous thrombosis.   CT Head: "Confirmed thrombus within the confluence of sinuses and left transverse dural venous sinus. 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 No evidence of hemorrhagic conversion." H&H continue to trend down, platelets have normalized  07/05 @ 0040 HL 0.20 subtherapeutic - rate increased to 1150 units/hr  07/05 @ 1004 HL 0.70 supratherapeutic - rate decreased to 1000 units/hr 07/05 @ 1804 HL < 0.10: inc to 1150 units/hr 07/06 @ 0205 HL 0.32: no change 07/06 @ 0757 HL 0.54: decreased to 900 units/hr 07/06 @ 2200 HL 0.20: increased to 1100 units/hr 07/07 @ 0802 HL 0.23: increased to 1150 units/hr  Goal of Therapy:  Heparin level 0.3-0.5  units/ml Monitor platelets by anticoagulation protocol: Yes   Plan:   Heparin level subtherapeutic: increase rate very slightly (based on previous response) to 1150 units/hr   recheck heparin level in 6 hours  F/U CBC in am  7/7 : HL @ 1812 = 0.31 Will continue pt on current rate and recheck HL on 7/8 @ 0000.  7/8 : HL @ 0020 = 0.32, therapeutic x 2.  Continue current rate and recheck HL and CBC daily per protocol  09/07, PharmD Clinical Pharmacist 05/17/2020 1:12 AM

## 2020-05-17 NOTE — Consult Note (Signed)
PHARMACY - TOTAL PARENTERAL NUTRITION CONSULT NOTE   Indication: severe malnutrition  Patient Measurements: Height: 5\' 7"  (170.2 cm) Weight: 68.3 kg (150 lb 9.2 oz) IBW/kg (Calculated) : 66.1 TPN AdjBW (KG): 68.3 Body mass index is 23.58 kg/m.  Assessment: 34 year old male with PMHx of EtOH abuse admitted with alcohol withdrawal, acute alcoholic pancreatitis, acute metabolic encephalopathy, ascites s/p paracentesis on 6/28 with 2.8 L fluid removed, chest pain, acute renal failure.   Glucose / Insulin: sSSI q6h  CBG 79 - 187 (required 2 units SSI) Electrolytes: continued hypocalcemia, borderline hypomagnesemia CO2 continues to trend down, Cl up Renal: SCr 1.75-->0.54-->1.42-->1.18 LFTs / TGs: AST/ALT: 43/20 U/L at baseline Prealbumin / albumin: albumin 1.8 g/dL (trending down) Intake / Output; MIVF: 1613/688 mL, no MIVF GI Imaging: none since TPN began Surgeries / Procedures:  7/7: US-guided paracentesis: 3L removed  Central access: 05/16/20 TPN start date: 05/16/20  Nutritional Goals (per RD recommendation on 05/16/20): kCal: 1900 - 2200 / day, Protein: 95 - 105 grams/day, Fluid: 1900 - 2200 mL/day Goal TPN rate is 80 mL/hr (provides 105 g of protein and 2200 kcals per day)  Current Nutrition:  Regular Diet (currently too lethargic to consume much although improved somewhat)  Plan: MG 1.7  Increase TPN rate to 36mL/hr at 1800  Electrolytes in standard (with the exception of phosphate being minimized and magnesium maximized), Cl:Ac ratio acetate maximized, AA 47 g/L, dextrose 15%, lipids 30 g/L, 1540 mL total volume  Add standard MVI, 100 mg thiamine, 1 mg folic acid and trace elements to TPN  continue Sensitive q6h SSI and adjust as needed   Repeat 2 grams IV calcium gluconate  2 grams IV magnesium sulfate x 1  Monitor TPN labs daily until stable due to high risk for refeeding syndrome  72m 05/17/2020,6:57 AM

## 2020-05-18 DIAGNOSIS — K7011 Alcoholic hepatitis with ascites: Secondary | ICD-10-CM

## 2020-05-18 DIAGNOSIS — R188 Other ascites: Secondary | ICD-10-CM

## 2020-05-18 LAB — MAGNESIUM: Magnesium: 2.3 mg/dL (ref 1.7–2.4)

## 2020-05-18 LAB — CBC
HCT: 20 % — ABNORMAL LOW (ref 39.0–52.0)
HCT: 21.7 % — ABNORMAL LOW (ref 39.0–52.0)
HCT: 28.6 % — ABNORMAL LOW (ref 39.0–52.0)
Hemoglobin: 7 g/dL — ABNORMAL LOW (ref 13.0–17.0)
Hemoglobin: 7.3 g/dL — ABNORMAL LOW (ref 13.0–17.0)
Hemoglobin: 9.8 g/dL — ABNORMAL LOW (ref 13.0–17.0)
MCH: 30.9 pg (ref 26.0–34.0)
MCH: 32.4 pg (ref 26.0–34.0)
MCH: 32.6 pg (ref 26.0–34.0)
MCHC: 33.6 g/dL (ref 30.0–36.0)
MCHC: 34.3 g/dL (ref 30.0–36.0)
MCHC: 35 g/dL (ref 30.0–36.0)
MCV: 90.2 fL (ref 80.0–100.0)
MCV: 93 fL (ref 80.0–100.0)
MCV: 96.4 fL (ref 80.0–100.0)
Platelets: 113 10*3/uL — ABNORMAL LOW (ref 150–400)
Platelets: 121 10*3/uL — ABNORMAL LOW (ref 150–400)
Platelets: 125 10*3/uL — ABNORMAL LOW (ref 150–400)
RBC: 2.15 MIL/uL — ABNORMAL LOW (ref 4.22–5.81)
RBC: 2.25 MIL/uL — ABNORMAL LOW (ref 4.22–5.81)
RBC: 3.17 MIL/uL — ABNORMAL LOW (ref 4.22–5.81)
RDW: 13.6 % (ref 11.5–15.5)
RDW: 13.6 % (ref 11.5–15.5)
RDW: 15.5 % (ref 11.5–15.5)
WBC: 19.2 10*3/uL — ABNORMAL HIGH (ref 4.0–10.5)
WBC: 27 10*3/uL — ABNORMAL HIGH (ref 4.0–10.5)
WBC: 28.8 10*3/uL — ABNORMAL HIGH (ref 4.0–10.5)
nRBC: 0 % (ref 0.0–0.2)
nRBC: 0.1 % (ref 0.0–0.2)
nRBC: 0.1 % (ref 0.0–0.2)

## 2020-05-18 LAB — LIPASE, BLOOD: Lipase: 171 U/L — ABNORMAL HIGH (ref 11–51)

## 2020-05-18 LAB — PROCALCITONIN: Procalcitonin: 0.84 ng/mL

## 2020-05-18 LAB — VITAMIN B1: Vitamin B1 (Thiamine): 272.2 nmol/L — ABNORMAL HIGH (ref 66.5–200.0)

## 2020-05-18 LAB — COMPREHENSIVE METABOLIC PANEL
ALT: 18 U/L (ref 0–44)
AST: 36 U/L (ref 15–41)
Albumin: 1.5 g/dL — ABNORMAL LOW (ref 3.5–5.0)
Alkaline Phosphatase: 86 U/L (ref 38–126)
Anion gap: 5 (ref 5–15)
BUN: 15 mg/dL (ref 6–20)
CO2: 26 mmol/L (ref 22–32)
Calcium: 6.8 mg/dL — ABNORMAL LOW (ref 8.9–10.3)
Chloride: 105 mmol/L (ref 98–111)
Creatinine, Ser: 0.81 mg/dL (ref 0.61–1.24)
GFR calc Af Amer: >60 mL/min (ref >60)
GFR calc non Af Amer: >60 mL/min (ref >60)
Glucose, Bld: 135 mg/dL — ABNORMAL HIGH (ref 70–99)
Potassium: 3.6 mmol/L (ref 3.5–5.1)
Sodium: 136 mmol/L (ref 135–145)
Total Bilirubin: 0.4 mg/dL (ref 0.3–1.2)
Total Protein: 4.1 g/dL — ABNORMAL LOW (ref 6.5–8.1)

## 2020-05-18 LAB — HEPARIN LEVEL (UNFRACTIONATED)
Heparin Unfractionated: 0.2 IU/mL — ABNORMAL LOW (ref 0.30–0.70)
Heparin Unfractionated: 0.3 IU/mL (ref 0.30–0.70)
Heparin Unfractionated: 0.28 IU/mL — ABNORMAL LOW (ref 0.30–0.70)

## 2020-05-18 LAB — GLUCOSE, CAPILLARY
Glucose-Capillary: 126 mg/dL — ABNORMAL HIGH (ref 70–99)
Glucose-Capillary: 126 mg/dL — ABNORMAL HIGH (ref 70–99)
Glucose-Capillary: 133 mg/dL — ABNORMAL HIGH (ref 70–99)
Glucose-Capillary: 156 mg/dL — ABNORMAL HIGH (ref 70–99)
Glucose-Capillary: 158 mg/dL — ABNORMAL HIGH (ref 70–99)

## 2020-05-18 LAB — PREPARE RBC (CROSSMATCH)

## 2020-05-18 LAB — T4, FREE: Free T4: 0.55 ng/dL — ABNORMAL LOW (ref 0.61–1.12)

## 2020-05-18 LAB — PHOSPHORUS: Phosphorus: 4 mg/dL (ref 2.5–4.6)

## 2020-05-18 MED ORDER — SODIUM CHLORIDE 0.9% IV SOLUTION: Freq: Once | INTRAVENOUS | Status: AC

## 2020-05-18 MED ORDER — TRAVASOL 10 % IV SOLN
INTRAVENOUS | Status: AC
  Filled 2020-05-18: qty 1040.4

## 2020-05-18 NOTE — Progress Notes (Signed)
Daily Progress Note   Patient Name: Shane Wiltgen Sr.       Date: 05/18/2020 DOB: 09/09/1986  Age: 34 y.o. MRN#: [id number] Attending Physician: Delfino Lovett, MD Primary Care Physician: [name], MD Admit Date: 05/03/2020  Reason for Consultation/Follow-up: Establishing goals of care  Subjective: Patient is resting in bed with fiance at bedside. Kathie Rhodes states she will go to the courthouse today to get a marriage license if her mother is able to come and give her a ride. Patient's WBC has improved. ID in during my visit. Plans to continue care.    Length of Stay: 15  Current Medications: Scheduled Meds:  . Chlorhexidine Gluconate Cloth  6 each Topical Daily  . Gerhardt's butt cream   Topical TID  . insulin aspart  0-9 Units Subcutaneous Q6H  . pantoprazole (PROTONIX) IV  40 mg Intravenous Q12H  . sodium chloride flush  3 mL Intravenous Q12H    Continuous Infusions: . sodium chloride Stopped (05/13/20 1628)  . heparin 1,550 Units/hr (05/18/20 1203)  . meropenem (MERREM) IV Stopped (05/18/20 0819)  . norepinephrine (LEVOPHED) Adult infusion 213.333 mcg/min (05/18/20 1203)  . TPN ADULT (ION) 60 mL/hr at 05/18/20 1203  . TPN ADULT (ION)      PRN Meds: sodium chloride, hydrALAZINE, HYDROmorphone (DILAUDID) injection, labetalol, loperamide, LORazepam, ondansetron **OR** ondansetron (ZOFRAN) IV  Physical Exam Pulmonary:     Effort: Pulmonary effort is normal.  Neurological:     Mental Status: He is alert.             Vital Signs: BP 117/79   Pulse 92   Temp (!) 97.4 F (36.3 C) (Oral)   Resp 20   Ht 5\' 7"  (1.702 m)   Wt 68.3 kg   SpO2 98%   BMI 23.58 kg/m  SpO2: SpO2: 98 % O2 Device: O2 Device: Room Air O2 Flow Rate:    Intake/output summary:   Intake/Output  Summary (Last 24 hours) at 05/18/2020 1459 Last data filed at 05/18/2020 1400 Gross per 24 hour  Intake 2789.66 ml  Output 2160 ml  Net 629.66 ml   LBM: Last BM Date: 05/18/20 Baseline Weight: Weight: 63.5 kg Most recent weight: Weight: 68.3 kg       Palliative Assessment/Data:      Patient Active Problem List   Diagnosis Date  Noted  . 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 Acute alcoholic pancreatitis 05/10/2020  . Acute metabolic encephalopathy 05/10/2020  . Severe protein-calorie malnutrition (HCC) 05/10/2020  . Sinus tachycardia 05/10/2020  . Alcohol withdrawal seizure with delirium (HCC) 05/03/2020  . Hyponatremia 05/03/2020  . Hypokalemia 05/03/2020  . AKI (acute kidney injury) (HCC) 05/03/2020  . Elevated CK 05/03/2020  . Heme positive stool 05/03/2020  . Thrombocytopenia (HCC) 05/03/2020    Palliative Care Assessment & Plan   Recommendations/Plan: Chaplain helping with marriage here. DNR/DNI, no feeding tube. Treat the treatable.  Recommend palliative at D/C.   Code Status:    Code Status Orders  (From admission, onward)         Start     Ordered   05/03/20 2153  Full code  Continuous        05/03/20 2155        Code Status History    Date Active Date Inactive Code Status Order ID Comments User Context   05/03/2020 2016 05/03/2020 2155 Full Code [id number]  Willy Eddy, MD ED   Advance Care Planning Activity      Prognosis: Poor overall    Care plan was discussed with RN, physicians  Thank you for allowing the Palliative Medicine Team to assist in the care of this patient.   Total Time 35 min Prolonged Time Billed  no      Greater than 50%  of this time was spent counseling and coordinating care related to the above assessment and plan.  Morton Stall, NP  Please contact Palliative Medicine Team phone at [phone] for questions and concerns.

## 2020-05-18 NOTE — Progress Notes (Signed)
ID Pt more alert Says he ate yogurt Feeling cold No pain  BP 124/79    Pulse 100    Temp (!) 97.3 F (36.3 C) (Oral)    Resp 20    Ht 5\' 7"  (1.702 m)    Wt 68.3 kg    SpO2 98%    BMI 23.58 kg/m   CBC Latest Ref Rng & Units 05/18/2020 05/18/2020 05/17/2020  WBC 4.0 - 10.5 K/uL 27.0(H) 28.8(H) 39.8(H)  Hemoglobin 13.0 - 17.0 g/dL 7.0(L) 7.3(L) 7.8(L)  Hematocrit 39 - 52 % 20.0(L) 21.7(L) 22.5(L)  Platelets 150 - 400 K/uL 121(L) 125(L) 144(L)  awake Emaciated Ill looking Chest b/l air entry Decreased bases [id number] abd some distension Legs edematous and rt foot I blister Some erythematous macular lesions         CMP Latest Ref Rng & Units 05/18/2020 05/17/2020 05/16/2020  Glucose 70 - 99 mg/dL 07/17/2020) 601(U) 932(T)  BUN 6 - 20 mg/dL 15 557(D) 22(G)  Creatinine 0.61 - 1.24 mg/dL 25(K 2.70 6.23)  Sodium 135 - 145 mmol/L 136 139 137  Potassium 3.5 - 5.1 mmol/L 3.6 3.6 3.9  Chloride 98 - 111 mmol/L 105 106 102  CO2 22 - 32 mmol/L 26 19(L) 22  Calcium 8.9 - 10.3 mg/dL 7.62(G) 5.5(LL) 5.3(LL)  Total Protein 6.5 - 8.1 g/dL 4.1(L) 4.3(L) 4.9(L)  Total Bilirubin 0.3 - 1.2 mg/dL 0.4 0.6 0.8  Alkaline Phos 38 - 126 U/L 86 99 71  AST 15 - 41 U/L 36 43(H) 36  ALT 0 - 44 U/L 18 20 21     Impression/Recommendation ? ?Complicated medical history.  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  Concern for infected pseudocyst especially with increasing leukocytosis.  Patient is currently on meropenem and leucocytosis improving  Pancreatic ascites with ascitic fluid having very high amylase and lipase.  This is secondary to the pancreatitis or could he have a pancreatic duct fistula?  leukocytosis trending down - continue meropenem If leucocytosis worsens then aspiration of the pseudocyst is  needed   Severe anemia a drop of hemoglobin from 15-7.8 raises concern for a GI bleed or hemosuccus pancreaticus    Severe leukocytosis.improving  Diarrhea secondary to acute pancreatitis/GI bleed.  C. difficile is negative.  Severe malnutrition. Check for vitamin C and other nutrients and minerals ( Zn etc)  level as he has lesions on his legs which could be deficiency   Discussed with fiance care team in great detail  ID will follow him peripherally this weekend-call on call ID if needed

## 2020-05-18 NOTE — Consult Note (Signed)
ANTICOAGULATION CONSULT NOTE  Pharmacy Consult for Heparin infusion  Indication: cerebral venous thrombosis  Patient Measurements: Height: 5\' 7"  (170.2 cm) Weight: 68.3 kg (150 lb 9.2 oz) IBW/kg (Calculated) : 66.1  Vital Signs: Temp: 97.7 F (36.5 C) (07/08 1900) BP: 101/54 (07/08 1900) Pulse Rate: 96 (07/08 1900)  Labs: Recent Labs    05/15/20 2006 05/15/20 2200 05/16/20 0331 05/16/20 0802 05/16/20 1230 05/16/20 1812 05/17/20 0020 05/17/20 0535 05/17/20 0718 05/17/20 1611 05/18/20 0032  HGB 9.1*   < > 7.9*  --  8.1*  --   --  7.8*  --   --  7.3*  HCT 26.4*   < > 22.6*  --  23.8*  --   --  22.5*  --   --  21.7*  PLT 126*   < > 151  --   --   --   --  144*  --   --  125*  HEPARINUNFRC  --    < >  --    < >  --    < >   < >  --  0.29* 0.12* 0.20*  CREATININE 1.31*  --  1.42*  --   --   --   --  1.18  --   --   --   TROPONINIHS  --   --  16  --   --   --   --   --   --   --   --    < > = values in this interval not displayed.    Estimated Creatinine Clearance: 82.5 mL/min (by C-G formula based on SCr of 1.18 mg/dL).   Medical History: Past Medical History:  Diagnosis Date  . Alcohol use disorder, severe, dependence (HCC)    Assessment: Pharmacy consulted for heparin infusion dosing and monitoring for 34 yo male for cerebral venous thrombosis.   CT Head: "Confirmed thrombus within the confluence of sinuses and left transverse dural venous sinus. 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 No evidence of hemorrhagic conversion." H&H continue to trend down, platelets have normalized  07/05  0040 HL 0.20 subtherapeutic - rate increased to 1150 units/hr  07/05  1004 HL 0.70 supratherapeutic - rate decreased to 1000 units/hr 07/05 1804 HL < 0.10: inc to 1150 units/hr 07/06  0205 HL 0.32: no change 07/06  0757 HL 0.54: decreased to 900 units/hr 07/06  2200 HL 0.20: increased to 1100 units/hr 07/07  0802 HL 0.23: increased to 1150 units/hr 07/07   1812 HL 0.31: no change 07/08  0020 HL 0.32: no change 07/08  0718 HL 0.29: increased to 1200 units/hr 07/08  1611 HL 0.12 increase to 1350 units/hr   07/09 0032 HL 0.20, subtherapeutic.  CBC worse.  Goal of Therapy:  Heparin level 0.3-0.5 units/ml Monitor platelets by anticoagulation protocol: Yes   Plan:   Heparin level subtherapeutic: increase rate very slightly (based on previous response) to 1450 units/hr   recheck heparin level in 6 hours after rate change  F/U CBC in am  09/09, PharmD Clinical Pharmacist 05/18/2020 1:25 AM

## 2020-05-18 NOTE — Progress Notes (Signed)
PROGRESS NOTE    Shane Peterka Sr.   YIF:[id number]  DOB: 06/01/1986  PCP: Lorelee Market, MD    DOA: 05/03/2020 LOS: 73   Brief Narrative   34 year old gentleman with no medical history, chronic alcoholism, has been drinking nonstop about 12 packs of beer daily since age of 63 presented to the ED on  05/03/2020 for evaluation of progressive confusion and lethargy.  He also had episode of unresponsiveness in the triage area.  As per patient's fianc, patient stopped drinking about 24 hours prior to arrival and started behaving altered, confused.  Also reported multiple episodes diarrhea, but no nausea or vomiting.    In the ED, he was ill-appearing, BP stable, tachycardic with heart rate 141, respirations 21, and afebrile.  Labs were notable for sodium 124, potassium was 2.8, creatinine 1.75, bilirubin 2.  Anion gap 19.  CK 700.  Chest x-ray was essentially normal.  CT head was normal.  CT abdomen pelvis was consistent with pancreatitis with peripancreatic edema and multiple lesions that are most likely pseudocysts, moderate ascites with mild fatty  infiltration of liver, small BILATERAL pleural effusions and minimal bibasilar atelectasis (other non-pertinent findings in report).   Admitted to the hospitalist service for further evaluation and management on acute alcohol withdrawal syndrome, acute pancreatitis and possible SBP.     Assessment & Plan   Principal Problem:   Acute alcoholic pancreatitis Active Problems:   Alcohol withdrawal seizure with delirium (HCC)   Hyponatremia   Hypokalemia   AKI (acute kidney injury) ([place])   Elevated CK   Heme positive stool   Thrombocytopenia (HCC)   Acute metabolic encephalopathy   Severe protein-calorie malnutrition (HCC)   Sinus tachycardia   Ascites due to alcoholic hepatitis  Septic shock with hypothermia and hypotension - now in stepdown/ICU still requiring Levophed (wean off as able), transferred overnight 7/6-7.  PCCM following.   Maintain MAP >65.  Further mgmt as below.  Acute alcoholic pancreatitis -present on admission with abdominal pain, tachycardia, diarrhea, hypotension.  No gallbladder abnormalities on CT abdomen.  No longer complains of abdominal pain, only distention.   Worsening leukocytosis 20 >> 26 >> 40k>27 concerning for infected necrosis.  Lipase continues to improve (912 -> 337).  Continue meropenem.  Monitor abdominal exam closely.  Continue supportive care.     Severe Protein Calorie Malnutrition - POA, due to chronic alcohol abuse and not eating.  Minimal PO intake during admission.  Dietician following.  Family opted no tube feeding (per palliative discussion on 7/8).  TPN started on 7/8.  Daily CMP, Mg, Phos.  High risk of refeeding syndrome.  Overall difficult situation.  Goals of care - had discussion 7/5 with wife and her mother at bedside regarding unclear prognosis at this time.  They agree to palliative consult.  Do want to pursue artificial feeding.  Otherwise continue full scope of care at this time.   7/8: Palliative care met with Fiance, Fiance's mother as well as friend Carloyn Manner on 7/8. Patient is not quite ready for full comfort care and would like to treat the treatable.  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  Neurology following.  Heparin infusion -may switch to Eliquis per neuro.  Closely monitor for bleeding, CBC's.  Neuro-checks.  Repeat head CT 7/5 ruled out hemorrhage or hydrocephalus.  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  ASA and statin started.  Neurology following.  Repeat CT 7/5 as above.  Generalized weakness - severe.  Due to chronic alcoholism and malnutrition as well as prolonged acute illness.  He is requiring heavy assistance with  bed mobility and standing.   Initial PT recommendation is SNF.  TOC following.  If he survives this admission, expect CIR will be needed.  Acute metabolic encephalopathy - waxes and wanes, having hallucinations, agitation at times.  Lethargy has resolved.  No longer receiving benzos for alcohol withdrawal.  CT head upon admission was negative for acute findings that showed generalized atrophy advanced for his age.  Exam is non-focal.  Ammonia level normal x 2.  B12 normal.  UDS on admission was never done, please collect and process.  Neurology consulted, appreciate input.  EEG consistent with encephalopathy, no epileptiform activity.   Mild TSH elevation but normal T4.  Repeat head CT is neg   Hematuria - new 7/3, in addition to pt reporting dysuria.  UA negative.  Appears resolved at this time.  Diarrhea - present on admission, persistent.  No fevers and leukocytosis likely due to pancreatitis, very unlikely infectious. Suspect malabsorption due to pancreatitis.  FMS in place.  Leukocytosis - worsening.  Likely due to pancreatitis, no other evidence of infection.  UA negative.  No respiratory symptoms.  Monitor closely.  On empiric Zosyn (started 7/4) given persistent high wbc, concern for developing pancreatic pseudocyst infection or SBP switched to meropenem on 7/7.  Follow blood cultures, no growth so far. C/s ID for Abx mgmt and need for c.diff. GI panel neg  Sinus tachycardia - resolved after starting empiric antibiotics.  Had been persistently elevated HR despite being past alcohol withdrawal and pain better from pancreatitis.  Ascites fluid cultures negative to date. Blood cultures negative to date.  No respiratory or urinary symptoms.  7/6 - worsening leukocytosis, concern for infected necrosis on pancreas.  On Meropenem. ID c/s  Ascites - his liver has mild fatty infiltration on imaging, not cirrhotic at this point, this is likely due to pancreatitis and hypoalbuminemia.  Underwent  paracentesis on 6/28 with 2.8 L fluid removed. Ordered repeat paracentesis (7/1) due to increased distention, but not enough fluid seen on ultrasound to do safely.  Large volume ascites seen on MRI abdomen (7/3), Continue Lasix and spironolactone.  Repeat paracentesis on 7/7 with 3 L of dark serous fluid removed.  Continue empiric meropenem.  Elevated TSH - free T4 normal.    Chest pain - Resolved.  Reported by pt on AM of 7/1.  Troponin mildly elevated at 32, likely demand ischemia from persistent tachycardia in setting of alcohol withdrawal.  No acute ischemic changes on EKG, only non-specific T wave changes.  Monitor.  Hypervolemic hyponatremia -POA with sodium 124.  Resolved.  Suspect due to liver disease.  Monitor BMP.  Improving with diuretics.    Acute renal failure -Resolved.  Present on admission with creatinine 1.75.  Likely prerenal azotemia in the setting of above.  Improved with IV hydration.  Monitor BMP and urine output.  Hypokalemia -present on admission, replaced.  Monitor BMP, Mg, Phos and replace as needed.  Acute anemia: Likely blood loss, unable to get any luminal evaluation due to his critical illness We will manage conservatively with 2 units of packed red blood cell transfusion Please note patient's admission hemoglobin was 15.3 which is dropped to 7.0 this morning  Case discussed with GI who agrees with conservative management for now  Patient BMI: Body mass index is 23.58 kg/m.   DVT prophylaxis: SCDs Start: 05/03/20 2153   Diet:  Diet Orders (From admission, onward)    Start     Ordered   05/15/20 1035  Diet regular Room service appropriate? Yes; Fluid consistency: Thin  Diet effective now       Comments: Please give 3 milk cartons with each meal.  Question Answer Comment  Room service appropriate? Yes   Fluid consistency: Thin      05/15/20 1035            Code Status: DNR    Subjective 05/18/20   Alert, trying to eat yogurt, assisted by his  fiance at bedside for feeding.  No complaints Disposition Plan & Communication   Status is: Inpatient  Remains inpatient appropriate because:Inpatient level of care appropriate due to severity of illness   Dispo: The patient is from: Home              Anticipated d/c is to: SNF              Anticipated d/c date is: > 3 days                Patient currently is not medically stable to d/c.   Family Communication: wife at bedside during encounter    Consults, Procedures, Significant Events   Consultants:   Neurology  Gastroenterology  General surgery  Palliative care  PCCM   Procedures:   Paracentesis 05/07/2020, 05/16/2020  Antimicrobials:   Meropenem (6/28 to 7/1)  Zosyn 7/4 >>    Objective   Vitals:   05/18/20 1415 05/18/20 1430 05/18/20 1500 05/18/20 1600  BP: 117/79 120/79 107/66 120/78  Pulse: 92 98 90 88  Resp: '20 19 19 ' (!) 22  Temp:  (!) 97.5 F (36.4 C) (!) 97.5 F (36.4 C) (!) 97.5 F (36.4 C)  TempSrc:  Oral Oral Oral  SpO2: 98% 98% 98% 97%  Weight:      Height:        Intake/Output Summary (Last 24 hours) at 05/18/2020 1729 Last data filed at 05/18/2020 1600 Gross per 24 hour  Intake 4044.79 ml  Output 2160 ml  Net 1884.79 ml   Filed Weights   05/03/20 2232 05/16/20 0030  Weight: 63.5 kg 68.3 kg    Physical Exam:  General exam: awake, alert, no acute distress, underweight, chronically ill appearing HEENT: very dry mucus membranes, hearing grossly normal, pale lips  Respiratory system: shallow inspirations, CTAB, normal respiratory effort, no wheezes or rhonchi. Cardiovascular system: normal S1/S2, RRR, 2+ LE edema worse in the feet.   Gastrointestinal system: tensely distended but not tender, active bowel sounds, FMS in place with light brown liquid stool in bag. Central nervous system: Alert, oriented x 3, very soft vocal projection. No gross focal neurologic deficits.   Extremities: edematous feet, no cyanosis, moves all  extremities Skin: dry, intact, normal temperature, pale Psychiatric: normal mood with periods of anxiety, congruent affect  Labs   Data Reviewed: I have personally reviewed following labs and imaging studies  CBC: Recent Labs  Lab 05/12/20 0557 05/12/20 0557 05/13/20 0618 05/14/20 0040 05/15/20 2006 05/15/20 2006 05/16/20 0331 05/16/20 1230 05/17/20 0535 05/18/20 0032 05/18/20 0517  WBC 19.4*   < > 20.4*   < > 34.2*  --  40.4*  --  39.8* 28.8* 27.0*  NEUTROABS 16.4*  --  17.5*  --  31.7*  --   --   --   --   --   --   HGB 10.4*   < > 10.5*   < > 9.1*   < > 7.9* 8.1* 7.8* 7.3* 7.0*  HCT 29.5*   < > 30.3*   < > 26.4*   < > 22.6* 23.8* 22.5* 21.7* 20.0*  MCV 92.5   < > 94.4   < > 95.0  --  95.4  --  94.9 96.4 93.0  PLT 239   < > 246   < > 126*  --  151  --  144* 125* 121*   < > = values in this interval not displayed.   Basic Metabolic Panel: Recent Labs  Lab 05/13/20 0618 05/13/20 0618 05/14/20 0040 05/14/20 0040 05/15/20 0205 05/15/20 2006 05/16/20 0331 05/17/20 0535 05/18/20 0517  NA 135   < > 135   < > 136 137 137 139 136  K 3.8   < > 3.4*   < > 3.8 3.6 3.9 3.6 3.6  CL 101   < > 99   < > 103 103 102 106 105  CO2 26   < > 28   < > '24 25 22 ' 19* 26  GLUCOSE 113*   < > 141*   < > 132* 147* 126* 139* 135*  BUN 16   < > 17   < > 20 26* 29* 24* 15  CREATININE 0.56*   < > 0.66   < > 0.78 1.31* 1.42* 1.18 0.81  CALCIUM 7.9*   < > 7.3*   < > 6.1* 5.2* 5.3* 5.5* 6.8*  MG 1.7   < > 2.0  --  1.8  --  1.9 1.7 2.3  PHOS 4.3  --   --   --  3.7  --  4.7* 4.9* 4.0   < > = values in this interval not displayed.   GFR: Estimated Creatinine Clearance: 120.1 mL/min (by C-G formula based on SCr of 0.81 mg/dL). Liver Function Tests: Recent Labs  Lab 05/15/20 0205 05/15/20 2006 05/16/20 0331 05/17/20 0535 05/18/20 0517  AST 39 39 36 43* 36  ALT '25 24 21 20 18  ' ALKPHOS 81 79 71 99 86  BILITOT 0.7 0.7 0.8 0.6 0.4  PROT 4.4* 4.1* 4.9* 4.3* 4.1*  ALBUMIN 1.5* 1.4* 2.8* 1.8*  1.5*   Recent Labs  Lab 05/13/20 0618 05/14/20 0040 05/15/20 0205 05/16/20 0331 05/17/20 0535  LIPASE 1,543* 1,510* 1,141* 912* 337*   Recent Labs  Lab 05/12/20 1117 05/15/20 2006  AMMONIA <9* 17   Coagulation Profile: Recent Labs  Lab 05/13/20 0910 05/14/20 0040  INR 1.1 1.1   Cardiac Enzymes: No results for input(s): CKTOTAL, CKMB, CKMBINDEX, TROPONINI in the last 168 hours. BNP (last 3 results) No results for input(s): PROBNP in the last 8760 hours. HbA1C: No results for input(s): HGBA1C in the last 72 hours. CBG: Recent Labs  Lab 05/17/20 1141 05/17/20 1609 05/18/20 0041 05/18/20 0522 05/18/20 1138  GLUCAP 146* 159* 158* 133* 126*   Lipid Profile: Recent Labs    05/16/20 1230  TRIG 45   Thyroid Function Tests: Recent Labs    05/15/20 2029 05/18/20 0517  TSH 11.634*  --   FREET4  --  0.55*   Anemia Panel: No results for input(s): VITAMINB12, FOLATE, FERRITIN, TIBC, IRON, RETICCTPCT in the last 72 hours. Sepsis Labs: Recent Labs  Lab 05/15/20 2223 05/16/20 0327 05/17/20 0535 05/18/20 0517  PROCALCITON  --  1.84 1.37 0.84  LATICACIDVEN 2.0* 1.1  --   --     Recent Results (from the past 240 hour(s))  Culture, blood (single) w Reflex to ID Panel     Status: None   Collection Time: 05/11/20  7:59 AM   Specimen: BLOOD  Result Value Ref Range Status   Specimen Description BLOOD LEFT WRIST  Final   Special Requests   Final    BOTTLES DRAWN AEROBIC AND ANAEROBIC Blood Culture adequate volume   Culture   Final    NO GROWTH 5 DAYS Performed at [facility], [address] [place], [place] [address]    Report Status 05/16/2020 FINAL  Final  CULTURE, BLOOD (ROUTINE X 2) w Reflex to ID Panel     Status: None (Preliminary result)   Collection Time: 05/15/20 11:52 PM   Specimen: Left Antecubital; Blood  Result Value Ref Range Status   Specimen Description LEFT ANTECUBITAL  Final   Special Requests   Final    BOTTLES DRAWN AEROBIC  AND ANAEROBIC Blood Culture adequate volume   Culture   Final    NO GROWTH 2 DAYS Performed at [facility], [address] [place], [place] [address]    Report Status PENDING  Incomplete  CULTURE, BLOOD (ROUTINE X 2) w Reflex to ID Panel     Status: None (Preliminary result)   Collection Time: 05/15/20 11:53 PM   Specimen: BLOOD LEFT HAND  Result Value Ref Range Status   Specimen Description BLOOD LEFT HAND  Final   Special Requests   Final    BOTTLES DRAWN AEROBIC AND ANAEROBIC Blood Culture adequate volume   Culture   Final    NO GROWTH 2 DAYS Performed at [facility], [address], [place], [place] [address]    Report Status PENDING  Incomplete  MRSA PCR Screening     Status: None   Collection Time: 05/16/20 12:24 AM   Specimen: Nasopharyngeal  Result Value Ref Range Status   MRSA by PCR NEGATIVE NEGATIVE Final    Comment:        The GeneXpert MRSA Assay (FDA approved for NASAL specimens only), is one component of a comprehensive MRSA colonization surveillance program. It is not intended to diagnose MRSA infection nor to guide or monitor treatment for MRSA infections. Performed at [facility], [address], [place], [place] [address]   Urine Culture     Status: None   Collection Time: 05/16/20  3:27 AM   Specimen: Urine, Random  Result Value Ref Range Status   Specimen Description   Final    URINE, RANDOM Performed at [facility], [address] [place], [place] [address]    Special Requests   Final    NONE Performed at [facility], [address] [place], [place] [address]    Culture   Final    NO GROWTH Performed at [facility], [place] [address] [place], [place] [address]    Report Status 05/17/2020 FINAL  Final  Gastrointestinal Panel by PCR , Stool     Status: None   Collection Time: 05/17/20  3:06 PM   Specimen: Stool  Result Value Ref Range Status   Campylobacter species NOT DETECTED  NOT DETECTED Final   Plesimonas shigelloides NOT DETECTED NOT DETECTED Final   Salmonella species NOT DETECTED NOT DETECTED Final   Yersinia enterocolitica NOT DETECTED NOT DETECTED Final   Vibrio species NOT DETECTED NOT DETECTED Final  Vibrio cholerae NOT DETECTED NOT DETECTED Final   Enteroaggregative E coli (EAEC) NOT DETECTED NOT DETECTED Final   Enteropathogenic E coli (EPEC) NOT DETECTED NOT DETECTED Final   Enterotoxigenic E coli (ETEC) NOT DETECTED NOT DETECTED Final   Shiga like toxin producing E coli (STEC) NOT DETECTED NOT DETECTED Final   Shigella/Enteroinvasive E coli (EIEC) NOT DETECTED NOT DETECTED Final   Cryptosporidium NOT DETECTED NOT DETECTED Final   Cyclospora cayetanensis NOT DETECTED NOT DETECTED Final   Entamoeba histolytica NOT DETECTED NOT DETECTED Final   Giardia lamblia NOT DETECTED NOT DETECTED Final   Adenovirus F40/41 NOT DETECTED NOT DETECTED Final   Astrovirus NOT DETECTED NOT DETECTED Final   Norovirus GI/GII NOT DETECTED NOT DETECTED Final   Rotavirus A NOT DETECTED NOT DETECTED Final   Sapovirus (I, II, IV, and V) NOT DETECTED NOT DETECTED Final    Comment: Performed at [facility], [address] [place], [place] [address]  C Difficile Quick Screen (NO PCR Reflex)     Status: None   Collection Time: 05/17/20  3:06 PM   Specimen: STOOL  Result Value Ref Range Status   C Diff antigen NEGATIVE NEGATIVE Final   C Diff toxin NEGATIVE NEGATIVE Final   C Diff interpretation No C. difficile detected.  Final    Comment: Performed at [facility], [address] [place], [place] [address]      Imaging Studies   ECHOCARDIOGRAM COMPLETE  Result Date: 05/17/2020    ECHOCARDIOGRAM REPORT   Patient Name:   Shane Grau Sr. Date of Exam: 05/16/2020 Medical Rec #:  [id number]       Height:       67.0 in Accession #:    [id number]      Weight:       150.6 lb Date of Birth:  02-03-1986        BSA:          1.792 m Patient Age:    34 years         BP:           94/63 mmHg Patient Gender: M               HR:           91 bpm. Exam Location:  [facility] Procedure: 2D Echo, Cardiac Doppler and Color Doppler Indications:     Shock  History:         Patient has no prior history of Echocardiogram examinations.                  Risk Factors:Alochol Abuse.  Sonographer:     Wilford Sports Rodgers-Bishop Referring Phys:  [id number] Bradly Bienenstock Diagnosing Phys: Ida Rogue MD IMPRESSIONS  1. Left ventricular ejection fraction, by estimation, is 60 to 65%. The left ventricle has normal function. The left ventricle has no regional wall motion abnormalities. Left ventricular diastolic parameters were normal.  2. Right ventricular systolic function is normal. The right ventricular size is normal.  3. The mitral valve is normal in structure. No evidence of mitral valve regurgitation. No evidence of mitral stenosis.  4. The aortic valve is normal in structure. Aortic valve regurgitation is not visualized. No aortic stenosis is present.  5. The inferior vena cava is normal in size with greater than 50% respiratory variability, suggesting right atrial pressure of 3 mmHg. FINDINGS  Left Ventricle: Left ventricular ejection fraction, by estimation, is 60 to 65%. The left ventricle has normal  function. The left ventricle has no regional wall motion abnormalities. The left ventricular internal cavity size was normal in size. There is  no left ventricular hypertrophy. Left ventricular diastolic parameters were normal. Right Ventricle: The right ventricular size is normal. No increase in right ventricular wall thickness. Right ventricular systolic function is normal. Left Atrium: Left atrial size was normal in size. Right Atrium: Right atrial size was normal in size. Pericardium: There is no evidence of pericardial effusion. Mitral Valve: The mitral valve is normal in structure. Normal mobility of the mitral valve leaflets. No evidence of mitral valve regurgitation. No evidence of mitral  valve stenosis. Tricuspid Valve: The tricuspid valve is normal in structure. Tricuspid valve regurgitation is not demonstrated. No evidence of tricuspid stenosis. Aortic Valve: The aortic valve is normal in structure. Aortic valve regurgitation is not visualized. No aortic stenosis is present. Pulmonic Valve: The pulmonic valve was normal in structure. Pulmonic valve regurgitation is not visualized. No evidence of pulmonic stenosis. Aorta: The aortic root is normal in size and structure. Venous: The inferior vena cava is normal in size with greater than 50% respiratory variability, suggesting right atrial pressure of 3 mmHg. IAS/Shunts: No atrial level shunt detected by color flow Doppler.  LEFT VENTRICLE PLAX 2D LVIDd:         4.34 cm  Diastology LVIDs:         2.77 cm  LV e' lateral:   10.30 cm/s LV PW:         0.97 cm  LV E/e' lateral: 10.0 LV IVS:        0.87 cm  LV e' medial:    9.79 cm/s LVOT diam:     2.10 cm  LV E/e' medial:  10.5 LV SV:         59 LV SV Index:   33 LVOT Area:     3.46 cm  RIGHT VENTRICLE             IVC RV Basal diam:  3.44 cm     IVC diam: 1.92 cm RV S prime:     16.90 cm/s TAPSE (M-mode): 2.5 cm LEFT ATRIUM             Index       RIGHT ATRIUM          Index LA diam:        3.60 cm 2.01 cm/m  RA Area:     9.62 cm LA Vol (A2C):   40.9 ml 22.82 ml/m RA Volume:   21.10 ml 11.77 ml/m LA Vol (A4C):   29.0 ml 16.18 ml/m LA Biplane Vol: 34.8 ml 19.42 ml/m  AORTIC VALVE LVOT Vmax:   88.60 cm/s LVOT Vmean:  61.900 cm/s LVOT VTI:    0.169 m  AORTA Ao Root diam: 3.50 cm MITRAL VALVE MV Area (PHT): 2.39 cm     SHUNTS MV Decel Time: 317 msec     Systemic VTI:  0.17 m MV E velocity: 103.00 cm/s  Systemic Diam: 2.10 cm MV A velocity: 107.00 cm/s MV E/A ratio:  0.96 Ida Rogue MD Electronically signed by Ida Rogue MD Signature Date/Time: 05/17/2020/7:45:35 AM    Final      Medications   Scheduled Meds: . Chlorhexidine Gluconate Cloth  6 each Topical Daily  . Gerhardt's butt cream    Topical TID  . insulin aspart  0-9 Units Subcutaneous Q6H  . pantoprazole (PROTONIX) IV  40 mg Intravenous Q12H  . sodium chloride flush  3 mL Intravenous Q12H   Continuous Infusions: . sodium chloride Stopped (05/13/20 1628)  . heparin 1,550 Units/hr (05/18/20 1645)  . meropenem (MERREM) IV 1 g (05/18/20 1647)  . norepinephrine (LEVOPHED) Adult infusion Stopped (05/18/20 1526)  . TPN ADULT (ION) 60 mL/hr at 05/18/20 1600  . TPN ADULT (ION)         LOS: 15 days    Time spent: 20 minutes with > 50% spent in coordination of care and direct patient contact.   Judia Arnott Manuella Ghazi, DO [facility]  05/18/2020, 5:29 PM    If 7PM-7AM, please contact night-coverage. How to contact the [facility] Attending or Consulting provider [place] or covering provider during after hours [place], for this patient?    1. Check the care team in [facility] and look for a) attending/consulting [facility] provider listed and b) the [facility] team listed 2. Log into [email or web address] and use [place]'s universal password to access. If you do not have the password, please contact the hospital operator. 3. Locate the [facility] provider you are looking for under [facility] and page to a number that you can be directly reached. 4. If you still have difficulty reaching the provider, please page the [facility] (Director on Call) for the Hospitalists listed on amion for assistance.

## 2020-05-18 NOTE — Progress Notes (Signed)
CH checked in w/pt. and fiance while rounding on ICU; pt. requested additional hot blanket --> felt cold while receiving blood transfusion fiance said.  Fiance said she is looking into transportation options for her getting [place] documents to get married; will contact chaplains if they need assistance w/this.  No further needs expressed at this time.    05/18/20 1200  Clinical Encounter Type  Visited With Patient and family together  Visit Type Follow-up;Psychological support;Spiritual support;Social support;Critical Care  Spiritual Encounters  Spiritual Needs Emotional

## 2020-05-18 NOTE — Progress Notes (Signed)
Subjective: Patient much improved today.  Awake and alert.    Objective: Current vital signs: BP (!) 109/59   Pulse 100   Temp 99.5 F (37.5 C) (Rectal)   Resp (!) 21   Ht 5\' 7"  (1.702 m)   Wt 68.3 kg   SpO2 97%   BMI 23.58 kg/m  Vital signs in last 24 hours: Temp:  [96.6 F (35.9 C)-99.7 F (37.6 C)] 99.5 F (37.5 C) (07/09 0900) Pulse Rate:  [81-101] 100 (07/09 0900) Resp:  [17-25] 21 (07/09 0900) BP: (92-109)/(54-68) 109/59 (07/09 0900) SpO2:  [96 %-100 %] 97 % (07/09 0900)  Intake/Output from previous day: 07/08 0701 - 07/09 0700 In: 1690.9 [I.V.:1340.9; IV Piggyback:350] Out: 2260 [Urine:960; Stool:1300] Intake/Output this shift: Total I/O In: 527.5 [I.V.:294; IV Piggyback:233.5] Out: 0  Nutritional status:  Diet Order            Diet regular Room service appropriate? Yes; Fluid consistency: Thin  Diet effective now                 Neurologic Exam: Mental Status: Alert and alert.  Speech dysarthric.  Oriented to name, place and year.  Follows simple commands.   Cranial Nerves: II: Visual fields grossly normal, pupils equal, round, reactive to light and accommodation III,IV, VI: ptosis not present, extra-ocular motions intact bilaterally V,VII: smile symmetric, facial light touch sensation normal bilaterally VIII: hearing normal bilaterally IX,X: gag reflex present XI: bilateral shoulder shrug XII: midline tongue extension Motor: Lifts all extremities against gravity.   Sensory: Pinprick and light touch intact throughout, bilaterally  Lab Results: Basic Metabolic Panel: Recent Labs  Lab 05/13/20 0618 05/13/20 0618 05/14/20 0040 05/14/20 0040 05/15/20 0205 05/15/20 0205 05/15/20 2006 05/15/20 2006 05/16/20 0331 05/17/20 0535 05/18/20 0517  NA 135   < > 135   < > 136  --  137  --  137 139 136  K 3.8   < > 3.4*   < > 3.8  --  3.6  --  3.9 3.6 3.6  CL 101   < > 99   < > 103  --  103  --  102 106 105  CO2 26   < > 28   < > 24  --  25  --  22  19* 26  GLUCOSE 113*   < > 141*   < > 132*  --  147*  --  126* 139* 135*  BUN 16   < > 17   < > 20  --  26*  --  29* 24* 15  CREATININE 0.56*   < > 0.66   < > 0.78  --  1.31*  --  1.42* 1.18 0.81  CALCIUM 7.9*   < > 7.3*   < > 6.1*   < > 5.2*   < > 5.3* 5.5* 6.8*  MG 1.7   < > 2.0  --  1.8  --   --   --  1.9 1.7 2.3  PHOS 4.3  --   --   --  3.7  --   --   --  4.7* 4.9* 4.0   < > = values in this interval not displayed.    Liver Function Tests: Recent Labs  Lab 05/15/20 0205 05/15/20 2006 05/16/20 0331 05/17/20 0535 05/18/20 0517  AST 39 39 36 43* 36  ALT 25 24 21 20 18   ALKPHOS 81 79 71 99 86  BILITOT 0.7 0.7 0.8 0.6 0.4  PROT  4.4* 4.1* 4.9* 4.3* 4.1*  ALBUMIN 1.5* 1.4* 2.8* 1.8* 1.5*   Recent Labs  Lab 05/13/20 0618 05/14/20 0040 05/15/20 0205 05/16/20 0331 05/17/20 0535  LIPASE 1,543* 1,510* 1,141* 912* 337*   Recent Labs  Lab 05/12/20 1117 05/15/20 2006  AMMONIA <9* 17    CBC: Recent Labs  Lab 05/12/20 0557 05/12/20 0557 05/13/20 0618 05/14/20 0040 05/15/20 2006 05/15/20 2006 05/16/20 0331 05/16/20 1230 05/17/20 0535 05/18/20 0032 05/18/20 0517  WBC 19.4*   < > 20.4*   < > 34.2*  --  40.4*  --  39.8* 28.8* 27.0*  NEUTROABS 16.4*  --  17.5*  --  31.7*  --   --   --   --   --   --   HGB 10.4*   < > 10.5*   < > 9.1*   < > 7.9* 8.1* 7.8* 7.3* 7.0*  HCT 29.5*   < > 30.3*   < > 26.4*   < > 22.6* 23.8* 22.5* 21.7* 20.0*  MCV 92.5   < > 94.4   < > 95.0  --  95.4  --  94.9 96.4 93.0  PLT 239   < > 246   < > 126*  --  151  --  144* 125* 121*   < > = values in this interval not displayed.    Cardiac Enzymes: No results for input(s): CKTOTAL, CKMB, CKMBINDEX, TROPONINI in the last 168 hours.  Lipid Panel: Recent Labs  Lab 05/16/20 1230  TRIG 45    CBG: Recent Labs  Lab 05/17/20 0712 05/17/20 1141 05/17/20 1609 05/18/20 0041 05/18/20 0522  GLUCAP 146* 146* 159* 158* 133*    Microbiology: Results for orders placed or performed during the  hospital encounter of 05/03/20  SARS Coronavirus 2 by RT PCR (hospital order, performed in [facility] hospital lab) Nasopharyngeal Nasopharyngeal Swab     Status: None   Collection Time: 05/03/20  8:25 PM   Specimen: Nasopharyngeal Swab  Result Value Ref Range Status   SARS Coronavirus 2 NEGATIVE NEGATIVE Final    Comment: (NOTE) SARS-CoV-2 target nucleic acids are NOT DETECTED.  The SARS-CoV-2 RNA is generally detectable in upper and lower respiratory specimens during the acute phase of infection. The lowest concentration of SARS-CoV-2 viral copies this assay can detect is 250 copies / mL. A negative result does not preclude SARS-CoV-2 infection and should not be used as the sole basis for treatment or other patient management decisions.  A negative result may occur with improper specimen collection / handling, submission of specimen other than nasopharyngeal swab, presence of viral mutation(s) within the areas targeted by this assay, and inadequate number of viral copies (<250 copies / mL). A negative result must be combined with clinical observations, patient history, and epidemiological information.  Fact Sheet for Patients:   BoilerBrush.com.cy  Fact Sheet for Healthcare Providers: https://pope.com/  This test is not yet approved or  cleared by the [place] FDA and has been authorized for detection and/or diagnosis of SARS-CoV-2 by FDA under an Emergency Use Authorization (EUA).  This EUA will remain in effect (meaning this test can be used) for the duration of the COVID-19 declaration under Section 564(b)(1) of the Act, 21 U.S.C. section 360bbb-3(b)(1), unless the authorization is terminated or revoked sooner.  Performed at [facility], [address], [place], [place] [address]   MRSA PCR Screening     Status: None   Collection Time: 05/03/20 11:57 PM   Specimen:  Nasopharyngeal  Result Value Ref Range  Status   MRSA by PCR NEGATIVE NEGATIVE Final    Comment:        The GeneXpert MRSA Assay (FDA approved for NASAL specimens only), is one component of a comprehensive MRSA colonization surveillance program. It is not intended to diagnose MRSA infection nor to guide or monitor treatment for MRSA infections. Performed at [facility], [address] [place], [place] [address]   Body fluid culture     Status: None   Collection Time: 05/07/20 12:00 PM   Specimen: PATH Cytology Peritoneal fluid  Result Value Ref Range Status   Specimen Description   Final    PERITONEAL Performed at [facility], [address] [place], [place] [address]    Special Requests   Final    PERITONEAL Performed at [facility], [address] [place], [place] [address]    Gram Stain   Final    WBC PRESENT,BOTH PMN AND MONONUCLEAR NO ORGANISMS SEEN CYTOSPIN SMEAR    Culture   Final    NO GROWTH 3 DAYS Performed at [facility], [address] [place], [place] [address]    Report Status 05/11/2020 FINAL  Final  Culture, blood (single) w Reflex to ID Panel     Status: None   Collection Time: 05/11/20  7:59 AM   Specimen: BLOOD  Result Value Ref Range Status   Specimen Description BLOOD LEFT WRIST  Final   Special Requests   Final    BOTTLES DRAWN AEROBIC AND ANAEROBIC Blood Culture adequate volume   Culture   Final    NO GROWTH 5 DAYS Performed at [facility], [address] [place], [place] [address]    Report Status 05/16/2020 FINAL  Final  CULTURE, BLOOD (ROUTINE X 2) w Reflex to ID Panel     Status: None (Preliminary result)   Collection Time: 05/15/20 11:52 PM   Specimen: Left Antecubital; Blood  Result Value Ref Range Status   Specimen Description LEFT ANTECUBITAL  Final   Special Requests   Final    BOTTLES DRAWN AEROBIC AND ANAEROBIC Blood Culture adequate volume   Culture   Final    NO GROWTH 2 DAYS Performed at [facility], [address] [place], [place] [address]    Report Status PENDING  Incomplete  CULTURE, BLOOD (ROUTINE X 2) w Reflex to ID Panel     Status: None (Preliminary result)   Collection Time: 05/15/20 11:53 PM   Specimen: BLOOD LEFT HAND  Result Value Ref Range Status   Specimen Description BLOOD LEFT HAND  Final   Special Requests   Final    BOTTLES DRAWN AEROBIC AND ANAEROBIC Blood Culture adequate volume   Culture   Final    NO GROWTH 2 DAYS Performed at [facility], [address] [place], [place] [address]    Report Status PENDING  Incomplete  MRSA PCR Screening     Status: None   Collection Time: 05/16/20 12:24 AM   Specimen: Nasopharyngeal  Result Value Ref Range Status   MRSA by PCR NEGATIVE NEGATIVE Final    Comment:        The GeneXpert MRSA Assay (FDA approved for NASAL specimens only), is one component of a comprehensive MRSA colonization surveillance program. It is not intended to diagnose MRSA infection nor to guide or monitor treatment for MRSA infections. Performed at [facility], [address] [place], [place] [address]  Urine Culture     Status: None   Collection Time: 05/16/20  3:27 AM   Specimen: Urine, Random  Result Value Ref Range Status   Specimen Description   Final    URINE, RANDOM Performed at [facility], [address] [place], [place] [address]    Special Requests   Final    NONE Performed at [facility], [address], [place], [place] [address]    Culture   Final    NO GROWTH Performed at [facility], [address] [place], [place] [address]    Report Status 05/17/2020 FINAL  Final  Gastrointestinal Panel by PCR , Stool     Status: None   Collection Time: 05/17/20  3:06 PM   Specimen: Stool  Result Value Ref Range Status   Campylobacter species NOT DETECTED NOT DETECTED Final   Plesimonas shigelloides NOT DETECTED NOT DETECTED Final   Salmonella species NOT DETECTED NOT  DETECTED Final   Yersinia enterocolitica NOT DETECTED NOT DETECTED Final   Vibrio species NOT DETECTED NOT DETECTED Final   Vibrio cholerae NOT DETECTED NOT DETECTED Final   Enteroaggregative E coli (EAEC) NOT DETECTED NOT DETECTED Final   Enteropathogenic E coli (EPEC) NOT DETECTED NOT DETECTED Final   Enterotoxigenic E coli (ETEC) NOT DETECTED NOT DETECTED Final   Shiga like toxin producing E coli (STEC) NOT DETECTED NOT DETECTED Final   Shigella/Enteroinvasive E coli (EIEC) NOT DETECTED NOT DETECTED Final   Cryptosporidium NOT DETECTED NOT DETECTED Final   Cyclospora cayetanensis NOT DETECTED NOT DETECTED Final   Entamoeba histolytica NOT DETECTED NOT DETECTED Final   Giardia lamblia NOT DETECTED NOT DETECTED Final   Adenovirus F40/41 NOT DETECTED NOT DETECTED Final   Astrovirus NOT DETECTED NOT DETECTED Final   Norovirus GI/GII NOT DETECTED NOT DETECTED Final   Rotavirus A NOT DETECTED NOT DETECTED Final   Sapovirus (I, II, IV, and V) NOT DETECTED NOT DETECTED Final    Comment: Performed at [facility], [address] [place], [place] [address]  C Difficile Quick Screen (NO PCR Reflex)     Status: None   Collection Time: 05/17/20  3:06 PM   Specimen: STOOL  Result Value Ref Range Status   C Diff antigen NEGATIVE NEGATIVE Final   C Diff toxin NEGATIVE NEGATIVE Final   C Diff interpretation No C. difficile detected.  Final    Comment: Performed at [facility], [address] [place], [place] [address]    Coagulation Studies: No results for input(s): LABPROT, INR in the last 72 hours.  Imaging: US Paracentesis  Result Date: 05/16/2020 INDICATION: History of alcoholic cirrhosis, now with symptomatic ascites. Please perform ultrasound-guided paracentesis for therapeutic purposes. EXAM: ULTRASOUND-GUIDED PARACENTESIS COMPARISON:  CT abdomen pelvis-05/16/2020; ultrasound-guided paracentesis performed 05/07/2020 (yielding 2.8 L of peritoneal fluid) MEDICATIONS:  None. COMPLICATIONS: None immediate. TECHNIQUE: Informed written consent was obtained from the patient after a discussion of the risks, benefits and alternatives to treatment. A timeout was performed prior to the initiation of the procedure. Initial ultrasound scanning demonstrates a moderate amount of ascites within the right lower abdomen which was subsequently prepped and draped in the usual sterile fashion. 1% lidocaine with epinephrine was used for local anesthesia. Under direct ultrasound guidance, a 19 gauge, 7-cm, Yueh catheter was introduced. An ultrasound image was saved for documentation purposed. The paracentesis was performed. The catheter was removed and a dressing was applied. The patient tolerated the procedure well without immediate post procedural complication.  FINDINGS: A total of approximately 3 liters of dark serous ascitic fluid was removed. IMPRESSION: Successful ultrasound-guided paracentesis yielding 3 liters of peritoneal fluid. Electronically Signed   By: Simonne Come M.D.   On: 05/16/2020 15:55   ECHOCARDIOGRAM COMPLETE  Result Date: 05/17/2020    ECHOCARDIOGRAM REPORT   Patient Name:   Shane Stanco Sr. Date of Exam: 05/16/2020 Medical Rec #:  [id number]       Height:       67.0 in Accession #:    [id number]      Weight:       150.6 lb Date of Birth:  1985-12-07        BSA:          1.792 m Patient Age:    34 years        BP:           94/63 mmHg Patient Gender: M               HR:           91 bpm. Exam Location:  [facility] Procedure: 2D Echo, Cardiac Doppler and Color Doppler Indications:     Shock  History:         Patient has no prior history of Echocardiogram examinations.                  Risk Factors:Alochol Abuse.  Sonographer:     Sedonia Small Rodgers-Mikulski Referring Phys:  [id number] Judithe Modest Diagnosing Phys: Julien Nordmann MD IMPRESSIONS  1. Left ventricular ejection fraction, by estimation, is 60 to 65%. The left ventricle has normal function. The left ventricle has no regional wall  motion abnormalities. Left ventricular diastolic parameters were normal.  2. Right ventricular systolic function is normal. The right ventricular size is normal.  3. The mitral valve is normal in structure. No evidence of mitral valve regurgitation. No evidence of mitral stenosis.  4. The aortic valve is normal in structure. Aortic valve regurgitation is not visualized. No aortic stenosis is present.  5. The inferior vena cava is normal in size with greater than 50% respiratory variability, suggesting right atrial pressure of 3 mmHg. FINDINGS  Left Ventricle: Left ventricular ejection fraction, by estimation, is 60 to 65%. The left ventricle has normal function. The left ventricle has no regional wall motion abnormalities. The left ventricular internal cavity size was normal in size. There is  no left ventricular hypertrophy. Left ventricular diastolic parameters were normal. Right Ventricle: The right ventricular size is normal. No increase in right ventricular wall thickness. Right ventricular systolic function is normal. Left Atrium: Left atrial size was normal in size. Right Atrium: Right atrial size was normal in size. Pericardium: There is no evidence of pericardial effusion. Mitral Valve: The mitral valve is normal in structure. Normal mobility of the mitral valve leaflets. No evidence of mitral valve regurgitation. No evidence of mitral valve stenosis. Tricuspid Valve: The tricuspid valve is normal in structure. Tricuspid valve regurgitation is not demonstrated. No evidence of tricuspid stenosis. Aortic Valve: The aortic valve is normal in structure. Aortic valve regurgitation is not visualized. No aortic stenosis is present. Pulmonic Valve: The pulmonic valve was normal in structure. Pulmonic valve regurgitation is not visualized. No evidence of pulmonic stenosis. Aorta: The aortic root is normal in size and structure. Venous: The inferior vena cava is normal in size with greater than 50% respiratory  variability, suggesting right atrial pressure of 3 mmHg. IAS/Shunts: No atrial level shunt detected by  color flow Doppler.  LEFT VENTRICLE PLAX 2D LVIDd:         4.34 cm  Diastology LVIDs:         2.77 cm  LV e' lateral:   10.30 cm/s LV PW:         0.97 cm  LV E/e' lateral: 10.0 LV IVS:        0.87 cm  LV e' medial:    9.79 cm/s LVOT diam:     2.10 cm  LV E/e' medial:  10.5 LV SV:         59 LV SV Index:   33 LVOT Area:     3.46 cm  RIGHT VENTRICLE             IVC RV Basal diam:  3.44 cm     IVC diam: 1.92 cm RV S prime:     16.90 cm/s TAPSE (M-mode): 2.5 cm LEFT ATRIUM             Index       RIGHT ATRIUM          Index LA diam:        3.60 cm 2.01 cm/m  RA Area:     9.62 cm LA Vol (A2C):   40.9 ml 22.82 ml/m RA Volume:   21.10 ml 11.77 ml/m LA Vol (A4C):   29.0 ml 16.18 ml/m LA Biplane Vol: 34.8 ml 19.42 ml/m  AORTIC VALVE LVOT Vmax:   88.60 cm/s LVOT Vmean:  61.900 cm/s LVOT VTI:    0.169 m  AORTA Ao Root diam: 3.50 cm MITRAL VALVE MV Area (PHT): 2.39 cm     SHUNTS MV Decel Time: 317 msec     Systemic VTI:  0.17 m MV E velocity: 103.00 cm/s  Systemic Diam: 2.10 cm MV A velocity: 107.00 cm/s MV E/A ratio:  0.96 Julien Nordmann MD Electronically signed by Julien Nordmann MD Signature Date/Time: 05/17/2020/7:45:35 AM    Final     Medications:  I have reviewed the patient's current medications. Scheduled: . Chlorhexidine Gluconate Cloth  6 each Topical Daily  . Gerhardt's butt cream   Topical TID  . insulin aspart  0-9 Units Subcutaneous Q6H  . pantoprazole (PROTONIX) IV  40 mg Intravenous Q12H  . sodium chloride flush  3 mL Intravenous Q12H    Assessment/Plan: 34 y.o.malewith a long history of ETOH abuse. Abruptly stopped drinking the day before presentation (6/24) and became progressively confused. Was brought to the ED by his wife and while in the ED was witnessed to have a seizure. Patient has not had further seizures but remained confused and lethargic.MRI of the brain personally  reviewed and reveals a possible cerebral venous thrombosis as well as abnormal diffusion in the right cerebellum, bilateral parietal lobes and left occipital pole. 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 Thyroid normal. EEGonly significant for slowing. B1 pending.Mental status worsened on 7/6.  Repeat MRI of the brain repeated and showed no new infarcts. No evidence of hydrocephalus or hemorrhage. Paracentesis performed on 7/7.  Patient with improved mental status today. Awake and alert.     Recommendations: 1.Continue heparin.  Will reimage and if CVT improved will start on Eliquis 2. Thiamine level pending   LOS: 15 days   Thana Farr, MD Neurology [phone] 05/18/2020  11:16 AM

## 2020-05-18 NOTE — Consult Note (Signed)
ANTICOAGULATION CONSULT NOTE  Pharmacy Consult for Heparin infusion  Indication: cerebral venous thrombosis  Patient Measurements: Height: 5\' 7"  (170.2 cm) Weight: 68.3 kg (150 lb 9.2 oz) IBW/kg (Calculated) : 66.1  Vital Signs: Temp: 97.5 F (36.4 C) (07/09 1600) Temp Source: Oral (07/09 1600) BP: 127/95 (07/09 1900) Pulse Rate: 99 (07/09 1900)  Labs: Recent Labs    05/16/20 0331 05/16/20 0802 05/17/20 0535 05/17/20 0718 05/18/20 0032 05/18/20 0032 05/18/20 0517 05/18/20 0752 05/18/20 1830  HGB 7.9*   < > 7.8*   < > 7.3*   < > 7.0*  --  9.8*  HCT 22.6*   < > 22.5*   < > 21.7*  --  20.0*  --  28.6*  PLT 151  --  144*   < > 125*  --  121*  --  113*  HEPARINUNFRC  --    < >  --    < > 0.20*  --   --  0.30 0.28*  CREATININE 1.42*  --  1.18  --   --   --  0.81  --   --   TROPONINIHS 16  --   --   --   --   --   --   --   --    < > = values in this interval not displayed.    Estimated Creatinine Clearance: 120.1 mL/min (by C-G formula based on SCr of 0.81 mg/dL).   Medical History: Past Medical History:  Diagnosis Date  . Alcohol use disorder, severe, dependence (HCC)    Assessment: Pharmacy consulted for heparin infusion dosing and monitoring for 34 yo male for cerebral venous thrombosis.   CT Head: "Confirmed thrombus within the confluence of sinuses and left transverse dural venous sinus. 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 No evidence of hemorrhagic conversion." H&H continue to trend down, platelets have normalized  07/05  0040 HL 0.20 subtherapeutic - rate increased to 1150 units/hr  07/05  1004 HL 0.70 supratherapeutic - rate decreased to 1000 units/hr 07/05 1804 HL < 0.10: inc to 1150 units/hr 07/06  0205 HL 0.32: no change 07/06  0757 HL 0.54: decreased to 900 units/hr 07/06  2200 HL 0.20: increased to 1100 units/hr 07/07  0802 HL 0.23: increased to 1150 units/hr 07/07  1812 HL 0.31: no change 07/08  0020 HL 0.32: no  change 07/08  0718 HL 0.29: increased to 1200 units/hr 07/08  1611 HL 0.12 increase to 1350 units/hr   07/09  0032 HL 0.20:increase to 1450 units/hr   07/09  0752 HL 0.30:increase to 1550 units/hr    Goal of Therapy:  Heparin level 0.3-0.5 units/ml Monitor platelets by anticoagulation protocol: Yes   Plan:   7/9 @1830  HL=0.28. Heparin level  subtherapeutic: increase to 1650 units/hr   recheck heparin level in 6 hours after rate change  F/U CBC in am  9/9, PharmD Clinical Pharmacist 05/18/2020 7:40 PM

## 2020-05-18 NOTE — Progress Notes (Signed)
Shane Miniumarren Orell Hurtado, MD [facility]   [address] [place], [place] [address] Phone: [phone] Fax : [phone]   Subjective: The patient is much more alert this morning and was reported to have consumed yogurt this morning.  The patient's GI panel did not show any cause for his diarrhea.   Objective: Vital signs in last 24 hours: Vitals:   05/18/20 1131 05/18/20 1140 05/18/20 1200 05/18/20 1300  BP: 118/82  114/64 124/79  Pulse: 93  84 100  Resp: 18  20 20   Temp: (!) 97.3 F (36.3 C) (!) 97.3 F (36.3 C) (!) 97.3 F (36.3 C)   TempSrc: Oral Oral Oral   SpO2: 100%  98% 98%  Weight:      Height:       Weight change:   Intake/Output Summary (Last 24 hours) at 05/18/2020 1316 Last data filed at 05/18/2020 1203 Gross per 24 hour  Intake 2459.66 ml  Output 1760 ml  Net 699.66 ml     Exam: General: Patient is sitting up in bed and in no apparent distress.    Lab Results: @LABTEST2 @ Micro Results: Recent Results (from the past 240 hour(s))  Culture, blood (single) w Reflex to ID Panel     Status: None   Collection Time: 05/11/20  7:59 AM   Specimen: BLOOD  Result Value Ref Range Status   Specimen Description BLOOD LEFT WRIST  Final   Special Requests   Final    BOTTLES DRAWN AEROBIC AND ANAEROBIC Blood Culture adequate volume   Culture   Final    NO GROWTH 5 DAYS Performed at [facility], [address] [place], [place] [address]    Report Status 05/16/2020 FINAL  Final  CULTURE, BLOOD (ROUTINE X 2) w Reflex to ID Panel     Status: None (Preliminary result)   Collection Time: 05/15/20 11:52 PM   Specimen: Left Antecubital; Blood  Result Value Ref Range Status   Specimen Description LEFT ANTECUBITAL  Final   Special Requests   Final    BOTTLES DRAWN AEROBIC AND ANAEROBIC Blood Culture adequate volume   Culture   Final    NO GROWTH 2 DAYS Performed at [facility], [address] [place], [place] [address]    Report Status PENDING   Incomplete  CULTURE, BLOOD (ROUTINE X 2) w Reflex to ID Panel     Status: None (Preliminary result)   Collection Time: 05/15/20 11:53 PM   Specimen: BLOOD LEFT HAND  Result Value Ref Range Status   Specimen Description BLOOD LEFT HAND  Final   Special Requests   Final    BOTTLES DRAWN AEROBIC AND ANAEROBIC Blood Culture adequate volume   Culture   Final    NO GROWTH 2 DAYS Performed at [facility], [address] [place], [place] [address]    Report Status PENDING  Incomplete  MRSA PCR Screening     Status: None   Collection Time: 05/16/20 12:24 AM   Specimen: Nasopharyngeal  Result Value Ref Range Status   MRSA by PCR NEGATIVE NEGATIVE Final    Comment:        The GeneXpert MRSA Assay (FDA approved for NASAL specimens only), is one component of a comprehensive MRSA colonization surveillance program. It is not intended to diagnose MRSA infection nor to guide or monitor treatment for MRSA infections. Performed at [facility], [address] [place], [place] [address]   Urine Culture     Status: None  Collection Time: 05/16/20  3:27 AM   Specimen: Urine, Random  Result Value Ref Range Status   Specimen Description   Final    URINE, RANDOM Performed at [facility], [address] [place], [place] [address]    Special Requests   Final    NONE Performed at [facility], [address] [place], [place] [address]    Culture   Final    NO GROWTH Performed at [facility], [address] [place], [place] [address]    Report Status 05/17/2020 FINAL  Final  Gastrointestinal Panel by PCR , Stool     Status: None   Collection Time: 05/17/20  3:06 PM   Specimen: Stool  Result Value Ref Range Status   Campylobacter species NOT DETECTED NOT DETECTED Final   Plesimonas shigelloides NOT DETECTED NOT DETECTED Final   Salmonella species NOT DETECTED NOT DETECTED Final   Yersinia enterocolitica NOT DETECTED NOT DETECTED Final    Vibrio species NOT DETECTED NOT DETECTED Final   Vibrio cholerae NOT DETECTED NOT DETECTED Final   Enteroaggregative E coli (EAEC) NOT DETECTED NOT DETECTED Final   Enteropathogenic E coli (EPEC) NOT DETECTED NOT DETECTED Final   Enterotoxigenic E coli (ETEC) NOT DETECTED NOT DETECTED Final   Shiga like toxin producing E coli (STEC) NOT DETECTED NOT DETECTED Final   Shigella/Enteroinvasive E coli (EIEC) NOT DETECTED NOT DETECTED Final   Cryptosporidium NOT DETECTED NOT DETECTED Final   Cyclospora cayetanensis NOT DETECTED NOT DETECTED Final   Entamoeba histolytica NOT DETECTED NOT DETECTED Final   Giardia lamblia NOT DETECTED NOT DETECTED Final   Adenovirus F40/41 NOT DETECTED NOT DETECTED Final   Astrovirus NOT DETECTED NOT DETECTED Final   Norovirus GI/GII NOT DETECTED NOT DETECTED Final   Rotavirus A NOT DETECTED NOT DETECTED Final   Sapovirus (I, II, IV, and V) NOT DETECTED NOT DETECTED Final    Comment: Performed at [facility], [address] [place], [place] [address]  C Difficile Quick Screen (NO PCR Reflex)     Status: None   Collection Time: 05/17/20  3:06 PM   Specimen: STOOL  Result Value Ref Range Status   C Diff antigen NEGATIVE NEGATIVE Final   C Diff toxin NEGATIVE NEGATIVE Final   C Diff interpretation No C. difficile detected.  Final    Comment: Performed at [facility], [address] [place], [place] [address]   Studies/Results: US Paracentesis  Result Date: 05/16/2020 INDICATION: History of alcoholic cirrhosis, now with symptomatic ascites. Please perform ultrasound-guided paracentesis for therapeutic purposes. EXAM: ULTRASOUND-GUIDED PARACENTESIS COMPARISON:  CT abdomen pelvis-05/16/2020; ultrasound-guided paracentesis performed 05/07/2020 (yielding 2.8 L of peritoneal fluid) MEDICATIONS: None. COMPLICATIONS: None immediate. TECHNIQUE: Informed written consent was obtained from the patient after a discussion of the risks, benefits and  alternatives to treatment. A timeout was performed prior to the initiation of the procedure. Initial ultrasound scanning demonstrates a moderate amount of ascites within the right lower abdomen which was subsequently prepped and draped in the usual sterile fashion. 1% lidocaine with epinephrine was used for local anesthesia. Under direct ultrasound guidance, a 19 gauge, 7-cm, Yueh catheter was introduced. An ultrasound image was saved for documentation purposed. The paracentesis was performed. The catheter was removed and a dressing was applied. The patient tolerated the procedure well without immediate post procedural complication. FINDINGS: A total of approximately 3 liters of dark serous ascitic fluid was removed. IMPRESSION: Successful ultrasound-guided paracentesis yielding 3 liters of peritoneal fluid. Electronically  Signed   By: Simonne Come M.D.   On: 05/16/2020 15:55   ECHOCARDIOGRAM COMPLETE  Result Date: 05/17/2020    ECHOCARDIOGRAM REPORT   Patient Name:   Shane Vorndran Sr. Date of Exam: 05/16/2020 Medical Rec #:  [id number]       Height:       67.0 in Accession #:    [id number]      Weight:       150.6 lb Date of Birth:  Jun 21, 1986        BSA:          1.792 m Patient Age:    34 years        BP:           94/63 mmHg Patient Gender: M               HR:           91 bpm. Exam Location:  [facility] Procedure: 2D Echo, Cardiac Doppler and Color Doppler Indications:     Shock  History:         Patient has no prior history of Echocardiogram examinations.                  Risk Factors:Alochol Abuse.  Sonographer:     Sedonia Small Rodgers-Colombe Referring Phys:  [id number] Judithe Modest Diagnosing Phys: Julien Nordmann MD IMPRESSIONS  1. Left ventricular ejection fraction, by estimation, is 60 to 65%. The left ventricle has normal function. The left ventricle has no regional wall motion abnormalities. Left ventricular diastolic parameters were normal.  2. Right ventricular systolic function is normal. The right ventricular  size is normal.  3. The mitral valve is normal in structure. No evidence of mitral valve regurgitation. No evidence of mitral stenosis.  4. The aortic valve is normal in structure. Aortic valve regurgitation is not visualized. No aortic stenosis is present.  5. The inferior vena cava is normal in size with greater than 50% respiratory variability, suggesting right atrial pressure of 3 mmHg. FINDINGS  Left Ventricle: Left ventricular ejection fraction, by estimation, is 60 to 65%. The left ventricle has normal function. The left ventricle has no regional wall motion abnormalities. The left ventricular internal cavity size was normal in size. There is  no left ventricular hypertrophy. Left ventricular diastolic parameters were normal. Right Ventricle: The right ventricular size is normal. No increase in right ventricular wall thickness. Right ventricular systolic function is normal. Left Atrium: Left atrial size was normal in size. Right Atrium: Right atrial size was normal in size. Pericardium: There is no evidence of pericardial effusion. Mitral Valve: The mitral valve is normal in structure. Normal mobility of the mitral valve leaflets. No evidence of mitral valve regurgitation. No evidence of mitral valve stenosis. Tricuspid Valve: The tricuspid valve is normal in structure. Tricuspid valve regurgitation is not demonstrated. No evidence of tricuspid stenosis. Aortic Valve: The aortic valve is normal in structure. Aortic valve regurgitation is not visualized. No aortic stenosis is present. Pulmonic Valve: The pulmonic valve was normal in structure. Pulmonic valve regurgitation is not visualized. No evidence of pulmonic stenosis. Aorta: The aortic root is normal in size and structure. Venous: The inferior vena cava is normal in size with greater than 50% respiratory variability, suggesting right atrial pressure of 3 mmHg. IAS/Shunts: No atrial level shunt detected by color flow Doppler.  LEFT VENTRICLE PLAX 2D  LVIDd:         4.34 cm  Diastology LVIDs:  2.77 cm  LV e' lateral:   10.30 cm/s LV PW:         0.97 cm  LV E/e' lateral: 10.0 LV IVS:        0.87 cm  LV e' medial:    9.79 cm/s LVOT diam:     2.10 cm  LV E/e' medial:  10.5 LV SV:         59 LV SV Index:   33 LVOT Area:     3.46 cm  RIGHT VENTRICLE             IVC RV Basal diam:  3.44 cm     IVC diam: 1.92 cm RV S prime:     16.90 cm/s TAPSE (M-mode): 2.5 cm LEFT ATRIUM             Index       RIGHT ATRIUM          Index LA diam:        3.60 cm 2.01 cm/m  RA Area:     9.62 cm LA Vol (A2C):   40.9 ml 22.82 ml/m RA Volume:   21.10 ml 11.77 ml/m LA Vol (A4C):   29.0 ml 16.18 ml/m LA Biplane Vol: 34.8 ml 19.42 ml/m  AORTIC VALVE LVOT Vmax:   88.60 cm/s LVOT Vmean:  61.900 cm/s LVOT VTI:    0.169 m  AORTA Ao Root diam: 3.50 cm MITRAL VALVE MV Area (PHT): 2.39 cm     SHUNTS MV Decel Time: 317 msec     Systemic VTI:  0.17 m MV E velocity: 103.00 cm/s  Systemic Diam: 2.10 cm MV A velocity: 107.00 cm/s MV E/A ratio:  0.96 Julien Nordmann MD Electronically signed by Julien Nordmann MD Signature Date/Time: 05/17/2020/7:45:35 AM    Final    Medications: I have reviewed the patient's current medications. Scheduled Meds: . Chlorhexidine Gluconate Cloth  6 each Topical Daily  . Gerhardt's butt cream   Topical TID  . insulin aspart  0-9 Units Subcutaneous Q6H  . pantoprazole (PROTONIX) IV  40 mg Intravenous Q12H  . sodium chloride flush  3 mL Intravenous Q12H   Continuous Infusions: . sodium chloride Stopped (05/13/20 1628)  . heparin 1,550 Units/hr (05/18/20 1203)  . meropenem (MERREM) IV Stopped (05/18/20 0819)  . norepinephrine (LEVOPHED) Adult infusion 213.333 mcg/min (05/18/20 1203)  . TPN ADULT (ION) 60 mL/hr at 05/18/20 1203  . TPN ADULT (ION)     PRN Meds:.sodium chloride, hydrALAZINE, HYDROmorphone (DILAUDID) injection, labetalol, loperamide, LORazepam, ondansetron **OR** ondansetron (ZOFRAN) IV   Assessment: Principal Problem:   Acute  alcoholic pancreatitis Active Problems:   Alcohol withdrawal seizure with delirium (HCC)   Hyponatremia   Hypokalemia   AKI (acute kidney injury) (HCC)   Elevated CK   Heme positive stool   Thrombocytopenia (HCC)   Acute metabolic encephalopathy   Severe protein-calorie malnutrition (HCC)   Sinus tachycardia    Plan: This patient has a long history of alcohol abuse with admission to the ER with seizures and lethargy.  The patient had imaging that showed a cerebral venous thrombus in addition to other changes of the parietal lobes and occipital lobes.  The patient's white cell count on the eighth of this month was 39 and it went down to 20.8 at midnight last night and is 27.0 this morning.  The patient's hemoglobin has also trended down and is now 7.0.  The patient is followed by the ICU intensivist, infectious disease, neurology and myself and  appears to slightly be improving.  I would recommend continued conservative management in conjunction with palliative care medicine who is communicating with the patient about long-term goals and expectations.   LOS: 15 days   Felipe Cabell 05/18/2020, 1:16 PM Pager [phone] 7am-5pm  Check AMION for 5pm -7am coverage and on weekends

## 2020-05-18 NOTE — Consult Note (Signed)
ANTICOAGULATION CONSULT NOTE  Pharmacy Consult for Heparin infusion  Indication: cerebral venous thrombosis  Patient Measurements: Height: 5\' 7"  (170.2 cm) Weight: 68.3 kg (150 lb 9.2 oz) IBW/kg (Calculated) : 66.1  Vital Signs: Temp: 97.2 F (36.2 C) (07/09 0300) BP: 95/62 (07/09 0400) Pulse Rate: 83 (07/09 0400)  Labs: Recent Labs    05/16/20 0331 05/16/20 0802 05/17/20 0020 05/17/20 0535 05/17/20 0535 05/17/20 0718 05/17/20 1611 05/18/20 0032 05/18/20 0517  HGB 7.9*   < >  --  7.8*   < >  --   --  7.3* 7.0*  HCT 22.6*   < >  --  22.5*  --   --   --  21.7* 20.0*  PLT 151  --   --  144*  --   --   --  125* 121*  HEPARINUNFRC  --    < >   < >  --   --  0.29* 0.12* 0.20*  --   CREATININE 1.42*  --   --  1.18  --   --   --   --  0.81  TROPONINIHS 16  --   --   --   --   --   --   --   --    < > = values in this interval not displayed.    Estimated Creatinine Clearance: 120.1 mL/min (by C-G formula based on SCr of 0.81 mg/dL).   Medical History: Past Medical History:  Diagnosis Date  . Alcohol use disorder, severe, dependence (HCC)    Assessment: Pharmacy consulted for heparin infusion dosing and monitoring for 34 yo male for cerebral venous thrombosis.   CT Head: "Confirmed thrombus within the confluence of sinuses and left transverse dural venous sinus. 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 No evidence of hemorrhagic conversion." H&H continue to trend down, platelets have normalized  07/05  0040 HL 0.20 subtherapeutic - rate increased to 1150 units/hr  07/05  1004 HL 0.70 supratherapeutic - rate decreased to 1000 units/hr 07/05 1804 HL < 0.10: inc to 1150 units/hr 07/06  0205 HL 0.32: no change 07/06  0757 HL 0.54: decreased to 900 units/hr 07/06  2200 HL 0.20: increased to 1100 units/hr 07/07  0802 HL 0.23: increased to 1150 units/hr 07/07  1812 HL 0.31: no change 07/08  0020 HL 0.32: no change 07/08  0718 HL 0.29: increased to 1200  units/hr 07/08  1611 HL 0.12 increase to 1350 units/hr   07/09  0032 HL 0.20:increase to 1450 units/hr   07/09  0752 HL 0.30:increase to 1550 units/hr    Goal of Therapy:  Heparin level 0.3-0.5 units/ml Monitor platelets by anticoagulation protocol: Yes   Plan:   Heparin level borderline therapeutic: increase rate slightly (based on previous response) to 1550 units/hr   recheck heparin level in 6 hours after rate change  F/U CBC in am  09/09, PharmD Clinical Pharmacist 05/18/2020 7:07 AM

## 2020-05-18 NOTE — Progress Notes (Signed)
Pharmacy Antibiotic Note  Shane Springborn Sr. is a 34 y.o. male admitted on 05/03/2020 with sepsis  due to pancreatitis.  Pharmacy was consulted for Meropenem dosing. This is day  # 12 of broad spectrum antibiotics (ID is following this patient). His renal function has improved since admission to his apparent baseline.  Plan: continue meropenem 1 gram IV every 8 hours   Height: 5\' 7"  (170.2 cm) Weight: 68.3 kg (150 lb 9.2 oz) IBW/kg (Calculated) : 66.1  Temp (24hrs), Avg:97.7 F (36.5 C), Min:96.6 F (35.9 C), Max:98.8 F (37.1 C)  Recent Labs  Lab 05/15/20 0205 05/15/20 0205 05/15/20 2006 05/15/20 2223 05/16/20 0327 05/16/20 0331 05/17/20 0535 05/18/20 0032 05/18/20 0517  WBC 26.3*   < > 34.2*  --   --  40.4* 39.8* 28.8* 27.0*  CREATININE 0.78  --  1.31*  --   --  1.42* 1.18  --  0.81  LATICACIDVEN  --   --   --  2.0* 1.1  --   --   --   --    < > = values in this interval not displayed.    Estimated Creatinine Clearance: 120.1 mL/min (by C-G formula based on SCr of 0.81 mg/dL).    Allergies  Allergen Reactions  . Sudafed [Pseudoephedrine]     History per pt of this being linked with his seizures    Antimicrobials this admission: Zosyn 7/1 >> 7/8 meropenem 6/28 >> 7/1  7/8 >>  Microbiology results: 7/7 BCx: NG x 2 days 7/8 C diff: negative 7/8 GI panel: negative 7/7 UCx: negative  7/7 MRSA PCR: negative 6/28 peritoneal fluid Cx: negative  Thank you for allowing pharmacy to be a part of this patient's care.  7/28 05/18/2020 8:14 AM

## 2020-05-18 NOTE — Consult Note (Signed)
PHARMACY - TOTAL PARENTERAL NUTRITION CONSULT NOTE   Indication: severe malnutrition  Patient Measurements: Height: 5\' 7"  (170.2 cm) Weight: 68.3 kg (150 lb 9.2 oz) IBW/kg (Calculated) : 66.1 TPN AdjBW (KG): 68.3 Body mass index is 23.58 kg/m.  Assessment: 34 year old male with PMHx of EtOH abuse admitted with alcohol withdrawal, acute alcoholic pancreatitis, acute metabolic encephalopathy, ascites s/p paracentesis on 6/28 with 2.8 L fluid removed, chest pain, acute renal failure.   Glucose / Insulin: sSSI q6h  CBG 139 - 158 (required 6 units SSI) Electrolytes: now WNL Renal: SCr 1.75-->0.54-->1.42-->1.18 LFTs / TGs: AST/ALT: 43/20 U/L at baseline Prealbumin / albumin: albumin 1.8 g/dL (trending down) Intake / Output; MIVF: 1690/2260 mL, no MIVF GI Imaging: none since TPN began Surgeries / Procedures:  7/7: US-guided paracentesis: 3L removed  Central access: 05/16/20 TPN start date: 05/16/20  Nutritional Goals (per RD recommendation on 05/16/20): kCal: 1900 - 2200 / day, Protein: 95 - 105 grams/day, Fluid: 1900 - 2200 mL/day Goal TPN rate is 85 mL/hr (provides 105 g of protein and 2200 kcals per day)  Current Nutrition:  Regular Diet (remains too lethargic to consume much although improved somewhat)  Plan:   Increase TPN to goal rate to 85 mL/hr at 1800  AA 51 g/L, dextrose 16%, lipids 32 g/L, 2140 mL total volume  Electrolytes in TPN  Cl:Ac 1:1,  Na: 75, K 60, Ca 5, Mg 10, phosphorous 10 (all mEq/L)  Increased Na, K, phosphorous, decreased magnesium  Add standard MVI, 100 mg thiamine, 1 mg folic acid and trace elements to TPN  continue Sensitive q6h SSI and adjust as needed   Monitor TPN labs daily until stable due to high risk for refeeding syndrome  07/17/20 05/18/2020,7:07 AM

## 2020-05-19 LAB — BASIC METABOLIC PANEL
Anion gap: 6 (ref 5–15)
BUN: 13 mg/dL (ref 6–20)
CO2: 25 mmol/L (ref 22–32)
Calcium: 7.6 mg/dL — ABNORMAL LOW (ref 8.9–10.3)
Chloride: 101 mmol/L (ref 98–111)
Creatinine, Ser: 0.72 mg/dL (ref 0.61–1.24)
GFR calc Af Amer: >60 mL/min (ref >60)
GFR calc non Af Amer: >60 mL/min (ref >60)
Glucose, Bld: 145 mg/dL — ABNORMAL HIGH (ref 70–99)
Potassium: 4.5 mmol/L (ref 3.5–5.1)
Sodium: 132 mmol/L — ABNORMAL LOW (ref 135–145)

## 2020-05-19 LAB — TYPE AND SCREEN
ABO/RH(D): A POS
Antibody Screen: NEGATIVE
Unit division: 0
Unit division: 0

## 2020-05-19 LAB — RENAL FUNCTION PANEL
Albumin: 1.6 g/dL — ABNORMAL LOW (ref 3.5–5.0)
Anion gap: 7 (ref 5–15)
BUN: 13 mg/dL (ref 6–20)
CO2: 25 mmol/L (ref 22–32)
Calcium: 7.7 mg/dL — ABNORMAL LOW (ref 8.9–10.3)
Chloride: 101 mmol/L (ref 98–111)
Creatinine, Ser: 0.69 mg/dL (ref 0.61–1.24)
GFR calc Af Amer: >60 mL/min (ref >60)
GFR calc non Af Amer: >60 mL/min (ref >60)
Glucose, Bld: 145 mg/dL — ABNORMAL HIGH (ref 70–99)
Phosphorus: 3.4 mg/dL (ref 2.5–4.6)
Potassium: 4.6 mmol/L (ref 3.5–5.1)
Sodium: 133 mmol/L — ABNORMAL LOW (ref 135–145)

## 2020-05-19 LAB — BPAM RBC
Blood Product Expiration Date: 202108032359
Blood Product Expiration Date: 202108062359
ISSUE DATE / TIME: 202107091107
ISSUE DATE / TIME: 202107091404
Unit Type and Rh: 6200
Unit Type and Rh: 6200

## 2020-05-19 LAB — HEPARIN LEVEL (UNFRACTIONATED)
Heparin Unfractionated: 0.25 IU/mL — ABNORMAL LOW (ref 0.30–0.70)
Heparin Unfractionated: 0.33 IU/mL (ref 0.30–0.70)
Heparin Unfractionated: 0.31 IU/mL (ref 0.30–0.70)

## 2020-05-19 LAB — CBC
HCT: 28 % — ABNORMAL LOW (ref 39.0–52.0)
Hemoglobin: 9.7 g/dL — ABNORMAL LOW (ref 13.0–17.0)
MCH: 31.3 pg (ref 26.0–34.0)
MCHC: 34.6 g/dL (ref 30.0–36.0)
MCV: 90.3 fL (ref 80.0–100.0)
Platelets: 94 10*3/uL — ABNORMAL LOW (ref 150–400)
RBC: 3.1 MIL/uL — ABNORMAL LOW (ref 4.22–5.81)
RDW: 15.8 % — ABNORMAL HIGH (ref 11.5–15.5)
WBC: 16.9 10*3/uL — ABNORMAL HIGH (ref 4.0–10.5)
nRBC: 0 % (ref 0.0–0.2)

## 2020-05-19 LAB — GLUCOSE, CAPILLARY
Glucose-Capillary: 148 mg/dL — ABNORMAL HIGH (ref 70–99)
Glucose-Capillary: 157 mg/dL — ABNORMAL HIGH (ref 70–99)
Glucose-Capillary: 159 mg/dL — ABNORMAL HIGH (ref 70–99)
Glucose-Capillary: 165 mg/dL — ABNORMAL HIGH (ref 70–99)
Glucose-Capillary: 179 mg/dL — ABNORMAL HIGH (ref 70–99)

## 2020-05-19 LAB — MAGNESIUM: Magnesium: 1.9 mg/dL (ref 1.7–2.4)

## 2020-05-19 MED ORDER — TRAVASOL 10 % IV SOLN
INTRAVENOUS | Status: AC
  Filled 2020-05-19: qty 1040.4

## 2020-05-19 NOTE — Consult Note (Signed)
ANTICOAGULATION CONSULT NOTE  Pharmacy Consult for Heparin infusion  Indication: cerebral venous thrombosis  Patient Measurements: Height: 5\' 7"  (170.2 cm) Weight: 68.3 kg (150 lb 9.2 oz) IBW/kg (Calculated) : 66.1  Vital Signs: Temp: 98.2 F (36.8 C) (07/09 2000) Temp Source: Oral (07/09 2000) BP: 125/81 (07/10 0500) Pulse Rate: 100 (07/10 0500)  Labs: Recent Labs    05/17/20 0535 05/17/20 07/18/20 05/18/20 0032 05/18/20 0517 05/18/20 0517 05/18/20 0752 05/18/20 1830 05/19/20 0214  HGB 7.8*   < >  --  7.0*   < >  --  9.8* 9.7*  HCT 22.5*   < >  --  20.0*  --   --  28.6* 28.0*  PLT 144*   < >  --  121*  --   --  113* 94*  HEPARINUNFRC  --    < >   < >  --   --  0.30 0.28* 0.25*  CREATININE 1.18  --   --  0.81  --   --   --  0.69  0.72   < > = values in this interval not displayed.    Estimated Creatinine Clearance: 121.6 mL/min (by C-G formula based on SCr of 0.69 mg/dL).   Medical History: Past Medical History:  Diagnosis Date  . Alcohol use disorder, severe, dependence (HCC)    Assessment: Pharmacy consulted for heparin infusion dosing and monitoring for 34 yo male for cerebral venous thrombosis.   CT Head: "Confirmed thrombus within the confluence of sinuses and left transverse dural venous sinus. 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 No evidence of hemorrhagic conversion." H&H, platelets continue to trend down slightliy  07/05  0040 HL 0.20 subtherapeutic - rate increased to 1150 units/hr  07/05  1004 HL 0.70 supratherapeutic - rate decreased to 1000 units/hr 07/05 1804 HL < 0.10: inc to 1150 units/hr 07/06  0205 HL 0.32: no change 07/06  0757 HL 0.54: decreased to 900 units/hr 07/06  2200 HL 0.20: increased to 1100 units/hr 07/07  0802 HL 0.23: increased to 1150 units/hr 07/07  1812 HL 0.31: no change 07/08  0020 HL 0.32: no change 07/08  0718 HL 0.29: increased to 1200 units/hr 07/08  1611 HL 0.12 increase to 1350 units/hr    07/09  0032 HL 0.20:increase to 1450 units/hr   07/09  0752 HL 0.30:increase to 1550 units/hr   07/10 0214 HL 0.25: increase to 1750 units/hr 07/10 0955 HL 0.33: no change  Goal of Therapy:  Heparin level 0.3-0.5 units/ml Monitor platelets by anticoagulation protocol: Yes   Plan:   Anti-Xa level therapeutic: continue infusion at 1750 units/hr   recheck heparin level in 6 hours to confirm  If 2nd level is therapeutic level may be drawn once daily  F/U CBC in am  09/10, PharmD Clinical Pharmacist 05/19/2020 6:54 AM

## 2020-05-19 NOTE — Consult Note (Signed)
PHARMACY - TOTAL PARENTERAL NUTRITION CONSULT NOTE   Indication: severe malnutrition  Patient Measurements: Height: 5\' 7"  (170.2 cm) Weight: 68.3 kg (150 lb 9.2 oz) IBW/kg (Calculated) : 66.1 TPN AdjBW (KG): 68.3 Body mass index is 23.58 kg/m.  Assessment: 34 year old male with PMHx of EtOH abuse admitted with alcohol withdrawal, acute alcoholic pancreatitis, acute metabolic encephalopathy, ascites s/p paracentesis on 6/28 with 2.8 L fluid removed, chest pain, acute renal failure.   Glucose / Insulin: sSSI q6h  CBG 126 - 179 (required 5 units SSI) Electrolytes: now WNL Renal: SCr 1.75-->0.54-->1.42-->0.69 LFTs / TGs: AST/ALT: 43/20 U/L at baseline Prealbumin / albumin: albumin 1.6 g/dL (trending down) Intake / Output; MIVF: 3951/2851 mL, no MIVF GI Imaging: none since TPN began Surgeries / Procedures:  7/7: US-guided paracentesis: 3L removed  Central access: 05/16/20 TPN start date: 05/16/20  Nutritional Goals (per RD recommendation on 05/16/20): kCal: 1900 - 2200 / day, Protein: 95 - 105 grams/day, Fluid: 1900 - 2200 mL/day Goal TPN rate is 85 mL/hr (provides 105 g of protein and 2200 kcals per day)  Current Nutrition:  Regular Diet (remains too lethargic to consume much although improved somewhat)  Plan:   continue TPN to goal rate at 85 mL/hr at 1800  AA 51 g/L, dextrose 16%, lipids 32 g/L, 2140 mL total volume  Electrolytes in TPN  Cl:Ac 1:1,  Na: 100, K 40, Ca 5, Mg 15, phosphorous 10 (all mEq/L)  Increased Na, phosphorous, magnesium; decreased potassium  Add standard MVI, 100 mg thiamine, 1 mg folic acid and trace elements to TPN  continue Sensitive q6h SSI and adjust as needed   Monitor TPN labs daily until stable due to high risk for refeeding syndrome  07/17/20 05/19/2020,6:54 AM

## 2020-05-19 NOTE — Progress Notes (Signed)
Subjective: Patient more lethargic today but remains arousable.    Objective: Current vital signs: BP 126/85   Pulse (!) 106   Temp 98.4 F (36.9 C)   Resp (!) 25   Ht 5\' 7"  (1.702 m)   Wt 68.3 kg   SpO2 98%   BMI 23.58 kg/m  Vital signs in last 24 hours: Temp:  [97.4 F (36.3 C)-98.4 F (36.9 C)] 98.4 F (36.9 C) (07/10 0800) Pulse Rate:  [88-106] 106 (07/10 0800) Resp:  [19-25] 25 (07/10 0400) BP: (107-138)/(66-95) 126/85 (07/10 0800) SpO2:  [97 %-99 %] 98 % (07/10 0800)  Intake/Output from previous day: 07/09 0701 - 07/10 0700 In: 3951.9 [I.V.:2791.8; Blood:760; IV Piggyback:400.1] Out: 1101 [Urine:1100; Stool:1] Intake/Output this shift: No intake/output data recorded. Nutritional status:  Diet Order            Diet regular Room service appropriate? Yes; Fluid consistency: Thin  Diet effective now                 Neurologic Exam: Mental Status: Lethargic but arousable.  Dysarthric.  Able to follow simple commands with delay.   Cranial Nerves: II: Pupils equal, round, reactive to light and accommodation III,IV, VI: extra-ocular motions intact bilaterally V,VII: face symmetric Motor: Moves extremities weakly against gravity Sensory: Responds to noxious stimuli in all extremities   Lab Results: Basic Metabolic Panel: Recent Labs  Lab 05/15/20 0205 05/15/20 0205 05/15/20 2006 05/15/20 2006 05/16/20 0331 05/16/20 0331 05/17/20 0535 05/18/20 0517 05/19/20 0214  NA 136   < > 137  --  137  --  139 136 133*  132*  K 3.8   < > 3.6  --  3.9  --  3.6 3.6 4.6  4.5  CL 103   < > 103  --  102  --  106 105 101  101  CO2 24   < > 25  --  22  --  19* 26 25  25   GLUCOSE 132*   < > 147*  --  126*  --  139* 135* 145*  145*  BUN 20   < > 26*  --  29*  --  24* 15 13  13   CREATININE 0.78   < > 1.31*  --  1.42*  --  1.18 0.81 0.69  0.72  CALCIUM 6.1*   < > 5.2*   < > 5.3*   < > 5.5* 6.8* 7.7*  7.6*  MG 1.8  --   --   --  1.9  --  1.7 2.3 1.9  PHOS 3.7  --    --   --  4.7*  --  4.9* 4.0 3.4   < > = values in this interval not displayed.    Liver Function Tests: Recent Labs  Lab 05/15/20 0205 05/15/20 0205 05/15/20 2006 05/16/20 0331 05/17/20 0535 05/18/20 0517 05/19/20 0214  AST 39  --  39 36 43* 36  --   ALT 25  --  24 21 20 18   --   ALKPHOS 81  --  79 71 99 86  --   BILITOT 0.7  --  0.7 0.8 0.6 0.4  --   PROT 4.4*  --  4.1* 4.9* 4.3* 4.1*  --   ALBUMIN 1.5*   < > 1.4* 2.8* 1.8* 1.5* 1.6*   < > = values in this interval not displayed.   Recent Labs  Lab 05/14/20 0040 05/15/20 0205 05/16/20 0331 05/17/20 0535 05/18/20 1830  LIPASE  1,510* 1,141* 912* 337* 171*   Recent Labs  Lab 05/15/20 2006  AMMONIA 17    CBC: Recent Labs  Lab 05/13/20 0618 05/14/20 0040 05/15/20 2006 05/16/20 0331 05/17/20 0535 05/18/20 0032 05/18/20 0517 05/18/20 1830 05/19/20 0214  WBC 20.4*   < > 34.2*   < > 39.8* 28.8* 27.0* 19.2* 16.9*  NEUTROABS 17.5*  --  31.7*  --   --   --   --   --   --   HGB 10.5*   < > 9.1*   < > 7.8* 7.3* 7.0* 9.8* 9.7*  HCT 30.3*   < > 26.4*   < > 22.5* 21.7* 20.0* 28.6* 28.0*  MCV 94.4   < > 95.0   < > 94.9 96.4 93.0 90.2 90.3  PLT 246   < > 126*   < > 144* 125* 121* 113* 94*   < > = values in this interval not displayed.    Cardiac Enzymes: No results for input(s): CKTOTAL, CKMB, CKMBINDEX, TROPONINI in the last 168 hours.  Lipid Panel: Recent Labs  Lab 05/16/20 1230  TRIG 45    CBG: Recent Labs  Lab 05/18/20 1802 05/18/20 2215 05/19/20 0046 05/19/20 0605 05/19/20 1159  GLUCAP 126* 156* 148* 179* 165*    Microbiology: Results for orders placed or performed during the hospital encounter of 05/03/20  SARS Coronavirus 2 by RT PCR (hospital order, performed in [facility] hospital lab) Nasopharyngeal Nasopharyngeal Swab     Status: None   Collection Time: 05/03/20  8:25 PM   Specimen: Nasopharyngeal Swab  Result Value Ref Range Status   SARS Coronavirus 2 NEGATIVE NEGATIVE Final     Comment: (NOTE) SARS-CoV-2 target nucleic acids are NOT DETECTED.  The SARS-CoV-2 RNA is generally detectable in upper and lower respiratory specimens during the acute phase of infection. The lowest concentration of SARS-CoV-2 viral copies this assay can detect is 250 copies / mL. A negative result does not preclude SARS-CoV-2 infection and should not be used as the sole basis for treatment or other patient management decisions.  A negative result may occur with improper specimen collection / handling, submission of specimen other than nasopharyngeal swab, presence of viral mutation(s) within the areas targeted by this assay, and inadequate number of viral copies (<250 copies / mL). A negative result must be combined with clinical observations, patient history, and epidemiological information.  Fact Sheet for Patients:   BoilerBrush.com.cy  Fact Sheet for Healthcare Providers: https://pope.com/  This test is not yet approved or  cleared by the [place] FDA and has been authorized for detection and/or diagnosis of SARS-CoV-2 by FDA under an Emergency Use Authorization (EUA).  This EUA will remain in effect (meaning this test can be used) for the duration of the COVID-19 declaration under Section 564(b)(1) of the Act, 21 U.S.C. section 360bbb-3(b)(1), unless the authorization is terminated or revoked sooner.  Performed at [facility], [address] [place], [place] [address]   MRSA PCR Screening     Status: None   Collection Time: 05/03/20 11:57 PM   Specimen: Nasopharyngeal  Result Value Ref Range Status   MRSA by PCR NEGATIVE NEGATIVE Final    Comment:        The GeneXpert MRSA Assay (FDA approved for NASAL specimens only), is one component of a comprehensive MRSA colonization surveillance program. It is not intended to diagnose MRSA infection nor to guide or monitor treatment for MRSA  infections. Performed at [facility],  [address] [place], [place] [address]   Body fluid culture     Status: None   Collection Time: 05/07/20 12:00 PM   Specimen: PATH Cytology Peritoneal fluid  Result Value Ref Range Status   Specimen Description   Final    PERITONEAL Performed at [facility], [address] [place], [place] [address]    Special Requests   Final    PERITONEAL Performed at [facility], [address] [place], [place] [address]    Gram Stain   Final    WBC PRESENT,BOTH PMN AND MONONUCLEAR NO ORGANISMS SEEN CYTOSPIN SMEAR    Culture   Final    NO GROWTH 3 DAYS Performed at [facility], [address] [place], [place] [address]    Report Status 05/11/2020 FINAL  Final  Culture, blood (single) w Reflex to ID Panel     Status: None   Collection Time: 05/11/20  7:59 AM   Specimen: BLOOD  Result Value Ref Range Status   Specimen Description BLOOD LEFT WRIST  Final   Special Requests   Final    BOTTLES DRAWN AEROBIC AND ANAEROBIC Blood Culture adequate volume   Culture   Final    NO GROWTH 5 DAYS Performed at [facility], [address] [place], [place] [address]    Report Status 05/16/2020 FINAL  Final  CULTURE, BLOOD (ROUTINE X 2) w Reflex to ID Panel     Status: None (Preliminary result)   Collection Time: 05/15/20 11:52 PM   Specimen: Left Antecubital; Blood  Result Value Ref Range Status   Specimen Description LEFT ANTECUBITAL  Final   Special Requests   Final    BOTTLES DRAWN AEROBIC AND ANAEROBIC Blood Culture adequate volume   Culture   Final    NO GROWTH 3 DAYS Performed at [facility], [address], [place], [place] [address]    Report Status PENDING  Incomplete  CULTURE, BLOOD (ROUTINE X 2) w Reflex to ID Panel     Status: None (Preliminary result)   Collection Time: 05/15/20 11:53 PM   Specimen: BLOOD LEFT HAND  Result Value Ref Range Status   Specimen Description BLOOD  LEFT HAND  Final   Special Requests   Final    BOTTLES DRAWN AEROBIC AND ANAEROBIC Blood Culture adequate volume   Culture   Final    NO GROWTH 3 DAYS Performed at [facility], [address] [place], [place] [address]    Report Status PENDING  Incomplete  MRSA PCR Screening     Status: None   Collection Time: 05/16/20 12:24 AM   Specimen: Nasopharyngeal  Result Value Ref Range Status   MRSA by PCR NEGATIVE NEGATIVE Final    Comment:        The GeneXpert MRSA Assay (FDA approved for NASAL specimens only), is one component of a comprehensive MRSA colonization surveillance program. It is not intended to diagnose MRSA infection nor to guide or monitor treatment for MRSA infections. Performed at [facility], [address] [place], [place] [address]   Urine Culture     Status: None   Collection Time: 05/16/20  3:27 AM   Specimen: Urine, Random  Result Value Ref Range Status   Specimen Description   Final    URINE, RANDOM Performed at [facility], [address] [place], [place] [address]    Special Requests   Final    NONE Performed at [facility],  [address] [place], [place] [address]    Culture   Final    NO GROWTH Performed at [facility], [address] [place], [place] [address]    Report Status 05/17/2020 FINAL  Final  Gastrointestinal Panel by PCR , Stool     Status: None   Collection Time: 05/17/20  3:06 PM   Specimen: Stool  Result Value Ref Range Status   Campylobacter species NOT DETECTED NOT DETECTED Final   Plesimonas shigelloides NOT DETECTED NOT DETECTED Final   Salmonella species NOT DETECTED NOT DETECTED Final   Yersinia enterocolitica NOT DETECTED NOT DETECTED Final   Vibrio species NOT DETECTED NOT DETECTED Final   Vibrio cholerae NOT DETECTED NOT DETECTED Final   Enteroaggregative E coli (EAEC) NOT DETECTED NOT DETECTED Final   Enteropathogenic E coli (EPEC) NOT DETECTED NOT DETECTED Final    Enterotoxigenic E coli (ETEC) NOT DETECTED NOT DETECTED Final   Shiga like toxin producing E coli (STEC) NOT DETECTED NOT DETECTED Final   Shigella/Enteroinvasive E coli (EIEC) NOT DETECTED NOT DETECTED Final   Cryptosporidium NOT DETECTED NOT DETECTED Final   Cyclospora cayetanensis NOT DETECTED NOT DETECTED Final   Entamoeba histolytica NOT DETECTED NOT DETECTED Final   Giardia lamblia NOT DETECTED NOT DETECTED Final   Adenovirus F40/41 NOT DETECTED NOT DETECTED Final   Astrovirus NOT DETECTED NOT DETECTED Final   Norovirus GI/GII NOT DETECTED NOT DETECTED Final   Rotavirus A NOT DETECTED NOT DETECTED Final   Sapovirus (I, II, IV, and V) NOT DETECTED NOT DETECTED Final    Comment: Performed at [facility], [address] [place], [place] [address]  C Difficile Quick Screen (NO PCR Reflex)     Status: None   Collection Time: 05/17/20  3:06 PM   Specimen: STOOL  Result Value Ref Range Status   C Diff antigen NEGATIVE NEGATIVE Final   C Diff toxin NEGATIVE NEGATIVE Final   C Diff interpretation No C. difficile detected.  Final    Comment: Performed at [facility], [address] [place], [place] [address]    Coagulation Studies: No results for input(s): LABPROT, INR in the last 72 hours.  Imaging: No results found.  Medications:  I have reviewed the patient's current medications. Scheduled: . Chlorhexidine Gluconate Cloth  6 each Topical Daily  . Gerhardt's butt cream   Topical TID  . insulin aspart  0-9 Units Subcutaneous Q6H  . pantoprazole (PROTONIX) IV  40 mg Intravenous Q12H  . sodium chloride flush  3 mL Intravenous Q12H   Continuous: . sodium chloride Stopped (05/13/20 1628)  . heparin 1,750 Units/hr (05/19/20 0400)  . meropenem (MERREM) IV 1 g (05/19/20 0759)  . norepinephrine (LEVOPHED) Adult infusion Stopped (05/18/20 1526)  . TPN ADULT (ION) 85 mL/hr at 05/19/20 0400  . TPN ADULT (ION)      Assessment/Plan: 34 y.o.malewith a long  history of ETOH abuse. Abruptly stopped drinking the day before presentation (6/24) and became progressively confused. Was brought to the ED by his wife and while in the ED was witnessed to have a seizure. Patient has not had further seizures but remained confused and lethargic.MRI of the brain personally reviewed and reveals a possible cerebral venous thrombosis as well as abnormal diffusion in the right cerebellum, bilateral parietal lobes and left occipital pole. 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 Thyroid normal. EEGonly significant for slowing. B1 normal.Mental statusworsened on 7/6. Repeat MRI of the brain repeatedand showed nonew infarcts. No  evidence of hydrocephalus or hemorrhage.Paracentesis performed on 7/7. Patient with improved mental status initially after paracentesis but today more lethargic.  No evidence of seizure activity.    Recommendations: 1.Continue heparin since patient not reliably able to take po.      LOS: 16 days   Thana Farr, MD Neurology [phone] 05/19/2020  12:14 PM

## 2020-05-19 NOTE — Progress Notes (Signed)
PROGRESS NOTE    Shane Willadsen Sr.   TTS:[id number]  DOB: [birth year]  PCP: Lorelee Market, MD    DOA: 05/03/2020 LOS: 75   Brief Narrative   34 year old gentleman with no medical history, chronic alcoholism, has been drinking nonstop about 12 packs of beer daily since age of 26 presented to the ED on  05/03/2020 for evaluation of progressive confusion and lethargy.  He also had episode of unresponsiveness in the triage area.  As per patient's fianc, patient stopped drinking about 24 hours prior to arrival and started behaving altered, confused.  Also reported multiple episodes diarrhea, but no nausea or vomiting.    In the ED, he was ill-appearing, BP stable, tachycardic with heart rate 141, respirations 21, and afebrile.  Labs were notable for sodium 124, potassium was 2.8, creatinine 1.75, bilirubin 2.  Anion gap 19.  CK 700.  Chest x-ray was essentially normal.  CT head was normal.  CT abdomen pelvis was consistent with pancreatitis with peripancreatic edema and multiple lesions that are most likely pseudocysts, moderate ascites with mild fatty  infiltration of liver, small BILATERAL pleural effusions and minimal bibasilar atelectasis (other non-pertinent findings in report).   Admitted to the hospitalist service for further evaluation and management on acute alcohol withdrawal syndrome, acute pancreatitis and possible SBP.     Assessment & Plan   Principal Problem:   Acute alcoholic pancreatitis Active Problems:   Alcohol withdrawal seizure with delirium (HCC)   Hyponatremia   Hypokalemia   AKI (acute kidney injury) ([place])   Elevated CK   Heme positive stool   Thrombocytopenia (HCC)   Acute metabolic encephalopathy   Severe protein-calorie malnutrition (HCC)   Sinus tachycardia   Ascites due to alcoholic hepatitis  Septic shock with hypothermia and hypotension - now in stepdown/ICU requiring Levophed (wean off as able), transferred overnight 7/6-7.  PCCM following.  Maintain  MAP >65.  Further mgmt as below.  Acute alcoholic pancreatitis -present on admission with abdominal pain, tachycardia, diarrhea, hypotension.  No gallbladder abnormalities on CT abdomen.  No longer complains of abdominal pain, only distention.   Worsening leukocytosis 20 >> 26 >> 40k >> 16.9 .  Lipase continues to improve (912 -> 337-> 171).  Continue meropenem.  Monitor abdominal exam closely.  Continue supportive care.     Severe Protein Calorie Malnutrition - POA, due to chronic alcohol abuse and not eating.  Minimal PO intake during admission.  Dietician following.  Family opted no tube feeding (per palliative discussion on 7/8).  TPN started on 7/8.  Daily CMP, Mg, Phos.  High risk of refeeding syndrome.  Overall difficult situation.  He ate some yogurt on 7/9.,  Encouraged oral nutrition as well  Goals of care - had discussion 7/5 with wife and her mother at bedside regarding unclear prognosis at this time.  They agree to palliative consult.  Do want to pursue artificial feeding.  Otherwise continue full scope of care at this time.   7/8: Palliative care met with Fiance, Fiance's mother as well as friend Carloyn Manner on 7/8. Patient is not quite ready for full comfort care and family would like to treat the treatable.  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  Neurology following.  Heparin infusion -may switch to Eliquis per neuro.  Closely monitor for bleeding, CBC's.  Neuro-checks.  Repeat head CT 7/5 ruled out hemorrhage or hydrocephalus.  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  Neurology following.  Repeat CT 7/5 as above.  Generalized weakness - severe.  Due to chronic alcoholism and malnutrition as well as prolonged acute illness.  He is requiring heavy  assistance with bed mobility and standing.   Initial PT recommendation is SNF.  TOC following.  If he survives this admission, CIR may be needed.  Acute metabolic encephalopathy - waxes and wanes, having hallucinations, agitation at times.  Lethargy has resolved.  No longer receiving benzos for alcohol withdrawal.  CT head upon admission was negative for acute findings that showed generalized atrophy advanced for his age.  Exam is non-focal.  Ammonia level normal x 2.  B12 normal. Neurology following.  EEG consistent with encephalopathy, no epileptiform activity.   Mild TSH elevation but normal T4.  Repeat head CT is neg.   Hematuria - new 7/3, in addition to pt reporting dysuria.  UA negative.  Appears resolved at this time.  Diarrhea - present on admission, persistent.  No fevers and leukocytosis likely due to pancreatitis, very unlikely infectious. Suspect malabsorption due to pancreatitis.  Leukocytosis -improving.  Likely due to pancreatitis, no other evidence of infection.  UA negative.  No respiratory symptoms.  Monitor closely.  On empiric Zosyn (started 7/4) given persistent high wbc, concern for developing pancreatic pseudocyst infection or SBP switched to meropenem on 7/7.  Follow blood cultures, no growth so far. GI panel and C. difficile neg, ID following  Sinus tachycardia -likely due to ongoing medical etiology but can also be due to Levophed, will try to wean off pressors. persistently elevated HR despite being past alcohol withdrawal and pain better from pancreatitis.  Ascites fluid cultures negative to date. Blood cultures negative to date.  No respiratory or urinary symptoms.    Ascites - his liver has mild fatty infiltration on imaging, not cirrhotic at this point, this is likely due to pancreatitis and hypoalbuminemia.  Underwent paracentesis on 6/28 with 2.8 L fluid removed. Ordered repeat paracentesis (7/1) due to increased distention, but not enough fluid seen on ultrasound  to do safely.  Large volume ascites seen on MRI abdomen (7/3), Repeat paracentesis on 7/7 with 3 L of dark serous fluid removed.  Continue empiric meropenem.  Elevated TSH - free T4 normal.    Chest pain - Resolved.  Reported by pt on AM of 7/1.  Troponin mildly elevated at 32, likely demand ischemia from persistent tachycardia in setting of alcohol withdrawal.  No acute ischemic changes on EKG, only non-specific T wave changes.  Monitor.  Hypervolemic hyponatremia -POA with sodium 124.  Resolved.  Suspect due to liver disease.  Monitor BMP.  Improving with diuretics.    Acute renal failure -Resolved.  Present on admission with creatinine 1.75.  Likely prerenal azotemia in the setting of above.  Improved with IV hydration.  Monitor BMP and urine output.  Hypokalemia -present on admission, replaced.  Monitor BMP, Mg, Phos and replace as needed.  Acute anemia: Likely blood loss (15.3->7.0->9.7), unable to get any luminal evaluation due to his critical illness manage conservatively -status post 2 units of packed red blood cell transfusion on 7/9 Case discussed with GI who agrees with conservative management for now  Patient BMI: Body mass index is 23.58 kg/m.   DVT prophylaxis: SCDs Start: 05/03/20 2153   Diet:  Diet Orders (From admission, onward)  Start     Ordered   05/15/20 1035  Diet regular Room service appropriate? Yes; Fluid consistency: Thin  Diet effective now       Comments: Please give 3 milk cartons with each meal.  Question Answer Comment  Room service appropriate? Yes   Fluid consistency: Thin      05/15/20 1035            Code Status: DNR    Subjective 05/19/20   No new complaints, sleepy, tachycardic Disposition Plan & Communication   Status is: Inpatient  Remains inpatient appropriate because:Inpatient level of care appropriate due to severity of illness   Dispo: The patient is from: Home              Anticipated d/c is to: SNF               Anticipated d/c date is: > 3 days                Patient currently is not medically stable to d/c.   Family Communication: wife at bedside during encounter    Consults, Procedures, Significant Events   Consultants:   Neurology  Gastroenterology  General surgery  Palliative care  PCCM   Procedures:   Paracentesis 05/07/2020, 05/16/2020  Antimicrobials:   Meropenem (6/28 to 7/1)  Zosyn 7/4 >>    Objective   Vitals:   05/19/20 0400 05/19/20 0500 05/19/20 0700 05/19/20 0800  BP: 138/78 125/81 129/80 126/85  Pulse:  100 (!) 106 (!) 106  Resp: (!) 25     Temp:    98.4 F (36.9 C)  TempSrc:      SpO2:  98% 97% 98%  Weight:      Height:        Intake/Output Summary (Last 24 hours) at 05/19/2020 0850 Last data filed at 05/19/2020 0500 Gross per 24 hour  Intake 3424.37 ml  Output 1101 ml  Net 2323.37 ml   Filed Weights   05/03/20 2232 05/16/20 0030  Weight: 63.5 kg 68.3 kg    Physical Exam:  General exam: awake, alert, no acute distress, underweight, chronically ill appearing HEENT: very dry mucus membranes, hearing grossly normal, pale lips  Respiratory system: shallow inspirations, CTAB, normal respiratory effort, no wheezes or rhonchi. Cardiovascular system: normal S1/S2, RRR, 2+ LE edema worse in the feet.   Gastrointestinal system: tensely distended but not tender, active bowel sounds, FMS in place with light brown liquid stool in bag. Central nervous system: Alert, oriented x 3, very soft vocal projection. No gross focal neurologic deficits.   Extremities: edematous feet, no cyanosis, moves all extremities Skin: dry, intact, normal temperature, pale Psychiatric: normal mood with periods of anxiety, congruent affect  Labs   Data Reviewed: I have personally reviewed following labs and imaging studies  CBC: Recent Labs  Lab 05/13/20 0618 05/14/20 0040 05/15/20 2006 05/16/20 0331 05/17/20 0535 05/18/20 0032 05/18/20 0517 05/18/20 1830  05/19/20 0214  WBC 20.4*   < > 34.2*   < > 39.8* 28.8* 27.0* 19.2* 16.9*  NEUTROABS 17.5*  --  31.7*  --   --   --   --   --   --   HGB 10.5*   < > 9.1*   < > 7.8* 7.3* 7.0* 9.8* 9.7*  HCT 30.3*   < > 26.4*   < > 22.5* 21.7* 20.0* 28.6* 28.0*  MCV 94.4   < > 95.0   < > 94.9 96.4 93.0 90.2 90.3  PLT 246   < > 126*   < > 144* 125* 121* 113* 94*   < > = values in this interval not displayed.   Basic Metabolic Panel: Recent Labs  Lab 05/15/20 0205 05/15/20 0205 05/15/20 2006 05/16/20 0331 05/17/20 0535 05/18/20 0517 05/19/20 0214  NA 136   < > 137 137 139 136 133*   132*  K 3.8   < > 3.6 3.9 3.6 3.6 4.6   4.5  CL 103   < > 103 102 106 105 101   101  CO2 24   < > 25 22 19* '26 25   25  ' GLUCOSE 132*   < > 147* 126* 139* 135* 145*   145*  BUN 20   < > 26* 29* 24* '15 13   13  ' CREATININE 0.78   < > 1.31* 1.42* 1.18 0.81 0.69   0.72  CALCIUM 6.1*   < > 5.2* 5.3* 5.5* 6.8* 7.7*   7.6*  MG 1.8  --   --  1.9 1.7 2.3 1.9  PHOS 3.7  --   --  4.7* 4.9* 4.0 3.4   < > = values in this interval not displayed.   GFR: Estimated Creatinine Clearance: 121.6 mL/min (by C-G formula based on SCr of 0.69 mg/dL). Liver Function Tests: Recent Labs  Lab 05/15/20 0205 05/15/20 0205 05/15/20 2006 05/16/20 0331 05/17/20 0535 05/18/20 0517 05/19/20 0214  AST 39  --  39 36 43* 36  --   ALT 25  --  '24 21 20 18  ' --   ALKPHOS 81  --  79 71 99 86  --   BILITOT 0.7  --  0.7 0.8 0.6 0.4  --   PROT 4.4*  --  4.1* 4.9* 4.3* 4.1*  --   ALBUMIN 1.5*   < > 1.4* 2.8* 1.8* 1.5* 1.6*   < > = values in this interval not displayed.   Recent Labs  Lab 05/14/20 0040 05/15/20 0205 05/16/20 0331 05/17/20 0535 05/18/20 1830  LIPASE 1,510* 1,141* 912* 337* 171*   Recent Labs  Lab 05/12/20 1117 05/15/20 2006  AMMONIA <9* 17   Coagulation Profile: Recent Labs  Lab 05/13/20 0910 05/14/20 0040  INR 1.1 1.1   Cardiac Enzymes: No results for input(s): CKTOTAL, CKMB, CKMBINDEX, TROPONINI in the last 168  hours. BNP (last 3 results) No results for input(s): PROBNP in the last 8760 hours. HbA1C: No results for input(s): HGBA1C in the last 72 hours. CBG: Recent Labs  Lab 05/18/20 1138 05/18/20 1802 05/18/20 2215 05/19/20 0046 05/19/20 0605  GLUCAP 126* 126* 156* 148* 179*   Lipid Profile: Recent Labs    05/16/20 1230  TRIG 45   Thyroid Function Tests: Recent Labs    05/18/20 0517  FREET4 0.55*   Anemia Panel: No results for input(s): VITAMINB12, FOLATE, FERRITIN, TIBC, IRON, RETICCTPCT in the last 72 hours. Sepsis Labs: Recent Labs  Lab 05/15/20 2223 05/16/20 0327 05/17/20 0535 05/18/20 0517  PROCALCITON  --  1.84 1.37 0.84  LATICACIDVEN 2.0* 1.1  --   --     Recent Results (from the past 240 hour(s))  Culture, blood (single) w Reflex to ID Panel     Status: None   Collection Time: 05/11/20  7:59 AM   Specimen: BLOOD  Result Value Ref Range Status   Specimen Description BLOOD LEFT WRIST  Final   Special Requests   Final    BOTTLES DRAWN AEROBIC AND ANAEROBIC Blood Culture  adequate volume   Culture   Final    NO GROWTH 5 DAYS Performed at [facility], [address] [place], [place] [address]    Report Status 05/16/2020 FINAL  Final  CULTURE, BLOOD (ROUTINE X 2) w Reflex to ID Panel     Status: None (Preliminary result)   Collection Time: 05/15/20 11:52 PM   Specimen: Left Antecubital; Blood  Result Value Ref Range Status   Specimen Description LEFT ANTECUBITAL  Final   Special Requests   Final    BOTTLES DRAWN AEROBIC AND ANAEROBIC Blood Culture adequate volume   Culture   Final    NO GROWTH 3 DAYS Performed at [facility], [address] [place], [place] [address]    Report Status PENDING  Incomplete  CULTURE, BLOOD (ROUTINE X 2) w Reflex to ID Panel     Status: None (Preliminary result)   Collection Time: 05/15/20 11:53 PM   Specimen: BLOOD LEFT HAND  Result Value Ref Range Status   Specimen Description BLOOD LEFT HAND  Final    Special Requests   Final    BOTTLES DRAWN AEROBIC AND ANAEROBIC Blood Culture adequate volume   Culture   Final    NO GROWTH 3 DAYS Performed at [facility], [address] [place], [place] [address]    Report Status PENDING  Incomplete  MRSA PCR Screening     Status: None   Collection Time: 05/16/20 12:24 AM   Specimen: Nasopharyngeal  Result Value Ref Range Status   MRSA by PCR NEGATIVE NEGATIVE Final    Comment:        The GeneXpert MRSA Assay (FDA approved for NASAL specimens only), is one component of a comprehensive MRSA colonization surveillance program. It is not intended to diagnose MRSA infection nor to guide or monitor treatment for MRSA infections. Performed at [facility], [address], [place], [place] [address]   Urine Culture     Status: None   Collection Time: 05/16/20  3:27 AM   Specimen: Urine, Random  Result Value Ref Range Status   Specimen Description   Final    URINE, RANDOM Performed at [facility], [address] [place], [place] [address]    Special Requests   Final    NONE Performed at [facility], [address], [place], [place] [address]    Culture   Final    NO GROWTH Performed at [facility], [address], [place],  [address]    Report Status 05/17/2020 FINAL  Final  Gastrointestinal Panel by PCR , Stool     Status: None   Collection Time: 05/17/20  3:06 PM   Specimen: Stool  Result Value Ref Range Status   Campylobacter species NOT DETECTED NOT DETECTED Final   Plesimonas shigelloides NOT DETECTED NOT DETECTED Final   Salmonella species NOT DETECTED NOT DETECTED Final   Yersinia enterocolitica NOT DETECTED NOT DETECTED Final   Vibrio species NOT DETECTED NOT DETECTED Final   Vibrio cholerae NOT DETECTED NOT DETECTED Final   Enteroaggregative E coli (EAEC) NOT DETECTED NOT DETECTED Final   Enteropathogenic E coli (EPEC) NOT DETECTED NOT DETECTED Final    Enterotoxigenic E coli (ETEC) NOT DETECTED NOT DETECTED Final   Shiga like toxin producing E coli (STEC) NOT DETECTED NOT DETECTED Final   Shigella/Enteroinvasive E coli (EIEC) NOT DETECTED NOT DETECTED Final   Cryptosporidium NOT DETECTED NOT DETECTED Final   Cyclospora cayetanensis NOT DETECTED NOT DETECTED  Final   Entamoeba histolytica NOT DETECTED NOT DETECTED Final   Giardia lamblia NOT DETECTED NOT DETECTED Final   Adenovirus F40/41 NOT DETECTED NOT DETECTED Final   Astrovirus NOT DETECTED NOT DETECTED Final   Norovirus GI/GII NOT DETECTED NOT DETECTED Final   Rotavirus A NOT DETECTED NOT DETECTED Final   Sapovirus (I, II, IV, and V) NOT DETECTED NOT DETECTED Final    Comment: Performed at [facility], [address], [place], [place] [address]  C Difficile Quick Screen (NO PCR Reflex)     Status: None   Collection Time: 05/17/20  3:06 PM   Specimen: STOOL  Result Value Ref Range Status   C Diff antigen NEGATIVE NEGATIVE Final   C Diff toxin NEGATIVE NEGATIVE Final   C Diff interpretation No C. difficile detected.  Final    Comment: Performed at [facility], [address] [place],  [address]      Imaging Studies   No results found.   Medications   Scheduled Meds:  Chlorhexidine Gluconate Cloth  6 each Topical Daily   Gerhardt's butt cream   Topical TID   insulin aspart  0-9 Units Subcutaneous Q6H   pantoprazole (PROTONIX) IV  40 mg Intravenous Q12H   sodium chloride flush  3 mL Intravenous Q12H   Continuous Infusions:  sodium chloride Stopped (05/13/20 1628)   heparin 1,750 Units/hr (05/19/20 0400)   meropenem (MERREM) IV 1 g (05/19/20 0759)   norepinephrine (LEVOPHED) Adult infusion Stopped (05/18/20 1526)   TPN ADULT (ION) 85 mL/hr at 05/19/20 0400   TPN ADULT (ION)         LOS: 16 days    Time spent: 20 minutes with > 50% spent in coordination of care and direct patient contact.   Haani Bakula Manuella Ghazi, DO [facility]  05/19/2020, 8:50 AM    If 7PM-7AM, please contact night-coverage. How to contact the [facility] Attending or Consulting provider [place] or covering provider during after hours [place], for this patient?    1. Check the care team in [facility] and look for a) attending/consulting [facility] provider listed and b) the [facility] team listed 2. Log into [email or web address] and use [place]'s universal password to access. If you do not have the password, please contact the hospital operator. 3. Locate the [facility] provider you are looking for under [facility] and page to a number that you can be directly reached. 4. If you still have difficulty reaching the provider, please page the [facility] (Director on Call) for the Hospitalists listed on amion for assistance.

## 2020-05-19 NOTE — Consult Note (Signed)
ANTICOAGULATION CONSULT NOTE  Pharmacy Consult for Heparin infusion  Indication: cerebral venous thrombosis  Patient Measurements: Height: 5\' 7"  (170.2 cm) Weight: 68.3 kg (150 lb 9.2 oz) IBW/kg (Calculated) : 66.1  Vital Signs: Temp: 98.4 F (36.9 C) (07/10 0800) BP: 121/82 (07/10 1400) Pulse Rate: 100 (07/10 1400)  Labs: Recent Labs    05/17/20 0535 05/17/20 0718 05/18/20 0517 05/18/20 0752 05/18/20 1830 05/18/20 1830 05/19/20 0214 05/19/20 0955 05/19/20 1602  HGB 7.8*   < > 7.0*   < > 9.8*  --  9.7*  --   --   HCT 22.5*   < > 20.0*  --  28.6*  --  28.0*  --   --   PLT 144*   < > 121*  --  113*  --  94*  --   --   HEPARINUNFRC  --    < >  --    < > 0.28*   < > 0.25* 0.33 0.31  CREATININE 1.18  --  0.81  --   --   --  0.69   0.72  --   --    < > = values in this interval not displayed.    Estimated Creatinine Clearance: 121.6 mL/min (by C-G formula based on SCr of 0.69 mg/dL).   Medical History: Past Medical History:  Diagnosis Date   Alcohol use disorder, severe, dependence (HCC)    Assessment: Pharmacy consulted for heparin infusion dosing and monitoring for 34 yo male for cerebral venous thrombosis.   CT Head: "Confirmed thrombus within the confluence of sinuses and left transverse dural venous sinus. 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 No evidence of hemorrhagic conversion." H&H, platelets continue to trend down slightliy  07/05  0040 HL 0.20 subtherapeutic - rate increased to 1150 units/hr  07/05  1004 HL 0.70 supratherapeutic - rate decreased to 1000 units/hr 07/05 1804 HL < 0.10: inc to 1150 units/hr 07/06  0205 HL 0.32: no change 07/06  0757 HL 0.54: decreased to 900 units/hr 07/06  2200 HL 0.20: increased to 1100 units/hr 07/07  0802 HL 0.23: increased to 1150 units/hr 07/07  1812 HL 0.31: no change 07/08  0020 HL 0.32: no change 07/08  0718 HL 0.29: increased to 1200 units/hr 07/08  1611 HL 0.12 increase to 1350  units/hr   07/09  0032 HL 0.20:increase to 1450 units/hr   07/09  0752 HL 0.30:increase to 1550 units/hr   07/10 0214 HL 0.25: increase to 1750 units/hr 07/10 0955 HL 0.33: no change 07/10 1602 HL 0.31: no change  Goal of Therapy:  Heparin level 0.3-0.5 units/ml Monitor platelets by anticoagulation protocol: Yes   Plan:   Anti-Xa level therapeutic x2: continue infusion at 1750 units/hr   recheck heparin level with AM labs  F/U CBC in am  09/10, PharmD Clinical Pharmacist 05/19/2020 4:32 PM

## 2020-05-19 NOTE — Consult Note (Signed)
ANTICOAGULATION CONSULT NOTE  Pharmacy Consult for Heparin infusion  Indication: cerebral venous thrombosis  Patient Measurements: Height: 5\' 7"  (170.2 cm) Weight: 68.3 kg (150 lb 9.2 oz) IBW/kg (Calculated) : 66.1  Vital Signs: Temp: 98.2 F (36.8 C) (07/09 2000) Temp Source: Oral (07/09 2000) BP: 112/67 (07/10 0300) Pulse Rate: 95 (07/10 0200)  Labs: Recent Labs    05/17/20 0535 05/17/20 07/18/20 05/18/20 0032 05/18/20 0517 05/18/20 0517 05/18/20 0752 05/18/20 1830 05/19/20 0214  HGB 7.8*   < >  --  7.0*   < >  --  9.8* 9.7*  HCT 22.5*   < >  --  20.0*  --   --  28.6* 28.0*  PLT 144*   < >  --  121*  --   --  113* 94*  HEPARINUNFRC  --    < >   < >  --   --  0.30 0.28* 0.25*  CREATININE 1.18  --   --  0.81  --   --   --   --    < > = values in this interval not displayed.    Estimated Creatinine Clearance: 120.1 mL/min (by C-G formula based on SCr of 0.81 mg/dL).   Medical History: Past Medical History:  Diagnosis Date  . Alcohol use disorder, severe, dependence (HCC)    Assessment: Pharmacy consulted for heparin infusion dosing and monitoring for 34 yo male for cerebral venous thrombosis.   CT Head: "Confirmed thrombus within the confluence of sinuses and left transverse dural venous sinus. 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 No evidence of hemorrhagic conversion." H&H continue to trend down, platelets have normalized  07/05  0040 HL 0.20 subtherapeutic - rate increased to 1150 units/hr  07/05  1004 HL 0.70 supratherapeutic - rate decreased to 1000 units/hr 07/05 1804 HL < 0.10: inc to 1150 units/hr 07/06  0205 HL 0.32: no change 07/06  0757 HL 0.54: decreased to 900 units/hr 07/06  2200 HL 0.20: increased to 1100 units/hr 07/07  0802 HL 0.23: increased to 1150 units/hr 07/07  1812 HL 0.31: no change 07/08  0020 HL 0.32: no change 07/08  0718 HL 0.29: increased to 1200 units/hr 07/08  1611 HL 0.12 increase to 1350 units/hr   07/09   0032 HL 0.20:increase to 1450 units/hr   07/09  0752 HL 0.30:increase to 1550 units/hr   07/10 0214 HL 0.25: increase to 1750 units/hr  Goal of Therapy:  Heparin level 0.3-0.5 units/ml Monitor platelets by anticoagulation protocol: Yes   Plan:   7/10 @0214  HL=0.25. Heparin level  subtherapeutic: increase to 1750 units/hr   recheck heparin level in 6 hours after rate change  F/U CBC in am  09/10, PharmD Clinical Pharmacist 05/19/2020 3:50 AM

## 2020-05-19 NOTE — Progress Notes (Signed)
Remains in NSR. Unchanged. Refused to eat. Pain medication x 1. Nausea medication x 1. Whined all day. Skin still in terrible shape with unstageable breakdown on his sacrum and bilateral heels.

## 2020-05-20 ENCOUNTER — Inpatient Hospital Stay: Payer: Medicaid Other

## 2020-05-20 ENCOUNTER — Encounter: Payer: Self-pay | Admitting: Internal Medicine

## 2020-05-20 LAB — RENAL FUNCTION PANEL
Albumin: 1.4 g/dL — ABNORMAL LOW (ref 3.5–5.0)
Anion gap: 6 (ref 5–15)
BUN: 14 mg/dL (ref 6–20)
CO2: 28 mmol/L (ref 22–32)
Calcium: 8.1 mg/dL — ABNORMAL LOW (ref 8.9–10.3)
Chloride: 103 mmol/L (ref 98–111)
Creatinine, Ser: 0.58 mg/dL — ABNORMAL LOW (ref 0.61–1.24)
GFR calc Af Amer: >60 mL/min (ref >60)
GFR calc non Af Amer: >60 mL/min (ref >60)
Glucose, Bld: 149 mg/dL — ABNORMAL HIGH (ref 70–99)
Phosphorus: 3.9 mg/dL (ref 2.5–4.6)
Potassium: 4.9 mmol/L (ref 3.5–5.1)
Sodium: 137 mmol/L (ref 135–145)

## 2020-05-20 LAB — CBC
HCT: 24.3 % — ABNORMAL LOW (ref 39.0–52.0)
Hemoglobin: 8.1 g/dL — ABNORMAL LOW (ref 13.0–17.0)
MCH: 30.6 pg (ref 26.0–34.0)
MCHC: 33.3 g/dL (ref 30.0–36.0)
MCV: 91.7 fL (ref 80.0–100.0)
Platelets: 77 10*3/uL — ABNORMAL LOW (ref 150–400)
RBC: 2.65 MIL/uL — ABNORMAL LOW (ref 4.22–5.81)
RDW: 15.6 % — ABNORMAL HIGH (ref 11.5–15.5)
WBC: 15.9 10*3/uL — ABNORMAL HIGH (ref 4.0–10.5)
nRBC: 0 % (ref 0.0–0.2)

## 2020-05-20 LAB — GLUCOSE, CAPILLARY
Glucose-Capillary: 129 mg/dL — ABNORMAL HIGH (ref 70–99)
Glucose-Capillary: 137 mg/dL — ABNORMAL HIGH (ref 70–99)
Glucose-Capillary: 143 mg/dL — ABNORMAL HIGH (ref 70–99)

## 2020-05-20 LAB — MAGNESIUM: Magnesium: 1.7 mg/dL (ref 1.7–2.4)

## 2020-05-20 LAB — HEPARIN LEVEL (UNFRACTIONATED): Heparin Unfractionated: 0.32 IU/mL (ref 0.30–0.70)

## 2020-05-20 IMAGING — CT CT VENOGRAM HEAD
2 of 9 series · 16 of 47 positions shown · IV contrast (APPLIED)
Comparison: Multiple previous studies beginning [DATE].

CLINICAL DATA: Follow-up dural venous thrombosis. Subacute
infarctions right posterior parietal lobe and right cerebellum.

EXAM:
CT VENOGRAM HEAD
TECHNIQUE: CT venography performed and compared with previous studies beginning
[DATE].
CONTRAST:  75mL OMNIPAQUE IOHEXOL 350 MG/ML SOLN

[Series 4: cor soft · coronal · 0.28mm/px · 1 of 65 slices shown]
[im 33/65  brain]
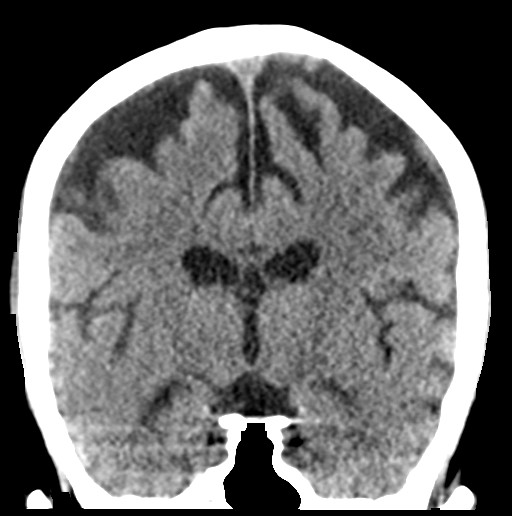

[Series 6: head w · axial · 0.47mm/px · z∈[-136,-10]mm · 15 of 73 slices shown]
[im 5/73  brain]
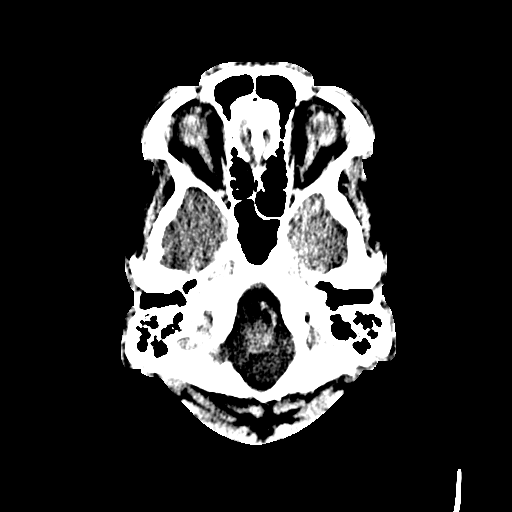
[im 10/73  bone]
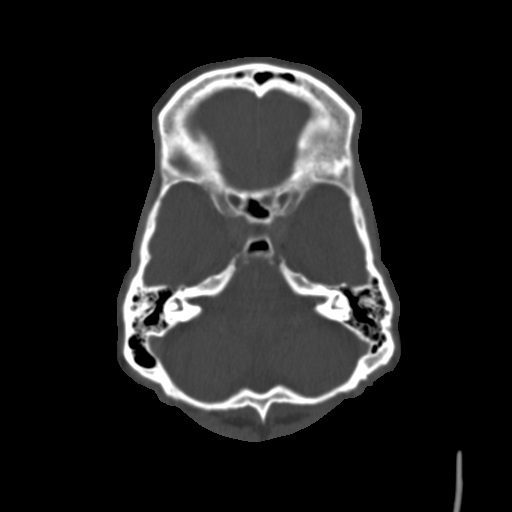
[im 14/73  brain]
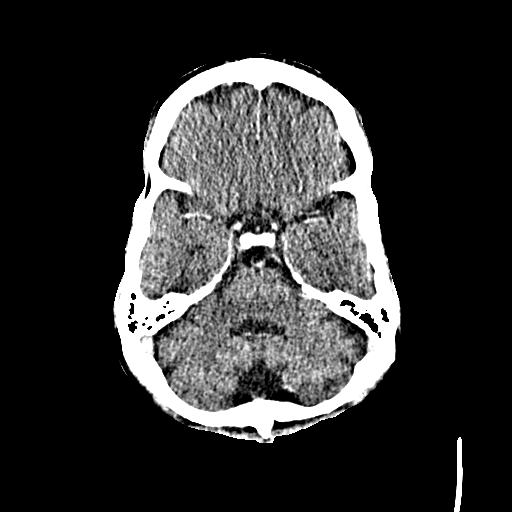
[im 19/73  bone]
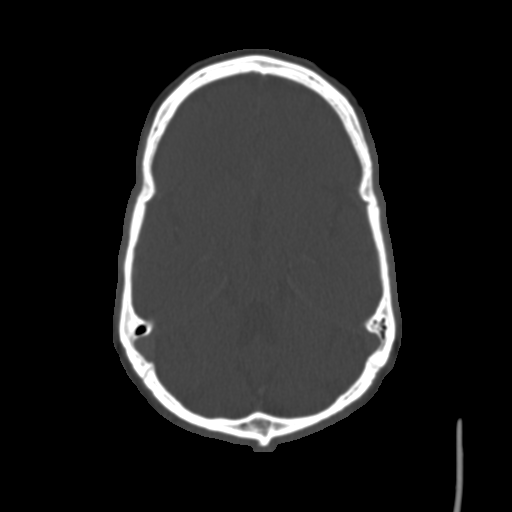
[im 23/73  brain]
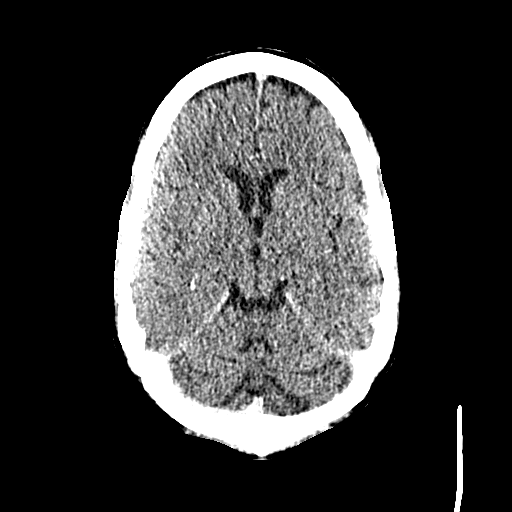
[im 28/73  bone]
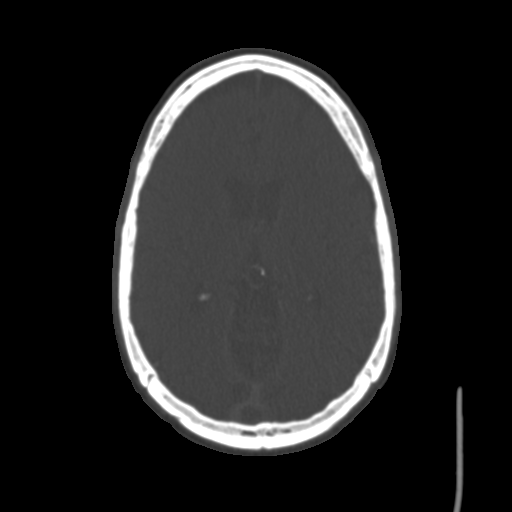
[im 32/73  brain]
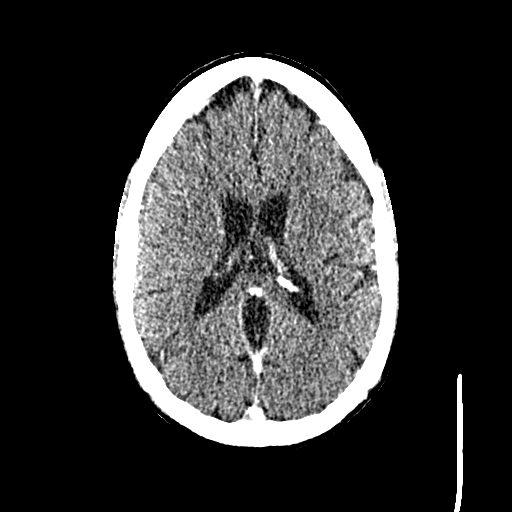
[im 37/73  bone]
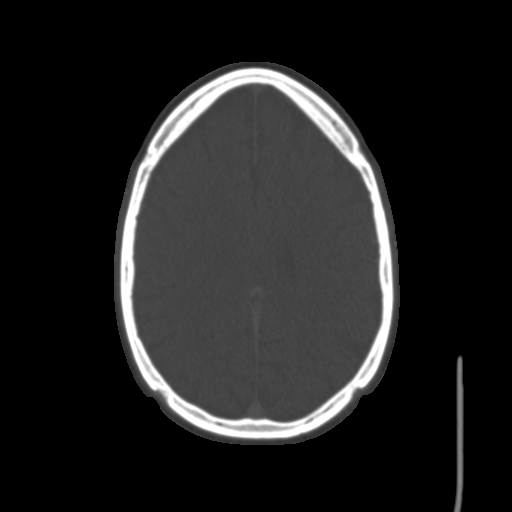
[im 41/73  brain]
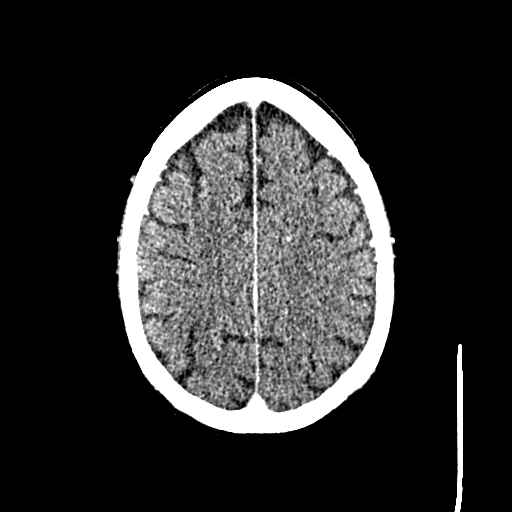
[im 46/73  bone]
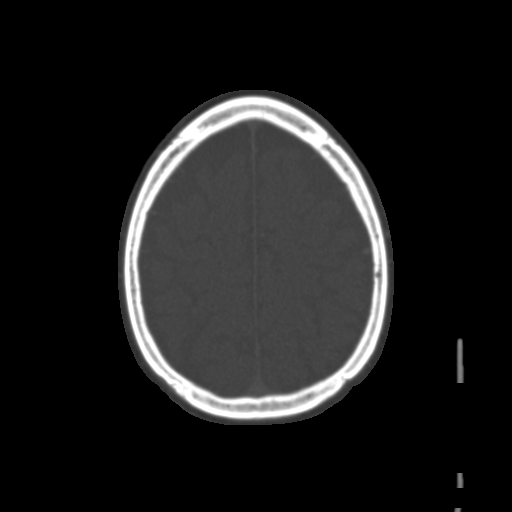
[im 50/73  brain]
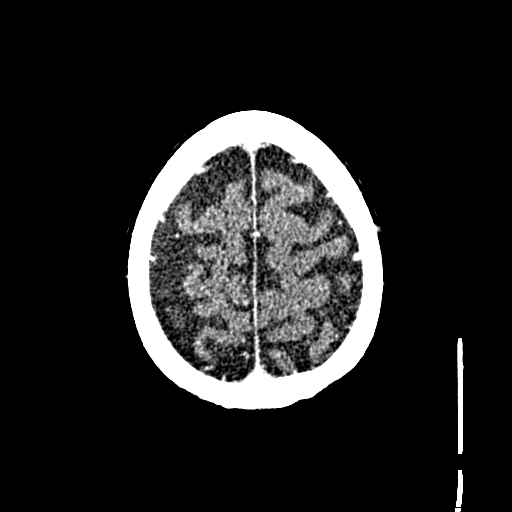
[im 55/73  bone]
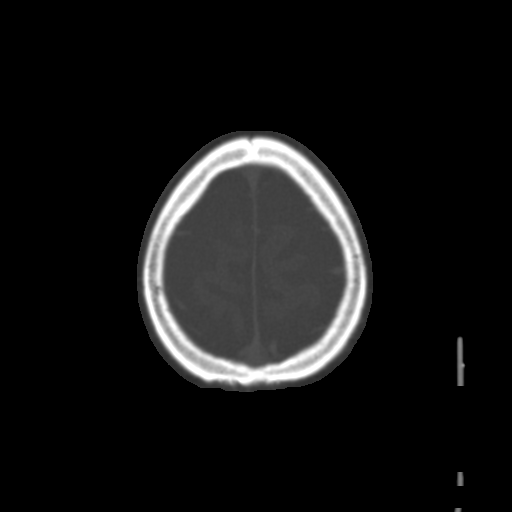
[im 59/73  brain]
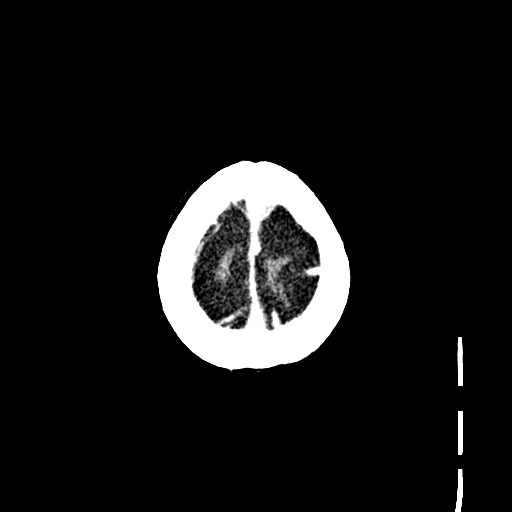
[im 64/73  bone]
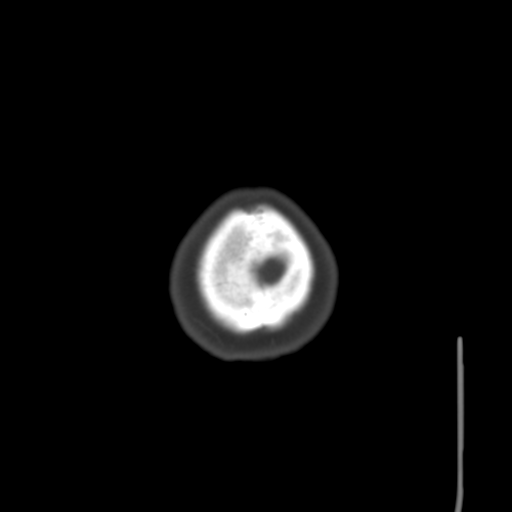
[im 68/73  brain]
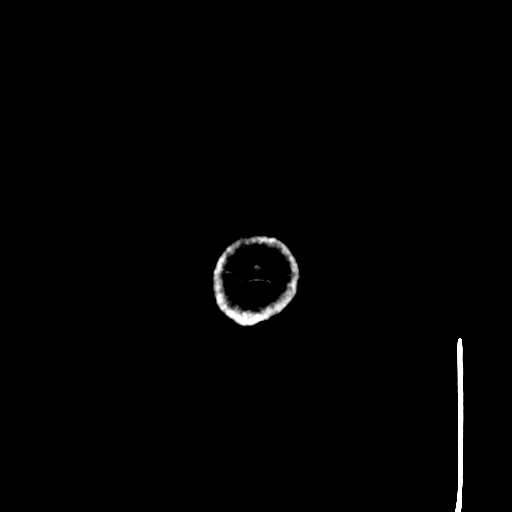

[16 of 47 positions shown; findings below may reference images not displayed]

FINDINGS: Subtle low density in the right cerebellum at the site recent acute
infarction. Subtle loss of gray-white differentiation in the right
parietal region consistent with the area of acute cortical
infarction. No swelling or hemorrhage. No mass, hydrocephalus or
extra-axial collection.

Superior sagittal sinus remains patent to the torcula where there is
residual intraluminal thrombus as seen previously. Thrombosis of the
left transverse sinus with a small amount of peripheral flow as seen
previously. Sigmoid sinus and jugular vein on the left appear
normal. Right transverse sinus, sigmoid sinus and jugular vein are
patent.
IMPRESSION: Similar appearance with intraluminal thrombus in the torcula and
left transverse sinus. There is a small amount of peripheral flow
around thrombus.

Subtle low density in the right cerebellum and at the gray-white
junction of the right parietal lobe consistent with the areas acute
infarction shown by MRI.

## 2020-05-20 MED ORDER — LIDOCAINE VISCOUS HCL 2 % MT SOLN
15.0000 mL | Freq: Once | OROMUCOSAL | Status: AC
  Administered 2020-05-20: 15 mL via ORAL
  Filled 2020-05-20: qty 15

## 2020-05-20 MED ORDER — MAGIC MOUTHWASH W/LIDOCAINE: 5.0000 mL | Freq: Three times a day (TID) | ORAL | Status: DC | PRN

## 2020-05-20 MED ORDER — IOHEXOL 350 MG/ML SOLN
75.0000 mL | Freq: Once | INTRAVENOUS | Status: AC | PRN
  Administered 2020-05-20: 75 mL via INTRAVENOUS

## 2020-05-20 MED ORDER — MAGIC MOUTHWASH
5.0000 mL | Freq: Three times a day (TID) | ORAL | Status: DC | PRN
  Filled 2020-05-20: qty 10

## 2020-05-20 MED ORDER — ALUM & MAG HYDROXIDE-SIMETH 200-200-20 MG/5ML PO SUSP
30.0000 mL | Freq: Once | ORAL | Status: AC
  Administered 2020-05-20: 30 mL via ORAL
  Filled 2020-05-20: qty 30

## 2020-05-20 MED ORDER — TRAVASOL 10 % IV SOLN
INTRAVENOUS | Status: AC
  Filled 2020-05-20: qty 1040.4

## 2020-05-20 MED ORDER — LIDOCAINE VISCOUS HCL 2 % MT SOLN
5.0000 mL | Freq: Three times a day (TID) | OROMUCOSAL | Status: DC | PRN
  Filled 2020-05-20: qty 15

## 2020-05-20 NOTE — Consult Note (Signed)
PHARMACY - TOTAL PARENTERAL NUTRITION CONSULT NOTE   Indication: severe malnutrition  Patient Measurements: Height: 5\' 7"  (170.2 cm) Weight: 68.3 kg (150 lb 9.2 oz) IBW/kg (Calculated) : 66.1 TPN AdjBW (KG): 68.3 Body mass index is 23.58 kg/m.  Assessment: 34 year old male with PMHx of EtOH abuse admitted with alcohol withdrawal, acute alcoholic pancreatitis, acute metabolic encephalopathy, ascites s/p paracentesis on 6/28 with 2.8 L fluid removed, chest pain, acute renal failure.   Glucose / Insulin: sSSI q6h  CBG 143 - 165 (required 7 units SSI) Electrolytes: now WNL Trending up: potassium, phosphorous, sodium trending down: magnesium Renal: SCr 1.75-->0.54-->1.42-->0.58 LFTs / TGs: AST/ALT: 43/20 U/L at baseline Prealbumin / albumin: albumin 1.4 g/dL (trending down) Intake / Output; MIVF: 1730/1000 mL, no MIVF GI Imaging: none since TPN began Surgeries / Procedures:  7/7: US-guided paracentesis: 3L removed  Central access: 05/16/20 TPN start date: 05/16/20  Nutritional Goals (per RD recommendation on 05/16/20): kCal: 1900 - 2200 / day, Protein: 95 - 105 grams/day, Fluid: 1900 - 2200 mL/day Goal TPN rate is 85 mL/hr (provides 105 g of protein and 2200 kcals per day)  Current Nutrition:  Regular Diet (remains too lethargic to consume with a contunued poor appetite)  Plan:   continue TPN to goal rate at 85 mL/hr at 1800  AA 51 g/L, dextrose 16%, lipids 32 g/L, 2140 mL total volume  Electrolytes in TPN  Cl:Ac 1:1,  Na: 100, K 30, Ca 5, Mg 15, phosphorous 10 (all mEq/L)  decreased potassium, phosphorous, increased magnesium  Add standard MVI, 100 mg thiamine, 1 mg folic acid and trace elements to TPN  continue Sensitive q6h SSI and adjust as needed   Monitor TPN labs daily until stable due to high risk for refeeding syndrome  07/17/20 05/20/2020,7:01 AM

## 2020-05-20 NOTE — Consult Note (Signed)
ANTICOAGULATION CONSULT NOTE  Pharmacy Consult for Heparin infusion  Indication: cerebral venous thrombosis  Patient Measurements: Height: 5\' 7"  (170.2 cm) Weight: 68.3 kg (150 lb 9.2 oz) IBW/kg (Calculated) : 66.1  Vital Signs: Temp: 100 F (37.8 C) (07/11 0400) Temp Source: Oral (07/11 0400) BP: 117/65 (07/11 0500) Pulse Rate: 115 (07/11 0500)  Labs: Recent Labs    05/18/20 0517 05/18/20 0752 05/18/20 1830 05/18/20 1830 05/19/20 0214 05/19/20 0214 05/19/20 0955 05/19/20 1602 05/20/20 0439  HGB 7.0*   < > 9.8*   < > 9.7*  --   --   --  8.1*  HCT 20.0*   < > 28.6*  --  28.0*  --   --   --  24.3*  PLT 121*   < > 113*  --  94*  --   --   --  77*  HEPARINUNFRC  --    < > 0.28*   < > 0.25*   < > 0.33 0.31 0.32  CREATININE 0.81  --   --   --  0.69  0.72  --   --   --  0.58*   < > = values in this interval not displayed.    Estimated Creatinine Clearance: 121.6 mL/min (A) (by C-G formula based on SCr of 0.58 mg/dL (L)).   Medical History: Past Medical History:  Diagnosis Date  . Alcohol use disorder, severe, dependence (HCC)    Assessment: Pharmacy consulted for heparin infusion dosing and monitoring for 34 yo male for cerebral venous thrombosis.   CT Head: "Confirmed thrombus within the confluence of sinuses and left transverse dural venous sinus. 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 No evidence of hemorrhagic conversion." H&H, platelets continue to trend down slightliy  07/05  0040 HL 0.20 subtherapeutic - rate increased to 1150 units/hr  07/05  1004 HL 0.70 supratherapeutic - rate decreased to 1000 units/hr 07/05 1804 HL < 0.10: inc to 1150 units/hr 07/06  0205 HL 0.32: no change 07/06  0757 HL 0.54: decreased to 900 units/hr 07/06  2200 HL 0.20: increased to 1100 units/hr 07/07  0802 HL 0.23: increased to 1150 units/hr 07/07  1812 HL 0.31: no change 07/08  0020 HL 0.32: no change 07/08  0718 HL 0.29: increased to 1200  units/hr 07/08  1611 HL 0.12 increase to 1350 units/hr   07/09  0032 HL 0.20:increase to 1450 units/hr   07/09  0752 HL 0.30:increase to 1550 units/hr   07/10 0214 HL 0.25: increase to 1750 units/hr 07/10 0955 HL 0.33: no change 07/10 1602 HL 0.31: no change 07/11 0439 HL 0.32, therapeutic, CBC worse  Goal of Therapy:  Heparin level 0.3-0.5 units/ml Monitor platelets by anticoagulation protocol: Yes   Plan:   Anti-Xa level therapeutic x3: continue infusion at 1750 units/hr   recheck heparin level with AM labs  F/U CBC in am  09/11, PharmD Clinical Pharmacist 05/20/2020 6:39 AM

## 2020-05-20 NOTE — Progress Notes (Signed)
PROGRESS NOTE    Shane Kellis Sr.   ZOX:[id number]  DOB: [birth year]  PCP: Lorelee Market, MD    DOA: 05/03/2020 LOS: 65   Brief Narrative   34 year old gentleman with no medical history, chronic alcoholism, has been drinking nonstop about 12 packs of beer daily since age of 27 presented to the ED on  05/03/2020 for evaluation of progressive confusion and lethargy.  He also had episode of unresponsiveness in the triage area.  As per patient's fianc, patient stopped drinking about 24 hours prior to arrival and started behaving altered, confused.  Also reported multiple episodes diarrhea, but no nausea or vomiting.    In the ED, he was ill-appearing, BP stable, tachycardic with heart rate 141, respirations 21, and afebrile.  Labs were notable for sodium 124, potassium was 2.8, creatinine 1.75, bilirubin 2.  Anion gap 19.  CK 700.  Chest x-ray was essentially normal.  CT head was normal.  CT abdomen pelvis was consistent with pancreatitis with peripancreatic edema and multiple lesions that are most likely pseudocysts, moderate ascites with mild fatty  infiltration of liver, small BILATERAL pleural effusions and minimal bibasilar atelectasis (other non-pertinent findings in report).   Admitted to the hospitalist service for further evaluation and management on acute alcohol withdrawal syndrome, acute pancreatitis and possible SBP.     Assessment & Plan   Principal Problem:   Acute alcoholic pancreatitis Active Problems:   Alcohol withdrawal seizure with delirium (HCC)   Hyponatremia   Hypokalemia   AKI (acute kidney injury) ([place])   Elevated CK   Heme positive stool   Thrombocytopenia (HCC)   Acute metabolic encephalopathy   Severe protein-calorie malnutrition (HCC)   Sinus tachycardia   Ascites due to alcoholic hepatitis  Septic shock with hypothermia and hypotension - now in stepdown/ICU requiring Levophed (wean off as able), transferred overnight 7/6-7.  PCCM following.  Maintain  MAP >65.  Leukocytosis improving.  Continue meropenem  Acute alcoholic pancreatitis -present on admission.  No gallbladder abnormalities on CT abdomen.  Lipase continues to improve (912 -> 337-> 171).  Continue meropenem.  Monitor abdominal exam closely.  Continue supportive care.     Severe Protein Calorie Malnutrition - POA, due to chronic alcohol abuse and not eating.  Minimal PO intake during admission.  Dietician following.  Family opted no tube feeding (per palliative discussion on 7/8).  TPN started on 7/8.  Daily CMP, Mg, Phos.  High risk of refeeding syndrome.  Overall difficult situation. Encouraged oral nutrition as well  Goals of care - had discussion 7/5 with wife and her mother at bedside regarding unclear prognosis at this time.  They agree to palliative consult.  Do want to pursue artificial feeding.  Otherwise continue full scope of care at this time.   7/8: Palliative care met with Fiance, Fiance's mother as well as friend Carloyn Manner on 7/8. Patient is not quite ready for full comfort care and family would like to treat the treatable. 7/11-patient feels he is likely going to die and request to be discharged home.  I had discussion with patient's wife who is talking with other family members today to decide on keeping him comfortable and take home with hospice.  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  Neurology following.  Heparin infusion -may switch to Eliquis per neuro.  Closely monitor for bleeding, CBC's.  Neuro-checks.  Repeat head CT 7/5 ruled out hemorrhage or  hydrocephalus. 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  Neurology following.  Repeat CT  7/5 as above.  Generalized weakness - severe.  Due to chronic alcoholism and malnutrition as well as prolonged acute illness.  He is requiring heavy assistance with bed mobility and standing.   Initial PT recommendation is SNF.  TOC following.  If he survives this admission, CIR may be needed.  Acute metabolic encephalopathy - waxes and wanes, having hallucinations, agitation at times.  Lethargy has resolved.  No longer receiving benzos for alcohol withdrawal.  CT head upon admission was negative for acute findings that showed generalized atrophy advanced for his age.  Exam is non-focal.  Ammonia level normal x 2.  B12 normal. Neurology following.  EEG consistent with encephalopathy, no epileptiform activity.   Mild TSH elevation but normal T4.  Repeat head CT is neg.  another CT venogram scheduled for today  Hematuria - new 7/3, in addition to pt reporting dysuria.  UA negative.  Appears resolved at this time.  Diarrhea - present on admission, persistent.  No fevers and leukocytosis likely due to pancreatitis, very unlikely infectious. Suspect malabsorption due to pancreatitis.  Leukocytosis -improving.  Likely due to pancreatitis, no other evidence of infection.  UA negative.  No respiratory symptoms.  Monitor closely.  On empiric Zosyn (started 7/4) given persistent high wbc, concern for developing pancreatic pseudocyst infection or SBP switched to meropenem on 7/7.  Follow blood cultures, no growth so far. GI panel and C. difficile neg, ID following  Sinus tachycardia -likely due to ongoing medical etiology but can also be due to Levophed, will try to wean off pressors. persistently elevated HR despite being past alcohol withdrawal and pain better from pancreatitis.  Ascites fluid cultures negative to date. Blood cultures negative to date.  No respiratory or urinary symptoms.    Ascites - his liver has mild fatty infiltration on imaging, not cirrhotic at this point, this is likely due to  pancreatitis and hypoalbuminemia.  Underwent paracentesis on 6/28 with 2.8 L fluid removed. Ordered repeat paracentesis (7/1) due to increased distention, but not enough fluid seen on ultrasound to do safely.  Large volume ascites seen on MRI abdomen (7/3), Repeat paracentesis on 7/7 with 3 L of dark serous fluid removed.  Continue empiric meropenem.  Elevated TSH - free T4 normal.    Chest pain - Resolved.  Reported by pt on AM of 7/1.  Troponin mildly elevated at 32, likely demand ischemia from persistent tachycardia in setting of alcohol withdrawal.  No acute ischemic changes on EKG, only non-specific T wave changes.  Monitor.  Hypervolemic hyponatremia -POA with sodium 124.  Resolved.  Suspect due to liver disease.  Monitor BMP.  Improving with diuretics.    Acute renal failure -Resolved.  Present on admission with creatinine 1.75.  Likely prerenal azotemia in the setting of above.  Improved with IV hydration.  Monitor BMP and urine output.  Hypokalemia -present on admission, replaced.  Monitor BMP, Mg, Phos and replace as needed.  Acute blood loss anemia: (15.3->7.0->9.7->8.1), unable to get any luminal evaluation due to his critical illness manage conservatively -status post 2 units of packed red blood cell transfusion on 7/9 Case discussed with GI who agrees with conservative management for  now  Severe protein calorie malnutrition High risk for pressure ulcers as he is not able to move and now getting anasarca from severe protein calorie malnutrition.  Per nursing - unstageable breakdown on his sacrum and bilateral heels.  Patient BMI: Body mass index is 23.58 kg/m.   Patient is critically sick with multiorgan failure and is at very high risk for death either in the hospital or at discharge.  Highly recommend comfort care and hospice.  Very high chances of mortality expected.  Patient and Family well aware  DVT prophylaxis: SCDs Start: 05/03/20 2153   Diet:  Diet Orders (From  admission, onward)    Start     Ordered   05/15/20 1035  Diet regular Room service appropriate? Yes; Fluid consistency: Thin  Diet effective now       Comments: Please give 3 milk cartons with each meal.  Question Answer Comment  Room service appropriate? Yes   Fluid consistency: Thin      05/15/20 1035            Code Status: DNR  Patient is critically sick with multiorgan failure  Subjective 05/20/20   Lethargic but arousable, requesting to discharge him home.  He feels like he is going to die soon Disposition Plan & Communication   Status is: Inpatient  Remains inpatient appropriate because:Inpatient level of care appropriate due to severity of illness   Dispo: The patient is from: Home              Anticipated d/c is to: SNF              Anticipated d/c date is: > 3 days                Patient currently is not medically stable to d/c.   Family Communication: wife at bedside during encounter    Consults, Procedures, Significant Events   Consultants:   Neurology  Gastroenterology  General surgery  Palliative care  PCCM   Procedures:   Paracentesis 05/07/2020, 05/16/2020  Antimicrobials:   Meropenem >>    Objective   Vitals:   05/20/20 0400 05/20/20 0500 05/20/20 0800 05/20/20 1200  BP: 130/70 117/65  119/64  Pulse: (!) 113 (!) 115    Resp: (!) 23 (!) 23    Temp: 100 F (37.8 C)  99.5 F (37.5 C) 99.4 F (37.4 C)  TempSrc: Oral  Oral Oral  SpO2: 94% 94%    Weight:      Height:        Intake/Output Summary (Last 24 hours) at 05/20/2020 1448 Last data filed at 05/20/2020 0600 Gross per 24 hour  Intake 1730.45 ml  Output 1000 ml  Net 730.45 ml   Filed Weights   05/03/20 2232 05/16/20 0030  Weight: 63.5 kg 68.3 kg    Physical Exam:  General exam: awake, alert, no acute distress, cachectic, chronically ill appearing HEENT: very dry mucus membranes, hearing grossly normal, pale lips  Respiratory system: shallow inspirations, CTAB,  normal respiratory effort, no wheezes or rhonchi. Cardiovascular system: normal S1/S2, RRR, 2+ LE edema worse in the feet.   Gastrointestinal system: tensely distended but not tender, active bowel sounds, FMS in place with light brown liquid stool in bag. Central nervous system: Alert, oriented x 3, very soft vocal projection. No gross focal neurologic deficits.   Extremities: edematous feet, no cyanosis, moves all extremities Skin: dry, intact, normal temperature, pale.unstageable breakdown on his sacrum and bilateral heels. Psychiatric: normal mood with  periods of anxiety, congruent affect Anasarca seen  Labs   Data Reviewed: I have personally reviewed following labs and imaging studies  CBC: Recent Labs  Lab 05/15/20 2006 05/16/20 0331 05/18/20 0032 05/18/20 0517 05/18/20 1830 05/19/20 0214 05/20/20 0439  WBC 34.2*   < > 28.8* 27.0* 19.2* 16.9* 15.9*  NEUTROABS 31.7*  --   --   --   --   --   --   HGB 9.1*   < > 7.3* 7.0* 9.8* 9.7* 8.1*  HCT 26.4*   < > 21.7* 20.0* 28.6* 28.0* 24.3*  MCV 95.0   < > 96.4 93.0 90.2 90.3 91.7  PLT 126*   < > 125* 121* 113* 94* 77*   < > = values in this interval not displayed.   Basic Metabolic Panel: Recent Labs  Lab 05/16/20 0331 05/17/20 0535 05/18/20 0517 05/19/20 0214 05/20/20 0439  NA 137 139 136 133*  132* 137  K 3.9 3.6 3.6 4.6  4.5 4.9  CL 102 106 105 101  101 103  CO2 22 19* '26 25  25 28  ' GLUCOSE 126* 139* 135* 145*  145* 149*  BUN 29* 24* '15 13  13 14  ' CREATININE 1.42* 1.18 0.81 0.69  0.72 0.58*  CALCIUM 5.3* 5.5* 6.8* 7.7*  7.6* 8.1*  MG 1.9 1.7 2.3 1.9 1.7  PHOS 4.7* 4.9* 4.0 3.4 3.9   GFR: Estimated Creatinine Clearance: 121.6 mL/min (A) (by C-G formula based on SCr of 0.58 mg/dL (L)). Liver Function Tests: Recent Labs  Lab 05/15/20 0205 05/15/20 0205 05/15/20 2006 05/15/20 2006 05/16/20 0331 05/17/20 0535 05/18/20 0517 05/19/20 0214 05/20/20 0439  AST 39  --  39  --  36 43* 36  --   --   ALT 25   --  24  --  '21 20 18  ' --   --   ALKPHOS 81  --  79  --  71 99 86  --   --   BILITOT 0.7  --  0.7  --  0.8 0.6 0.4  --   --   PROT 4.4*  --  4.1*  --  4.9* 4.3* 4.1*  --   --   ALBUMIN 1.5*   < > 1.4*   < > 2.8* 1.8* 1.5* 1.6* 1.4*   < > = values in this interval not displayed.   Recent Labs  Lab 05/14/20 0040 05/15/20 0205 05/16/20 0331 05/17/20 0535 05/18/20 1830  LIPASE 1,510* 1,141* 912* 337* 171*   Recent Labs  Lab 05/15/20 2006  AMMONIA 17   Coagulation Profile: Recent Labs  Lab 05/14/20 0040  INR 1.1   Cardiac Enzymes: No results for input(s): CKTOTAL, CKMB, CKMBINDEX, TROPONINI in the last 168 hours. BNP (last 3 results) No results for input(s): PROBNP in the last 8760 hours. HbA1C: No results for input(s): HGBA1C in the last 72 hours. CBG: Recent Labs  Lab 05/19/20 1159 05/19/20 1805 05/19/20 2356 05/20/20 0545 05/20/20 1158  GLUCAP 165* 159* 157* 143* 129*   Lipid Profile: No results for input(s): CHOL, HDL, LDLCALC, TRIG, CHOLHDL, LDLDIRECT in the last 72 hours. Thyroid Function Tests: Recent Labs    05/18/20 0517  FREET4 0.55*   Anemia Panel: No results for input(s): VITAMINB12, FOLATE, FERRITIN, TIBC, IRON, RETICCTPCT in the last 72 hours. Sepsis Labs: Recent Labs  Lab 05/15/20 2223 05/16/20 0327 05/17/20 0535 05/18/20 0517  PROCALCITON  --  1.84 1.37 0.84  LATICACIDVEN 2.0* 1.1  --   --  Recent Results (from the past 240 hour(s))  Culture, blood (single) w Reflex to ID Panel     Status: None   Collection Time: 05/11/20  7:59 AM   Specimen: BLOOD  Result Value Ref Range Status   Specimen Description BLOOD LEFT WRIST  Final   Special Requests   Final    BOTTLES DRAWN AEROBIC AND ANAEROBIC Blood Culture adequate volume   Culture   Final    NO GROWTH 5 DAYS Performed at [facility], [address] [place], [place] [address]    Report Status 05/16/2020 FINAL  Final  CULTURE, BLOOD (ROUTINE X 2) w Reflex to ID Panel      Status: None (Preliminary result)   Collection Time: 05/15/20 11:52 PM   Specimen: Left Antecubital; Blood  Result Value Ref Range Status   Specimen Description LEFT ANTECUBITAL  Final   Special Requests   Final    BOTTLES DRAWN AEROBIC AND ANAEROBIC Blood Culture adequate volume   Culture   Final    NO GROWTH 4 DAYS Performed at [facility], [address] [place], [place] [address]    Report Status PENDING  Incomplete  CULTURE, BLOOD (ROUTINE X 2) w Reflex to ID Panel     Status: None (Preliminary result)   Collection Time: 05/15/20 11:53 PM   Specimen: BLOOD LEFT HAND  Result Value Ref Range Status   Specimen Description BLOOD LEFT HAND  Final   Special Requests   Final    BOTTLES DRAWN AEROBIC AND ANAEROBIC Blood Culture adequate volume   Culture   Final    NO GROWTH 4 DAYS Performed at [facility], [address], [place], [place] [address]    Report Status PENDING  Incomplete  MRSA PCR Screening     Status: None   Collection Time: 05/16/20 12:24 AM   Specimen: Nasopharyngeal  Result Value Ref Range Status   MRSA by PCR NEGATIVE NEGATIVE Final    Comment:        The GeneXpert MRSA Assay (FDA approved for NASAL specimens only), is one component of a comprehensive MRSA colonization surveillance program. It is not intended to diagnose MRSA infection nor to guide or monitor treatment for MRSA infections. Performed at [facility], [address], [place], [place] [address]   Urine Culture     Status: None   Collection Time: 05/16/20  3:27 AM   Specimen: Urine, Random  Result Value Ref Range Status   Specimen Description   Final    URINE, RANDOM Performed at [facility], [address] [place], [place] [address]    Special Requests   Final    NONE Performed at [facility], [address] [place], [place] [address]    Culture   Final    NO GROWTH Performed at [facility], [place] [address]  [place], [place] [address]    Report Status 05/17/2020 FINAL  Final  Gastrointestinal Panel by PCR , Stool     Status: None   Collection Time: 05/17/20  3:06 PM   Specimen: Stool  Result Value Ref Range Status   Campylobacter species NOT DETECTED NOT DETECTED Final   Plesimonas shigelloides NOT DETECTED NOT DETECTED Final   Salmonella species NOT DETECTED NOT DETECTED Final   Yersinia enterocolitica NOT DETECTED NOT DETECTED Final   Vibrio species NOT DETECTED NOT DETECTED Final   Vibrio cholerae NOT DETECTED NOT DETECTED Final   Enteroaggregative E coli (EAEC) NOT DETECTED  NOT DETECTED Final   Enteropathogenic E coli (EPEC) NOT DETECTED NOT DETECTED Final   Enterotoxigenic E coli (ETEC) NOT DETECTED NOT DETECTED Final   Shiga like toxin producing E coli (STEC) NOT DETECTED NOT DETECTED Final   Shigella/Enteroinvasive E coli (EIEC) NOT DETECTED NOT DETECTED Final   Cryptosporidium NOT DETECTED NOT DETECTED Final   Cyclospora cayetanensis NOT DETECTED NOT DETECTED Final   Entamoeba histolytica NOT DETECTED NOT DETECTED Final   Giardia lamblia NOT DETECTED NOT DETECTED Final   Adenovirus F40/41 NOT DETECTED NOT DETECTED Final   Astrovirus NOT DETECTED NOT DETECTED Final   Norovirus GI/GII NOT DETECTED NOT DETECTED Final   Rotavirus A NOT DETECTED NOT DETECTED Final   Sapovirus (I, II, IV, and V) NOT DETECTED NOT DETECTED Final    Comment: Performed at [facility], [address] [place], [place] [address]  C Difficile Quick Screen (NO PCR Reflex)     Status: None   Collection Time: 05/17/20  3:06 PM   Specimen: STOOL  Result Value Ref Range Status   C Diff antigen NEGATIVE NEGATIVE Final   C Diff toxin NEGATIVE NEGATIVE Final   C Diff interpretation No C. difficile detected.  Final    Comment: Performed at [facility], [address] [place], [place] [address]      Imaging Studies   CT VENOGRAM HEAD  Result Date: 05/20/2020 CLINICAL DATA:  Follow-up dural  venous thrombosis. Subacute infarctions right posterior parietal lobe and right cerebellum. EXAM: CT VENOGRAM HEAD TECHNIQUE: CT venography performed and compared with previous studies beginning 05/03/2020. CONTRAST:  109m OMNIPAQUE IOHEXOL 350 MG/ML SOLN COMPARISON:  Multiple previous studies beginning 05/03/2020. FINDINGS: Subtle low density in the right cerebellum at the site recent acute infarction. Subtle loss of gray-white differentiation in the right parietal region consistent with the area of acute cortical infarction. No swelling or hemorrhage. No mass, hydrocephalus or extra-axial collection. Superior sagittal sinus remains patent to the torcula where there is residual intraluminal thrombus as seen previously. Thrombosis of the left transverse sinus with a small amount of peripheral flow as seen previously. Sigmoid sinus and jugular vein on the left appear normal. Right transverse sinus, sigmoid sinus and jugular vein are patent. IMPRESSION: Similar appearance with intraluminal thrombus in the torcula and left transverse sinus. There is a small amount of peripheral flow around thrombus. Subtle low density in the right cerebellum and at the gray-white junction of the right parietal lobe consistent with the areas acute infarction shown by MRI. Electronically Signed   By: MNelson ChimesM.D.   On: 05/20/2020 13:22     Medications   Scheduled Meds: . Chlorhexidine Gluconate Cloth  6 each Topical Daily  . Gerhardt's butt cream   Topical TID  . insulin aspart  0-9 Units Subcutaneous Q6H  . pantoprazole (PROTONIX) IV  40 mg Intravenous Q12H  . sodium chloride flush  3 mL Intravenous Q12H   Continuous Infusions: . sodium chloride Stopped (05/13/20 1628)  . heparin 1,750 Units/hr (05/20/20 0144)  . meropenem (MERREM) IV 1 g (05/20/20 0809)  . norepinephrine (LEVOPHED) Adult infusion Stopped (05/18/20 1526)  . TPN ADULT (ION) 85 mL/hr at 05/19/20 1748  . TPN ADULT (ION)         LOS: 17 days     Time spent: 20 minutes with > 50% spent in coordination of care and direct patient contact.   Eyad Rochford SManuella Ghazi DO [facility]  05/20/2020, 2:48 PM    If 7PM-7AM, please contact  night-coverage. How to contact the [facility] Attending or Consulting provider [place] or covering provider during after hours [place], for this patient?    1. Check the care team in [facility] and look for a) attending/consulting [facility] provider listed and b) the [facility] team listed 2. Log into [email or web address] and use [place]'s universal password to access. If you do not have the password, please contact the hospital operator. 3. Locate the [facility] provider you are looking for under [facility] and page to a number that you can be directly reached. 4. If you still have difficulty reaching the provider, please page the [facility] (Director on Call) for the Hospitalists listed on amion for assistance.

## 2020-05-20 NOTE — Plan of Care (Signed)
Pt has multiple wounds without healing.

## 2020-05-20 NOTE — Progress Notes (Signed)
Subjective: Patient remains lethargic.    Objective: Current vital signs: BP 117/65   Pulse (!) 115   Temp 99.5 F (37.5 C) (Oral)   Resp (!) 23   Ht 5\' 7"  (1.702 m)   Wt 68.3 kg   SpO2 94%   BMI 23.58 kg/m  Vital signs in last 24 hours: Temp:  [97.6 F (36.4 C)-100 F (37.8 C)] 99.5 F (37.5 C) (07/11 0800) Pulse Rate:  [96-115] 115 (07/11 0500) Resp:  [18-24] 23 (07/11 0500) BP: (105-134)/(62-91) 117/65 (07/11 0500) SpO2:  [87 %-100 %] 94 % (07/11 0500)  Intake/Output from previous day: 07/10 0701 - 07/11 0700 In: 1730.5 [I.V.:1530.5; IV Piggyback:200] Out: 1000 [Urine:1000] Intake/Output this shift: No intake/output data recorded. Nutritional status:  Diet Order            Diet regular Room service appropriate? Yes; Fluid consistency: Thin  Diet effective now                 Neurologic Exam: Mental Status: Lethargic but arousable.  Dysarthric.  Able to follow simple commands with delay.   Cranial Nerves: II: Pupils unequal with left pupil being larger than right pupil.  Both reactive to light.   III,IV, VI: extra-ocular motions intact bilaterally V,VII: face symmetric Motor: Moves extremities weakly against gravity Sensory: Responds to noxious stimuli in all extremities   Lab Results: Basic Metabolic Panel: Recent Labs  Lab 05/16/20 0331 05/16/20 0331 05/17/20 0535 05/17/20 0535 05/18/20 0517 05/19/20 0214 05/20/20 0439  NA 137  --  139  --  136 133*  132* 137  K 3.9  --  3.6  --  3.6 4.6  4.5 4.9  CL 102  --  106  --  105 101  101 103  CO2 22  --  19*  --  26 25  25 28   GLUCOSE 126*  --  139*  --  135* 145*  145* 149*  BUN 29*  --  24*  --  15 13  13 14   CREATININE 1.42*  --  1.18  --  0.81 0.69  0.72 0.58*  CALCIUM 5.3*   < > 5.5*   < > 6.8* 7.7*  7.6* 8.1*  MG 1.9  --  1.7  --  2.3 1.9 1.7  PHOS 4.7*  --  4.9*  --  4.0 3.4 3.9   < > = values in this interval not displayed.    Liver Function Tests: Recent Labs  Lab  05/15/20 0205 05/15/20 0205 05/15/20 2006 05/15/20 2006 05/16/20 0331 05/17/20 0535 05/18/20 0517 05/19/20 0214 05/20/20 0439  AST 39  --  39  --  36 43* 36  --   --   ALT 25  --  24  --  21 20 18   --   --   ALKPHOS 81  --  79  --  71 99 86  --   --   BILITOT 0.7  --  0.7  --  0.8 0.6 0.4  --   --   PROT 4.4*  --  4.1*  --  4.9* 4.3* 4.1*  --   --   ALBUMIN 1.5*   < > 1.4*   < > 2.8* 1.8* 1.5* 1.6* 1.4*   < > = values in this interval not displayed.   Recent Labs  Lab 05/14/20 0040 05/15/20 0205 05/16/20 0331 05/17/20 0535 05/18/20 1830  LIPASE 1,510* 1,141* 912* 337* 171*   Recent Labs  Lab 05/15/20 2006  AMMONIA 17    CBC: Recent Labs  Lab 05/15/20 2006 05/16/20 0331 05/18/20 0032 05/18/20 0517 05/18/20 1830 05/19/20 0214 05/20/20 0439  WBC 34.2*   < > 28.8* 27.0* 19.2* 16.9* 15.9*  NEUTROABS 31.7*  --   --   --   --   --   --   HGB 9.1*   < > 7.3* 7.0* 9.8* 9.7* 8.1*  HCT 26.4*   < > 21.7* 20.0* 28.6* 28.0* 24.3*  MCV 95.0   < > 96.4 93.0 90.2 90.3 91.7  PLT 126*   < > 125* 121* 113* 94* 77*   < > = values in this interval not displayed.    Cardiac Enzymes: No results for input(s): CKTOTAL, CKMB, CKMBINDEX, TROPONINI in the last 168 hours.  Lipid Panel: Recent Labs  Lab 05/16/20 1230  TRIG 45    CBG: Recent Labs  Lab 05/19/20 0605 05/19/20 1159 05/19/20 1805 05/19/20 2356 05/20/20 0545  GLUCAP 179* 165* 159* 157* 143*    Microbiology: Results for orders placed or performed during the hospital encounter of 05/03/20  SARS Coronavirus 2 by RT PCR (hospital order, performed in [facility] hospital lab) Nasopharyngeal Nasopharyngeal Swab     Status: None   Collection Time: 05/03/20  8:25 PM   Specimen: Nasopharyngeal Swab  Result Value Ref Range Status   SARS Coronavirus 2 NEGATIVE NEGATIVE Final    Comment: (NOTE) SARS-CoV-2 target nucleic acids are NOT DETECTED.  The SARS-CoV-2 RNA is generally detectable in upper and  lower respiratory specimens during the acute phase of infection. The lowest concentration of SARS-CoV-2 viral copies this assay can detect is 250 copies / mL. A negative result does not preclude SARS-CoV-2 infection and should not be used as the sole basis for treatment or other patient management decisions.  A negative result may occur with improper specimen collection / handling, submission of specimen other than nasopharyngeal swab, presence of viral mutation(s) within the areas targeted by this assay, and inadequate number of viral copies (<250 copies / mL). A negative result must be combined with clinical observations, patient history, and epidemiological information.  Fact Sheet for Patients:   BoilerBrush.com.cy  Fact Sheet for Healthcare Providers: https://pope.com/  This test is not yet approved or  cleared by the [place] FDA and has been authorized for detection and/or diagnosis of SARS-CoV-2 by FDA under an Emergency Use Authorization (EUA).  This EUA will remain in effect (meaning this test can be used) for the duration of the COVID-19 declaration under Section 564(b)(1) of the Act, 21 U.S.C. section 360bbb-3(b)(1), unless the authorization is terminated or revoked sooner.  Performed at [facility], [address] [place], [place] [address]   MRSA PCR Screening     Status: None   Collection Time: 05/03/20 11:57 PM   Specimen: Nasopharyngeal  Result Value Ref Range Status   MRSA by PCR NEGATIVE NEGATIVE Final    Comment:        The GeneXpert MRSA Assay (FDA approved for NASAL specimens only), is one component of a comprehensive MRSA colonization surveillance program. It is not intended to diagnose MRSA infection nor to guide or monitor treatment for MRSA infections. Performed at [facility], [address] [place], [place] [address]   Body fluid culture     Status: None   Collection  Time: 05/07/20 12:00 PM   Specimen: PATH Cytology Peritoneal fluid  Result Value Ref Range Status   Specimen Description   Final  PERITONEAL Performed at [facility], [address] [place], [place] [address]    Special Requests   Final    PERITONEAL Performed at [facility], [address] [place], [place] [address]    Gram Stain   Final    WBC PRESENT,BOTH PMN AND MONONUCLEAR NO ORGANISMS SEEN CYTOSPIN SMEAR    Culture   Final    NO GROWTH 3 DAYS Performed at [facility], [address] [place], [place] [address]    Report Status 05/11/2020 FINAL  Final  Culture, blood (single) w Reflex to ID Panel     Status: None   Collection Time: 05/11/20  7:59 AM   Specimen: BLOOD  Result Value Ref Range Status   Specimen Description BLOOD LEFT WRIST  Final   Special Requests   Final    BOTTLES DRAWN AEROBIC AND ANAEROBIC Blood Culture adequate volume   Culture   Final    NO GROWTH 5 DAYS Performed at [facility], [address] [place], [place] [address]    Report Status 05/16/2020 FINAL  Final  CULTURE, BLOOD (ROUTINE X 2) w Reflex to ID Panel     Status: None (Preliminary result)   Collection Time: 05/15/20 11:52 PM   Specimen: Left Antecubital; Blood  Result Value Ref Range Status   Specimen Description LEFT ANTECUBITAL  Final   Special Requests   Final    BOTTLES DRAWN AEROBIC AND ANAEROBIC Blood Culture adequate volume   Culture   Final    NO GROWTH 4 DAYS Performed at [facility], [address] [place], [place] [address]    Report Status PENDING  Incomplete  CULTURE, BLOOD (ROUTINE X 2) w Reflex to ID Panel     Status: None (Preliminary result)   Collection Time: 05/15/20 11:53 PM   Specimen: BLOOD LEFT HAND  Result Value Ref Range Status   Specimen Description BLOOD LEFT HAND  Final   Special Requests   Final    BOTTLES DRAWN AEROBIC AND ANAEROBIC Blood Culture adequate volume   Culture   Final    NO GROWTH  4 DAYS Performed at [facility], [address] [place], [place] [address]    Report Status PENDING  Incomplete  MRSA PCR Screening     Status: None   Collection Time: 05/16/20 12:24 AM   Specimen: Nasopharyngeal  Result Value Ref Range Status   MRSA by PCR NEGATIVE NEGATIVE Final    Comment:        The GeneXpert MRSA Assay (FDA approved for NASAL specimens only), is one component of a comprehensive MRSA colonization surveillance program. It is not intended to diagnose MRSA infection nor to guide or monitor treatment for MRSA infections. Performed at [facility], [address] [place], [place] [address]   Urine Culture     Status: None   Collection Time: 05/16/20  3:27 AM   Specimen: Urine, Random  Result Value Ref Range Status   Specimen Description   Final    URINE, RANDOM Performed at [facility], [address] [place], [place] [address]    Special Requests   Final    NONE Performed at [facility], [address] [place], [place] [address]    Culture   Final    NO GROWTH Performed at [facility], [address] [place], [place] [address]    Report Status 05/17/2020 FINAL  Final  Gastrointestinal Panel by PCR , Stool  Status: None   Collection Time: 05/17/20  3:06 PM   Specimen: Stool  Result Value Ref Range Status   Campylobacter species NOT DETECTED NOT DETECTED Final   Plesimonas shigelloides NOT DETECTED NOT DETECTED Final   Salmonella species NOT DETECTED NOT DETECTED Final   Yersinia enterocolitica NOT DETECTED NOT DETECTED Final   Vibrio species NOT DETECTED NOT DETECTED Final   Vibrio cholerae NOT DETECTED NOT DETECTED Final   Enteroaggregative E coli (EAEC) NOT DETECTED NOT DETECTED Final   Enteropathogenic E coli (EPEC) NOT DETECTED NOT DETECTED Final   Enterotoxigenic E coli (ETEC) NOT DETECTED NOT DETECTED Final   Shiga like toxin producing E coli (STEC) NOT DETECTED NOT DETECTED Final    Shigella/Enteroinvasive E coli (EIEC) NOT DETECTED NOT DETECTED Final   Cryptosporidium NOT DETECTED NOT DETECTED Final   Cyclospora cayetanensis NOT DETECTED NOT DETECTED Final   Entamoeba histolytica NOT DETECTED NOT DETECTED Final   Giardia lamblia NOT DETECTED NOT DETECTED Final   Adenovirus F40/41 NOT DETECTED NOT DETECTED Final   Astrovirus NOT DETECTED NOT DETECTED Final   Norovirus GI/GII NOT DETECTED NOT DETECTED Final   Rotavirus A NOT DETECTED NOT DETECTED Final   Sapovirus (I, II, IV, and V) NOT DETECTED NOT DETECTED Final    Comment: Performed at [facility], [address] [place], [place] [address]  C Difficile Quick Screen (NO PCR Reflex)     Status: None   Collection Time: 05/17/20  3:06 PM   Specimen: STOOL  Result Value Ref Range Status   C Diff antigen NEGATIVE NEGATIVE Final   C Diff toxin NEGATIVE NEGATIVE Final   C Diff interpretation No C. difficile detected.  Final    Comment: Performed at [facility], [address] [place], [place] [address]    Coagulation Studies: No results for input(s): LABPROT, INR in the last 72 hours.  Imaging: No results found.  Medications:  I have reviewed the patient's current medications. Scheduled: . Chlorhexidine Gluconate Cloth  6 each Topical Daily  . Gerhardt's butt cream   Topical TID  . insulin aspart  0-9 Units Subcutaneous Q6H  . pantoprazole (PROTONIX) IV  40 mg Intravenous Q12H  . sodium chloride flush  3 mL Intravenous Q12H   Continuous: . sodium chloride Stopped (05/13/20 1628)  . heparin 1,750 Units/hr (05/20/20 0144)  . meropenem (MERREM) IV 1 g (05/20/20 0809)  . norepinephrine (LEVOPHED) Adult infusion Stopped (05/18/20 1526)  . TPN ADULT (ION) 85 mL/hr at 05/19/20 1748  . TPN ADULT (ION)      Assessment/Plan: 34 y.o.malewith a long history of ETOH abuse. Abruptly stopped drinking the day before presentation (6/24) and became progressively confused. Was brought to the ED  by his wife and while in the ED was witnessed to have a seizure. Patient has not had further seizures but remained confused and lethargic.MRI of the brain personally reviewed and reveals a possible cerebral venous thrombosis as well as abnormal diffusion in the right cerebellum, bilateral parietal lobes and left occipital pole. 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 Thyroid normal. EEGonly significant for slowing. B1 normal.Mental statusworsened on 7/6. Repeat MRI of the brain repeatedand showed nonew infarcts. No evidence of hydrocephalus or hemorrhage.Paracentesis performed on7/7. Patient with improved mental status initially after paracentesis but has since been more lethargic.  Today with unequal pupils as well.  No evidence of seizure activity.  Recommendations: 1.Repeat CT venogram to follow up CVT and evaluate for development of hydrocephalus/hemorrhage.  LOS: 17 days   Thana Farr, MD Neurology [phone] 05/20/2020  10:23 AM

## 2020-05-21 DIAGNOSIS — I959 Hypotension, unspecified: Secondary | ICD-10-CM

## 2020-05-21 DIAGNOSIS — D696 Thrombocytopenia, unspecified: Secondary | ICD-10-CM

## 2020-05-21 DIAGNOSIS — R195 Other fecal abnormalities: Secondary | ICD-10-CM

## 2020-05-21 DIAGNOSIS — R000 Tachycardia, unspecified: Secondary | ICD-10-CM

## 2020-05-21 LAB — PREALBUMIN: Prealbumin: <5 mg/dL — ABNORMAL LOW (ref 18–38)

## 2020-05-21 LAB — CULTURE, BLOOD (ROUTINE X 2)
Culture: NO GROWTH
Culture: NO GROWTH
Special Requests: ADEQUATE
Special Requests: ADEQUATE

## 2020-05-21 LAB — DIFFERENTIAL
Abs Immature Granulocytes: 0.86 10*3/uL — ABNORMAL HIGH (ref 0.00–0.07)
Basophils Absolute: 0.1 10*3/uL (ref 0.0–0.1)
Basophils Relative: 1 %
Eosinophils Absolute: 0.4 10*3/uL (ref 0.0–0.5)
Eosinophils Relative: 2 %
Immature Granulocytes: 6 %
Lymphocytes Relative: 8 %
Lymphs Abs: 1.3 10*3/uL (ref 0.7–4.0)
Monocytes Absolute: 1.2 10*3/uL — ABNORMAL HIGH (ref 0.1–1.0)
Monocytes Relative: 8 %
Neutro Abs: 11.9 10*3/uL — ABNORMAL HIGH (ref 1.7–7.7)
Neutrophils Relative %: 75 %
Smear Review: NORMAL

## 2020-05-21 LAB — COMPREHENSIVE METABOLIC PANEL
ALT: 61 U/L — ABNORMAL HIGH (ref 0–44)
AST: 115 U/L — ABNORMAL HIGH (ref 15–41)
Albumin: 1.4 g/dL — ABNORMAL LOW (ref 3.5–5.0)
Alkaline Phosphatase: 188 U/L — ABNORMAL HIGH (ref 38–126)
Anion gap: 6 (ref 5–15)
BUN: 18 mg/dL (ref 6–20)
CO2: 28 mmol/L (ref 22–32)
Calcium: 8.3 mg/dL — ABNORMAL LOW (ref 8.9–10.3)
Chloride: 103 mmol/L (ref 98–111)
Creatinine, Ser: 0.57 mg/dL — ABNORMAL LOW (ref 0.61–1.24)
GFR calc Af Amer: >60 mL/min (ref >60)
GFR calc non Af Amer: >60 mL/min (ref >60)
Glucose, Bld: 133 mg/dL — ABNORMAL HIGH (ref 70–99)
Potassium: 5 mmol/L (ref 3.5–5.1)
Sodium: 137 mmol/L (ref 135–145)
Total Bilirubin: 0.5 mg/dL (ref 0.3–1.2)
Total Protein: 4.6 g/dL — ABNORMAL LOW (ref 6.5–8.1)

## 2020-05-21 LAB — CBC
HCT: 23 % — ABNORMAL LOW (ref 39.0–52.0)
Hemoglobin: 7.6 g/dL — ABNORMAL LOW (ref 13.0–17.0)
MCH: 30.5 pg (ref 26.0–34.0)
MCHC: 33 g/dL (ref 30.0–36.0)
MCV: 92.4 fL (ref 80.0–100.0)
Platelets: 79 10*3/uL — ABNORMAL LOW (ref 150–400)
RBC: 2.49 MIL/uL — ABNORMAL LOW (ref 4.22–5.81)
RDW: 15.3 % (ref 11.5–15.5)
WBC: 15.7 10*3/uL — ABNORMAL HIGH (ref 4.0–10.5)
nRBC: 0.1 % (ref 0.0–0.2)

## 2020-05-21 LAB — IRON AND TIBC
Iron: 17 ug/dL — ABNORMAL LOW (ref 45–182)
Saturation Ratios: 15 % — ABNORMAL LOW (ref 17.9–39.5)
TIBC: 116 ug/dL — ABNORMAL LOW (ref 250–450)
UIBC: 99 ug/dL

## 2020-05-21 LAB — TRIGLYCERIDES: Triglycerides: 66 mg/dL (ref <150)

## 2020-05-21 LAB — LIPASE, BLOOD: Lipase: 241 U/L — ABNORMAL HIGH (ref 11–51)

## 2020-05-21 LAB — FERRITIN: Ferritin: 971 ng/mL — ABNORMAL HIGH (ref 24–336)

## 2020-05-21 LAB — HEPARIN LEVEL (UNFRACTIONATED)
Heparin Unfractionated: 0.28 IU/mL — ABNORMAL LOW (ref 0.30–0.70)
Heparin Unfractionated: 0.29 IU/mL — ABNORMAL LOW (ref 0.30–0.70)
Heparin Unfractionated: 0.33 IU/mL (ref 0.30–0.70)

## 2020-05-21 LAB — FOLATE: Folate: 5.6 ng/mL — ABNORMAL LOW (ref >5.9)

## 2020-05-21 LAB — CK: Total CK: 30 U/L — ABNORMAL LOW (ref 49–397)

## 2020-05-21 LAB — PROCALCITONIN: Procalcitonin: 1.03 ng/mL

## 2020-05-21 LAB — RETICULOCYTES
Immature Retic Fract: 47.1 % — ABNORMAL HIGH (ref 2.3–15.9)
RBC.: 2.58 MIL/uL — ABNORMAL LOW (ref 4.22–5.81)
Retic Count, Absolute: 60.4 10*3/uL (ref 19.0–186.0)
Retic Ct Pct: 2.3 % (ref 0.4–3.1)

## 2020-05-21 LAB — GLUCOSE, CAPILLARY
Glucose-Capillary: 120 mg/dL — ABNORMAL HIGH (ref 70–99)
Glucose-Capillary: 110 mg/dL — ABNORMAL HIGH (ref 70–99)

## 2020-05-21 LAB — MAGNESIUM: Magnesium: 1.7 mg/dL (ref 1.7–2.4)

## 2020-05-21 LAB — SEDIMENTATION RATE: Sed Rate: 127 mm/hr — ABNORMAL HIGH (ref 0–15)

## 2020-05-21 LAB — PHOSPHORUS: Phosphorus: 4.7 mg/dL — ABNORMAL HIGH (ref 2.5–4.6)

## 2020-05-21 LAB — VITAMIN B12: Vitamin B-12: 830 pg/mL (ref 180–914)

## 2020-05-21 LAB — C-REACTIVE PROTEIN: CRP: 12.9 mg/dL — ABNORMAL HIGH (ref <1.0)

## 2020-05-21 MED ORDER — SERTRALINE HCL 20 MG/ML PO CONC
25.0000 mg | Freq: Every day | ORAL | Status: DC
  Filled 2020-05-21 (×2): qty 1.25

## 2020-05-21 MED ORDER — METOPROLOL TARTRATE 25 MG PO TABS
12.5000 mg | ORAL_TABLET | Freq: Two times a day (BID) | ORAL | Status: DC
  Administered 2020-05-21 – 2020-05-23 (×6): 12.5 mg via ORAL
  Filled 2020-05-21 (×6): qty 1

## 2020-05-21 MED ORDER — VANCOMYCIN HCL 1250 MG/250ML IV SOLN
1250.0000 mg | Freq: Two times a day (BID) | INTRAVENOUS | Status: DC
  Filled 2020-05-21: qty 250

## 2020-05-21 MED ORDER — MIRTAZAPINE 15 MG PO TBDP
15.0000 mg | ORAL_TABLET | Freq: Every day | ORAL | Status: DC
  Filled 2020-05-21: qty 1

## 2020-05-21 MED ORDER — FOLIC ACID 5 MG/ML IJ SOLN
1.0000 mg | Freq: Every day | INTRAMUSCULAR | Status: DC
  Administered 2020-05-22 – 2020-05-27 (×5): 1 mg via INTRAVENOUS
  Filled 2020-05-21 (×6): qty 0.2

## 2020-05-21 MED ORDER — ACAMPROSATE CALCIUM 333 MG PO TBEC
666.0000 mg | DELAYED_RELEASE_TABLET | Freq: Three times a day (TID) | ORAL | Status: DC
  Filled 2020-05-21: qty 2

## 2020-05-21 MED ORDER — VANCOMYCIN HCL 1500 MG/300ML IV SOLN
1500.0000 mg | Freq: Once | INTRAVENOUS | Status: DC
  Administered 2020-05-21: 1500 mg via INTRAVENOUS
  Filled 2020-05-21: qty 300

## 2020-05-21 MED ORDER — TRAVASOL 10 % IV SOLN
INTRAVENOUS | Status: AC
  Filled 2020-05-21: qty 1040.4

## 2020-05-21 NOTE — Progress Notes (Signed)
CH visited pt. as follow-up from visits last week and to clarify pt.'s wishes re: potential marriage ceremony today.  Pt. lying down in bed, very drowsy but able to talk.  Says he is experiencing some vision abnormalities: double vision and blurriness.  Pt. says his legs hurt a lot yesterday.  S.O. BettyAnn in rm. as well; said she has not yet been able to go to county courthouse to obtain documents for marriage ceremony but still hopes to do this.  RNs and chaplains are prepared to facilitate in-room wedding if proper paperwork can be obtained.  S.O. said she has been worried about pt. and is having trouble sleeping; she is staying w/her parents to avoid being alone in house she shares w/pt.  S.O. says pt. would still like all possible measures done to prolong his life; says he has been in pain but refusing pain medication because when pt.'s father was at EOL they 'snowed' him with sedatives and pt. does not want that.  CH reminded pt. and family of availability and will continue to monitor pt./family needs.    05/21/20 1130  Clinical Encounter Type  Visited With Patient and family together  Visit Type Follow-up;Psychological support;Spiritual support;Social support;Critical Care  Referral From Family  Consult/Referral To Chaplain  Spiritual Encounters  Spiritual Needs Emotional  Stress Factors  Family Stress Factors Major life changes;Loss of control;Health changes

## 2020-05-21 NOTE — Consult Note (Signed)
Reason for Consult: Rule out connective tissue disease  Referring Physician: Hospitalist  Shane Sergeant Sr.   HPI: Complicated story.  History of longstanding regular alcohol use.  Admitted acutely 18 days ago because of disorientation and mental status changes.  Some difficulty walking.Prior to that he had morning vomiting.  On a regular basis. About 6 months prior he had red bumps on the tops of his feet.  Not sure that they were involving the shin.  Not particularly painful.  Did not have any on his hands or his trunk. 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  May have had a seizure.  Small areas of infarct Pancreatitis.  Elevated lipase.  Abdominal injury imaging consistent with a pseudocyst numerous lesions Developed more erythematous lesions of the legs and some over the hands.  Ulcerated over the heels bilaterally and pressure points CPK was elevated on admission.  Has had weakness in his legs and thighs CPK of over 1000 Is getting broad-spectrum antibiotics.  Has leukocytosis and concerned about infected pseudocyst.  Has been seen by neurology No oral ulcers.  No genital ulcers. Admitting white count was 11,000 with hemoglobin 15.  Hemoglobin now down to 7.6.  White count was as high as 40,000.  Creatinine normal.  Thrombocytopenia 79,000 platelets.  Sedimentation rate 127.  Ferritin 971 PMH: No history of connective tissue disease  SURGICAL HISTORY: No joint surgeries  Family History: No family history of connective tissue disease  Social History: No illicit drug use  Allergies:  Allergies  Allergen Reactions  . Sudafed [Pseudoephedrine]     History per pt of this being linked with his seizures    Medications:  Scheduled: . Chlorhexidine Gluconate Cloth  6 each Topical Daily  . Gerhardt's butt cream   Topical TID  . insulin aspart  0-9 Units Subcutaneous Q6H  . metoprolol tartrate  12.5 mg Oral BID  . pantoprazole (PROTONIX) IV  40 mg Intravenous  Q12H  . sodium chloride flush  3 mL Intravenous Q12H        ROS: No recent chest pain.  Minimal abdominal pain.  Minimal headache.  No joint symptoms.  No Raynaud's.   PHYSICAL EXAM: Blood pressure 123/76, pulse (!) 129, temperature 99.2 F (37.3 C), temperature source Oral, resp. rate (!) 28, height 5\' 7"  (1.702 m), weight 68.3 kg, SpO2 95 %. Pale appearing male.  Alert.  Answers my questions along with his wife/fiance.  Generalized numerous confluent and some noncompliant erythematous lesions lower extremities.  Some over his fingers.  Some have a central purpleish hue.  Mildly painful.  Minimally raised.  Sclera are pale.  Oropharynx has no oral ulcers.  Neck is supple.  Chest sounds are distant.  No murmur or gallop.  Abdomen is mildly tender.  No visceromegaly.  Extremities have 1+ edema Musculoskeletal: Both shoulders move well.  Hands without synovitis.  Knees without effusions.  Both hips move well but his thigh muscles are painful with moving his hips.  Ankles move reasonably Neurologic: He can hold his leg off the bed bilaterally but only for short period and not against any force.  Weakness plantar and dorsiflexion.  Reasonable grip bilaterally There are eschar lesions in his arms bilaterally where he has had sticks No dermatographism on the abdomen  Assessment: Complicated presentation of multisystems including: -Cerebral thromboses and infarct manifestation with mental status changes and possible seizure -Pancreatitis with multiple pancreatic cysts.  An elevated lipase -Lower extremity and upper extremity lesions which could  be vasculitis or panniculitis(no history of oral or genital ulcers to suggest Behcet's.  No dermatographism or inflammatory eye disease.  Question pathergy) muscle pain and muscle weakness -History of significant alcohol use but without cirrhosis on imaging -Leukocytosis without positive culture  Recommendations: Anticardiolipin antibody, lupus  anticoagulant, repeat CPK  Skin biopsy.  If it shows panniculitis or vasculitis, then consideration for steroids.  Would be hesitant to start steroids without that biopsy confirmation given his pancreatic disease  Shane Jenkins 05/21/2020, 7:05 PM

## 2020-05-21 NOTE — Progress Notes (Signed)
PROGRESS NOTE    Shane Mccorkel Sr.  ZOX:[id number] DOB: 1986-01-18 DOA: 05/03/2020 PCP: [name], MD   Brief Narrative:  34 year old gentleman with no medical history, chronic alcoholism, has been drinking nonstop about 12 packs of beer daily since age of 63 presented to the ED on  05/03/2020 for evaluation of progressive confusion and lethargy. He also had episode of unresponsiveness in the triage area. As per patient's fianc, patient stopped drinking about 24 hours prior to arrival and started behaving altered, confused. Also reported multiple episodes diarrhea, but no nausea or vomiting.  In ED he had 1 witnessed seizure. CT abdomen with concern of pancreatitis with elevated lipase, bilateral pleural effusions and ascites.  He was initially admitted for management of acute alcohol withdrawal and alcoholic pancreatitis.  Patient has a very complicated course of illness, underwent alcohol withdrawal.  There was some concern of SBP.  He was evaluated by surgeons on 05/06/2020 for concern of acute compartment syndrome and abdomen and found to have some intra-abdominal hypertension which was managed conservatively by placing Foley catheter and NG tube.  Sentences with cell count of 822, neutrophils of 79%.  Lipase was elevated at 53,000, LDH 526 and amylase of 10,000.  Cultures were negative.  Consistent with pancreatic ascites.   Lipase continued to get worse initially with peaked at 1543, now trending down.  MRCP and repeat CT abdomens with some attenuations in pancreatic head and body, some improvement in edema.      Neurology was consulted on 05/10/2020 due to worsening altered mental status. 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  Patient did not receive any coronavirus [employer] vaccine.  He was initiated on heparin.  EEG showed generalized background slowing.  No epileptiform activity was noted. A  repeat MRI brain without contrast done on 05/15/2020 showed Residual abnormal diffusion restriction over the right posterior parietal lobe and right cerebellum, consistent with expected evolution of subacute venous infarcts.  It was started on Heparin by neurology.  On 05/16/2019 1 in the evening he became hypotensive and hypothermic with worsening leukocytosis and development of lactic acidosis.  He was transferred to ICU on 05/16/2020 due to the concern for developing septic shock.  He was started on pressors.  His lactate was 2 1 pro-Cal was 1.84 WBC was 40.4 and hemoglobin was 11.9.  CT abdomen done on 05/16/2020 showed peripancreatic edema with multiple pseudocyst.  The dominant pseudocyst has decreased to 2.2 cm.  There was extensive ascites with loculation around the liver right gutter and pelvis. Patient has been started on meropenem.  Although urine, blood and ascitic fluid cultures remain negative.  C. difficile colitis and GI pathogens were negative.  Plan is to continue meropenem for 7 days for concern of infected pseudocyst.  Patient did developed decreased in hemoglobin and there was some concern of GI bleed.  Patient and his wife did endorse some blood in his stool before coming to the hospital.  Patient has a strong family history of colon cancer where both of his parents died of colon cancer, mother in 27s and father in 75s.  GI is recommending outpatient work-up after this acute illness.   TPN was started on 05/17/2020 due to poor p.o. intake.  Subjective: Patient has no new complaints today.  He appears depressed and becoming tearful in between.  Wife was in the room.  Patient and wife like to pursue aggressive measures for management  of his illness.  Assessment & Plan:   Principal Problem:   Acute alcoholic pancreatitis Active Problems:   Alcohol withdrawal seizure with delirium (HCC)   Hyponatremia   Hypokalemia   AKI (acute kidney injury) (HCC)   Elevated CK   Heme positive stool    Thrombocytopenia (HCC)   Acute metabolic encephalopathy   Severe protein-calorie malnutrition (HCC)   Sinus tachycardia   Ascites due to alcoholic hepatitis  Sepsis with septic shock.  Transferred to ICU on 05/15/2020 due to hypothermia and hypotension requiring Levophed.  Now off the Levophed and maintaining his blood pressure.  ID was also consulted and they started him on meropenem for concern of infected pseudocyst. Remained afebrile over the last 24-hour, before that having some low-grade fever.  Leukocytosis improving. -Continue meropenem for total of 7 days.  Day 4 today.  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   CT venogram on 7/3 and a repeat on 7/5 with persistent thrombosis at the confluence of sinuses and left transverse dural venous sinus.  Neurology is following and they start him on heparin infusion.  Patient also had acute right cerebellum and a possible old infarcts involving parietal lobes and left occipital region, seen on MRI done on 05/12/2020.  No hypercoagulable work-up. Due to his worsening lower extremity rash which is also involving some upper extremities there is some concern of vasculitis.  Patient also has some rash/thrush in his mouth. His differential is broad at this time which includes rheumatologic disorder/vasculitis/Behcet's syndrome/malignancy. Discussed with Dr. Gavin Potters from rheumatology and ordered rheumatologic and hypercoagulable work-up.  He will see the patient later today. Discussed with neurology and they do not have any possible etiology for his CVT. Discussed with ID-there are also concern about vasculitis and agreed with rheumatologic work-up.  They also ordered ANA, hepatitis panel and RPR. Discussed with GI to rule out any malignancy.  He has a strong family history of colon cancer as both of his parents died of colon cancer at an early age. They are advising outpatient work-up  after recovering from this acute illness. -Follow-up hypercoagulable and rheumatologic work-up.  Erythematous skin rash.  There is some concern of vasculitis.  Less likely hood of cellulitis as it is present on all extremities. -We will ask dermatology for biopsy.  Thrombocytopenia.  Platelets at 79.  Stable as compared to yesterday.  Patient was started on Heparin and platelets trending down 2 days later.  HIT score of 3.  Discussed with pharmacy as patient needs anticoagulation for CVT.  According to them it seems less likely as his hemoglobin is also dropping. -We will check HIT antibodies. -Continue with heparin and closely monitor platelets.  Anemia.  Hemoglobin trending down, received 2 unit of PRBC on 05/18/2020.  Per wife he did had some bleeding per rectum at home.  Not aware of any obvious bleeding while in the hospital.  Patient is critically ill also.  Had some mild hematuria on UA done on 05/16/2020.  No gross hematuria. Hemoglobin 7.6 today. -Check anemia panel. -Transfuse below 7. -Check FOBT. -Requested GI to look at his case while he is in hospital.  Chronic pancreatitis with pseudocyst formation.  Present on admission.  CT abdomen without any gallbladder abnormalities but did had multiple cyst in pancreatic head and body.  Lipase peaked at 1543, not trending down. -Continue supportive care.  Severe protein calorie malnutrition.  POA, most likely secondary to chronic alcohol abuse.  Patient does not want enteric feeding  with NG tube.  Currently on TPN since 05/17/2020. BMI of 23.58 . -Continue monitoring daily CMP, magnesium, phosphorous. -Encourage p.o. intake-patient was asking for some watermelon today, advised wife and nursing staff that he can get anything he wants from home. -We will try weaning him off from TPN once started taking p.o.  Transaminitis.  Patient developed mildly elevated AST, ALT and alkaline phosphatase today.  He is on TPN and also has an history of  alcohol abuse. -Continue to monitor.  TSH elevation.  T4 normal.  No acute concern at this time.  Diarrhea.  On and off since admission.  C. difficile colitis and GI pathogen panel negative.  Most likely secondary to pancreatitis.  Patient is also on TPN.  Sinus tachycardia.  No obvious cause.  Tachycardia persist off the pressors.  Denies any pain.  Past alcohol withdrawal.  Cultures negative. Echo normal. -Start him on low-dose metoprolol 12.5 mg twice daily and monitor.  Ascites.  Paracentesis labs are consistent with pancreatic ascites.  CT abdomen with mild fatty infiltrate, no cirrhosis.  Patient underwent paracentesis twice 1 on 05/07/2020 and second on 05/16/2020.  Culture remain negative.  Pressure injuries.  High risk due to severe protein calorie malnutrition and inability to move.  Getting some unstageable breakdown on his sacrum and bilateral heels. -Continue to monitor. -Frequent position changes.  AKI.  Patient had AKI on admission.  Most likely prerenal as creatinine normalized with IV hydration. -Continue to monitor.  Electrolyte abnormalities.  Patient had hypervolemic hyponatremia which was present on admission and has been resolved.  He also developed some hypokalemia which has been resolved at this time.  He is high risk for electrolyte abnormalities as he is on TPN.  Also concern of refeeding syndrome. -Continue to monitor BMP, magnesium and phosphate.  Depression.  Patient appears depressed.  Per wife he is feeling very down since the death of his father and drinking a lot in order to overcome his depression. -Psychiatry consult.  Palliative care consult.  Mechele Collin if care was consulted to discuss goals of care.  Patient and his wife will continue aggressive measures and to treat treatables.  Code was changed to DNR.  Patient is critically ill with multiorgan involvement and is very high risk for deterioration and death.   Objective: Vitals:   2020/06/06 0500  06/06/20 0600 Jun 06, 2020 0800 06-06-20 1200  BP: 132/75 123/76    Pulse: (!) 122 (!) 129    Resp: (!) 25 (!) 28    Temp:   99.4 F (37.4 C) 99.2 F (37.3 C)  TempSrc:   Oral Oral  SpO2: 95% 95%    Weight:      Height:        Intake/Output Summary (Last 24 hours) at June 06, 2020 1443 Last data filed at June 06, 2020 7026 Gross per 24 hour  Intake --  Output 2550 ml  Net -2550 ml   Filed Weights   05/03/20 2232 05/16/20 0030  Weight: 63.5 kg 68.3 kg    Examination:  General exam: Tonically ill-appearing, tearful gentleman, in no acute distress. Respiratory system: Clear to auscultation. Respiratory effort normal. Cardiovascular system: S1 & S2 heard, RRR. No JVD, murmurs, rubs, gallops or clicks. Gastrointestinal system: Soft, nontender, bowel sounds positive. Central nervous system: Alert and oriented. No focal neurological deficits. Extremities: 3+ LE edema, no cyanosis, pulses intact and symmetrical. Skin: Erythematous rash involving bilateral lower extremities and upper extremities. Psychiatry: Judgement and insight appear normal.   DVT prophylaxis:  Code Status:  Family Communication:  Disposition Plan:  Status is: Inpatient  Remains inpatient appropriate because:Inpatient level of care appropriate due to severity of illness   Dispo: The patient is from: Home              Anticipated d/c is to: To be determined              Anticipated d/c date is: > 3 days              Patient currently is not medically stable to d/c.  Consultants:   Neurology  Gastroenterology  General surgery  Palliative care  PCCM  Rheumatology  Dermatology  Procedures:  Antimicrobials:  Meropenem  Data Reviewed: I have personally reviewed following labs and imaging studies  CBC: Recent Labs  Lab 05/15/20 2006 05/16/20 0331 05/18/20 0517 05/18/20 1830 05/19/20 0214 05/20/20 0439 05/21/20 0434  WBC 34.2*   < > 27.0* 19.2* 16.9* 15.9* 15.7*  NEUTROABS 31.7*  --   --   --    --   --  11.9*  HGB 9.1*   < > 7.0* 9.8* 9.7* 8.1* 7.6*  HCT 26.4*   < > 20.0* 28.6* 28.0* 24.3* 23.0*  MCV 95.0   < > 93.0 90.2 90.3 91.7 92.4  PLT 126*   < > 121* 113* 94* 77* 79*   < > = values in this interval not displayed.   Basic Metabolic Panel: Recent Labs  Lab 05/17/20 0535 05/18/20 0517 05/19/20 0214 05/20/20 0439 05/21/20 0434  NA 139 136 133*   132* 137 137  K 3.6 3.6 4.6   4.5 4.9 5.0  CL 106 105 101   101 103 103  CO2 19* 26 25   25 28 28   GLUCOSE 139* 135* 145*   145* 149* 133*  BUN 24* 15 13   13 14 18   CREATININE 1.18 0.81 0.69   0.72 0.58* 0.57*  CALCIUM 5.5* 6.8* 7.7*   7.6* 8.1* 8.3*  MG 1.7 2.3 1.9 1.7 1.7  PHOS 4.9* 4.0 3.4 3.9 4.7*   GFR: Estimated Creatinine Clearance: 121.6 mL/min (A) (by C-G formula based on SCr of 0.57 mg/dL (L)). Liver Function Tests: Recent Labs  Lab 05/15/20 2006 05/15/20 2006 05/16/20 0331 05/16/20 0331 05/17/20 0535 05/18/20 0517 05/19/20 0214 05/20/20 0439 05/21/20 0434  AST 39  --  36  --  43* 36  --   --  115*  ALT 24  --  21  --  20 18  --   --  61*  ALKPHOS 79  --  71  --  99 86  --   --  188*  BILITOT 0.7  --  0.8  --  0.6 0.4  --   --  0.5  PROT 4.1*  --  4.9*  --  4.3* 4.1*  --   --  4.6*  ALBUMIN 1.4*   < > 2.8*   < > 1.8* 1.5* 1.6* 1.4* 1.4*   < > = values in this interval not displayed.   Recent Labs  Lab 05/15/20 0205 05/16/20 0331 05/17/20 0535 05/18/20 1830  LIPASE 1,141* 912* 337* 171*   Recent Labs  Lab 05/15/20 2006  AMMONIA 17   Coagulation Profile: No results for input(s): INR, PROTIME in the last 168 hours. Cardiac Enzymes: No results for input(s): CKTOTAL, CKMB, CKMBINDEX, TROPONINI in the last 168 hours. BNP (last 3 results) No results for input(s): PROBNP in the last 8760 hours. HbA1C: No results for input(s):  HGBA1C in the last 72 hours. CBG: Recent Labs  Lab 05/19/20 2356 05/20/20 0545 05/20/20 1158 05/20/20 2348 05/21/20 0558  GLUCAP 157* 143* 129* 137* 120*    Lipid Profile: Recent Labs    05/21/20 0434  TRIG 66   Thyroid Function Tests: No results for input(s): TSH, T4TOTAL, FREET4, T3FREE, THYROIDAB in the last 72 hours. Anemia Panel: No results for input(s): VITAMINB12, FOLATE, FERRITIN, TIBC, IRON, RETICCTPCT in the last 72 hours. Sepsis Labs: Recent Labs  Lab 05/15/20 2223 05/16/20 0327 05/17/20 0535 05/18/20 0517 05/21/20 0434  PROCALCITON  --  1.84 1.37 0.84 1.03  LATICACIDVEN 2.0* 1.1  --   --   --     Recent Results (from the past 240 hour(s))  CULTURE, BLOOD (ROUTINE X 2) w Reflex to ID Panel     Status: None   Collection Time: 05/15/20 11:52 PM   Specimen: Left Antecubital; Blood  Result Value Ref Range Status   Specimen Description LEFT ANTECUBITAL  Final   Special Requests   Final    BOTTLES DRAWN AEROBIC AND ANAEROBIC Blood Culture adequate volume   Culture   Final    NO GROWTH 5 DAYS Performed at [facility], [address] [place], [place] [address]    Report Status 05/21/2020 FINAL  Final  CULTURE, BLOOD (ROUTINE X 2) w Reflex to ID Panel     Status: None   Collection Time: 05/15/20 11:53 PM   Specimen: BLOOD LEFT HAND  Result Value Ref Range Status   Specimen Description BLOOD LEFT HAND  Final   Special Requests   Final    BOTTLES DRAWN AEROBIC AND ANAEROBIC Blood Culture adequate volume   Culture   Final    NO GROWTH 5 DAYS Performed at [facility], [address] [place], [place] [address]    Report Status 05/21/2020 FINAL  Final  MRSA PCR Screening     Status: None   Collection Time: 05/16/20 12:24 AM   Specimen: Nasopharyngeal  Result Value Ref Range Status   MRSA by PCR NEGATIVE NEGATIVE Final    Comment:        The GeneXpert MRSA Assay (FDA approved for NASAL specimens only), is one component of a comprehensive MRSA colonization surveillance program. It is not intended to diagnose MRSA infection nor to guide or monitor treatment for MRSA infections. Performed  at [facility], [address], [place], [place] [address]   Urine Culture     Status: None   Collection Time: 05/16/20  3:27 AM   Specimen: Urine, Random  Result Value Ref Range Status   Specimen Description   Final    URINE, RANDOM Performed at [facility], [address] [place], [place] [address]    Special Requests   Final    NONE Performed at [facility], [address] [place], [place] [address]    Culture   Final    NO GROWTH Performed at [facility], [address], [place], [place] [address]    Report Status 05/17/2020 FINAL  Final  Gastrointestinal Panel by PCR , Stool     Status: None   Collection Time: 05/17/20  3:06 PM   Specimen: Stool  Result Value Ref Range Status   Campylobacter species NOT DETECTED NOT DETECTED Final   Plesimonas shigelloides NOT DETECTED NOT DETECTED Final   Salmonella species NOT DETECTED NOT DETECTED Final   Yersinia enterocolitica NOT DETECTED NOT DETECTED Final   Vibrio species  NOT DETECTED NOT DETECTED Final   Vibrio cholerae NOT DETECTED NOT DETECTED Final   Enteroaggregative E coli (EAEC) NOT DETECTED NOT DETECTED Final   Enteropathogenic E coli (EPEC) NOT DETECTED NOT DETECTED Final   Enterotoxigenic E coli (ETEC) NOT DETECTED NOT DETECTED Final   Shiga like toxin producing E coli (STEC) NOT DETECTED NOT DETECTED Final   Shigella/Enteroinvasive E coli (EIEC) NOT DETECTED NOT DETECTED Final   Cryptosporidium NOT DETECTED NOT DETECTED Final   Cyclospora cayetanensis NOT DETECTED NOT DETECTED Final   Entamoeba histolytica NOT DETECTED NOT DETECTED Final   Giardia lamblia NOT DETECTED NOT DETECTED Final   Adenovirus F40/41 NOT DETECTED NOT DETECTED Final   Astrovirus NOT DETECTED NOT DETECTED Final   Norovirus GI/GII NOT DETECTED NOT DETECTED Final   Rotavirus A NOT DETECTED NOT DETECTED Final   Sapovirus (I, II, IV, and V) NOT DETECTED NOT DETECTED Final    Comment: Performed at [facility], [address] [place], [place] [address]  C Difficile Quick Screen (NO PCR Reflex)     Status: None   Collection Time: 05/17/20  3:06 PM   Specimen: STOOL  Result Value Ref Range Status   C Diff antigen NEGATIVE NEGATIVE Final   C Diff toxin NEGATIVE NEGATIVE Final   C Diff interpretation No C. difficile detected.  Final    Comment: Performed at [facility], [address] [place], [place] [address]     Radiology Studies: CT VENOGRAM HEAD  Result Date: 05/20/2020 CLINICAL DATA:  Follow-up dural venous thrombosis. Subacute infarctions right posterior parietal lobe and right cerebellum. EXAM: CT VENOGRAM HEAD TECHNIQUE: CT venography performed and compared with previous studies beginning 05/03/2020. CONTRAST:  75mL OMNIPAQUE IOHEXOL 350 MG/ML SOLN COMPARISON:  Multiple previous studies beginning 05/03/2020. FINDINGS: Subtle low density in the right cerebellum at the site recent acute infarction. Subtle loss of gray-white differentiation in the right parietal region consistent with the area of acute cortical infarction. No swelling or hemorrhage. No mass, hydrocephalus or extra-axial collection. Superior sagittal sinus remains patent to the torcula where there is residual intraluminal thrombus as seen previously. Thrombosis of the left transverse sinus with a small amount of peripheral flow as seen previously. Sigmoid sinus and jugular vein on the left appear normal. Right transverse sinus, sigmoid sinus and jugular vein are patent. IMPRESSION: Similar appearance with intraluminal thrombus in the torcula and left transverse sinus. There is a small amount of peripheral flow around thrombus. Subtle low density in the right cerebellum and at the gray-white junction of the right parietal lobe consistent with the areas acute infarction shown by MRI. Electronically Signed   By: Paulina FusiMark  Shogry M.D.   On: 05/20/2020 13:22    Scheduled Meds:  Chlorhexidine Gluconate Cloth  6 each  Topical Daily   Gerhardt's butt cream   Topical TID   insulin aspart  0-9 Units Subcutaneous Q6H   metoprolol tartrate  12.5 mg Oral BID   pantoprazole (PROTONIX) IV  40 mg Intravenous Q12H   sodium chloride flush  3 mL Intravenous Q12H   Continuous Infusions:  sodium chloride Stopped (05/13/20 1628)   heparin 1,850 Units/hr (05/21/20 1337)   meropenem (MERREM) IV 1 g (05/21/20 0839)   norepinephrine (LEVOPHED) Adult infusion Stopped (05/18/20 1526)   TPN ADULT (ION) 85 mL/hr at 05/20/20 1817   TPN ADULT (ION)       LOS: 18 days   Time spent: 2 hours.  I personally reviewed his chart, labs and imaging.  Arnetha Courser, MD [facility]  If 7PM-7AM, please contact night-coverage [email or web address]  05/21/2020, 2:43 PM   This record has been created using Conservation officer, historic buildings. Errors have been sought and corrected,but may not always be located. Such creation errors do not reflect on the standard of care.

## 2020-05-21 NOTE — Progress Notes (Signed)
Pharmacy Antibiotic Note  Shane Haubner Sr. is a 34 y.o. male admitted on 05/03/2020 with acute pancreatitis with pseudocysts and cellulitis. Pharmacy has been consulted for meropenem for intra-abdominal infection and now vancomycin dosing for cellulitis. WBC down-trending from peak of 40.4 and to 15.7 today. Cultures have been negative to date.  Plan: Meropenem 1gm IV q8hrs   Vancomycin LD of 1500 mg IV   Followed by maintenance regimen of vancomycin 1250 mg q12h per nomogram --Goal trough 10-15 for cellulitis --Daily Scr per protocol  Height: 5\' 7"  (170.2 cm) Weight: 68.3 kg (150 lb 9.2 oz) IBW/kg (Calculated) : 66.1  Temp (24hrs), Avg:98.9 F (37.2 C), Min:97.6 F (36.4 C), Max:99.6 F (37.6 C)  Recent Labs  Lab 05/15/20 2223 05/16/20 0327 05/16/20 0331 05/17/20 0535 05/18/20 0032 05/18/20 0517 05/18/20 1830 05/19/20 0214 05/20/20 0439 05/21/20 0434  WBC  --   --    < > 39.8*   < > 27.0* 19.2* 16.9* 15.9* 15.7*  CREATININE  --   --    < > 1.18  --  0.81  --  0.69  0.72 0.58* 0.57*  LATICACIDVEN 2.0* 1.1  --   --   --   --   --   --   --   --    < > = values in this interval not displayed.    Estimated Creatinine Clearance: 121.6 mL/min (A) (by C-G formula based on SCr of 0.57 mg/dL (L)).    Allergies  Allergen Reactions  . Sudafed [Pseudoephedrine]     History per pt of this being linked with his seizures    Antimicrobials this admission: Meropenem 6/28 >> 7/1, 7/7 >>  Zosyn 7/4 >> 7/7 Vancomycin 7/12  >>   Dose adjustments this admission: n/a  Microbiology results: 6/28 Peritoneal fluid: NG x 3 days 7/2 BCx (1-set): NG x 5 days 7/6 BCx: NG x 5 days 7/7 UCx: No growth 7/7 MRSA PCR: negative 7/8 C diff: negative 7/8 GI panel: negative  Thank you for allowing pharmacy to be a part of this patient's care.  9/7 05/21/2020 9:33 AM

## 2020-05-21 NOTE — Progress Notes (Signed)
Wyline Mood , MD [address], [address], [place], [place], [address] [address], [address], [place], [place], [address] Phone: [phone]  Fax: [phone]   Daemien Fronczak Sr. is being followed for anemia    Subjective: Sleeping , not in any pain or distress   Objective: Vital signs in last 24 hours: Vitals:   05/21/20 0500 05/21/20 0600 05/21/20 0800 05/21/20 1200  BP: 132/75 123/76    Pulse: (!) 122 (!) 129    Resp: (!) 25 (!) 28    Temp:   99.4 F (37.4 C) 99.2 F (37.3 C)  TempSrc:   Oral Oral  SpO2: 95% 95%    Weight:      Height:       Weight change:   Intake/Output Summary (Last 24 hours) at 05/21/2020 1536 Last data filed at 05/21/2020 0086 Gross per 24 hour  Intake --  Output 2550 ml  Net -2550 ml     Exam: Heart:: Regular rate and rhythm, S1S2 present or without murmur or extra heart sounds Lungs: normal, clear to auscultation and clear to auscultation and percussion Abdomen: mild distension , no guarding or rigidity ,BS+   Lab Results: @LABTEST2 @ Micro Results: Recent Results (from the past 240 hour(s))  CULTURE, BLOOD (ROUTINE X 2) w Reflex to ID Panel     Status: None   Collection Time: 05/15/20 11:52 PM   Specimen: Left Antecubital; Blood  Result Value Ref Range Status   Specimen Description LEFT ANTECUBITAL  Final   Special Requests   Final    BOTTLES DRAWN AEROBIC AND ANAEROBIC Blood Culture adequate volume   Culture   Final    NO GROWTH 5 DAYS Performed at [facility], [address] [place], [place]    Report Status 05/21/2020 FINAL  Final  CULTURE, BLOOD (ROUTINE X 2) w Reflex to ID Panel     Status: None   Collection Time: 05/15/20 11:53 PM   Specimen: BLOOD LEFT HAND  Result Value Ref Range Status   Specimen Description BLOOD LEFT HAND  Final   Special Requests   Final    BOTTLES DRAWN AEROBIC AND ANAEROBIC Blood Culture adequate volume   Culture   Final    NO GROWTH 5 DAYS Performed at [facility], [address] [place], [place]    Report Status 05/21/2020 FINAL  Final  MRSA PCR Screening     Status: None   Collection Time: 05/16/20 12:24 AM   Specimen: Nasopharyngeal  Result Value Ref Range Status   MRSA by PCR NEGATIVE NEGATIVE Final    Comment:        The GeneXpert MRSA Assay (FDA approved for NASAL specimens only), is one component of a comprehensive MRSA colonization surveillance program. It is not intended to diagnose MRSA infection nor to guide or monitor treatment for MRSA infections. Performed at [facility], [address] [place], [place]   Urine Culture     Status: None   Collection Time: 05/16/20  3:27 AM   Specimen: Urine, Random  Result Value Ref Range Status   Specimen Description   Final    URINE, RANDOM Performed at [facility], [address], [place], [place]    Special Requests   Final    NONE Performed at [facility], [address], [place], [place]    Culture   Final    NO GROWTH Performed at [facility], [address] [place], [place] [address]    Report Status 05/17/2020 FINAL  Final  Gastrointestinal Panel by PCR , Stool     Status: None   Collection Time: 05/17/20  3:06 PM   Specimen: Stool  Result Value Ref Range Status   Campylobacter species NOT DETECTED NOT DETECTED Final   Plesimonas shigelloides NOT DETECTED NOT DETECTED Final   Salmonella species NOT DETECTED NOT DETECTED Final   Yersinia enterocolitica NOT DETECTED NOT DETECTED Final   Vibrio species NOT DETECTED NOT DETECTED Final   Vibrio cholerae NOT DETECTED NOT DETECTED Final   Enteroaggregative E coli (EAEC) NOT DETECTED NOT DETECTED Final   Enteropathogenic E coli (EPEC) NOT DETECTED NOT DETECTED Final   Enterotoxigenic E coli (ETEC) NOT DETECTED NOT DETECTED Final   Shiga like toxin producing E coli (STEC) NOT DETECTED NOT DETECTED Final   Shigella/Enteroinvasive E  coli (EIEC) NOT DETECTED NOT DETECTED Final   Cryptosporidium NOT DETECTED NOT DETECTED Final   Cyclospora cayetanensis NOT DETECTED NOT DETECTED Final   Entamoeba histolytica NOT DETECTED NOT DETECTED Final   Giardia lamblia NOT DETECTED NOT DETECTED Final   Adenovirus F40/41 NOT DETECTED NOT DETECTED Final   Astrovirus NOT DETECTED NOT DETECTED Final   Norovirus GI/GII NOT DETECTED NOT DETECTED Final   Rotavirus A NOT DETECTED NOT DETECTED Final   Sapovirus (I, II, IV, and V) NOT DETECTED NOT DETECTED Final    Comment: Performed at [facility], [address] [place], [place] [address]  C Difficile Quick Screen (NO PCR Reflex)     Status: None   Collection Time: 05/17/20  3:06 PM   Specimen: STOOL  Result Value Ref Range Status   C Diff antigen NEGATIVE NEGATIVE Final   C Diff toxin NEGATIVE NEGATIVE Final   C Diff interpretation No C. difficile detected.  Final    Comment: Performed at [facility], 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 EXAM: CT VENOGRAM HEAD TECHNIQUE: CT venography performed and compared with previous studies beginning 05/03/2020. CONTRAST:  99mL OMNIPAQUE IOHEXOL 350 MG/ML SOLN COMPARISON:  Multiple previous studies beginning 05/03/2020. FINDINGS: Subtle low density in the right cerebellum at the site recent acute infarction. Subtle loss of gray-white differentiation in the right parietal region consistent with the area of acute cortical infarction. No swelling or hemorrhage. No mass, hydrocephalus or extra-axial collection. Superior sagittal sinus remains patent to the torcula where there is residual intraluminal thrombus as seen previously. Thrombosis of the left transverse sinus with a small amount of peripheral flow as seen previously. Sigmoid sinus and  jugular vein on the left appear normal. Right transverse sinus, sigmoid sinus and jugular vein are patent. IMPRESSION: Similar appearance with intraluminal thrombus in the torcula and left transverse sinus. There is a small amount of peripheral flow around thrombus. Subtle low density in the right cerebellum and at the gray-white junction of the right parietal lobe consistent with the areas acute infarction shown by MRI. Electronically Signed   By: Paulina Fusi M.D.   On: 05/20/2020 13:22   Medications: I have reviewed the patient's current medications. Scheduled Meds: . Chlorhexidine Gluconate Cloth  6 each Topical Daily  . Gerhardt's butt cream   Topical TID  . insulin aspart  0-9 Units Subcutaneous Q6H  . metoprolol tartrate  12.5 mg Oral BID  .  pantoprazole (PROTONIX) IV  40 mg Intravenous Q12H  . sodium chloride flush  3 mL Intravenous Q12H   Continuous Infusions: . sodium chloride Stopped (05/13/20 1628)  . heparin 1,850 Units/hr (05/21/20 1337)  . meropenem (MERREM) IV 1 g (05/21/20 0839)  . TPN ADULT (ION) 85 mL/hr at 05/20/20 1817  . TPN ADULT (ION)     PRN Meds:.sodium chloride, hydrALAZINE, HYDROmorphone (DILAUDID) injection, labetalol, magic mouthwash **AND** lidocaine, loperamide, LORazepam, ondansetron **OR** ondansetron (ZOFRAN) IV   Assessment: Principal Problem:   Acute alcoholic pancreatitis Active Problems:   Alcohol withdrawal seizure with delirium (HCC)   Hyponatremia   Hypokalemia   AKI (acute kidney injury) (HCC)   Elevated CK   Heme positive stool   Thrombocytopenia (HCC)   Acute metabolic encephalopathy   Severe protein-calorie malnutrition (HCC)   Sinus tachycardia   Ascites due to alcoholic hepatitis   Shane Jenkins Sr. 34 y.o. male with history of alcohol abuse admitted with acute pancreatitis , pseudocysts, ascites , seizures , dural venous sinus thrombosis , severe calorie malnutrition . I have been asked to see if he has been having any GI blood  losses. I have spoken to the nurses and he has had dark brown stool and no melena even today . HB was 9.7 grams and has dropped to 7.6 grams, at the same time his creatinine was 0.81 which has also dropped by 40% to 0.57.   With no overt blood loss seen am less concerned for a GI hemorrhage . Portion of the drop could be related to hemodilution. Since he is on anticoagulation any GI bleed should be overtly visible.   Plan: 1. Monitor color of the stool- if black or red then suggests blood. Do not order or interpret a stool occult test which is a test for colon cancer screening in the outpatient and not useful to rule in or out a GI bleed.   2. IF further drop in Hb noted and no overt bleeding seen then consider intraabdominal blood loss which can at times occur in pancreatitis.   3. Presently no indication for any endoscopy    LOS: 18 days   Wyline Mood, MD 05/21/2020, 3:36 PM

## 2020-05-21 NOTE — Consult Note (Signed)
ANTICOAGULATION CONSULT NOTE  Pharmacy Consult for Heparin infusion  Indication: cerebral venous thrombosis  Patient Measurements: Height: 5\' 7"  (170.2 cm) Weight: 68.3 kg (150 lb 9.2 oz) IBW/kg (Calculated) : 66.1  Vital Signs: Temp: 99.4 F (37.4 C) (07/12 0800) Temp Source: Oral (07/12 0800) BP: 123/76 (07/12 0600) Pulse Rate: 129 (07/12 0600)  Labs: Recent Labs    05/19/20 0214 05/19/20 0955 05/20/20 0439 05/21/20 0434 05/21/20 1218  HGB 9.7*  --  8.1* 7.6*  --   HCT 28.0*  --  24.3* 23.0*  --   PLT 94*  --  77* 79*  --   HEPARINUNFRC 0.25*   < > 0.32 0.28* 0.29*  CREATININE 0.69  0.72  --  0.58* 0.57*  --    < > = values in this interval not displayed.    Estimated Creatinine Clearance: 121.6 mL/min (A) (by C-G formula based on SCr of 0.57 mg/dL (L)).   Medical History: Past Medical History:  Diagnosis Date  . Alcohol use disorder, severe, dependence (HCC)    Assessment: Pharmacy consulted for heparin infusion dosing and monitoring for 34 yo male for cerebral venous thrombosis.   CT Head: "Confirmed thrombus within the confluence of sinuses and left transverse dural venous sinus. 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 No evidence of hemorrhagic conversion." H&H, platelets continue to trend down slightliy  07/05  0040 HL 0.20 subtherapeutic - rate increased to 1150 units/hr  07/05  1004 HL 0.70 supratherapeutic - rate decreased to 1000 units/hr 07/05 1804 HL < 0.10: inc to 1150 units/hr 07/06  0205 HL 0.32: no change 07/06  0757 HL 0.54: decreased to 900 units/hr 07/06  2200 HL 0.20: increased to 1100 units/hr 07/07  0802 HL 0.23: increased to 1150 units/hr 07/07  1812 HL 0.31: no change 07/08  0020 HL 0.32: no change 07/08  0718 HL 0.29: increased to 1200 units/hr 07/08  1611 HL 0.12 increase to 1350 units/hr   07/09  0032 HL 0.20:increase to 1450 units/hr   07/09  0752 HL 0.30:increase to 1550 units/hr   07/10 0214 HL 0.25:  increase to 1750 units/hr 07/10 0955 HL 0.33: no change 07/10 1602 HL 0.31: no change 07/11 0439 HL 0.32, therapeutic, CBC worse 07/12 0434 HL 0.28, slightly SUBtherapeutic, H/H worse, PLTs low but stable 0712 1218 HL 0.29, slightly SUBtherapeutic  Goal of Therapy:  Heparin level 0.3-0.5 units/ml Monitor platelets by anticoagulation protocol: Yes   Plan:   Anti-Xa level slightly SUBtherapeutic: increase infusion to 1850 units/hr   Recheck heparin level 6 hours after rate change  F/U CBC in am  09/12 05/21/2020 1:06 PM

## 2020-05-21 NOTE — Progress Notes (Signed)
ID Pt was lethargic over the weekend More laert today On talking to him and his finace he has never been admitted to hsopital before 05/03/20 He has been drinking a case of beer ( 12X 12 ounce cans) for the past 6 months and before less than that. His dad died 6 months ago due to colon cancer His mom died when she was 34 yrs old with colon cancer  C/o painful leg lesions  Patient Vitals for the past 24 hrs:  BP Temp Temp src Pulse Resp SpO2  05/21/20 0800 -- 99.4 F (37.4 C) Oral -- -- --  05/21/20 0600 123/76 -- -- (!) 129 (!) 28 95 %  05/21/20 0500 132/75 -- -- (!) 122 (!) 25 95 %  05/21/20 0400 129/75 99.6 F (37.6 C) Oral (!) 117 (!) 23 93 %  05/21/20 0300 128/76 -- -- (!) 121 (!) 24 94 %  05/21/20 0200 -- -- -- (!) 118 (!) 22 95 %  05/21/20 0100 -- -- -- (!) 117 (!) 22 97 %  05/21/20 0030 (!) 139/92 98.6 F (37 C) Oral (!) 116 (!) 21 96 %  05/20/20 2300 124/69 -- -- (!) 121 (!) 25 99 %  05/20/20 2130 129/74 -- -- (!) 115 20 96 %  05/20/20 2000 126/83 97.6 F (36.4 C) Oral (!) 104 20 98 %  05/20/20 1900 -- -- -- (!) 106 20 98 %  05/20/20 1600 115/75 98.9 F (37.2 C) Oral -- -- --  05/20/20 1200 119/64 99.4 F (37.4 C) Oral -- -- --   O/e Emaciated Chronically ill Pale Chest b/l air entry Decreased bases abd soft B/l leg edema Macular  lesions with central dark spot- couple of them are superficial hemorrhagic blisters                   CBC Latest Ref Rng & Units 05/21/2020 05/20/2020 05/19/2020  WBC 4.0 - 10.5 K/uL 15.7(H) 15.9(H) 16.9(H)  Hemoglobin 13.0 - 17.0 g/dL 7.6(L) 8.1(L) 9.7(L)  Hematocrit 39 - 52 % 23.0(L) 24.3(L) 28.0(L)  Platelets 150 - 400 K/uL 79(L) 77(L) 94(L)    CMP Latest Ref Rng & Units 05/21/2020 05/20/2020 05/19/2020  Glucose 70 - 99 mg/dL 195(K) 932(I) 712(W)  BUN 6 - 20 mg/dL 18 14 13   Creatinine 0.61 - 1.24 mg/dL ) 5.80(D) 9.83(J  Sodium 135 - 145 mmol/L 137 137 133(L)  Potassium 3.5 - 5.1 mmol/L 5.0 4.9 4.6  Chloride 98  - 111 mmol/L 103 103 101  CO2 22 - 32 mmol/L 28 28 25   Calcium 8.9 - 10.3 mg/dL 8.3(L) 8.1(L) 7.7(L)  Total Protein 6.5 - 8.1 g/dL 4.6(L) - -  Total Bilirubin 0.3 - 1.2 mg/dL 0.5 - -  Alkaline Phos 38 - 126 U/L 188(H) - -  AST 15 - 41 U/L 115(H) - -  ALT 0 - 44 U/L 61(H) - -    Impression/recommendation  Impression/Recommendation ? ?Complicated medical history. 34 year old male with excess EtOH abuse, admitted with altered mental status   Cerebral  venous sinus thrombosis- need to figure out the cause of the thrombosis which can happen after injury, coagulation disorder, autoimmune disorder He says he hit his head on the chest of drwas at home but I dont think it was severe enough to cause that ! With new leg lesions which looks like vasculitis/EM/pemphigoid need to r/o Autoimmune cause . Skin lesions- this does not look like infection Vasculitis VS EM VS Pemhigoidal- derm consult  No need for MRSA  coverage  Acute pancreatitis with pseudocysts multiple some of them are loculated with thick walled. Concern for infected pseudocyst especially with increasing leukocytosis. Patient is currently on meropenem and leucocytosis much improved Pancreatic ascites with ascitic fluid having very high amylase and lipase. This is secondary to the pancreatitis or could he have a pancreatic duct fistula?  leukocytosis trending down - continue meropenem If leucocytosis worsens then aspiration of the pseudocyst is needed   Severe anemia a drop of hemoglobin from 15-7.6 raises concern for a GI bleed or hemosuccus pancreaticus   Diarrhea secondary to acute pancreatitis/GI bleed. C. difficile is negative.  Discussed with patient his partner and Dr.Amin

## 2020-05-21 NOTE — Consult Note (Signed)
PHARMACY - TOTAL PARENTERAL NUTRITION CONSULT NOTE   Indication: severe malnutrition  Patient Measurements: Height: 5\' 7"  (170.2 cm) Weight: 68.3 kg (150 lb 9.2 oz) IBW/kg (Calculated) : 66.1 TPN AdjBW (KG): 68.3 Body mass index is 23.58 kg/m.  Assessment: 34 year old male with PMHx of EtOH abuse admitted with alcohol withdrawal, acute alcoholic pancreatitis, acute metabolic encephalopathy, ascites s/p paracentesis on 6/28 with 2.8 L fluid removed, chest pain, acute renal failure.   Glucose / Insulin: sSSI q6h  CBG 120 - 149 (required 4 units SSI) Electrolytes: now WNL Trending up: potassium, phosphorous Renal: SCr 1.75-->0.54-->1.42-->0.58 (stable) LFTs / TGs: AST/ALT: 43/20 U/L at baseline Prealbumin / albumin: albumin 1.4 g/dL (trending down, stable) Intake / Output; MIVF: UOP 2550 mL yesterday, no MIVF GI Imaging: none since TPN began Surgeries / Procedures:  7/7: US-guided paracentesis: 3L removed  Central access: 05/16/20 TPN start date: 05/16/20  Nutritional Goals (per RD recommendation on 05/16/20): kCal: 1900 - 2200 / day, Protein: 95 - 105 grams/day, Fluid: 1900 - 2200 mL/day Goal TPN rate is 85 mL/hr (provides 105 g of protein and 2200 kcals per day)  Current Nutrition:  Regular Diet (remains too lethargic to consume with a contunued poor appetite)  Plan:   continue TPN to goal rate at 85 mL/hr at 1800  AA 51 g/L, dextrose 16%, lipids 32 g/L, 2140 mL total volume  Electrolytes in TPN  Cl:Ac 1:1,  Na: 100, K 30, Ca 5, Mg 15, phosphorous 5 (all mEq/L)  decreased phosphorous  Add standard MVI, 100 mg thiamine, 1 mg folic acid and trace elements to TPN  continue Sensitive q6h SSI and adjust as needed   Monitor TPN labs daily until stable due to high risk for refeeding syndrome  07/17/20 05/21/2020,8:38 AM

## 2020-05-21 NOTE — Consult Note (Signed)
ANTICOAGULATION CONSULT NOTE  Pharmacy Consult for Heparin infusion  Indication: cerebral venous thrombosis  Patient Measurements: Height: 5\' 7"  (170.2 cm) Weight: 68.3 kg (150 lb 9.2 oz) IBW/kg (Calculated) : 66.1  Vital Signs: Temp: 99.6 F (37.6 C) (07/12 0400) Temp Source: Oral (07/12 0400) BP: 132/75 (07/12 0500) Pulse Rate: 122 (07/12 0500)  Labs: Recent Labs    05/19/20 0214 05/19/20 0214 05/19/20 0955 05/19/20 1602 05/20/20 0439 05/21/20 0434  HGB 9.7*   < >  --   --  8.1* 7.6*  HCT 28.0*  --   --   --  24.3* 23.0*  PLT 94*  --   --   --  77* 79*  HEPARINUNFRC 0.25*  --    < > 0.31 0.32 0.28*  CREATININE 0.69   0.72  --   --   --  0.58* 0.57*   < > = values in this interval not displayed.    Estimated Creatinine Clearance: 121.6 mL/min (A) (by C-G formula based on SCr of 0.57 mg/dL (L)).   Medical History: Past Medical History:  Diagnosis Date   Alcohol use disorder, severe, dependence (HCC)    Assessment: Pharmacy consulted for heparin infusion dosing and monitoring for 34 yo male for cerebral venous thrombosis.   CT Head: "Confirmed thrombus within the confluence of sinuses and left transverse dural venous sinus. 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 No evidence of hemorrhagic conversion." H&H, platelets continue to trend down slightliy  07/05  0040 HL 0.20 subtherapeutic - rate increased to 1150 units/hr  07/05  1004 HL 0.70 supratherapeutic - rate decreased to 1000 units/hr 07/05 1804 HL < 0.10: inc to 1150 units/hr 07/06  0205 HL 0.32: no change 07/06  0757 HL 0.54: decreased to 900 units/hr 07/06  2200 HL 0.20: increased to 1100 units/hr 07/07  0802 HL 0.23: increased to 1150 units/hr 07/07  1812 HL 0.31: no change 07/08  0020 HL 0.32: no change 07/08  0718 HL 0.29: increased to 1200 units/hr 07/08  1611 HL 0.12 increase to 1350 units/hr   07/09  0032 HL 0.20:increase to 1450 units/hr   07/09  0752 HL 0.30:increase to  1550 units/hr   07/10 0214 HL 0.25: increase to 1750 units/hr 07/10 0955 HL 0.33: no change 07/10 1602 HL 0.31: no change 07/11 0439 HL 0.32, therapeutic, CBC worse 07/12 0434 HL 0.28, slightly SUBtherapeutic, H/H worse, PLTs low but stable  Goal of Therapy:  Heparin level 0.3-0.5 units/ml Monitor platelets by anticoagulation protocol: Yes   Plan:   Anti-Xa level slightly SUBtherapeutic: increase infusion to 1800 units/hr   recheck heparin level 6 hours after rate change  F/U CBC in am  09/12, PharmD Clinical Pharmacist 05/21/2020 6:02 AM

## 2020-05-21 NOTE — Progress Notes (Signed)
Subjective: Patient remains lethargic.    Objective: Current vital signs: BP 123/76   Pulse (!) 129   Temp 99.4 F (37.4 C) (Oral)   Resp (!) 28   Ht 5\' 7"  (1.702 m)   Wt 68.3 kg   SpO2 95%   BMI 23.58 kg/m  Vital signs in last 24 hours: Temp:  [97.6 F (36.4 C)-99.6 F (37.6 C)] 99.4 F (37.4 C) (07/12 0800) Pulse Rate:  [104-129] 129 (07/12 0600) Resp:  [20-28] 28 (07/12 0600) BP: (115-139)/(69-92) 123/76 (07/12 0600) SpO2:  [93 %-99 %] 95 % (07/12 0600)  Intake/Output from previous day: 07/11 0701 - 07/12 0700 In: -  Out: 2550 [Urine:2550] Intake/Output this shift: No intake/output data recorded. Nutritional status:  Diet Order            Diet regular Room service appropriate? Yes; Fluid consistency: Thin  Diet effective now                 Neurologic Exam: Mental Status: Lethargic but arousable. Dysarthric. Able to follow simple commands with delay.  Cranial Nerves: AV:[id number]:Pupils equal and both reactive to light.   III,IV, VI: extra-ocular motions intact bilaterally V,VII:face symmetric Motor: Moves extremities weakly against gravity Sensory:Responds to noxious stimuli in all extremities  Lab Results: Basic Metabolic Panel: Recent Labs  Lab 05/17/20 0535 05/17/20 0535 05/18/20 0517 05/18/20 0517 05/19/20 0214 05/20/20 0439 05/21/20 0434  NA 139  --  136  --  133*  132* 137 137  K 3.6  --  3.6  --  4.6  4.5 4.9 5.0  CL 106  --  105  --  101  101 103 103  CO2 19*  --  26  --  25  25 28 28   GLUCOSE 139*  --  135*  --  145*  145* 149* 133*  BUN 24*  --  15  --  13  13 14 18   CREATININE 1.18  --  0.81  --  0.69  0.72 0.58* 0.57*  CALCIUM 5.5*   < > 6.8*   < > 7.7*  7.6* 8.1* 8.3*  MG 1.7  --  2.3  --  1.9 1.7 1.7  PHOS 4.9*  --  4.0  --  3.4 3.9 4.7*   < > = values in this interval not displayed.    Liver Function Tests: Recent Labs  Lab 05/15/20 2006 05/15/20 2006 05/16/20 0331 05/16/20 0331 05/17/20 0535 05/18/20 0517  05/19/20 0214 05/20/20 0439 05/21/20 0434  AST 39  --  36  --  43* 36  --   --  115*  ALT 24  --  21  --  20 18  --   --  61*  ALKPHOS 79  --  71  --  99 86  --   --  188*  BILITOT 0.7  --  0.8  --  0.6 0.4  --   --  0.5  PROT 4.1*  --  4.9*  --  4.3* 4.1*  --   --  4.6*  ALBUMIN 1.4*   < > 2.8*   < > 1.8* 1.5* 1.6* 1.4* 1.4*   < > = values in this interval not displayed.   Recent Labs  Lab 05/15/20 0205 05/16/20 0331 05/17/20 0535 05/18/20 1830  LIPASE 1,141* 912* 337* 171*   Recent Labs  Lab 05/15/20 2006  AMMONIA 17    CBC: Recent Labs  Lab 05/15/20 2006 05/16/20 0331 05/18/20 0517 05/18/20 1830 05/19/20  0214 05/20/20 0439 05/21/20 0434  WBC 34.2*   < > 27.0* 19.2* 16.9* 15.9* 15.7*  NEUTROABS 31.7*  --   --   --   --   --  11.9*  HGB 9.1*   < > 7.0* 9.8* 9.7* 8.1* 7.6*  HCT 26.4*   < > 20.0* 28.6* 28.0* 24.3* 23.0*  MCV 95.0   < > 93.0 90.2 90.3 91.7 92.4  PLT 126*   < > 121* 113* 94* 77* 79*   < > = values in this interval not displayed.    Cardiac Enzymes: No results for input(s): CKTOTAL, CKMB, CKMBINDEX, TROPONINI in the last 168 hours.  Lipid Panel: Recent Labs  Lab 05/16/20 1230 05/21/20 0434  TRIG 45 66    CBG: Recent Labs  Lab 05/19/20 2356 05/20/20 0545 05/20/20 1158 05/20/20 2348 05/21/20 0558  GLUCAP 157* 143* 129* 137* 120*    Microbiology: Results for orders placed or performed during the hospital encounter of 05/03/20  SARS Coronavirus 2 by RT PCR (hospital order, performed in [facility] hospital lab) Nasopharyngeal Nasopharyngeal Swab     Status: None   Collection Time: 05/03/20  8:25 PM   Specimen: Nasopharyngeal Swab  Result Value Ref Range Status   SARS Coronavirus 2 NEGATIVE NEGATIVE Final    Comment: (NOTE) SARS-CoV-2 target nucleic acids are NOT DETECTED.  The SARS-CoV-2 RNA is generally detectable in upper and lower respiratory specimens during the acute phase of infection. The lowest concentration of SARS-CoV-2  viral copies this assay can detect is 250 copies / mL. A negative result does not preclude SARS-CoV-2 infection and should not be used as the sole basis for treatment or other patient management decisions.  A negative result may occur with improper specimen collection / handling, submission of specimen other than nasopharyngeal swab, presence of viral mutation(s) within the areas targeted by this assay, and inadequate number of viral copies (<250 copies / mL). A negative result must be combined with clinical observations, patient history, and epidemiological information.  Fact Sheet for Patients:   BoilerBrush.com.cy  Fact Sheet for Healthcare Providers: https://pope.com/  This test is not yet approved or  cleared by the [place] FDA and has been authorized for detection and/or diagnosis of SARS-CoV-2 by FDA under an Emergency Use Authorization (EUA).  This EUA will remain in effect (meaning this test can be used) for the duration of the COVID-19 declaration under Section 564(b)(1) of the Act, 21 U.S.C. section 360bbb-3(b)(1), unless the authorization is terminated or revoked sooner.  Performed at [facility], [address] [place], [place] [address]   MRSA PCR Screening     Status: None   Collection Time: 05/03/20 11:57 PM   Specimen: Nasopharyngeal  Result Value Ref Range Status   MRSA by PCR NEGATIVE NEGATIVE Final    Comment:        The GeneXpert MRSA Assay (FDA approved for NASAL specimens only), is one component of a comprehensive MRSA colonization surveillance program. It is not intended to diagnose MRSA infection nor to guide or monitor treatment for MRSA infections. Performed at [facility], [address], [place], [place] [address]   Body fluid culture     Status: None   Collection Time: 05/07/20 12:00 PM   Specimen: PATH Cytology Peritoneal fluid  Result Value Ref Range Status    Specimen Description   Final    PERITONEAL Performed at [facility], [address] [place], [place] [address]    Special  Requests   Final    PERITONEAL Performed at [facility], [address] [place], [place] [address]    Gram Stain   Final    WBC PRESENT,BOTH PMN AND MONONUCLEAR NO ORGANISMS SEEN CYTOSPIN SMEAR    Culture   Final    NO GROWTH 3 DAYS Performed at [facility], [address], [place], [place] [address]    Report Status 05/11/2020 FINAL  Final  Culture, blood (single) w Reflex to ID Panel     Status: None   Collection Time: 05/11/20  7:59 AM   Specimen: BLOOD  Result Value Ref Range Status   Specimen Description BLOOD LEFT WRIST  Final   Special Requests   Final    BOTTLES DRAWN AEROBIC AND ANAEROBIC Blood Culture adequate volume   Culture   Final    NO GROWTH 5 DAYS Performed at [facility], [address] [place], [place] [address]    Report Status 05/16/2020 FINAL  Final  CULTURE, BLOOD (ROUTINE X 2) w Reflex to ID Panel     Status: None   Collection Time: 05/15/20 11:52 PM   Specimen: Left Antecubital; Blood  Result Value Ref Range Status   Specimen Description LEFT ANTECUBITAL  Final   Special Requests   Final    BOTTLES DRAWN AEROBIC AND ANAEROBIC Blood Culture adequate volume   Culture   Final    NO GROWTH 5 DAYS Performed at [facility], [address] [place], [place] [address]    Report Status 05/21/2020 FINAL  Final  CULTURE, BLOOD (ROUTINE X 2) w Reflex to ID Panel     Status: None   Collection Time: 05/15/20 11:53 PM   Specimen: BLOOD LEFT HAND  Result Value Ref Range Status   Specimen Description BLOOD LEFT HAND  Final   Special Requests   Final    BOTTLES DRAWN AEROBIC AND ANAEROBIC Blood Culture adequate volume   Culture   Final    NO GROWTH 5 DAYS Performed at [facility], [address] [place], [place] [address]    Report Status 05/21/2020 FINAL  Final  MRSA PCR  Screening     Status: None   Collection Time: 05/16/20 12:24 AM   Specimen: Nasopharyngeal  Result Value Ref Range Status   MRSA by PCR NEGATIVE NEGATIVE Final    Comment:        The GeneXpert MRSA Assay (FDA approved for NASAL specimens only), is one component of a comprehensive MRSA colonization surveillance program. It is not intended to diagnose MRSA infection nor to guide or monitor treatment for MRSA infections. Performed at [facility], [address] [place], [place] [address]   Urine Culture     Status: None   Collection Time: 05/16/20  3:27 AM   Specimen: Urine, Random  Result Value Ref Range Status   Specimen Description   Final    URINE, RANDOM Performed at [facility], [address], [place], [place] [address]    Special Requests   Final    NONE Performed at [facility], [address] [place], [place] [address]    Culture   Final    NO GROWTH Performed at [facility], [address], [place], [place] [address]    Report Status 05/17/2020 FINAL  Final  Gastrointestinal Panel by PCR , Stool     Status: None   Collection Time: 05/17/20  3:06 PM   Specimen: Stool  Result Value Ref Range Status   Campylobacter species NOT DETECTED NOT DETECTED Final   Plesimonas shigelloides NOT DETECTED NOT DETECTED Final   Salmonella species NOT DETECTED NOT DETECTED Final   Yersinia enterocolitica NOT DETECTED NOT DETECTED Final   Vibrio species NOT DETECTED NOT DETECTED Final   Vibrio cholerae NOT DETECTED NOT DETECTED Final   Enteroaggregative E coli (EAEC) NOT DETECTED NOT DETECTED Final   Enteropathogenic E coli (EPEC) NOT DETECTED NOT DETECTED Final   Enterotoxigenic E coli (ETEC) NOT DETECTED NOT DETECTED Final   Shiga like toxin producing E coli (STEC) NOT DETECTED NOT DETECTED Final   Shigella/Enteroinvasive E coli (EIEC) NOT DETECTED NOT DETECTED Final   Cryptosporidium NOT DETECTED NOT DETECTED Final   Cyclospora  cayetanensis NOT DETECTED NOT DETECTED Final   Entamoeba histolytica NOT DETECTED NOT DETECTED Final   Giardia lamblia NOT DETECTED NOT DETECTED Final   Adenovirus F40/41 NOT DETECTED NOT DETECTED Final   Astrovirus NOT DETECTED NOT DETECTED Final   Norovirus GI/GII NOT DETECTED NOT DETECTED Final   Rotavirus A NOT DETECTED NOT DETECTED Final   Sapovirus (I, II, IV, and V) NOT DETECTED NOT DETECTED Final    Comment: Performed at [facility] [address] [place], [place] [address]  C Difficile Quick Screen (NO PCR Reflex)     Status: None   Collection Time: 05/17/20  3:06 PM   Specimen: STOOL  Result Value Ref Range Status   C Diff antigen NEGATIVE NEGATIVE Final   C Diff toxin NEGATIVE NEGATIVE Final   C Diff interpretation No C. difficile detected.  Final    Comment: Performed at [facility], [address] [place], [place] [address]    Coagulation Studies: No results for input(s): LABPROT, INR in the last 72 hours.  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 EXAM: CT VENOGRAM HEAD TECHNIQUE: CT venography performed and compared with previous studies beginning 05/03/2020. CONTRAST:  68mL OMNIPAQUE IOHEXOL 350 MG/ML SOLN COMPARISON:  Multiple previous studies beginning 05/03/2020. FINDINGS: Subtle low density in the right cerebellum at the site recent acute infarction. Subtle loss of gray-white differentiation in the right parietal region consistent with the area of acute cortical infarction. No swelling or hemorrhage. No mass, hydrocephalus or extra-axial collection. Superior sagittal sinus remains patent to the torcula where there is residual intraluminal thrombus as seen previously. Thrombosis of the left transverse sinus with a small amount of peripheral flow as seen previously. Sigmoid sinus and jugular vein on the left appear  normal. Right transverse sinus, sigmoid sinus and jugular vein are patent. IMPRESSION: Similar appearance with intraluminal thrombus in the torcula and left transverse sinus. There is a small amount of peripheral flow around thrombus. Subtle low density in the right cerebellum and at the gray-white junction of the right parietal lobe consistent with the areas acute infarction shown by MRI. Electronically Signed   By: Paulina Fusi M.D.   On: 05/20/2020 13:22    Medications:  I have reviewed the patient's current medications. Scheduled: . Chlorhexidine Gluconate Cloth  6 each Topical Daily  . Gerhardt's butt cream   Topical TID  . insulin aspart  0-9 Units Subcutaneous Q6H  . pantoprazole (PROTONIX) IV  40 mg Intravenous Q12H  . sertraline  25 mg Oral Daily  . sodium chloride flush  3 mL Intravenous Q12H    Assessment/Plan: 34 y.o.malewith a long history of  ETOH abuse. Abruptly stopped drinking the day before presentation (6/24) and became progressively confused. Was brought to the ED by his wife and while in the ED was witnessed to have a seizure. Patient has not had further seizures but remained confused and lethargic.MRI of the brain personally reviewed and reveals a possible cerebral venous thrombosis as well as abnormal diffusion in the right cerebellum, bilateral parietal lobes and left occipital pole. 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 Thyroid normal. EEGonly significant for slowing. B1normal.Mental statusworsened on 7/6. Repeat MRI of the brain repeatedand showed nonew infarcts. No evidence of hydrocephalus or hemorrhage.Paracentesis performed on7/7. Patient with improved mental statusinitially after paracentesis but has since been more lethargic. No evidence of seizure activity.Repeat CT and CTV shows no evidence of hemorrhage or additional infarcts.  No progression of CVT.    Recommendations: 1.Continue heparin   LOS: 18 days   Thana Farr, MD Neurology [phone] 05/21/2020  12:17 PM

## 2020-05-21 NOTE — Consult Note (Signed)
ANTICOAGULATION CONSULT NOTE  Pharmacy Consult for Heparin infusion  Indication: cerebral venous thrombosis  Patient Measurements: Height: 5\' 7"  (170.2 cm) Weight: 68.3 kg (150 lb 9.2 oz) IBW/kg (Calculated) : 66.1  Vital Signs: Temp: 99.1 F (37.3 C) (07/12 2000) Temp Source: Oral (07/12 2000) BP: 120/76 (07/12 2000) Pulse Rate: 110 (07/12 2000)  Labs: Recent Labs    05/19/20 0214 05/19/20 0955 05/20/20 0439 05/20/20 0439 05/21/20 0434 05/21/20 1218 05/21/20 1454 05/21/20 1935  HGB 9.7*  --  8.1*  --  7.6*  --   --   --   HCT 28.0*  --  24.3*  --  23.0*  --   --   --   PLT 94*  --  77*  --  79*  --   --   --   HEPARINUNFRC 0.25*   < > 0.32   < > 0.28* 0.29*  --  0.33  CREATININE 0.69  0.72  --  0.58*  --  0.57*  --   --   --   CKTOTAL  --   --   --   --   --   --  30*  --    < > = values in this interval not displayed.    Estimated Creatinine Clearance: 121.6 mL/min (A) (by C-G formula based on SCr of 0.57 mg/dL (L)).   Medical History: Past Medical History:  Diagnosis Date  . Alcohol use disorder, severe, dependence (HCC)    Assessment: Pharmacy consulted for heparin infusion dosing and monitoring for 34 yo male for cerebral venous thrombosis.   CT Head: "Confirmed thrombus within the confluence of sinuses and left transverse dural venous sinus. 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 No evidence of hemorrhagic conversion." H&H, platelets continue to trend down slightliy  07/05  0040 HL 0.20 subtherapeutic - rate increased to 1150 units/hr  07/05  1004 HL 0.70 supratherapeutic - rate decreased to 1000 units/hr 07/05 1804 HL < 0.10: inc to 1150 units/hr 07/06  0205 HL 0.32: no change 07/06  0757 HL 0.54: decreased to 900 units/hr 07/06  2200 HL 0.20: increased to 1100 units/hr 07/07  0802 HL 0.23: increased to 1150 units/hr 07/07  1812 HL 0.31: no change 07/08  0020 HL 0.32: no change 07/08  0718 HL 0.29: increased to 1200  units/hr 07/08  1611 HL 0.12 increase to 1350 units/hr   07/09  0032 HL 0.20:increase to 1450 units/hr   07/09  0752 HL 0.30:increase to 1550 units/hr   07/10 0214 HL 0.25: increase to 1750 units/hr 07/10 0955 HL 0.33: no change 07/10 1602 HL 0.31: no change 07/11 0439 HL 0.32, therapeutic, CBC worse 07/12 0434 HL 0.28, slightly SUBtherapeutic, H/H worse, PLTs low but stable 0712 1218 HL 0.29, slightly SUBtherapeutic  Goal of Therapy:  Heparin level 0.3-0.5 units/ml Monitor platelets by anticoagulation protocol: Yes   Plan:  7/12 1935 HL 0.33 therapeutic. Continue heparin drip at 1850 units/hr and recheck HL 7/13 at 0200 to confirm. CBC daily.  8/13, PharmD 05/21/2020 8:40 PM

## 2020-05-22 ENCOUNTER — Inpatient Hospital Stay: Payer: Medicaid Other

## 2020-05-22 DIAGNOSIS — E43 Unspecified severe protein-calorie malnutrition: Secondary | ICD-10-CM | POA: Insufficient documentation

## 2020-05-22 LAB — COMPREHENSIVE METABOLIC PANEL
ALT: 105 U/L — ABNORMAL HIGH (ref 0–44)
AST: 138 U/L — ABNORMAL HIGH (ref 15–41)
Albumin: 1.4 g/dL — ABNORMAL LOW (ref 3.5–5.0)
Alkaline Phosphatase: 169 U/L — ABNORMAL HIGH (ref 38–126)
Anion gap: 7 (ref 5–15)
BUN: 18 mg/dL (ref 6–20)
CO2: 27 mmol/L (ref 22–32)
Calcium: 8.1 mg/dL — ABNORMAL LOW (ref 8.9–10.3)
Chloride: 98 mmol/L (ref 98–111)
Creatinine, Ser: 0.54 mg/dL — ABNORMAL LOW (ref 0.61–1.24)
GFR calc Af Amer: >60 mL/min (ref >60)
GFR calc non Af Amer: >60 mL/min (ref >60)
Glucose, Bld: 131 mg/dL — ABNORMAL HIGH (ref 70–99)
Potassium: 4.7 mmol/L (ref 3.5–5.1)
Sodium: 132 mmol/L — ABNORMAL LOW (ref 135–145)
Total Bilirubin: 0.6 mg/dL (ref 0.3–1.2)
Total Protein: 4.7 g/dL — ABNORMAL LOW (ref 6.5–8.1)

## 2020-05-22 LAB — MAGNESIUM: Magnesium: 1.8 mg/dL (ref 1.7–2.4)

## 2020-05-22 LAB — HEPARIN LEVEL (UNFRACTIONATED): Heparin Unfractionated: 0.37 IU/mL (ref 0.30–0.70)

## 2020-05-22 LAB — CBC
HCT: 20.7 % — ABNORMAL LOW (ref 39.0–52.0)
Hemoglobin: 6.8 g/dL — ABNORMAL LOW (ref 13.0–17.0)
MCH: 30.9 pg (ref 26.0–34.0)
MCHC: 32.9 g/dL (ref 30.0–36.0)
MCV: 94.1 fL (ref 80.0–100.0)
Platelets: 85 10*3/uL — ABNORMAL LOW (ref 150–400)
RBC: 2.2 MIL/uL — ABNORMAL LOW (ref 4.22–5.81)
RDW: 15.2 % (ref 11.5–15.5)
WBC: 19 10*3/uL — ABNORMAL HIGH (ref 4.0–10.5)
nRBC: 0 % (ref 0.0–0.2)

## 2020-05-22 LAB — ANA COMPREHENSIVE PANEL

## 2020-05-22 LAB — VITAMIN D 25 HYDROXY (VIT D DEFICIENCY, FRACTURES): Vit D, 25-Hydroxy: 7.39 ng/mL — ABNORMAL LOW (ref 30–100)

## 2020-05-22 LAB — HEPATITIS PANEL, ACUTE
HCV Ab: NONREACTIVE
Hep A IgM: NONREACTIVE
Hep B C IgM: NONREACTIVE
Hepatitis B Surface Ag: NONREACTIVE

## 2020-05-22 LAB — ANCA TITERS
Atypical P-ANCA titer: <1:20 {titer}
C-ANCA: <1:20 {titer}
P-ANCA: <1:20 {titer}

## 2020-05-22 LAB — GLUCOSE, CAPILLARY
Glucose-Capillary: 134 mg/dL — ABNORMAL HIGH (ref 70–99)
Glucose-Capillary: 88 mg/dL (ref 70–99)
Glucose-Capillary: 133 mg/dL — ABNORMAL HIGH (ref 70–99)

## 2020-05-22 LAB — RPR: RPR Ser Ql: NONREACTIVE

## 2020-05-22 LAB — CA 19-9 (SERIAL): CA 19-9: 81 U/mL — ABNORMAL HIGH (ref 0–35)

## 2020-05-22 LAB — HEPARIN INDUCED PLATELET AB (HIT ANTIBODY): Heparin Induced Plt Ab: 0.109 OD (ref 0.000–0.400)

## 2020-05-22 LAB — CARDIOLIPIN ANTIBODIES, IGG, IGM, IGA
Anticardiolipin IgA: <9 APL U/mL (ref 0–11)
Anticardiolipin IgG: <9 GPL U/mL (ref 0–14)
Anticardiolipin IgM: <9 MPL U/mL (ref 0–12)

## 2020-05-22 LAB — PROCALCITONIN: Procalcitonin: 1.24 ng/mL

## 2020-05-22 LAB — PHOSPHORUS: Phosphorus: 4.9 mg/dL — ABNORMAL HIGH (ref 2.5–4.6)

## 2020-05-22 LAB — PREPARE RBC (CROSSMATCH)

## 2020-05-22 LAB — CK: Total CK: 23 U/L — ABNORMAL LOW (ref 49–397)

## 2020-05-22 IMAGING — CT CT ABD-PELV W/O CM
2 of 4 series · 15 of 46 positions shown, 17 images · non-contrast
Comparison: [DATE]

CLINICAL DATA: Anemia

EXAM:
CT ABDOMEN AND PELVIS WITHOUT CONTRAST
TECHNIQUE: Multidetector CT imaging of the abdomen and pelvis was performed
following the standard protocol without IV contrast.

[Series 2: routine abd/pel wo · axial · 0.80mm/px · z∈[-1205,-710]mm · 12 of 109 slices shown, 14 images]
[im 5/109  soft-tissue]
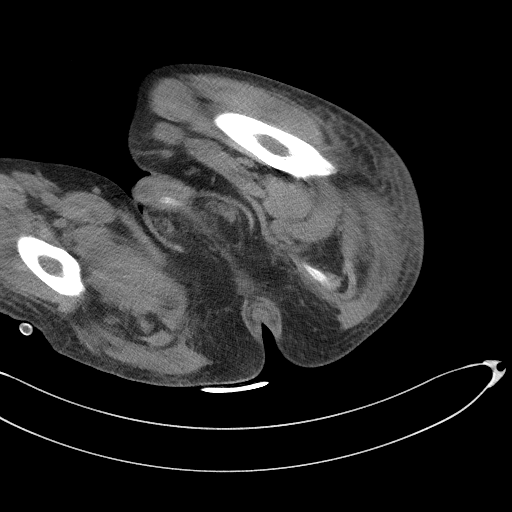
[im 5/109  bone]
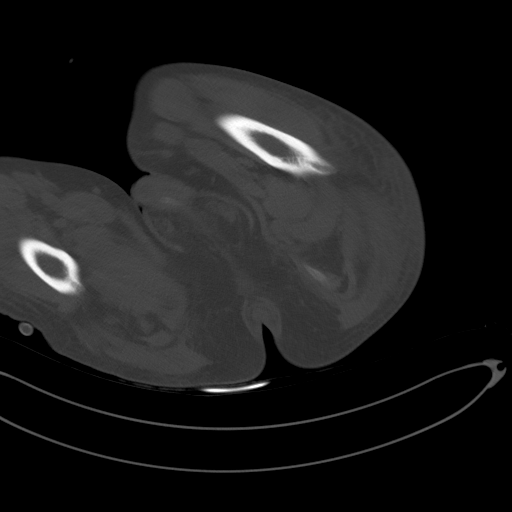
[im 15/109  soft-tissue]
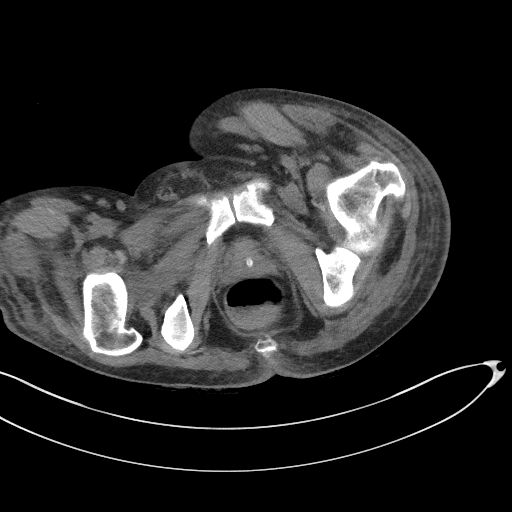
[im 24/109  soft-tissue]
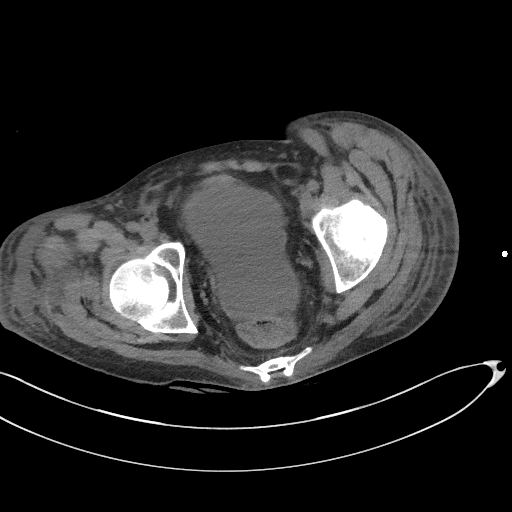
[im 33/109  soft-tissue]
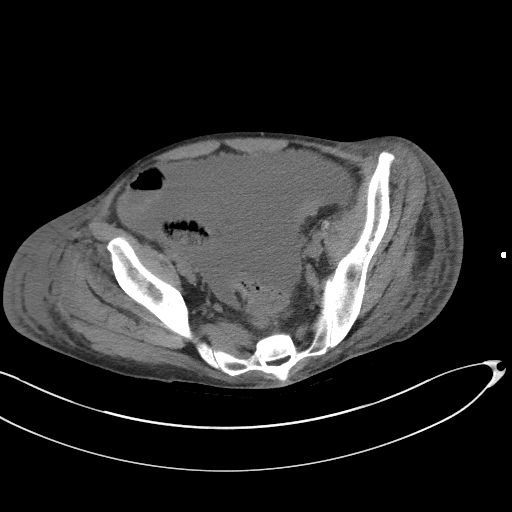
[im 43/109  soft-tissue]
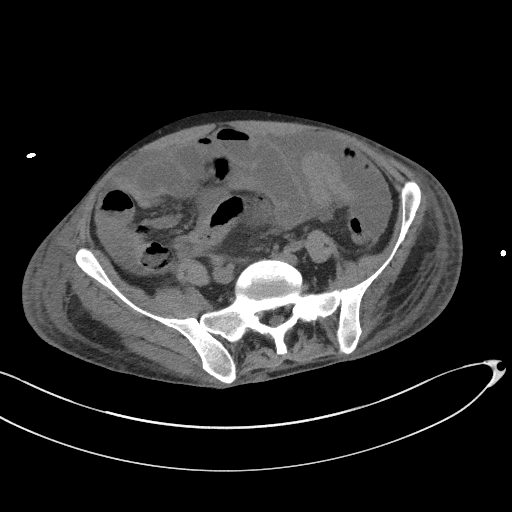
[im 52/109  soft-tissue]
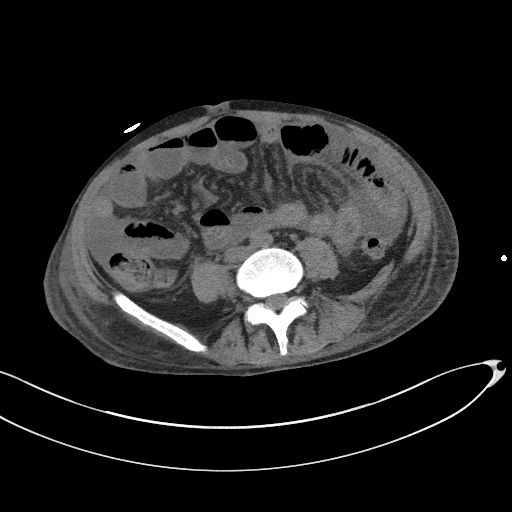
[im 57/109  soft-tissue]
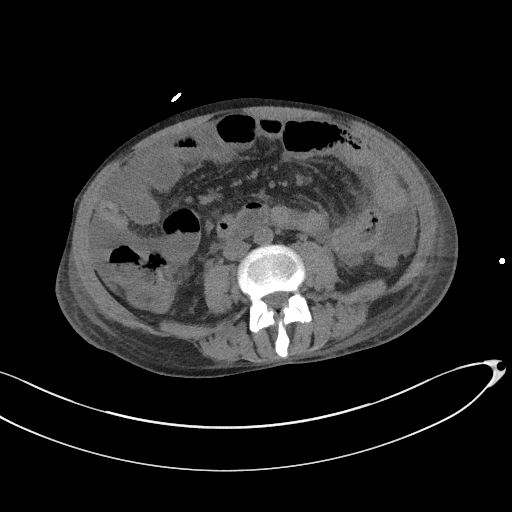
[im 66/109  soft-tissue]
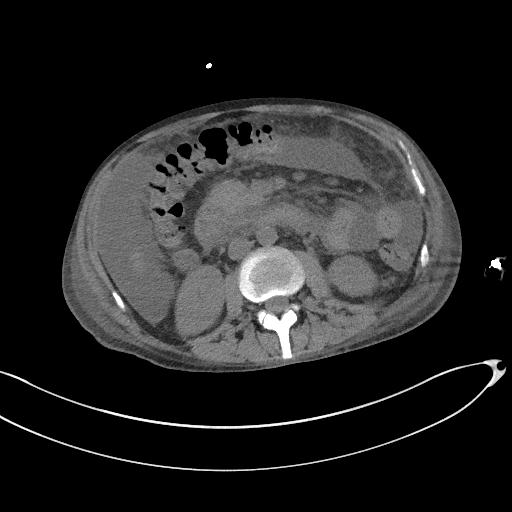
[im 76/109  soft-tissue]
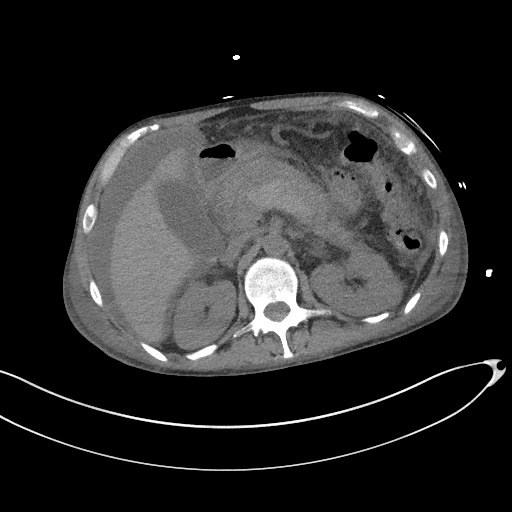
[im 76/109  bone]
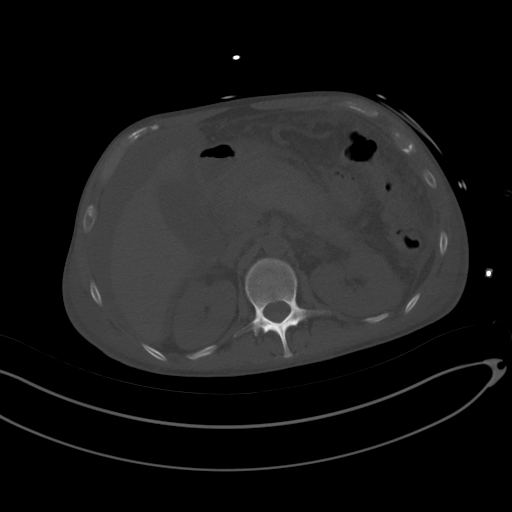
[im 85/109  soft-tissue]
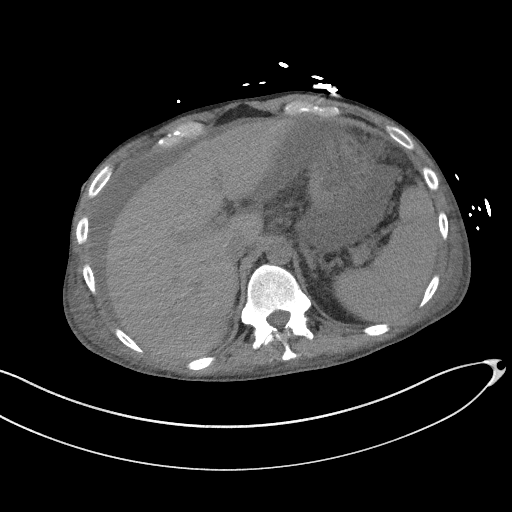
[im 94/109  soft-tissue]
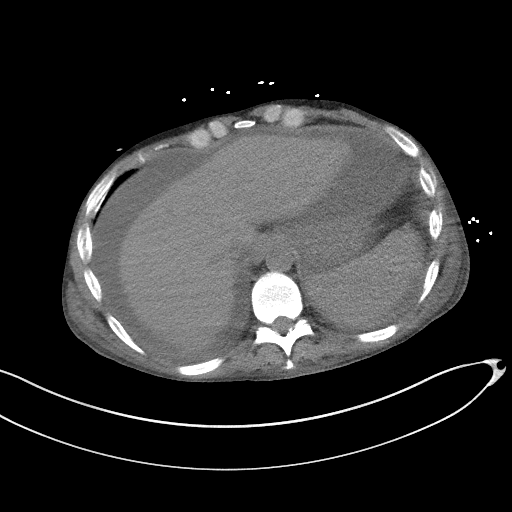
[im 104/109  soft-tissue]
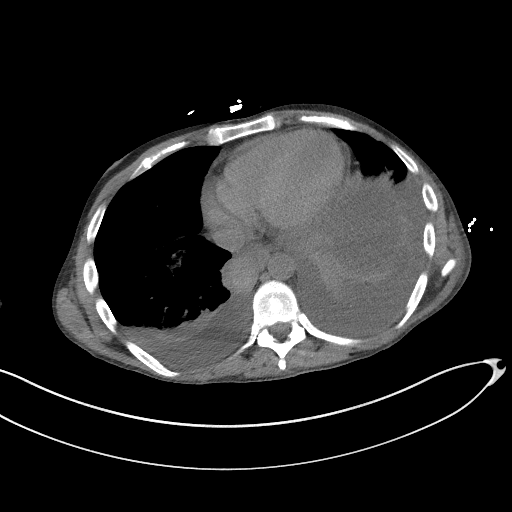

[Series 5: coronal st · coronal · 0.73mm/px · 3 of 81 slices shown]
[im 27/81  soft-tissue]
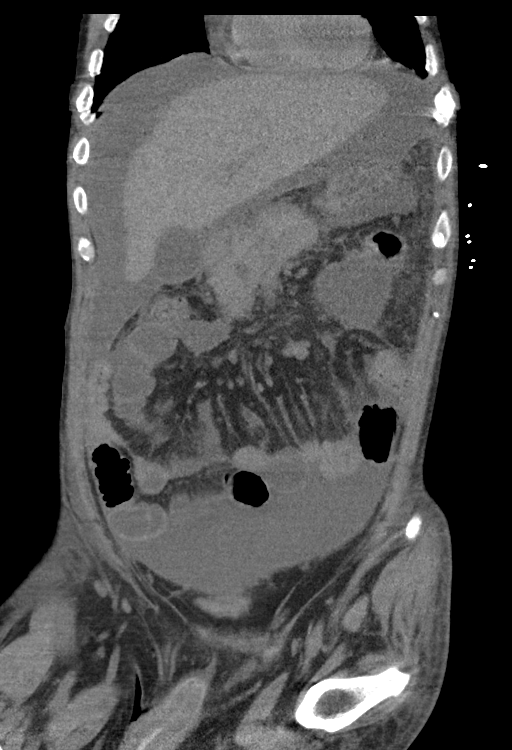
[im 36/81  soft-tissue]
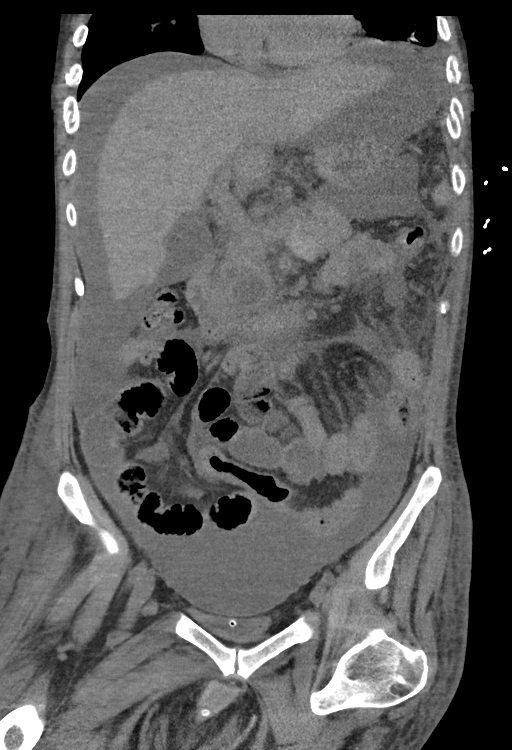
[im 45/81  soft-tissue]
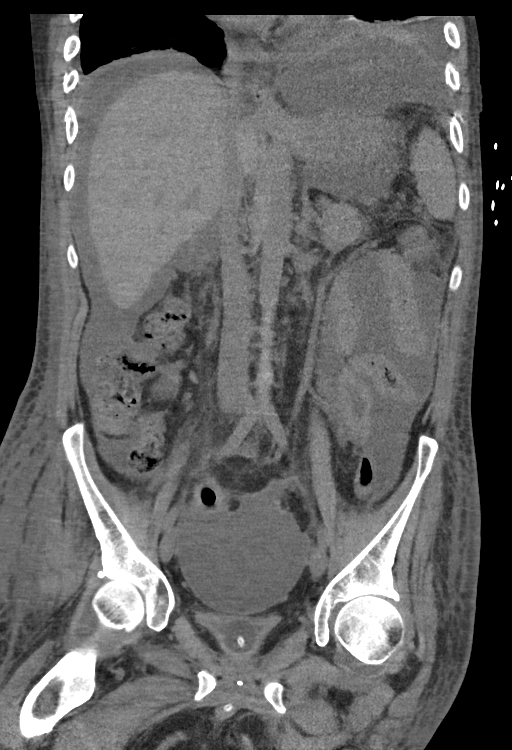

[15 of 46 positions shown; findings below may reference images not displayed]

FINDINGS: Lower chest: Moderate bilateral pleural effusions, left greater than
right, with compressive atelectasis. Findings are stable.

Hepatobiliary: No focal hepatic abnormality. Gallbladder
unremarkable.

Pancreas: Pancreatic head cyst again noted, 2.5 cm. Peripancreatic
edema again noted, similar to prior study. Small pancreatic body
cyst measures 11 mm, stable. These are compatible with pseudocysts
related to pancreatitis.

Spleen: No focal abnormality.  Normal size.

Adrenals/Urinary Tract: No adrenal abnormality. No focal renal
abnormality. No stones or hydronephrosis. Urinary bladder is
unremarkable. Foley catheter present within the bladder.

Stomach/Bowel: Stomach, large and small bowel grossly unremarkable.
Previously seen rectal tube no longer visualized.

Vascular/Lymphatic: No evidence of aneurysm or adenopathy.

Reproductive: No visible focal abnormality.

Other: Moderate ascites in the abdomen and pelvis.  No change.

Musculoskeletal: No acute bony abnormality. Healing left lateral rib
fractures. Diffuse anasarca again noted, stable.
IMPRESSION: Moderate bilateral pleural effusions, left larger than right with
compressive atelectasis. Stable.

Stable ascites throughout the abdomen and pelvis and anasarca
changes.

Peripancreatic edema again noted with pseudo cysts in the pancreatic
body and head/uncinate process. No significant change since prior
study.

Overall, exam is stable since recent study.

## 2020-05-22 IMAGING — DX DG CHEST 1V PORT
1 series · 1 of 1 positions shown · non-contrast
Comparison: [DATE].

CLINICAL DATA: Chest pain.

EXAM:
PORTABLE CHEST 1 VIEW

[chest ap]
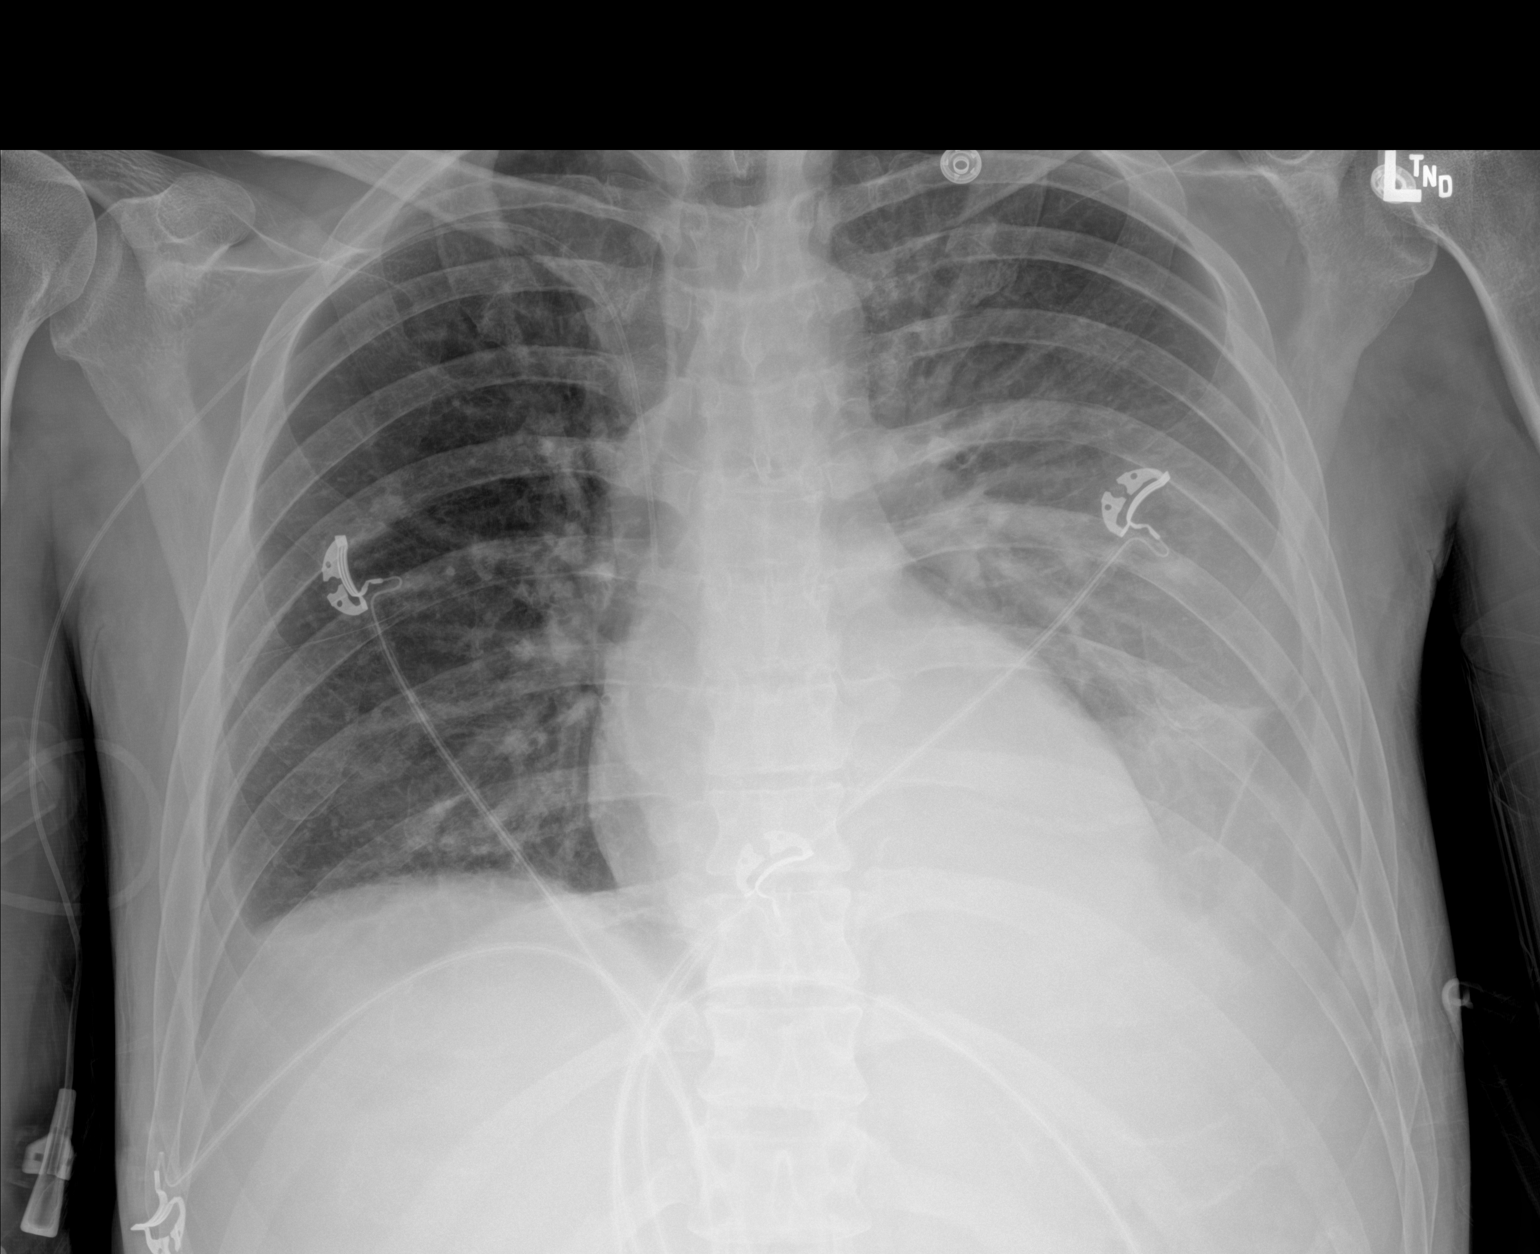

[1 of 1 positions shown; findings below may reference images not displayed]

FINDINGS: The heart size and mediastinal contours are within normal limits.
Right-sided PICC line is noted with distal tip in expected position
of the SVC. No pneumothorax is noted. Mild to moderate left pleural
effusion is noted with increased left basilar atelectasis or
infiltrate. Minimal right pleural effusion is noted with minimal
right basilar subsegmental atelectasis. Mild central pulmonary
vascular congestion is noted. The visualized skeletal structures are
unremarkable.
IMPRESSION: Mild to moderate left pleural effusion is noted with increased left
basilar atelectasis or infiltrate. Minimal right pleural effusion is
noted with minimal right basilar subsegmental atelectasis. Mild
central pulmonary vascular congestion is noted.

## 2020-05-22 MED ORDER — ENOXAPARIN SODIUM 80 MG/0.8ML ~~LOC~~ SOLN
70.0000 mg | Freq: Two times a day (BID) | SUBCUTANEOUS | Status: DC
  Administered 2020-05-22 – 2020-05-25 (×6): 70 mg via SUBCUTANEOUS
  Filled 2020-05-22 (×6): qty 0.8

## 2020-05-22 MED ORDER — SODIUM CHLORIDE 0.9% IV SOLUTION: Freq: Once | INTRAVENOUS | Status: AC

## 2020-05-22 MED ORDER — TRAVASOL 10 % IV SOLN
INTRAVENOUS | Status: AC
  Filled 2020-05-22: qty 1040.4

## 2020-05-22 MED ORDER — VITAMIN D (ERGOCALCIFEROL) 1.25 MG (50000 UNIT) PO CAPS
50000.0000 [IU] | ORAL_CAPSULE | ORAL | Status: DC
  Administered 2020-05-23 – 2020-05-30 (×2): 50000 [IU] via ORAL
  Filled 2020-05-22 (×2): qty 1

## 2020-05-22 MED ORDER — DIPHENHYDRAMINE HCL 50 MG/ML IJ SOLN: 50.0000 mg | Freq: Once | INTRAMUSCULAR | Status: AC

## 2020-05-22 MED ORDER — DIPHENHYDRAMINE HCL 50 MG/ML IJ SOLN
INTRAMUSCULAR | Status: AC
  Administered 2020-05-22: 50 mg via INTRAVENOUS
  Filled 2020-05-22: qty 1

## 2020-05-22 MED ORDER — DRONABINOL 2.5 MG PO CAPS
2.5000 mg | ORAL_CAPSULE | Freq: Two times a day (BID) | ORAL | Status: DC
  Administered 2020-05-22 – 2020-05-30 (×16): 2.5 mg via ORAL
  Filled 2020-05-22 (×17): qty 1

## 2020-05-22 MED ORDER — FUROSEMIDE 10 MG/ML IJ SOLN: 40.0000 mg | Freq: Once | INTRAMUSCULAR | Status: AC

## 2020-05-22 MED ORDER — FUROSEMIDE 10 MG/ML IJ SOLN
INTRAMUSCULAR | Status: AC
  Administered 2020-05-22: 40 mg via INTRAVENOUS
  Filled 2020-05-22: qty 4

## 2020-05-22 NOTE — Consult Note (Signed)
PHARMACY - TOTAL PARENTERAL NUTRITION CONSULT NOTE   Indication: severe malnutrition  Patient Measurements: Height: 5\' 7"  (170.2 cm) Weight: 68.2 kg (150 lb 5.7 oz) IBW/kg (Calculated) : 66.1 TPN AdjBW (KG): 68.3 Body mass index is 23.55 kg/m.  Assessment: 34 year old male with PMHx of EtOH abuse admitted with alcohol withdrawal, acute alcoholic pancreatitis, acute metabolic encephalopathy, ascites s/p paracentesis on 6/28 with 2.8 L fluid removed, chest pain, acute renal failure.   Glucose / Insulin: sSSI q6h  CBG 120 - 134 (required 2 units SSI) Electrolytes: Sodium 132 today from 137 yesterday; suspect hypervolemia. Patient has been ordered IV Lasix. Continue to monitor Trending up: phosphorous, magnesium Trending down: potassium, sodium Renal: AKI resolved. Scr 0.54 today LFTs / TGs: AST/ALT: 43/20 U/L at baseline now trending up to 138/105 today Prealbumin / albumin: albumin 1.4 g/dL (trending down, stable) Intake / Output; MIVF: 4725 in / 2475 out yesterday, no MIVF GI Imaging: none since TPN began Surgeries / Procedures:  7/7: US-guided paracentesis: 3L removed  Central access: 05/16/20 TPN start date: 05/16/20  Nutritional Goals (per RD recommendation on 05/16/20): kCal: 1900 - 2200 / day, Protein: 95 - 105 grams/day, Fluid: 1900 - 2200 mL/day Goal TPN rate is 85 mL/hr (provides 105 g of protein and 2200 kcals per day)  Current Nutrition:  Regular Diet (remains too lethargic to consume with a contunued poor appetite)  Plan:   continue TPN at goal rate of 85 mL/hr at 1800  AA 51 g/L, dextrose 16%, lipids 32 g/L, 2140 mL total volume  Electrolytes in TPN  Cl:Ac 1:1,  Na: 100, K 30, Ca 5, Mg 15, phosphorous 5 (all mEq/L)  No changes today  Add standard MVI, 100 mg thiamine, 1 mg folic acid and trace elements to TPN  continue Sensitive q6h SSI and adjust as needed   Monitor TPN labs daily until stable due to high risk for refeeding syndrome  07/17/20 05/22/2020,8:52 AM

## 2020-05-22 NOTE — Consult Note (Signed)
ANTICOAGULATION CONSULT NOTE  Pharmacy Consult for Heparin infusion  Indication: cerebral venous thrombosis  Patient Measurements: Height: 5\' 7"  (170.2 cm) Weight: 68.2 kg (150 lb 5.7 oz) IBW/kg (Calculated) : 66.1  Vital Signs: Temp: 98.5 F (36.9 C) (07/13 0000) Temp Source: Oral (07/13 0000) BP: 128/78 (07/13 0200) Pulse Rate: 115 (07/13 0200)  Labs: Recent Labs    05/20/20 0439 05/20/20 0439 05/21/20 0434 05/21/20 0434 05/21/20 1218 05/21/20 1454 05/21/20 1935 05/22/20 0155  HGB 8.1*   < > 7.6*  --   --   --   --  6.8*  HCT 24.3*  --  23.0*  --   --   --   --  20.7*  PLT 77*  --  79*  --   --   --   --  85*  HEPARINUNFRC 0.32   < > 0.28*   < > 0.29*  --  0.33 0.37  CREATININE 0.58*  --  0.57*  --   --   --   --   --   CKTOTAL  --   --   --   --   --  30*  --   --    < > = values in this interval not displayed.    Estimated Creatinine Clearance: 121.6 mL/min (A) (by C-G formula based on SCr of 0.57 mg/dL (L)).   Medical History: Past Medical History:  Diagnosis Date  . Alcohol use disorder, severe, dependence (HCC)    Assessment: Pharmacy consulted for heparin infusion dosing and monitoring for 34 yo male for cerebral venous thrombosis.   CT Head: "Confirmed thrombus within the confluence of sinuses and left transverse dural venous sinus. 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 No evidence of hemorrhagic conversion." H&H, platelets continue to trend down slightliy  07/05  0040 HL 0.20 subtherapeutic - rate increased to 1150 units/hr  07/05  1004 HL 0.70 supratherapeutic - rate decreased to 1000 units/hr 07/05 1804 HL < 0.10: inc to 1150 units/hr 07/06  0205 HL 0.32: no change 07/06  0757 HL 0.54: decreased to 900 units/hr 07/06  2200 HL 0.20: increased to 1100 units/hr 07/07  0802 HL 0.23: increased to 1150 units/hr 07/07  1812 HL 0.31: no change 07/08  0020 HL 0.32: no change 07/08  0718 HL 0.29: increased to 1200 units/hr 07/08   1611 HL 0.12 increase to 1350 units/hr   07/09  0032 HL 0.20:increase to 1450 units/hr   07/09  0752 HL 0.30:increase to 1550 units/hr   07/10 0214 HL 0.25: increase to 1750 units/hr 07/10 0955 HL 0.33: no change 07/10 1602 HL 0.31: no change 07/11 0439 HL 0.32, therapeutic, CBC worse 07/12 0434 HL 0.28, slightly SUBtherapeutic, H/H worse, PLTs low but stable 0712 1218 HL 0.29, slightly SUBtherapeutic 0712 1935 HL 0.33, therapeutic x 1 0713 0155 HL 0.37, therapeutic x 2  Goal of Therapy:  Heparin level 0.3-0.5 units/ml Monitor platelets by anticoagulation protocol: Yes   Plan:  7/13 0155 HL 0.37 therapeutic. Continue heparin drip at 1850 units/hr and recheck HL with AM labs. CBC daily.  8/13, PharmD, BCPS 05/22/2020 2:39 AM
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
## 2020-05-22 NOTE — Progress Notes (Signed)
PROGRESS NOTE    Shane Depaula Sr.  ZMO:[id number] DOB: [birth year] DOA: 05/03/2020 PCP: Lorelee Market, MD   Brief Narrative:  34 year old gentleman with no medical history, chronic alcoholism, has been drinking nonstop about 12 packs of beer daily since age of 83 presented to the ED on  05/03/2020 for evaluation of progressive confusion and lethargy. He also had episode of unresponsiveness in the triage area. As per patient's fianc, patient stopped drinking about 24 hours prior to arrival and started behaving altered, confused. Also reported multiple episodes diarrhea, but no nausea or vomiting.  In ED he had 1 witnessed seizure. CT abdomen with concern of pancreatitis with elevated lipase, bilateral pleural effusions and ascites.  He was initially admitted for management of acute alcohol withdrawal and alcoholic pancreatitis.  Patient has a very complicated course of illness, underwent alcohol withdrawal.  There was some concern of SBP.  He was evaluated by surgeons on 05/06/2020 for concern of acute compartment syndrome and abdomen and found to have some intra-abdominal hypertension which was managed conservatively by placing Foley catheter and NG tube.  Sentences with cell count of 822, neutrophils of 79%.  Lipase was elevated at 53,000, LDH 526 and amylase of 10,000.  Cultures were negative.  Consistent with pancreatic ascites.   Lipase continued to get worse initially with peaked at 1543, now trending down.  MRCP and repeat CT abdomens with some attenuations in pancreatic head and body, some improvement in edema.      Neurology was consulted on 05/10/2020 due to worsening altered mental status. 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  Patient did not receive any coronavirus [employer] vaccine.  He was initiated on heparin.  EEG showed generalized background slowing.  No epileptiform activity was noted. A  repeat MRI brain without contrast done on 05/15/2020 showed Residual abnormal diffusion restriction over the right posterior parietal lobe and right cerebellum, consistent with expected evolution of subacute venous infarcts.  It was started on Heparin by neurology.  On 05/16/2019 1 in the evening he became hypotensive and hypothermic with worsening leukocytosis and development of lactic acidosis.  He was transferred to ICU on 05/16/2020 due to the concern for developing septic shock.  He was started on pressors.  His lactate was 2 1 pro-Cal was 1.84 WBC was 40.4 and hemoglobin was 11.9.  CT abdomen done on 05/16/2020 showed peripancreatic edema with multiple pseudocyst.  The dominant pseudocyst has decreased to 2.2 cm.  There was extensive ascites with loculation around the liver right gutter and pelvis. Patient has been started on meropenem.  Although urine, blood and ascitic fluid cultures remain negative.  C. difficile colitis and GI pathogens were negative.  Plan is to continue meropenem for 7 days for concern of infected pseudocyst.  Patient did developed decreased in hemoglobin and there was some concern of GI bleed.  Patient and his wife did endorse some blood in his stool before coming to the hospital.  Patient has a strong family history of colon cancer where both of his parents died of colon cancer, mother in 38s and father in 24s.  GI is recommending outpatient work-up after this acute illness.   TPN was started on 05/17/2020 due to poor p.o. intake.  Subjective: Patient developed bilateral lower rib cage pain along with some shortness of breath while getting blood transfusion earlier today.  He was able to take some milk and juices yesterday.  No  p.o. intake since this morning.  Assessment & Plan:   Principal Problem:   Acute alcoholic pancreatitis Active Problems:   Alcohol withdrawal seizure with delirium (HCC)   Hyponatremia   Hypokalemia   AKI (acute kidney injury) ([place])   Elevated CK    Heme positive stool   Thrombocytopenia (HCC)   Acute metabolic encephalopathy   Severe malnutrition (HCC)   Sinus tachycardia   Ascites   Hypotension   Protein-calorie malnutrition, severe  Sepsis with septic shock.  Resolved  Transferred to ICU on 05/15/2020 due to hypothermia and hypotension requiring Levophed.  Now off the Levophed and maintaining his blood pressure.  ID was also consulted and they started him on meropenem for concern of infected pseudocyst. Remained afebrile over the last 24-hour, before that having some low-grade fever.  Leukocytosis with some worsening today. -Continue meropenem for total of 7 days.  Day 5 today.  Blood transfusion reaction.  Patient developed some rash along with complaint of shortness of breath and lower bilateral rib cage pain.  Stat chest x-ray with some pulmonary vascular congestion.  Patient was given IV Benadryl and blood transfusion was stopped. -Give him one-time dose of Lasix 40 mg.  Patient is getting quite a bit of fluid with TPN. -Continue to monitor.  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   CT venogram on 7/3 and a repeat on 7/5 with persistent thrombosis at the confluence of sinuses and left transverse dural venous sinus.  Neurology is following and they start him on heparin infusion.  Patient also had acute right cerebellum and a possible old infarcts involving parietal lobes and left occipital region, seen on MRI done on 05/12/2020.  No hypercoagulable work-up. Due to his worsening lower extremity rash which is also involving some upper extremities there is some concern of vasculitis.  Patient also has some rash/thrush in his mouth. His differential is broad at this time which includes rheumatologic disorder/vasculitis/Behcet's syndrome/malignancy. Elevated ESR and CRP.  CK has been normalized. Dr. Jefm Bryant from rheumatology saw the patient yesterday-rheumatologic  work-up ordered-pending results.  He does not want to start him on a steroid before getting a skin biopsy. Requested Dr. Nehemiah Massed from dermatology for a skin biopsy-he will try doing biopsy either later today or tomorrow morning. ID also ordered RPR which is negative, hepatitis panel-pending results. Discussed with GI to rule out any malignancy.  He has a strong family history of colon cancer as both of his parents died of colon cancer at an early age. They are advising outpatient work-up after recovering from this acute illness. -Follow-up hypercoagulable and rheumatologic work-up-labs are drawn, pending results. -Labs were drawn for some nutritional deficiencies which include zinc, copper, vitamins-pending results.  Erythematous skin rash.  There is some concern of vasculitis.  Less likely hood of cellulitis as it is present on all extremities.  Patient developed a small morbilliform rash on his belly today, rash was noted during blood transfusion.  Less likely to be secondary to blood transfusion reaction. -Sent a message to Dr. Nehemiah Massed from [facility] for a skin biopsy.  Thrombocytopenia.  Platelets at 85.  Platelet started improving so less likely HIT but labs were drawn yesterday-pending results. -Continue with heparin and closely monitor platelets.  Anemia.  Hemoglobin trending down, it was 6.8 today.  1 unit of PRBC was ordered by overnight provider, patient developed some shortness of breath towards the end of transfusion which was stopped.  Received 2 unit of PRBC on 05/18/2020.  Per wife he did had some bleeding per rectum at home.  Not aware of any obvious bleeding while in the hospital.  Patient is critically ill also.  Had some mild hematuria on UA done on 05/16/2020.  No gross hematuria. -Check anemia panel-iron studies consistent with anemia of chronic disease.  Low folic acid, normal Y77. -Start him on folic acid supplement. -Continue to monitor and transfuse below  7. -Check FOBT-still pending.  Chronic pancreatitis with pseudocyst formation.  Present on admission.  CT abdomen without any gallbladder abnormalities but did had multiple cyst in pancreatic head and body.  Lipase peaked at 1543, now trending down. -CT abdomen was repeated today due to concern of retroperitoneal bleeding.  It shows stable pancreatitis changes with pseudocyst.  Stable ascites and pleural effusion. -Continue supportive care.  Severe protein calorie malnutrition.  POA, most likely secondary to chronic alcohol abuse.  Patient does not want enteric feeding with NG tube.  Currently on TPN since 05/17/2020. BMI of 23.58 . -Continue monitoring daily CMP, magnesium, phosphorous. -Encourage p.o. intake. -We will try weaning him off from TPN once started taking p.o.  Transaminitis.  Mild worsening of AST and ALT, mild improvement in alkaline phosphatase.  He is on TPN and also has an history of alcohol abuse. -Continue to monitor.  TSH elevation.  T4 normal.  No acute concern at this time.  Diarrhea.  On and off since admission.  C. difficile colitis and GI pathogen panel negative.  Most likely secondary to pancreatitis.  Patient is also on TPN.  Sinus tachycardia.  No obvious cause.  Tachycardia persist off the pressors.  Denies any pain.  Past alcohol withdrawal.  Cultures negative. Echo normal.  He was started on low-dose metoprolol yesterday, heart rate still little above 100. -Increase the dose of metoprolol to 25 mg twice daily.  Ascites.  Paracentesis labs are consistent with pancreatic ascites.  CT abdomen with mild fatty infiltrate, no cirrhosis.  Patient underwent paracentesis twice 1 on 05/07/2020 and second on 05/16/2020.  Culture remain negative.  Repeat CT abdomen today with stable ascites.  Pressure injuries.  High risk due to severe protein calorie malnutrition and inability to move.  Getting some unstageable breakdown on his sacrum and bilateral heels. -Continue to  monitor. -Frequent position changes.  AKI.  Resolved Patient had AKI on admission.  Most likely prerenal as creatinine normalized with IV hydration. -Continue to monitor.  Electrolyte abnormalities.  Patient had hypervolemic hyponatremia which was present on admission and has been resolved.  He also developed some hypokalemia which has been resolved at this time.  He is high risk for electrolyte abnormalities as he is on TPN.  Also concern of refeeding syndrome. -Continue to monitor BMP, magnesium and phosphate.  Depression.  Patient appears depressed.  Per wife he is feeling very down since the death of his father and drinking a lot in order to overcome his depression. -Psychiatry consult-they will see him today.  Palliative care consult.  Palliative care was consulted to discuss goals of care.  Patient and his wife will continue aggressive measures and to treat treatables.  Code was changed to DNR.  Patient is critically ill with multiorgan involvement and is very high risk for deterioration and death.   Objective: Vitals:   June 08, 2020 0850 Jun 08, 2020 0855 06-08-20 0900 06/08/20 0905  BP:  126/85 129/86 (!) 132/92  Pulse:  (!) 116 (!) 112 (!) 112  Resp:  (!) 24 (!) 24 (!) 25  Temp: 98.2 F (36.8  C) 98.1 F (36.7 C) 97.8 F (36.6 C) 97.9 F (36.6 C)  TempSrc:   Oral Oral  SpO2:  99% 99% 99%  Weight:      Height:        Intake/Output Summary (Last 24 hours) at 05/22/2020 1236 Last data filed at 05/22/2020 0935 Gross per 24 hour  Intake 5040.93 ml  Output 6250 ml  Net -1209.07 ml   Filed Weights   05/03/20 2232 05/16/20 0030 05/22/20 0159  Weight: 63.5 kg 68.3 kg 68.2 kg    Examination:  General exam: Chronically ill-appearing gentleman, dry mucous membranes, in no acute distress. Respiratory system: Few left basal crackles, respiratory effort normal. Cardiovascular system: S1 & S2 heard, RRR. Gastrointestinal system: Soft, epigastric tenderness, mildly distended, bowel  sounds positive. Central nervous system: Alert and oriented. No focal neurological deficits. Extremities: 2+ LE edema, no cyanosis, pulses intact and symmetrical. Skin: Erythematous rash involving bilateral lower extremities and upper extremities.  With new morbilliform rash on abdomen.  Lower extremity rash seems improving. Psychiatry: Judgement and insight appear normal.   DVT prophylaxis: Heparin Code Status: DNR Family Communication: Discussed with wife Disposition Plan:  Status is: Inpatient  Remains inpatient appropriate because:Inpatient level of care appropriate due to severity of illness   Dispo: The patient is from: Home              Anticipated d/c is to: To be determined              Anticipated d/c date is: > 3 days              Patient currently is not medically stable to d/c.  Consultants:   Neurology  Gastroenterology  General surgery  Palliative care  PCCM  Rheumatology  Dermatology  Procedures:  Antimicrobials:  Meropenem  Data Reviewed: I have personally reviewed following labs and imaging studies  CBC: Recent Labs  Lab 05/15/20 2006 05/16/20 0331 05/18/20 1830 05/19/20 0214 05/20/20 0439 05/21/20 0434 05/22/20 0155  WBC 34.2*   < > 19.2* 16.9* 15.9* 15.7* 19.0*  NEUTROABS 31.7*  --   --   --   --  11.9*  --   HGB 9.1*   < > 9.8* 9.7* 8.1* 7.6* 6.8*  HCT 26.4*   < > 28.6* 28.0* 24.3* 23.0* 20.7*  MCV 95.0   < > 90.2 90.3 91.7 92.4 94.1  PLT 126*   < > 113* 94* 77* 79* 85*   < > = values in this interval not displayed.   Basic Metabolic Panel: Recent Labs  Lab 05/18/20 0517 05/19/20 0214 05/20/20 0439 05/21/20 0434 05/22/20 0155  NA 136 133*  132* 137 137 132*  K 3.6 4.6  4.5 4.9 5.0 4.7  CL 105 101  101 103 103 98  CO2 '26 25  25 28 28 27  ' GLUCOSE 135* 145*  145* 149* 133* 131*  BUN '15 13  13 14 18 18  ' CREATININE 0.81 0.69  0.72 0.58* 0.57* 0.54*  CALCIUM 6.8* 7.7*  7.6* 8.1* 8.3* 8.1*  MG 2.3 1.9 1.7 1.7 1.8  PHOS  4.0 3.4 3.9 4.7* 4.9*   GFR: Estimated Creatinine Clearance: 121.6 mL/min (A) (by C-G formula based on SCr of 0.54 mg/dL (L)). Liver Function Tests: Recent Labs  Lab 05/16/20 0331 05/16/20 0331 05/17/20 0535 05/17/20 0535 05/18/20 0517 05/19/20 0214 05/20/20 0439 05/21/20 0434 05/22/20 0155  AST 36  --  43*  --  36  --   --  115*  138*  ALT 21  --  20  --  18  --   --  61* 105*  ALKPHOS 71  --  99  --  86  --   --  188* 169*  BILITOT 0.8  --  0.6  --  0.4  --   --  0.5 0.6  PROT 4.9*  --  4.3*  --  4.1*  --   --  4.6* 4.7*  ALBUMIN 2.8*   < > 1.8*   < > 1.5* 1.6* 1.4* 1.4* 1.4*   < > = values in this interval not displayed.   Recent Labs  Lab 05/16/20 0331 05/17/20 0535 05/18/20 1830 05/21/20 1454  LIPASE 912* 337* 171* 241*   Recent Labs  Lab 05/15/20 2006  AMMONIA 17   Coagulation Profile: No results for input(s): INR, PROTIME in the last 168 hours. Cardiac Enzymes: Recent Labs  Lab 05/21/20 1454 05/22/20 0155  CKTOTAL 30* 23*   BNP (last 3 results) No results for input(s): PROBNP in the last 8760 hours. HbA1C: No results for input(s): HGBA1C in the last 72 hours. CBG: Recent Labs  Lab 05/20/20 2348 05/21/20 0558 05/21/20 2327 05/22/20 0501 05/22/20 1140  GLUCAP 137* 120* 110* 134* 88   Lipid Profile: Recent Labs    05/21/20 0434  TRIG 66   Thyroid Function Tests: No results for input(s): TSH, T4TOTAL, FREET4, T3FREE, THYROIDAB in the last 72 hours. Anemia Panel: Recent Labs    05/21/20 0434 05/21/20 1219  VITAMINB12  --  830  FOLATE 5.6*  --   FERRITIN 971*  --   TIBC 116*  --   IRON 17*  --   RETICCTPCT  --  2.3   Sepsis Labs: Recent Labs  Lab 05/15/20 2223 05/16/20 0327 05/16/20 0327 05/17/20 0535 05/18/20 0517 05/21/20 0434 05/22/20 0155  PROCALCITON  --  1.84   < > 1.37 0.84 1.03 1.24  LATICACIDVEN 2.0* 1.1  --   --   --   --   --    < > = values in this interval not displayed.    Recent Results (from the past 240  hour(s))  CULTURE, BLOOD (ROUTINE X 2) w Reflex to ID Panel     Status: None   Collection Time: 05/15/20 11:52 PM   Specimen: Left Antecubital; Blood  Result Value Ref Range Status   Specimen Description LEFT ANTECUBITAL  Final   Special Requests   Final    BOTTLES DRAWN AEROBIC AND ANAEROBIC Blood Culture adequate volume   Culture   Final    NO GROWTH 5 DAYS Performed at [facility], [address] [place], [place] [address]    Report Status 05/21/2020 FINAL  Final  CULTURE, BLOOD (ROUTINE X 2) w Reflex to ID Panel     Status: None   Collection Time: 05/15/20 11:53 PM   Specimen: BLOOD LEFT HAND  Result Value Ref Range Status   Specimen Description BLOOD LEFT HAND  Final   Special Requests   Final    BOTTLES DRAWN AEROBIC AND ANAEROBIC Blood Culture adequate volume   Culture   Final    NO GROWTH 5 DAYS Performed at [facility], [address], [place], [place] [address]    Report Status 05/21/2020 FINAL  Final  MRSA PCR Screening     Status: None   Collection Time: 05/16/20 12:24 AM   Specimen: Nasopharyngeal  Result Value Ref Range Status   MRSA by PCR NEGATIVE NEGATIVE  Final    Comment:        The GeneXpert MRSA Assay (FDA approved for NASAL specimens only), is one component of a comprehensive MRSA colonization surveillance program. It is not intended to diagnose MRSA infection nor to guide or monitor treatment for MRSA infections. Performed at [facility], [address] [place], [place] [address]   Urine Culture     Status: None   Collection Time: 05/16/20  3:27 AM   Specimen: Urine, Random  Result Value Ref Range Status   Specimen Description   Final    URINE, RANDOM Performed at [facility], [address] [place], [place] [address]    Special Requests   Final    NONE Performed at [facility], [address] [place], [place] [address]    Culture   Final    NO GROWTH Performed at [facility], [place] [address], [place], [place] [address]    Report Status 05/17/2020 FINAL  Final  Gastrointestinal Panel by PCR , Stool     Status: None   Collection Time: 05/17/20  3:06 PM   Specimen: Stool  Result Value Ref Range Status   Campylobacter species NOT DETECTED NOT DETECTED Final   Plesimonas shigelloides NOT DETECTED NOT DETECTED Final   Salmonella species NOT DETECTED NOT DETECTED Final   Yersinia enterocolitica NOT DETECTED NOT DETECTED Final   Vibrio species NOT DETECTED NOT DETECTED Final   Vibrio cholerae NOT DETECTED NOT DETECTED Final   Enteroaggregative E coli (EAEC) NOT DETECTED NOT DETECTED Final   Enteropathogenic E coli (EPEC) NOT DETECTED NOT DETECTED Final   Enterotoxigenic E coli (ETEC) NOT DETECTED NOT DETECTED Final   Shiga like toxin producing E coli (STEC) NOT DETECTED NOT DETECTED Final   Shigella/Enteroinvasive E coli (EIEC) NOT DETECTED NOT DETECTED Final   Cryptosporidium NOT DETECTED NOT DETECTED Final   Cyclospora cayetanensis NOT DETECTED NOT DETECTED Final   Entamoeba histolytica NOT DETECTED NOT DETECTED Final   Giardia lamblia NOT DETECTED NOT DETECTED Final   Adenovirus F40/41 NOT DETECTED NOT DETECTED Final   Astrovirus NOT DETECTED NOT DETECTED Final   Norovirus GI/GII NOT DETECTED NOT DETECTED Final   Rotavirus A NOT DETECTED NOT DETECTED Final   Sapovirus (I, II, IV, and V) NOT DETECTED NOT DETECTED Final    Comment: Performed at [facility], [address] [place], [place] [address]  C Difficile Quick Screen (NO PCR Reflex)     Status: None   Collection Time: 05/17/20  3:06 PM   Specimen: STOOL  Result Value Ref Range Status   C Diff antigen NEGATIVE NEGATIVE Final   C Diff toxin NEGATIVE NEGATIVE Final   C Diff interpretation No C. difficile detected.  Final    Comment: Performed at [facility], [address] [place], [place] [address]  Aerobic Culture (superficial specimen)     Status: None (Preliminary  result)   Collection Time: 05/21/20  2:30 PM   Specimen: Wound  Result Value Ref Range Status   Specimen Description   Final    WOUND Performed at [facility], [address] [place], [place] [address]    Special Requests   Final    NONE Performed at [facility], [place], [place] [address]    Gram Stain NO WBC SEEN NO ORGANISMS SEEN   Final   Culture   Final    NO GROWTH < 24 HOURS Performed at [facility], [place] [address] [place], [place] [address]    Report Status PENDING  Incomplete     Radiology Studies: CT ABDOMEN PELVIS WO CONTRAST  Result Date: 05/22/2020 CLINICAL DATA:  Anemia EXAM: CT ABDOMEN AND PELVIS WITHOUT CONTRAST TECHNIQUE: Multidetector CT imaging of the abdomen and pelvis was performed following the standard protocol without IV contrast. COMPARISON:  05/16/2020 FINDINGS: Lower chest: Moderate bilateral pleural effusions, left greater than right, with compressive atelectasis. Findings are stable. Hepatobiliary: No focal hepatic abnormality. Gallbladder unremarkable. Pancreas: Pancreatic head cyst again noted, 2.5 cm. Peripancreatic edema again noted, similar to prior study. Small pancreatic body cyst measures 11 mm, stable. 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 Spleen: No focal abnormality.  Normal size. Adrenals/Urinary Tract: No adrenal abnormality. No focal renal abnormality. No stones or hydronephrosis. Urinary bladder is unremarkable. Foley catheter present within the bladder. Stomach/Bowel: Stomach, large and small bowel grossly unremarkable. Previously seen rectal tube no longer visualized. Vascular/Lymphatic: No evidence of aneurysm or adenopathy. Reproductive: No visible focal abnormality. Other: Moderate ascites in the abdomen and pelvis.  No change. Musculoskeletal: No acute bony abnormality. Healing left lateral rib fractures. Diffuse anasarca again noted, stable. IMPRESSION: Moderate  bilateral pleural effusions, left larger than right with compressive atelectasis. Stable. Stable ascites throughout the abdomen and pelvis and anasarca changes. Peripancreatic edema again noted with pseudo cysts in the pancreatic body and head/uncinate process. No significant change since prior study. Overall, exam is stable since recent study. Electronically Signed   By: Rolm Baptise M.D.   On: 05/22/2020 12:03   DG Chest Port 1 View  Result Date: 05/22/2020 CLINICAL DATA:  Chest pain. EXAM: PORTABLE CHEST 1 VIEW COMPARISON:  May 15, 2020. FINDINGS: The heart size and mediastinal contours are within normal limits. Right-sided PICC line is noted with distal tip in expected position of the SVC. No pneumothorax is noted. Mild to moderate left pleural effusion is noted with increased left basilar atelectasis or infiltrate. Minimal right pleural effusion is noted with minimal right basilar subsegmental atelectasis. Mild central pulmonary vascular congestion is noted. The visualized skeletal structures are unremarkable. IMPRESSION: Mild to moderate left pleural effusion is noted with increased left basilar atelectasis or infiltrate. Minimal right pleural effusion is noted with minimal right basilar subsegmental atelectasis. Mild central pulmonary vascular congestion is noted. Electronically Signed   By: Marijo Conception M.D.   On: 05/22/2020 08:57    Scheduled Meds: . Chlorhexidine Gluconate Cloth  6 each Topical Daily  . folic acid  1 mg Intravenous Daily  . Gerhardt's butt cream   Topical TID  . insulin aspart  0-9 Units Subcutaneous Q6H  . metoprolol tartrate  12.5 mg Oral BID  . pantoprazole (PROTONIX) IV  40 mg Intravenous Q12H  . sodium chloride flush  3 mL Intravenous Q12H   Continuous Infusions: . sodium chloride Stopped (05/13/20 1628)  . heparin 1,850 Units/hr (05/22/20 0600)  . meropenem (MERREM) IV 1 g (05/22/20 0916)  . TPN ADULT (ION) 85 mL/hr at 05/22/20 0600  . TPN ADULT (ION)        LOS: 19 days   Time spent: 50 minutes.  Lorella Nimrod, MD [facility]  If 7PM-7AM, please contact night-coverage [email or web address]  05/22/2020, 12:36 PM   This record has been created using Systems analyst. Errors have been sought and corrected,but may not always be located. Such creation errors do not reflect on the standard of care.

## 2020-05-22 NOTE — Consult Note (Signed)
ANTICOAGULATION CONSULT NOTE  Pharmacy Consult for Enoxaparin  Indication: cerebral venous thrombosis  Patient Measurements: Height: 5\' 7"  (170.2 cm) Weight: 68.2 kg (150 lb 5.7 oz) IBW/kg (Calculated) : 66.1  Vital Signs: Temp: 98.4 F (36.9 C) (07/13 1200) Temp Source: Oral (07/13 1200) BP: 132/92 (07/13 0905) Pulse Rate: 112 (07/13 0905)  Labs: Recent Labs    05/20/20 0439 05/20/20 0439 05/21/20 0434 05/21/20 0434 05/21/20 1218 05/21/20 1454 05/21/20 1935 05/22/20 0155  HGB 8.1*   < > 7.6*  --   --   --   --  6.8*  HCT 24.3*  --  23.0*  --   --   --   --  20.7*  PLT 77*  --  79*  --   --   --   --  85*  HEPARINUNFRC 0.32   < > 0.28*   < > 0.29*  --  0.33 0.37  CREATININE 0.58*  --  0.57*  --   --   --   --  0.54*  CKTOTAL  --   --   --   --   --  30*  --  23*   < > = values in this interval not displayed.    Estimated Creatinine Clearance: 121.6 mL/min (A) (by C-G formula based on SCr of 0.54 mg/dL (L)).   Medical History: Past Medical History:  Diagnosis Date  . Alcohol use disorder, severe, dependence (HCC)    Assessment: Pharmacy consulted for heparin infusion dosing and monitoring for 34 yo male for cerebral venous thrombosis. Now planning to transition patient from heparin infusion to therapeutic enoxaparin. H&H and platelets down-trending since admission, however, no obvious signs of bleeding. Possible component of hemodilution.  There is concern for HIT and HIT antibody is in process. Platelets as high as 246 and as low as 77 this admission but stable to 85 today. Difficult to ascertain as mild thrombocytopenia was present on admission at 110 on 6/24 before trending up and then down again. Continue to monitor.  Weight per RN today: 69.2 kg (increased from 63 kg documented on 6/24).   Goal of Therapy:  Monitor platelets by anticoagulation protocol   Plan:  --Discontinue heparin drip at 1700 --Start enoxaparin 70 mg (1 mg/kg) q12h hours at 1800; to  begin 1 hour after discontinuation of heparin infusion --CBC per protocol  7/24 05/22/2020 1:33 PM

## 2020-05-22 NOTE — Plan of Care (Signed)
  Problem: Clinical Measurements: Goal: Ability to maintain clinical measurements within normal limits will improve Outcome: Progressing Goal: Will remain free from infection Outcome: Progressing Goal: Respiratory complications will improve Outcome: Progressing Goal: Cardiovascular complication will be avoided Outcome: Progressing   Problem: Activity: Goal: Risk for activity intolerance will decrease Outcome: Progressing   Problem: Nutrition: Goal: Adequate nutrition will be maintained Outcome: Progressing   Problem: Coping: Goal: Level of anxiety will decrease Outcome: Progressing   Problem: Elimination: Goal: Will not experience complications related to bowel motility Outcome: Progressing   Problem: Safety: Goal: Ability to remain free from injury will improve Outcome: Progressing   Problem: Skin Integrity: Goal: Risk for impaired skin integrity will decrease Outcome: Progressing

## 2020-05-22 NOTE — Progress Notes (Signed)
0855-This RN was rounding on patient during blood transfusion and noted patient was noticeably short of breath, as well as developing a red rash on his trunk.  All vital signs WNL aside from breathing (RR 30).  This RN immediately stopped the blood transfusion ( had infused)  and contacted the MD and Charge Nurse.  This RN disconnected infusion and placed all products in Red Biohazard bag for Blood Bank to collect products.  This RN flushed PICC with NS bolus.  MD ordered Stat Chest Xray, 40mg  of Lasix x1 IV push now, 50mg  IV Benadryl x1 IV push now for symptoms.    PT had immediate output of of urine in foley catheter.   All vital signs within normal limits since transfusion, and MD ordered stat abdominal CT to r/o bleeding.  This RN administered pain medication as ordered and will continue to monitor.  MD at bedside, no further orders given

## 2020-05-22 NOTE — Progress Notes (Signed)
CH visited pt. as follow up from prior visits and per [facility] referral from RN; pt. sitting up in bed, eating hot dog from cafeteria; S.O. [place] at bedside.  Pt. only ate a bite or so of food before asking for it to be taken away; he says he has been experiencing pain in legs and nausea today.  S.O. shared that is has been somewhat confusing to hear different reports from the multiple MDs assigned to pt.'s case; pt. and S.O. do not seem to have a clear sense of expected prognosis; S.O. says medical team is still doing tests to figure out source of fluid and leg swelling.  No specific needs expressed at this time; pt. at one point voiced concern for a safe living situation for his son should Pt. leave him behind.  CH remains available as needed.    05/22/20 1330  Clinical Encounter Type  Visited With Patient and family together  Visit Type Follow-up;Psychological support;Social support;Critical Care  Referral From Chaplain;Nurse  Spiritual Encounters  Spiritual Needs Emotional  Stress Factors  Patient Stress Factors Loss of control;Major life changes;Health changes  Family Stress Factors Loss of control;Major life changes;Health changes;Lack of knowledge

## 2020-05-22 NOTE — Progress Notes (Signed)
Subjective: Patient is awake and appears depressed, poor appetite.   Objective: Current vital signs: BP (!) 132/92 (BP Location: Left Arm)   Pulse (!) 112   Temp 97.9 F (36.6 C) (Oral)   Resp (!) 25   Ht  (1.702 m)   Wt 68.2 kg   SpO2 99%   BMI 23.55 kg/m  Vital signs in last 24 hours: Temp:  [97.5 F (36.4 C)-99.1 F (37.3 C)] 97.9 F (36.6 C) (07/13 0905) Pulse Rate:  [98-120] 112 (07/13 0905) Resp:  [16-25] 25 (07/13 0905) BP: (118-137)/(73-92) 132/92 (07/13 0905) SpO2:  [93 %-100 %] 99 % (07/13 0905) Weight:  [68.2 kg] 68.2 kg (07/13 0159)  Intake/Output from previous day: 07/12 0701 - 07/13 0700 In: 4725.9 [I.V.:4092.6; Blood:25; IV Piggyback:608.4] Out: 2250 [Urine:2250] Intake/Output this shift: Total I/O In: 315 [Blood:315] Out: 4000 [Urine:4000] Nutritional status:  Diet Order            Diet regular Room service appropriate? Yes; Fluid consistency: Thin  Diet effective now                 Neurologic Exam: Mental Status: Lethargic but arousable. Dysarthric. Able to follow simple commands with delay.  At times able to have full conversation.   Cranial Nerves: ZO:XWRUEA equal and both reactive to light.   III,IV, VI: extra-ocular motions intact bilaterally V,VII:face symmetric Motor: Moves extremities weakly against gravity Sensory:Responds to noxious stimuli in all extremities  Lab Results: Basic Metabolic Panel: Recent Labs  Lab 05/18/20 0517 05/18/20 0517 05/19/20 0214 05/19/20 0214 05/20/20 0439 05/21/20 0434 05/22/20 0155  NA 136  --  133*  132*  --  137 137 132*  K 3.6  --  4.6  4.5  --  4.9 5.0 4.7  CL 105  --  101  101  --  103 103 98  CO2 26  --  25  25  --  GLUCOSE 135*  --  145*  145*  --  149* 133* 131*  BUN 15  --  13  13  --  CREATININE 0.81  --  0.69  0.72  --  0.58* 0.57* 0.54*  CALCIUM 6.8*   < > 7.7*  7.6*   < > 8.1* 8.3* 8.1*  MG 2.3  --  1.9  --  1.7 1.7 1.8  PHOS 4.0  --  3.4   --  3.9 4.7* 4.9*   < > = values in this interval not displayed.    Liver Function Tests: Recent Labs  Lab 05/16/20 0331 05/16/20 0331 05/17/20 0535 05/17/20 0535 05/18/20 0517 05/19/20 0214 05/20/20 0439 05/21/20 0434 05/22/20 0155  AST 36  --  43*  --  36  --   --  115* 138*  ALT 21  --  20  --  18  --   --  61* 105*  ALKPHOS 71  --  99  --  86  --   --  188* 169*  BILITOT 0.8  --  0.6  --  0.4  --   --  0.5 0.6  PROT 4.9*  --  4.3*  --  4.1*  --   --  4.6* 4.7*  ALBUMIN 2.8*   < > 1.8*   < > 1.5* 1.6* 1.4* 1.4* 1.4*   < > = values in this interval not displayed.   Recent Labs  Lab 05/16/20 0331 05/17/20 0535 05/18/20 1830 05/21/20 1454  LIPASE 912* 337* 171* 241*   Recent Labs  Lab 05/15/20 2006  AMMONIA 17    CBC: Recent Labs  Lab 05/15/20 2006 05/16/20 0331 05/18/20 1830 05/19/20 0214 05/20/20 0439 05/21/20 0434 05/22/20 0155  WBC 34.2*   < > 19.2* 16.9* 15.9* 15.7* 19.0*  NEUTROABS 31.7*  --   --   --   --  11.9*  --   HGB 9.1*   < > 9.8* 9.7* 8.1* 7.6* 6.8*  HCT 26.4*   < > 28.6* 28.0* 24.3* 23.0* 20.7*  MCV 95.0   < > 90.2 90.3 91.7 92.4 94.1  PLT 126*   < > 113* 94* 77* 79* 85*   < > = values in this interval not displayed.    Cardiac Enzymes: Recent Labs  Lab 05/21/20 1454 05/22/20 0155  CKTOTAL 30* 23*    Lipid Panel: Recent Labs  Lab 05/16/20 1230 05/21/20 0434  TRIG 45 66    CBG: Recent Labs  Lab 05/20/20 2348 05/21/20 0558 05/21/20 2327 05/22/20 0501 05/22/20 1140  GLUCAP 137* 120* 110* 134* 88    Microbiology: Results for orders placed or performed during the hospital encounter of 05/03/20  SARS Coronavirus 2 by RT PCR (hospital order, performed in [facility] hospital lab) Nasopharyngeal Nasopharyngeal Swab     Status: None   Collection Time: 05/03/20  8:25 PM   Specimen: Nasopharyngeal Swab  Result Value Ref Range Status   SARS Coronavirus 2 NEGATIVE NEGATIVE Final    Comment: (NOTE) SARS-CoV-2 target  nucleic acids are NOT DETECTED.  The SARS-CoV-2 RNA is generally detectable in upper and lower respiratory specimens during the acute phase of infection. The lowest concentration of SARS-CoV-2 viral copies this assay can detect is 250 copies / mL. A negative result does not preclude SARS-CoV-2 infection and should not be used as the sole basis for treatment or other patient management decisions.  A negative result may occur with improper specimen collection / handling, submission of specimen other than nasopharyngeal swab, presence of viral mutation(s) within the areas targeted by this assay, and inadequate number of viral copies (<250 copies / mL). A negative result must be combined with clinical observations, patient history, and epidemiological information.  Fact Sheet for Patients:   BoilerBrush.com.cy  Fact Sheet for Healthcare Providers: https://pope.com/  This test is not yet approved or  cleared by the [place] FDA and has been authorized for detection and/or diagnosis of SARS-CoV-2 by FDA under an Emergency Use Authorization (EUA).  This EUA will remain in effect (meaning this test can be used) for the duration of the COVID-19 declaration under Section 564(b)(1) of the Act, 21 U.S.C. section 360bbb-3(b)(1), unless the authorization is terminated or revoked sooner.  Performed at [facility], [address] [place], [place] [address]   MRSA PCR Screening     Status: None   Collection Time: 05/03/20 11:57 PM   Specimen: Nasopharyngeal  Result Value Ref Range Status   MRSA by PCR NEGATIVE NEGATIVE Final    Comment:        The GeneXpert MRSA Assay (FDA approved for NASAL specimens only), is one component of a comprehensive MRSA colonization surveillance program. It is not intended to diagnose MRSA infection nor to guide or monitor treatment for MRSA infections. Performed at [facility], [address] [place], [place] [address]   Body fluid culture     Status: None   Collection Time: 05/07/20 12:00 PM   Specimen: PATH Cytology Peritoneal  fluid  Result Value Ref Range Status   Specimen Description   Final    PERITONEAL Performed at [facility], [address] [place], [place] [address]    Special Requests   Final    PERITONEAL Performed at [facility], [address] [place], [place] [address]    Gram Stain   Final    WBC PRESENT,BOTH PMN AND MONONUCLEAR NO ORGANISMS SEEN CYTOSPIN SMEAR    Culture   Final    NO GROWTH 3 DAYS Performed at [facility], [address] [place], [place] [address]    Report Status 05/11/2020 FINAL  Final  Culture, blood (single) w Reflex to ID Panel     Status: None   Collection Time: 05/11/20  7:59 AM   Specimen: BLOOD  Result Value Ref Range Status   Specimen Description BLOOD LEFT WRIST  Final   Special Requests   Final    BOTTLES DRAWN AEROBIC AND ANAEROBIC Blood Culture adequate volume   Culture   Final    NO GROWTH 5 DAYS Performed at [facility], [address] [place], [place] [address]    Report Status 05/16/2020 FINAL  Final  CULTURE, BLOOD (ROUTINE X 2) w Reflex to ID Panel     Status: None   Collection Time: 05/15/20 11:52 PM   Specimen: Left Antecubital; Blood  Result Value Ref Range Status   Specimen Description LEFT ANTECUBITAL  Final   Special Requests   Final    BOTTLES DRAWN AEROBIC AND ANAEROBIC Blood Culture adequate volume   Culture   Final    NO GROWTH 5 DAYS Performed at [facility], [address] [place], [place] [address]    Report Status 05/21/2020 FINAL  Final  CULTURE, BLOOD (ROUTINE X 2) w Reflex to ID Panel     Status: None   Collection Time: 05/15/20 11:53 PM   Specimen: BLOOD LEFT HAND  Result Value Ref Range Status   Specimen Description BLOOD LEFT HAND  Final   Special Requests   Final    BOTTLES DRAWN AEROBIC AND ANAEROBIC Blood  Culture adequate volume   Culture   Final    NO GROWTH 5 DAYS Performed at [facility], [address] [place], [place] [address]    Report Status 05/21/2020 FINAL  Final  MRSA PCR Screening     Status: None   Collection Time: 05/16/20 12:24 AM   Specimen: Nasopharyngeal  Result Value Ref Range Status   MRSA by PCR NEGATIVE NEGATIVE Final    Comment:        The GeneXpert MRSA Assay (FDA approved for NASAL specimens only), is one component of a comprehensive MRSA colonization surveillance program. It is not intended to diagnose MRSA infection nor to guide or monitor treatment for MRSA infections. Performed at [facility], [address] [place], [place] [address]   Urine Culture     Status: None   Collection Time: 05/16/20  3:27 AM   Specimen: Urine, Random  Result Value Ref Range Status   Specimen Description   Final    URINE, RANDOM Performed at [facility], [address] [place], [place] [address]    Special Requests   Final    NONE Performed at [facility], [address] [place], [place] [address]    Culture   Final    NO GROWTH Performed at [facility], [address] [place], [place] [address]  Report Status 05/17/2020 FINAL  Final  Gastrointestinal Panel by PCR , Stool     Status: None   Collection Time: 05/17/20  3:06 PM   Specimen: Stool  Result Value Ref Range Status   Campylobacter species NOT DETECTED NOT DETECTED Final   Plesimonas shigelloides NOT DETECTED NOT DETECTED Final   Salmonella species NOT DETECTED NOT DETECTED Final   Yersinia enterocolitica NOT DETECTED NOT DETECTED Final   Vibrio species NOT DETECTED NOT DETECTED Final   Vibrio cholerae NOT DETECTED NOT DETECTED Final   Enteroaggregative E coli (EAEC) NOT DETECTED NOT DETECTED Final   Enteropathogenic E coli (EPEC) NOT DETECTED NOT DETECTED Final   Enterotoxigenic E coli (ETEC) NOT DETECTED NOT DETECTED Final   Shiga like toxin  producing E coli (STEC) NOT DETECTED NOT DETECTED Final   Shigella/Enteroinvasive E coli (EIEC) NOT DETECTED NOT DETECTED Final   Cryptosporidium NOT DETECTED NOT DETECTED Final   Cyclospora cayetanensis NOT DETECTED NOT DETECTED Final   Entamoeba histolytica NOT DETECTED NOT DETECTED Final   Giardia lamblia NOT DETECTED NOT DETECTED Final   Adenovirus F40/41 NOT DETECTED NOT DETECTED Final   Astrovirus NOT DETECTED NOT DETECTED Final   Norovirus GI/GII NOT DETECTED NOT DETECTED Final   Rotavirus A NOT DETECTED NOT DETECTED Final   Sapovirus (I, II, IV, and V) NOT DETECTED NOT DETECTED Final    Comment: Performed at [facility], [address] [place], [place] [address]  C Difficile Quick Screen (NO PCR Reflex)     Status: None   Collection Time: 05/17/20  3:06 PM   Specimen: STOOL  Result Value Ref Range Status   C Diff antigen NEGATIVE NEGATIVE Final   C Diff toxin NEGATIVE NEGATIVE Final   C Diff interpretation No C. difficile detected.  Final    Comment: Performed at [facility], [address] [place], [place] [address]  Aerobic Culture (superficial specimen)     Status: None (Preliminary result)   Collection Time: 05/21/20  2:30 PM   Specimen: Wound  Result Value Ref Range Status   Specimen Description   Final    WOUND Performed at [facility], [address], [place], [place] [address]    Special Requests   Final    NONE Performed at [facility], [address] [place], [place] [address]    Gram Stain NO WBC SEEN NO ORGANISMS SEEN   Final   Culture   Final    NO GROWTH < 24 HOURS Performed at [facility], [address], [place], [place] [address]    Report Status PENDING  Incomplete    Coagulation Studies: No results for input(s): LABPROT, INR in the last 72 hours.  Imaging: CT ABDOMEN PELVIS WO CONTRAST  Result Date: 05/22/2020 CLINICAL DATA:  Anemia EXAM: CT ABDOMEN AND PELVIS WITHOUT CONTRAST TECHNIQUE:  Multidetector CT imaging of the abdomen and pelvis was performed following the standard protocol without IV contrast. COMPARISON:  05/16/2020 FINDINGS: Lower chest: Moderate bilateral pleural effusions, left greater than right, with compressive atelectasis. Findings are stable. Hepatobiliary: No focal hepatic abnormality. Gallbladder unremarkable. Pancreas: Pancreatic head cyst again noted, 2.5 cm. Peripancreatic edema again noted, similar to prior study. Small pancreatic body cyst measures 11 mm, stable. 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 Spleen: No focal abnormality.  Normal size. Adrenals/Urinary Tract: No adrenal abnormality. No focal renal abnormality. No stones or hydronephrosis. Urinary bladder is unremarkable. Foley catheter present within the bladder. Stomach/Bowel: Stomach, large  and small bowel grossly unremarkable. Previously seen rectal tube no longer visualized. Vascular/Lymphatic: No evidence of aneurysm or adenopathy. Reproductive: No visible focal abnormality. Other: Moderate ascites in the abdomen and pelvis.  No change. Musculoskeletal: No acute bony abnormality. Healing left lateral rib fractures. Diffuse anasarca again noted, stable. IMPRESSION: Moderate bilateral pleural effusions, left larger than right with compressive atelectasis. Stable. Stable ascites throughout the abdomen and pelvis and anasarca changes. Peripancreatic edema again noted with pseudo cysts in the pancreatic body and head/uncinate process. No significant change since prior study. Overall, exam is stable since recent study. Electronically Signed   By: Charlett Nose M.D.   On: 05/22/2020 12:03   DG Chest Port 1 View  Result Date: 05/22/2020 CLINICAL DATA:  Chest pain. EXAM: PORTABLE CHEST 1 VIEW COMPARISON:  May 15, 2020. FINDINGS: The heart size and mediastinal contours are within normal limits. Right-sided PICC line is noted with distal tip in expected position of the SVC. No pneumothorax is  noted. Mild to moderate left pleural effusion is noted with increased left basilar atelectasis or infiltrate. Minimal right pleural effusion is noted with minimal right basilar subsegmental atelectasis. Mild central pulmonary vascular congestion is noted. The visualized skeletal structures are unremarkable. IMPRESSION: Mild to moderate left pleural effusion is noted with increased left basilar atelectasis or infiltrate. Minimal right pleural effusion is noted with minimal right basilar subsegmental atelectasis. Mild central pulmonary vascular congestion is noted. Electronically Signed   By: Lupita Raider M.D.   On: 05/22/2020 08:57    Medications:  I have reviewed the patient's current medications. Scheduled: . Chlorhexidine Gluconate Cloth  6 each Topical Daily  . folic acid  1 mg Intravenous Daily  . Gerhardt's butt cream   Topical TID  . insulin aspart  0-9 Units Subcutaneous Q6H  . metoprolol tartrate  12.5 mg Oral BID  . pantoprazole (PROTONIX) IV  40 mg Intravenous Q12H  . sodium chloride flush  3 mL Intravenous Q12H    Assessment/Plan: 34 y.o.malewith a long history of ETOH abuse. Abruptly stopped drinking the day before presentation (6/24) and became progressively confused. Was brought to the ED by his wife and while in the ED was witnessed to have a seizure. Patient has not had further seizures but remained confused and lethargic.MRI of the brain personally reviewed and reveals a possible cerebral venous thrombosis as well as abnormal diffusion in the right cerebellum, bilateral parietal lobes and left occipital pole. 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 Thyroid normal. EEGonly significant for slowing. B1normal.Mental statusworsened on 7/6. Repeat MRI of the brain repeatedand showed nonew infarcts. No evidence of hydrocephalus or hemorrhage.Paracentesis performed on7/7. Patient with improved mental statusinitially after paracentesis but has since  been more lethargic. No evidence of seizure activity.Repeat CT and CTV shows no evidence of hemorrhage or additional infarcts.  No progression of CVT.    - Mentation is fluctuating and has periods of likely delirium  - Poor PO intake, would consider Marinol for appetite stimulation but that is depending on his LFTs.  - Pt is being transfused but had a reaction/rash - maybe getting him out of ICU/stepdown to help with mentation.  - for CTV would like to continue heparin or transition to lovenox BID. Will need coumadin long term. This all depends on hemoglobin.

## 2020-05-23 ENCOUNTER — Inpatient Hospital Stay: Payer: Medicaid Other

## 2020-05-23 ENCOUNTER — Ambulatory Visit (HOSPITAL_COMMUNITY): Payer: Medicaid Other | Admitting: Dermatology

## 2020-05-23 DIAGNOSIS — R21 Rash and other nonspecific skin eruption: Secondary | ICD-10-CM | POA: Diagnosis not present

## 2020-05-23 LAB — COMPREHENSIVE METABOLIC PANEL
ALT: 87 U/L — ABNORMAL HIGH (ref 0–44)
AST: 79 U/L — ABNORMAL HIGH (ref 15–41)
Albumin: 1.4 g/dL — ABNORMAL LOW (ref 3.5–5.0)
Alkaline Phosphatase: 150 U/L — ABNORMAL HIGH (ref 38–126)
Anion gap: 12 (ref 5–15)
BUN: 19 mg/dL (ref 6–20)
CO2: 27 mmol/L (ref 22–32)
Calcium: 8.3 mg/dL — ABNORMAL LOW (ref 8.9–10.3)
Chloride: 95 mmol/L — ABNORMAL LOW (ref 98–111)
Creatinine, Ser: 0.52 mg/dL — ABNORMAL LOW (ref 0.61–1.24)
GFR calc Af Amer: >60 mL/min (ref >60)
GFR calc non Af Amer: >60 mL/min (ref >60)
Glucose, Bld: 131 mg/dL — ABNORMAL HIGH (ref 70–99)
Potassium: 4.5 mmol/L (ref 3.5–5.1)
Sodium: 134 mmol/L — ABNORMAL LOW (ref 135–145)
Total Bilirubin: 0.5 mg/dL (ref 0.3–1.2)
Total Protein: 4.9 g/dL — ABNORMAL LOW (ref 6.5–8.1)

## 2020-05-23 LAB — PROTEIN S, TOTAL AND FREE
Protein S Ag, Free: 126 % (ref 57–157)
Protein S Ag, Total: 75 % (ref 60–150)

## 2020-05-23 LAB — ALBUMIN, PLEURAL OR PERITONEAL FLUID: Albumin, Fluid: <1 g/dL

## 2020-05-23 LAB — BODY FLUID CELL COUNT WITH DIFFERENTIAL
Eos, Fluid: 1 %
Lymphs, Fluid: 43 %
Monocyte-Macrophage-Serous Fluid: 22 %
Neutrophil Count, Fluid: 31 %
Total Nucleated Cell Count, Fluid: 398 cu mm

## 2020-05-23 LAB — MAGNESIUM: Magnesium: 1.7 mg/dL (ref 1.7–2.4)

## 2020-05-23 LAB — CBC
HCT: 20.7 % — ABNORMAL LOW (ref 39.0–52.0)
Hemoglobin: 6.9 g/dL — ABNORMAL LOW (ref 13.0–17.0)
MCH: 30.9 pg (ref 26.0–34.0)
MCHC: 33.3 g/dL (ref 30.0–36.0)
MCV: 92.8 fL (ref 80.0–100.0)
Platelets: 88 10*3/uL — ABNORMAL LOW (ref 150–400)
RBC: 2.23 MIL/uL — ABNORMAL LOW (ref 4.22–5.81)
RDW: 14.9 % (ref 11.5–15.5)
WBC: 15.6 10*3/uL — ABNORMAL HIGH (ref 4.0–10.5)
nRBC: 0 % (ref 0.0–0.2)

## 2020-05-23 LAB — ANTITHROMBIN PANEL
AT III AG PPP IMM-ACNC: 30 % — ABNORMAL LOW (ref 72–124)
Antithrombin Activity: 59 % — ABNORMAL LOW (ref 75–135)

## 2020-05-23 LAB — AMMONIA: Ammonia: 12 umol/L (ref 9–35)

## 2020-05-23 LAB — HEMATOCRIT: HCT: 20.8 % — ABNORMAL LOW (ref 39.0–52.0)

## 2020-05-23 LAB — PHOSPHORUS: Phosphorus: 4.6 mg/dL (ref 2.5–4.6)

## 2020-05-23 LAB — PROCALCITONIN: Procalcitonin: 1.34 ng/mL

## 2020-05-23 LAB — TRANSFUSION REACTION
DAT C3: NEGATIVE
Post RXN DAT IgG: NEGATIVE

## 2020-05-23 LAB — PREPARE RBC (CROSSMATCH)

## 2020-05-23 LAB — GLUCOSE, CAPILLARY
Glucose-Capillary: 129 mg/dL — ABNORMAL HIGH (ref 70–99)
Glucose-Capillary: 129 mg/dL — ABNORMAL HIGH (ref 70–99)
Glucose-Capillary: 143 mg/dL — ABNORMAL HIGH (ref 70–99)

## 2020-05-23 LAB — LACTIC ACID, PLASMA: Lactic Acid, Venous: 1 mmol/L (ref 0.5–1.9)

## 2020-05-23 LAB — AMYLASE, PLEURAL OR PERITONEAL FLUID: Amylase, Fluid: 4840 U/L

## 2020-05-23 LAB — LACTATE DEHYDROGENASE, PLEURAL OR PERITONEAL FLUID: LD, Fluid: 261 U/L — ABNORMAL HIGH (ref 3–23)

## 2020-05-23 LAB — PROTEIN, PLEURAL OR PERITONEAL FLUID: Total protein, fluid: <3 g/dL

## 2020-05-23 IMAGING — US US PARACENTESIS
1 series · 5 of 5 positions shown · non-contrast
Comparison: Ultrasound-guided paracentesis-[DATE] (yielding 3 L
of peritoneal fluid) 1; CT abdomen pelvis-[DATE]

MEDICATIONS:
None.

COMPLICATIONS:
None immediate.

INDICATION: History of alcoholic cirrhosis with recurrent symptomatic ascites
and concern for hemoperitoneum. Please perform ultrasound-guided
paracentesis for diagnostic and therapeutic purposes.

EXAM:
ULTRASOUND-GUIDED PARACENTESIS
TECHNIQUE: Informed written consent was obtained from the patient after a
discussion of the risks, benefits and alternatives to treatment. A
timeout was performed prior to the initiation of the procedure.

[Series 1: us paracentesis · 0.26mm/px · 5 of 5 slices shown]
[im 1/5]
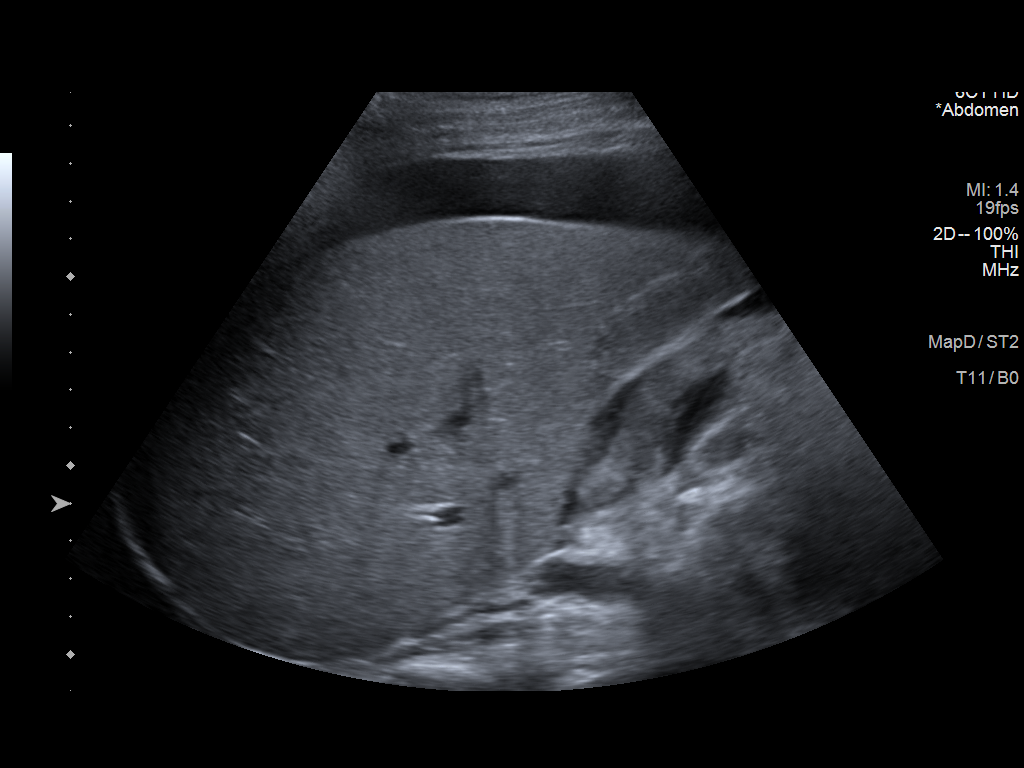
[im 2/5]
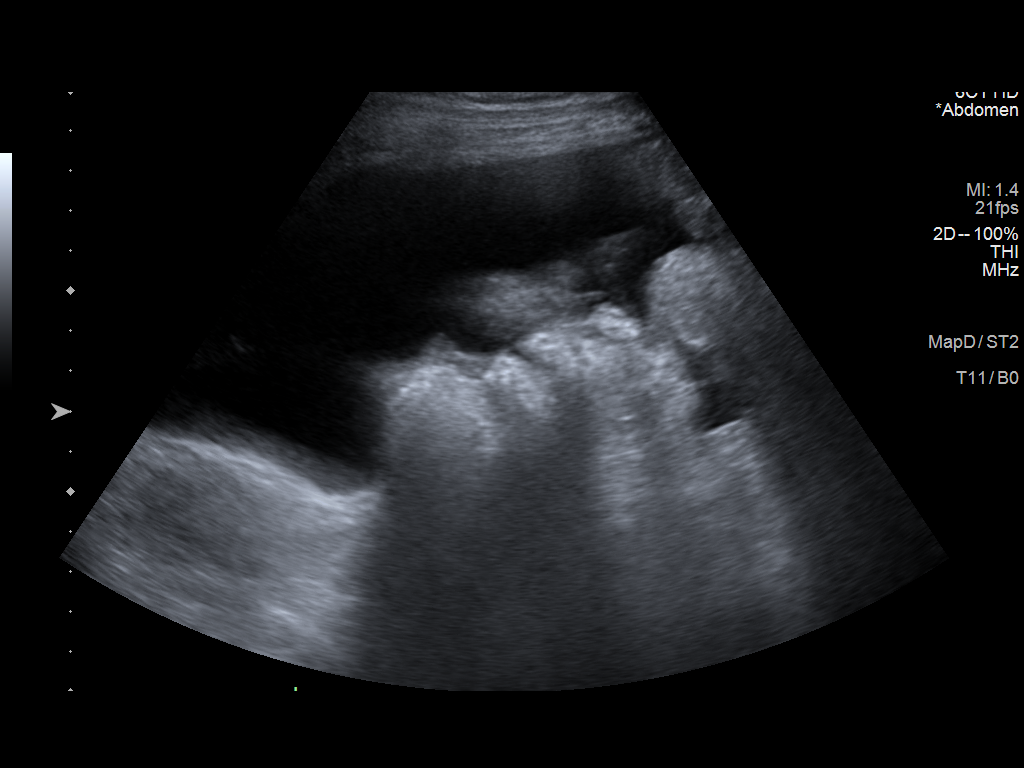
[im 3/5]
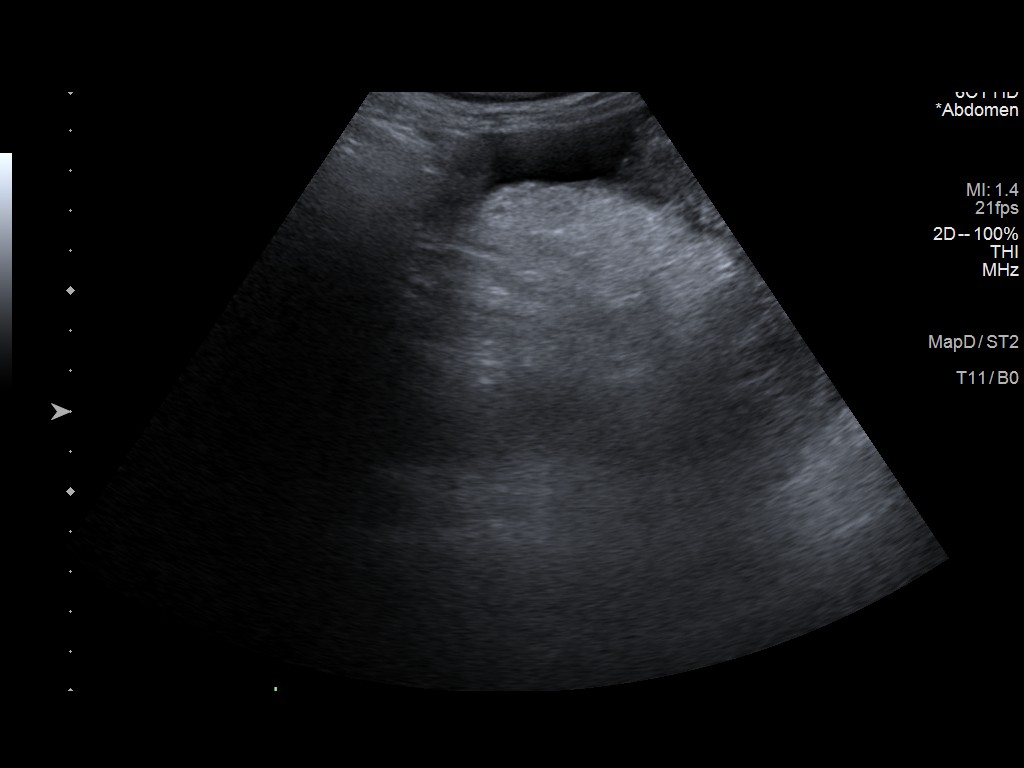
[im 4/5]
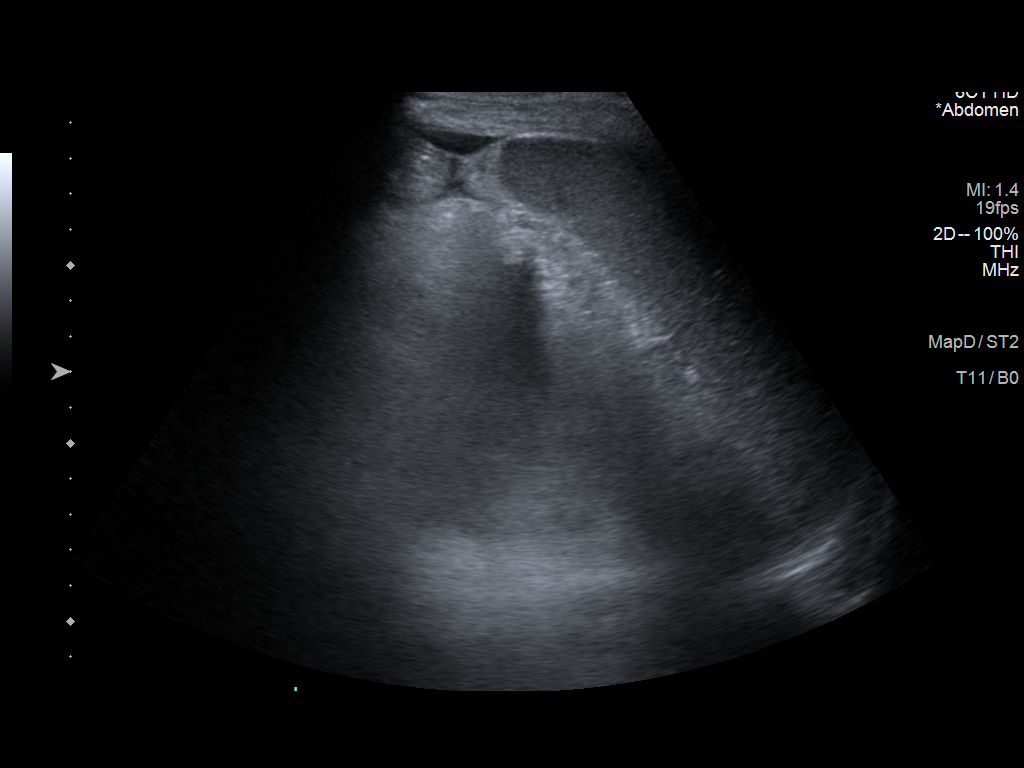
[im 5/5]
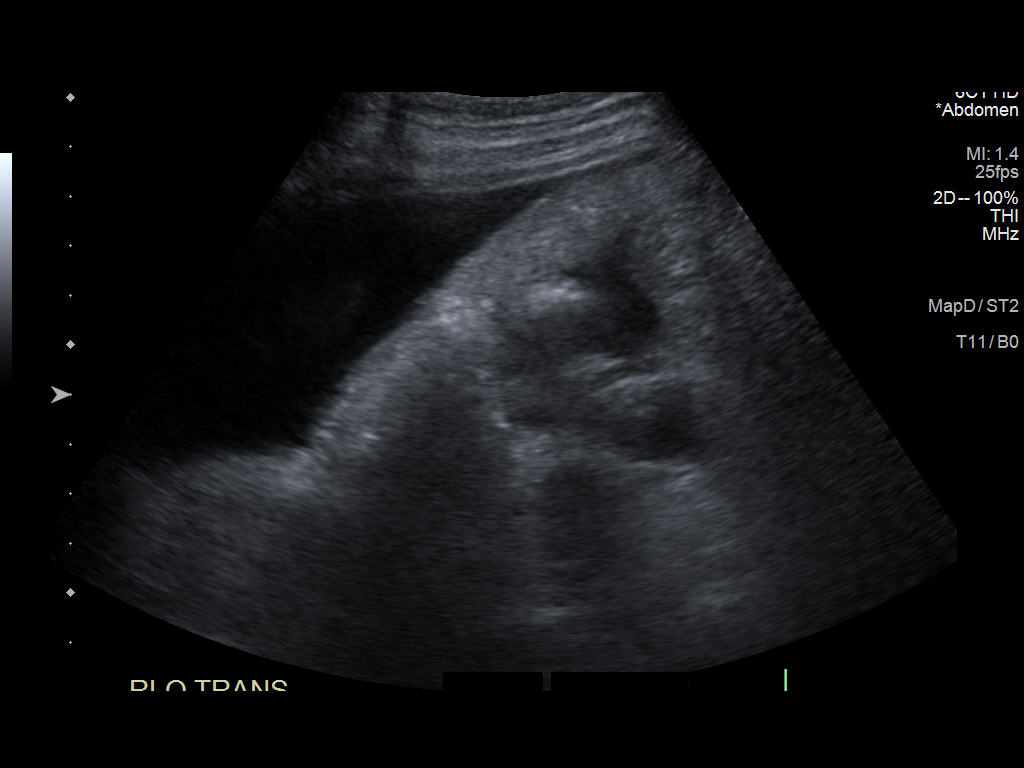

[5 of 5 positions shown; findings below may reference images not displayed]

Initial ultrasound scanning demonstrates a small amount of ascites
within the right lower abdomen which was subsequently prepped and
draped in the usual sterile fashion. 1% lidocaine with epinephrine
was used for local anesthesia. Under direct ultrasound guidance, a
19 gauge, 7-cm, Yueh catheter was introduced. An ultrasound image
was saved for documentation purposed. The paracentesis was
performed. Unfortunately, the access catheter was inadvertently
removed prior to completing the paracentesis however given patient's
anxiety during the initial catheter placement the decision was made
NOT to replace the catheter at this time and the procedure was
terminated. A dressing was applied. The patient tolerated the
procedure well without immediate post procedural complication.
FINDINGS: A total of approximately 600 cc of serous, non bloody fluid was
removed. Samples were sent to the laboratory as requested by the
clinical team.
IMPRESSION: Successful ultrasound-guided paracentesis yielding 600 cc of serous,
non bloody peritoneal fluid.

## 2020-05-23 IMAGING — CT CT HEAD W/O CM
3 series · 15 of 47 positions shown, 18 images · non-contrast
Comparison: CT venogram head [DATE], brain MRI [DATE], head
CT [DATE].

CLINICAL DATA: Altered mental status, unclear cause.

EXAM:
CT HEAD WITHOUT CONTRAST
TECHNIQUE: Contiguous axial images were obtained from the base of the skull
through the vertex without intravenous contrast.

[Series 3: head wo · axial · 0.44mm/px · z∈[+497,+622]mm · 9 of 31 slices shown, 12 images]
[im 3/31  brain]
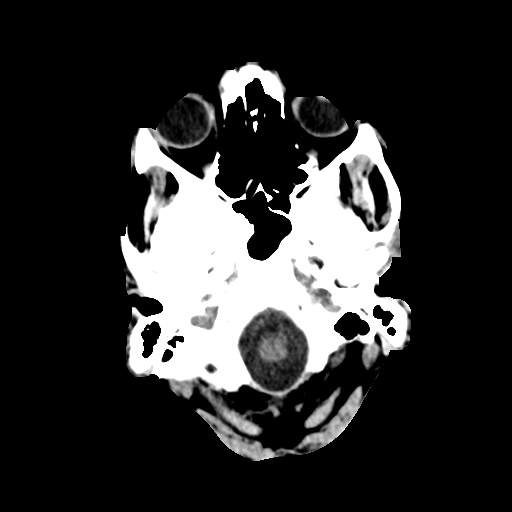
[im 3/31  bone]
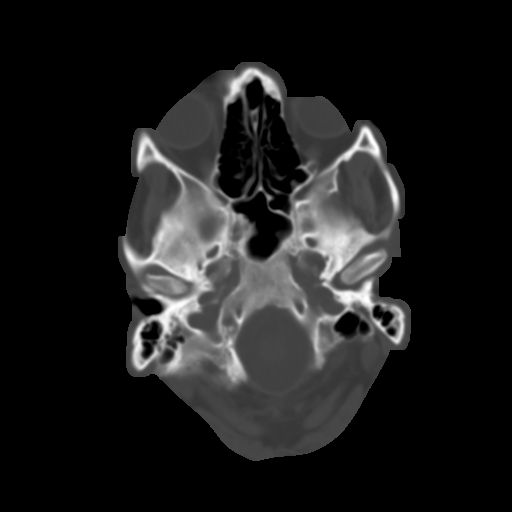
[im 6/31  brain]
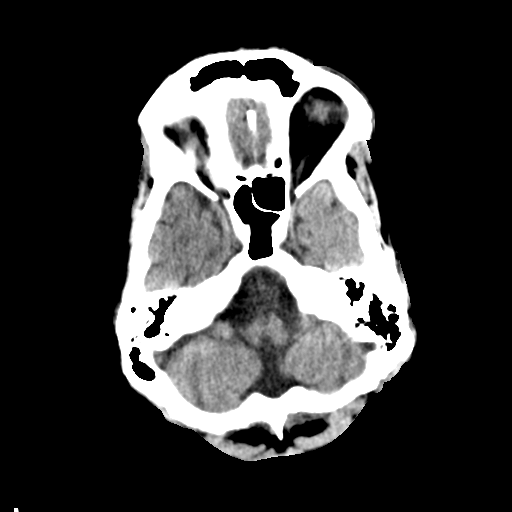
[im 9/31  brain]
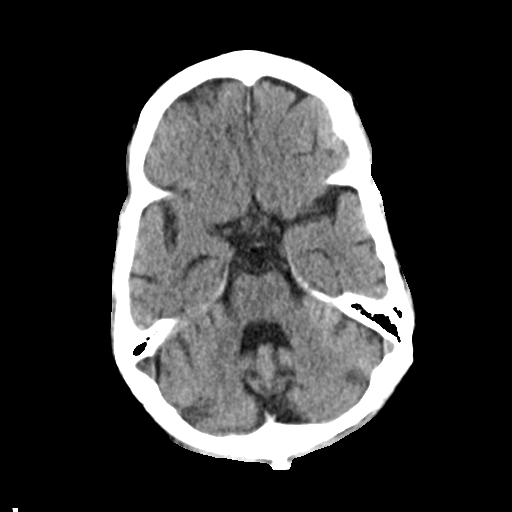
[im 12/31  brain]
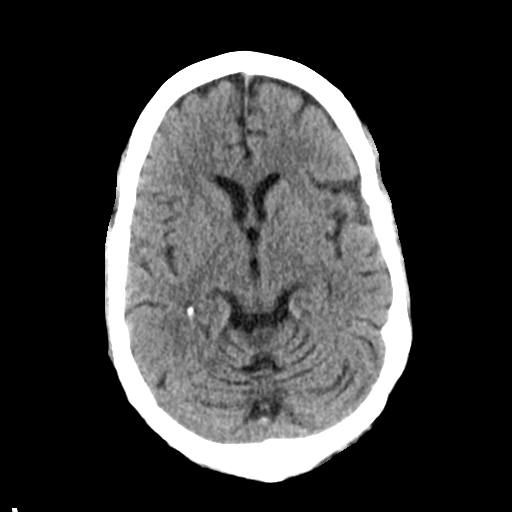
[im 16/31  brain]
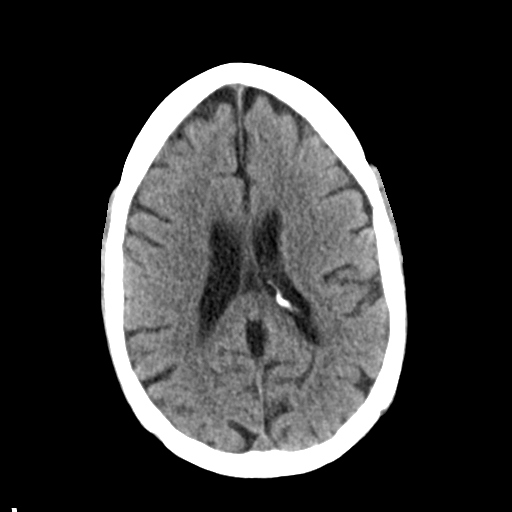
[im 16/31  bone]
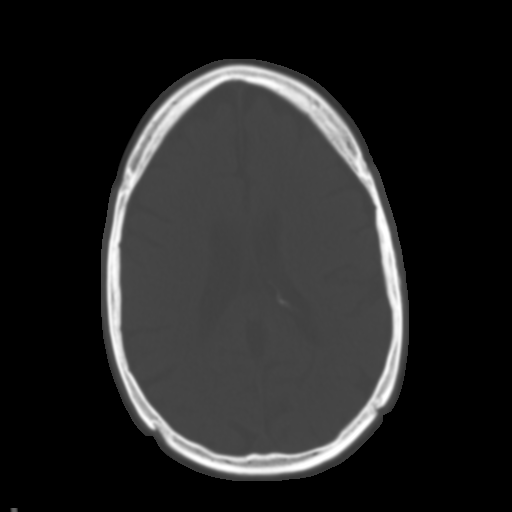
[im 19/31  brain]
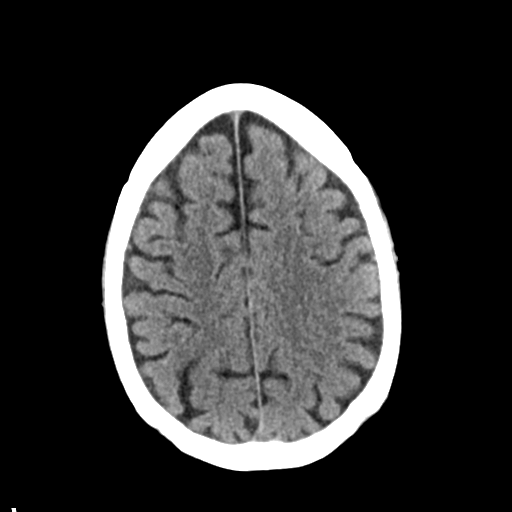
[im 22/31  brain]
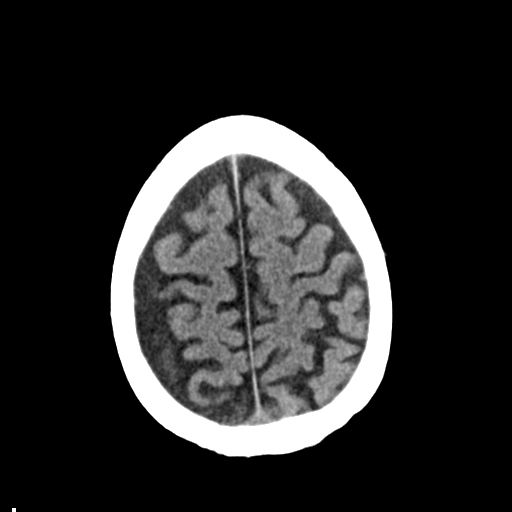
[im 25/31  brain]
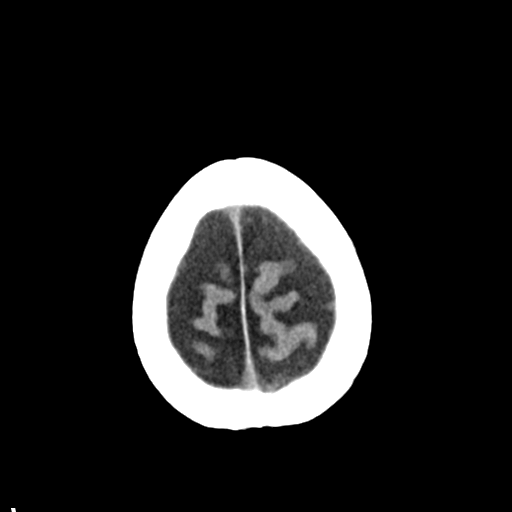
[im 28/31  brain]
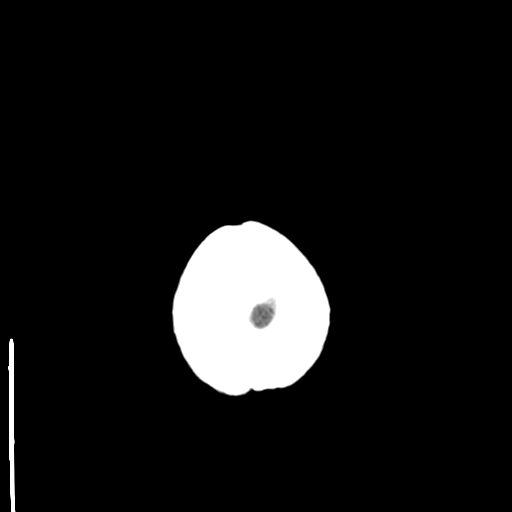
[im 28/31  bone]
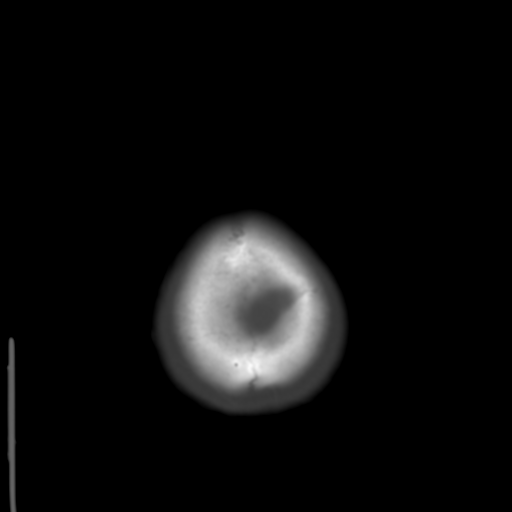

[Series 4: coronal soft tissue · coronal · 0.29mm/px · 3 of 62 slices shown]
[im 21/62  brain]
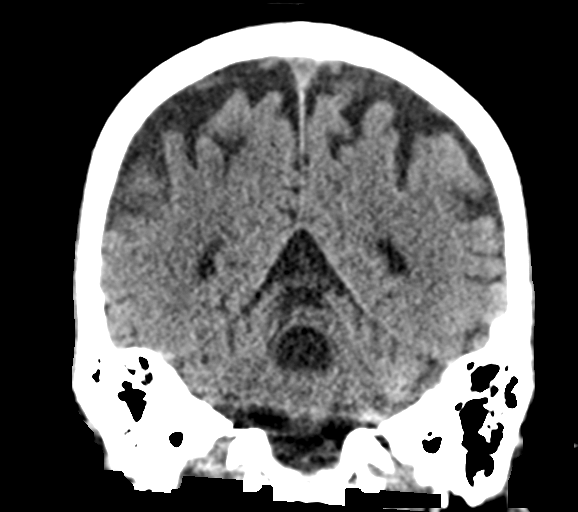
[im 28/62  brain]
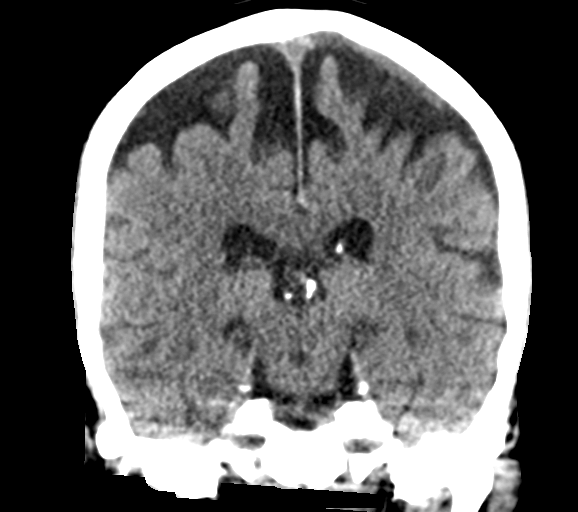
[im 34/62  brain]
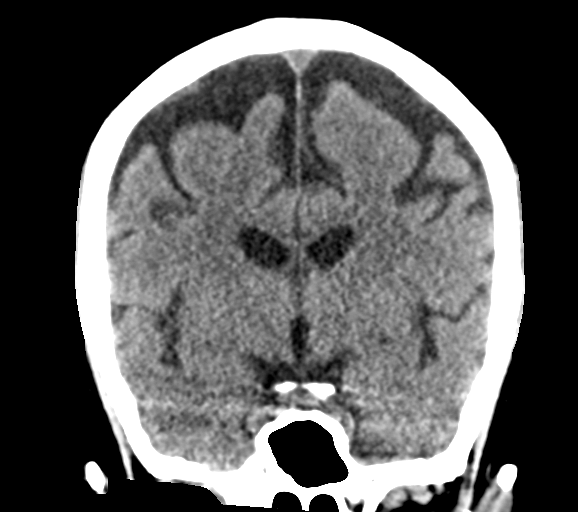

[Series 5: sagittal soft tissue · sagittal · 0.30mm/px · 3 of 45 slices shown]
[im 15/45  brain]
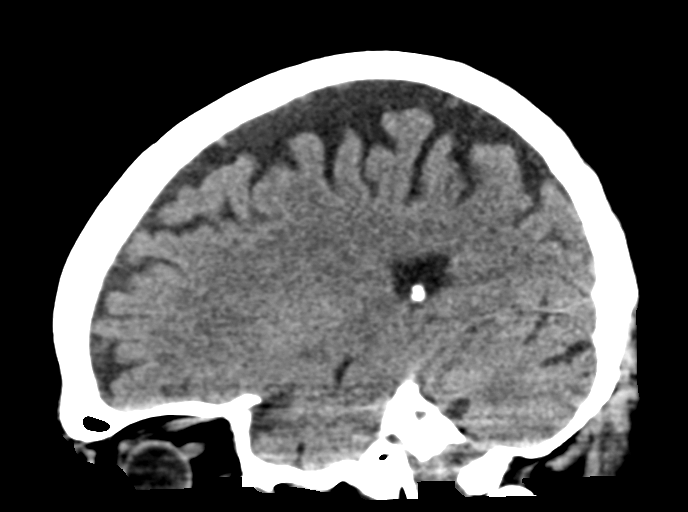
[im 23/45  brain]
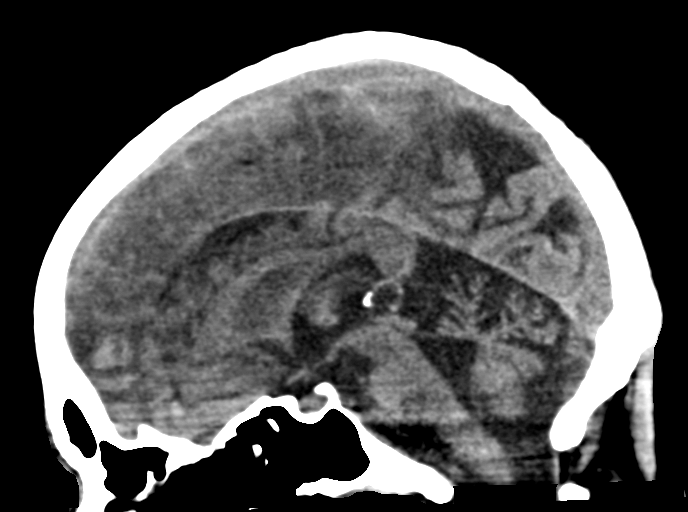
[im 30/45  brain]
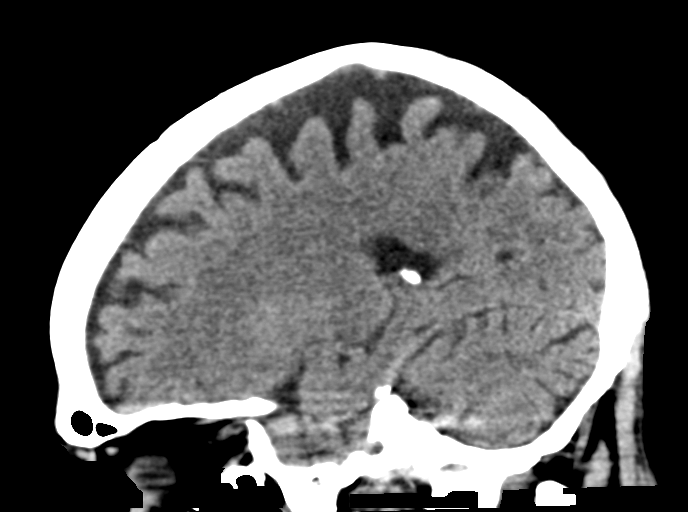

[15 of 47 positions shown; findings below may reference images not displayed]

FINDINGS: Brain:

Cerebral volume is normal for age.

Redemonstrated subtle cortical hyperdensity within the right
parietal lobe and small focus of hypodensity within the right
cerebellum at sites of known subacute venous infarcts.

No interval infarct is identified. No evidence of acute intracranial
hemorrhage.

No extra-axial fluid collection.

No evidence of intracranial mass.

No midline shift.

Vascular: No hyperdense vessel. Venous thrombus within the torcula
and left transverse sinus was better appreciated on prior CT
venogram head [DATE].

Skull: Normal. Negative for fracture or focal lesion.

Sinuses/Orbits: Visualized orbits show no acute finding. No
significant paranasal sinus disease or mastoid effusion at the
imaged levels.
IMPRESSION: Redemonstrated subacute venous infarcts within the right parietal
lobe and right cerebellum.

No CT evidence of interval intracranial abnormality.

## 2020-05-23 MED ORDER — METHYLPREDNISOLONE SODIUM SUCC 125 MG IJ SOLR: 80.0000 mg | Freq: Two times a day (BID) | INTRAMUSCULAR | Status: DC

## 2020-05-23 MED ORDER — FUROSEMIDE 10 MG/ML IJ SOLN
20.0000 mg | Freq: Once | INTRAMUSCULAR | Status: AC
  Administered 2020-05-24: 20 mg via INTRAVENOUS
  Filled 2020-05-23: qty 2

## 2020-05-23 MED ORDER — DIPHENHYDRAMINE HCL 50 MG/ML IJ SOLN
25.0000 mg | Freq: Once | INTRAMUSCULAR | Status: AC
  Administered 2020-05-23: 25 mg via INTRAVENOUS
  Filled 2020-05-23: qty 1

## 2020-05-23 MED ORDER — ENSURE ENLIVE PO LIQD
237.0000 mL | Freq: Three times a day (TID) | ORAL | Status: DC
  Administered 2020-05-23 – 2020-05-31 (×18): 237 mL via ORAL

## 2020-05-23 MED ORDER — TRAVASOL 10 % IV SOLN
INTRAVENOUS | Status: DC
  Filled 2020-05-23: qty 771.12

## 2020-05-23 MED ORDER — METHYLPREDNISOLONE SODIUM SUCC 125 MG IJ SOLR
80.0000 mg | Freq: Every day | INTRAMUSCULAR | Status: DC
  Administered 2020-05-23 – 2020-05-25 (×3): 80 mg via INTRAVENOUS
  Filled 2020-05-23 (×3): qty 2

## 2020-05-23 MED ORDER — FUROSEMIDE 10 MG/ML IJ SOLN: 20.0000 mg | Freq: Once | INTRAMUSCULAR | Status: DC

## 2020-05-23 MED ORDER — SODIUM CHLORIDE 0.9% IV SOLUTION: Freq: Once | INTRAVENOUS | Status: DC

## 2020-05-23 NOTE — Progress Notes (Signed)
Subjective: Slight improvement in mentation. States slept well.   Objective: Current vital signs: BP 125/82 (BP Location: Left Arm)   Pulse (!) 110   Temp 98.1 F (36.7 C) (Oral)   Resp 19   Ht 5\' 7"  (1.702 m)   Wt 68.2 kg   SpO2 95%   BMI 23.55 kg/m  Vital signs in last 24 hours: Temp:  [98 F (36.7 C)-99.1 F (37.3 C)] 98.1 F (36.7 C) (07/14 0700) Pulse Rate:  [97-110] 110 (07/14 0600) Resp:  [17-23] 19 (07/14 0600) BP: (119-134)/(74-85) 125/82 (07/14 0600) SpO2:  [90 %-96 %] 95 % (07/14 0600)  Intake/Output from previous day: 07/13 0701 - 07/14 0700 In: 819 [P.O.:50; I.V.:454; Blood:315] Out: 7200 [Urine:7200] Intake/Output this shift: No intake/output data recorded. Nutritional status:  Diet Order            Diet regular Room service appropriate? Yes; Fluid consistency: Thin  Diet effective now                 Neurologic Exam: Mental Status: Lethargic but arousable. Dysarthric. Able to follow simple commands with delay.  At times able to have full conversation.   Cranial Nerves: 8/14 equal and both reactive to light.   III,IV, VI: extra-ocular motions intact bilaterally V,VII:face symmetric Motor: Moves extremities weakly against gravity Sensory:Responds to noxious stimuli in all extremities  Lab Results: Basic Metabolic Panel: Recent Labs  Lab 05/19/20 0214 05/19/20 0214 05/20/20 0439 05/20/20 0439 05/21/20 0434 05/22/20 0155 05/23/20 0458  NA 133*  132*  --  137  --  137 132* 134*  K 4.6  4.5  --  4.9  --  5.0 4.7 4.5  CL 101  101  --  103  --  103 98 95*  CO2 25  25  --  28  --  28 27 27   GLUCOSE 145*  145*  --  149*  --  133* 131* 131*  BUN 13  13  --  14  --  18 18 19   CREATININE 0.69  0.72  --  0.58*  --  0.57* 0.54* 0.52*  CALCIUM 7.7*  7.6*   < > 8.1*   < > 8.3* 8.1* 8.3*  MG 1.9  --  1.7  --  1.7 1.8 1.7  PHOS 3.4  --  3.9  --  4.7* 4.9* 4.6   < > = values in this interval not displayed.    Liver Function  Tests: Recent Labs  Lab 05/17/20 0535 05/17/20 0535 05/18/20 0517 05/18/20 0517 05/19/20 0214 05/20/20 0439 05/21/20 0434 05/22/20 0155 05/23/20 0458  AST 43*  --  36  --   --   --  115* 138* 79*  ALT 20  --  18  --   --   --  61* 105* 87*  ALKPHOS 99  --  86  --   --   --  188* 169* 150*  BILITOT 0.6  --  0.4  --   --   --  0.5 0.6 0.5  PROT 4.3*  --  4.1*  --   --   --  4.6* 4.7* 4.9*  ALBUMIN 1.8*   < > 1.5*   < > 1.6* 1.4* 1.4* 1.4* 1.4*   < > = values in this interval not displayed.   Recent Labs  Lab 05/17/20 0535 05/18/20 1830 05/21/20 1454  LIPASE 337* 171* 241*   No results for input(s): AMMONIA in the last 168 hours.  CBC:  Recent Labs  Lab 05/19/20 0214 05/20/20 0439 05/21/20 0434 05/22/20 0155 05/23/20 0458  WBC 16.9* 15.9* 15.7* 19.0* 15.6*  NEUTROABS  --   --  11.9*  --   --   HGB 9.7* 8.1* 7.6* 6.8* 6.9*  HCT 28.0* 24.3* 23.0* 20.7* 20.7*  MCV 90.3 91.7 92.4 94.1 92.8  PLT 94* 77* 79* 85* 88*    Cardiac Enzymes: Recent Labs  Lab 05/21/20 1454 05/22/20 0155  CKTOTAL 30* 23*    Lipid Panel: Recent Labs  Lab 05/16/20 1230 05/21/20 0434  TRIG 45 66    CBG: Recent Labs  Lab 05/22/20 0501 05/22/20 1140 05/22/20 2111 05/23/20 0011 05/23/20 0508  GLUCAP 134* 88 133* 143* 129*    Microbiology: Results for orders placed or performed during the hospital encounter of 05/03/20  SARS Coronavirus 2 by RT PCR (hospital order, performed in [facility] hospital lab) Nasopharyngeal Nasopharyngeal Swab     Status: None   Collection Time: 05/03/20  8:25 PM   Specimen: Nasopharyngeal Swab  Result Value Ref Range Status   SARS Coronavirus 2 NEGATIVE NEGATIVE Final    Comment: (NOTE) SARS-CoV-2 target nucleic acids are NOT DETECTED.  The SARS-CoV-2 RNA is generally detectable in upper and lower respiratory specimens during the acute phase of infection. The lowest concentration of SARS-CoV-2 viral copies this assay can detect is 250 copies /  mL. A negative result does not preclude SARS-CoV-2 infection and should not be used as the sole basis for treatment or other patient management decisions.  A negative result may occur with improper specimen collection / handling, submission of specimen other than nasopharyngeal swab, presence of viral mutation(s) within the areas targeted by this assay, and inadequate number of viral copies (<250 copies / mL). A negative result must be combined with clinical observations, patient history, and epidemiological information.  Fact Sheet for Patients:   BoilerBrush.com.cy  Fact Sheet for Healthcare Providers: https://pope.com/  This test is not yet approved or  cleared by the [place] FDA and has been authorized for detection and/or diagnosis of SARS-CoV-2 by FDA under an Emergency Use Authorization (EUA).  This EUA will remain in effect (meaning this test can be used) for the duration of the COVID-19 declaration under Section 564(b)(1) of the Act, 21 U.S.C. section 360bbb-3(b)(1), unless the authorization is terminated or revoked sooner.  Performed at [facility], [address] [place], [place] [address]   MRSA PCR Screening     Status: None   Collection Time: 05/03/20 11:57 PM   Specimen: Nasopharyngeal  Result Value Ref Range Status   MRSA by PCR NEGATIVE NEGATIVE Final    Comment:        The GeneXpert MRSA Assay (FDA approved for NASAL specimens only), is one component of a comprehensive MRSA colonization surveillance program. It is not intended to diagnose MRSA infection nor to guide or monitor treatment for MRSA infections. Performed at [facility], [address], [place], [place] [address]   Body fluid culture     Status: None   Collection Time: 05/07/20 12:00 PM   Specimen: PATH Cytology Peritoneal fluid  Result Value Ref Range Status   Specimen Description   Final    PERITONEAL Performed  at [facility], [address], [place], [place] [address]    Special Requests   Final    PERITONEAL Performed at [facility], [address] [place], [place] [address]    Gram Stain   Final  WBC PRESENT,BOTH PMN AND MONONUCLEAR NO ORGANISMS SEEN CYTOSPIN SMEAR    Culture   Final    NO GROWTH 3 DAYS Performed at [facility], [address] [place], [place] [address]    Report Status 05/11/2020 FINAL  Final  Culture, blood (single) w Reflex to ID Panel     Status: None   Collection Time: 05/11/20  7:59 AM   Specimen: BLOOD  Result Value Ref Range Status   Specimen Description BLOOD LEFT WRIST  Final   Special Requests   Final    BOTTLES DRAWN AEROBIC AND ANAEROBIC Blood Culture adequate volume   Culture   Final    NO GROWTH 5 DAYS Performed at [facility], [address] [place], [place] [address]    Report Status 05/16/2020 FINAL  Final  CULTURE, BLOOD (ROUTINE X 2) w Reflex to ID Panel     Status: None   Collection Time: 05/15/20 11:52 PM   Specimen: Left Antecubital; Blood  Result Value Ref Range Status   Specimen Description LEFT ANTECUBITAL  Final   Special Requests   Final    BOTTLES DRAWN AEROBIC AND ANAEROBIC Blood Culture adequate volume   Culture   Final    NO GROWTH 5 DAYS Performed at [facility], [address] [place], [place] [address]    Report Status 05/21/2020 FINAL  Final  CULTURE, BLOOD (ROUTINE X 2) w Reflex to ID Panel     Status: None   Collection Time: 05/15/20 11:53 PM   Specimen: BLOOD LEFT HAND  Result Value Ref Range Status   Specimen Description BLOOD LEFT HAND  Final   Special Requests   Final    BOTTLES DRAWN AEROBIC AND ANAEROBIC Blood Culture adequate volume   Culture   Final    NO GROWTH 5 DAYS Performed at [facility], [address] [place], [place] [address]    Report Status 05/21/2020 FINAL  Final  MRSA PCR Screening     Status: None   Collection Time:  05/16/20 12:24 AM   Specimen: Nasopharyngeal  Result Value Ref Range Status   MRSA by PCR NEGATIVE NEGATIVE Final    Comment:        The GeneXpert MRSA Assay (FDA approved for NASAL specimens only), is one component of a comprehensive MRSA colonization surveillance program. It is not intended to diagnose MRSA infection nor to guide or monitor treatment for MRSA infections. Performed at [facility], [address] [place], [place] [address]   Urine Culture     Status: None   Collection Time: 05/16/20  3:27 AM   Specimen: Urine, Random  Result Value Ref Range Status   Specimen Description   Final    URINE, RANDOM Performed at [facility], [address] [place], [place] [address]    Special Requests   Final    NONE Performed at [facility], [address] [place], [place] [address]    Culture   Final    NO GROWTH Performed at [facility], [address] [place], [place] [address]    Report Status 05/17/2020 FINAL  Final  Gastrointestinal Panel by PCR , Stool     Status: None   Collection Time: 05/17/20  3:06 PM   Specimen: Stool  Result Value Ref Range Status   Campylobacter species NOT DETECTED NOT DETECTED Final   Plesimonas shigelloides NOT DETECTED NOT DETECTED Final   Salmonella species NOT DETECTED NOT DETECTED  Final   Yersinia enterocolitica NOT DETECTED NOT DETECTED Final   Vibrio species NOT DETECTED NOT DETECTED Final   Vibrio cholerae NOT DETECTED NOT DETECTED Final   Enteroaggregative E coli (EAEC) NOT DETECTED NOT DETECTED Final   Enteropathogenic E coli (EPEC) NOT DETECTED NOT DETECTED Final   Enterotoxigenic E coli (ETEC) NOT DETECTED NOT DETECTED Final   Shiga like toxin producing E coli (STEC) NOT DETECTED NOT DETECTED Final   Shigella/Enteroinvasive E coli (EIEC) NOT DETECTED NOT DETECTED Final   Cryptosporidium NOT DETECTED NOT DETECTED Final   Cyclospora cayetanensis NOT DETECTED NOT DETECTED Final    Entamoeba histolytica NOT DETECTED NOT DETECTED Final   Giardia lamblia NOT DETECTED NOT DETECTED Final   Adenovirus F40/41 NOT DETECTED NOT DETECTED Final   Astrovirus NOT DETECTED NOT DETECTED Final   Norovirus GI/GII NOT DETECTED NOT DETECTED Final   Rotavirus A NOT DETECTED NOT DETECTED Final   Sapovirus (I, II, IV, and V) NOT DETECTED NOT DETECTED Final    Comment: Performed at [facility], [address] [place], [place] [address]  C Difficile Quick Screen (NO PCR Reflex)     Status: None   Collection Time: 05/17/20  3:06 PM   Specimen: STOOL  Result Value Ref Range Status   C Diff antigen NEGATIVE NEGATIVE Final   C Diff toxin NEGATIVE NEGATIVE Final   C Diff interpretation No C. difficile detected.  Final    Comment: Performed at [facility], [address] [place], [place] [address]  Aerobic Culture (superficial specimen)     Status: None (Preliminary result)   Collection Time: 05/21/20  2:30 PM   Specimen: Wound  Result Value Ref Range Status   Specimen Description   Final    WOUND Performed at [facility], [address], [place], [place] [address]    Special Requests   Final    NONE Performed at [facility], [address] [place], [place] [address]    Gram Stain NO WBC SEEN NO ORGANISMS SEEN   Final   Culture   Final    NO GROWTH 2 DAYS Performed at [facility], [address] [place], [place] [address]    Report Status PENDING  Incomplete    Coagulation Studies: No results for input(s): LABPROT, INR in the last 72 hours.  Imaging: CT ABDOMEN PELVIS WO CONTRAST  Result Date: 05/22/2020 CLINICAL DATA:  Anemia EXAM: CT ABDOMEN AND PELVIS WITHOUT CONTRAST TECHNIQUE: Multidetector CT imaging of the abdomen and pelvis was performed following the standard protocol without IV contrast. COMPARISON:  05/16/2020 FINDINGS: Lower chest: Moderate bilateral pleural effusions, left greater than right, with compressive  atelectasis. Findings are stable. Hepatobiliary: No focal hepatic abnormality. Gallbladder unremarkable. Pancreas: Pancreatic head cyst again noted, 2.5 cm. Peripancreatic edema again noted, similar to prior study. Small pancreatic body cyst measures 11 mm, stable. 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 Spleen: No focal abnormality.  Normal size. Adrenals/Urinary Tract: No adrenal abnormality. No focal renal abnormality. No stones or hydronephrosis. Urinary bladder is unremarkable. Foley catheter present within the bladder. Stomach/Bowel: Stomach, large and small bowel grossly unremarkable. Previously seen rectal tube no longer visualized. Vascular/Lymphatic: No evidence of aneurysm or adenopathy. Reproductive: No visible focal abnormality. Other: Moderate ascites in the abdomen and pelvis.  No change. Musculoskeletal: No acute bony abnormality. Healing left lateral rib fractures. Diffuse anasarca again noted, stable. IMPRESSION: Moderate bilateral pleural effusions, left larger than right with compressive atelectasis. Stable. Stable  ascites throughout the abdomen and pelvis and anasarca changes. Peripancreatic edema again noted with pseudo cysts in the pancreatic body and head/uncinate process. No significant change since prior study. Overall, exam is stable since recent study. Electronically Signed   By: Charlett Nose M.D.   On: 05/22/2020 12:03   DG Chest Port 1 View  Result Date: 05/22/2020 CLINICAL DATA:  Chest pain. EXAM: PORTABLE CHEST 1 VIEW COMPARISON:  May 15, 2020. FINDINGS: The heart size and mediastinal contours are within normal limits. Right-sided PICC line is noted with distal tip in expected position of the SVC. No pneumothorax is noted. Mild to moderate left pleural effusion is noted with increased left basilar atelectasis or infiltrate. Minimal right pleural effusion is noted with minimal right basilar subsegmental atelectasis. Mild central pulmonary vascular congestion  is noted. The visualized skeletal structures are unremarkable. IMPRESSION: Mild to moderate left pleural effusion is noted with increased left basilar atelectasis or infiltrate. Minimal right pleural effusion is noted with minimal right basilar subsegmental atelectasis. Mild central pulmonary vascular congestion is noted. Electronically Signed   By: Lupita Raider M.D.   On: 05/22/2020 08:57    Medications:  I have reviewed the patient's current medications. Scheduled: . Chlorhexidine Gluconate Cloth  6 each Topical Daily  . dronabinol  2.5 mg Oral BID AC  . enoxaparin (LOVENOX) injection  70 mg Subcutaneous Q12H  . feeding supplement (ENSURE ENLIVE)  237 mL Oral TID BM  . folic acid  1 mg Intravenous Daily  . Gerhardt's butt cream   Topical TID  . insulin aspart  0-9 Units Subcutaneous Q6H  . metoprolol tartrate  12.5 mg Oral BID  . pantoprazole (PROTONIX) IV  40 mg Intravenous Q12H  . sodium chloride flush  3 mL Intravenous Q12H  . Vitamin D (Ergocalciferol)  50,000 Units Oral Q7 days    Assessment/Plan: 34 y.o.malewith a long history of ETOH abuse. Abruptly stopped drinking the day before presentation (6/24) and became progressively confused. Was brought to the ED by his wife and while in the ED was witnessed to have a seizure. Patient has not had further seizures but remained confused and lethargic.MRI of the brain personally reviewed and reveals a possible cerebral venous thrombosis as well as abnormal diffusion in the right cerebellum, bilateral parietal lobes and left occipital pole. 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 Thyroid normal. EEGonly significant for slowing. B1normal.Mental statusworsened on 7/6. Repeat MRI of the brain repeatedand showed nonew infarcts. No evidence of hydrocephalus or hemorrhage.Paracentesis performed on7/7. Patient with improved mental statusinitially after paracentesis but has since been more lethargic. No evidence  of seizure activity.Repeat CT and CTV shows no evidence of hemorrhage or additional infarcts.  No progression of CVT.    - Mentation is fluctuating. Pt is likely depressed.  - Poor PO intake, would consider Marinol for appetite stimulation but that is depending on his LFTs.  - Hb of 6.9 - No clear source of bleeding - for CTV switched out to lovenox.  Will need long term anticoagulation but worrid at this time given the long half life with low Hb. Continue lovenox for now and if no clear source of bleeding and Hb stabilizes can switch to long term anticoagulation for about 6 months.

## 2020-05-23 NOTE — Progress Notes (Addendum)
   New Patient Visit  Subjective  Shane Crescenzo Sr. is a 34 y.o. male in the ICU (#13) at [facility] for whom I am asked for consultation by Dr Reesa Chew (pts PCP and attending in ICU) for evaluation of a Rash. The patient's chart was reviewed.  Please see history and physical and daily notes in the epic EMR. The patient was admitted approximately 2 weeks ago and has history of alcoholism. He developed a rash for which I saw photos of from July 8 and also subsequently. In evaluation today, the rash appears to be much improved and his nurse agrees.  She said he also developed a petechial-like rash on his abdomen associated with his blood transfusion in the last 2 days.  This is also essentially resolved.  The following portions of the chart were reviewed this encounter and updated as appropriate:  Tobacco  Allergies  Meds  Problems  Med Hx  Surg Hx  Fam Hx     Review of Systems:  No other skin or systemic complaints except as noted in HPI or Assessment and Plan.  Objective  Well appearing patient in no apparent distress; mood and affect are within normal limits. The patient's rash is much improved and previous photos from 05/17/20 and later.  The previous rash and photographs had some appearance that may have been consistent with erythema multiforme with targetoid-like lesions. Today he has some residual oval pink macules on his lower legs.  His arms chest and abdomen are essentially clear.  There is no rash on his face and his mucous membranes appear clear.  A focused examination was performed including the face chest extremities abdomen and mucous membranes B/L leg. Relevant physical exam findings are noted in the Assessment and Plan.  Assessment & Plan    Rash in a very sick individual with alcoholism in the ICU for approximately 2 weeks. Comprehensive History and Exam performed.  R pretibial  Erythema multiforme vs hypersensitivity reaction vs other   Skin / nail biopsy - R pretibial Type  of biopsy: punch   Informed consent: discussed and consent obtained   Timeout: patient name, date of birth, surgical site, and procedure verified   Procedure prep:  Patient was prepped and draped in usual sterile fashion (the patient was cleaned and prepped) Prep type:  Isopropyl alcohol Anesthesia: the lesion was anesthetized in a standard fashion   Anesthetic:  1% lidocaine w/ epinephrine 1-100,000 buffered w/ 8.4% NaHCO3 Punch size:  4 mm Suture size:  4-0 Suture type: nylon   Hemostasis achieved with: suture, pressure and aluminum chloride   Outcome: patient tolerated procedure well   Post-procedure details: sterile dressing applied and wound care instructions given   Dressing type: bandage, petrolatum and pressure dressing    Specimen 1 - Surgical pathology Differential Diagnosis: D48.5 r/o erythema multiforme vs hypersensitivity reaction vs other  Check Margins: No  Wound care with topical antibiotic and dressing.  May remove suture after 7 days.  No follow-ups on file.   Documentation: I have reviewed the above documentation for accuracy and completeness, and I agree with the above.  Sarina Ser, MD

## 2020-05-23 NOTE — Progress Notes (Signed)
Wyline Mood , MD [address], [address], [place], [place], [address] [address], [place], [place], [address] Phone: [phone]  Fax: [phone]   Shane Kotch Sr. is being followed for follow up for anemia    Subjective: Looks much better, awake and not in any pain or distress    Objective: Vital signs in last 24 hours: Vitals:   05/23/20 0400 05/23/20 0500 05/23/20 0600 05/23/20 0700  BP: 127/74 119/81 125/82   Pulse: (!) 110 (!) 108 (!) 110   Resp: (!) 23 19 19    Temp: 98 F (36.7 C)   98.1 F (36.7 C)  TempSrc: Oral   Oral  SpO2: 90% 91% 95%   Weight:      Height:       Weight change:   Intake/Output Summary (Last 24 hours) at 05/23/2020 1132 Last data filed at 05/23/2020 0500 Gross per 24 hour  Intake 503.97 ml  Output 3200 ml  Net -2696.03 ml     Exam:  Abdomen: distended , no tenderness, no guarding or rigidity , BS+  Lab Results: @LABTEST2 @ Micro Results: Recent Results (from the past 240 hour(s))  CULTURE, BLOOD (ROUTINE X 2) w Reflex to ID Panel     Status: None   Collection Time: 05/15/20 11:52 PM   Specimen: Left Antecubital; Blood  Result Value Ref Range Status   Specimen Description LEFT ANTECUBITAL  Final   Special Requests   Final    BOTTLES DRAWN AEROBIC AND ANAEROBIC Blood Culture adequate volume   Culture   Final    NO GROWTH 5 DAYS Performed at [facility], [address] [place], [address] [place]    Report Status 05/21/2020 FINAL  Final  CULTURE, BLOOD (ROUTINE X 2) w Reflex to ID Panel     Status: None   Collection Time: 05/15/20 11:53 PM   Specimen: BLOOD LEFT HAND  Result Value Ref Range Status   Specimen Description BLOOD LEFT HAND  Final   Special Requests   Final    BOTTLES DRAWN AEROBIC AND ANAEROBIC Blood Culture adequate volume   Culture   Final    NO GROWTH 5 DAYS Performed at [facility], [address] [place], [address] [place]    Report Status 05/21/2020 FINAL  Final   MRSA PCR Screening     Status: None   Collection Time: 05/16/20 12:24 AM   Specimen: Nasopharyngeal  Result Value Ref Range Status   MRSA by PCR NEGATIVE NEGATIVE Final    Comment:        The GeneXpert MRSA Assay (FDA approved for NASAL specimens only), is one component of a comprehensive MRSA colonization surveillance program. It is not intended to diagnose MRSA infection nor to guide or monitor treatment for MRSA infections. Performed at [facility], [address], [place], [address] [place]   Urine Culture     Status: None   Collection Time: 05/16/20  3:27 AM   Specimen: Urine, Random  Result Value Ref Range Status   Specimen Description   Final    URINE, RANDOM Performed at [facility], [address], [place], [address] [place]    Special Requests   Final    NONE Performed at [facility], [address] [place], [address] [place]    Culture   Final    NO GROWTH Performed at [facility], [address] [id number], [place], [address] [place]    Report Status 05/17/2020 FINAL  Final  Gastrointestinal Panel by PCR , Stool     Status: None   Collection Time: 05/17/20  3:06 PM   Specimen: Stool  Result Value Ref Range Status   Campylobacter species NOT DETECTED NOT DETECTED Final   Plesimonas shigelloides NOT DETECTED NOT DETECTED Final   Salmonella species NOT DETECTED NOT DETECTED Final   Yersinia enterocolitica NOT DETECTED NOT DETECTED Final   Vibrio species NOT DETECTED NOT DETECTED Final   Vibrio cholerae NOT DETECTED NOT DETECTED Final   Enteroaggregative E coli (EAEC) NOT DETECTED NOT DETECTED Final   Enteropathogenic E coli (EPEC) NOT DETECTED NOT DETECTED Final   Enterotoxigenic E coli (ETEC) NOT DETECTED NOT DETECTED Final   Shiga like toxin producing E coli (STEC) NOT DETECTED NOT DETECTED Final   Shigella/Enteroinvasive E coli (EIEC) NOT DETECTED NOT DETECTED Final   Cryptosporidium NOT DETECTED NOT DETECTED Final    Cyclospora cayetanensis NOT DETECTED NOT DETECTED Final   Entamoeba histolytica NOT DETECTED NOT DETECTED Final   Giardia lamblia NOT DETECTED NOT DETECTED Final   Adenovirus F40/41 NOT DETECTED NOT DETECTED Final   Astrovirus NOT DETECTED NOT DETECTED Final   Norovirus GI/GII NOT DETECTED NOT DETECTED Final   Rotavirus A NOT DETECTED NOT DETECTED Final   Sapovirus (I, II, IV, and V) NOT DETECTED NOT DETECTED Final    Comment: Performed at [facility], [address] [place], [place] [address]  C Difficile Quick Screen (NO PCR Reflex)     Status: None   Collection Time: 05/17/20  3:06 PM   Specimen: STOOL  Result Value Ref Range Status   C Diff antigen NEGATIVE NEGATIVE Final   C Diff toxin NEGATIVE NEGATIVE Final   C Diff interpretation No C. difficile detected.  Final    Comment: Performed at [facility], [address] [place], [place] [address]  Aerobic Culture (superficial specimen)     Status: None (Preliminary result)   Collection Time: 05/21/20  2:30 PM   Specimen: Wound  Result Value Ref Range Status   Specimen Description   Final    WOUND Performed at [facility], [address] [place], [place] [address]    Special Requests   Final    NONE Performed at [facility], [address] [place], [place] [address]    Gram Stain NO WBC SEEN NO ORGANISMS SEEN   Final   Culture   Final    NO GROWTH 2 DAYS Performed at [facility], [address] [place], [place] [address]    Report Status PENDING  Incomplete   Studies/Results: CT ABDOMEN PELVIS WO CONTRAST  Result Date: 05/22/2020 CLINICAL DATA:  Anemia EXAM: CT ABDOMEN AND PELVIS WITHOUT CONTRAST TECHNIQUE: Multidetector CT imaging of the abdomen and pelvis was performed following the standard protocol without IV contrast. COMPARISON:  05/16/2020 FINDINGS: Lower chest: Moderate bilateral pleural effusions, left greater than right, with compressive atelectasis. Findings  are stable. Hepatobiliary: No focal hepatic abnormality. Gallbladder unremarkable. Pancreas: Pancreatic head cyst again noted, 2.5 cm. Peripancreatic edema again noted, similar to prior study. Small pancreatic body cyst measures 11 mm, stable. 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 Spleen: No focal abnormality.  Normal size. Adrenals/Urinary Tract: No adrenal abnormality. No focal renal abnormality. No stones or hydronephrosis. Urinary bladder is unremarkable. Foley catheter present within the bladder. Stomach/Bowel: Stomach, large and small bowel grossly unremarkable. Previously seen rectal tube no longer visualized. Vascular/Lymphatic: No evidence of aneurysm or adenopathy. Reproductive: No  visible focal abnormality. Other: Moderate ascites in the abdomen and pelvis.  No change. Musculoskeletal: No acute bony abnormality. Healing left lateral rib fractures. Diffuse anasarca again noted, stable. IMPRESSION: Moderate bilateral pleural effusions, left larger than right with compressive atelectasis. Stable. Stable ascites throughout the abdomen and pelvis and anasarca changes. Peripancreatic edema again noted with pseudo cysts in the pancreatic body and head/uncinate process. No significant change since prior study. Overall, exam is stable since recent study. Electronically Signed   By: Charlett Nose M.D.   On: 05/22/2020 12:03   DG Chest Port 1 View  Result Date: 05/22/2020 CLINICAL DATA:  Chest pain. EXAM: PORTABLE CHEST 1 VIEW COMPARISON:  May 15, 2020. FINDINGS: The heart size and mediastinal contours are within normal limits. Right-sided PICC line is noted with distal tip in expected position of the SVC. No pneumothorax is noted. Mild to moderate left pleural effusion is noted with increased left basilar atelectasis or infiltrate. Minimal right pleural effusion is noted with minimal right basilar subsegmental atelectasis. Mild central pulmonary vascular congestion is noted. The  visualized skeletal structures are unremarkable. IMPRESSION: Mild to moderate left pleural effusion is noted with increased left basilar atelectasis or infiltrate. Minimal right pleural effusion is noted with minimal right basilar subsegmental atelectasis. Mild central pulmonary vascular congestion is noted. Electronically Signed   By: Lupita Raider M.D.   On: 05/22/2020 08:57   Medications: I have reviewed the patient's current medications. Scheduled Meds: . Chlorhexidine Gluconate Cloth  6 each Topical Daily  . dronabinol  2.5 mg Oral BID AC  . enoxaparin (LOVENOX) injection  70 mg Subcutaneous Q12H  . feeding supplement (ENSURE ENLIVE)  237 mL Oral TID BM  . folic acid  1 mg Intravenous Daily  . Gerhardt's butt cream   Topical TID  . insulin aspart  0-9 Units Subcutaneous Q6H  . metoprolol tartrate  12.5 mg Oral BID  . pantoprazole (PROTONIX) IV  40 mg Intravenous Q12H  . sodium chloride flush  3 mL Intravenous Q12H  . Vitamin D (Ergocalciferol)  50,000 Units Oral Q7 days   Continuous Infusions: . sodium chloride Stopped (05/13/20 1628)  . meropenem (MERREM) IV 1 g (05/23/20 0830)  . TPN ADULT (ION) 85 mL/hr at 05/22/20 1716  . TPN ADULT (ION)     PRN Meds:.sodium chloride, hydrALAZINE, HYDROmorphone (DILAUDID) injection, labetalol, magic mouthwash **AND** lidocaine, loperamide, LORazepam, ondansetron **OR** ondansetron (ZOFRAN) IV   Assessment: Principal Problem:   Acute alcoholic pancreatitis Active Problems:   Alcohol withdrawal seizure with delirium (HCC)   Hyponatremia   Hypokalemia   AKI (acute kidney injury) (HCC)   Elevated CK   Heme positive stool   Thrombocytopenia (HCC)   Acute metabolic encephalopathy   Severe malnutrition (HCC)   Sinus tachycardia   Ascites   Hypotension   Protein-calorie malnutrition, severe  Shane Sergeant Sr. 34 y.o. male with history of alcohol abuse admitted with acute pancreatitis , pseudocysts, ascites , seizures , dural venous sinus  thrombosis , severe calorie malnutrition . I have been asked to see if he has been having any GI blood losses. I have spoken to the nurses and has not had a bowel movement for a few days   With no overt blood loss seen am less concerned for a GI hemorrhage . Consider other areas of blood loss, intra abdominal ? Plan: 1. Monitor color of the stool- if black or red then suggests blood. Do not order or interpret a stool occult test which is  a test for colon cancer screening in the outpatient and not useful to rule in or out a GI bleed.   2. Discussed with Dr Nelson Chimes - consider diagnostic paracentesis to look for blood in ascites  3. Presently no indication for any endoscopy       LOS: 20 days   Wyline Mood, MD 05/23/2020, 11:32 AM

## 2020-05-23 NOTE — Procedures (Signed)
Pre Procedural Dx: Symptomatic Ascites Post Procedural Dx: Same  Successful US guided paracentesis yielding 600 cc of serous, non-bloody, ascitic fluid. Sample sent to laboratory as requested.  EBL: None  Complications: None immediate  Katherina Right, MD Pager #: [phone]

## 2020-05-23 NOTE — Progress Notes (Signed)
CH visited pt. briefly as follow-up from prior visits; pt. resting in bed watching movie on TV; per S.O. at bedside, pt. just received some medication that has made him drowsy.  No needs vocalized at this time; S.O. says pt. has 'had a better day today' compared to yesterday.  CH remains available as needed.    05/23/20 1800  Clinical Encounter Type  Visited With Patient and family together  Visit Type Follow-up

## 2020-05-23 NOTE — Consult Note (Addendum)
PHARMACY - TOTAL PARENTERAL NUTRITION CONSULT NOTE   Indication: severe malnutrition  Patient Measurements: Height: 5\' 7"  (170.2 cm) Weight: 68.2 kg (150 lb 5.7 oz) IBW/kg (Calculated) : 66.1 TPN AdjBW (KG): 68.3 Body mass index is 23.55 kg/m.  Assessment: 34 year old male with PMHx of EtOH abuse admitted with alcohol withdrawal, acute alcoholic pancreatitis, acute metabolic encephalopathy, ascites s/p paracentesis on 6/28 with 2.8 L fluid removed, chest pain, acute renal failure.   Glucose / Insulin: sSSI q6h  CBG 88 - 134 (required 3 units SSI) Electrolytes: Sodium 134 today from 132 yesterday; improved. Patient received IV Lasix yesterday. Continue to monitor Trending up: sodium Trending down: phosphorous, magnesium potassium Renal: AKI resolved. Scr 0.52 today LFTs / TGs: AST/ALT: 43/20 U/L at baseline to 138/105 on 7/13 and improved to 79/87 today Prealbumin / albumin: albumin 1.4 g/dL (stable) Intake / Output; MIVF: 819 in (TPN was held part of day yesterday due to hypervolemia) / 7200 out yesterday, no MIVF GI Imaging: none since TPN began Surgeries / Procedures:  7/7: US-guided paracentesis: 3L removed  Central access: 05/16/20 TPN start date: 05/16/20  Nutritional Goals (per RD recommendation on 05/16/20): kCal: 1900 - 2200 / day, Protein: 95 - 105 grams/day, Fluid: 1900 - 2200 mL/day Goal TPN rate is 85 mL/hr (provides 105 g of protein and 2200 kcals per day)  Current Nutrition:  Regular Diet (remains too lethargic to consume with a continued poor appetite). Dronabinol started 7/13 for appetite stimulation. Ensure feeding supplements TID ordered 7/14. Plan is for 48 hour calorie count.  Plan:   Decrease TPN to 75% of goal rate to 63 mL/hr at 1800  AA 51 g/L, dextrose 16%, lipids 32 g/L, 1512 mL total volume  Electrolytes in TPN  Cl:Ac 1:1,  Na: 100, K 30, Ca 5, Mg 15, phosphorous 5 (all mEq/L)  No changes today  Add standard MVI, 100 mg thiamine, 1 mg  folic acid and trace elements to TPN  continue Sensitive q6h SSI and adjust as needed   Monitor TPN labs daily until stable due to high risk for refeeding syndrome  8/14 05/23/2020,9:28 AM

## 2020-05-23 NOTE — Progress Notes (Signed)
Daily Progress Note   Patient Name: Shane Holmer Sr.       Date: 05/23/2020 DOB: [birth year]  Age: 34 y.o. MRN#: [id number] Attending Physician: Arnetha Courser, MD Primary Care Physician: [name], MD Admit Date: 05/03/2020  Reason for Consultation/Follow-up: Establishing goals of care  Subjective: Patient is resting in bed. He is confused, and patient is just returning from head CT. He states he wants to do everything possible to try to live. Later, he also states he can only withstand so much.  He has minimal oral intake. His fiance states he is worried someone will take their child away from her. She states he has been seeing things crawling on the walls.   He was recently started on Marinol. We discussed increasing oral intake. He states he would not want a feeding tube.    Length of Stay: 20  Current Medications: Scheduled Meds:  . sodium chloride   Intravenous Once  . Chlorhexidine Gluconate Cloth  6 each Topical Daily  . dronabinol  2.5 mg Oral BID AC  . enoxaparin (LOVENOX) injection  70 mg Subcutaneous Q12H  . feeding supplement (ENSURE ENLIVE)  237 mL Oral TID BM  . folic acid  1 mg Intravenous Daily  . furosemide  20 mg Intravenous Once  . Gerhardt's butt cream   Topical TID  . insulin aspart  0-9 Units Subcutaneous Q6H  . methylPREDNISolone (SOLU-MEDROL) injection  80 mg Intravenous Daily  . metoprolol tartrate  12.5 mg Oral BID  . pantoprazole (PROTONIX) IV  40 mg Intravenous Q12H  . sodium chloride flush  3 mL Intravenous Q12H  . Vitamin D (Ergocalciferol)  50,000 Units Oral Q7 days    Continuous Infusions: . sodium chloride Stopped (05/13/20 1628)  . meropenem (MERREM) IV 1 g (05/23/20 0830)  . TPN ADULT (ION) 85 mL/hr at 05/22/20 1716  . TPN ADULT (ION)       PRN Meds: sodium chloride, hydrALAZINE, HYDROmorphone (DILAUDID) injection, labetalol, magic mouthwash **AND** lidocaine, loperamide, LORazepam, ondansetron **OR** ondansetron (ZOFRAN) IV  Physical Exam Pulmonary:     Effort: Pulmonary effort is normal.  Neurological:     Mental Status: He is alert.             Vital Signs: BP 98/66 (BP Location: Left Arm)   Pulse (!) 110  Temp 99.4 F (37.4 C) (Oral)   Resp 19   Ht 5\' 7"  (1.702 m)   Wt 68.2 kg   SpO2 95%   BMI 23.55 kg/m  SpO2: SpO2: 95 % O2 Device: O2 Device: Room Air O2 Flow Rate:    Intake/output summary:   Intake/Output Summary (Last 24 hours) at 05/23/2020 1555 Last data filed at 05/23/2020 0500 Gross per 24 hour  Intake 503.97 ml  Output 3200 ml  Net -2696.03 ml   LBM: Last BM Date: 05/20/20 Baseline Weight: Weight: 63.5 kg Most recent weight: Weight: 68.2 kg       Palliative Assessment/Data:      Patient Active Problem List   Diagnosis Date Noted  . Protein-calorie malnutrition, severe 05/22/2020  . Hypotension   . Ascites   . Acute alcoholic pancreatitis 05/10/2020  . Acute metabolic encephalopathy 05/10/2020  . Severe malnutrition (HCC) 05/10/2020  . Sinus tachycardia 05/10/2020  . Alcohol withdrawal seizure with delirium (HCC) 05/03/2020  . Hyponatremia 05/03/2020  . Hypokalemia 05/03/2020  . AKI (acute kidney injury) (HCC) 05/03/2020  . Elevated CK 05/03/2020  . Heme positive stool 05/03/2020  . Thrombocytopenia (HCC) 05/03/2020    Palliative Care Assessment & Plan   Recommendations/Plan: Full scope care.    Code Status:    Code Status Orders  (From admission, onward)         Start     Ordered   05/17/20 1603  Do not attempt resuscitation (DNR)  Continuous       Question Answer Comment  In the event of cardiac or respiratory ARREST Do not call a "code blue"   In the event of cardiac or respiratory ARREST Do not perform Intubation, CPR, defibrillation or ACLS   In the  event of cardiac or respiratory ARREST Use medication by any route, position, wound care, and other measures to relive pain and suffering. May use oxygen, suction and manual treatment of airway obstruction as needed for comfort.   Comments MOST form on chart.      05/17/20 1602        Code Status History    Date Active Date Inactive Code Status Order ID Comments User Context   05/03/2020 2155 05/17/2020 1602 Full Code 07/18/2020  [id number], MD ED   05/03/2020 2016 05/03/2020 2155 Full Code 2156  [id number], MD ED   Advance Care Planning Activity      Prognosis: Poor overall.    Thank you for allowing the Palliative Medicine Team to assist in the care of this patient.   Total Time 35 min Prolonged Time Billed  no      Greater than 50%  of this time was spent counseling and coordinating care related to the above assessment and plan.  Willy Eddy, NP  Please contact Palliative Medicine Team phone at [phone] for questions and concerns.

## 2020-05-23 NOTE — Progress Notes (Signed)
PROGRESS NOTE    Shane Ewen Sr.  OZD:[id number] DOB: Jun 02, 1986 DOA: 05/03/2020 PCP: Shane Market, MD   Brief Narrative:  34 year old gentleman with no medical history, chronic alcoholism, has been drinking nonstop about 12 packs of beer daily since age of 30 presented to the ED on  05/03/2020 for evaluation of progressive confusion and lethargy. He also had episode of unresponsiveness in the triage area. As per patient's fianc, patient stopped drinking about 24 hours prior to arrival and started behaving altered, confused. Also reported multiple episodes diarrhea, but no nausea or vomiting.  In ED he had 1 witnessed seizure. CT abdomen with concern of pancreatitis with elevated lipase, bilateral pleural effusions and ascites.  He was initially admitted for management of acute alcohol withdrawal and alcoholic pancreatitis.  Patient has a very complicated course of illness, underwent alcohol withdrawal.  There was some concern of SBP.  He was evaluated by surgeons on 05/06/2020 for concern of acute compartment syndrome and abdomen and found to have some intra-abdominal hypertension which was managed conservatively by placing Foley catheter and NG tube.  Sentences with cell count of 822, neutrophils of 79%.  Lipase was elevated at 53,000, LDH 526 and amylase of 10,000.  Cultures were negative.  Consistent with pancreatic ascites.   Lipase continued to get worse initially with peaked at 1543, now trending down.  MRCP and repeat CT abdomens with some attenuations in pancreatic head and body, some improvement in edema.      Neurology was consulted on 05/10/2020 due to worsening altered mental status. 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  Patient did not receive any coronavirus [employer] vaccine.  He was initiated on heparin.  EEG showed generalized background slowing.  No epileptiform activity was noted. A  repeat MRI brain without contrast done on 05/15/2020 showed Residual abnormal diffusion restriction over the right posterior parietal lobe and right cerebellum, consistent with expected evolution of subacute venous infarcts.  It was started on Heparin by neurology.  On 05/16/2019 1 in the evening he became hypotensive and hypothermic with worsening leukocytosis and development of lactic acidosis.  He was transferred to ICU on 05/16/2020 due to the concern for developing septic shock.  He was started on pressors.  His lactate was 2 1 pro-Cal was 1.84 WBC was 40.4 and hemoglobin was 11.9.  CT abdomen done on 05/16/2020 showed peripancreatic edema with multiple pseudocyst.  The dominant pseudocyst has decreased to 2.2 cm.  There was extensive ascites with loculation around the liver right gutter and pelvis. Patient has been started on meropenem.  Although urine, blood and ascitic fluid cultures remain negative.  C. difficile colitis and GI pathogens were negative.  Plan is to continue meropenem for 7 days for concern of infected pseudocyst.  Patient did developed decreased in hemoglobin and there was some concern of GI bleed.  Patient and his wife did endorse some blood in his stool before coming to the hospital.  Patient has a strong family history of colon cancer where both of his parents died of colon cancer, mother in 29s and father in 22s.  GI is recommending outpatient work-up after this acute illness.   TPN was started on 05/17/2020 due to poor p.o. intake.  Subjective: Patient was more responsive this morning.  Wife is in the room.  He did eat some ice cream and juice this morning.  Denies any new complaints.  Assessment & Plan:  Principal Problem:   Acute alcoholic pancreatitis Active Problems:   Alcohol withdrawal seizure with delirium (HCC)   Hyponatremia   Hypokalemia   AKI (acute kidney injury) ([place])   Elevated CK   Heme positive stool   Thrombocytopenia (HCC)   Acute metabolic  encephalopathy   Severe malnutrition (HCC)   Sinus tachycardia   Ascites   Hypotension   Protein-calorie malnutrition, severe  Sepsis with septic shock.  Resolved  Transferred to ICU on 05/15/2020 due to hypothermia and hypotension requiring Levophed.  Now off the Levophed and maintaining his blood pressure.  ID was also consulted and they started him on meropenem for concern of infected pseudocyst. Remained afebrile over the last 24-hour, before that having some low-grade fever.  Leukocytosis with some improvement but procalcitonin at 1.34 today. -Continue meropenem for total of 7 days.  Day 6 today.  Blood transfusion reaction.  Patient did develop some rash and become short of breath with some pulmonary vascular congestion yesterday while getting blood transfusion. -Patient needs another transfusion we will try Benadryl and Lasix with it.  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   CT venogram on 7/3 and a repeat on 7/5 with persistent thrombosis at the confluence of sinuses and left transverse dural venous sinus.  Neurology is following and they start him on heparin infusion.  Patient also had acute right cerebellum and a possible old infarcts involving parietal lobes and left occipital region, seen on MRI done on 05/12/2020.  No hypercoagulable work-up. Due to his worsening lower extremity rash which is also involving some upper extremities there is some concern of vasculitis.  Patient also has some rash/thrush in his mouth. His differential is broad at this time which includes rheumatologic disorder/vasculitis/Behcet's syndrome/malignancy. Elevated ESR and CRP.  CK has been normalized. Dr. Jefm Bryant from rheumatology saw the patient and agrees about his confusing picture as he is not fitting on any definite thing.  His rash and CVT can be explained with Behcet's syndrome but that is a clinical diagnosis.  No oral or genital  ulceration.  No vision or eye changes.  Vasculitis or panniculitis can be another possibility.  We asked dermatology to do a skin biopsy which was done this morning. -Patient was started on Solu-Medrol 80 mg daily after the skin biopsy according to the advice of Dr. Jefm Bryant to see his response for next couple of days. -RPR and hepatitis panel negative. -ANCA titers negative. -Cardiolipin antibodies negative. -Antithrombin III activity reduced at 59 with reduced AC Ch[place] at 30.  Which is more consistent with increased consumption as patient has active thrombosis.  He will need a repeat testing after acute illness and needs a follow-up with hematologist as an outpatient. -Protein S levels within normal limit. -CA 1919 mildly elevated at 81-which is very nonspecific. -Vitamin D level reduced at 7.39-we started him on repletion. -Folic acid was reduced-started him on supplement. -Rest of the labs which include protein C, factor V Leyden, prothrombin gene mutation, lupus anticoagulant profile, copper, zinc, vitamin C, AA and B1 levels are pending.  Erythematous skin rash.  There is some concern of vasculitis.  Less likely hood of cellulitis as it is present on all extremities.  Patient developed a small morbilliform rash on his belly yesterday, rash was noted during blood transfusion.  Less likely to be secondary to blood transfusion reaction. -Skin biopsy was done by dermatology this morning-awaiting pathology results.  Thrombocytopenia.  Stable with some improvement in platelets at 88.  HIT antibodies are negative. Switch heparin with therapeutic Lovenox.  Patient will need long-term anticoagulation once able to take p.o. intake.    Anemia.  Hemoglobin at 6.9.  Patient received partial unit yesterday which was stopped earlier due to concern of transfusion reaction.  Received 2 unit of PRBC on 05/18/2020.  Per wife he did had some bleeding per rectum at home.  Not aware of any obvious bleeding while in  the hospital.  Patient is critically ill also.  Had some mild hematuria on UA done on 05/16/2020.  No gross hematuria. -Check anemia panel-iron studies consistent with anemia of chronic disease.  Low folic acid, normal R51. -Start him on folic acid supplement. -Continue to monitor and transfuse below 7. -Check FOBT-still pending. -Ordered 1 more unit of PRBC-we will give Benadryl and Lasix with it. -Ordered paracentesis to rule out any blood. -CT abdomen done yesterday was without any retroperitoneal bleed.  Chronic pancreatitis with pseudocyst formation.  Present on admission.  CT abdomen without any gallbladder abnormalities but did had multiple cyst in pancreatic head and body.  Lipase peaked at 1543, now trending down. -CT abdomen was repeated 7/13 due to concern of retroperitoneal bleeding.  It shows stable pancreatitis changes with pseudocyst.  Stable ascites and pleural effusion. -Continue supportive care.  Severe protein calorie malnutrition.  POA, most likely secondary to chronic alcohol abuse.  Patient does not want enteric feeding with NG tube.  Currently on TPN since 05/17/2020. BMI of 23.58 . -Continue monitoring daily CMP, magnesium, phosphorous. -Encourage p.o. intake. -We will try weaning him off from TPN once started taking p.o.  Transaminitis.  Started improving. He is on TPN and also has an history of alcohol abuse. -Continue to monitor.  TSH elevation.  T4 normal.  No acute concern at this time.  Diarrhea.  No bowel movement for the past 3 days.  Sinus tachycardia.  No obvious cause.  Tachycardia persist off the pressors.  Denies any pain.  Past alcohol withdrawal.  Cultures negative. Echo normal.  Heart rate remained in low 100 despite increasing the dose of metoprolol to 25 mg twice daily yesterday. -Increase the dose of metoprolol to 50 mg twice daily.  Ascites.  Paracentesis labs are consistent with pancreatic ascites.  CT abdomen with mild fatty infiltrate, no  cirrhosis.  Patient underwent paracentesis twice 1 on 05/07/2020 and second on 05/16/2020.  Culture remain negative.  Repeat CT abdomen today with stable ascites. -Repeat paracentesis to see any blood and we will also repeat cultures.  Pressure injuries.  High risk due to severe protein calorie malnutrition and inability to move.  Getting some unstageable breakdown on his sacrum and bilateral heels. -Continue to monitor. -Frequent position changes.  AKI.  Resolved Patient had AKI on admission.  Most likely prerenal as creatinine normalized with IV hydration. -Continue to monitor.  Electrolyte abnormalities.  Patient had hypervolemic hyponatremia which was present on admission and has been resolved.  He also developed some hypokalemia which has been resolved at this time.  He is high risk for electrolyte abnormalities as he is on TPN.  Also concern of refeeding syndrome. -Continue to monitor BMP, magnesium and phosphate.  Depression.  Patient appears depressed.  Per wife he is feeling very down since the death of his father and drinking a lot in order to overcome his depression. -Psychiatry consult-was consulted many days ago, no notes yet.  Palliative care consult.  Palliative care was consulted to discuss goals of care.  Patient and his wife  will continue aggressive measures and to treat treatables.  Code was changed to DNR.  Patient is critically ill with multiorgan involvement and is very high risk for deterioration and death.   Objective: Vitals:   May 25, 2020 0400 05/25/2020 0500 May 25, 2020 0600 25-May-2020 0700  BP: 127/74 119/81 125/82   Pulse: (!) 110 (!) 108 (!) 110   Resp: (!) '23 19 19   ' Temp: 98 F (36.7 C)   98.1 F (36.7 C)  TempSrc: Oral   Oral  SpO2: 90% 91% 95%   Weight:      Height:        Intake/Output Summary (Last 24 hours) at 05/25/20 1305 Last data filed at 05/25/20 0500 Gross per 24 hour  Intake 503.97 ml  Output 3200 ml  Net -2696.03 ml   Filed Weights    05/03/20 2232 05/16/20 0030 05/22/20 0159  Weight: 63.5 kg 68.3 kg 68.2 kg    Examination:  General exam: Chronically ill-appearing gentleman, appears calm and comfortable. Respiratory system: Clear bilaterally, respiratory effort normal. Cardiovascular system: S1 & S2 heard, RRR. Gastrointestinal system: Soft, nontender, mildly distended, bowel sounds positive. Central nervous system: Alert and oriented. No focal neurological deficits. Extremities: 2+ LE edema, no cyanosis, pulses intact and symmetrical. Skin: Erythematous rash involving bilateral lower extremities and upper extremities.  With new morbilliform rash on abdomen.  Rash seems improving, lower extremity rash with some central grayish area. Psychiatry: Judgement and insight appear normal.   DVT prophylaxis: Heparin Code Status: DNR Family Communication: Discussed with wife at bedside. Disposition Plan:  Status is: Inpatient  Remains inpatient appropriate because:Inpatient level of care appropriate due to severity of illness   Dispo: The patient is from: Home              Anticipated d/c is to: To be determined              Anticipated d/c date is: > 3 days              Patient currently is not medically stable to d/c.  Consultants:   Neurology  Gastroenterology  General surgery  Palliative care  PCCM  Rheumatology  Dermatology  Procedures:  Antimicrobials:  Meropenem  Data Reviewed: I have personally reviewed following labs and imaging studies  CBC: Recent Labs  Lab 05/19/20 0214 05/19/20 0214 05/20/20 0439 05/21/20 0434 05/22/20 0155 2020/05/25 0458 2020-05-25 1229  WBC 16.9*  --  15.9* 15.7* 19.0* 15.6*  --   NEUTROABS  --   --   --  11.9*  --   --   --   HGB 9.7*  --  8.1* 7.6* 6.8* 6.9*  --   HCT 28.0*   < > 24.3* 23.0* 20.7* 20.7* 20.8*  MCV 90.3  --  91.7 92.4 94.1 92.8  --   PLT 94*  --  77* 79* 85* 88*  --    < > = values in this interval not displayed.   Basic Metabolic  Panel: Recent Labs  Lab 05/19/20 0214 05/20/20 0439 05/21/20 0434 05/22/20 0155 25-May-2020 0458  NA 133*  132* 137 137 132* 134*  K 4.6  4.5 4.9 5.0 4.7 4.5  CL 101  101 103 103 98 95*  CO2 '25  25 28 28 27 27  ' GLUCOSE 145*  145* 149* 133* 131* 131*  BUN '13  13 14 18 18 19  ' CREATININE 0.69  0.72 0.58* 0.57* 0.54* 0.52*  CALCIUM 7.7*  7.6* 8.1* 8.3* 8.1* 8.3*  MG  1.9 1.7 1.7 1.8 1.7  PHOS 3.4 3.9 4.7* 4.9* 4.6   GFR: Estimated Creatinine Clearance: 121.6 mL/min (A) (by C-G formula based on SCr of 0.52 mg/dL (L)). Liver Function Tests: Recent Labs  Lab 05/17/20 0535 05/17/20 0535 05/18/20 0517 05/18/20 0517 05/19/20 0214 05/20/20 0439 05/21/20 0434 05/22/20 0155 05/23/20 0458  AST 43*  --  36  --   --   --  115* 138* 79*  ALT 20  --  18  --   --   --  61* 105* 87*  ALKPHOS 99  --  86  --   --   --  188* 169* 150*  BILITOT 0.6  --  0.4  --   --   --  0.5 0.6 0.5  PROT 4.3*  --  4.1*  --   --   --  4.6* 4.7* 4.9*  ALBUMIN 1.8*   < > 1.5*   < > 1.6* 1.4* 1.4* 1.4* 1.4*   < > = values in this interval not displayed.   Recent Labs  Lab 05/17/20 0535 05/18/20 1830 05/21/20 1454  LIPASE 337* 171* 241*   No results for input(s): AMMONIA in the last 168 hours. Coagulation Profile: No results for input(s): INR, PROTIME in the last 168 hours. Cardiac Enzymes: Recent Labs  Lab 05/21/20 1454 05/22/20 0155  CKTOTAL 30* 23*   BNP (last 3 results) No results for input(s): PROBNP in the last 8760 hours. HbA1C: No results for input(s): HGBA1C in the last 72 hours. CBG: Recent Labs  Lab 05/22/20 0501 05/22/20 1140 05/22/20 2111 05/23/20 0011 05/23/20 0508  GLUCAP 134* 88 133* 143* 129*   Lipid Profile: Recent Labs    05/21/20 0434  TRIG 66   Thyroid Function Tests: No results for input(s): TSH, T4TOTAL, FREET4, T3FREE, THYROIDAB in the last 72 hours. Anemia Panel: Recent Labs    05/21/20 0434 05/21/20 1219  VITAMINB12  --  830  FOLATE 5.6*  --    FERRITIN 971*  --   TIBC 116*  --   IRON 17*  --   RETICCTPCT  --  2.3   Sepsis Labs: Recent Labs  Lab 05/18/20 0517 05/21/20 0434 05/22/20 0155 05/23/20 0458  PROCALCITON 0.84 1.03 1.24 1.34    Recent Results (from the past 240 hour(s))  CULTURE, BLOOD (ROUTINE X 2) w Reflex to ID Panel     Status: None   Collection Time: 05/15/20 11:52 PM   Specimen: Left Antecubital; Blood  Result Value Ref Range Status   Specimen Description LEFT ANTECUBITAL  Final   Special Requests   Final    BOTTLES DRAWN AEROBIC AND ANAEROBIC Blood Culture adequate volume   Culture   Final    NO GROWTH 5 DAYS Performed at [facility] [address] [place], [place] [address]    Report Status 05/21/2020 FINAL  Final  CULTURE, BLOOD (ROUTINE X 2) w Reflex to ID Panel     Status: None   Collection Time: 05/15/20 11:53 PM   Specimen: BLOOD LEFT HAND  Result Value Ref Range Status   Specimen Description BLOOD LEFT HAND  Final   Special Requests   Final    BOTTLES DRAWN AEROBIC AND ANAEROBIC Blood Culture adequate volume   Culture   Final    NO GROWTH 5 DAYS Performed at [facility], [address], [place], [place] [address]    Report Status 05/21/2020 FINAL  Final  MRSA PCR Screening  Status: None   Collection Time: 05/16/20 12:24 AM   Specimen: Nasopharyngeal  Result Value Ref Range Status   MRSA by PCR NEGATIVE NEGATIVE Final    Comment:        The GeneXpert MRSA Assay (FDA approved for NASAL specimens only), is one component of a comprehensive MRSA colonization surveillance program. It is not intended to diagnose MRSA infection nor to guide or monitor treatment for MRSA infections. Performed at [facility], [address], [place], [place] [address]   Urine Culture     Status: None   Collection Time: 05/16/20  3:27 AM   Specimen: Urine, Random  Result Value Ref Range Status   Specimen Description   Final    URINE, RANDOM Performed at  [facility], [address] [place], [place] [address]    Special Requests   Final    NONE Performed at [facility], [address] [place], [place] [address]    Culture   Final    NO GROWTH Performed at [facility], [place] [address] [place], [place] [address]    Report Status 05/17/2020 FINAL  Final  Gastrointestinal Panel by PCR , Stool     Status: None   Collection Time: 05/17/20  3:06 PM   Specimen: Stool  Result Value Ref Range Status   Campylobacter species NOT DETECTED NOT DETECTED Final   Plesimonas shigelloides NOT DETECTED NOT DETECTED Final   Salmonella species NOT DETECTED NOT DETECTED Final   Yersinia enterocolitica NOT DETECTED NOT DETECTED Final   Vibrio species NOT DETECTED NOT DETECTED Final   Vibrio cholerae NOT DETECTED NOT DETECTED Final   Enteroaggregative E coli (EAEC) NOT DETECTED NOT DETECTED Final   Enteropathogenic E coli (EPEC) NOT DETECTED NOT DETECTED Final   Enterotoxigenic E coli (ETEC) NOT DETECTED NOT DETECTED Final   Shiga like toxin producing E coli (STEC) NOT DETECTED NOT DETECTED Final   Shigella/Enteroinvasive E coli (EIEC) NOT DETECTED NOT DETECTED Final   Cryptosporidium NOT DETECTED NOT DETECTED Final   Cyclospora cayetanensis NOT DETECTED NOT DETECTED Final   Entamoeba histolytica NOT DETECTED NOT DETECTED Final   Giardia lamblia NOT DETECTED NOT DETECTED Final   Adenovirus F40/41 NOT DETECTED NOT DETECTED Final   Astrovirus NOT DETECTED NOT DETECTED Final   Norovirus GI/GII NOT DETECTED NOT DETECTED Final   Rotavirus A NOT DETECTED NOT DETECTED Final   Sapovirus (I, II, IV, and V) NOT DETECTED NOT DETECTED Final    Comment: Performed at [facility], [address] [place], [place] [address]  C Difficile Quick Screen (NO PCR Reflex)     Status: None   Collection Time: 05/17/20  3:06 PM   Specimen: STOOL  Result Value Ref Range Status   C Diff antigen NEGATIVE NEGATIVE Final   C Diff toxin  NEGATIVE NEGATIVE Final   C Diff interpretation No C. difficile detected.  Final    Comment: Performed at [facility], [address] [place], [place] [address]  Aerobic Culture (superficial specimen)     Status: None (Preliminary result)   Collection Time: 05/21/20  2:30 PM   Specimen: Wound  Result Value Ref Range Status   Specimen Description   Final    WOUND Performed at [facility], [address], [place], [place] [address]    Special Requests   Final    NONE Performed at [facility], [address], [place], [place] [address]    Gram Stain  NO WBC SEEN NO ORGANISMS SEEN   Final   Culture   Final    NO GROWTH 2 DAYS Performed at [facility], [place] [address] [place], [place] [address]    Report Status PENDING  Incomplete     Radiology Studies: CT ABDOMEN PELVIS WO CONTRAST  Result Date: 05/22/2020 CLINICAL DATA:  Anemia EXAM: CT ABDOMEN AND PELVIS WITHOUT CONTRAST TECHNIQUE: Multidetector CT imaging of the abdomen and pelvis was performed following the standard protocol without IV contrast. COMPARISON:  05/16/2020 FINDINGS: Lower chest: Moderate bilateral pleural effusions, left greater than right, with compressive atelectasis. Findings are stable. Hepatobiliary: No focal hepatic abnormality. Gallbladder unremarkable. Pancreas: Pancreatic head cyst again noted, 2.5 cm. Peripancreatic edema again noted, similar to prior study. Small pancreatic body cyst measures 11 mm, stable. 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 Spleen: No focal abnormality.  Normal size. Adrenals/Urinary Tract: No adrenal abnormality. No focal renal abnormality. No stones or hydronephrosis. Urinary bladder is unremarkable. Foley catheter present within the bladder. Stomach/Bowel: Stomach, large and small bowel grossly unremarkable. Previously seen rectal tube no longer visualized. Vascular/Lymphatic: No evidence of aneurysm or adenopathy.  Reproductive: No visible focal abnormality. Other: Moderate ascites in the abdomen and pelvis.  No change. Musculoskeletal: No acute bony abnormality. Healing left lateral rib fractures. Diffuse anasarca again noted, stable. IMPRESSION: Moderate bilateral pleural effusions, left larger than right with compressive atelectasis. Stable. Stable ascites throughout the abdomen and pelvis and anasarca changes. Peripancreatic edema again noted with pseudo cysts in the pancreatic body and head/uncinate process. No significant change since prior study. Overall, exam is stable since recent study. Electronically Signed   By: Rolm Baptise M.D.   On: 05/22/2020 12:03   DG Chest Port 1 View  Result Date: 05/22/2020 CLINICAL DATA:  Chest pain. EXAM: PORTABLE CHEST 1 VIEW COMPARISON:  May 15, 2020. FINDINGS: The heart size and mediastinal contours are within normal limits. Right-sided PICC line is noted with distal tip in expected position of the SVC. No pneumothorax is noted. Mild to moderate left pleural effusion is noted with increased left basilar atelectasis or infiltrate. Minimal right pleural effusion is noted with minimal right basilar subsegmental atelectasis. Mild central pulmonary vascular congestion is noted. The visualized skeletal structures are unremarkable. IMPRESSION: Mild to moderate left pleural effusion is noted with increased left basilar atelectasis or infiltrate. Minimal right pleural effusion is noted with minimal right basilar subsegmental atelectasis. Mild central pulmonary vascular congestion is noted. Electronically Signed   By: Marijo Conception M.D.   On: 05/22/2020 08:57    Scheduled Meds: . sodium chloride   Intravenous Once  . Chlorhexidine Gluconate Cloth  6 each Topical Daily  . dronabinol  2.5 mg Oral BID AC  . enoxaparin (LOVENOX) injection  70 mg Subcutaneous Q12H  . feeding supplement (ENSURE ENLIVE)  237 mL Oral TID BM  . folic acid  1 mg Intravenous Daily  . furosemide  20 mg  Intravenous Once  . Gerhardt's butt cream   Topical TID  . insulin aspart  0-9 Units Subcutaneous Q6H  . methylPREDNISolone (SOLU-MEDROL) injection  80 mg Intravenous Daily  . metoprolol tartrate  12.5 mg Oral BID  . pantoprazole (PROTONIX) IV  40 mg Intravenous Q12H  . sodium chloride flush  3 mL Intravenous Q12H  . Vitamin D (Ergocalciferol)  50,000 Units Oral Q7 days   Continuous Infusions: . sodium chloride Stopped (05/13/20 1628)  . meropenem (MERREM) IV 1 g (05/23/20 0830)  . TPN  ADULT (ION) 85 mL/hr at 05/22/20 1716  . TPN ADULT (ION)       LOS: 20 days   Time spent: 50 minutes.  More than 50% of that time was spent in direct patient care and discussing the plan with nursing staff and wife.  Lorella Nimrod, MD [facility]  If 7PM-7AM, please contact night-coverage [email or web address]  05/23/2020, 1:05 PM   This record has been created using Systems analyst. Errors have been sought and corrected,but may not always be located. Such creation errors do not reflect on the standard of care.

## 2020-05-23 NOTE — Progress Notes (Signed)
Nutrition Follow-up  DOCUMENTATION CODES:   Severe malnutrition in context of social or environmental circumstances  INTERVENTION:   TPN per pharmacy  Ensure Enlive po TID, each supplement provides 350 kcal and 20 grams of protein  Magic cup TID with meals, each supplement provides 290 kcal and 9 grams of protein  48hr calorie count   NUTRITION DIAGNOSIS:   Severe Malnutrition related to social / environmental circumstances (EtOH abuse) as evidenced by severe fat depletion, severe muscle depletion. Ongoing.  GOAL:   Patient will meet greater than or equal to 90% of their needs -met with TPN  MONITOR:   PO intake, Supplement acceptance, Labs, Weight trends, Skin, I & O's, TPN  ASSESSMENT:   34 year old male with PMHx of EtOH abuse admitted with alcohol withdrawal, acute alcoholic pancreatitis, acute metabolic encephalopathy, ascites s/p paracentesis on 6/28 with 2.8 L fluid removed, chest pain, acute renal failure.   Met with pt in room today. Pt hiding under blanket today and unwilling to speak with RD. Girlfriend at bedside reports pt ate some ice cream and drank some coke for breakfast this morning. Pt is also drinking several cartons of milk throughout the day. Pt tolerating TPN well at goal rate. 48hr calorie count ordered today to assess pt's oral intake. If pt is eating enough to meet at least 75% of his estimated needs, TPN can be discontinued. Pt is getting Marinol. Will decrease TPN to meet 75% of pt's estimated needs per MD request. Per chart, pt up ~10lbs from his admit weight but has remained weight stable over the past week. Refeed labs stable. RD will add Ensure and Magic Cup supplements to help pt meet his estimated needs.   Medications reviewed and include: marinol, lovenox, folic acid, insulin, protonix, vitamin D, meropenem  Labs reviewed: Na 134(L), K 4.5 wnl, P 4.6 wnl, Mg 1.7 wnl, alk phos 150(H), alb 1.4(L). AST 79(H), ALT 87(H) Vitamin D- 7.39(L) Wbc-  15.6(H), Hgb 6.9(L), Hct 20.7(L) cbgs- 134, 88, 133, 143, 129 x 24 hrs  Diet Order:   Diet Order            Diet regular Room service appropriate? Yes; Fluid consistency: Thin  Diet effective now                EDUCATION NEEDS:   No education needs have been identified at this time  Skin:  Skin Assessment: Reviewed RN Assessment  Last BM:  7/14- type 7  Height:   Ht Readings from Last 1 Encounters:  05/16/20 _0  (1.702 m)   Weight:   Wt Readings from Last 1 Encounters:  05/22/20 68.2 kg   Ideal Body Weight:  83.6 kg  BMI:  Body mass index is 23.55 kg/m.  Estimated Nutritional Needs:   Kcal:  1900-2200  Protein:  95-105 grams  Fluid:  1.9-2.2 L/day  Koleen Distance MS, RD, LDN Please refer to [facility] for RD and/or RD on-call/weekend/after hours pager

## 2020-05-24 ENCOUNTER — Inpatient Hospital Stay: Payer: Medicaid Other

## 2020-05-24 LAB — MAGNESIUM: Magnesium: 2.7 mg/dL — ABNORMAL HIGH (ref 1.7–2.4)

## 2020-05-24 LAB — CARDIOLIPIN ANTIBODIES, IGG, IGM, IGA
Anticardiolipin IgA: <9 APL U/mL (ref 0–11)
Anticardiolipin IgG: <9 GPL U/mL (ref 0–14)
Anticardiolipin IgM: <9 MPL U/mL (ref 0–12)

## 2020-05-24 LAB — PROTEIN, BODY FLUID (OTHER): Total Protein, Body Fluid Other: 2.4 g/dL

## 2020-05-24 LAB — COMPREHENSIVE METABOLIC PANEL
ALT: 83 U/L — ABNORMAL HIGH (ref 0–44)
AST: 80 U/L — ABNORMAL HIGH (ref 15–41)
Albumin: 1.5 g/dL — ABNORMAL LOW (ref 3.5–5.0)
Alkaline Phosphatase: 137 U/L — ABNORMAL HIGH (ref 38–126)
Anion gap: 8 (ref 5–15)
BUN: 18 mg/dL (ref 6–20)
CO2: 27 mmol/L (ref 22–32)
Calcium: 8.3 mg/dL — ABNORMAL LOW (ref 8.9–10.3)
Chloride: 93 mmol/L — ABNORMAL LOW (ref 98–111)
Creatinine, Ser: 0.51 mg/dL — ABNORMAL LOW (ref 0.61–1.24)
GFR calc Af Amer: >60 mL/min (ref >60)
GFR calc non Af Amer: >60 mL/min (ref >60)
Glucose, Bld: 939 mg/dL (ref 70–99)
Potassium: 5.9 mmol/L — ABNORMAL HIGH (ref 3.5–5.1)
Sodium: 128 mmol/L — ABNORMAL LOW (ref 135–145)
Total Bilirubin: 0.5 mg/dL (ref 0.3–1.2)
Total Protein: 4.7 g/dL — ABNORMAL LOW (ref 6.5–8.1)

## 2020-05-24 LAB — BPAM RBC
Blood Product Expiration Date: 202108062359
Blood Product Expiration Date: 202108092359
ISSUE DATE / TIME: 202107130624
ISSUE DATE / TIME: 202107142205
Unit Type and Rh: 6200
Unit Type and Rh: 6200

## 2020-05-24 LAB — TYPE AND SCREEN
ABO/RH(D): A POS
Antibody Screen: NEGATIVE
Unit division: 0
Unit division: 0

## 2020-05-24 LAB — CBC
HCT: 23.9 % — ABNORMAL LOW (ref 39.0–52.0)
Hemoglobin: 7.9 g/dL — ABNORMAL LOW (ref 13.0–17.0)
MCH: 32.4 pg (ref 26.0–34.0)
MCHC: 33.1 g/dL (ref 30.0–36.0)
MCV: 98 fL (ref 80.0–100.0)
Platelets: 130 10*3/uL — ABNORMAL LOW (ref 150–400)
RBC: 2.44 MIL/uL — ABNORMAL LOW (ref 4.22–5.81)
RDW: 16.3 % — ABNORMAL HIGH (ref 11.5–15.5)
WBC: 16.9 10*3/uL — ABNORMAL HIGH (ref 4.0–10.5)
nRBC: 0 % (ref 0.0–0.2)

## 2020-05-24 LAB — VITAMIN C: Vitamin C: 0.1 mg/dL — ABNORMAL LOW (ref 0.4–2.0)

## 2020-05-24 LAB — ZINC: Zinc: 49 ug/dL (ref 44–115)

## 2020-05-24 LAB — BASIC METABOLIC PANEL
Anion gap: 8 (ref 5–15)
BUN: 18 mg/dL (ref 6–20)
CO2: 30 mmol/L (ref 22–32)
Calcium: 8.2 mg/dL — ABNORMAL LOW (ref 8.9–10.3)
Chloride: 94 mmol/L — ABNORMAL LOW (ref 98–111)
Creatinine, Ser: 0.5 mg/dL — ABNORMAL LOW (ref 0.61–1.24)
GFR calc Af Amer: >60 mL/min (ref >60)
GFR calc non Af Amer: >60 mL/min (ref >60)
Glucose, Bld: 174 mg/dL — ABNORMAL HIGH (ref 70–99)
Potassium: 4.3 mmol/L (ref 3.5–5.1)
Sodium: 132 mmol/L — ABNORMAL LOW (ref 135–145)

## 2020-05-24 LAB — GLUCOSE, CAPILLARY
Glucose-Capillary: 127 mg/dL — ABNORMAL HIGH (ref 70–99)
Glucose-Capillary: 186 mg/dL — ABNORMAL HIGH (ref 70–99)
Glucose-Capillary: 244 mg/dL — ABNORMAL HIGH (ref 70–99)

## 2020-05-24 LAB — PH, BODY FLUID: pH, Body Fluid: 7.7

## 2020-05-24 LAB — VITAMIN A: Vitamin A (Retinoic Acid): 7.7 ug/dL — ABNORMAL LOW (ref 18.9–57.3)

## 2020-05-24 LAB — PROTHROMBIN GENE MUTATION

## 2020-05-24 LAB — TRIGLYCERIDES, BODY FLUIDS: Triglycerides, Fluid: 12 mg/dL

## 2020-05-24 LAB — PHOSPHORUS: Phosphorus: 4.5 mg/dL (ref 2.5–4.6)

## 2020-05-24 LAB — VITAMIN B1: Vitamin B1 (Thiamine): 273.2 nmol/L — ABNORMAL HIGH (ref 66.5–200.0)

## 2020-05-24 LAB — COPPER, SERUM: Copper: 55 ug/dL — ABNORMAL LOW (ref 69–132)

## 2020-05-24 IMAGING — MR MR HEAD WO/W CM
20 series · 47 of 48 positions shown · IV contrast (gadavist)
Comparison: MRI head [DATE], [DATE]

CLINICAL DATA: Encephalopathy suspect MAYTE . History of
cerebral infarct and dural venous thrombosis.

EXAM:
MRI HEAD WITHOUT AND WITH CONTRAST
TECHNIQUE: Multiplanar, multiecho pulse sequences of the brain and surrounding
structures were obtained without and with intravenous contrast.
CONTRAST:  7mL GADAVIST GADOBUTROL 1 MMOL/ML IV SOLN

[Series 5: ax dwi_tracew · axial · 3.0mm · 0.60mm/px · z∈[-118,+33]mm · 3 of 50 slices shown (1 of 2)]
[im 1/50]
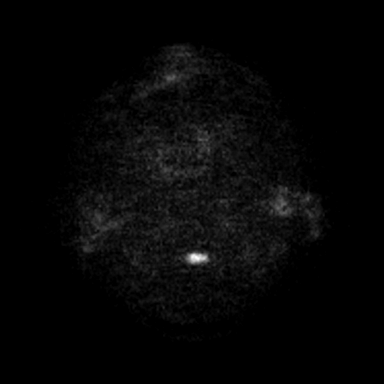
[im 25/50]
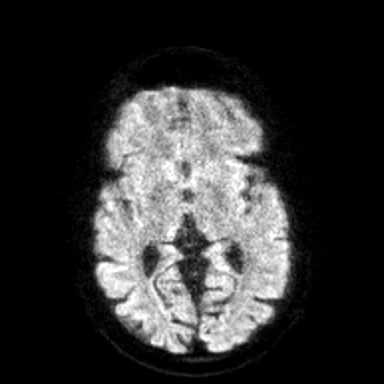
[im 50/50]
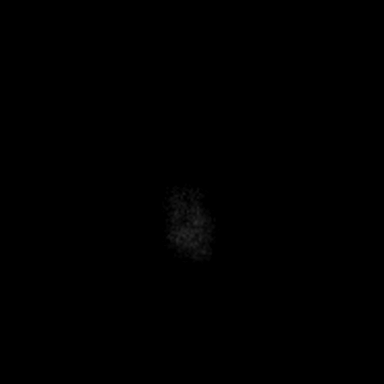

[Series 6: ax dwi_adc · axial · 3.0mm · 0.60mm/px · z∈[-118,+27]mm · 2 of 48 slices shown (1 of 2)]
[im 1/48]
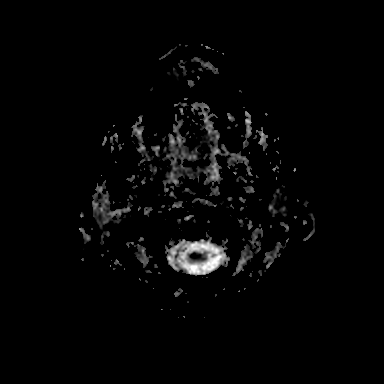
[im 48/48]
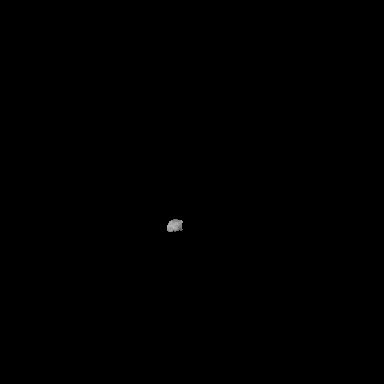

[Series 7: cor dwi_tracew · coronal · 5.0mm · 0.60mm/px · 2 of 38 slices shown (1 of 2)]
[im 1/38]
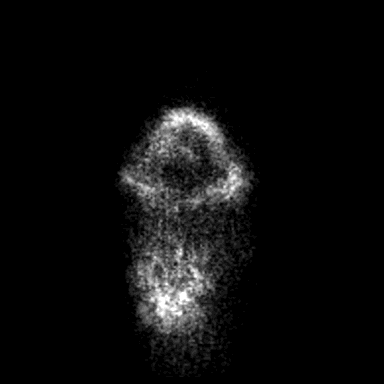
[im 38/38]
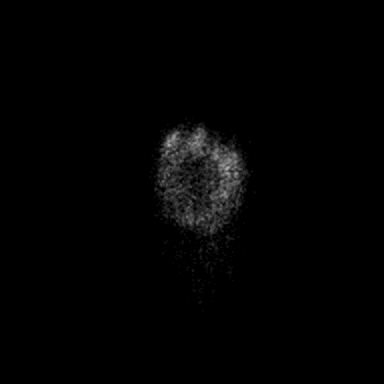

[Series 8: cor dwi_adc · coronal · 5.0mm · 0.60mm/px · 3 of 37 slices shown (1 of 2)]
[im 1/37]
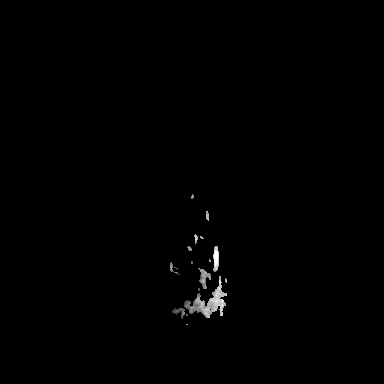
[im 19/37]
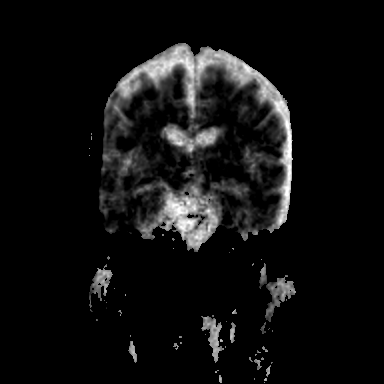
[im 37/37]
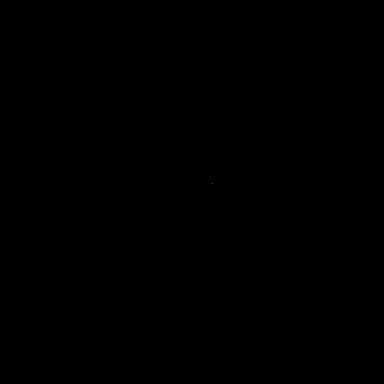

[Series 9: T1 · sagittal · 5.0mm · 0.62mm/px · 2 of 25 slices shown (1 of 2)]
[im 1/25]
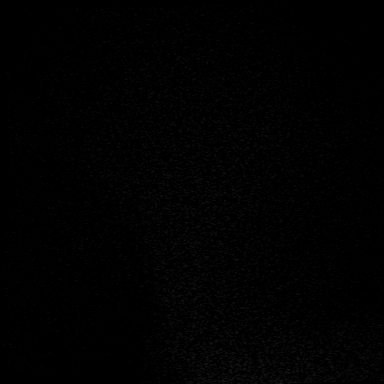
[im 25/25]
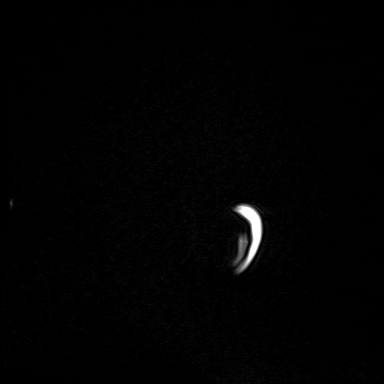

[Series 10: ax dwi_tracew · axial · 3.0mm · 1.31mm/px · z∈[-118,+33]mm · 4 of 50 slices shown (2 of 2)]
[im 1/50]
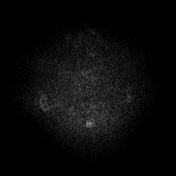
[im 17/50]
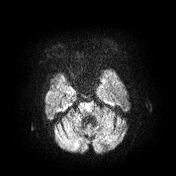
[im 33/50]
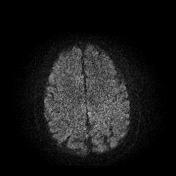
[im 50/50]
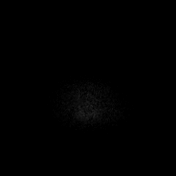

[Series 11: ax dwi_adc · axial · 3.0mm · 1.31mm/px · z∈[-118,+33]mm · 4 of 50 slices shown (2 of 2)]
[im 1/50]
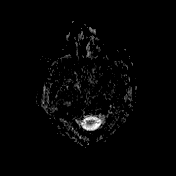
[im 17/50]
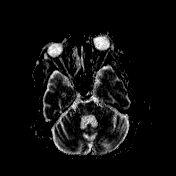
[im 33/50]
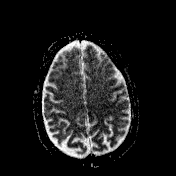
[im 50/50]
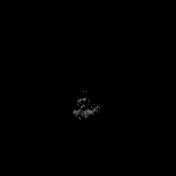

[Series 12: cor dwi_tracew · coronal · 5.0mm · 1.31mm/px · 3 of 38 slices shown (2 of 2)]
[im 1/38]
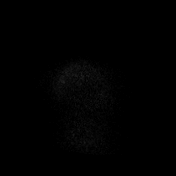
[im 19/38]
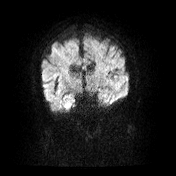
[im 38/38]
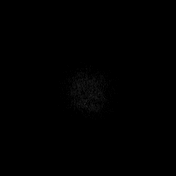

[Series 13: cor dwi_adc · coronal · 5.0mm · 1.31mm/px · 3 of 37 slices shown (2 of 2)]
[im 1/37]
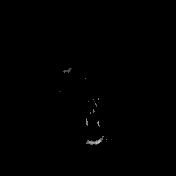
[im 19/37]
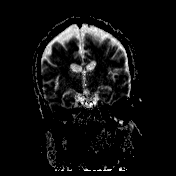
[im 37/37]
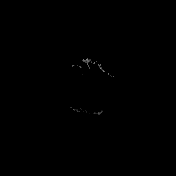

[Series 14: T2 · axial · 5.0mm · 0.45mm/px · z∈[-115,+31]mm · 2 of 27 slices shown (1 of 3)]
[im 1/27]
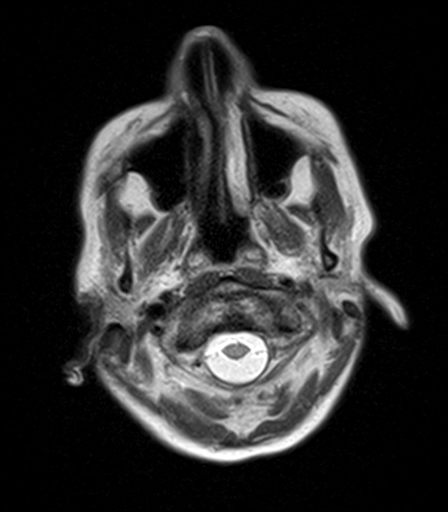
[im 27/27]
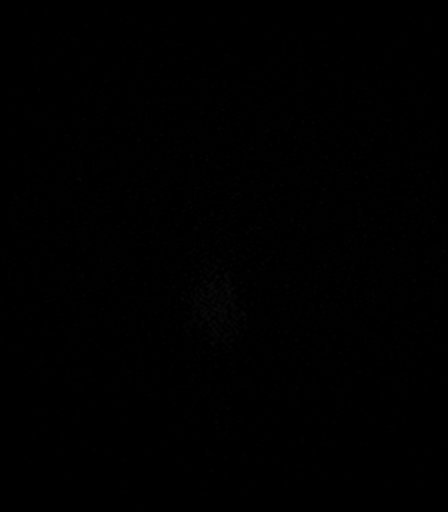

[Series 15: T2-star · axial · 5.0mm · 0.45mm/px · z∈[-115,+31]mm · 2 of 27 slices shown (1 of 2)]
[im 1/27]
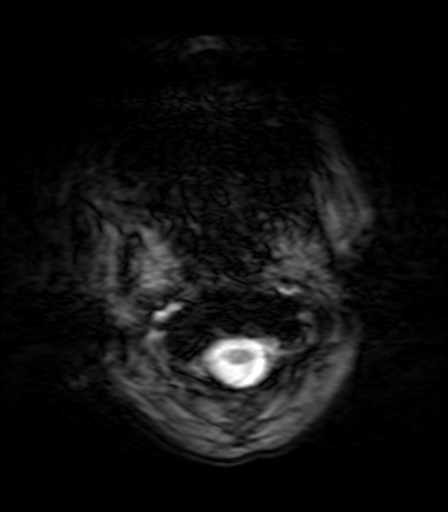
[im 27/27]
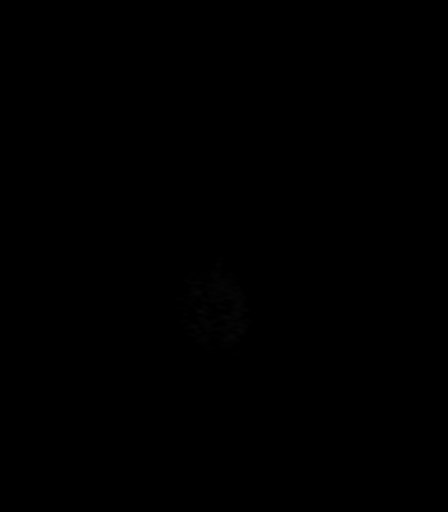

[Series 16: FLAIR · axial · 5.0mm · 1.20mm/px · z∈[-116,+19]mm · 2 of 24 slices shown (1 of 2)]
[im 1/24]
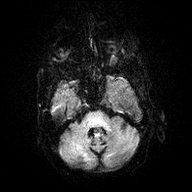
[im 24/24]
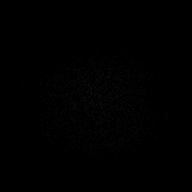

[Series 21: T1 · axial · 5.0mm · 0.90mm/px · z∈[-168,-28]mm · 2 of 27 slices shown (2 of 2)]
[im 1/27]
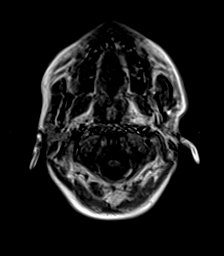
[im 27/27]
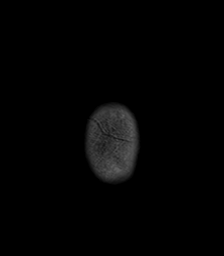

[Series 22: FLAIR · axial · 5.0mm · 1.20mm/px · z∈[-169,-29]mm · 2 of 27 slices shown (2 of 2)]
[im 1/27]
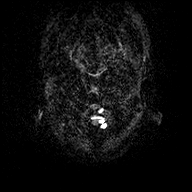
[im 27/27]
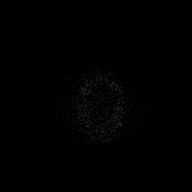

[Series 23: T2 · axial · 3.0mm · 0.47mm/px · z∈[-41,+42]mm · 2 of 35 slices shown (2 of 3)]
[im 1/35]
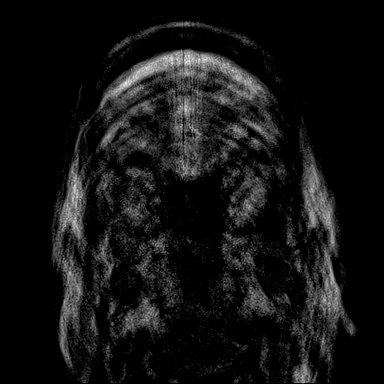
[im 35/35]
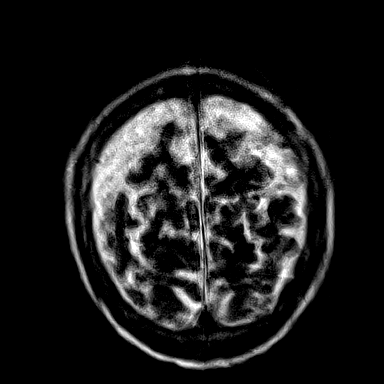

[Series 24: T2-star · axial · 5.0mm · 0.45mm/px · 1 of 27 slices shown (2 of 2)]
[im 1/27]
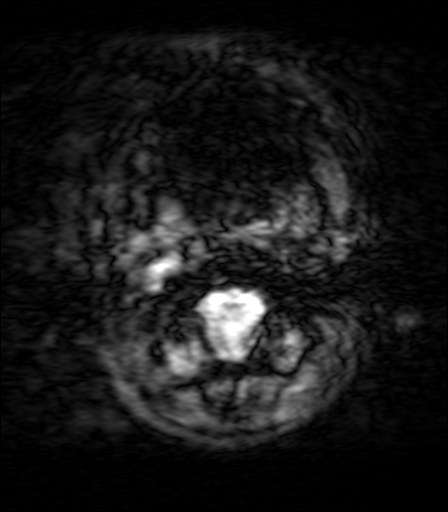

[Series 25: T2 · coronal · 5.0mm · 0.45mm/px · 2 of 31 slices shown (3 of 3)]
[im 1/31]
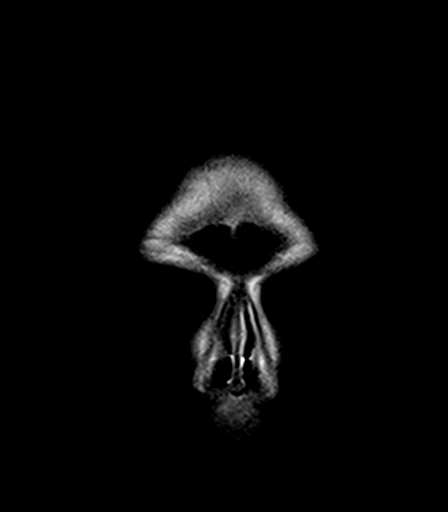
[im 31/31]
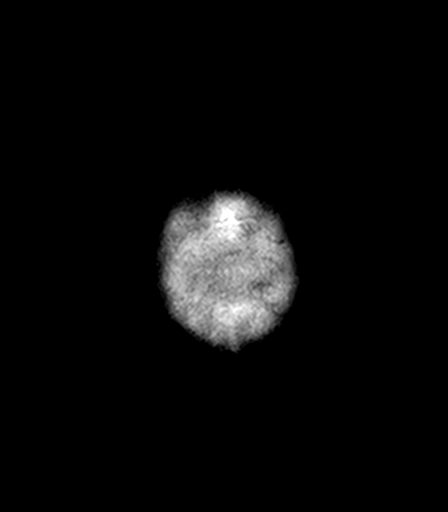

[Series 26: T1 post-contrast · axial · 5.0mm · 0.90mm/px · z∈[-168,-28]mm · 2 of 27 slices shown (1 of 3)]
[im 1/27]
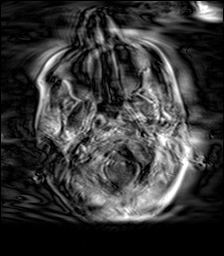
[im 27/27]
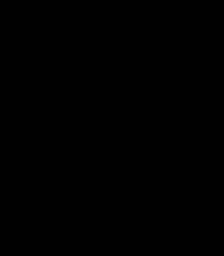

[Series 27: T1 post-contrast · coronal · 5.0mm · 0.90mm/px · 2 of 31 slices shown (2 of 3)]
[im 1/31]
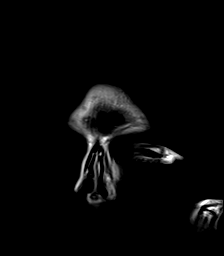
[im 31/31]
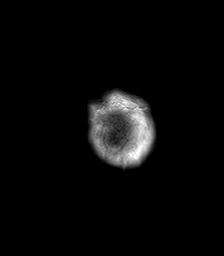

[Series 28: T1 post-contrast · axial · 5.0mm · 0.90mm/px · z∈[-168,-28]mm · 2 of 27 slices shown (3 of 3)]
[im 1/27]
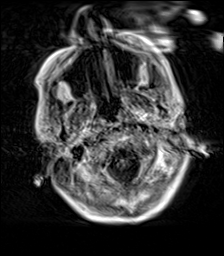
[im 27/27]
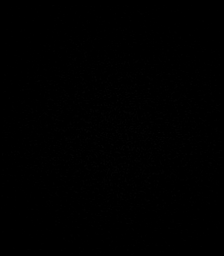

[47 of 48 positions shown; findings below may reference images not displayed]

FINDINGS: Brain: Motion degraded study with multiple repeat attempts.

Negative for acute infarct. Recent infarcts in the right cerebellum
and right parietal lobe and left parietal lobe have resolved.

No parenchymal hemorrhage. Clot is present in the region of the
torcular. No mass lesion or hydrocephalus. Incidental pineal cyst 12
mm.

Postcontrast images degraded by extensive motion. Allowing for this,
no abnormal enhancement identified. There is filling defect in the
left transverse sinus and torcula compatible with residual thrombus.

Vascular: Filling defect in the torcula and left transverse sinus
compatible with thrombus as noted previously. Normal arterial flow
voids.

Skull and upper cervical spine: No focal skeletal lesion.

Sinuses/Orbits: Paranasal sinuses clear.  Negative orbit

Other: Motion degraded study
IMPRESSION: Negative for acute infarct. Resolving infarcts in the right
cerebellum and parietal lobes bilaterally

Persistent clot in the torcula and left transverse sinus similar to
that seen on prior studies.

Image quality degraded by motion.

## 2020-05-24 MED ORDER — VITAMIN A 3 MG (10000 UNIT) PO CAPS
10000.0000 [IU] | ORAL_CAPSULE | Freq: Every day | ORAL | Status: DC
  Administered 2020-05-25 – 2020-05-30 (×4): 10000 [IU] via ORAL
  Filled 2020-05-24 (×12): qty 1

## 2020-05-24 MED ORDER — THIAMINE HCL 100 MG/ML IJ SOLN
500.0000 mg | INTRAVENOUS | Status: DC
  Administered 2020-05-24: 500 mg via INTRAVENOUS
  Filled 2020-05-24: qty 5

## 2020-05-24 MED ORDER — ZINC SULFATE 220 (50 ZN) MG PO CAPS
220.0000 mg | ORAL_CAPSULE | Freq: Every day | ORAL | Status: DC
  Administered 2020-05-25 – 2020-05-30 (×6): 220 mg via ORAL
  Filled 2020-05-24 (×6): qty 1

## 2020-05-24 MED ORDER — THIAMINE HCL 100 MG/ML IJ SOLN
500.0000 mg | Freq: Three times a day (TID) | INTRAVENOUS | Status: AC
  Administered 2020-05-24 – 2020-05-26 (×5): 500 mg via INTRAVENOUS
  Filled 2020-05-24 (×6): qty 5

## 2020-05-24 MED ORDER — THIAMINE HCL 100 MG/ML IJ SOLN
500.0000 mg | Freq: Three times a day (TID) | INTRAVENOUS | Status: DC
  Filled 2020-05-24 (×2): qty 5

## 2020-05-24 MED ORDER — VITAMIN C 500 MG/5ML PO SYRP
500.0000 mg | ORAL_SOLUTION | Freq: Two times a day (BID) | ORAL | Status: DC
  Filled 2020-05-24: qty 5

## 2020-05-24 MED ORDER — METOPROLOL TARTRATE 25 MG PO TABS
25.0000 mg | ORAL_TABLET | Freq: Two times a day (BID) | ORAL | Status: DC
  Administered 2020-05-24 – 2020-05-27 (×8): 25 mg via ORAL
  Filled 2020-05-24 (×8): qty 1

## 2020-05-24 MED ORDER — FUROSEMIDE 10 MG/ML IJ SOLN
20.0000 mg | Freq: Every day | INTRAMUSCULAR | Status: DC
  Administered 2020-05-24 – 2020-05-25 (×2): 20 mg via INTRAVENOUS
  Filled 2020-05-24 (×2): qty 2

## 2020-05-24 MED ORDER — HALOPERIDOL LACTATE 5 MG/ML IJ SOLN
2.0000 mg | Freq: Four times a day (QID) | INTRAMUSCULAR | Status: DC | PRN
  Administered 2020-05-24 – 2020-05-26 (×2): 2 mg via INTRAVENOUS
  Filled 2020-05-24 (×2): qty 1

## 2020-05-24 MED ORDER — GADOBUTROL 1 MMOL/ML IV SOLN
7.0000 mL | Freq: Once | INTRAVENOUS | Status: AC | PRN
  Administered 2020-05-24: 7 mL via INTRAVENOUS

## 2020-05-24 MED ORDER — ADULT MULTIVITAMIN W/MINERALS CH
1.0000 | ORAL_TABLET | Freq: Every day | ORAL | Status: DC
  Administered 2020-05-24 – 2020-05-31 (×8): 1 via ORAL
  Filled 2020-05-24 (×8): qty 1

## 2020-05-24 MED ORDER — FE FUMARATE-B12-VIT C-FA-IFC PO CAPS
1.0000 | ORAL_CAPSULE | Freq: Two times a day (BID) | ORAL | Status: DC
  Administered 2020-05-24 – 2020-05-31 (×14): 1 via ORAL
  Filled 2020-05-24 (×16): qty 1

## 2020-05-24 MED ORDER — ZINC SULFATE 220 (50 ZN) MG PO CAPS: 220.0000 mg | ORAL_CAPSULE | Freq: Every day | ORAL | Status: DC

## 2020-05-24 MED ORDER — ASCORBIC ACID 500 MG PO TABS
500.0000 mg | ORAL_TABLET | Freq: Two times a day (BID) | ORAL | Status: DC
  Administered 2020-05-24 – 2020-05-31 (×14): 500 mg via ORAL
  Filled 2020-05-24 (×14): qty 1

## 2020-05-24 MED ORDER — OCUVITE-LUTEIN PO CAPS
1.0000 | ORAL_CAPSULE | Freq: Every day | ORAL | Status: DC
  Administered 2020-05-24 – 2020-05-30 (×7): 1 via ORAL
  Filled 2020-05-24 (×8): qty 1

## 2020-05-24 MED ORDER — LORAZEPAM 2 MG/ML IJ SOLN
1.0000 mg | Freq: Once | INTRAMUSCULAR | Status: AC
  Administered 2020-05-24: 1 mg via INTRAVENOUS
  Filled 2020-05-24: qty 1

## 2020-05-24 MED ORDER — THIAMINE HCL 100 MG/ML IJ SOLN
Freq: Every day | INTRAVENOUS | Status: AC
  Filled 2020-05-24 (×3): qty 50

## 2020-05-24 NOTE — Progress Notes (Signed)
Removed all telemetry leads, states he wants to go home, he is tired of being in the hospital and that he just wants to go home and see his son before he dies believes that we are not doing anything to make his life better. I asked if he would let me put his telemetry leads back on, he refused, stating that if he dies we are just going to watch him die. Wants to call his fiancee and go home against medical advice, but he does not have his cell phone to call her. I told him that I would call his fiancee so he could speak with her. I did ask that rethink his choice, he again stated that he just wants to go home and die. I told him that he was too young to just die, he stated that he has had several people in his family die young.

## 2020-05-24 NOTE — Consult Note (Signed)
WOC Nurse Consult Note: Reason for Consult:scattered full thickness injuries to lower legs and heels.  Maroon discoloration to coccyx.  Patient is up and moving now, actually requesting to leave AMA.  Will implement topical therapy. Wound type:unknown etiology with biopsy this AM.  Pressure Injury POA: Yes Measurement: coccyx  2 cm maroon discoloration Scattered 1 cm lesions to lower legs and feet.  Wound HCW:CBJSEG discoloration Drainage (amount, consistency, odor) minimal serosanguinous  No odor.  Periwound:intact Dressing procedure/placement/frequency: Silicone foam dressings to skin breakdown. Change every three days and PRN soilage.  Will not follow at this time.  Please re-consult if needed.  Maple Hudson MSN, RN, FNP-BC CWON Wound, Ostomy, Continence Nurse Pager [phone]

## 2020-05-24 NOTE — Progress Notes (Signed)
Nutrition Follow-up  DOCUMENTATION CODES:   Severe malnutrition in context of social or environmental circumstances  INTERVENTION:   Ensure Enlive po TID, each supplement provides 350 kcal and 20 grams of protein  Magic cup TID with meals, each supplement provides 290 kcal and 9 grams of protein  Vitamin C 546m po BID  Vitamin A 10,000 U po daily x 30 days  Zing 2230m(5027mlemental) po daily x 10 days  Ocuvite po daily (provides zinc, vitamin A, vitamin C, Vitamin E, copper, and selenium)  Folic acid and thiamine IV daily   Vitamin D 50,000 units po weekly  MVI po daily   NUTRITION DIAGNOSIS:   Severe Malnutrition related to social / environmental circumstances (EtOH abuse) as evidenced by severe fat depletion, severe muscle depletion. Ongoing.  GOAL:   Patient will meet greater than or equal to 90% of their needs -previously met with TPN  MONITOR:   PO intake, Supplement acceptance, Labs, Weight trends, Skin, I & O's  ASSESSMENT:   [age]ar old male with PMHx of EtOH abuse admitted with alcohol withdrawal, acute alcoholic pancreatitis, acute metabolic encephalopathy, ascites s/p paracentesis on 6/28 with 2.8 L fluid removed, chest pain, acute renal failure.   Pt removed PICC line today; TPN discontinued. Calorie count in progress. Pt on a regular diet. Pt with numerous vitamin deficiencies r/t his chronic etoh abuse; RD will supplement. Continue Ensure supplements and regular diet.   Medications reviewed and include: marinol, lovenox, folic acid, insulin, protonix, vitamin D, meropenem, thiamine   Labs reviewed: Na 132(L), K 4.3 wnl, P 4.5 wnl, Mg 2.7(H) Vitamin D- 7.39(L)- 7/13 Copper 55(L)- 7/13 Vitamin A- 7.7(L)- 7/13 Vitamin C- 0.1(L)- 7/13 Zinc 49- 7/13 Folate- 5.6(L)- 7/12 Wbc- 16.9(H), Hgb 7.9(L), Hct 23.9(L) cbgs- 244, 186, 127 x 24 hrs  Diet Order:   Diet Order            Diet regular Room service appropriate? Yes; Fluid consistency: Thin  Diet  effective now                EDUCATION NEEDS:   No education needs have been identified at this time  Skin:  Skin Assessment: Reviewed RN Assessment  Last BM:  7/14- type 7  Height:   Ht Readings from Last 1 Encounters:  05/16/20 '5\' 7"'  (1.702 m)   Weight:   Wt Readings from Last 1 Encounters:  05/24/20 69.7 kg   Ideal Body Weight:  83.6 kg  BMI:  Body mass index is 24.07 kg/m.  Estimated Nutritional Needs:   Kcal:  1900-2200  Protein:  95-105 grams  Fluid:  1.9-2.2 L/day  CasKoleen Distance, RD, LDN Please [facility] and/or RD on-call/weekend/after hours pager

## 2020-05-24 NOTE — Progress Notes (Signed)
PROGRESS NOTE    Shane Campillo Sr.  FUX:[id number] DOB: [birth year] DOA: 05/03/2020 PCP: Lorelee Market, MD   Brief Narrative:  34 year old gentleman with no medical history, chronic alcoholism, has been drinking nonstop about 12 packs of beer daily since age of 17 presented to the ED on  05/03/2020 for evaluation of progressive confusion and lethargy. He also had episode of unresponsiveness in the triage area. As per patient's fianc, patient stopped drinking about 24 hours prior to arrival and started behaving altered, confused. Also reported multiple episodes diarrhea, but no nausea or vomiting.  In ED he had 1 witnessed seizure. CT abdomen with concern of pancreatitis with elevated lipase, bilateral pleural effusions and ascites.  He was initially admitted for management of acute alcohol withdrawal and alcoholic pancreatitis.  Patient has a very complicated course of illness, underwent alcohol withdrawal.  There was some concern of SBP.  He was evaluated by surgeons on 05/06/2020 for concern of acute compartment syndrome and abdomen and found to have some intra-abdominal hypertension which was managed conservatively by placing Foley catheter and NG tube.  Sentences with cell count of 822, neutrophils of 79%.  Lipase was elevated at 53,000, LDH 526 and amylase of 10,000.  Cultures were negative.  Consistent with pancreatic ascites.   Lipase continued to get worse initially with peaked at 1543, now trending down.  MRCP and repeat CT abdomens with some attenuations in pancreatic head and body, some improvement in edema.      Neurology was consulted on 05/10/2020 due to worsening altered mental status. 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  Patient did not receive any coronavirus [employer] vaccine.  He was initiated on heparin.  EEG showed generalized background slowing.  No epileptiform activity was noted. A  repeat MRI brain without contrast done on 05/15/2020 showed Residual abnormal diffusion restriction over the right posterior parietal lobe and right cerebellum, consistent with expected evolution of subacute venous infarcts.  It was started on Heparin by neurology.  On 05/16/2019 1 in the evening he became hypotensive and hypothermic with worsening leukocytosis and development of lactic acidosis.  He was transferred to ICU on 05/16/2020 due to the concern for developing septic shock.  He was started on pressors.  His lactate was 2 1 pro-Cal was 1.84 WBC was 40.4 and hemoglobin was 11.9.  CT abdomen done on 05/16/2020 showed peripancreatic edema with multiple pseudocyst.  The dominant pseudocyst has decreased to 2.2 cm.  There was extensive ascites with loculation around the liver right gutter and pelvis. Patient has been started on meropenem.  Although urine, blood and ascitic fluid cultures remain negative.  C. difficile colitis and GI pathogens were negative.  Plan is to continue meropenem for 7 days for concern of infected pseudocyst.  Patient did developed decreased in hemoglobin and there was some concern of GI bleed.  Patient and his wife did endorse some blood in his stool before coming to the hospital.  Patient has a strong family history of colon cancer where both of his parents died of colon cancer, mother in 71s and father in 63s.  GI is recommending outpatient work-up after this acute illness.   TPN was started on 05/17/2020 due to poor p.o. intake.  Subjective: Patient was quite confused when seen today. He was also confabulating.  Assessment & Plan:   Principal Problem:   Acute alcoholic pancreatitis Active Problems:   Alcohol withdrawal seizure with  delirium ([place])   Hyponatremia   Hypokalemia   AKI (acute kidney injury) ([place])   Elevated CK   Heme positive stool   Thrombocytopenia (HCC)   Acute metabolic encephalopathy   Severe malnutrition (H[facility])   Sinus tachycardia   Ascites    Hypotension   Protein-calorie malnutrition, severe  Acute encephalopathy.  Patient developed acute encephalopathy by pulling all his lines and trying to get out of bed.  When saw today he was confabulating.  Concern of Wernicke's encephalopathy.  Patient was getting some thiamine with TPN. -We will try high-dose thiamine 500 mg 3 times a day for 2 days followed by 250 mg daily for another 3 days. -MRI brain.  Sepsis with septic shock.  Resolved  Transferred to ICU on 05/15/2020 due to hypothermia and hypotension requiring Levophed.  Now off the Levophed and maintaining his blood pressure.  ID was also consulted and they started him on meropenem for concern of infected pseudocyst. Remained afebrile over the last 48-hour.  Mild worsening in leukocytosis. -Continue meropenem for total of 7 days.  Day 7 today.  Blood transfusion reaction.  Patient did develop some rash and become short of breath with some pulmonary vascular congestion yesterday while getting blood transfusion. -Tolerated another transfusion after that without any problem.. Total of 3.5 units of PRBC given during current hospitalization.  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   CT venogram on 7/3 and a repeat on 7/5 with persistent thrombosis at the confluence of sinuses and left transverse dural venous sinus.  Neurology is following and they start him on heparin infusion.  Patient also had acute right cerebellum and a possible old infarcts involving parietal lobes and left occipital region, seen on MRI done on 05/12/2020.  No hypercoagulable work-up. Due to his worsening lower extremity rash which is also involving some upper extremities there is some concern of vasculitis.  Patient also has some rash/thrush in his mouth. His differential is broad at this time which includes rheumatologic disorder/vasculitis/Behcet's syndrome/malignancy. Elevated ESR and CRP.  CK  has been normalized. Dr. Jefm Bryant from rheumatology saw the patient and agrees about his confusing picture as he is not fitting on any definite thing.  His rash and CVT can be explained with Behcet's syndrome but that is a clinical diagnosis.  No oral or genital ulceration.  No vision or eye changes.  Vasculitis or panniculitis can be another possibility.  We asked dermatology to do a skin biopsy which was done on 05/23/20-pending results. -Patient was started on Solu-Medrol 80 mg daily after the skin biopsy according to the advice of Dr. Jefm Bryant to see his response for next couple of days. -RPR and hepatitis panel negative. -ANCA titers negative. -Cardiolipin antibodies negative. -Antithrombin III activity reduced at 59 with reduced AC [place] at 30.  Which is more consistent with increased consumption as patient has active thrombosis.  He will need a repeat testing after acute illness and needs a follow-up with hematologist as an outpatient. -Protein S levels within normal limit. -CA 19-9 mildly elevated at 81-which is very nonspecific. -Vitamin D level reduced at 7.39-we started him on repletion. -Folic acid was reduced-started him on supplement. -Rest of the labs which include protein C, factor V [place], prothrombin gene mutation, lupus anticoagulant profile, zinc, vitamin B1 levels are pending.  Erythematous skin rash.  Seems improving There is some concern of vasculitis.  Less likely hood of cellulitis as it is present on all extremities.  -Skin biopsy was done  by dermatology on 05/23/20-awaiting pathology results.  Thrombocytopenia.  Stable with some improvement in platelets at 130.   HIT antibodies are negative. Switch heparin with therapeutic Lovenox.  Patient will need long-term anticoagulation once able to take p.o. intake.    Anemia.  Hemoglobin at 7.9.  Received total of 3.5 units of PRBC.   Per wife he did had some bleeding per rectum at home.  Not aware of any obvious bleeding while in  the hospital.  Patient is critically ill also.  Had some mild hematuria on UA done on 05/16/2020.  No gross hematuria.  CT abdomen without any retroperitoneal bleed and repeat paracentesis on 05/23/2020 was without any blood. -Check anemia panel-iron studies consistent with anemia of chronic disease.  Low folic acid, normal M84. -Start him on folic acid supplement. -Continue to monitor and transfuse below 7.  Chronic pancreatitis with pseudocyst formation.  Present on admission.  CT abdomen without any gallbladder abnormalities but did had multiple cyst in pancreatic head and body.  Lipase peaked at 1543, now trending down. -CT abdomen was repeated 7/13 due to concern of retroperitoneal bleeding.  It shows stable pancreatitis changes with pseudocyst.  Stable ascites and pleural effusion. -Continue supportive care.  Severe protein calorie malnutrition.  POA, most likely secondary to chronic alcohol abuse.  Patient does not want enteric feeding with NG tube.  Currently on TPN since 05/17/2020. BMI of 23.58 . -Continue monitoring daily CMP, magnesium, phosphorous. -Encourage p.o. intake. -Stop TPN as patient started taking some p.o. food. -We will ask dietitian to supplement for proper nutrition.  Nutritional deficiencies.  Secondary to alcohol abuse. Patient so far found to have deficiencies of vitamin A, vitamin C, vitamin D and copper.  Zinc is within lower normal limit.  Thiamine results are pending. -Ask pharmacy for proper supplement.  Transaminitis.  Started improving. He was on TPN and also has an history of alcohol abuse. -Continue to monitor.  TSH elevation.  T4 normal.  No acute concern at this time.  Diarrhea.  No bowel movement for the past 3 days.  Sinus tachycardia.  No obvious cause.  Tachycardia persist off the pressors.  Denies any pain.  Past alcohol withdrawal.  Cultures negative. Echo normal.  Heart rate remained in low 100 despite increasing the dose of metoprolol to 25 mg  twice daily yesterday. -Increase the dose of metoprolol to 50 mg twice daily.  Pancreatic ascites.  Paracentesis labs are consistent with pancreatic ascites.  CT abdomen with mild fatty infiltrate, no cirrhosis.  Patient underwent paracentesis twice 1 on 05/07/2020 and second on 05/16/2020.  Culture remain negative.  Repeat CT abdomen on 7/13 with stable ascites. -Repeat paracentesis -on 7/14, again consistent with pancreatic ascites with negative cultures.  Pressure injuries.  High risk due to severe protein calorie malnutrition and inability to move.  Getting some unstageable breakdown on his sacrum and bilateral heels. -Continue to monitor. -Frequent position changes.  AKI.  Resolved Patient had AKI on admission.  Most likely prerenal as creatinine normalized with IV hydration. -Continue to monitor.  Electrolyte abnormalities.  Patient had hypervolemic hyponatremia which was present on admission and has been resolved.  He also developed some hypokalemia which has been resolved at this time.   -Continue to monitor BMP, magnesium and phosphate.  Depression.  Patient appears depressed.  Per wife he is feeling very down since the death of his father and drinking a lot in order to overcome his depression. -Psychiatry consult-was consulted many days ago, no notes  yet. -Another message to Dr. Janese Banks  Palliative care consult.  Palliative care was consulted to discuss goals of care.  Patient and his wife will continue aggressive measures and to treat treatables.  Code was changed to DNR. There was another palliative care meeting today and they were talking regarding moving to comfort care.  In my opinion patient is not appropriate for complete comfort measures at this time.  Acute change his mentation today most likely secondary to Wernicke's.  I discussed with wife to give him few more days to see the response with thiamine.  Patient is critically ill with multiorgan involvement and is very high risk  for deterioration and death.   Objective: Vitals:   Jun 02, 2020 0400 2020-06-02 0500 Jun 02, 2020 0600 06/02/2020 1300  BP: 125/84 (!) 136/92  (!) 137/97  Pulse:  (!) 110  (!) 108  Resp: 20 (!) 23 19 (!) 21  Temp:      TempSrc:      SpO2:  92%  96%  Weight:      Height:        Intake/Output Summary (Last 24 hours) at 2020-06-02 1335 Last data filed at 06/02/2020 1310 Gross per 24 hour  Intake 3716.98 ml  Output 3600 ml  Net 116.98 ml   Filed Weights   05/16/20 0030 05/22/20 0159 02-Jun-2020 0132  Weight: 68.3 kg 68.2 kg 69.7 kg    Examination:  General exam: Chronically ill-appearing gentleman, appears comfortable but confabulating. Respiratory system: Clear bilaterally, respiratory effort normal. Cardiovascular system: S1 & S2 heard, RRR. Gastrointestinal system: Soft, nontender, mildly distended, bowel sounds positive. Central nervous system: Alert and oriented x2 today. No focal neurological deficits. Extremities: 2+ LE edema, no cyanosis, pulses intact and symmetrical. Skin: Erythematous rash involving bilateral lower extremities and upper extremities.  Rash seems improving, lower extremity rash with some central grayish area. Psychiatry: Judgement and insight appear impaired.   DVT prophylaxis: Heparin Code Status: DNR Family Communication: Discussed with wife at bedside. Disposition Plan:  Status is: Inpatient  Remains inpatient appropriate because:Inpatient level of care appropriate due to severity of illness   Dispo: The patient is from: Home              Anticipated d/c is to: To be determined              Anticipated d/c date is: > 3 days              Patient currently is not medically stable to d/c.  Consultants:   Neurology  Gastroenterology  General surgery  Palliative care  PCCM  Rheumatology  Dermatology  Procedures:  Antimicrobials:  Meropenem  Data Reviewed: I have personally reviewed following labs and imaging studies  CBC: Recent Labs   Lab 05/20/20 0439 05/20/20 0439 05/21/20 0434 05/22/20 0155 05/23/20 0458 05/23/20 1229 06/02/20 0409  WBC 15.9*  --  15.7* 19.0* 15.6*  --  16.9*  NEUTROABS  --   --  11.9*  --   --   --   --   HGB 8.1*  --  7.6* 6.8* 6.9*  --  7.9*  HCT 24.3*   < > 23.0* 20.7* 20.7* 20.8* 23.9*  MCV 91.7  --  92.4 94.1 92.8  --  98.0  PLT 77*  --  79* 85* 88*  --  130*   < > = values in this interval not displayed.   Basic Metabolic Panel: Recent Labs  Lab 05/20/20 0439 05/20/20 0439 05/21/20 0434 05/22/20 0155 05/23/20  3762 05/24/20 0409 05/24/20 0550  NA 137   < > 137 132* 134* 128* 132*  K 4.9   < > 5.0 4.7 4.5 5.9* 4.3  CL 103   < > 103 98 95* 93* 94*  CO2 28   < > '28 27 27 27 30  ' GLUCOSE 149*   < > 133* 131* 131* 939* 174*  BUN 14   < > '18 18 19 18 18  ' CREATININE 0.58*   < > 0.57* 0.54* 0.52* 0.51* 0.50*  CALCIUM 8.1*   < > 8.3* 8.1* 8.3* 8.3* 8.2*  MG 1.7  --  1.7 1.8 1.7 2.7*  --   PHOS 3.9  --  4.7* 4.9* 4.6 4.5  --    < > = values in this interval not displayed.   GFR: Estimated Creatinine Clearance: 121.6 mL/min (A) (by C-G formula based on SCr of 0.5 mg/dL (L)). Liver Function Tests: Recent Labs  Lab 05/18/20 0517 05/19/20 0214 05/20/20 0439 05/21/20 0434 05/22/20 0155 05/23/20 0458 05/24/20 0409  AST 36  --   --  115* 138* 79* 80*  ALT 18  --   --  61* 105* 87* 83*  ALKPHOS 86  --   --  188* 169* 150* 137*  BILITOT 0.4  --   --  0.5 0.6 0.5 0.5  PROT 4.1*  --   --  4.6* 4.7* 4.9* 4.7*  ALBUMIN 1.5*   < > 1.4* 1.4* 1.4* 1.4* 1.5*   < > = values in this interval not displayed.   Recent Labs  Lab 05/18/20 1830 05/21/20 1454  LIPASE 171* 241*   Recent Labs  Lab 05/23/20 1532  AMMONIA 12   Coagulation Profile: No results for input(s): INR, PROTIME in the last 168 hours. Cardiac Enzymes: Recent Labs  Lab 05/21/20 1454 05/22/20 0155  CKTOTAL 30* 23*   BNP (last 3 results) No results for input(s): PROBNP in the last 8760 hours. HbA1C: No results  for input(s): HGBA1C in the last 72 hours. CBG: Recent Labs  Lab 05/23/20 0508 05/23/20 1430 05/24/20 0001 05/24/20 0535 05/24/20 1118  GLUCAP 129* 129* 244* 186* 127*   Lipid Profile: No results for input(s): CHOL, HDL, LDLCALC, TRIG, CHOLHDL, LDLDIRECT in the last 72 hours. Thyroid Function Tests: No results for input(s): TSH, T4TOTAL, FREET4, T3FREE, THYROIDAB in the last 72 hours. Anemia Panel: No results for input(s): VITAMINB12, FOLATE, FERRITIN, TIBC, IRON, RETICCTPCT in the last 72 hours. Sepsis Labs: Recent Labs  Lab 05/18/20 0517 05/21/20 0434 05/22/20 0155 05/23/20 0458 05/23/20 1515  PROCALCITON 0.84 1.03 1.24 1.34  --   LATICACIDVEN  --   --   --   --  1.0    Recent Results (from the past 240 hour(s))  CULTURE, BLOOD (ROUTINE X 2) w Reflex to ID Panel     Status: None   Collection Time: 05/15/20 11:52 PM   Specimen: Left Antecubital; Blood  Result Value Ref Range Status   Specimen Description LEFT ANTECUBITAL  Final   Special Requests   Final    BOTTLES DRAWN AEROBIC AND ANAEROBIC Blood Culture adequate volume   Culture   Final    NO GROWTH 5 DAYS Performed at [facility], [place] [place],  [address]    Report Status 05/21/2020 FINAL  Final  CULTURE, BLOOD (ROUTINE X 2) w Reflex to ID Panel     Status: None   Collection Time: 05/15/20 11:53 PM   Specimen: BLOOD LEFT HAND  Result Value Ref Range Status   Specimen Description BLOOD LEFT HAND  Final   Special Requests   Final    BOTTLES DRAWN AEROBIC AND ANAEROBIC Blood Culture adequate volume   Culture   Final    NO GROWTH 5 DAYS Performed at [facility], [address] [place], [place] [address]    Report Status 05/21/2020 FINAL  Final  MRSA PCR Screening     Status: None   Collection Time: 05/16/20 12:24 AM   Specimen: Nasopharyngeal  Result Value Ref Range Status   MRSA by PCR NEGATIVE NEGATIVE Final    Comment:        The GeneXpert MRSA Assay  (FDA approved for NASAL specimens only), is one component of a comprehensive MRSA colonization surveillance program. It is not intended to diagnose MRSA infection nor to guide or monitor treatment for MRSA infections. Performed at [facility], [address] [place], [place] [address]   Urine Culture     Status: None   Collection Time: 05/16/20  3:27 AM   Specimen: Urine, Random  Result Value Ref Range Status   Specimen Description   Final    URINE, RANDOM Performed at [facility], [address], [place], [place] [address]    Special Requests   Final    NONE Performed at [facility], [address], [place], [place] [address]    Culture   Final    NO GROWTH Performed at [facility], [place] [address], [place], [place] [address]    Report Status 05/17/2020 FINAL  Final  Gastrointestinal Panel by PCR , Stool     Status: None   Collection Time: 05/17/20  3:06 PM   Specimen: Stool  Result Value Ref Range Status   Campylobacter species NOT DETECTED NOT DETECTED Final   Plesimonas shigelloides NOT DETECTED NOT DETECTED Final   Salmonella species NOT DETECTED NOT DETECTED Final   Yersinia enterocolitica NOT DETECTED NOT DETECTED Final   Vibrio species NOT DETECTED NOT DETECTED Final   Vibrio cholerae NOT DETECTED NOT DETECTED Final   Enteroaggregative E coli (EAEC) NOT DETECTED NOT DETECTED Final   Enteropathogenic E coli (EPEC) NOT DETECTED NOT DETECTED Final   Enterotoxigenic E coli (ETEC) NOT DETECTED NOT DETECTED Final   Shiga like toxin producing E coli (STEC) NOT DETECTED NOT DETECTED Final   Shigella/Enteroinvasive E coli (EIEC) NOT DETECTED NOT DETECTED Final   Cryptosporidium NOT DETECTED NOT DETECTED Final   Cyclospora cayetanensis NOT DETECTED NOT DETECTED Final   Entamoeba histolytica NOT DETECTED NOT DETECTED Final   Giardia lamblia NOT DETECTED NOT DETECTED Final   Adenovirus F40/41 NOT DETECTED NOT DETECTED Final    Astrovirus NOT DETECTED NOT DETECTED Final   Norovirus GI/GII NOT DETECTED NOT DETECTED Final   Rotavirus A NOT DETECTED NOT DETECTED Final   Sapovirus (I, II, IV, and V) NOT DETECTED NOT DETECTED Final    Comment: Performed at [facility], [address] [place] [place], [place] [address]  C Difficile Quick Screen (NO PCR Reflex)     Status: None   Collection Time: 05/17/20  3:06 PM   Specimen: STOOL  Result Value Ref Range Status   C Diff antigen NEGATIVE NEGATIVE Final   C Diff toxin NEGATIVE NEGATIVE Final   C Diff interpretation No C. difficile detected.  Final    Comment: Performed at [facility], [address], [place], [place] [address]  Aerobic Culture (superficial specimen)  Status: None (Preliminary result)   Collection Time: 05/21/20  2:30 PM   Specimen: Wound  Result Value Ref Range Status   Specimen Description   Final    WOUND Performed at [facility], [address], [place], [place] [address]    Special Requests   Final    NONE Performed at [facility], [place], [place] [address]    Gram Stain NO WBC SEEN NO ORGANISMS SEEN   Final   Culture   Final    NO GROWTH 3 DAYS NO ANAEROBES ISOLATED; CULTURE IN PROGRESS FOR 5 DAYS Performed at [facility], [address] [place], [place] [address]    Report Status PENDING  Incomplete  Peritoneal Fluid culture ( includes gram stain)     Status: None (Preliminary result)   Collection Time: 05/23/20 11:41 AM   Specimen: Peritoneal Washings; Peritoneal Fluid  Result Value Ref Range Status   Specimen Description   Final    PERITONEAL Performed at [facility], [address], [place], [place] [address]    Special Requests   Final    Normal Performed at [facility], [address] [place], [place] [address]    Gram Stain   Final    WBC PRESENT,BOTH PMN AND MONONUCLEAR NO ORGANISMS SEEN CYTOSPIN SMEAR    Culture   Final    NO  GROWTH < 12 HOURS Performed at [facility], [place] [address] [place], [place] [address]    Report Status PENDING  Incomplete  Body fluid culture     Status: None (Preliminary result)   Collection Time: 05/23/20  1:29 PM   Specimen: PATH Cytology Peritoneal fluid  Result Value Ref Range Status   Specimen Description   Final    PERITONEAL Performed at [facility], [address] [place], [place] [address]    Special Requests   Final    NONE Performed at [facility], [address] [place], [place] [address]    Gram Stain   Final    WBC PRESENT,BOTH PMN AND MONONUCLEAR NO ORGANISMS SEEN CYTOSPIN SMEAR    Culture   Final    NO GROWTH < 12 HOURS Performed at [facility], [place] [address] [place], [place] [address]    Report Status PENDING  Incomplete  Aerobic Culture (superficial specimen)     Status: None (Preliminary result)   Collection Time: 05/23/20  1:29 PM   Specimen: PATH Cytology Peritoneal fluid  Result Value Ref Range Status   Specimen Description   Final    PERITONEAL Performed at [facility], [address], [place], [place] [address]    Special Requests   Final    NONE Performed at [facility], [address] [place], [place] [address]    Gram Stain   Final    RARE WBC PRESENT, PREDOMINANTLY MONONUCLEAR NO ORGANISMS SEEN    Culture   Final    NO GROWTH < 12 HOURS Performed at [facility], [place] [address], [place], [place] [address]    Report Status PENDING  Incomplete     Radiology Studies: CT HEAD WO CONTRAST  Result Date: 05/23/2020 CLINICAL DATA:  Altered mental status, unclear cause. EXAM: CT HEAD WITHOUT CONTRAST TECHNIQUE: Contiguous axial images were obtained from the base of the skull through the vertex without intravenous contrast. COMPARISON:  CT venogram head 05/20/2020, brain MRI 05/15/2020, head CT 05/14/2020. FINDINGS: Brain: Cerebral  volume is normal for age. Redemonstrated  subtle cortical hyperdensity within the right parietal lobe and small focus of hypodensity within the right cerebellum at sites of known subacute venous infarcts. No interval infarct is identified. No evidence of acute intracranial hemorrhage. No extra-axial fluid collection. No evidence of intracranial mass. No midline shift. Vascular: No hyperdense vessel. Venous thrombus within the torcula and left transverse sinus was better appreciated on prior CT venogram head 05/20/2020. Skull: Normal. Negative for fracture or focal lesion. Sinuses/Orbits: Visualized orbits show no acute finding. No significant paranasal sinus disease or mastoid effusion at the imaged levels. 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 No CT evidence of interval intracranial abnormality. Electronically Signed   By: Kellie Simmering DO   On: 05/23/2020 15:36   US Paracentesis  Result Date: 05/23/2020 INDICATION: History of alcoholic cirrhosis with recurrent symptomatic ascites and concern for hemoperitoneum. Please perform ultrasound-guided paracentesis for diagnostic and therapeutic purposes. EXAM: ULTRASOUND-GUIDED PARACENTESIS COMPARISON:  Ultrasound-guided paracentesis-07/07/202 (yielding 3 L of peritoneal fluid) 1; CT abdomen pelvis-05/22/2020 MEDICATIONS: None. COMPLICATIONS: None immediate. TECHNIQUE: Informed written consent was obtained from the patient after a discussion of the risks, benefits and alternatives to treatment. A timeout was performed prior to the initiation of the procedure. Initial ultrasound scanning demonstrates a small amount of ascites within the right lower abdomen which was subsequently prepped and draped in the usual sterile fashion. 1% lidocaine with epinephrine was used for local anesthesia. Under direct ultrasound guidance, a 19 gauge, 7-cm, Yueh catheter was introduced. An ultrasound image was saved for documentation purposed. The paracentesis was  performed. Unfortunately, the access catheter was inadvertently removed prior to completing the paracentesis however given patient's anxiety during the initial catheter placement the decision was made NOT to replace the catheter at this time and the procedure was terminated. A dressing was applied. The patient tolerated the procedure well without immediate post procedural complication. FINDINGS: A total of approximately 600 cc of serous, non bloody fluid was removed. Samples were sent to the laboratory as requested by the clinical team. IMPRESSION: Successful ultrasound-guided paracentesis yielding 600 cc of serous, non bloody peritoneal fluid. Electronically Signed   By: Sandi Mariscal M.D.   On: 05/23/2020 15:28    Scheduled Meds: . sodium chloride   Intravenous Once  . ascorbic acid  500 mg Oral BID PC  . Chlorhexidine Gluconate Cloth  6 each Topical Daily  . dronabinol  2.5 mg Oral BID AC  . enoxaparin (LOVENOX) injection  70 mg Subcutaneous Q12H  . feeding supplement (ENSURE ENLIVE)  237 mL Oral TID BM  . ferrous [id number] C-folic acid  1 capsule Oral BID PC  . folic acid  1 mg Intravenous Daily  . furosemide  20 mg Intravenous Daily  . Gerhardt's butt cream   Topical TID  . insulin aspart  0-9 Units Subcutaneous Q6H  . methylPREDNISolone (SOLU-MEDROL) injection  80 mg Intravenous Daily  . metoprolol tartrate  25 mg Oral BID  . multivitamin with minerals  1 tablet Oral Daily  . pantoprazole (PROTONIX) IV  40 mg Intravenous Q12H  . sodium chloride flush  3 mL Intravenous Q12H  . Vitamin D (Ergocalciferol)  50,000 Units Oral Q7 days  . zinc sulfate  220 mg Oral Daily   Continuous Infusions: . sodium chloride Stopped (05/13/20 1628)  . meropenem (MERREM) IV 1 g (05/24/20 0842)  . thiamine injection    . TPN ADULT (ION) 63 mL/hr at 05/23/20 1720  LOS: 21 days   Time spent: 60 minutes.  More than 50% of that time was spent in direct patient care and discussing the plan with  nursing staff and wife.  Lorella Nimrod, MD [facility]  If 7PM-7AM, please contact night-coverage [email or web address]  05/24/2020, 1:35 PM   This record has been created using Systems analyst. Errors have been sought and corrected,but may not always be located. Such creation errors do not reflect on the standard of care.

## 2020-05-24 NOTE — Progress Notes (Signed)
4am labs were drawn vis PICC during the infusion of TPN. This was done in error. After RN got results, BMP reordered. During redraw, RN paused TPN and flushed line before drawing labs.

## 2020-05-24 NOTE — Evaluation (Signed)
Physical Therapy Re-Evaluation Patient Details Name: Shane Zurn Sr. MRN: [id number] DOB: 25-Jan-1986 Today's Date: 05/24/2020   History of Present Illness  34 y/o male who has a long history of alcohol abuse who presented to the ED (6/24) in a state of withdrawal and was witnessed to have a seizure; no seizure activity reported since. He was found to have acute alcoholic pancreatitis. Pt has been drinking since he was 18 and per chart, drinks two 12-packs a day.  Clinical Impression  Pt continues to be very weak and consistently confused (apparently tried to get up very unsafely, pulled out some lines, etc) but was able and eager to try and work with PT this afternoon.  He was better able to get to and maintain sitting at EOB than he was when this PT saw him 2 weeks ago, however he continues to be unsteady and unsafe.  On last attempt he was unable to even use LEs effectively enough to get hips off EOB, today we were able to do 3 standing attempts with improvement each time.  His last attempt he was able to maintain upright ~35 seconds with varying amount of constant assist (min to max).  Pt exhausted with the effort, even with seated rest breaks between bouts and heavy cuing and assist with all acts.  Pt hopes to be able to go home, but has a lot of work to do for this to be a viable option; PT still recommending rehab.    Follow Up Recommendations SNF;Supervision/Assistance - 24 hour    Equipment Recommendations   (TBD at next venue of care)    Recommendations for Other Services       Precautions / Restrictions Precautions Precautions: Fall Restrictions Weight Bearing Restrictions: No      Mobility  Bed Mobility Overal bed mobility: Needs Assistance Bed Mobility: Supine to Sit;Sit to Supine     Supine to sit: Mod assist Sit to supine: Mod assist   General bed mobility comments: Pt needed heavy PT assist (simulating bed rail, then pulling) to get to EOB  sitting.  Transfers Overall transfer level: Needs assistance Equipment used: Rolling walker (2 wheeled) Transfers: Sit to/from Stand Sit to Stand: Max assist;Mod assist         General transfer comment: 3x sit to stand, each effort he needed less direct assist to get off surface of bed.  First attempt he was unable to get to fully upright, after rest break second attempt lasted ~5 seconds but he did extend knees and hips fully.  Deliberately put walker lower to be able to use UEs more effectively on last attempt and he was able, with great effort and exhaustion, to maintain standing (with constant mod assist) ~35 seconds.  HR into the 140s on this final attempt  Ambulation/Gait             General Gait Details: unable/unsafe at this time  Stairs            Wheelchair Mobility    Modified Rankin (Stroke Patients Only)       Balance Overall balance assessment: Needs assistance   Sitting balance-Leahy Scale: Fair Sitting balance - Comments: Pt still with some unsteadiness in sitting, but much more able to maintain EOB this date than on eval   Standing balance support: Bilateral upper extremity supported Standing balance-Leahy Scale: Poor Standing balance comment: Pt still very unsteady, weak and limited with standing - needs constant assist and often quite heavy  Pertinent Vitals/Pain Pain Assessment:  (c/o pain with catheter, not rated)    Home Living Family/patient expects to be discharged to:: Unsure Living Arrangements: Spouse/significant other;Children;Non-relatives/Friends Available Help at Discharge: Family (wife could be home 24/7 if needed) Type of Home: House Home Access: Stairs to enter Entrance Stairs-Rails: Right Entrance Stairs-Number of Steps: 3 Home Layout: Two level Home Equipment: None      Prior Function Level of Independence: Independent         Comments: independent in ADL/IADL without AD,  though wife reports that recently he is always leaning on walls/funiture/etc. 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 Pt does not currently work. Per chart, pt drinks two 12-packs a day. Apparently he participated in a rehab group before, but did not like it because it was "too many people." He has had 5-6 falls in the past year, reports they occurred while he was drinking.     Hand Dominance        Extremity/Trunk Assessment   Upper Extremity Assessment Upper Extremity Assessment: Generalized weakness (<3/5 t/o)    Lower Extremity Assessment Lower Extremity Assessment: Generalized weakness (grossly <3/5 t/o )       Communication   Communication: No difficulties  Cognition   Behavior During Therapy: Impulsive Overall Cognitive Status: Difficult to assess                                 General Comments: Pt was able to interact meaningfully, repeated about having to pee despite multiple times telling him he had a catheter in      General Comments      Exercises     Assessment/Plan    PT Assessment Patient needs continued PT services  PT Problem List Decreased strength;Decreased range of motion;Decreased activity tolerance;Decreased balance;Decreased mobility;Decreased coordination;Decreased cognition;Decreased knowledge of use of DME;Decreased safety awareness       PT Treatment Interventions DME instruction;Gait training;Stair training;Therapeutic activities;Functional mobility training;Therapeutic exercise;Balance training;Neuromuscular re-education;Patient/family education;Cognitive remediation    PT Goals (Current goals can be found in the Care Plan section)  Acute Rehab PT Goals Patient Stated Goal: To get better and go home PT Goal Formulation: With patient/family Time For Goal Achievement: 06/07/20 Potential to Achieve Goals: Fair    Frequency Min 2X/week   Barriers to  discharge        Co-evaluation               AM-PAC PT "6 Clicks" Mobility  Outcome Measure Help needed turning from your back to your side while in a flat bed without using bedrails?: A Little Help needed moving from lying on your back to sitting on the side of a flat bed without using bedrails?: A Lot Help needed moving to and from a bed to a chair (including a wheelchair)?: Total Help needed standing up from a chair using your arms (e.g., wheelchair or bedside chair)?: A Lot Help needed to walk in hospital room?: Total Help needed climbing 3-5 steps with a railing? : Total 6 Click Score: 10    End of Session Equipment Utilized During Treatment: Gait belt Activity Tolerance: Patient limited by fatigue Patient left: in bed;with call bell/phone within reach;with nursing/sitter in room;with family/visitor present Nurse Communication: Mobility status PT Visit Diagnosis: Muscle weakness (generalized) (M62.81);Difficulty in walking, not elsewhere classified (R26.2);Unsteadiness on feet (R26.81)    Time: 1856-3149 PT Time  Calculation (min) (ACUTE ONLY): 23 min   Charges:   PT Evaluation $PT Re-evaluation: 1 Re-eval PT Treatments $Therapeutic Activity: 8-22 mins        Malachi Pro, DPT 05/24/2020, 5:14 PM

## 2020-05-24 NOTE — Progress Notes (Signed)
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 Dr. Nelson Chimes made aware as well

## 2020-05-24 NOTE — Progress Notes (Signed)
ID Pt is confused today Was hallucinating Tried to climb out of bed and fell a per the nurse  BP (!) 137/97   Pulse (!) 108   Temp 98.1 F (36.7 C) (Oral)   Resp (!) 21   Ht 5\' 7"  (1.702 m)   Wt 69.7 kg   SpO2 96%   BMI 24.07 kg/m   O/e Chronically ill, emaciated abd soft HS- tachycardia Leg edema - with eryhthematous shin changes    CBC Latest Ref Rng & Units 05/24/2020 05/23/2020 05/23/2020  WBC 4.0 - 10.5 K/uL 16.9(H) - 15.6(H)  Hemoglobin 13.0 - 17.0 g/dL 7.9(L) - 6.9(L)  Hematocrit 39 - 52 % 23.9(L) 20.8(L) 20.7(L)  Platelets 150 - 400 K/uL 130(L) - 88(L)    CMP Latest Ref Rng & Units 05/24/2020 05/24/2020 05/23/2020  Glucose 70 - 99 mg/dL 05/25/2020) 938(H) 829(HB)  BUN 6 - 20 mg/dL 18 18 19   Creatinine 0.61 - 1.24 mg/dL 716(R) ) 6.78(L)  Sodium 135 - 145 mmol/L 132(L) 128(L) 134(L)  Potassium 3.5 - 5.1 mmol/L 4.3 5.9(H) 4.5  Chloride 98 - 111 mmol/L 94(L) 93(L) 95(L)  CO2 22 - 32 mmol/L 30 27 27   Calcium 8.9 - 10.3 mg/dL 8.2(L) 8.3(L) 8.3(L)  Total Protein 6.5 - 8.1 g/dL - 4.7(L) 4.9(L)  Total Bilirubin 0.3 - 1.2 mg/dL - 0.5 0.5  Alkaline Phos 38 - 126 U/L - 137(H) 150(H)  AST 15 - 41 U/L - 80(H) 79(H)  ALT 0 - 44 U/L - 83(H) 87(H)   7/6 BC- NG 7/12 wound culture ng 7/14 ascitic fluid 398  WBC ( 31% N)  Impression/Recommendation ? ?Complicated medical history. 34 year old male with excess EtOH abuse, admitted with altered mental status   Cerebral  venous sinus thrombosis- need to figure out the cause of the thrombosis which can happen after injury, coagulation disorder, autoimmune disorder He says he hit his head on the chest of drwas at home but I dont think it was severe enough to cause that ! With new leg lesions which looks like vasculitis/EM/pemphigoid need to r/o Autoimmune cause . Skin lesions- this does not look like infection Vasculitis VS EM VS Pemhigoidal- Dermatology did  skin biopsy. He has been started on solumedrol  Acute pancreatitis  with pseudocysts multiple  some of them are loculated with thick walled. Concern for infected pseudocyst especially with increasing leukocytosis. Patient is currently on meropenemand leucocytosis much improved Pancreatic asciteswith ascitic fluid having very high amylase and lipase. This is secondary to the pancreatitis or could he have a pancreatic ductfistula? leukocytosis trending down - will DC meropenem as he has completed 9 days and all cultures ( ascitic, blood has been negative) Repeat paracentesis done yesterday- cell count much improved and also culture neg so far  Confusion/hallucination- r/o steroid induced VS wernicke  Severe anemia raises concern for a GI bleed or hemosuccus pancreaticus   Diarrhea secondary to acute pancreatitis/GI bleed. C. difficile is negative.  Discussed the management with care team and his partner

## 2020-05-24 NOTE — Progress Notes (Signed)
   05/24/20 1200  Clinical Encounter Type  Visited With Patient and family together  Visit Type Follow-up  Referral From Chaplain  Consult/Referral To Chaplain  Patient's fiance asked chaplain to call for someone to help patient up. Patient got out the bed and was on the floor. Chaplain talked to fiance and her mother while nurses got helped patient back in bed. Fiance said that she needs her medicine and wanted chaplain to call about it. Chaplain asked if she has called for it and she said no. Chaplain encouraged her to call for her meds. Chaplain also told her that she has to take care of herself in order to tend to patient. Fiance's mother encouraged her to talk with chaplain, but she said she doesn't want to talk. After reentering the room, chaplain encouraged patient to call if he needed something and not try getting out of the bed. He acknowledged hearing chaplain. Chaplain told them of chaplain's availability.

## 2020-05-24 NOTE — Progress Notes (Signed)
Daily Progress Note   Patient Name: Shane Hlavaty Sr.       Date: 05/24/2020 DOB: [birth year]  Age: 34 y.o. MRN#: [id number] Attending Physician: Arnetha Courser, MD Primary Care Physician: [name], MD Admit Date: 05/03/2020  Reason for Consultation/Follow-up: Establishing goals of care  Subjective: Patient is resting in bed. He is confused and is pulling at equipment. Fiance who is HPOA and her mother are at bedside. They state he has been pulling at equipment and fell earlier. They feel he is suffering and discussion was had regarding QOL,and QOL following D/C. We discussed that he would not want a feeding tube and would not want SNF placement. Questions were answered about hospice at home, and about hospice facility. Discussed symptom management needs outside of hospital.  Attending MD entered meeting and discussed continued care and treatment plans.  Patient is a candidate for hospice at home if desired. Given his very poor PO intake, he is currently a candidate for the hospice facility if chosen. If continued agressive care is desired, recommend palliative to follow outpatient.    Recommend Risperdal for confusion/delirium. Recommend discontinuing Marinol as this may be exacerbating symptoms. If concern for depression, recommend Remeron for depression and appetite stimulation.    Palliative medicine will sign off at this time.    Length of Stay: 21  Current Medications: Scheduled Meds:   sodium chloride   Intravenous Once   vitamin C  500 mg Oral BID   Chlorhexidine Gluconate Cloth  6 each Topical Daily   dronabinol  2.5 mg Oral BID AC   enoxaparin (LOVENOX) injection  70 mg Subcutaneous Q12H   feeding supplement (ENSURE ENLIVE)  237 mL Oral TID BM   ferrous  fumarate-b12-vitamic C-folic acid  1 capsule Oral BID PC   folic acid  1 mg Intravenous Daily   furosemide  20 mg Intravenous Daily   Gerhardt's butt cream   Topical TID   insulin aspart  0-9 Units Subcutaneous Q6H   LORazepam  1 mg Intravenous Once   methylPREDNISolone (SOLU-MEDROL) injection  80 mg Intravenous Daily   metoprolol tartrate  25 mg Oral BID   multivitamin with minerals  1 tablet Oral Daily   multivitamin-lutein  1 capsule Oral Daily   pantoprazole (PROTONIX) IV  40 mg Intravenous Q12H   sodium chloride flush  3 mL Intravenous Q12H   [START ON 05/25/2020] vitamin A  10,000 Units Oral Daily   Vitamin D (Ergocalciferol)  50,000 Units Oral Q7 days   [START ON 05/25/2020] zinc sulfate  220 mg Oral Daily    Continuous Infusions:  sodium chloride Stopped (05/13/20 1628)   meropenem (MERREM) IV 1 g (05/24/20 0842)   thiamine 500mg  in normal saline (60ml) IVPB     Followed by   45m ON 05/27/2020] sodium chloride 0.9 % 50 mL with thiamine (B-1) 250 mg infusion      PRN Meds: sodium chloride, HYDROmorphone (DILAUDID) injection, labetalol, magic mouthwash **AND** lidocaine, loperamide, LORazepam, ondansetron **OR** ondansetron (ZOFRAN) IV  Physical Exam Pulmonary:     Effort: Pulmonary effort is normal.  Neurological:     Mental Status: He is alert.             Vital Signs: BP (!) 137/97    Pulse (!) 108    Temp 98.1 F (36.7 C) (Oral)    Resp (!) 21    Ht 5\' 7"  (1.702 m)    Wt 69.7 kg    SpO2 96%    BMI 24.07 kg/m  SpO2: SpO2: 96 % O2 Device: O2 Device: Room Air O2 Flow Rate:    Intake/output summary:   Intake/Output Summary (Last 24 hours) at 05/24/2020 1453 Last data filed at 05/24/2020 1310 Gross per 24 hour  Intake 3716.98 ml  Output 3600 ml  Net 116.98 ml   LBM: Last BM Date: 05/24/20 Baseline Weight: Weight: 63.5 kg Most recent weight: Weight: 69.7 kg       Palliative Assessment/Data:    Flowsheet Rows     Most Recent Value   Intake Tab  Referral Department Hospitalist  Unit at Time of Referral Med/Surg Unit  Palliative Care Primary Diagnosis Other (Comment)  [Acute alcoholic pancreatitis]  Date Notified 05/14/20  Palliative Care Type New Palliative care  Reason for referral Clarify Goals of Care  Date of Admission 05/03/20  Date first seen by Palliative Care 05/16/20  # of days Palliative referral response time 2 Day(s)  # of days IP prior to Palliative referral 11  Clinical Assessment  Psychosocial & Spiritual Assessment  Palliative Care Outcomes      Patient Active Problem List   Diagnosis Date Noted   Protein-calorie malnutrition, severe 05/22/2020   Hypotension    Ascites    Acute alcoholic pancreatitis 05/10/2020   Acute metabolic encephalopathy 05/10/2020   Severe malnutrition (HCC) 05/10/2020   Sinus tachycardia 05/10/2020   Alcohol withdrawal seizure with delirium (HCC) 05/03/2020   Hyponatremia 05/03/2020   Hypokalemia 05/03/2020   AKI (acute kidney injury) (HCC) 05/03/2020   Elevated CK 05/03/2020   Heme positive stool 05/03/2020   Thrombocytopenia (HCC) 05/03/2020    Palliative Care Assessment & Plan   Recommendations/Plan: Full scope care. Family's questions answered about hospice at home and at the facility. Patient is hospice appropriate at this time if chosen.  If patient continues aggressive care, recommend palliative to follow at D/C.   Will sign off at this time.    Code Status:    Code Status Orders  (From admission, onward)         Start     Ordered   05/17/20 1603  Do not attempt resuscitation (DNR)  Continuous       Question Answer Comment  In the event of cardiac or respiratory ARREST Do not call a code blue   In the event of cardiac  or respiratory ARREST Do not perform Intubation, CPR, defibrillation or ACLS   In the event of cardiac or respiratory ARREST Use medication by any route, position, wound care, and other measures to relive pain  and suffering. May use oxygen, suction and manual treatment of airway obstruction as needed for comfort.   Comments MOST form on chart.      05/17/20 1602        Code Status History    Date Active Date Inactive Code Status Order ID Comments User Context   05/03/2020 2155 05/17/2020 1602 Full Code [id number]  Charlsie Quest, MD ED   05/03/2020 2016 05/03/2020 2155 Full Code [id number]  Willy Eddy, MD ED   Advance Care Planning Activity      Prognosis: Poor overall.    Thank you for allowing the Palliative Medicine Team to assist in the care of this patient.   Total Time 1:10-2:30 Prolonged Time Billed  yes      Greater than 50%  of this time was spent counseling and coordinating care related to the above assessment and plan.  Morton Stall, NP  Please contact Palliative Medicine Team phone at [phone] for questions and concerns.

## 2020-05-24 NOTE — Progress Notes (Signed)
Found patient sitting up in bed. Patient had already pulled off cardiac monitor leads and PICC line and was working on pulling out foley. RN instructed patient to leave foley alone. Patient stated, "I need to go to the hospital. I'm hurting." RN reoriented patient, telling him that he was in the hospital. Straightened up patient with help of another RN. Gave PRN pain medication. See MAR. Patient began crying. Patient stated, "I don't want to do this anymore." Charge Rn consulted. Placed patient back on cardiac monitor.

## 2020-05-24 NOTE — Progress Notes (Signed)
At approximately 1245 patient stood and legs folded underneath him. No injuries noted, notified Dr. Nelson Chimes. Family at bedside at time of fall, did not attempt to stop patient from standing up.

## 2020-05-24 NOTE — Progress Notes (Signed)
Confusion seems to wax and wane, This afternoon he is clearer and calmer. Worked with PT, but refuses to eat still.

## 2020-05-25 DIAGNOSIS — K863 Pseudocyst of pancreas: Secondary | ICD-10-CM

## 2020-05-25 DIAGNOSIS — R188 Other ascites: Secondary | ICD-10-CM

## 2020-05-25 DIAGNOSIS — K859 Acute pancreatitis without necrosis or infection, unspecified: Secondary | ICD-10-CM

## 2020-05-25 DIAGNOSIS — D649 Anemia, unspecified: Secondary | ICD-10-CM

## 2020-05-25 DIAGNOSIS — G08 Intracranial and intraspinal phlebitis and thrombophlebitis: Secondary | ICD-10-CM

## 2020-05-25 DIAGNOSIS — M793 Panniculitis, unspecified: Secondary | ICD-10-CM

## 2020-05-25 DIAGNOSIS — F1010 Alcohol abuse, uncomplicated: Secondary | ICD-10-CM

## 2020-05-25 DIAGNOSIS — E512 Wernicke's encephalopathy: Secondary | ICD-10-CM

## 2020-05-25 DIAGNOSIS — R197 Diarrhea, unspecified: Secondary | ICD-10-CM

## 2020-05-25 DIAGNOSIS — R4182 Altered mental status, unspecified: Secondary | ICD-10-CM

## 2020-05-25 LAB — COMPREHENSIVE METABOLIC PANEL
ALT: 112 U/L — ABNORMAL HIGH (ref 0–44)
AST: 89 U/L — ABNORMAL HIGH (ref 15–41)
Albumin: 1.7 g/dL — ABNORMAL LOW (ref 3.5–5.0)
Alkaline Phosphatase: 138 U/L — ABNORMAL HIGH (ref 38–126)
Anion gap: 9 (ref 5–15)
BUN: 19 mg/dL (ref 6–20)
CO2: 28 mmol/L (ref 22–32)
Calcium: 8.3 mg/dL — ABNORMAL LOW (ref 8.9–10.3)
Chloride: 97 mmol/L — ABNORMAL LOW (ref 98–111)
Creatinine, Ser: 0.6 mg/dL — ABNORMAL LOW (ref 0.61–1.24)
GFR calc Af Amer: >60 mL/min (ref >60)
GFR calc non Af Amer: >60 mL/min (ref >60)
Glucose, Bld: 86 mg/dL (ref 70–99)
Potassium: 3.9 mmol/L (ref 3.5–5.1)
Sodium: 134 mmol/L — ABNORMAL LOW (ref 135–145)
Total Bilirubin: 0.6 mg/dL (ref 0.3–1.2)
Total Protein: 5.1 g/dL — ABNORMAL LOW (ref 6.5–8.1)

## 2020-05-25 LAB — CBC
HCT: 25.7 % — ABNORMAL LOW (ref 39.0–52.0)
Hemoglobin: 8.5 g/dL — ABNORMAL LOW (ref 13.0–17.0)
MCH: 30.6 pg (ref 26.0–34.0)
MCHC: 33.1 g/dL (ref 30.0–36.0)
MCV: 92.4 fL (ref 80.0–100.0)
Platelets: 178 10*3/uL (ref 150–400)
RBC: 2.78 MIL/uL — ABNORMAL LOW (ref 4.22–5.81)
RDW: 14.8 % (ref 11.5–15.5)
WBC: 10.3 10*3/uL (ref 4.0–10.5)
nRBC: 0 % (ref 0.0–0.2)

## 2020-05-25 LAB — PROTEIN C DEFICIENCY PROFILE
Protein C Activity: 70 % — ABNORMAL LOW (ref 73–180)
Protein C, Total: 56 % — ABNORMAL LOW (ref 60–150)

## 2020-05-25 LAB — AEROBIC CULTURE W GRAM STAIN (SUPERFICIAL SPECIMEN)
Culture: NO GROWTH
Gram Stain: NONE SEEN

## 2020-05-25 LAB — MAGNESIUM: Magnesium: 1.6 mg/dL — ABNORMAL LOW (ref 1.7–2.4)

## 2020-05-25 LAB — CYTOLOGY - NON PAP

## 2020-05-25 LAB — LUPUS ANTICOAGULANT PANEL
DRVVT: 51.2 s — ABNORMAL HIGH (ref 0.0–47.0)
PTT Lupus Anticoagulant: 49.4 s (ref 0.0–51.9)

## 2020-05-25 LAB — PHOSPHORUS: Phosphorus: 5 mg/dL — ABNORMAL HIGH (ref 2.5–4.6)

## 2020-05-25 LAB — DRVVT MIX: dRVVT Mix: 39.4 s (ref 0.0–40.4)

## 2020-05-25 LAB — GLUCOSE, CAPILLARY
Glucose-Capillary: 122 mg/dL — ABNORMAL HIGH (ref 70–99)
Glucose-Capillary: 84 mg/dL (ref 70–99)

## 2020-05-25 MED ORDER — DULOXETINE HCL 20 MG PO CPEP
20.0000 mg | ORAL_CAPSULE | Freq: Every day | ORAL | Status: DC
  Administered 2020-05-25 – 2020-05-31 (×7): 20 mg via ORAL
  Filled 2020-05-25 (×8): qty 1

## 2020-05-25 MED ORDER — ALBUMIN HUMAN 25 % IV SOLN
12.5000 g | Freq: Every day | INTRAVENOUS | Status: DC
  Administered 2020-05-25 – 2020-05-30 (×6): 12.5 g via INTRAVENOUS
  Filled 2020-05-25 (×8): qty 50

## 2020-05-25 MED ORDER — OLANZAPINE 5 MG PO TBDP
5.0000 mg | ORAL_TABLET | Freq: Every day | ORAL | Status: DC
  Administered 2020-05-25 – 2020-05-30 (×6): 5 mg via ORAL
  Filled 2020-05-25 (×7): qty 1

## 2020-05-25 MED ORDER — MAGNESIUM SULFATE 4 GM/100ML IV SOLN
4.0000 g | Freq: Once | INTRAVENOUS | Status: AC
  Administered 2020-05-25: 4 g via INTRAVENOUS
  Filled 2020-05-25: qty 100

## 2020-05-25 MED ORDER — RIVAROXABAN 20 MG PO TABS
20.0000 mg | ORAL_TABLET | Freq: Every day | ORAL | Status: DC
  Administered 2020-05-25 – 2020-05-30 (×6): 20 mg via ORAL
  Filled 2020-05-25 (×8): qty 1

## 2020-05-25 NOTE — Progress Notes (Signed)
ID Pt doing better today Not confused Says he had breakfast  Patient Vitals for the past 24 hrs:  BP Temp Temp src Pulse Resp SpO2 Weight  05/25/20 0937 130/89 -- -- (!) 113 -- -- --  05/25/20 0800 119/79 98.2 F (36.8 C) -- -- (!) 21 -- --  05/25/20 0700 118/73 -- -- 94 (!) 25 97 % --  05/25/20 0600 (!) 134/91 -- -- 94 (!) 27 95 % --  05/25/20 0500 108/74 -- -- -- (!) 25 -- --  05/25/20 0400 (!) 121/95 -- -- -- (!) 23 -- --  05/25/20 0300 (!) 122/91 -- -- 98 (!) 22 92 % --  05/25/20 0200 115/85 (!) 97.3 F (36.3 C) Axillary 93 (!) 23 95 % 67.9 kg  05/25/20 0100 117/77 -- -- 99 (!) 26 93 % --  05/25/20 0000 122/75 -- -- (!) 101 (!) 23 93 % --  05/24/20 2300 119/77 -- -- -- (!) 28 -- --  05/24/20 2200 132/80 -- -- (!) 115 (!) 23 95 % --  05/24/20 2100 138/84 -- -- -- -- -- --  05/24/20 2012 139/87 (!) 97.4 F (36.3 C) Axillary (!) 111 -- 98 % --  05/24/20 1900 -- -- -- 100 (!) 23 96 % --  05/24/20 1700 -- -- -- -- 20 -- --  05/24/20 1600 -- 98.8 F (37.1 C) -- -- (!) 21 -- --   O/e Awake and alert Chronically ill Chest decreased air entry bases [id number] abd soft distension Legs edema less            CBC Latest Ref Rng & Units 05/25/2020 05/24/2020 05/23/2020  WBC 4.0 - 10.5 K/uL 10.3 16.9(H) -  Hemoglobin 13.0 - 17.0 g/dL 8.8(F) 7.9(L) -  Hematocrit 39 - 52 % 25.7(L) 23.9(L) 20.8(L)  Platelets 150 - 400 K/uL 178 130(L) -     CMP Latest Ref Rng & Units 05/25/2020 05/24/2020 05/24/2020  Glucose 70 - 99 mg/dL 86 027(X) 412(IN)  BUN 6 - 20 mg/dL 19 18 18   Creatinine 0.61 - 1.24 mg/dL ) 8.67(E) 7.20(N)  Sodium 135 - 145 mmol/L 134(L) 132(L) 128(L)  Potassium 3.5 - 5.1 mmol/L 3.9 4.3 5.9(H)  Chloride 98 - 111 mmol/L 97(L) 94(L) 93(L)  CO2 22 - 32 mmol/L 28 30 27   Calcium 8.9 - 10.3 mg/dL 8.3(L) 8.2(L) 8.3(L)  Total Protein 6.5 - 8.1 g/dL 5.1(L) - 4.7(L)  Total Bilirubin 0.3 - 1.2 mg/dL 0.6 - 0.5  Alkaline Phos 38 - 126 U/L 138(H) - 137(H)  AST 15 - 41 U/L  89(H) - 80(H)  ALT 0 - 44 U/L 112(H) - 83(H)    Vitamin c level 0.1 ( low) Zinc 49 ( 44-115)  Impression/Recommendation ? ?Complicated medical history. 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 Concern was for infected pseudocyst especially with increasing leukocytosis. Patient completed 9 days of meropenem on 05/24/20 and leucocytosis has resolved  Expect it to go up because of solumedrol which he received x 3 doses  Pancreatic asciteswith ascitic fluid having very high amylase and lipase. This is secondary to the pancreatitis or could he have a pancreatic ductfistula?   Confusion/hallucination-resolved- pt was started on thiamine for wernicke encephalopathy  ETOH  abuse Transaminitis  Low Vitc level- being supplemented   Severe anemia-- s/p PRBC  raises concern for a GI bleed or hemosuccus pancreaticus-  Paracentesis no bleed  Anasarca- combination of fluids, hypoalbuminemia, anemia- received furosemide  Diarrhea secondary to acute pancreatitis/GI bleed. C. difficile is negative.  Discussed the management with care team and his partner ID will sign off- call if needed

## 2020-05-25 NOTE — Progress Notes (Signed)
Much clearer mentally today, no attempts to exit bed, appetite is improved, actually ate breakfast, lunch and dinner.

## 2020-05-25 NOTE — Consult Note (Addendum)
ANTICOAGULATION CONSULT NOTE  Pharmacy Consult for Rivaroxaban Indication: cerebral venous thrombosis  Patient Measurements: Height: 5\' 7"  (170.2 cm) Weight: 67.9 kg (149 lb 11.1 oz) IBW/kg (Calculated) : 66.1  Vital Signs: Temp: 98.2 F (36.8 C) (07/16 0800) BP: 130/89 (07/16 0937) Pulse Rate: 113 (07/16 0937)  Labs: Recent Labs    05/23/20 0458 05/23/20 0458 05/23/20 1229 05/24/20 0409 05/24/20 0550 05/25/20 0312  HGB 6.9*   < >  --  7.9*  --  8.5*  HCT 20.7*   < > 20.8* 23.9*  --  25.7*  PLT 88*  --   --  130*  --  178  CREATININE 0.52*   < >  --  0.51* 0.50* 0.60*   < > = values in this interval not displayed.    Estimated Creatinine Clearance: 121.6 mL/min (A) (by C-G formula based on SCr of 0.6 mg/dL (L)).   Medical History: Past Medical History:  Diagnosis Date  . Alcohol use disorder, severe, dependence (HCC)    Assessment: Pharmacy initially consulted for heparin infusion dosing and monitoring for 34 yo male for cerebral venous thrombosis. Patient received heparin infusion 7/4 - 7/13 and was transitioned to enoxaparin 7/13 - 7/16. Now planning to transition patient from therapeutic enoxaparin to rivaroxaban. H&H, platelets trending up since blood transfusion.  Given bleeding risk and duration of therapy received with heparin / lovenox will start rivaroxaban 20 mg daily and forego initial 15 mg BID x 21 day dosing. Discussed with MD.   Goal of Therapy:  Monitor platelets by anticoagulation protocol   Plan:  --Last dose of enoxaparin given this morning. Order discontinued  --Start rivaroxaban 20 mg daily this evening --CBC per protocol  06-19-1976 05/25/2020 2:15 PM

## 2020-05-25 NOTE — Consult Note (Signed)
[facility] Consult   Reason for Consult:   Depression, mood issues ETOH ism  Dual diagnosis personality problems  Referring Physician:  Tilman NeatSumayya MD  Patient Identification: Shane SergeantLarry Dejager Sr. MRN:  [id number] Principal Psychiatric Diagnosis: Acute alcoholic pancreatitis Medical Diagnosis:  Principal Problem:   Acute alcoholic pancreatitis Active Problems:   Alcohol withdrawal seizure with delirium (HCC)   Hyponatremia   Hypokalemia   AKI (acute kidney injury) (HCC)   Elevated CK   Heme positive stool   Thrombocytopenia (HCC)   Acute metabolic encephalopathy   Severe malnutrition (HCC)   Sinus tachycardia   Ascites   Hypotension   Protein-calorie malnutrition, severe  Major depression severe recurrent Generalized anxiety  ETOH dependence, withdrawal intoxication Medical sequelae of ETOH including malnutrition pancreatitis       Ttal Time spent with patient:  45   SUBJECTIVE  Chief Complaint:   I do not know   Shane SergeantLarry Meints Sr. is a 34 y.o. male patient admitted with severe ETOH related pancreatitis and issues around ETOH and its withdrawal and nutrition problems   Found to have dual diagnosis with major depression and anxiety   No previous treatment or followup with psychiatry   Depressed mood, weight loss, appetite loss, loss of activities, anhedonia, poor sleep, lack of energy motivation, concentration attention  Enthusiasm, poor self esteem ---  Mixed with generalized anxiety, worry, nervousness tension frustration, frozen and numb feelings, panic fear dread and doom  Mild suspicious paranoid feelings,    History of related mood swings, ups and downs, highs and lows ---  Willing to take meds for above.    Previously was temp on Remeron but there is data to show how Remeron with pancreatic issues should generally be avoided   He feels depression and anxiety are equal  Blood pressure 108/71, pulse 97, temperature 98.5 F (36.9 C), resp. rate (!)  24, height 5\' 7"  (1.702 m), weight 67.9 kg, SpO2 94 %.Body mass index is 23.45 kg/m.  HPI:  See above   Prior Inpatient Therapy:  none  Prior Outpatient Therapy Visits:   None   Previous Psychiatric Medications:   Current Facility-Administered Medications  Medication Dose Route Frequency Provider Last Rate Last Admin  . 0.9 %  sodium chloride infusion (Manually program via Guardrails IV Fluids)   Intravenous Once Arnetha CourserAmin, Sumayya, MD   Held at 05/23/20 1635  . 0.9 %  sodium chloride infusion   Intravenous PRN Esaw GrandchildGriffith, Kelly A, DO   Stopped at 05/13/20 1628  . albumin human 25 % solution 12.5 g  12.5 g Intravenous Daily Arnetha CourserAmin, Sumayya, MD 60 mL/hr at 05/25/20 1156 12.5 g at 05/25/20 1156  . ascorbic acid (VITAMIN C) tablet 500 mg  500 mg Oral BID Arnetha CourserAmin, Sumayya, MD   500 mg at 05/25/20 0820  . Chlorhexidine Gluconate Cloth 2 % PADS 6 each  6 each Topical Daily Esaw GrandchildGriffith, Kelly A, DO   6 each at 05/22/20 2208  . dronabinol (MARINOL) capsule 2.5 mg  2.5 mg Oral BID AC Arnetha CourserAmin, Sumayya, MD   2.5 mg at 05/25/20 1537  . DULoxetine (CYMBALTA) DR capsule 20 mg  20 mg Oral Daily Roselind Messierao, Manila Rommel, MD      . feeding supplement (ENSURE ENLIVE) (ENSURE ENLIVE) liquid 237 mL  237 mL Oral TID BM Arnetha CourserAmin, Sumayya, MD   237 mL at 05/25/20 0932  . ferrous fumarate-b12-vitamic C-folic acid (TRINSICON / FOLTRIN) capsule 1 capsule  1 capsule Oral BID PC Arnetha CourserAmin, Sumayya, MD   1  capsule at 05/25/20 1714  . folic acid injection 1 mg  1 mg Intravenous Daily Arnetha Courser, MD   1 mg at 05/25/20 0933  . Gerhardt's butt cream   Topical TID Esaw Grandchild A, DO   Given at 05/25/20 1538  . haloperidol lactate (HALDOL) injection 2 mg  2 mg Intravenous Q6H PRN Manuela Schwartz, NP   2 mg at 05/24/20 2144  . HYDROmorphone (DILAUDID) injection 0.5 mg  0.5 mg Intravenous Q2H PRN Dorcas Carrow, MD   0.5 mg at 05/25/20 0825  . insulin aspart (novoLOG) injection 0-9 Units  0-9 Units Subcutaneous Q6H Lowella Bandy, RPH   1 Units at  05/25/20 0024  . labetalol (NORMODYNE) injection 10 mg  10 mg Intravenous Q4H PRN Esaw Grandchild A, DO   10 mg at 05/15/20 1909  . magic mouthwash  5 mL Oral TID PRN Delfino Lovett, MD       And  . lidocaine (XYLOCAINE) 2 % viscous mouth solution 5 mL  5 mL Mouth/Throat TID PRN Delfino Lovett, MD      . loperamide (IMODIUM) capsule 2 mg  2 mg Oral PRN Esaw Grandchild A, DO   2 mg at 05/21/20 1250  . LORazepam (ATIVAN) injection 0.5 mg  0.5 mg Intravenous Q6H PRN Esaw Grandchild A, DO   0.5 mg at 05/24/20 1936  . metoprolol tartrate (LOPRESSOR) tablet 25 mg  25 mg Oral BID Arnetha Courser, MD   25 mg at 05/25/20 1660  . multivitamin with minerals tablet 1 tablet  1 tablet Oral Daily Arnetha Courser, MD   1 tablet at 05/25/20 0937  . multivitamin-lutein (OCUVITE-LUTEIN) capsule 1 capsule  1 capsule Oral Daily Arnetha Courser, MD   1 capsule at 05/25/20 1538  . OLANZapine zydis (ZYPREXA) disintegrating tablet 5 mg  5 mg Oral QHS Roselind Messier, MD      . ondansetron [facility]) tablet 4 mg  4 mg Oral Q6H PRN Charlsie Quest, MD       Or  . ondansetron (ZOFRAN) injection 4 mg  4 mg Intravenous Q6H PRN Charlsie Quest, MD   4 mg at 05/22/20 1713  . pantoprazole (PROTONIX) injection 40 mg  40 mg Intravenous Q12H Lowella Bandy, RPH   40 mg at 05/25/20 6301  . rivaroxaban (XARELTO) tablet 20 mg  20 mg Oral Q supper Tressie Ellis, RPH   20 mg at 05/25/20 1714  . thiamine 500mg  in normal saline (7ml) IVPB  500 mg Intravenous TID 45m, MD 100 mL/hr at 05/25/20 1539 500 mg at 05/25/20 1539   Followed by  . [START ON 05/27/2020] sodium chloride 0.9 % 50 mL with thiamine (B-1) 250 mg infusion   Intravenous Daily Amin, 05/29/2020, MD      . sodium chloride flush (NS) 0.9 % injection 3 mL  3 mL Intravenous Q12H Tilman Neat, MD   3 mL at 05/25/20 0938  . vitamin A capsule 10,000 Units  10,000 Units Oral Daily 05/27/20, MD   10,000 Units at 05/25/20 1243  . Vitamin D (Ergocalciferol) (DRISDOL) capsule  50,000 Units  50,000 Units Oral Q7 days 05/27/20, MD   50,000 Units at 05/23/20 1045  . zinc sulfate capsule 220 mg  220 mg Oral Daily 05/25/20, MD   220 mg at 05/25/20 1714    Past Psychiatric History:   Past Medical History:  Past Medical History:  Diagnosis Date  . Alcohol use disorder, severe, dependence (HCC)  History reviewed. No pertinent surgical history.  Allergies  Allergen Reactions  . Sudafed [Pseudoephedrine]     History per pt of this being linked with his seizures    Family Medical and Psychiatric History:   Family History  Problem Relation Age of Onset  . Alcohol abuse Mother     Social History:   Social History   Socioeconomic History  . Marital status: Divorced    Spouse name: Not on file  . Number of children: Not on file  . Years of education: Not on file  . Highest education level: Not on file  Occupational History  . Not on file  Tobacco Use  . Smoking status: Current Every Day Smoker  . Smokeless tobacco: Never Used  Substance and Sexual Activity  . Alcohol use: Yes    Comment: 24 beers/day since age 47.  . Drug use: Not Currently  . Sexual activity: Not on file  Other Topics Concern  . Not on file  Social History Narrative  . Not on file   Social Determinants of Health   Financial Resource Strain:   . Difficulty of Paying Living Expenses:   Food Insecurity:   . Worried About Programme researcher, broadcasting/film/video in the Last Year:   . Barista in the Last Year:   Transportation Needs:   . Freight forwarder (Medical):   Marland Kitchen Lack of Transportation (Non-Medical):   Physical Activity:   . Days of Exercise per Week:   . Minutes of Exercise per Session:   Stress:   . Feeling of Stress :   Social Connections:   . Frequency of Communication with Friends and Family:   . Frequency of Social Gatherings with Friends and Family:   . Attends Religious Services:   . Active Member of Clubs or Organizations:   . Attends Tax inspector Meetings:   Marland Kitchen Marital Status:    Specify valuables returned: Diannia Ruder  Educational History:   HS and part beyond  Developmental History    Birth Complications: none   Milestones: abuse and misuse interfered   Learning Disabilities: not clear   Speech Problems:  None   Substance/Drug History:   Long standing chronic ETOH ism,  No previous care or treatment  Social History   Substance and Sexual Activity  Alcohol Use Yes   Comment: 24 beers/day since age 73.    Social History   Substance and Sexual Activity  Drug Use Not Currently    Court/Legal Issues:   None currently   LABS  Results for orders placed or performed during the hospital encounter of 05/03/20 (from the past 48 hour(s))  Glucose, capillary     Status: Abnormal   Collection Time: 05/24/20 12:01 AM  Result Value Ref Range   Glucose-Capillary 244 (H) 70 - 99 mg/dL    Comment: Glucose reference range applies only to samples taken after fasting for at least 8 hours.  Comprehensive metabolic panel     Status: Abnormal   Collection Time: 05/24/20  4:09 AM  Result Value Ref Range   Sodium 128 (L) 135 - 145 mmol/L   Potassium 5.9 (H) 3.5 - 5.1 mmol/L   Chloride 93 (L) 98 - 111 mmol/L   CO2 27 22 - 32 mmol/L   Glucose, Bld 939 (HH) 70 - 99 mg/dL    Comment: CRITICAL RESULT CALLED TO, READ BACK BY AND VERIFIED WITH ALEKSEY FRASER  0524 ON 05/24/2020 RH Glucose reference range applies only to  samples taken after fasting for at least 8 hours.    BUN 18 6 - 20 mg/dL   Creatinine, Ser 4.31 (L) 0.61 - 1.24 mg/dL   Calcium 8.3 (L) 8.9 - 10.3 mg/dL   Total Protein 4.7 (L) 6.5 - 8.1 g/dL   Albumin 1.5 (L) 3.5 - 5.0 g/dL   AST 80 (H) 15 - 41 U/L   ALT 83 (H) 0 - 44 U/L   Alkaline Phosphatase 137 (H) 38 - 126 U/L   Total Bilirubin 0.5 0.3 - 1.2 mg/dL   GFR calc non Af Amer >60 >60 mL/min   GFR calc Af Amer >60 >60 mL/min   Anion gap 8 5 - 15    Comment: Performed at [facility], [address] [place], [place] [address]  Magnesium     Status: Abnormal   Collection Time: 05/24/20  4:09 AM  Result Value Ref Range   Magnesium 2.7 (H) 1.7 - 2.4 mg/dL    Comment: Performed at [facility], [address] [place], [place] [address]  Phosphorus     Status: None   Collection Time: 05/24/20  4:09 AM  Result Value Ref Range   Phosphorus 4.5 2.5 - 4.6 mg/dL    Comment: Performed at [facility], [address] [place], [place] [address]  CBC     Status: Abnormal   Collection Time: 05/24/20  4:09 AM  Result Value Ref Range   WBC 16.9 (H) 4.0 - 10.5 K/uL   RBC 2.44 (L) 4.22 - 5.81 MIL/uL   Hemoglobin 7.9 (L) 13.0 - 17.0 g/dL   HCT 67.1 (L) 39 - 52 %   MCV 98.0 80.0 - 100.0 fL   MCH 32.4 26.0 - 34.0 pg   MCHC 33.1 30.0 - 36.0 g/dL   RDW 24.5 (H) 80.9 - 98.3 %   Platelets 130 (L) 150 - 400 K/uL   nRBC 0.0 0.0 - 0.2 %    Comment: Performed at [facility], [address] [place], [place] [address]  Glucose, capillary     Status: Abnormal   Collection Time: 05/24/20  5:35 AM  Result Value Ref Range   Glucose-Capillary 186 (H) 70 - 99 mg/dL    Comment: Glucose reference range applies only to samples taken after fasting for at least 8 hours.  Basic metabolic panel     Status: Abnormal   Collection Time: 05/24/20  5:50 AM  Result Value Ref Range   Sodium 132 (L) 135 - 145 mmol/L   Potassium 4.3 3.5 - 5.1 mmol/L   Chloride 94 (L) 98 - 111 mmol/L   CO2 30 22 - 32 mmol/L   Glucose, Bld 174 (H) 70 - 99 mg/dL    Comment: Glucose reference range applies only to samples taken after fasting for at least 8 hours.   BUN 18 6 - 20 mg/dL   Creatinine, Ser 5.39 (L) 0.61 - 1.24 mg/dL   Calcium 8.2 (L) 8.9 - 10.3 mg/dL   GFR calc non Af Amer >60 >60 mL/min   GFR calc Af Amer >60 >60 mL/min   Anion gap 8 5 - 15    Comment: Performed at [facility], [address] [place], [place] [address]  Glucose, capillary     Status: Abnormal    Collection Time: 05/24/20 11:18 AM  Result Value Ref Range   Glucose-Capillary 127 (H) 70 - 99 mg/dL    Comment: Glucose reference range applies only to samples taken  after fasting for at least 8 hours.  Glucose, capillary     Status: Abnormal   Collection Time: 05/25/20 12:21 AM  Result Value Ref Range   Glucose-Capillary 122 (H) 70 - 99 mg/dL    Comment: Glucose reference range applies only to samples taken after fasting for at least 8 hours.  CBC     Status: Abnormal   Collection Time: 05/25/20  3:12 AM  Result Value Ref Range   WBC 10.3 4.0 - 10.5 K/uL   RBC 2.78 (L) 4.22 - 5.81 MIL/uL   Hemoglobin 8.5 (L) 13.0 - 17.0 g/dL   HCT 06.2 (L) 39 - 52 %   MCV 92.4 80.0 - 100.0 fL   MCH 30.6 26.0 - 34.0 pg   MCHC 33.1 30.0 - 36.0 g/dL   RDW 69.4 85.4 - 62.7 %   Platelets 178 150 - 400 K/uL   nRBC 0.0 0.0 - 0.2 %    Comment: Performed at [facility], [address] [place], [place] [address]  Phosphorus     Status: Abnormal   Collection Time: 05/25/20  3:12 AM  Result Value Ref Range   Phosphorus 5.0 (H) 2.5 - 4.6 mg/dL    Comment: Performed at [facility], [address], [place], [place] [address]  Magnesium     Status: Abnormal   Collection Time: 05/25/20  3:12 AM  Result Value Ref Range   Magnesium 1.6 (L) 1.7 - 2.4 mg/dL    Comment: Performed at [facility], [address] [place], [place] [address]  Comprehensive metabolic panel     Status: Abnormal   Collection Time: 05/25/20  3:12 AM  Result Value Ref Range   Sodium 134 (L) 135 - 145 mmol/L   Potassium 3.9 3.5 - 5.1 mmol/L   Chloride 97 (L) 98 - 111 mmol/L   CO2 28 22 - 32 mmol/L   Glucose, Bld 86 70 - 99 mg/dL    Comment: Glucose reference range applies only to samples taken after fasting for at least 8 hours.   BUN 19 6 - 20 mg/dL   Creatinine, Ser 1.69 (L) 0.61 - 1.24 mg/dL   Calcium 8.3 (L) 8.9 - 10.3 mg/dL   Total Protein 5.1 (L) 6.5 - 8.1 g/dL   Albumin 1.7 (L) 3.5 - 5.0 g/dL    AST 89 (H) 15 - 41 U/L   ALT 112 (H) 0 - 44 U/L   Alkaline Phosphatase 138 (H) 38 - 126 U/L   Total Bilirubin 0.6 0.3 - 1.2 mg/dL   GFR calc non Af Amer >60 >60 mL/min   GFR calc Af Amer >60 >60 mL/min   Anion gap 9 5 - 15    Comment: Performed at [facility], [address] [place], [place] [address]  Glucose, capillary     Status: None   Collection Time: 05/25/20  5:32 AM  Result Value Ref Range   Glucose-Capillary 84 70 - 99 mg/dL    Comment: Glucose reference range applies only to samples taken after fasting for at least 8 hours.    PHYSICAL EXAM  Physical Exam  SYSTEMS REVIEW  Review of Systems  MENTAL STATUS  General Appearance:  Thin sickly gaunt slow, poor rapport and eye contact   Rapport/Eye Contact:  poo r  Orientation:  Times four okay   Consciousness:  Not clouded or fluctuant  Concentration:  Fair   Mood/Affect:  Depressed and anxious   Language/Speech:  These parameters are slow  Rate:  Volume:  Fluency:  Articulation:  Thought Process:  Illogical vague  Thought Content:  Depressive themes, victim hood and all   SUICIDAL/HOMICIDAL----not at this time   Risk to Self:  risk of clinical deterioration if not treated  Risk to Others:  none   Memory/Recall:  ---okay with these parameters through general questions Immediate:  Recent:  Remote:  Fund of Knowledge/Intelligence/Cognition:  Fair to poor   Below are all poor  Judgement:   Insight:   Reliability:    MOVEMENTS  Psychomotor Activity:  Slow and retarded Handedness:    Strength & Muscle Tone: --weak  Gait & Station: in ICU  Patient leans:   Akathisia: none   AIMS (if indicated):   neg  Assets:  Caring wife and family  Liabilities:  Dual diagnosis untreated addictive personality problems PTSD   ADL'S:  All below are limited   Sleep:    Appetite:    Formulation:    Dual diagnosis patient seeks treatment for depression and anxiety severe ETOH issues,  personality chronic issues and unresolved trauma and PTSD   Treatment Plan Summary:  Begin use of low dose zyprexa along with cymbalta ----possible supportive rounds encourage inpatient visit after recovery   Disposition:   IVC for now   Roselind Messier, MD 05/25/2020 5:26 PM

## 2020-05-25 NOTE — TOC Initial Note (Signed)
Transition of Care (TOC) - Initial/Assessment Note    Patient Details  Name: Shane Jenkins. MRN: [id number] Date of Birth: [birth year]  Transition of Care [facility]) CM/SW Contact:    Marina Goodell Phone Number: [phone] 05/25/2020, 5:17 PM  Clinical Narrative:                  CSW spoke with patient and family about care transition.  Patient and patient's fiance stated they would like the patient to go to a SNF or ALF for rehab.  CSW explained placement process patient and fiance verbalized understanding.  CSW stated she would keep family posted placement process.  Expected Discharge Plan: Home/Self Care Barriers to Discharge: Continued Medical Work up   Patient Goals and CMS Choice        Expected Discharge Plan and Services Expected Discharge Plan: Home/Self Care   Discharge Planning Services: CM Consult                                          Prior Living Arrangements/Services                       Activities of Daily Living Home Assistive Devices/Equipment: None ADL Screening (condition at time of admission) Patient's cognitive ability adequate to safely complete daily activities?: No Is the patient deaf or have difficulty hearing?: No Does the patient have difficulty seeing, even when wearing glasses/contacts?: No Does the patient have difficulty concentrating, remembering, or making decisions?: No Patient able to express need for assistance with ADLs?: No Does the patient have difficulty dressing or bathing?: Yes Independently performs ADLs?: No Does the patient have difficulty walking or climbing stairs?: Yes Weakness of Legs: Both Weakness of Arms/Hands: Both  Permission Sought/Granted                  Emotional Assessment Appearance:: Appears older than stated age Attitude/Demeanor/Rapport: Lethargic Affect (typically observed): Flat, Quiet, Withdrawn   Alcohol / Substance Use: Alcohol Use Psych Involvement: No  (comment)  Admission diagnosis:  Delirium tremens (HCC) [F10.231] Hyponatremia [E87.1] Alcohol withdrawal seizure with delirium (HCC) [F10.231, R56.9] Hypotension, unspecified hypotension type [I95.9] Patient Active Problem List   Diagnosis Date Noted   Protein-calorie malnutrition, severe 05/22/2020   Hypotension    Ascites    Acute alcoholic pancreatitis 05/10/2020   Acute metabolic encephalopathy 05/10/2020   Severe malnutrition (HCC) 05/10/2020   Sinus tachycardia 05/10/2020   Alcohol withdrawal seizure with delirium (HCC) 05/03/2020   Hyponatremia 05/03/2020   Hypokalemia 05/03/2020   AKI (acute kidney injury) (HCC) 05/03/2020   Elevated CK 05/03/2020   Heme positive stool 05/03/2020   Thrombocytopenia (HCC) 05/03/2020   PCP:  [name], MD Pharmacy:  No Pharmacies Listed    Social Determinants of Health (SDOH) Interventions    Readmission Risk Interventions No flowsheet data found.

## 2020-05-25 NOTE — Progress Notes (Signed)
48 hr Calorie Count   Estimated Nutritional Needs:   Kcal:  1900-2200 Protein:  95-105 grams Fluid:  1.9-2.2 L/day  Dinner 7/14- 1 Ensure Enlive   Total- 350kcal and 20g protein  Breakfast 7/15- 1 carton milk, 1 Ensure Enlive    Total- 470kcal and 28g protein  Lunch 7/15- 50% vegetable soup, 50% pot roast, 50% mashed potatoes, 1 Ensure Enlive   Total- 540kcal and 30g protein   Dinner 7/15- 50% milk, 1 shasta cola  Total- 150kcal and 4g protein   Breakfast 7/16- Ensure Enlive, 100% grits  Total- 470kcal and 22g protein   Total Intake Day 1: 1360 kcal (71% estimated needs) and  78g protein (82% estimated needs)  Total Intake Day 2: 620kcal (33% estimated needs) and 26g protein (27% estimated needs)  Betsey Holiday MS, RD, LDN Please refer to [facility] for RD and/or RD on-call/weekend/after hours pager

## 2020-05-25 NOTE — Consult Note (Signed)
PHARMACY CONSULT NOTE - FOLLOW UP  Pharmacy Consult for Electrolyte Monitoring and Replacement   Recent Labs: Potassium (mmol/L)  Date Value  05/25/2020 3.9   Magnesium (mg/dL)  Date Value  91/47/8295 1.6 (L)   Calcium (mg/dL)  Date Value  62/13/0865 8.3 (L)   Albumin (g/dL)  Date Value  78/46/9629 1.7 (L)   Phosphorus (mg/dL)  Date Value  52/84/1324 5.0 (H)   Sodium (mmol/L)  Date Value  05/25/2020 134 (L)     Assessment: Patient is a 34 y/o M with history of EtOH abuse and depression who is admitted with encephalopathy, cerebral venous sinus thrombosis, pancreatitis, severe malnutrition, nutritional deficiencies. Patient was on TPN 7/7 thru 7/15. Pharmacy has been consulted to assist with electrolyte replacement.  Goal of Therapy:  Electrolytes within normal limits  Plan:  --Magnesium 1.6, replaced with magnesium sulfate 4 g IV x 1 --No further electrolyte replacement indicated at this time --Follow-up electrolytes tomorrow AM  Tressie Ellis 05/25/2020 5:16 PM

## 2020-05-25 NOTE — Progress Notes (Signed)
PROGRESS NOTE    Shane Kassabian Sr.  DSK:[id number] DOB: [birth year] DOA: 05/03/2020 PCP: Lorelee Market, MD   Brief Narrative:  34 year old gentleman with no medical history, chronic alcoholism, has been drinking nonstop about 12 packs of beer daily since age of 34 presented to the ED on  05/03/2020 for evaluation of progressive confusion and lethargy. He also had episode of unresponsiveness in the triage area. As per patient's fianc, patient stopped drinking about 24 hours prior to arrival and started behaving altered, confused. Also reported multiple episodes diarrhea, but no nausea or vomiting.  In ED he had 1 witnessed seizure. CT abdomen with concern of pancreatitis with elevated lipase, bilateral pleural effusions and ascites.  He was initially admitted for management of acute alcohol withdrawal and alcoholic pancreatitis.  Patient has a very complicated course of illness, underwent alcohol withdrawal.  There was some concern of SBP.  He was evaluated by surgeons on 05/06/2020 for concern of acute compartment syndrome and abdomen and found to have some intra-abdominal hypertension which was managed conservatively by placing Foley catheter and NG tube.  Sentences with cell count of 822, neutrophils of 79%.  Lipase was elevated at 53,000, LDH 526 and amylase of 10,000.  Cultures were negative.  Consistent with pancreatic ascites.   Lipase continued to get worse initially with peaked at 1543, now trending down.  MRCP and repeat CT abdomens with some attenuations in pancreatic head and body, some improvement in edema.      Neurology was consulted on 05/10/2020 due to worsening altered mental status. 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  Patient did not receive any coronavirus [employer] vaccine.  He was initiated on heparin.  EEG showed generalized background slowing.  No epileptiform activity was noted. A  repeat MRI brain without contrast done on 05/15/2020 showed Residual abnormal diffusion restriction over the right posterior parietal lobe and right cerebellum, consistent with expected evolution of subacute venous infarcts.  It was started on Heparin by neurology.  On 05/16/2019 1 in the evening he became hypotensive and hypothermic with worsening leukocytosis and development of lactic acidosis.  He was transferred to ICU on 05/16/2020 due to the concern for developing septic shock.  He was started on pressors.  His lactate was 2 1 pro-Cal was 1.84 WBC was 40.4 and hemoglobin was 11.9.  CT abdomen done on 05/16/2020 showed peripancreatic edema with multiple pseudocyst.  The dominant pseudocyst has decreased to 2.2 cm.  There was extensive ascites with loculation around the liver right gutter and pelvis. Patient has been started on meropenem.  Although urine, blood and ascitic fluid cultures remain negative.  C. difficile colitis and GI pathogens were negative.  Plan is to continue meropenem for 7 days for concern of infected pseudocyst.  Patient did developed decreased in hemoglobin and there was some concern of GI bleed.  Patient and his wife did endorse some blood in his stool before coming to the hospital.  Patient has a strong family history of colon cancer where both of his parents died of colon cancer, mother in 36s and father in 97s.  GI is recommending outpatient work-up after this acute illness.   TPN was started on 05/17/2020 due to poor p.o. intake and stopped on 05/24/2020.  05/24/20.  Patient become altered with confabulation-suspecting Wernicke's encephalopathy and he was started on high-dose thiamine.  MRI without any acute change.  Subjective: Patient appears back to his  baseline.  More pleasant and reacting appropriately.  Wife and mother-in-law was in the room.  He was able to take his breakfast and drinking ensures.  Had another discussion regarding alcohol and tobacco use.  Patient again assured  me that he is not going to use any of those anymore.  He was willing to increase his p.o. intake with the help of supplements.  Assessment & Plan:   Principal Problem:   Acute alcoholic pancreatitis Active Problems:   Alcohol withdrawal seizure with delirium (HCC)   Hyponatremia   Hypokalemia   AKI (acute kidney injury) ([place])   Elevated CK   Heme positive stool   Thrombocytopenia (HCC)   Acute metabolic encephalopathy   Severe malnutrition (HCC)   Sinus tachycardia   Ascites   Hypotension   Protein-calorie malnutrition, severe  Acute encephalopathy.  Appears improved.   Patient developed acute encephalopathy by pulling all his lines and trying to get out of bed.  he was confabulating.  Concern of Wernicke's  encephalopathy.  Patient was getting some thiamine with TPN. MRI without any new change. -He was started on high-dose thiamine 500 mg 3 times a day for 2 days followed by 250 mg daily for another 3 days.  Day 2 today.  Sepsis with septic shock.  Resolved  Transferred to ICU on 05/15/2020 due to hypothermia and hypotension requiring Levophed.  Now off the Levophed and maintaining his blood pressure.  ID was also consulted and they started him on meropenem for concern of infected pseudocyst. Patient afebrile with improvement in leukocytosis. Completed the course of meropenem.  Blood transfusion reaction.  Patient did develop some rash and become short of breath with some pulmonary vascular congestion yesterday while getting blood transfusion. -Tolerated another transfusion after that without any problem.. Total of 3.5 units of PRBC given during current hospitalization.  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   CT venogram on 7/3 and a repeat on 7/5 with persistent thrombosis at the confluence of sinuses and left transverse dural venous sinus.  Neurology is following and they start him on heparin infusion.   Patient also had acute right cerebellum and a possible old infarcts involving parietal lobes and left occipital region, seen on MRI done on 05/12/2020.  No hypercoagulable work-up. Due to his worsening lower extremity rash which is also involving some upper extremities there is some concern of vasculitis.  Patient also has some rash/thrush in his mouth. His differential is broad at this time which includes rheumatologic disorder/vasculitis/Behcet's syndrome/malignancy. Elevated ESR and CRP.  CK has been normalized. Dr. Jefm Bryant from rheumatology saw the patient and agrees about his confusing picture as he is not fitting on any definite thing.  His rash and CVT can be explained with Behcet's syndrome but that is a clinical diagnosis.  No oral or genital ulceration.  No vision or eye changes.  Vasculitis or panniculitis can be another possibility.  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 -Patient was started on Solu-Medrol 80 mg daily after the skin biopsy, he received 3 days of Solu-Medrol-we will discontinue steroid as there is no vasculitis on biopsy. -RPR and hepatitis panel negative. -ANCA titers negative. -Cardiolipin antibodies negative. -Antithrombin III activity reduced at 59 with reduced AC [place] at 30.  Which is more consistent with increased consumption as patient has active thrombosis.  He will need a repeat testing after acute illness and needs a follow-up with hematologist as an outpatient. -Protein  S levels within normal limit. -CA 19-9 mildly elevated at 81-which is very nonspecific. -Lupus anticoagulant panel with PTT lupus anticoagulant within normal limit and DRV VT mildly elevated at 51.2, which can occur by a deficiency of one of the common path risk factor include 2, 5, 10 or fibrinogen. -Prothrombin gene mutation negative -Vitamin D level reduced at 7.39-we started him on repletion. -Folic acid was reduced-started him on  supplement. -B1 within normal limit. -Rest of the labs which include protein C, factor V [place],  levels are pending. -Switch Lovenox with Xarelto-there are some studies which shows favorable outcome in patients with CVT with Xarelto.  More studies with Coumadin but patient might not stay compliant.  He will need 6 months of anticoagulation. -Still need outpatient oncology work-up as there is no other obvious cause found for CVT.  Erythematous skin rash/pancreatic panic colitis.  Seems improving There is some concern of vasculitis.  Less likely hood of cellulitis as it is present on all extremities.  -Skin biopsy was done by dermatology on 05/23/20-consistent with pancreatic panniculitis-enzymatic fat necrosis secondary to severe pancreatitis.  Lower extremity edema.  Multifactorial as he received a lot of fluid along with lower albumin.  He did receive some doses of IV Lasix which resulted in improvement of edema. -I will give him some albumin. -Discontinue Lasix.  Thrombocytopenia.  Resolved with platelet of 178 today.  HIT antibodies are negative. Switch heparin with therapeutic Lovenox.  Patient will need long-term anticoagulation once able to take p.o. intake.    Anemia.  Hemoglobin improving, 8.5 today.  Received total of 3.5 units of PRBC.   Per wife he did had some bleeding per rectum at home.  Not aware of any obvious bleeding while in the hospital.  Patient is critically ill also.  Had some mild hematuria on UA done on 05/16/2020.  No gross hematuria.  CT abdomen without any retroperitoneal bleed and repeat paracentesis on 05/23/2020 was without any blood. -Check anemia panel-iron studies consistent with anemia of chronic disease.  Low folic acid, normal P53. -Start him on folic acid supplement. -Continue to monitor and transfuse below 7.  Chronic pancreatitis with pseudocyst formation.  Present on admission.  CT abdomen without any gallbladder abnormalities but did had multiple cyst in  pancreatic head and body.  Lipase peaked at 1543, now trending down. -CT abdomen was repeated 7/13 due to concern of retroperitoneal bleeding.  It shows stable pancreatitis changes with pseudocyst.  Stable ascites and pleural effusion. -Continue supportive care. -Recheck lipase with morning labs.  Severe protein calorie malnutrition.  POA, most likely secondary to chronic alcohol abuse.  Patient does not want enteric feeding with NG tube.  BMI of 23.58. TPN from 05/17/2020 till 05/24/2020. -Dietitian has put their recommendations with improving p.o. intake. -Continue monitoring daily CMP, magnesium, phosphorous. -Encourage p.o. intake.  Nutritional deficiencies.  Secondary to alcohol abuse. Patient so far found to have deficiencies of vitamin A, vitamin C, vitamin D and copper.  Zinc is within lower normal limit.  Thiamine within normal limit. -Ask pharmacy for proper supplement.  Transaminitis.  Started improving. He was on TPN and also has an history of alcohol abuse. -Continue to monitor.  TSH elevation.  T4 normal.  No acute concern at this time.  Diarrhea.  Resolved.  Sinus tachycardia.  Improving No obvious cause.  Tachycardia persist off the pressors.  Denies any pain.  Past alcohol withdrawal.  Cultures negative. Echo normal.   -Continue metoprolol 25 mg twice daily.  Pancreatic ascites.  Paracentesis labs are consistent with pancreatic ascites.  CT abdomen with mild fatty infiltrate, no cirrhosis.  Patient underwent paracentesis twice 1 on 05/07/2020 and second on 05/16/2020.  Culture remain negative.  Repeat CT abdomen on 7/13 with stable ascites. -Repeat paracentesis -on 7/14, again consistent with pancreatic ascites with negative cultures.  Pressure injuries.  High risk due to severe protein calorie malnutrition and inability to move.  Getting some unstageable breakdown on his sacrum and bilateral heels. -Continue to monitor. -Frequent position changes.  AKI.   Resolved Patient had AKI on admission.  Most likely prerenal as creatinine normalized with IV hydration. -Continue to monitor.  Electrolyte abnormalities.  Hypomagnesemia today which is being repleted..  Patient had hypervolemic hyponatremia which was present on admission and has been resolved.  He also developed some hypokalemia which has been resolved at this time.   -Continue to monitor BMP, magnesium and phosphate.  Depression.  Patient appears depressed.  Per wife he is feeling very down since the death of his father and drinking a lot in order to overcome his depression. -Psychiatry consult-was consulted many days ago, no notes yet. -Another message to Dr. Janese Banks  Palliative care consult.  Palliative care was consulted to discuss goals of care.  Patient and his wife will continue aggressive measures and to treat treatables.  Code was changed to DNR. Palliative signed off today-as we decided to pursue aggressive measures. Patient is critically ill with multiorgan involvement and is very high risk for deterioration and death.   Objective: Vitals:   06-13-2020 0600 2020-06-13 0700 13-Jun-2020 0800 2020/06/13 0937  BP: (!) 134/91 118/73 119/79 130/89  Pulse: 94 94  (!) 113  Resp: (!) 27 (!) 25 (!) 21   Temp:   98.2 F (36.8 C)   TempSrc:      SpO2: 95% 97%    Weight:      Height:        Intake/Output Summary (Last 24 hours) at 06-13-2020 1408 Last data filed at June 13, 2020 0539 Gross per 24 hour  Intake 50 ml  Output 2175 ml  Net -2125 ml   Filed Weights   05/22/20 0159 05/24/20 0132 2020-06-13 0200  Weight: 68.2 kg 69.7 kg 67.9 kg    Examination:  General exam: Chronically ill-appearing gentleman, appears calm and comfortable. Respiratory system: Clear bilaterally, respiratory effort normal. Cardiovascular system: S1 & S2 heard, RRR. Gastrointestinal system: Soft, nontender, mildly distended, bowel sounds positive. Central nervous system: Alert and oriented x2 today. No focal  neurological deficits. Extremities: 1+ LE edema, no cyanosis, pulses intact and symmetrical. Skin: Erythematous rash involving all extremities seems improving. Psychiatry: Judgement and insight appear normal.  DVT prophylaxis: Heparin Code Status: DNR Family Communication: Discussed with wife at bedside. Disposition Plan:  Status is: Inpatient  Remains inpatient appropriate because:Inpatient level of care appropriate due to severity of illness   Dispo: The patient is from: Home              Anticipated d/c is to: To be determined              Anticipated d/c date is: > 3 days              Patient currently is not medically stable to d/c.  Consultants:   Neurology  Gastroenterology  General surgery  Palliative care  PCCM  Rheumatology  Dermatology  Procedures:  Antimicrobials:  Meropenem  Data Reviewed: I have personally reviewed following labs and imaging studies  CBC:  Recent Labs  Lab 05/21/20 0434 05/21/20 0434 05/22/20 0155 05/23/20 0458 05/23/20 1229 05/24/20 0409 05/25/20 0312  WBC 15.7*  --  19.0* 15.6*  --  16.9* 10.3  NEUTROABS 11.9*  --   --   --   --   --   --   HGB 7.6*  --  6.8* 6.9*  --  7.9* 8.5*  HCT 23.0*   < > 20.7* 20.7* 20.8* 23.9* 25.7*  MCV 92.4  --  94.1 92.8  --  98.0 92.4  PLT 79*  --  85* 88*  --  130* 178   < > = values in this interval not displayed.   Basic Metabolic Panel: Recent Labs  Lab 05/21/20 0434 05/21/20 0434 05/22/20 0155 05/23/20 0458 05/24/20 0409 05/24/20 0550 05/25/20 0312  NA 137   < > 132* 134* 128* 132* 134*  K 5.0   < > 4.7 4.5 5.9* 4.3 3.9  CL 103   < > 98 95* 93* 94* 97*  CO2 28   < > '27 27 27 30 28  ' GLUCOSE 133*   < > 131* 131* 939* 174* 86  BUN 18   < > '18 19 18 18 19  ' CREATININE 0.57*   < > 0.54* 0.52* 0.51* 0.50* 0.60*  CALCIUM 8.3*   < > 8.1* 8.3* 8.3* 8.2* 8.3*  MG 1.7  --  1.8 1.7 2.7*  --  1.6*  PHOS 4.7*  --  4.9* 4.6 4.5  --  5.0*   < > = values in this interval not displayed.    GFR: Estimated Creatinine Clearance: 121.6 mL/min (A) (by C-G formula based on SCr of 0.6 mg/dL (L)). Liver Function Tests: Recent Labs  Lab 05/21/20 0434 05/22/20 0155 05/23/20 0458 05/24/20 0409 05/25/20 0312  AST 115* 138* 79* 80* 89*  ALT 61* 105* 87* 83* 112*  ALKPHOS 188* 169* 150* 137* 138*  BILITOT 0.5 0.6 0.5 0.5 0.6  PROT 4.6* 4.7* 4.9* 4.7* 5.1*  ALBUMIN 1.4* 1.4* 1.4* 1.5* 1.7*   Recent Labs  Lab 05/18/20 1830 05/21/20 1454  LIPASE 171* 241*   Recent Labs  Lab 05/23/20 1532  AMMONIA 12   Coagulation Profile: No results for input(s): INR, PROTIME in the last 168 hours. Cardiac Enzymes: Recent Labs  Lab 05/21/20 1454 05/22/20 0155  CKTOTAL 30* 23*   BNP (last 3 results) No results for input(s): PROBNP in the last 8760 hours. HbA1C: No results for input(s): HGBA1C in the last 72 hours. CBG: Recent Labs  Lab 05/24/20 0001 05/24/20 0535 05/24/20 1118 05/25/20 0021 05/25/20 0532  GLUCAP 244* 186* 127* 122* 84   Lipid Profile: No results for input(s): CHOL, HDL, LDLCALC, TRIG, CHOLHDL, LDLDIRECT in the last 72 hours. Thyroid Function Tests: No results for input(s): TSH, T4TOTAL, FREET4, T3FREE, THYROIDAB in the last 72 hours. Anemia Panel: No results for input(s): VITAMINB12, FOLATE, FERRITIN, TIBC, IRON, RETICCTPCT in the last 72 hours. Sepsis Labs: Recent Labs  Lab 05/21/20 0434 05/22/20 0155 05/23/20 0458 05/23/20 1515  PROCALCITON 1.03 1.24 1.34  --   LATICACIDVEN  --   --   --  1.0    Recent Results (from the past 240 hour(s))  CULTURE, BLOOD (ROUTINE X 2) w Reflex to ID Panel     Status: None   Collection Time: 05/15/20 11:52 PM   Specimen: Left Antecubital; Blood  Result Value Ref Range Status   Specimen Description LEFT ANTECUBITAL  Final   Special Requests   Final  BOTTLES DRAWN AEROBIC AND ANAEROBIC Blood Culture adequate volume   Culture   Final    NO GROWTH 5 DAYS Performed at [facility], [address] [place], [place] [address]    Report Status 05/21/2020 FINAL  Final  CULTURE, BLOOD (ROUTINE X 2) w Reflex to ID Panel     Status: None   Collection Time: 05/15/20 11:53 PM   Specimen: BLOOD LEFT HAND  Result Value Ref Range Status   Specimen Description BLOOD LEFT HAND  Final   Special Requests   Final    BOTTLES DRAWN AEROBIC AND ANAEROBIC Blood Culture adequate volume   Culture   Final    NO GROWTH 5 DAYS Performed at [facility], [address] [place], [place] [address]    Report Status 05/21/2020 FINAL  Final  MRSA PCR Screening     Status: None   Collection Time: 05/16/20 12:24 AM   Specimen: Nasopharyngeal  Result Value Ref Range Status   MRSA by PCR NEGATIVE NEGATIVE Final    Comment:        The GeneXpert MRSA Assay (FDA approved for NASAL specimens only), is one component of a comprehensive MRSA colonization surveillance program. It is not intended to diagnose MRSA infection nor to guide or monitor treatment for MRSA infections. Performed at [facility], [address] [place], [place] [address]   Urine Culture     Status: None   Collection Time: 05/16/20  3:27 AM   Specimen: Urine, Random  Result Value Ref Range Status   Specimen Description   Final    URINE, RANDOM Performed at [facility], [address] [place], [place] [address]    Special Requests   Final    NONE Performed at [facility], [address] [place], [place] [address]    Culture   Final    NO GROWTH Performed at [facility], [place] [address], [place], [place] [address]    Report Status 05/17/2020 FINAL  Final  Gastrointestinal Panel by PCR , Stool     Status: None   Collection Time: 05/17/20  3:06 PM   Specimen: Stool  Result Value Ref Range Status   Campylobacter species NOT DETECTED NOT DETECTED Final   Plesimonas shigelloides NOT DETECTED NOT DETECTED Final   Salmonella species NOT DETECTED NOT DETECTED Final   Yersinia  enterocolitica NOT DETECTED NOT DETECTED Final   Vibrio species NOT DETECTED NOT DETECTED Final   Vibrio cholerae NOT DETECTED NOT DETECTED Final   Enteroaggregative E coli (EAEC) NOT DETECTED NOT DETECTED Final   Enteropathogenic E coli (EPEC) NOT DETECTED NOT DETECTED Final   Enterotoxigenic E coli (ETEC) NOT DETECTED NOT DETECTED Final   Shiga like toxin producing E coli (STEC) NOT DETECTED NOT DETECTED Final   Shigella/Enteroinvasive E coli (EIEC) NOT DETECTED NOT DETECTED Final   Cryptosporidium NOT DETECTED NOT DETECTED Final   Cyclospora cayetanensis NOT DETECTED NOT DETECTED Final   Entamoeba histolytica NOT DETECTED NOT DETECTED Final   Giardia lamblia NOT DETECTED NOT DETECTED Final   Adenovirus F40/41 NOT DETECTED NOT DETECTED Final   Astrovirus NOT DETECTED NOT DETECTED Final   Norovirus GI/GII NOT DETECTED NOT DETECTED Final   Rotavirus A NOT DETECTED NOT DETECTED Final   Sapovirus (I, II, IV, and V) NOT DETECTED NOT DETECTED Final    Comment: Performed at [facility], [address], [place], [place] [address]  C Difficile Quick Screen (NO PCR Reflex)  Status: None   Collection Time: 05/17/20  3:06 PM   Specimen: STOOL  Result Value Ref Range Status   C Diff antigen NEGATIVE NEGATIVE Final   C Diff toxin NEGATIVE NEGATIVE Final   C Diff interpretation No C. difficile detected.  Final    Comment: Performed at [facility], [address] [place], [place] [address]  Aerobic Culture (superficial specimen)     Status: None   Collection Time: 05/21/20  2:30 PM   Specimen: Wound  Result Value Ref Range Status   Specimen Description   Final    WOUND Performed at [facility], [address] [place], [place] [address]    Special Requests   Final    NONE Performed at [facility], [place], [place] [address]    Gram Stain NO WBC SEEN NO ORGANISMS SEEN   Final   Culture   Final    NO GROWTH 2 DAYS Performed  at [facility], [place] [address] [place], [place] [address]    Report Status 05/25/2020 FINAL  Final  Peritoneal Fluid culture ( includes gram stain)     Status: None (Preliminary result)   Collection Time: 05/23/20 11:41 AM   Specimen: Peritoneal Washings; Peritoneal Fluid  Result Value Ref Range Status   Specimen Description   Final    PERITONEAL Performed at [facility], [address] [place], [place] [address]    Special Requests   Final    Normal Performed at [facility], [address] [place], [place] [address]    Gram Stain   Final    WBC PRESENT,BOTH PMN AND MONONUCLEAR NO ORGANISMS SEEN CYTOSPIN SMEAR    Culture   Final    NO GROWTH 2 DAYS Performed at [facility], [place] [address] [place], [place] [address]    Report Status PENDING  Incomplete  Body fluid culture     Status: None (Preliminary result)   Collection Time: 05/23/20  1:29 PM   Specimen: PATH Cytology Peritoneal fluid  Result Value Ref Range Status   Specimen Description   Final    PERITONEAL Performed at [facility], [address], [place], [place] [address]    Special Requests   Final    NONE Performed at [facility], [address] [place], [place] [address]    Gram Stain   Final    WBC PRESENT,BOTH PMN AND MONONUCLEAR NO ORGANISMS SEEN CYTOSPIN SMEAR    Culture   Final    NO GROWTH 2 DAYS Performed at [facility], [place] [address] [place], [place] [address]    Report Status PENDING  Incomplete  Aerobic Culture (superficial specimen)     Status: None (Preliminary result)   Collection Time: 05/23/20  1:29 PM   Specimen: PATH Cytology Peritoneal fluid  Result Value Ref Range Status   Specimen Description   Final    PERITONEAL Performed at [facility], [address] [place], [place] [address]    Special Requests   Final    NONE Performed at [facility], [address] [place], [place] [address]     Gram Stain   Final    RARE WBC PRESENT, PREDOMINANTLY MONONUCLEAR NO ORGANISMS SEEN    Culture   Final    NO GROWTH 2 DAYS Performed at [facility], [place] [address], [place], [place] [address]    Report Status PENDING  Incomplete  Radiology Studies: CT HEAD WO CONTRAST  Result Date: 05/23/2020 CLINICAL DATA:  Altered mental status, unclear cause. EXAM: CT HEAD WITHOUT CONTRAST TECHNIQUE: Contiguous axial images were obtained from the base of the skull through the vertex without intravenous contrast. COMPARISON:  CT venogram head 05/20/2020, brain MRI 05/15/2020, head CT 05/14/2020. FINDINGS: Brain: Cerebral volume is normal for age. Redemonstrated subtle cortical hyperdensity within the right parietal lobe and small focus of hypodensity within the right cerebellum at sites of known subacute venous infarcts. No interval infarct is identified. No evidence of acute intracranial hemorrhage. No extra-axial fluid collection. No evidence of intracranial mass. No midline shift. Vascular: No hyperdense vessel. Venous thrombus within the torcula and left transverse sinus was better appreciated on prior CT venogram head 05/20/2020. Skull: Normal. Negative for fracture or focal lesion. Sinuses/Orbits: Visualized orbits show no acute finding. No significant paranasal sinus disease or mastoid effusion at the imaged levels. 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 No CT evidence of interval intracranial abnormality. Electronically Signed   By: Kellie Simmering DO   On: 05/23/2020 15:36   MR BRAIN W WO CONTRAST  Result Date: 05/24/2020 CLINICAL DATA:  Encephalopathy suspect Wernicke's . History of cerebral infarct and dural venous thrombosis. EXAM: MRI HEAD WITHOUT AND WITH CONTRAST TECHNIQUE: Multiplanar, multiecho pulse sequences of the brain and surrounding structures were obtained without and with intravenous contrast. CONTRAST:  12m  GADAVIST GADOBUTROL 1 MMOL/ML IV SOLN COMPARISON:  MRI head 05/15/2020, 05/12/2020 FINDINGS: Brain: Motion degraded study with multiple repeat attempts. Negative for acute infarct. Recent infarcts in the right cerebellum and right parietal lobe and left parietal lobe have resolved. No parenchymal hemorrhage. Clot is present in the region of the torcular. No mass lesion or hydrocephalus. 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 Postcontrast images degraded by extensive motion. Allowing for this, no abnormal enhancement identified. There is filling defect in the left transverse sinus and torcula compatible with residual thrombus. Vascular: Filling defect in the torcula and left transverse sinus compatible with thrombus as noted previously. Normal arterial flow voids. Skull and upper cervical spine: No focal skeletal lesion. Sinuses/Orbits: Paranasal sinuses clear.  Negative orbit Other: Motion degraded study IMPRESSION: Negative for acute infarct. Resolving infarcts in the right cerebellum and parietal lobes bilaterally Persistent clot in the torcula and left transverse sinus similar to that seen on prior studies. Image quality degraded by motion. Electronically Signed   By: CFranchot GalloM.D.   On: 05/24/2020 18:58    Scheduled Meds: . sodium chloride   Intravenous Once  . vitamin C  500 mg Oral BID  . Chlorhexidine Gluconate Cloth  6 each Topical Daily  . dronabinol  2.5 mg Oral BID AC  . feeding supplement (ENSURE ENLIVE)  237 mL Oral TID BM  . ferrous [id number]-folic acid  1 capsule Oral BID PC  . folic acid  1 mg Intravenous Daily  . Gerhardt's butt cream   Topical TID  . insulin aspart  0-9 Units Subcutaneous Q6H  . metoprolol tartrate  25 mg Oral BID  . multivitamin with minerals  1 tablet Oral Daily  . multivitamin-lutein  1 capsule Oral Daily  . pantoprazole (PROTONIX) IV  40 mg Intravenous Q12H  . sodium chloride flush  3 mL Intravenous Q12H  . vitamin A  10,000 Units Oral Daily  .  Vitamin D (Ergocalciferol)  50,000 Units Oral Q7 days  . zinc sulfate  220 mg Oral Daily   Continuous Infusions: .  sodium chloride Stopped (05/13/20 1628)  . albumin human 12.5 g (05/25/20 1156)  . thiamine 510m in normal saline (59m IVPB 500 mg (05/25/20 099409  Followed by  . [START ON 05/27/2020] sodium chloride 0.9 % 50 mL with thiamine (B-1) 250 mg infusion       LOS: 22 days   Time spent: 50 minutes.  More than 50% of that time was spent in direct patient care and discussing the plan with nursing staff and wife.  SuLorella NimrodMD [facility]  If 7PM-7AM, please contact night-coverage [email or web address]  05/25/2020, 2:08 PM   This record has been created using Dragon voice recognition software. Errors have been sought and corrected,but may not always be located. Such creation errors do not reflect on the standard of care.

## 2020-05-25 NOTE — Progress Notes (Signed)
GI note  -No overt bleeding noted -No blood seen in ascites -No collection of blood seen in the abdomen on imaging.  I will sign off.  Please call me if any further GI concerns or questions.  We would like to thank you for the opportunity to participate in the care of Shane Manard Sr..   Dr Wyline Mood MD,MRCP [facility]) Gastroenterology/Hepatology Pager: [phone]

## 2020-05-26 LAB — COMPREHENSIVE METABOLIC PANEL
ALT: 93 U/L — ABNORMAL HIGH (ref 0–44)
AST: 54 U/L — ABNORMAL HIGH (ref 15–41)
Albumin: 2.1 g/dL — ABNORMAL LOW (ref 3.5–5.0)
Alkaline Phosphatase: 123 U/L (ref 38–126)
Anion gap: 12 (ref 5–15)
BUN: 16 mg/dL (ref 6–20)
CO2: 28 mmol/L (ref 22–32)
Calcium: 8.8 mg/dL — ABNORMAL LOW (ref 8.9–10.3)
Chloride: 95 mmol/L — ABNORMAL LOW (ref 98–111)
Creatinine, Ser: 0.63 mg/dL (ref 0.61–1.24)
GFR calc Af Amer: >60 mL/min (ref >60)
GFR calc non Af Amer: >60 mL/min (ref >60)
Glucose, Bld: 93 mg/dL (ref 70–99)
Potassium: 3.9 mmol/L (ref 3.5–5.1)
Sodium: 135 mmol/L (ref 135–145)
Total Bilirubin: 0.4 mg/dL (ref 0.3–1.2)
Total Protein: 5.4 g/dL — ABNORMAL LOW (ref 6.5–8.1)

## 2020-05-26 LAB — CBC
HCT: 28.8 % — ABNORMAL LOW (ref 39.0–52.0)
Hemoglobin: 9.4 g/dL — ABNORMAL LOW (ref 13.0–17.0)
MCH: 30.9 pg (ref 26.0–34.0)
MCHC: 32.6 g/dL (ref 30.0–36.0)
MCV: 94.7 fL (ref 80.0–100.0)
Platelets: 226 10*3/uL (ref 150–400)
RBC: 3.04 MIL/uL — ABNORMAL LOW (ref 4.22–5.81)
RDW: 14.6 % (ref 11.5–15.5)
WBC: 8.5 10*3/uL (ref 4.0–10.5)
nRBC: 0 % (ref 0.0–0.2)

## 2020-05-26 LAB — GLUCOSE, CAPILLARY
Glucose-Capillary: 95 mg/dL (ref 70–99)
Glucose-Capillary: 73 mg/dL (ref 70–99)
Glucose-Capillary: 89 mg/dL (ref 70–99)

## 2020-05-26 LAB — AEROBIC CULTURE W GRAM STAIN (SUPERFICIAL SPECIMEN): Culture: NO GROWTH

## 2020-05-26 LAB — LIPASE, FLUID
Lipase-Fluid: 17430 U/L
Lipase-Fluid: 19299 U/L

## 2020-05-26 LAB — MAGNESIUM: Magnesium: 1.9 mg/dL (ref 1.7–2.4)

## 2020-05-26 LAB — LIPASE, BLOOD: Lipase: 84 U/L — ABNORMAL HIGH (ref 11–51)

## 2020-05-26 LAB — TOTAL BILIRUBIN, BODY FLUID
Total bilirubin, fluid: 0.5 mg/dL
Total bilirubin, fluid: 0.5 mg/dL

## 2020-05-26 NOTE — Consult Note (Signed)
PHARMACY CONSULT NOTE - FOLLOW UP  Pharmacy Consult for Electrolyte Monitoring and Replacement   Recent Labs: Potassium (mmol/L)  Date Value  05/26/2020 3.9   Magnesium (mg/dL)  Date Value  25/36/6440 1.9   Calcium (mg/dL)  Date Value  34/74/2595 8.8 (L)   Albumin (g/dL)  Date Value  63/87/5643 2.1 (L)   Phosphorus (mg/dL)  Date Value  32/95/1884 5.0 (H)   Sodium (mmol/L)  Date Value  05/26/2020 135     Assessment: Patient is a 34 y/o M with history of EtOH abuse and depression who is admitted with encephalopathy, cerebral venous sinus thrombosis, pancreatitis, severe malnutrition, nutritional deficiencies. Patient was on TPN 7/7 thru 7/15. Pharmacy has been consulted to assist with electrolyte replacement.  Goal of Therapy:  Electrolytes within normal limits  Plan:  No electrolyte replacement indicated today. Continue to follow along.  Pricilla Riffle, PharmD 05/26/2020 9:01 AM

## 2020-05-26 NOTE — Progress Notes (Signed)
PROGRESS NOTE    Shane Talarico Sr.  OIZ:[id number] DOB: Nov 06, 1986 DOA: 05/03/2020 PCP: Lorelee Market, MD   Brief Narrative:  34 year old gentleman with no medical history, chronic alcoholism, has been drinking nonstop about 12 packs of beer daily since age of 34 presented to the ED on  05/03/2020 for evaluation of progressive confusion and lethargy. He also had episode of unresponsiveness in the triage area. As per patient's fianc, patient stopped drinking about 24 hours prior to arrival and started behaving altered, confused. Also reported multiple episodes diarrhea, but no nausea or vomiting.  In ED he had 1 witnessed seizure. CT abdomen with concern of pancreatitis with elevated lipase, bilateral pleural effusions and ascites.  He was initially admitted for management of acute alcohol withdrawal and alcoholic pancreatitis.  Patient has a very complicated course of illness, underwent alcohol withdrawal.  There was some concern of SBP.  He was evaluated by surgeons on 05/06/2020 for concern of acute compartment syndrome and abdomen and found to have some intra-abdominal hypertension which was managed conservatively by placing Foley catheter and NG tube.  Paracentesis with cell count of 822, neutrophils of 79%.  Lipase was elevated at 53,000, LDH 526 and amylase of 10,000.  Cultures were negative.  Consistent with pancreatic ascites.  Repeat paracentesis was also consistent with pancreatic ascites and cultures remain negative.   Lipase continued to get worse initially with peaked at 1543, now trending down.  MRCP and repeat CT abdomens with some attenuations in pancreatic head and body, some improvement in edema.      Neurology was consulted on 05/10/2020 due to worsening altered mental status. 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  Patient did not receive any coronavirus [employer] vaccine.  He was initiated on  heparin.  EEG showed generalized background slowing.  No epileptiform activity was noted. A repeat MRI brain without contrast done on 05/15/2020 showed Residual abnormal diffusion restriction over the right posterior parietal lobe and right cerebellum, consistent with expected evolution of subacute venous infarcts.  It was started on Heparin by neurology, later transitioned to Xarelto.  Will need a 34-monthof anticoagulation.  On 05/16/2019 1 in the evening he became hypotensive and hypothermic with worsening leukocytosis and development of lactic acidosis.  He was transferred to ICU on 05/16/2020 due to the concern for developing septic shock.  He was started on pressors.  His lactate was 2 1 pro-Cal was 1.84 WBC was 40.4 and hemoglobin was 11.9.  CT abdomen done on 05/16/2020 showed peripancreatic edema with multiple pseudocyst.  The dominant pseudocyst has decreased to 2.2 cm.  There was extensive ascites with loculation around the liver right gutter and pelvis. Patient has been started on meropenem.  Although urine, blood and ascitic fluid cultures remain negative.  C. difficile colitis and GI pathogens were negative.  He completed a 9-day course of meropenem due to concern of infected pseudocyst.  Patient did developed decreased in hemoglobin and there was some concern of GI bleed.  Patient and his wife did endorse some blood in his stool before coming to the hospital.  Patient has a strong family history of colon cancer where both of his parents died of colon cancer, mother in 34sand father in 670s  GI is recommending outpatient work-up after this acute illness.   TPN was started on 05/16/2020 due to poor p.o. intake and stopped on 05/24/2020.  05/24/20.  Patient become altered  with confabulation-suspecting Wernicke's encephalopathy and he was started on high-dose thiamine.  MRI without any acute change.  Subjective: Patient was feeling better when seen today.  He ate all his meals yesterday including today's  breakfast.  He was able to take multiple ensures in between.  Seems happy and ready to move on.  Assessment & Plan:   Principal Problem:   Acute alcoholic pancreatitis Active Problems:   Alcohol withdrawal seizure with delirium (HCC)   Hyponatremia   Hypokalemia   AKI (acute kidney injury) ([place])   Elevated CK   Heme positive stool   Thrombocytopenia (HCC)   Acute metabolic encephalopathy   Severe malnutrition (HCC)   Sinus tachycardia   Ascites   Hypotension   Protein-calorie malnutrition, severe  Acute encephalopathy.  Resolved   Patient developed acute encephalopathy by pulling all his lines and trying to get out of bed.  he was confabulating.  Concern of Wernicke's  encephalopathy.  Patient was getting some thiamine with TPN. MRI without any new change. -He was started on high-dose thiamine 500 mg 3 times a day for 2 days followed by 250 mg daily for another 3 days.  Day 2 today.  Sepsis with septic shock.  Resolved  Transferred to ICU on 05/15/2020 due to hypothermia and hypotension requiring Levophed.  Now off the Levophed and maintaining his blood pressure.  ID was also consulted and they started him on meropenem for concern of infected pseudocyst. Patient afebrile with improvement in leukocytosis. Completed the course of meropenem.  Blood transfusion reaction.  Patient did develop some rash and become short of breath with some pulmonary vascular congestion yesterday while getting blood transfusion. -Tolerated another transfusion after that without any problem.. Total of 3.5 units of PRBC given during current hospitalization.  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   CT venogram on 7/3 and a repeat on 7/5 with persistent thrombosis at the confluence of sinuses and left transverse dural venous sinus.  Neurology is following and they start him on heparin infusion.  Patient also had acute right cerebellum  and a possible old infarcts involving parietal lobes and left occipital region, seen on MRI done on 05/12/2020.  Due to his worsening lower extremity rash which is also involving some upper extremities there is some concern of vasculitis.  Patient also has some rash/thrush in his mouth. His differential is broad at this time which includes rheumatologic disorder/vasculitis/Behcet's syndrome/malignancy.  He underwent an extensive rheumatologic and hypercoagulable work-up. Elevated ESR and CRP.  CK has been normalized. Dr. Jefm Bryant from rheumatology saw the patient and agrees about his confusing picture as he is not fitting on any definite thing.  His rash and CVT can be explained with Behcet's syndrome but that is a clinical diagnosis.  No oral or genital ulceration.  No vision or eye changes.  Vasculitis or panniculitis can be another possibility.  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 -Patient was started on Solu-Medrol 80 mg daily after the skin biopsy, he received 3 days of Solu-Medrol, was discontinued once vasculitis ruled out. -RPR and hepatitis panel negative. -ANCA titers negative. -Cardiolipin antibodies negative. -Antithrombin III activity reduced at 59 with reduced AC [place] at 30.  Which is more consistent with increased consumption as patient has active thrombosis.  He will need a repeat testing after acute illness and needs a follow-up with hematologist as an outpatient. -Protein S levels within normal limit. -CA 19-9 mildly elevated at  81-which is very nonspecific. -Lupus anticoagulant panel with PTT lupus anticoagulant within normal limit and DRV VT mildly elevated at 51.2, which can occur by a deficiency of one of the common path risk factor include 2, 5, 10 or fibrinogen.  Will need a repeat testing after acute illness. -Prothrombin gene mutation negative -Vitamin D level reduced at 7.39-we started him on repletion. -Folic acid was  reduced-started him on supplement. -B1 within normal limit-Getting high-dose thiamine. -Protein C levels were low at 56. - factor V Leiden,  levels are pending. -Switch Lovenox with Xarelto-there are some studies which shows favorable outcome in patients with CVT with Xarelto.  More studies with Coumadin but patient might not stay compliant.  He will need 6 months of anticoagulation. -Still need outpatient oncology work-up as there is no other obvious cause found for CVT.  Erythematous skin rash/pancreatic panniculitis.  Seems improving Vasculitis ruled out. -Skin biopsy was done by dermatology on 05/23/20-consistent with pancreatic panniculitis-enzymatic fat necrosis secondary to severe pancreatitis.  Lower extremity edema.  Improving  Multifactorial as he received a lot of fluid along with lower albumin.  He did receive some doses of IV Lasix which resulted in improvement of edema. -I will give him some albumin. -Discontinue Lasix.  Thrombocytopenia.  Resolved .  HIT antibodies are negative.  Anemia.  Hemoglobin improving, 9.4 today.  Received total of 3.5 units of PRBC.   Per wife he did had some bleeding per rectum at home.  Not aware of any obvious bleeding while in the hospital.  Patient is critically ill also.  Had some mild hematuria on UA done on 05/16/2020.  No gross hematuria.  CT abdomen without any retroperitoneal bleed and repeat paracentesis on 05/23/2020 was without any blood. -Check anemia panel-iron studies consistent with anemia of chronic disease.  Low folic acid, normal K02. -Start him on folic acid supplement. -Continue to monitor and transfuse below 7.  Chronic pancreatitis with pseudocyst formation.  Present on admission.  CT abdomen without any gallbladder abnormalities but did had multiple cyst in pancreatic head and body.  Lipase peaked at 1543, now trending down.  He test today with lipase of 84. -CT abdomen was repeated 7/13 due to concern of retroperitoneal  bleeding.  It shows stable pancreatitis changes with pseudocyst.  Stable ascites and pleural effusion. -Continue supportive care.  Severe protein calorie malnutrition.  POA, most likely secondary to chronic alcohol abuse.  Patient does not want enteric feeding with NG tube.  BMI of 23.58. TPN from 05/16/2020 till 05/24/2020. -Dietitian has put their recommendations with improving p.o. intake. -Continue monitoring daily CMP, magnesium, phosphorous. -Encourage p.o. intake.  Nutritional deficiencies.  Secondary to alcohol abuse. Patient so far found to have deficiencies of vitamin A, vitamin C, vitamin D and copper.  Zinc is within lower normal limit.  Thiamine within normal limit. -Ask pharmacy for proper supplement.  Transaminitis.  Started improving. He was on TPN and also has an history of alcohol abuse. -Continue to monitor.  TSH elevation.  T4 normal.  No acute concern at this time.  Diarrhea.  Resolved.  Sinus tachycardia. Resolved. -Continue metoprolol 25 mg twice daily.  Pancreatic ascites.  Paracentesis labs are consistent with pancreatic ascites.  CT abdomen with mild fatty infiltrate, no cirrhosis.  Patient underwent paracentesis twice 1 on 05/07/2020 and second on 05/16/2020.  Culture remain negative.  Repeat CT abdomen on 7/13 with stable ascites. -Repeat paracentesis -on 7/14, again consistent with pancreatic ascites with negative cultures.  Pressure injuries.  High risk due to severe protein calorie malnutrition and inability to move.  Getting some unstageable breakdown on his sacrum and bilateral heels. -Continue to monitor. -Frequent position changes.  AKI.  Resolved Patient had AKI on admission.  Most likely prerenal as creatinine normalized with IV hydration. -Continue to monitor.  Electrolyte abnormalities.  Has multiple electrolyte abnormalities during the course of illness which include hypokalemia, hypomagnesemia and hyponatremia which has been resolved. -Continue  to monitor BMP, magnesium and phosphate.  Depression.  Patient appears depressed.  Per wife he is feeling very down since the death of his father and drinking a lot in order to overcome his depression. -Psychiatry consult-started him on Zyprexa and Cymbalta on 05/25/2020  Palliative care consult.  Palliative care was consulted to discuss goals of care.  Patient and his wife will continue aggressive measures and to treat treatables.  Code was changed to DNR. Palliative signed off today-as we decided to pursue aggressive measures.  Patient is critically ill with multiorgan involvement and is very high risk for deterioration and death.  Seems improving now.   Objective: Vitals:   05/26/20 0746 05/26/20 0800 05/26/20 0900 05/26/20 1351  BP: 125/84   (!) 121/95  Pulse:  (!) 103 88 87  Resp:  (!) '23 19 14  ' Temp: 98 F (36.7 C)   98 F (36.7 C)  TempSrc: Oral   Oral  SpO2:    100%  Weight:    64.5 kg  Height:        Intake/Output Summary (Last 24 hours) at 05/26/2020 1428 Last data filed at 05/26/2020 0500 Gross per 24 hour  Intake 240 ml  Output 2250 ml  Net -2010 ml   Filed Weights   05/25/20 0200 05/26/20 0500 05/26/20 1351  Weight: 67.9 kg 65.9 kg 64.5 kg    Examination:  General exam: Well-developed gentleman, appears calm and comfortable. Respiratory system: Clear bilaterally, respiratory effort normal. Cardiovascular system: S1 & S2 heard, RRR. Gastrointestinal system: Soft, nontender, mildly distended, bowel sounds positive. Central nervous system: Alert and oriented x3. No focal neurological deficits. Extremities: Trace LE edema, no cyanosis, pulses intact and symmetrical. Skin: Erythematous rash involving all extremities seems improving. Psychiatry: Judgement and insight appear normal.  DVT prophylaxis: Heparin Code Status: DNR Family Communication: Discussed with wife at bedside. Disposition Plan:  Status is: Inpatient  Remains inpatient appropriate  because:Inpatient level of care appropriate due to severity of illness   Dispo: The patient is from: Home              Anticipated d/c is to: To be determined              Anticipated d/c date is: 2-3 days              Patient currently is not medically stable to d/c.  Patient seems improving.  Should be able to either go home with home health or SNF in 2 to 3 days after completion of IV thiamine. PT is recommending SNF, patient and wife has not decided yet.  Consultants:   Neurology  Gastroenterology  General surgery  Palliative care  PCCM  Rheumatology  Dermatology  Procedures:  Antimicrobials:   Data Reviewed: I have personally reviewed following labs and imaging studies  CBC: Recent Labs  Lab 05/21/20 0434 05/21/20 0434 05/22/20 0155 05/22/20 0155 05/23/20 0458 05/23/20 1229 05/24/20 0409 05/25/20 0312 05/26/20 0514  WBC 15.7*   < > 19.0*  --  15.6*  --  16.9* 10.3 8.5  NEUTROABS 11.9*  --   --   --   --   --   --   --   --   HGB 7.6*   < > 6.8*  --  6.9*  --  7.9* 8.5* 9.4*  HCT 23.0*   < > 20.7*   < > 20.7* 20.8* 23.9* 25.7* 28.8*  MCV 92.4   < > 94.1  --  92.8  --  98.0 92.4 94.7  PLT 79*   < > 85*  --  88*  --  130* 178 226   < > = values in this interval not displayed.   Basic Metabolic Panel: Recent Labs  Lab 05/21/20 0434 05/21/20 0434 05/22/20 0155 05/22/20 0155 05/23/20 0458 05/24/20 0409 05/24/20 0550 05/25/20 0312 05/26/20 0514  NA 137   < > 132*   < > 134* 128* 132* 134* 135  K 5.0   < > 4.7   < > 4.5 5.9* 4.3 3.9 3.9  CL 103   < > 98   < > 95* 93* 94* 97* 95*  CO2 28   < > 27   < > '27 27 30 28 28  ' GLUCOSE 133*   < > 131*   < > 131* 939* 174* 86 93  BUN 18   < > 18   < > '19 18 18 19 16  ' CREATININE 0.57*   < > 0.54*   < > 0.52* 0.51* 0.50* 0.60* 0.63  CALCIUM 8.3*   < > 8.1*   < > 8.3* 8.3* 8.2* 8.3* 8.8*  MG 1.7   < > 1.8  --  1.7 2.7*  --  1.6* 1.9  PHOS 4.7*  --  4.9*  --  4.6 4.5  --  5.0*  --    < > = values in this interval  not displayed.   GFR: Estimated Creatinine Clearance: 118.7 mL/min (by C-G formula based on SCr of 0.63 mg/dL). Liver Function Tests: Recent Labs  Lab 05/22/20 0155 05/23/20 0458 05/24/20 0409 05/25/20 0312 05/26/20 0514  AST 138* 79* 80* 89* 54*  ALT 105* 87* 83* 112* 93*  ALKPHOS 169* 150* 137* 138* 123  BILITOT 0.6 0.5 0.5 0.6 0.4  PROT 4.7* 4.9* 4.7* 5.1* 5.4*  ALBUMIN 1.4* 1.4* 1.5* 1.7* 2.1*   Recent Labs  Lab 05/21/20 1454 05/26/20 0514  LIPASE 241* 84*   Recent Labs  Lab 05/23/20 1532  AMMONIA 12   Coagulation Profile: No results for input(s): INR, PROTIME in the last 168 hours. Cardiac Enzymes: Recent Labs  Lab 05/21/20 1454 05/22/20 0155  CKTOTAL 30* 23*   BNP (last 3 results) No results for input(s): PROBNP in the last 8760 hours. HbA1C: No results for input(s): HGBA1C in the last 72 hours. CBG: Recent Labs  Lab 05/25/20 0021 05/25/20 0532 05/26/20 0112 05/26/20 0542 05/26/20 1119  GLUCAP 122* 84 95 73 89   Lipid Profile: No results for input(s): CHOL, HDL, LDLCALC, TRIG, CHOLHDL, LDLDIRECT in the last 72 hours. Thyroid Function Tests: No results for input(s): TSH, T4TOTAL, FREET4, T3FREE, THYROIDAB in the last 72 hours. Anemia Panel: No results for input(s): VITAMINB12, FOLATE, FERRITIN, TIBC, IRON, RETICCTPCT in the last 72 hours. Sepsis Labs: Recent Labs  Lab 05/21/20 0434 05/22/20 0155 05/23/20 0458 05/23/20 1515  PROCALCITON 1.03 1.24 1.34  --   LATICACIDVEN  --   --   --  1.0    Recent Results (from the past 240 hour(s))  Gastrointestinal Panel by  PCR , Stool     Status: None   Collection Time: 05/17/20  3:06 PM   Specimen: Stool  Result Value Ref Range Status   Campylobacter species NOT DETECTED NOT DETECTED Final   Plesimonas shigelloides NOT DETECTED NOT DETECTED Final   Salmonella species NOT DETECTED NOT DETECTED Final   Yersinia enterocolitica NOT DETECTED NOT DETECTED Final   Vibrio species NOT DETECTED NOT  DETECTED Final   Vibrio cholerae NOT DETECTED NOT DETECTED Final   Enteroaggregative E coli (EAEC) NOT DETECTED NOT DETECTED Final   Enteropathogenic E coli (EPEC) NOT DETECTED NOT DETECTED Final   Enterotoxigenic E coli (ETEC) NOT DETECTED NOT DETECTED Final   Shiga like toxin producing E coli (STEC) NOT DETECTED NOT DETECTED Final   Shigella/Enteroinvasive E coli (EIEC) NOT DETECTED NOT DETECTED Final   Cryptosporidium NOT DETECTED NOT DETECTED Final   Cyclospora cayetanensis NOT DETECTED NOT DETECTED Final   Entamoeba histolytica NOT DETECTED NOT DETECTED Final   Giardia lamblia NOT DETECTED NOT DETECTED Final   Adenovirus F40/41 NOT DETECTED NOT DETECTED Final   Astrovirus NOT DETECTED NOT DETECTED Final   Norovirus GI/GII NOT DETECTED NOT DETECTED Final   Rotavirus A NOT DETECTED NOT DETECTED Final   Sapovirus (I, II, IV, and V) NOT DETECTED NOT DETECTED Final    Comment: Performed at [facility], [address] [place], [place] [address]  C Difficile Quick Screen (NO PCR Reflex)     Status: None   Collection Time: 05/17/20  3:06 PM   Specimen: STOOL  Result Value Ref Range Status   C Diff antigen NEGATIVE NEGATIVE Final   C Diff toxin NEGATIVE NEGATIVE Final   C Diff interpretation No C. difficile detected.  Final    Comment: Performed at [facility], [address] [place], [place] [address]  Aerobic Culture (superficial specimen)     Status: None   Collection Time: 05/21/20  2:30 PM   Specimen: Wound  Result Value Ref Range Status   Specimen Description   Final    WOUND Performed at [facility], [address] [place], [place] [address]    Special Requests   Final    NONE Performed at [facility], [place], [place] [address]    Gram Stain NO WBC SEEN NO ORGANISMS SEEN   Final   Culture   Final    NO GROWTH 2 DAYS Performed at [facility], [place] [address], [place], [place] [address]    Report  Status 05/25/2020 FINAL  Final  Peritoneal Fluid culture ( includes gram stain)     Status: None (Preliminary result)   Collection Time: 05/23/20 11:41 AM   Specimen: Peritoneal Washings; Peritoneal Fluid  Result Value Ref Range Status   Specimen Description   Final    PERITONEAL Performed at [facility], [address] [place], [place] [address]    Special Requests   Final    Normal Performed at [facility], [address] [place], [place] [address]    Gram Stain   Final    WBC PRESENT,BOTH PMN AND MONONUCLEAR NO ORGANISMS SEEN CYTOSPIN SMEAR    Culture   Final    NO GROWTH 3 DAYS Performed at [facility], [place] [address], [place], [place] [address]    Report Status PENDING  Incomplete  Body fluid culture     Status: None (Preliminary result)   Collection Time: 05/23/20  1:29 PM  Specimen: PATH Cytology Peritoneal fluid  Result Value Ref Range Status   Specimen Description   Final    PERITONEAL Performed at [facility], [address] [place], [place] [address]    Special Requests   Final    NONE Performed at [facility], [address] [place], [place] [address]    Gram Stain   Final    WBC PRESENT,BOTH PMN AND MONONUCLEAR NO ORGANISMS SEEN CYTOSPIN SMEAR    Culture   Final    NO GROWTH 3 DAYS Performed at [facility], [place] [address], [place], [place] [address]    Report Status PENDING  Incomplete  Fungus Culture With Stain     Status: None (Preliminary result)   Collection Time: 05/23/20  1:29 PM   Specimen: PATH Cytology Peritoneal fluid  Result Value Ref Range Status   Fungus Stain Final report  Final    Comment: (NOTE) Performed At: [facility] [place], [place] [id number] Rush Farmer MD [id number]    Fungus (Mycology) Culture PENDING  Incomplete   Fungal Source PERITONEAL  Final    Comment: Performed at [facility], [address] [place], [place]  [address]  Aerobic Culture (superficial specimen)     Status: None   Collection Time: 05/23/20  1:29 PM   Specimen: PATH Cytology Peritoneal fluid  Result Value Ref Range Status   Specimen Description   Final    PERITONEAL Performed at [facility], [address] [place], [place] [address]    Special Requests   Final    NONE Performed at [facility], [address] [place], [place] [address]    Gram Stain   Final    RARE WBC PRESENT, PREDOMINANTLY MONONUCLEAR NO ORGANISMS SEEN    Culture   Final    NO GROWTH 3 DAYS Performed at [facility], [place] [address] [place], [place] [address]    Report Status 05/26/2020 FINAL  Final  Fungus Culture Result     Status: None   Collection Time: 05/23/20  1:29 PM  Result Value Ref Range Status   Result 1 Comment  Final    Comment: (NOTE) KOH/Calcofluor preparation:  no fungus observed. Performed At: [facility] [place], [place] [id number] Rush Farmer MD [id number]      Radiology Studies: MR BRAIN W WO CONTRAST  Result Date: 05/24/2020 CLINICAL DATA:  Encephalopathy suspect Wernicke's . History of cerebral infarct and dural venous thrombosis. EXAM: MRI HEAD WITHOUT AND WITH CONTRAST TECHNIQUE: Multiplanar, multiecho pulse sequences of the brain and surrounding structures were obtained without and with intravenous contrast. CONTRAST:  14m GADAVIST GADOBUTROL 1 MMOL/ML IV SOLN COMPARISON:  MRI head 05/15/2020, 05/12/2020 FINDINGS: Brain: Motion degraded study with multiple repeat attempts. Negative for acute infarct. Recent infarcts in the right cerebellum and right parietal lobe and left parietal lobe have resolved. No parenchymal hemorrhage. Clot is present in the region of the torcular. No mass lesion or hydrocephalus. 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 Postcontrast images degraded by extensive motion. Allowing for this, no abnormal enhancement identified. There is filling defect in the left  transverse sinus and torcula compatible with residual thrombus. Vascular: Filling defect in the torcula and left transverse sinus compatible with thrombus as noted previously. Normal arterial flow voids. Skull and upper cervical spine: No focal skeletal lesion. Sinuses/Orbits: Paranasal sinuses clear.  Negative orbit Other: Motion degraded study IMPRESSION: Negative for acute infarct. Resolving infarcts in the  right cerebellum and parietal lobes bilaterally Persistent clot in the torcula and left transverse sinus similar to that seen on prior studies. Image quality degraded by motion. Electronically Signed   By: Franchot Gallo M.D.   On: 05/24/2020 18:58    Scheduled Meds:  sodium chloride   Intravenous Once   vitamin C  500 mg Oral BID   Chlorhexidine Gluconate Cloth  6 each Topical Daily   dronabinol  2.5 mg Oral BID AC   DULoxetine  20 mg Oral Daily   feeding supplement (ENSURE ENLIVE)  237 mL Oral TID BM   ferrous [id number] C-folic acid  1 capsule Oral BID PC   folic acid  1 mg Intravenous Daily   Gerhardt's butt cream   Topical TID   insulin aspart  0-9 Units Subcutaneous Q6H   metoprolol tartrate  25 mg Oral BID   multivitamin with minerals  1 tablet Oral Daily   multivitamin-lutein  1 capsule Oral Daily   OLANZapine zydis  5 mg Oral QHS   pantoprazole (PROTONIX) IV  40 mg Intravenous Q12H   rivaroxaban  20 mg Oral Q supper   sodium chloride flush  3 mL Intravenous Q12H   vitamin A  10,000 Units Oral Daily   Vitamin D (Ergocalciferol)  50,000 Units Oral Q7 days   zinc sulfate  220 mg Oral Daily   Continuous Infusions:  sodium chloride Stopped (05/13/20 1628)   albumin human 12.5 g (05/26/20 1044)   thiamine 552m in normal saline (564m IVPB 500 mg (05/26/20 1145)   Followed by   [SDerrill MemoN 05/27/2020] sodium chloride 0.9 % 50 mL with thiamine (B-1) 250 mg infusion       LOS: 23 days   Time spent: 45 minutes.  More than 50% of that time was  spent in direct patient care and discussing the plan with nursing staff and wife.  SuLorella NimrodMD [facility]  If 7PM-7AM, please contact night-coverage [email or web address]  05/26/2020, 2:28 PM   This record has been created using DrSystems analystErrors have been sought and corrected,but may not always be located. Such creation errors do not reflect on the standard of care.

## 2020-05-27 ENCOUNTER — Encounter: Payer: Self-pay | Admitting: Dermatology

## 2020-05-27 LAB — COMPREHENSIVE METABOLIC PANEL
ALT: 65 U/L — ABNORMAL HIGH (ref 0–44)
AST: 31 U/L (ref 15–41)
Albumin: 2.2 g/dL — ABNORMAL LOW (ref 3.5–5.0)
Alkaline Phosphatase: 104 U/L (ref 38–126)
Anion gap: 11 (ref 5–15)
BUN: 10 mg/dL (ref 6–20)
CO2: 26 mmol/L (ref 22–32)
Calcium: 8.6 mg/dL — ABNORMAL LOW (ref 8.9–10.3)
Chloride: 98 mmol/L (ref 98–111)
Creatinine, Ser: 0.51 mg/dL — ABNORMAL LOW (ref 0.61–1.24)
GFR calc Af Amer: >60 mL/min (ref >60)
GFR calc non Af Amer: >60 mL/min (ref >60)
Glucose, Bld: 98 mg/dL (ref 70–99)
Potassium: 3.7 mmol/L (ref 3.5–5.1)
Sodium: 135 mmol/L (ref 135–145)
Total Bilirubin: 0.8 mg/dL (ref 0.3–1.2)
Total Protein: 5.4 g/dL — ABNORMAL LOW (ref 6.5–8.1)

## 2020-05-27 LAB — BODY FLUID CULTURE
Culture: NO GROWTH
Culture: NO GROWTH
Special Requests: NORMAL

## 2020-05-27 LAB — CBC
HCT: 27.6 % — ABNORMAL LOW (ref 39.0–52.0)
Hemoglobin: 9.4 g/dL — ABNORMAL LOW (ref 13.0–17.0)
MCH: 31.4 pg (ref 26.0–34.0)
MCHC: 34.1 g/dL (ref 30.0–36.0)
MCV: 92.3 fL (ref 80.0–100.0)
Platelets: 270 10*3/uL (ref 150–400)
RBC: 2.99 MIL/uL — ABNORMAL LOW (ref 4.22–5.81)
RDW: 14.8 % (ref 11.5–15.5)
WBC: 9.2 10*3/uL (ref 4.0–10.5)
nRBC: 0 % (ref 0.0–0.2)

## 2020-05-27 LAB — MAGNESIUM: Magnesium: 1.6 mg/dL — ABNORMAL LOW (ref 1.7–2.4)

## 2020-05-27 LAB — PHOSPHORUS: Phosphorus: 5.4 mg/dL — ABNORMAL HIGH (ref 2.5–4.6)

## 2020-05-27 MED ORDER — MAGNESIUM SULFATE 4 GM/100ML IV SOLN
4.0000 g | Freq: Once | INTRAVENOUS | Status: AC
  Administered 2020-05-27: 4 g via INTRAVENOUS
  Filled 2020-05-27: qty 100

## 2020-05-27 MED ORDER — FUROSEMIDE 10 MG/ML IJ SOLN
20.0000 mg | Freq: Once | INTRAMUSCULAR | Status: AC
  Administered 2020-05-27: 12:00:00 20 mg via INTRAVENOUS
  Filled 2020-05-27: qty 2

## 2020-05-27 MED ORDER — PANTOPRAZOLE SODIUM 40 MG PO TBEC
40.0000 mg | DELAYED_RELEASE_TABLET | Freq: Every day | ORAL | Status: DC
  Administered 2020-05-28 – 2020-05-31 (×4): 40 mg via ORAL
  Filled 2020-05-27 (×4): qty 1

## 2020-05-27 NOTE — Consult Note (Signed)
PHARMACY CONSULT NOTE - FOLLOW UP  Pharmacy Consult for Electrolyte Monitoring and Replacement   Recent Labs: Potassium (mmol/L)  Date Value  05/26/2020 3.9   Magnesium (mg/dL)  Date Value  90/24/0973 1.9   Calcium (mg/dL)  Date Value  53/29/9242 8.8 (L)   Albumin (g/dL)  Date Value  68/34/1962 2.1 (L)   Phosphorus (mg/dL)  Date Value  22/97/9892 5.0 (H)   Sodium (mmol/L)  Date Value  05/26/2020 135     Assessment: Patient is a 34 y/o M with history of EtOH abuse and depression who is admitted with encephalopathy, cerebral venous sinus thrombosis, pancreatitis, severe malnutrition, nutritional deficiencies. Patient was on TPN 7/7 thru 7/15. Pharmacy has been consulted to assist with electrolyte replacement.  Goal of Therapy:  Electrolytes within normal limits  Plan:  Electrolytes remain stable. Will sign off.  Pricilla Riffle, PharmD 05/27/2020 7:20 AM

## 2020-05-27 NOTE — Progress Notes (Signed)
PROGRESS NOTE    Shane Kossman Sr.  ALP:[id number] DOB: 1986/01/13 DOA: 05/03/2020 PCP: Lorelee Market, MD   Brief Narrative:  34 year old gentleman with no medical history, chronic alcoholism, has been drinking nonstop about 12 packs of beer daily since age of 29 presented to the ED on  05/03/2020 for evaluation of progressive confusion and lethargy. He also had episode of unresponsiveness in the triage area. As per patient's fianc, patient stopped drinking about 24 hours prior to arrival and started behaving altered, confused. Also reported multiple episodes diarrhea, but no nausea or vomiting.  In ED he had 1 witnessed seizure. CT abdomen with concern of pancreatitis with elevated lipase, bilateral pleural effusions and ascites.  He was initially admitted for management of acute alcohol withdrawal and alcoholic pancreatitis.  Patient has a very complicated course of illness, underwent alcohol withdrawal.  There was some concern of SBP.  He was evaluated by surgeons on 05/06/2020 for concern of acute compartment syndrome and abdomen and found to have some intra-abdominal hypertension which was managed conservatively by placing Foley catheter and NG tube.  Paracentesis with cell count of 822, neutrophils of 79%.  Lipase was elevated at 53,000, LDH 526 and amylase of 10,000.  Cultures were negative.  Consistent with pancreatic ascites.  Repeat paracentesis was also consistent with pancreatic ascites and cultures remain negative.   Lipase continued to get worse initially with peaked at 1543, now trending down.  MRCP and repeat CT abdomens with some attenuations in pancreatic head and body, some improvement in edema.      Neurology was consulted on 05/10/2020 due to worsening altered mental status. 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  Patient did not receive any coronavirus [employer] vaccine.  He was initiated on  heparin.  EEG showed generalized background slowing.  No epileptiform activity was noted. A repeat MRI brain without contrast done on 05/15/2020 showed Residual abnormal diffusion restriction over the right posterior parietal lobe and right cerebellum, consistent with expected evolution of subacute venous infarcts.  It was started on Heparin by neurology, later transitioned to Xarelto.  Will need a 45-monthof anticoagulation.  On 05/16/2019 1 in the evening he became hypotensive and hypothermic with worsening leukocytosis and development of lactic acidosis.  He was transferred to ICU on 05/16/2020 due to the concern for developing septic shock.  He was started on pressors.  His lactate was 2 1 pro-Cal was 1.84 WBC was 40.4 and hemoglobin was 11.9.  CT abdomen done on 05/16/2020 showed peripancreatic edema with multiple pseudocyst.  The dominant pseudocyst has decreased to 2.2 cm.  There was extensive ascites with loculation around the liver right gutter and pelvis. Patient has been started on meropenem.  Although urine, blood and ascitic fluid cultures remain negative.  C. difficile colitis and GI pathogens were negative.  He completed a 9-day course of meropenem due to concern of infected pseudocyst.  Patient did developed decreased in hemoglobin and there was some concern of GI bleed.  Patient and his wife did endorse some blood in his stool before coming to the hospital.  Patient has a strong family history of colon cancer where both of his parents died of colon cancer, mother in 473sand father in 615s  GI is recommending outpatient work-up after this acute illness.   TPN was started on 05/16/2020 due to poor p.o. intake and stopped on 05/24/2020.  05/24/20.  Patient become altered  with confabulation-suspecting Wernicke's encephalopathy and he was started on high-dose thiamine.  MRI without any acute change.  Since then he remained stable, continue to improve and transferred to [facility] on  05/26/2020.  Subjective: Patient has no new complaint today.  According to him he is drinking and eating okay.  He likes Ensure.  Per nursing staff he was able to go to bedside commode.  He wants to go home with home health services instead of rehab.  Assessment & Plan:   Principal Problem:   Acute alcoholic pancreatitis Active Problems:   Alcohol withdrawal seizure with delirium (HCC)   Hyponatremia   Hypokalemia   AKI (acute kidney injury) ([place])   Elevated CK   Heme positive stool   Thrombocytopenia (HCC)   Acute metabolic encephalopathy   Severe malnutrition (HCC)   Sinus tachycardia   Ascites   Hypotension   Protein-calorie malnutrition, severe  Acute encephalopathy.  Resolved   Patient developed acute encephalopathy by pulling all his lines and trying to get out of bed.  he was confabulating.  Concern of Wernicke's  encephalopathy.  Patient was getting some thiamine with TPN. MRI without any new change. -He was started on high-dose thiamine 500 mg 3 times a day for 2 days followed by 250 mg daily for another 3 days.  Day 3 today.  Sepsis with septic shock.  Resolved  Transferred to ICU on 05/15/2020 due to hypothermia and hypotension requiring Levophed.  Now off the Levophed and maintaining his blood pressure.  ID was also consulted and they started him on meropenem for concern of infected pseudocyst. Patient afebrile with improvement in leukocytosis. Completed the course of meropenem.  Blood transfusion reaction.  Patient did develop some rash and become short of breath with some pulmonary vascular congestion yesterday while getting blood transfusion. -Tolerated another transfusion after that without any problem.. Total of 3.5 units of PRBC given during current hospitalization.  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   CT venogram on 7/3 and a repeat on 7/5 with persistent thrombosis at the confluence  of sinuses and left transverse dural venous sinus.  Neurology is following and they start him on heparin infusion.  Patient also had acute right cerebellum and a possible old infarcts involving parietal lobes and left occipital region, seen on MRI done on 05/12/2020.  Due to his worsening lower extremity rash which is also involving some upper extremities there is some concern of vasculitis.  Patient also has some rash/thrush in his mouth. His differential is broad at this time which includes rheumatologic disorder/vasculitis/Behcet's syndrome/malignancy.  He underwent an extensive rheumatologic and hypercoagulable work-up. Elevated ESR and CRP.  CK has been normalized. Dr. Jefm Bryant from rheumatology saw the patient and agrees about his confusing picture as he is not fitting on any definite thing.  His rash and CVT can be explained with Behcet's syndrome but that is a clinical diagnosis.  No oral or genital ulceration.  No vision or eye changes.  Vasculitis or panniculitis can be another possibility.  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 -Patient was started on Solu-Medrol 80 mg daily after the skin biopsy, he received 3 days of Solu-Medrol, was discontinued once vasculitis ruled out. -RPR and hepatitis panel negative. -ANCA titers negative. -Cardiolipin antibodies negative. -Antithrombin III activity reduced at 59 with reduced AC [place] at 30.  Which is more consistent with increased consumption as patient has active thrombosis.  He will need a  repeat testing after acute illness and needs a follow-up with hematologist as an outpatient. -Protein S levels within normal limit. -CA 19-9 mildly elevated at 81-which is very nonspecific. -Lupus anticoagulant panel with PTT lupus anticoagulant within normal limit and DRV VT mildly elevated at 51.2, which can occur by a deficiency of one of the common path risk factor include 2, 5, 10 or fibrinogen.  Will need a  repeat testing after acute illness. -Prothrombin gene mutation negative -Vitamin D level reduced at 7.39-we started him on repletion. -Folic acid was reduced-started him on supplement. -B1 within normal limit-Getting high-dose thiamine. -Protein C levels were low at 56. - factor V Leiden,  levels are pending. -Switch Lovenox with Xarelto-there are some studies which shows favorable outcome in patients with CVT with Xarelto.  More studies with Coumadin but patient might not stay compliant.  He will need 6 months of anticoagulation. -Still need outpatient oncology work-up as there is no other obvious cause found for CVT.  Erythematous skin rash/pancreatic panniculitis.  Seems improving Vasculitis ruled out. -Skin biopsy was done by dermatology on 05/23/20-consistent with pancreatic panniculitis-enzymatic fat necrosis secondary to severe pancreatitis.  Lower extremity edema.  Improving  Multifactorial as he received a lot of fluid along with lower albumin.  He did receive some doses of IV Lasix which resulted in improvement of edema. -Continue albumin. -Give him another dose of IV Lasix, as there was little more swelling around his ankles.  Thrombocytopenia.  Resolved .  HIT antibodies are negative.  Anemia.  Hemoglobin improving, 9.4 and stable.  Received total of 3.5 units of PRBC.   Per wife he did had some bleeding per rectum at home.  Not aware of any obvious bleeding while in the hospital.  Patient is critically ill also.  Had some mild hematuria on UA done on 05/16/2020.  No gross hematuria.  CT abdomen without any retroperitoneal bleed and repeat paracentesis on 05/23/2020 was without any blood. -Check anemia panel-iron studies consistent with anemia of chronic disease.  Low folic acid, normal G26. -Received IV folic acid for 5 days-we will discontinue IV folic acid and continue with p.o. supplement. -Continue to monitor and transfuse below 7.  Chronic pancreatitis with pseudocyst  formation.  Present on admission.  CT abdomen without any gallbladder abnormalities but did had multiple cyst in pancreatic head and body.  Lipase peaked at 1543, now trending down. Current lipase of 84. -CT abdomen was repeated 7/13 due to concern of retroperitoneal bleeding.  It shows stable pancreatitis changes with pseudocyst.  Stable ascites and pleural effusion. -Continue supportive care.  Severe protein calorie malnutrition.  POA, most likely secondary to chronic alcohol abuse.   BMI of 22.27 TPN from 05/16/2020 till 05/24/2020. -Dietitian has put their recommendations with improving p.o. intake. -Continue monitoring daily CMP, magnesium, phosphorous. -Encourage p.o. intake.  Nutritional deficiencies.  Secondary to alcohol abuse. Patient so far found to have deficiencies of vitamin A, vitamin C, vitamin D and copper.  Zinc is within lower normal limit.  Thiamine within normal limit. -Continue supplement.  Transaminitis.  Resolved. He was on TPN and also has an history of alcohol abuse. -Continue to monitor.  TSH elevation.  T4 normal.  No acute concern at this time.  Diarrhea.  Resolved.  Sinus tachycardia. Resolved. -Continue metoprolol 25 mg twice daily.  Pancreatic ascites.  Paracentesis labs are consistent with pancreatic ascites.  CT abdomen with mild fatty infiltrate, no cirrhosis.  Patient underwent paracentesis twice 1 on 05/07/2020 and second on  05/16/2020.  Culture remain negative.  Repeat CT abdomen on 7/13 with stable ascites. -Repeat paracentesis -on 7/14, again consistent with pancreatic ascites with negative cultures.  Pressure injuries.  High risk due to severe protein calorie malnutrition and inability to move.  Getting some unstageable breakdown on his sacrum and bilateral heels. -Continue to monitor. -Frequent position changes.  AKI.  Resolved Patient had AKI on admission.  Most likely prerenal as creatinine normalized with IV hydration. -Continue to  monitor.  Electrolyte abnormalities.  Has multiple electrolyte abnormalities during the course of illness which include hypokalemia, hypomagnesemia and hyponatremia which has been resolved. Magnesium level low again today-replete magnesium and monitor. -Continue to monitor BMP, magnesium and phosphate.  Depression.  Patient appears depressed.  Per wife he is feeling very down since the death of his father and drinking a lot in order to overcome his depression. -Psychiatry consult-started him on Zyprexa and Cymbalta on 05/25/2020  Palliative care consult.  Palliative care was consulted to discuss goals of care.  Patient and his wife will continue aggressive measures and to treat treatables.  Code was changed to DNR. Palliative signed off -as we decided to pursue aggressive measures.  Patient is critically ill with multiorgan involvement and is very high risk for deterioration and death.  Seems improving now.   Objective: Vitals:   05/26/20 2055 05/27/20 0050 05/27/20 0455 05/27/20 0822  BP: 138/85 129/90 131/89 140/85  Pulse: (!) 102 93 93 (!) 107  Resp: '15 16 15 14  ' Temp: 97.9 F (36.6 C) 97.8 F (36.6 C) 98 F (36.7 C) 98.2 F (36.8 C)  TempSrc: Oral Oral  Oral  SpO2: 95% 96% 97% 96%  Weight:      Height:        Intake/Output Summary (Last 24 hours) at 05/27/2020 1124 Last data filed at 05/27/2020 0615 Gross per 24 hour  Intake 100 ml  Output 1200 ml  Net -1100 ml   Filed Weights   05/25/20 0200 05/26/20 0500 05/26/20 1351  Weight: 67.9 kg 65.9 kg 64.5 kg    Examination:  General exam: Well-developed gentleman, appears calm and comfortable. Respiratory system: Clear bilaterally, respiratory effort normal. Cardiovascular system: S1 & S2 heard, RRR. Gastrointestinal system: Soft, nontender, mildly distended, bowel sounds positive. Central nervous system: Alert and oriented x3. No focal neurological deficits. Extremities: Trace LE edema, 1+ around ankle, no cyanosis,  pulses intact and symmetrical. Skin: Erythematous rash involving all extremities seems improving.  Pressure injuries around heel. Psychiatry: Judgement and insight appear normal.  DVT prophylaxis: Xarelto Code Status: DNR Family Communication: No family at bedside today. Disposition Plan:  Status is: Inpatient  Remains inpatient appropriate because:Inpatient level of care appropriate due to severity of illness   Dispo: The patient is from: Home              Anticipated d/c is to: To be determined              Anticipated d/c date is: 2 days              Patient currently is not medically stable to d/c.  Patient seems improving.  Should be able to either go home with home health or SNF in 2 to 3 days after completion of IV thiamine. PT is recommending SNF, patient wants to go home with home health services.  Consultants:   Neurology  Gastroenterology  General surgery  Palliative care  PCCM  Rheumatology  Dermatology  Procedures:  Antimicrobials:   Data  Reviewed: I have personally reviewed following labs and imaging studies  CBC: Recent Labs  Lab 05/21/20 0434 05/22/20 0155 05/23/20 0458 05/23/20 0458 05/23/20 1229 05/24/20 0409 05/25/20 0312 05/26/20 0514 05/27/20 0650  WBC 15.7*   < > 15.6*  --   --  16.9* 10.3 8.5 9.2  NEUTROABS 11.9*  --   --   --   --   --   --   --   --   HGB 7.6*   < > 6.9*  --   --  7.9* 8.5* 9.4* 9.4*  HCT 23.0*   < > 20.7*   < > 20.8* 23.9* 25.7* 28.8* 27.6*  MCV 92.4   < > 92.8  --   --  98.0 92.4 94.7 92.3  PLT 79*   < > 88*  --   --  130* 178 226 270   < > = values in this interval not displayed.   Basic Metabolic Panel: Recent Labs  Lab 05/22/20 0155 05/22/20 0155 05/23/20 0458 05/23/20 0458 05/24/20 0409 05/24/20 0550 05/25/20 0312 05/26/20 0514 05/27/20 0650  NA 132*   < > 134*   < > 128* 132* 134* 135 135  K 4.7   < > 4.5   < > 5.9* 4.3 3.9 3.9 3.7  CL 98   < > 95*   < > 93* 94* 97* 95* 98  CO2 27   < > 27   <  > '27 30 28 28 26  ' GLUCOSE 131*   < > 131*   < > 939* 174* 86 93 98  BUN 18   < > 19   < > '18 18 19 16 10  ' CREATININE 0.54*   < > 0.52*   < > 0.51* 0.50* 0.60* 0.63 0.51*  CALCIUM 8.1*   < > 8.3*   < > 8.3* 8.2* 8.3* 8.8* 8.6*  MG 1.8   < > 1.7  --  2.7*  --  1.6* 1.9 1.6*  PHOS 4.9*  --  4.6  --  4.5  --  5.0*  --  5.4*   < > = values in this interval not displayed.   GFR: Estimated Creatinine Clearance: 118.7 mL/min (A) (by C-G formula based on SCr of 0.51 mg/dL (L)). Liver Function Tests: Recent Labs  Lab 05/23/20 0458 05/24/20 0409 05/25/20 0312 05/26/20 0514 05/27/20 0650  AST 79* 80* 89* 54* 31  ALT 87* 83* 112* 93* 65*  ALKPHOS 150* 137* 138* 123 104  BILITOT 0.5 0.5 0.6 0.4 0.8  PROT 4.9* 4.7* 5.1* 5.4* 5.4*  ALBUMIN 1.4* 1.5* 1.7* 2.1* 2.2*   Recent Labs  Lab 05/21/20 1454 05/26/20 0514  LIPASE 241* 84*   Recent Labs  Lab 05/23/20 1532  AMMONIA 12   Coagulation Profile: No results for input(s): INR, PROTIME in the last 168 hours. Cardiac Enzymes: Recent Labs  Lab 05/21/20 1454 05/22/20 0155  CKTOTAL 30* 23*   BNP (last 3 results) No results for input(s): PROBNP in the last 8760 hours. HbA1C: No results for input(s): HGBA1C in the last 72 hours. CBG: Recent Labs  Lab 05/25/20 0021 05/25/20 0532 05/26/20 0112 05/26/20 0542 05/26/20 1119  GLUCAP 122* 84 95 73 89   Lipid Profile: No results for input(s): CHOL, HDL, LDLCALC, TRIG, CHOLHDL, LDLDIRECT in the last 72 hours. Thyroid Function Tests: No results for input(s): TSH, T4TOTAL, FREET4, T3FREE, THYROIDAB in the last 72 hours. Anemia Panel: No results for input(s): VITAMINB12, FOLATE, FERRITIN, TIBC,  IRON, RETICCTPCT in the last 72 hours. Sepsis Labs: Recent Labs  Lab 05/21/20 0434 05/22/20 0155 05/23/20 0458 05/23/20 1515  PROCALCITON 1.03 1.24 1.34  --   LATICACIDVEN  --   --   --  1.0    Recent Results (from the past 240 hour(s))  Gastrointestinal Panel by PCR , Stool     Status:  None   Collection Time: 05/17/20  3:06 PM   Specimen: Stool  Result Value Ref Range Status   Campylobacter species NOT DETECTED NOT DETECTED Final   Plesimonas shigelloides NOT DETECTED NOT DETECTED Final   Salmonella species NOT DETECTED NOT DETECTED Final   Yersinia enterocolitica NOT DETECTED NOT DETECTED Final   Vibrio species NOT DETECTED NOT DETECTED Final   Vibrio cholerae NOT DETECTED NOT DETECTED Final   Enteroaggregative E coli (EAEC) NOT DETECTED NOT DETECTED Final   Enteropathogenic E coli (EPEC) NOT DETECTED NOT DETECTED Final   Enterotoxigenic E coli (ETEC) NOT DETECTED NOT DETECTED Final   Shiga like toxin producing E coli (STEC) NOT DETECTED NOT DETECTED Final   Shigella/Enteroinvasive E coli (EIEC) NOT DETECTED NOT DETECTED Final   Cryptosporidium NOT DETECTED NOT DETECTED Final   Cyclospora cayetanensis NOT DETECTED NOT DETECTED Final   Entamoeba histolytica NOT DETECTED NOT DETECTED Final   Giardia lamblia NOT DETECTED NOT DETECTED Final   Adenovirus F40/41 NOT DETECTED NOT DETECTED Final   Astrovirus NOT DETECTED NOT DETECTED Final   Norovirus GI/GII NOT DETECTED NOT DETECTED Final   Rotavirus A NOT DETECTED NOT DETECTED Final   Sapovirus (I, II, IV, and V) NOT DETECTED NOT DETECTED Final    Comment: Performed at [facility], [address] [place], [place] [address]  C Difficile Quick Screen (NO PCR Reflex)     Status: None   Collection Time: 05/17/20  3:06 PM   Specimen: STOOL  Result Value Ref Range Status   C Diff antigen NEGATIVE NEGATIVE Final   C Diff toxin NEGATIVE NEGATIVE Final   C Diff interpretation No C. difficile detected.  Final    Comment: Performed at [facility], [address] [place], [place] [address]  Aerobic Culture (superficial specimen)     Status: None   Collection Time: 05/21/20  2:30 PM   Specimen: Wound  Result Value Ref Range Status   Specimen Description   Final    WOUND Performed at [facility], [address], [place], [place] [address]    Special Requests   Final    NONE Performed at [facility], [place], [place] [address]    Gram Stain NO WBC SEEN NO ORGANISMS SEEN   Final   Culture   Final    NO GROWTH 2 DAYS Performed at [facility], [place] [address], [place], [place] [address]    Report Status 05/25/2020 FINAL  Final  Peritoneal Fluid culture ( includes gram stain)     Status: None   Collection Time: 05/23/20 11:41 AM   Specimen: Peritoneal Washings; Peritoneal Fluid  Result Value Ref Range Status   Specimen Description   Final    PERITONEAL Performed at [facility], [address], [place], [place] [address]    Special Requests   Final    Normal Performed at [facility], [place], [place] [address]    Gram Stain   Final    WBC PRESENT,BOTH PMN AND MONONUCLEAR NO ORGANISMS SEEN CYTOSPIN SMEAR  Culture   Final    NO GROWTH 3 DAYS Performed at [facility], [place] [address] [place], [place] [address]    Report Status 05/27/2020 FINAL  Final  Body fluid culture     Status: None   Collection Time: 05/23/20  1:29 PM   Specimen: PATH Cytology Peritoneal fluid  Result Value Ref Range Status   Specimen Description   Final    PERITONEAL Performed at [facility], [address], [place], [place] [address]    Special Requests   Final    NONE Performed at [facility], [address] [place], [place] [address]    Gram Stain   Final    WBC PRESENT,BOTH PMN AND MONONUCLEAR NO ORGANISMS SEEN CYTOSPIN SMEAR    Culture   Final    NO GROWTH 3 DAYS Performed at [facility], [place] [address], [place], [place] [address]    Report Status 05/27/2020 FINAL  Final  Fungus Culture With Stain     Status: None (Preliminary result)   Collection Time: 05/23/20  1:29 PM   Specimen: PATH Cytology Peritoneal fluid  Result Value Ref Range Status   Fungus Stain Final  report  Final    Comment: (NOTE) Performed At: [facility] [place], [place] [id number] Rush Farmer MD [id number]    Fungus (Mycology) Culture PENDING  Incomplete   Fungal Source PERITONEAL  Final    Comment: Performed at [facility], [address], [place], [place] [address]  Aerobic Culture (superficial specimen)     Status: None   Collection Time: 05/23/20  1:29 PM   Specimen: PATH Cytology Peritoneal fluid  Result Value Ref Range Status   Specimen Description   Final    PERITONEAL Performed at [facility], [address] [place], [place] [address]    Special Requests   Final    NONE Performed at [facility], [address] [place], [place] [address]    Gram Stain   Final    RARE WBC PRESENT, PREDOMINANTLY MONONUCLEAR NO ORGANISMS SEEN    Culture   Final    NO GROWTH 3 DAYS Performed at [facility], [place] [address], [place], [place] [address]    Report Status 05/26/2020 FINAL  Final  Fungus Culture Result     Status: None   Collection Time: 05/23/20  1:29 PM  Result Value Ref Range Status   Result 1 Comment  Final    Comment: (NOTE) KOH/Calcofluor preparation:  no fungus observed. Performed At: [facility] [place], [place] [id number] Rush Farmer MD QZ:[id number]      Radiology Studies: No results found.  Scheduled Meds: . sodium chloride   Intravenous Once  . vitamin C  500 mg Oral BID  . Chlorhexidine Gluconate Cloth  6 each Topical Daily  . dronabinol  2.5 mg Oral BID AC  . DULoxetine  20 mg Oral Daily  . feeding supplement (ENSURE ENLIVE)  237 mL Oral TID BM  . ferrous [id number] C-folic acid  1 capsule Oral BID PC  . furosemide  20 mg Intravenous Once  . Gerhardt's butt cream   Topical TID  . metoprolol tartrate  25 mg Oral BID  . multivitamin with minerals  1 tablet Oral Daily  . multivitamin-lutein  1 capsule Oral Daily  . OLANZapine zydis  5  mg Oral QHS  . [START ON 05/28/2020] pantoprazole  40 mg Oral Daily  .  rivaroxaban  20 mg Oral Q supper  . sodium chloride flush  3 mL Intravenous Q12H  . vitamin A  10,000 Units Oral Daily  . Vitamin D (Ergocalciferol)  50,000 Units Oral Q7 days  . zinc sulfate  220 mg Oral Daily   Continuous Infusions: . sodium chloride Stopped (05/13/20 1628)  . albumin human 12.5 g (05/27/20 1054)  . magnesium sulfate bolus IVPB    . sodium chloride 0.9 % 50 mL with thiamine (B-1) 250 mg infusion       LOS: 24 days   Time spent: 40 minutes.  More than 50% of that time was spent in direct patient care and discussing the plan with nursing staff and wife.  Lorella Nimrod, MD [facility]  If 7PM-7AM, please contact night-coverage [email or web address]  05/27/2020, 11:24 AM   This record has been created using Systems analyst. Errors have been sought and corrected,but may not always be located. Such creation errors do not reflect on the standard of care.

## 2020-05-28 DIAGNOSIS — L8990 Pressure ulcer of unspecified site, unspecified stage: Secondary | ICD-10-CM | POA: Insufficient documentation

## 2020-05-28 LAB — FACTOR 5 LEIDEN

## 2020-05-28 LAB — MAGNESIUM: Magnesium: 1.9 mg/dL (ref 1.7–2.4)

## 2020-05-28 LAB — COMPREHENSIVE METABOLIC PANEL
ALT: 52 U/L — ABNORMAL HIGH (ref 0–44)
AST: 26 U/L (ref 15–41)
Albumin: 2.5 g/dL — ABNORMAL LOW (ref 3.5–5.0)
Alkaline Phosphatase: 106 U/L (ref 38–126)
Anion gap: 5 (ref 5–15)
BUN: 12 mg/dL (ref 6–20)
CO2: 28 mmol/L (ref 22–32)
Calcium: 8.6 mg/dL — ABNORMAL LOW (ref 8.9–10.3)
Chloride: 101 mmol/L (ref 98–111)
Creatinine, Ser: 0.49 mg/dL — ABNORMAL LOW (ref 0.61–1.24)
GFR calc Af Amer: >60 mL/min (ref >60)
GFR calc non Af Amer: >60 mL/min (ref >60)
Glucose, Bld: 108 mg/dL — ABNORMAL HIGH (ref 70–99)
Potassium: 3.8 mmol/L (ref 3.5–5.1)
Sodium: 134 mmol/L — ABNORMAL LOW (ref 135–145)
Total Bilirubin: 0.6 mg/dL (ref 0.3–1.2)
Total Protein: 5.8 g/dL — ABNORMAL LOW (ref 6.5–8.1)

## 2020-05-28 LAB — CBC
HCT: 28.6 % — ABNORMAL LOW (ref 39.0–52.0)
Hemoglobin: 9.7 g/dL — ABNORMAL LOW (ref 13.0–17.0)
MCH: 31.2 pg (ref 26.0–34.0)
MCHC: 33.9 g/dL (ref 30.0–36.0)
MCV: 92 fL (ref 80.0–100.0)
Platelets: 338 10*3/uL (ref 150–400)
RBC: 3.11 MIL/uL — ABNORMAL LOW (ref 4.22–5.81)
RDW: 15.1 % (ref 11.5–15.5)
WBC: 9.9 10*3/uL (ref 4.0–10.5)
nRBC: 0 % (ref 0.0–0.2)

## 2020-05-28 MED ORDER — METOPROLOL TARTRATE 50 MG PO TABS
50.0000 mg | ORAL_TABLET | Freq: Two times a day (BID) | ORAL | Status: DC
  Administered 2020-05-28 – 2020-05-31 (×7): 50 mg via ORAL
  Filled 2020-05-28 (×7): qty 1

## 2020-05-28 NOTE — Progress Notes (Signed)
PROGRESS NOTE    Shane Jenkins.  OXB:[id number] DOB: October 11, 1986 DOA: 05/03/2020 PCP: Lorelee Market, MD   Brief Narrative:  34 year old gentleman with no medical history, chronic alcoholism, has been drinking nonstop about 12 packs of beer daily since age of 62 presented to the ED on  05/03/2020 for evaluation of progressive confusion and lethargy. He also had episode of unresponsiveness in the triage area. As per patient's fianc, patient stopped drinking about 24 hours prior to arrival and started behaving altered, confused. Also reported multiple episodes diarrhea, but no nausea or vomiting.  In ED he had 1 witnessed seizure. CT abdomen with concern of pancreatitis with elevated lipase, bilateral pleural effusions and ascites.  He was initially admitted for management of acute alcohol withdrawal and alcoholic pancreatitis.  Patient has a very complicated course of illness, underwent alcohol withdrawal.  There was some concern of SBP.  He was evaluated by surgeons on 05/06/2020 for concern of acute compartment syndrome and abdomen and found to have some intra-abdominal hypertension which was managed conservatively by placing Foley catheter and NG tube.  Paracentesis with cell count of 822, neutrophils of 79%.  Lipase was elevated at 53,000, LDH 526 and amylase of 10,000.  Cultures were negative.  Consistent with pancreatic ascites.  Repeat paracentesis was also consistent with pancreatic ascites and cultures remain negative.   Lipase continued to get worse initially with peaked at 1543, now trending down.  MRCP and repeat CT abdomens with some attenuations in pancreatic head and body, some improvement in edema.      Neurology was consulted on 05/10/2020 due to worsening altered mental status. 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  Patient did not receive any coronavirus [employer] vaccine.  He was initiated on  heparin.  EEG showed generalized background slowing.  No epileptiform activity was noted. A repeat MRI brain without contrast done on 05/15/2020 showed Residual abnormal diffusion restriction over the right posterior parietal lobe and right cerebellum, consistent with expected evolution of subacute venous infarcts.  It was started on Heparin by neurology, later transitioned to Xarelto.  Will need a 59-monthof anticoagulation.  On 05/16/2019 1 in the evening he became hypotensive and hypothermic with worsening leukocytosis and development of lactic acidosis.  He was transferred to ICU on 05/16/2020 due to the concern for developing septic shock.  He was started on pressors.  His lactate was 2 1 pro-Cal was 1.84 WBC was 40.4 and hemoglobin was 11.9.  CT abdomen done on 05/16/2020 showed peripancreatic edema with multiple pseudocyst.  The dominant pseudocyst has decreased to 2.2 cm.  There was extensive ascites with loculation around the liver right gutter and pelvis. Patient has been started on meropenem.  Although urine, blood and ascitic fluid cultures remain negative.  C. difficile colitis and GI pathogens were negative.  He completed a 9-day course of meropenem due to concern of infected pseudocyst.  Patient did developed decreased in hemoglobin and there was some concern of GI bleed.  Patient and his wife did endorse some blood in his stool before coming to the hospital.  Patient has a strong family history of colon cancer where both of his parents died of colon cancer, mother in [id number] father in 671s  GI is recommending outpatient work-up after this acute illness.   TPN was started on 05/16/2020 due to poor p.o. intake and stopped on 05/24/2020.  05/24/20.  Patient become altered  with confabulation-suspecting Wernicke's encephalopathy and he was started on high-dose thiamine.  MRI without any acute change.  Since then he remained stable, continue to improve and transferred to [facility] on  05/26/2020.  Subjective: Patient has no new complaint today.  He was resting comfortably in bed when seen today.  We discussed regarding removing Foley catheter today and trying to void without it.  Breakfast was at bedside, per patient he will eat it a little while.  Assessment & Plan:   Principal Problem:   Acute alcoholic pancreatitis Active Problems:   Alcohol withdrawal seizure with delirium (HCC)   Hyponatremia   Hypokalemia   AKI (acute kidney injury) ([facility])   Elevated CK   Heme positive stool   Thrombocytopenia (HCC)   Acute metabolic encephalopathy   Severe malnutrition (HCC)   Sinus tachycardia   Ascites   Hypotension   Protein-calorie malnutrition, severe   Pressure injury of skin  Acute encephalopathy.  Resolved   Patient developed acute encephalopathy by pulling all his lines and trying to get out of bed.  he was confabulating.  Concern of Wernicke's  encephalopathy.  Patient was getting some thiamine with TPN. MRI without any new change. -He was started on high-dose thiamine 500 mg 3 times a day for 2 days followed by 250 mg daily for another 3 days.  Day 4 today.  Sepsis with septic shock.  Resolved  Transferred to ICU on 05/15/2020 due to hypothermia and hypotension requiring Levophed.  Now off the Levophed and maintaining his blood pressure.  ID was also consulted and they started him on meropenem for concern of infected pseudocyst. Patient afebrile with improvement in leukocytosis. Completed the course of meropenem.  Blood transfusion reaction.  Resolved  Patient did develop some rash and become short of breath with some pulmonary vascular congestion yesterday while getting blood transfusion. -Tolerated another transfusion after that without any problem.. Total of 3.5 units of PRBC given during current hospitalization.  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   CT venogram on 7/3 and a  repeat on 7/5 with persistent thrombosis at the confluence of sinuses and left transverse dural venous sinus.  Neurology is following and they start him on heparin infusion.  Patient also had acute right cerebellum and a possible old infarcts involving parietal lobes and left occipital region, seen on MRI done on 05/12/2020.  Due to his worsening lower extremity rash which is also involving some upper extremities there is some concern of vasculitis.  Patient also has some rash/thrush in his mouth. His differential is broad at this time which includes rheumatologic disorder/vasculitis/Behcet's syndrome/malignancy.  He underwent an extensive rheumatologic and hypercoagulable work-up. Elevated ESR and CRP.  CK has been normalized. Dr. Jefm Bryant from rheumatology saw the patient and agrees about his confusing picture as he is not fitting on any definite thing.  His rash and CVT can be explained with Behcet's syndrome but that is a clinical diagnosis.  No oral or genital ulceration.  No vision or eye changes.  Vasculitis or panniculitis can be another possibility.  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 -Patient was started on Solu-Medrol 80 mg daily after the skin biopsy, he received 3 days of Solu-Medrol, was discontinued once vasculitis ruled out. -RPR and hepatitis panel negative. -ANCA titers negative. -Cardiolipin antibodies negative. -Antithrombin III activity reduced at 59 with reduced AC Rendon at 30.  Which is more consistent with increased consumption as patient has  active thrombosis.  He will need a repeat testing after acute illness and needs a follow-up with hematologist as an outpatient. -Protein S levels within normal limit. -CA 19-9 mildly elevated at 81-which is very nonspecific. -Lupus anticoagulant panel with PTT lupus anticoagulant within normal limit and DRV VT mildly elevated at 51.2, which can occur by a deficiency of one of the common path  risk factor include 2, 5, 10 or fibrinogen.  Will need a repeat testing after acute illness. -Prothrombin gene mutation negative -Vitamin D level reduced at 7.39-we started him on repletion. -Folic acid was reduced-started him on supplement. -B1 within normal limit-Getting high-dose thiamine. -Protein C levels were low at 56. - factor V Leiden.  No mutation. -Switch Lovenox with Xarelto-there are some studies which shows favorable outcome in patients with CVT with Xarelto.  More studies with Coumadin but patient might not stay compliant.  He will need 6 months of anticoagulation. -Still need outpatient oncology work-up as there is no other obvious cause found for CVT. -Patient also need repeat protein C levels once recovered from this acute illness.  Erythematous skin rash/pancreatic panniculitis.  Seems improving Vasculitis ruled out. -Skin biopsy was done by dermatology on 05/23/20-consistent with pancreatic panniculitis-enzymatic fat necrosis secondary to severe pancreatitis.  Lower extremity edema.  Improving  Multifactorial as he received a lot of fluid along with lower albumin.  He did receive some doses of IV Lasix which resulted in improvement of edema. -Continue albumin. -Give him another dose of IV Lasix, as there was little more swelling around his ankles.  Thrombocytopenia.  Resolved .  HIT antibodies are negative.  Anemia.  Hemoglobin improving, 9.4 and stable.  Received total of 3.5 units of PRBC.   Per wife he did had some bleeding per rectum at home.  Not aware of any obvious bleeding while in the hospital.  Patient is critically ill also.  Had some mild hematuria on UA done on 05/16/2020.  No gross hematuria.  CT abdomen without any retroperitoneal bleed and repeat paracentesis on 05/23/2020 was without any blood. -Check anemia panel-iron studies consistent with anemia of chronic disease.  Low folic acid, normal A21. -Received IV folic acid for 5 days-we will discontinue IV  folic acid and continue with p.o. supplement. -Continue to monitor and transfuse below 7.  Chronic pancreatitis with pseudocyst formation.  Present on admission.  CT abdomen without any gallbladder abnormalities but did had multiple cyst in pancreatic head and body.  Lipase peaked at 1543, now trending down. Current lipase of 84. -CT abdomen was repeated 7/13 due to concern of retroperitoneal bleeding.  It shows stable pancreatitis changes with pseudocyst.  Stable ascites and pleural effusion. -Continue supportive care.  Severe protein calorie malnutrition.  POA, most likely secondary to chronic alcohol abuse.   BMI of 22.27 TPN from 05/16/2020 till 05/24/2020. -Dietitian has put their recommendations with improving p.o. intake. -Continue monitoring daily CMP, magnesium, phosphorous. -Encourage p.o. intake.  Nutritional deficiencies.  Secondary to alcohol abuse. Patient so far found to have deficiencies of vitamin A, vitamin C, vitamin D and copper.  Zinc is within lower normal limit.  Thiamine within normal limit. -Continue supplement.  Transaminitis.  Resolved. He was on TPN and also has an history of alcohol abuse. -Continue to monitor.  TSH elevation.  T4 normal.  No acute concern at this time.  Diarrhea.  Resolved.  Sinus tachycardia.  Heart rate again in low 100 today.  Blood pressure mildly elevated. -Increase metoprolol to 50 mg twice  daily and monitor.  Pancreatic ascites.  Paracentesis labs are consistent with pancreatic ascites.  CT abdomen with mild fatty infiltrate, no cirrhosis.  Patient underwent paracentesis twice 1 on 05/07/2020 and second on 05/16/2020.  Culture remain negative.  Repeat CT abdomen on 7/13 with stable ascites. -Repeat paracentesis -on 7/14, again consistent with pancreatic ascites with negative cultures.  Pressure injuries.  High risk due to severe protein calorie malnutrition and inability to move.  Getting some unstageable breakdown on his sacrum and  bilateral heels. -Wound care consult-appreciate their recommendations. -Continue to monitor. -Frequent position changes.  AKI.  Resolved Patient had AKI on admission.  Most likely prerenal as creatinine normalized with IV hydration. -Continue to monitor.  Electrolyte abnormalities.  Has multiple electrolyte abnormalities during the course of illness which include hypokalemia, hypomagnesemia and hyponatremia which has been resolved. -Continue to monitor BMP, magnesium and phosphate.  Depression.  Patient appears depressed.  Per wife he is feeling very down since the death of his father and drinking a lot in order to overcome his depression. -Psychiatry consult-started him on Zyprexa and Cymbalta on 05/25/2020  Palliative care consult.  Palliative care was consulted to discuss goals of care.  Patient and his wife will continue aggressive measures and to treat treatables.  Code was changed to DNR. Palliative signed off -as we decided to pursue aggressive measures.  Physical deconditioning.  And has severe physical deconditioning secondary to acute illness. PT are recommending SNF placement.  Placement will be hard with his insurance.  We will try getting him maximum home health services and keep him for next couple of days to get a physical therapy so he can transfer himself from bed to chair and bedside commode himself.  Patient is critically ill with multiorgan involvement and is very high risk for deterioration and death.  Seems improving now.   Objective: Vitals:   05/28/20 0259 05/28/20 0408 05/28/20 0918 05/28/20 1208  BP: (!) 143/89  126/83 114/71  Pulse: (!) 101  91 94  Resp: '15  16 16  ' Temp: 98.9 F (37.2 C)  (!) 97.3 F (36.3 C) 97.6 F (36.4 C)  TempSrc: Oral  Oral Oral  SpO2: 95%  100% 100%  Weight:  64.9 kg    Height:        Intake/Output Summary (Last 24 hours) at 05/28/2020 1507 Last data filed at 05/28/2020 0348 Gross per 24 hour  Intake --  Output 1975 ml  Net  -1975 ml   Filed Weights   05/26/20 0500 05/26/20 1351 05/28/20 0408  Weight: 65.9 kg 64.5 kg 64.9 kg    Examination:  General.  Chronically ill-appearing gentleman,   in no acute distress. Pulmonary.  Lungs clear bilaterally, normal respiratory effort. CV.  Regular rate and rhythm, no JVD, rub or murmur. Abdomen.  Soft, nontender, nondistended, BS positive. CNS.  Alert and oriented x3.  No focal neurologic deficit. Extremities.  Trace LE edema, no cyanosis, pulses intact and symmetrical. Psychiatry.  Judgment and insight appears normal.  DVT prophylaxis: Xarelto Code Status: DNR Family Communication: No family at bedside today.  Discussed with wife on phone. Disposition Plan:  Status is: Inpatient  Remains inpatient appropriate because:Inpatient level of care appropriate due to severity of illness   Dispo: The patient is from: Home              Anticipated d/c is to: To be determined              Anticipated d/c date is:  2 days              Patient currently is not medically stable to d/c.  Patient seems improving.  PT recommended SNF placement.  Difficult placement due to insurance.  We will keep for next couple of days to work with physical therapy while in the hospital and then discharged home with home health services.   Consultants:   Neurology  Gastroenterology  General surgery  Palliative care  PCCM  Rheumatology  Dermatology  Procedures:  Antimicrobials:   Data Reviewed: I have personally reviewed following labs and imaging studies  CBC: Recent Labs  Lab 05/24/20 0409 05/25/20 0312 05/26/20 0514 05/27/20 0650 05/28/20 0416  WBC 16.9* 10.3 8.5 9.2 9.9  HGB 7.9* 8.5* 9.4* 9.4* 9.7*  HCT 23.9* 25.7* 28.8* 27.6* 28.6*  MCV 98.0 92.4 94.7 92.3 92.0  PLT 130* 178 226 270 097   Basic Metabolic Panel: Recent Labs  Lab 05/22/20 0155 05/22/20 0155 05/23/20 0458 05/23/20 0458 05/24/20 0409 05/24/20 0409 05/24/20 0550 05/25/20 0312  05/26/20 0514 05/27/20 0650 05/28/20 0416  NA 132*   < > 134*   < > 128*   < > 132* 134* 135 135 134*  K 4.7   < > 4.5   < > 5.9*   < > 4.3 3.9 3.9 3.7 3.8  CL 98   < > 95*   < > 93*   < > 94* 97* 95* 98 101  CO2 27   < > 27   < > 27   < > '30 28 28 26 28  ' GLUCOSE 131*   < > 131*   < > 939*   < > 174* 86 93 98 108*  BUN 18   < > 19   < > 18   < > '18 19 16 10 12  ' CREATININE 0.54*   < > 0.52*   < > 0.51*   < > 0.50* 0.60* 0.63 0.51* 0.49*  CALCIUM 8.1*   < > 8.3*   < > 8.3*   < > 8.2* 8.3* 8.8* 8.6* 8.6*  MG 1.8   < > 1.7   < > 2.7*  --   --  1.6* 1.9 1.6* 1.9  PHOS 4.9*  --  4.6  --  4.5  --   --  5.0*  --  5.4*  --    < > = values in this interval not displayed.   GFR: Estimated Creatinine Clearance: 119.4 mL/min (A) (by C-G formula based on SCr of 0.49 mg/dL (L)). Liver Function Tests: Recent Labs  Lab 05/24/20 0409 05/25/20 0312 05/26/20 0514 05/27/20 0650 05/28/20 0416  AST 80* 89* 54* 31 26  ALT 83* 112* 93* 65* 52*  ALKPHOS 137* 138* 123 104 106  BILITOT 0.5 0.6 0.4 0.8 0.6  PROT 4.7* 5.1* 5.4* 5.4* 5.8*  ALBUMIN 1.5* 1.7* 2.1* 2.2* 2.5*   Recent Labs  Lab 05/26/20 0514  LIPASE 84*   Recent Labs  Lab 05/23/20 1532  AMMONIA 12   Coagulation Profile: No results for input(s): INR, PROTIME in the last 168 hours. Cardiac Enzymes: Recent Labs  Lab 05/22/20 0155  CKTOTAL 23*   BNP (last 3 results) No results for input(s): PROBNP in the last 8760 hours. HbA1C: No results for input(s): HGBA1C in the last 72 hours. CBG: Recent Labs  Lab 05/25/20 0021 05/25/20 0532 05/26/20 0112 05/26/20 0542 05/26/20 1119  GLUCAP 122* 84 95 73 89   Lipid Profile: No results  for input(s): CHOL, HDL, LDLCALC, TRIG, CHOLHDL, LDLDIRECT in the last 72 hours. Thyroid Function Tests: No results for input(s): TSH, T4TOTAL, FREET4, T3FREE, THYROIDAB in the last 72 hours. Anemia Panel: No results for input(s): VITAMINB12, FOLATE, FERRITIN, TIBC, IRON, RETICCTPCT in the last 72  hours. Sepsis Labs: Recent Labs  Lab 05/22/20 0155 05/23/20 0458 05/23/20 1515  PROCALCITON 1.24 1.34  --   LATICACIDVEN  --   --  1.0    Recent Results (from the past 240 hour(s))  Aerobic Culture (superficial specimen)     Status: None   Collection Time: 05/21/20  2:30 PM   Specimen: Wound  Result Value Ref Range Status   Specimen Description   Final    WOUND Performed at [facility], [address] [place], [place] [address]    Special Requests   Final    NONE Performed at [facility], [place], [place] [address]    Gram Stain NO WBC SEEN NO ORGANISMS SEEN   Final   Culture   Final    NO GROWTH 2 DAYS Performed at [facility], [place] [address], [place], [place] [address]    Report Status 05/25/2020 FINAL  Final  Peritoneal Fluid culture ( includes gram stain)     Status: None   Collection Time: 05/23/20 11:41 AM   Specimen: Peritoneal Washings; Peritoneal Fluid  Result Value Ref Range Status   Specimen Description   Final    PERITONEAL Performed at [facility], [address] [place], [place] [address]    Special Requests   Final    Normal Performed at [facility], [address] [place], [place] [address]    Gram Stain   Final    WBC PRESENT,BOTH PMN AND MONONUCLEAR NO ORGANISMS SEEN CYTOSPIN SMEAR    Culture   Final    NO GROWTH 3 DAYS Performed at [facility], [place] [address], [place], [place] [address]    Report Status 05/27/2020 FINAL  Final  Body fluid culture     Status: None   Collection Time: 05/23/20  1:29 PM   Specimen: PATH Cytology Peritoneal fluid  Result Value Ref Range Status   Specimen Description   Final    PERITONEAL Performed at [facility], [address], [place], [place] [address]    Special Requests   Final    NONE Performed at [facility], [address] [place], [place] [address]    Gram Stain   Final    WBC PRESENT,BOTH PMN  AND MONONUCLEAR NO ORGANISMS SEEN CYTOSPIN SMEAR    Culture   Final    NO GROWTH 3 DAYS Performed at [facility], [place] [address] [place], [place] [address]    Report Status 05/27/2020 FINAL  Final  Fungus Culture With Stain     Status: None (Preliminary result)   Collection Time: 05/23/20  1:29 PM   Specimen: PATH Cytology Peritoneal fluid  Result Value Ref Range Status   Fungus Stain Final report  Final    Comment: (NOTE) Performed At: [facility] [place], [place] [id number] Rush Farmer MD Jenkins:[id number]    Fungus (Mycology) Culture PENDING  Incomplete   Fungal Source PERITONEAL  Final    Comment: Performed at [facility], [address] [place], [place] [address]  Aerobic Culture (superficial specimen)     Status: None   Collection Time: 05/23/20  1:29 PM  Specimen: PATH Cytology Peritoneal fluid  Result Value Ref Range Status   Specimen Description   Final    PERITONEAL Performed at [facility], [address], [place], [place] [address]    Special Requests   Final    NONE Performed at [facility], [address] [place], [place] [address]    Gram Stain   Final    RARE WBC PRESENT, PREDOMINANTLY MONONUCLEAR NO ORGANISMS SEEN    Culture   Final    NO GROWTH 3 DAYS Performed at [facility], [place] [address], [place], [place] [address]    Report Status 05/26/2020 FINAL  Final  Fungus Culture Result     Status: None   Collection Time: 05/23/20  1:29 PM  Result Value Ref Range Status   Result 1 Comment  Final    Comment: (NOTE) KOH/Calcofluor preparation:  no fungus observed. Performed At: [facility] [place], [place] [id number] Rush Farmer MD PV:[id number]      Radiology Studies: No results found.  Scheduled Meds: . sodium chloride   Intravenous Once  . vitamin C  500 mg Oral BID  . Chlorhexidine Gluconate Cloth  6 each Topical Daily  . dronabinol  2.5 mg  Oral BID AC  . DULoxetine  20 mg Oral Daily  . feeding supplement (ENSURE ENLIVE)  237 mL Oral TID BM  . ferrous [id number] C-folic acid  1 capsule Oral BID PC  . Gerhardt's butt cream   Topical TID  . metoprolol tartrate  50 mg Oral BID  . multivitamin with minerals  1 tablet Oral Daily  . multivitamin-lutein  1 capsule Oral Daily  . OLANZapine zydis  5 mg Oral QHS  . pantoprazole  40 mg Oral Daily  . rivaroxaban  20 mg Oral Q supper  . sodium chloride flush  3 mL Intravenous Q12H  . vitamin A  10,000 Units Oral Daily  . Vitamin D (Ergocalciferol)  50,000 Units Oral Q7 days  . zinc sulfate  220 mg Oral Daily   Continuous Infusions: . sodium chloride Stopped (05/13/20 1628)  . albumin human 12.5 g (05/28/20 1149)  . sodium chloride 0.9 % 50 mL with thiamine (B-1) 250 mg infusion 100 mL/hr at 05/28/20 0921     LOS: 25 days   Time spent: 40 minutes.  More than 50% of that time was spent in direct patient care and discussing the plan with nursing staff and wife.  Lorella Nimrod, MD [facility]  If 7PM-7AM, please contact night-coverage [email or web address]  05/28/2020, 3:07 PM   This record has been created using Systems analyst. Errors have been sought and corrected,but may not always be located. Such creation errors do not reflect on the standard of care.

## 2020-05-28 NOTE — Evaluation (Signed)
Occupational Therapy Evaluation Patient Details Name: Shane Beehler Sr. MRN: [id number] DOB: 07-26-1986 Today's Date: 05/28/2020    History of Present Illness 34 y/o male who has a long history of alcohol abuse who presented to the ED (6/24) in a state of withdrawal and was witnessed to have a seizure; no seizure activity reported since. He was found to have acute alcoholic pancreatitis. Pt has been drinking since he was 18 and per chart, drinks two 12-packs a day. 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 05/15/2020 showed Residual abnormal diffusion restriction over the right posterior parietal lobe and right cerebellum, consistent with expected evolution of subacute venous infarcts.   Clinical Impression   Shane Jenkins was seen for OT evaluation this date. Prior to hospital admission, pt was MOD I for mobility and ADLs. Pt lives c fiancee, 61 yo son, and best friend. Pt presents to acute OT demonstrating impaired ADL performance, functional cognition, and functional mobility 2/2 decreased safety awareness, functional strength/balance deficits, decreased activity tolerance, and impulsivity. Per fiancee at bed side pt has had waxing/waning cognition, previously reported seeing monkey on helicopter - not appreciated this session. Pt currently requires SETUP + SBA don L sock seated EOB - pt demonstrated high amplitude movements and decreased safety awareness (head coming close to rail) -  MAX A don R sock at EOB. MIN A + RW for ADL t/f - required BUE support on RW. Tolerated ~1 min c unilateral RUE support on walker, tolerated ~ 30 sec c unilateral LUE support on walker (pt reports increased R ankle pain c weight shifting). Pt would benefit from skilled OT to address noted impairments and functional limitations (see below for any additional details) in order to maximize safety and independence while minimizing falls risk and caregiver  burden. Upon hospital discharge, recommend STR to maximize pt safety and return to PLOF.     Follow Up Recommendations  SNF    Equipment Recommendations   (TBD )    Recommendations for Other Services       Precautions / Restrictions Precautions Precautions: Fall Restrictions Weight Bearing Restrictions: No      Mobility Bed Mobility Overal bed mobility: Needs Assistance Bed Mobility: Supine to Sit;Sit to Supine     Supine to sit: Supervision Sit to supine: Supervision   General bed mobility comments: high amplitude movements, SBA for safety   Transfers Overall transfer level: Needs assistance Equipment used: Rolling walker (2 wheeled) Transfers: Sit to/from Stand Sit to Stand: Min assist         General transfer comment: STS x2 trials, MIN A decreasing to CGA second attempt. MIN A + RW side steps at EOB     Balance Overall balance assessment: Needs assistance Sitting-balance support: Bilateral upper extremity supported;Feet supported Sitting balance-Leahy Scale: Fair     Standing balance support: Single extremity supported Standing balance-Leahy Scale: Poor Standing balance comment: MIN A maintain balance c unilateal support on RW. CGA c BUE support on RW                            ADL either performed or assessed with clinical judgement   ADL Overall ADL's : Needs assistance/impaired  General ADL Comments: SETUP + SBA don L sock seated EOB - pt demonstrated high amplitude movements and decreased safety awareness (head coming close to rail) - OT MAX A don R sock at EOB. MIN A + RW for ADL t/f - required BUE support on RW. Tolerated ~1 min c unilateral RUE support on walker, tolerated ~ 30 sec c unilateral LUE support on walker (pt reports increased R ankle pain c weight shifting)     Vision         Perception     Praxis      Pertinent Vitals/Pain Pain Assessment: Faces Faces Pain  Scale: Hurts little more Pain Location: R ankle, L low back  Pain Descriptors / Indicators: Burning;Shooting Pain Intervention(s): Limited activity within patient's tolerance;Repositioned     Hand Dominance     Extremity/Trunk Assessment Upper Extremity Assessment Upper Extremity Assessment: Overall WFL for tasks assessed   Lower Extremity Assessment Lower Extremity Assessment: Generalized weakness       Communication Communication Communication: No difficulties (Per fiancee, pt has had expressive difficulties that wax/wan)   Cognition Arousal/Alertness: Awake/alert Behavior During Therapy: Impulsive Overall Cognitive Status: Impaired/Different from baseline Area of Impairment: Memory;Following commands;Safety/judgement;Awareness;Problem solving                     Memory: Decreased recall of precautions;Decreased short-term memory Following Commands: Follows one step commands consistently;Follows one step commands with increased time Safety/Judgement: Decreased awareness of safety;Decreased awareness of deficits   Problem Solving: Slow processing;Requires verbal cues;Requires tactile cues General Comments: Per fiancee pt waxes and wanes c cognition and has been seeing things (ex. monkey on helicopter). Pt was impulsive t/o session but easily redirected, pleasant, and eager to engage in activity   General Comments  Seated following initial standing trial HR 118, SpO2 98% on RA. Seated following 2nd standing trial: HR 130, SpO2 99% on RA - resolved c rest    Exercises Exercises: Other exercises Other Exercises Other Exercises: Pt and family educated re: the role of OT in recovery, stroke education, falls prevention, DME recs, d/c recs, importance of mobility for functional strengthening Other Exercises: LBD, UBD, sup<>sit, sit<>stand x2, sitting/standing balance/tolerance, functional reach inside BOS, marches, side step at EOB   Shoulder Instructions      Home  Living Family/patient expects to be discharged to:: Unsure Living Arrangements: Spouse/significant other;Children;Non-relatives/Friends (fiancee, son, friend) Available Help at Discharge: Family (fiancee 24/7 if needed) Type of Home: House Home Access: Stairs to enter Secretary/administrator of Steps: 3 Entrance Stairs-Rails: Right Home Layout: One level     Bathroom Shower/Tub: Runner, broadcasting/film/video: Shower seat - built in          Prior Functioning/Environment Level of Independence: Independent        Comments: independent in ADL/IADL without AD, though wife reports that recently he is always leaning on walls/funiture/etc. 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 Pt does not currently work. Per chart, pt drinks two 12-packs a day. Apparently he participated in a rehab group before, but did not like it because it was "too many people." He has had 5-6 falls in the past year, reports they occurred while he was drinking.        OT Problem List: Decreased strength;Decreased range of motion;Decreased activity tolerance;Impaired balance (sitting and/or standing);Decreased safety awareness;Decreased knowledge of use of DME or AE  OT Treatment/Interventions: Self-care/ADL training;Therapeutic exercise;Therapeutic activities;Patient/family education;Balance training;Energy conservation;Neuromuscular education    OT Goals(Current goals can be found in the care plan section) Acute Rehab OT Goals Patient Stated Goal: To get better and go home OT Goal Formulation: With patient/family Time For Goal Achievement: 06/11/20 Potential to Achieve Goals: Good ADL Goals Pt Will Perform Grooming: with set-up;sitting Pt Will Perform Lower Body Dressing: with supervision;sitting/lateral leans Pt Will Transfer to Toilet: ambulating;bedside commode;with min guard assist (c LRAD PRN) Additional ADL Goal #1: Pt will  participate in identifying >2 strategies for engaging in meaningful activity alternative to drinking to maximize pt quality of life and safety  OT Frequency: Min 2X/week   Barriers to D/C: Inaccessible home environment          Co-evaluation              AM-PAC OT "6 Clicks" Daily Activity     Outcome Measure Help from another person eating meals?: A Little Help from another person taking care of personal grooming?: A Little Help from another person toileting, which includes using toliet, bedpan, or urinal?: A Lot Help from another person bathing (including washing, rinsing, drying)?: A Lot Help from another person to put on and taking off regular upper body clothing?: A Little Help from another person to put on and taking off regular lower body clothing?: A Little 6 Click Score: 16   End of Session Equipment Utilized During Treatment: Gait belt;Rolling walker Nurse Communication: Mobility status  Activity Tolerance: Patient limited by pain Patient left: in bed;with call bell/phone within reach;with bed alarm set;with family/visitor present  OT Visit Diagnosis: Unsteadiness on feet (R26.81);Other abnormalities of gait and mobility (R26.89);History of falling (Z91.81);Muscle weakness (generalized) (M62.81)                Time: 1540-1611 OT Time Calculation (min): 31 min Charges:  OT General Charges $OT Visit: 1 Visit OT Evaluation $OT Eval Moderate Complexity: 1 Mod OT Treatments $Self Care/Home Management : 8-22 mins $Therapeutic Activity: 8-22 mins  Kathie Dike, M.S. OTR/L  05/28/20, 5:19 PM

## 2020-05-28 NOTE — Consult Note (Signed)
WOC Nurse Consult Note: Reason for Consult: Consult requested for bilat heels. WOC consult was previously performed on 7/15 and patient was noted to have scattered wounds; but areas have declined to heels and re consult was requested.   Wound type:  Right heel with dark purple deep tissue injury; 3X2X.1cm darer colored intact skin. Right plantar heel with previous blistered area which has opened and evolved into stage 2 pressure injury; red and dry, no odor or fluctuance.  Small amt pink drainage; loose peeling skin surrounding Left heel with previous blistered area which has opened and evolved into stage 3 pressure injury; 6X8X.2cm, red and dry, no odor or fluctuance.  Small amt pink drainage and erythemia and edema surrounding to 2 cm around wound Left calf with partial thickness dry scabbed wound, 3X1cm, dark red and dry, small amt red dranage Pressure Injury POA: Yes Dressing procedure/placement/frequency: Topical treatment orders have been provided for staff nurses to perform daily as follows to promote healing: Apply xeroform gauze to bilat heels and left calf wounds Q day, then cover with gauze and ace wraps.  Float heels to reduce pressure. Please re-consult if further assistance is needed.  Thank-you,  Cammie Mcgee MSN, RN, CWOCN, [place], CNS [phone]

## 2020-05-29 LAB — MAGNESIUM: Magnesium: 1.7 mg/dL (ref 1.7–2.4)

## 2020-05-29 MED ORDER — HALOPERIDOL LACTATE 5 MG/ML IJ SOLN
1.0000 mg | Freq: Once | INTRAMUSCULAR | Status: AC
  Administered 2020-05-29: 16:00:00 1 mg via INTRAVENOUS
  Filled 2020-05-29: qty 1

## 2020-05-29 MED ORDER — MAGNESIUM SULFATE 4 GM/100ML IV SOLN
4.0000 g | Freq: Once | INTRAVENOUS | Status: AC
  Administered 2020-05-29: 14:00:00 4 g via INTRAVENOUS
  Filled 2020-05-29: qty 100

## 2020-05-29 NOTE — TOC Progression Note (Signed)
Transition of Care (TOC) - Progression Note    Patient Details  Name: Shane Lewan Sr. MRN: [id number] Date of Birth: 01/13/1986  Transition of Care [facility] CM/SW Contact  Allayne Butcher, RN Phone Number: 05/29/2020, 3:22 PM  Clinical Narrative:    This RNCM verified with patient and significant other that he does not receive any SSI or disability.  Because the patient has no income he will be unable to go to SNF for rehab.  Patient prefers to go home anyway with home health.  Patient is not physically strong enough to even stand for more than 10 seconds at this time.  PT continues to follow and work with patient here in the hospital.  RNCM will attempt to find home health coverage.     Expected Discharge Plan: Home w Home Health Services Barriers to Discharge: Continued Medical Work up  Expected Discharge Plan and Services Expected Discharge Plan: Home w Home Health Services   Discharge Planning Services: CM Consult Post Acute Care Choice: Home Health                                         Social Determinants of Health (SDOH) Interventions    Readmission Risk Interventions No flowsheet data found.

## 2020-05-29 NOTE — Progress Notes (Signed)
PROGRESS NOTE    Shane Bones Sr.  OVF:[id number] DOB: 10-11-1986 DOA: 05/03/2020 PCP: Lorelee Market, MD   Brief Narrative:  34 year old gentleman with no medical history, chronic alcoholism, has been drinking nonstop about 12 packs of beer daily since age of 85 presented to the ED on  05/03/2020 for evaluation of progressive confusion and lethargy. He also had episode of unresponsiveness in the triage area. As per patient's fianc, patient stopped drinking about 24 hours prior to arrival and started behaving altered, confused. Also reported multiple episodes diarrhea, but no nausea or vomiting.  In ED he had 1 witnessed seizure. CT abdomen with concern of pancreatitis with elevated lipase, bilateral pleural effusions and ascites.  He was initially admitted for management of acute alcohol withdrawal and alcoholic pancreatitis.  Patient has a very complicated course of illness, underwent alcohol withdrawal.  There was some concern of SBP.  He was evaluated by surgeons on 05/06/2020 for concern of acute compartment syndrome and abdomen and found to have some intra-abdominal hypertension which was managed conservatively by placing Foley catheter and NG tube.  Paracentesis with cell count of 822, neutrophils of 79%.  Lipase was elevated at 53,000, LDH 526 and amylase of 10,000.  Cultures were negative.  Consistent with pancreatic ascites.  Repeat paracentesis was also consistent with pancreatic ascites and cultures remain negative.   Lipase continued to get worse initially with peaked at 1543, now trending down.  MRCP and repeat CT abdomens with some attenuations in pancreatic head and body, some improvement in edema.      Neurology was consulted on 05/10/2020 due to worsening altered mental status. 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  Patient did not receive any coronavirus [employer] vaccine.  He was initiated on  heparin.  EEG showed generalized background slowing.  No epileptiform activity was noted. A repeat MRI brain without contrast done on 05/15/2020 showed Residual abnormal diffusion restriction over the right posterior parietal lobe and right cerebellum, consistent with expected evolution of subacute venous infarcts.  It was started on Heparin by neurology, later transitioned to Xarelto.  Will need a 41-monthof anticoagulation.  On 05/16/2019 1 in the evening he became hypotensive and hypothermic with worsening leukocytosis and development of lactic acidosis.  He was transferred to ICU on 05/16/2020 due to the concern for developing septic shock.  He was started on pressors.  His lactate was 2 1 pro-Cal was 1.84 WBC was 40.4 and hemoglobin was 11.9.  CT abdomen done on 05/16/2020 showed peripancreatic edema with multiple pseudocyst.  The dominant pseudocyst has decreased to 2.2 cm.  There was extensive ascites with loculation around the liver right gutter and pelvis. Patient has been started on meropenem.  Although urine, blood and ascitic fluid cultures remain negative.  C. difficile colitis and GI pathogens were negative.  He completed a 9-day course of meropenem due to concern of infected pseudocyst.  Patient did developed decreased in hemoglobin and there was some concern of GI bleed.  Patient and his wife did endorse some blood in his stool before coming to the hospital.  Patient has a strong family history of colon cancer where both of his parents died of colon cancer, mother in [id number] father in 613s  GI is recommending outpatient work-up after this acute illness.   TPN was started on 05/16/2020 due to poor p.o. intake and stopped on 05/24/2020.  05/24/20.  Patient become altered  with confabulation-suspecting Wernicke's encephalopathy and he was started on high-dose thiamine.  MRI without any acute change.  Since then he remained stable, continue to improve and transferred to [place] on  05/26/2020.  Subjective: Patient has no new complaint today.  He was resting comfortably in bed when seen today.  We discussed improving participation with therapy and oral nutrition.  He seems motivated.  Assessment & Plan:   Principal Problem:   Acute alcoholic pancreatitis Active Problems:   Alcohol withdrawal seizure with delirium (HCC)   Hyponatremia   Hypokalemia   AKI (acute kidney injury) ([place])   Elevated CK   Heme positive stool   Thrombocytopenia (HCC)   Acute metabolic encephalopathy   Severe malnutrition (HCC)   Sinus tachycardia   Ascites   Hypotension   Protein-calorie malnutrition, severe   Pressure injury of skin  Acute encephalopathy.  Resolved   Patient developed acute encephalopathy by pulling all his lines and trying to get out of bed.  he was confabulating.  Concern of Wernicke's  encephalopathy.  Patient was getting some thiamine with TPN. MRI without any new change. -He was started on high-dose thiamine 500 mg 3 times a day for 2 days followed by 250 mg daily for another 3 days.  Day 5 today.  This can be stopped after last dose today.  Sepsis with septic shock.  Resolved  Transferred to ICU on 05/15/2020 due to hypothermia and hypotension requiring Levophed.  Now off the Levophed and maintaining his blood pressure.  ID was also consulted and they started him on meropenem for concern of infected pseudocyst. Patient afebrile with improvement in leukocytosis. Completed the course of meropenem.  Blood transfusion reaction.  Resolved  Patient did develop some rash and become short of breath with some pulmonary vascular congestion yesterday while getting blood transfusion. -Tolerated another transfusion after that without any problem.. Total of 3.5 units of PRBC given during current hospitalization.  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   CT venogram on 7/3 and a repeat on 7/5 with  persistent thrombosis at the confluence of sinuses and left transverse dural venous sinus.  Neurology is following and they start him on heparin infusion.  Patient also had acute right cerebellum and a possible old infarcts involving parietal lobes and left occipital region, seen on MRI done on 05/12/2020.  Due to his worsening lower extremity rash which is also involving some upper extremities there is some concern of vasculitis.  Patient also has some rash/thrush in his mouth. His differential is broad at this time which includes rheumatologic disorder/vasculitis/Behcet's syndrome/malignancy.  He underwent an extensive rheumatologic and hypercoagulable work-up. Elevated ESR and CRP.  CK has been normalized. Dr. Jefm Bryant from rheumatology saw the patient and agrees about his confusing picture as he is not fitting on any definite thing.  His rash and CVT can be explained with Behcet's syndrome but that is a clinical diagnosis.  No oral or genital ulceration.  No vision or eye changes.  Vasculitis or panniculitis can be another possibility.  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 -Patient was started on Solu-Medrol 80 mg daily after the skin biopsy, he received 3 days of Solu-Medrol, was discontinued once vasculitis ruled out. -RPR and hepatitis panel negative. -ANCA titers negative. -Cardiolipin antibodies negative. -Antithrombin III activity reduced at 59 with reduced AC [place] at 30.  Which is more consistent with increased consumption as patient has active thrombosis.  He will  need a repeat testing after acute illness and needs a follow-up with hematologist as an outpatient. -Protein S levels within normal limit. -CA 19-9 mildly elevated at 81-which is very nonspecific. -Lupus anticoagulant panel with PTT lupus anticoagulant within normal limit and DRV VT mildly elevated at 51.2, which can occur by a deficiency of one of the common path risk factor  include 2, 5, 10 or fibrinogen.  Will need a repeat testing after acute illness. -Prothrombin gene mutation negative -Vitamin D level reduced at 7.39-we started him on repletion. -Folic acid was reduced-started him on supplement. -B1 within normal limit-Getting high-dose thiamine. -Protein C levels were low at 56. - factor V Leiden.  No mutation. -Switch Lovenox with Xarelto-there are some studies which shows favorable outcome in patients with CVT with Xarelto.  More studies with Coumadin but patient might not stay compliant.  He will need 6 months of anticoagulation. -Still need outpatient oncology work-up as there is no other obvious cause found for CVT. -Patient also need repeat protein C levels once recovered from this acute illness.  Erythematous skin rash/pancreatic panniculitis.  Seems improving Vasculitis ruled out. -Skin biopsy was done by dermatology on 05/23/20-consistent with pancreatic panniculitis-enzymatic fat necrosis secondary to severe pancreatitis.  Lower extremity edema.  Improving  Multifactorial as he received a lot of fluid along with lower albumin.  He did receive some doses of IV Lasix which resulted in improvement of edema. -Continue albumin. -Received IV Lasix in past to help with swelling around ankles.  Thrombocytopenia.  Resolved .  HIT antibodies are negative.  Anemia.  Hemoglobin improving, 9.7 and stable.  Received total of 3.5 units of PRBC.   Per wife he did had some bleeding per rectum at home.  Not aware of any obvious bleeding while in the hospital.  Patient is critically ill also.  Had some mild hematuria on UA done on 05/16/2020.  No gross hematuria.  CT abdomen without any retroperitoneal bleed and repeat paracentesis on 05/23/2020 was without any blood. -Check anemia panel-iron studies consistent with anemia of chronic disease.  Low folic acid, normal V89. -Received IV folic acid for 5 days-we will discontinue IV folic acid and continue with p.o.  supplement. -Continue to monitor and transfuse if below 7.  Chronic pancreatitis with pseudocyst formation.  Present on admission.  CT abdomen without any gallbladder abnormalities but did had multiple cyst in pancreatic head and body.  Lipase peaked at 1543, now trending down. Current lipase of 84. -CT abdomen was repeated 7/13 due to concern of retroperitoneal bleeding.  It shows stable pancreatitis changes with pseudocyst.  Stable ascites and pleural effusion. -Continue supportive care.  Severe protein calorie malnutrition.  POA, most likely secondary to chronic alcohol abuse.   BMI of 22.27 TPN from 05/16/2020 till 05/24/2020. -Dietitian has put their recommendations with improving p.o. intake. -Continue monitoring daily CMP, magnesium, phosphorous. -Encouraged p.o. intake.  He seems motivated.  Nutritional deficiencies.  Secondary to alcohol abuse. Patient so far found to have deficiencies of vitamin A, vitamin C, vitamin D and copper.  Zinc is within lower normal limit.  Thiamine within normal limit. -Continue supplement.  Transaminitis.  Resolved. He was on TPN and also has an history of alcohol abuse. -Continue to monitor.  TSH elevation.  T4 normal.  No acute concern at this time.  Diarrhea.  Resolved.  Sinus tachycardia.  Controlled with metoprolol to 50 mg twice daily and monitor.  Pancreatic ascites.  Paracentesis labs are consistent with pancreatic ascites.  CT  abdomen with mild fatty infiltrate, no cirrhosis.  Patient underwent paracentesis twice 1 on 05/07/2020 and second on 05/16/2020.  Culture remain negative.  Repeat CT abdomen on 7/13 with stable ascites. -Repeat paracentesis -on 7/14, again consistent with pancreatic ascites with negative cultures.  Pressure injuries.  High risk due to severe protein calorie malnutrition and inability to move.  Getting some unstageable breakdown on his sacrum and bilateral heels. -Wound care consult-appreciate their  recommendations. -Continue to monitor. -Frequent position changes.  AKI.  Resolved Patient had AKI on admission.  Most likely prerenal as creatinine normalized with IV hydration. -Continue to monitor.  Electrolyte abnormalities.  Has multiple electrolyte abnormalities during the course of illness which include hypokalemia, hypomagnesemia and hyponatremia which has been resolved. -Continue to monitor BMP, magnesium and phosphate.  Depression.  He seems more motivated.  Mood has improved. per wife he is feeling very down since the death of his father and drinking a lot in order to overcome his depression. -Psychiatry started him on Zyprexa and Cymbalta on 05/25/2020.  Palliative care consult.  Palliative care was consulted to discuss goals of care.  Patient and his wife will continue aggressive measures and to treat treatables.  Code was changed to DNR. Palliative signed off -as we decided to pursue aggressive measures.  Physical deconditioning.  And has severe physical deconditioning secondary to acute illness. PT are recommending SNF placement.  Placement will be hard with his insurance.  We will try getting him maximum home health services and keep him for next couple of days to get a physical therapy so he can transfer himself from bed to chair and bedside commode himself.  Patient is critically ill with multiorgan involvement and is very high risk for deterioration and death.  Seems improving now.   Objective: Vitals:   05/29/20 0351 05/29/20 0500 05/29/20 0759 05/29/20 1200  BP: (!) 135/97  (!) 157/91 115/83  Pulse: 94  81 78  Resp: '18  16 17  ' Temp: 97.8 F (36.6 C)  98.1 F (36.7 C) 98.3 F (36.8 C)  TempSrc: Oral     SpO2: 95%  100% 98%  Weight:  62.2 kg    Height:        Intake/Output Summary (Last 24 hours) at 05/29/2020 1302 Last data filed at 05/29/2020 0950 Gross per 24 hour  Intake 240 ml  Output 1000 ml  Net -760 ml   Filed Weights   05/26/20 1351 05/28/20  0408 05/29/20 0500  Weight: 64.5 kg 64.9 kg 62.2 kg    Examination:  General.  Chronically ill-appearing gentleman,   in no acute distress. Pulmonary.  Lungs clear bilaterally, normal respiratory effort. CV.  Regular rate and rhythm, no JVD, rub or murmur. Abdomen.  Soft, nontender, nondistended, BS positive. CNS.  Alert and oriented x3.  No focal neurologic deficit. Extremities.  Trace LE edema, no cyanosis, pulses intact and symmetrical. Psychiatry.  Judgment and insight appears normal.  DVT prophylaxis: Xarelto Code Status: DNR Family Communication: No family at bedside today.  Discussed with patient. Disposition Plan:  Status is: Inpatient  Remains inpatient appropriate because:Inpatient level of care appropriate due to severity of illness   Dispo: The patient is from: Home              Anticipated d/c is to: To be determined              Anticipated d/c date is: 2 days  Patient currently is not medically stable to d/c.  Patient seems improving.  PT recommended SNF placement.  Difficult placement due to insurance.  We will keep for next couple of days to work with physical therapy while in the hospital and then discharged home with home health services.   Consultants:   Neurology  Gastroenterology  General surgery  Palliative care  PCCM  Rheumatology  Dermatology  Procedures:  Antimicrobials:   Data Reviewed: I have personally reviewed following labs and imaging studies  CBC: Recent Labs  Lab 05/24/20 0409 05/25/20 0312 05/26/20 0514 05/27/20 0650 05/28/20 0416  WBC 16.9* 10.3 8.5 9.2 9.9  HGB 7.9* 8.5* 9.4* 9.4* 9.7*  HCT 23.9* 25.7* 28.8* 27.6* 28.6*  MCV 98.0 92.4 94.7 92.3 92.0  PLT 130* 178 226 270 161   Basic Metabolic Panel: Recent Labs  Lab 05/23/20 0458 05/23/20 0458 05/24/20 0409 05/24/20 0409 05/24/20 0550 05/25/20 0312 05/26/20 0514 05/27/20 0650 05/28/20 0416 05/29/20 0425  NA 134*   < > 128*   < > 132* 134* 135  135 134*  --   K 4.5   < > 5.9*   < > 4.3 3.9 3.9 3.7 3.8  --   CL 95*   < > 93*   < > 94* 97* 95* 98 101  --   CO2 27   < > 27   < > '30 28 28 26 28  ' --   GLUCOSE 131*   < > 939*   < > 174* 86 93 98 108*  --   BUN 19   < > 18   < > '18 19 16 10 12  ' --   CREATININE 0.52*   < > 0.51*   < > 0.50* 0.60* 0.63 0.51* 0.49*  --   CALCIUM 8.3*   < > 8.3*   < > 8.2* 8.3* 8.8* 8.6* 8.6*  --   MG 1.7   < > 2.7*   < >  --  1.6* 1.9 1.6* 1.9 1.7  PHOS 4.6  --  4.5  --   --  5.0*  --  5.4*  --   --    < > = values in this interval not displayed.   GFR: Estimated Creatinine Clearance: 114.5 mL/min (A) (by C-G formula based on SCr of 0.49 mg/dL (L)). Liver Function Tests: Recent Labs  Lab 05/24/20 0409 05/25/20 0312 05/26/20 0514 05/27/20 0650 05/28/20 0416  AST 80* 89* 54* 31 26  ALT 83* 112* 93* 65* 52*  ALKPHOS 137* 138* 123 104 106  BILITOT 0.5 0.6 0.4 0.8 0.6  PROT 4.7* 5.1* 5.4* 5.4* 5.8*  ALBUMIN 1.5* 1.7* 2.1* 2.2* 2.5*   Recent Labs  Lab 05/26/20 0514  LIPASE 84*   Recent Labs  Lab 05/23/20 1532  AMMONIA 12   Coagulation Profile: No results for input(s): INR, PROTIME in the last 168 hours. Cardiac Enzymes: No results for input(s): CKTOTAL, CKMB, CKMBINDEX, TROPONINI in the last 168 hours. BNP (last 3 results) No results for input(s): PROBNP in the last 8760 hours. HbA1C: No results for input(s): HGBA1C in the last 72 hours. CBG: Recent Labs  Lab 05/25/20 0021 05/25/20 0532 05/26/20 0112 05/26/20 0542 05/26/20 1119  GLUCAP 122* 84 95 73 89   Lipid Profile: No results for input(s): CHOL, HDL, LDLCALC, TRIG, CHOLHDL, LDLDIRECT in the last 72 hours. Thyroid Function Tests: No results for input(s): TSH, T4TOTAL, FREET4, T3FREE, THYROIDAB in the last 72 hours. Anemia Panel: No results for  input(s): VITAMINB12, FOLATE, FERRITIN, TIBC, IRON, RETICCTPCT in the last 72 hours. Sepsis Labs: Recent Labs  Lab 05/23/20 0458 05/23/20 1515  PROCALCITON 1.34  --    LATICACIDVEN  --  1.0    Recent Results (from the past 240 hour(s))  Aerobic Culture (superficial specimen)     Status: None   Collection Time: 05/21/20  2:30 PM   Specimen: Wound  Result Value Ref Range Status   Specimen Description   Final    WOUND Performed at [facility], [address], [place], [place] [address]    Special Requests   Final    NONE Performed at [facility], [address] [place], [place] [address]    Gram Stain NO WBC SEEN NO ORGANISMS SEEN   Final   Culture   Final    NO GROWTH 2 DAYS Performed at [facility], [place] [address], [place], [place] [address]    Report Status 05/25/2020 FINAL  Final  Peritoneal Fluid culture ( includes gram stain)     Status: None   Collection Time: 05/23/20 11:41 AM   Specimen: Peritoneal Washings; Peritoneal Fluid  Result Value Ref Range Status   Specimen Description   Final    PERITONEAL Performed at [facility], [address] [place], [place] [address]    Special Requests   Final    Normal Performed at [facility], [address] [place], [place] [address]    Gram Stain   Final    WBC PRESENT,BOTH PMN AND MONONUCLEAR NO ORGANISMS SEEN CYTOSPIN SMEAR    Culture   Final    NO GROWTH 3 DAYS Performed at [facility], [place] [address], [place], [place] [address]    Report Status 05/27/2020 FINAL  Final  Body fluid culture     Status: None   Collection Time: 05/23/20  1:29 PM   Specimen: PATH Cytology Peritoneal fluid  Result Value Ref Range Status   Specimen Description   Final    PERITONEAL Performed at [facility], [address], [place], [place] [address]    Special Requests   Final    NONE Performed at [facility], [address] [place], [place] [address]    Gram Stain   Final    WBC PRESENT,BOTH PMN AND MONONUCLEAR NO ORGANISMS SEEN CYTOSPIN SMEAR    Culture   Final    NO GROWTH 3 DAYS Performed at [facility], [place] [address], [place], [place] [address]    Report Status 05/27/2020 FINAL  Final  Fungus Culture With Stain     Status: None (Preliminary result)   Collection Time: 05/23/20  1:29 PM   Specimen: PATH Cytology Peritoneal fluid  Result Value Ref Range Status   Fungus Stain Final report  Final    Comment: (NOTE) Performed At: [facility] [place], [place] [id number] Rush Farmer MD UT:[id number]    Fungus (Mycology) Culture PENDING  Incomplete   Fungal Source PERITONEAL  Final    Comment: Performed at [facility], [address] [place], [place] [address]  Aerobic Culture (superficial specimen)     Status: None   Collection Time: 05/23/20  1:29 PM   Specimen: PATH Cytology Peritoneal fluid  Result Value Ref Range Status   Specimen Description   Final    PERITONEAL Performed at [facility], [address] [place], [place] [address]    Special Requests  Final    NONE Performed at [facility], [address] [place], [place] [address]    Gram Stain   Final    RARE WBC PRESENT, PREDOMINANTLY MONONUCLEAR NO ORGANISMS SEEN    Culture   Final    NO GROWTH 3 DAYS Performed at [facility], [place] [address], [place], [place] [address]    Report Status 05/26/2020 FINAL  Final  Fungus Culture Result     Status: None   Collection Time: 05/23/20  1:29 PM  Result Value Ref Range Status   Result 1 Comment  Final    Comment: (NOTE) KOH/Calcofluor preparation:  no fungus observed. Performed At: [facility] [place], [place] [id number] Rush Farmer MD BD:[id number]      Radiology Studies: No results found.  Scheduled Meds: . sodium chloride   Intravenous Once  . vitamin C  500 mg Oral BID  . Chlorhexidine Gluconate Cloth  6 each Topical Daily  . dronabinol  2.5 mg Oral BID AC  . DULoxetine  20 mg Oral Daily  . feeding supplement (ENSURE ENLIVE)  237 mL Oral TID BM  . ferrous  [id number] C-folic acid  1 capsule Oral BID PC  . Gerhardt's butt cream   Topical TID  . metoprolol tartrate  50 mg Oral BID  . multivitamin with minerals  1 tablet Oral Daily  . multivitamin-lutein  1 capsule Oral Daily  . OLANZapine zydis  5 mg Oral QHS  . pantoprazole  40 mg Oral Daily  . rivaroxaban  20 mg Oral Q supper  . sodium chloride flush  3 mL Intravenous Q12H  . vitamin A  10,000 Units Oral Daily  . Vitamin D (Ergocalciferol)  50,000 Units Oral Q7 days  . zinc sulfate  220 mg Oral Daily   Continuous Infusions: . sodium chloride Stopped (05/13/20 1628)  . albumin human 12.5 g (05/29/20 0912)  . magnesium sulfate bolus IVPB       LOS: 26 days   Time spent: 40 minutes.  More than 50% of that time was spent in direct patient care and discussing the plan with nursing staff and wife.  Max Sane, MD [facility]  If 7PM-7AM, please contact night-coverage [email or web address]  05/29/2020, 1:02 PM   This record has been created using Systems analyst. Errors have been sought and corrected,but may not always be located. Such creation errors do not reflect on the standard of care.

## 2020-05-29 NOTE — Progress Notes (Signed)
Physical Therapy Treatment Patient Details Name: Shane Eleazer Sr. MRN: [id number] DOB: February 13, 1986 Today's Date: 05/29/2020    History of Present Illness 34 y/o male who has a long history of alcohol abuse who presented to the ED (6/24) in a state of withdrawal and was witnessed to have a seizure; no seizure activity reported since. He was found to have acute alcoholic pancreatitis. Pt has been drinking since he was 18 and per chart, drinks two 12-packs a day. 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 05/15/2020 showed Residual abnormal diffusion restriction over the right posterior parietal lobe and right cerebellum, consistent with expected evolution of subacute venous infarcts.    PT Comments    Patient eager to participate with therapy. Patient is impulsive with activity and needs frequent cues for safety with mobility. Limited awareness of physical limitations and need for assistance with mobility for safety and fall prevention. Three bouts of standing performed at bedside with Mod A- Max A and cues for technique. Patient short of breath with activity and with standing tolerance of less than 30 seconds with each standing bout. Heart rate noted to be up to 120bpm with activity and decreased with seated resting. Sp02 99% on room air. Patient participated with seated and supine therapeutic exercise however is unable to complete independently due to weakness and need for cues for technique. Patient is very motivated to participate with therapy today. Given deconditioning, weakness, and assistance required for mobility, recommend further therapy at discharge.    Follow Up Recommendations  SNF;Supervision/Assistance - 24 hour     Equipment Recommendations   (to be determined based on progress )    Recommendations for Other Services       Precautions / Restrictions Precautions Precautions: Fall Precaution Comments: very  impulsive at times  Restrictions Weight Bearing Restrictions: No    Mobility  Bed Mobility Overal bed mobility: Needs Assistance Bed Mobility: Supine to Sit;Sit to Supine     Supine to sit: Supervision Sit to supine: Min assist   General bed mobility comments: assistance for LE support to return to bed. patient needs cues for safety and is very impulsive at times with activity   Transfers Overall transfer level: Needs assistance   Transfers: Sit to/from Stand Sit to Stand: Mod assist;Max assist         General transfer comment: 3 bout of standing performed at edge of bed. Mod A with first bout of standing and Max A after due to fatigue with activity. lifting and lowering assistance provided. verbal cues for hand placement, sequencing, and technique for standing   Ambulation/Gait             General Gait Details: unsafe to attempt ambulation due to poor standing tolerance (up to 30 seconds).    Stairs             Wheelchair Mobility    Modified Rankin (Stroke Patients Only)       Balance Overall balance assessment: Needs assistance Sitting-balance support: Bilateral upper extremity supported;Feet supported Sitting balance-Leahy Scale: Fair     Standing balance support: Single extremity supported Standing balance-Leahy Scale: Poor Standing balance comment: Min A- Mod A for standing balance. posterior lean                             Cognition Arousal/Alertness: Awake/alert Behavior During Therapy: Impulsive Overall Cognitive Status: Impaired/Different from  baseline Area of Impairment: Safety/judgement;Following commands;Memory;Awareness                     Memory: Decreased recall of precautions;Decreased short-term memory Following Commands: Follows one step commands consistently;Follows multi-step commands inconsistently;Follows one step commands with increased time   Awareness:  (awareness of physical limitations ) Problem  Solving: Slow processing        Exercises General Exercises - Lower Extremity Ankle Circles/Pumps: AAROM;Strengthening;Both;15 reps;Seated Long Arc Quad: AAROM;Strengthening;Both;15 reps;Seated Heel Slides: AAROM;Strengthening;Both;15 reps;Supine Hip ABduction/ADduction: AAROM;Strengthening;Both;15 reps;Supine Straight Leg Raises: AAROM;Strengthening;Both;10 reps;Supine Other Exercises Other Exercises: verbal and tactile cues for exercise technique. patient required rest break after exercise due to fatigue with activity. shortness of breath noted with exercise. Sp02 99% on room air     General Comments        Pertinent Vitals/Pain Pain Assessment: No/denies pain    Home Living                      Prior Function            PT Goals (current goals can now be found in the care plan section) Acute Rehab PT Goals Patient Stated Goal: to walk again  PT Goal Formulation: With patient Time For Goal Achievement: 06/07/20 Potential to Achieve Goals: Fair Progress towards PT goals: Progressing toward goals    Frequency    Min 2X/week      PT Plan Current plan remains appropriate    Co-evaluation              AM-PAC PT "6 Clicks" Mobility   Outcome Measure  Help needed turning from your back to your side while in a flat bed without using bedrails?: A Little Help needed moving from lying on your back to sitting on the side of a flat bed without using bedrails?: A Lot Help needed moving to and from a bed to a chair (including a wheelchair)?: A Lot Help needed standing up from a chair using your arms (e.g., wheelchair or bedside chair)?: A Lot Help needed to walk in hospital room?: Total Help needed climbing 3-5 steps with a railing? : Total 6 Click Score: 11    End of Session Equipment Utilized During Treatment: Gait belt Activity Tolerance: Patient limited by fatigue Patient left: in bed;with call bell/phone within reach;with bed alarm set (telesitter  in the room ) Nurse Communication: Mobility status;Precautions PT Visit Diagnosis: Muscle weakness (generalized) (M62.81);Difficulty in walking, not elsewhere classified (R26.2);Unsteadiness on feet (R26.81)     Time: 1125-1203 PT Time Calculation (min) (ACUTE ONLY): 38 min  Charges:  $Therapeutic Exercise: 8-22 mins $Therapeutic Activity: 23-37 mins                     Donna Bernard, PT, MPT    Shane Jenkins 05/29/2020, 12:47 PM

## 2020-05-30 LAB — MAGNESIUM: Magnesium: 1.9 mg/dL (ref 1.7–2.4)

## 2020-05-30 NOTE — Progress Notes (Signed)
PROGRESS NOTE    Shane Haueter Sr.  ZOX:[id number] DOB: 08/11/1986 DOA: 05/03/2020 PCP: Lorelee Market, MD   Brief Narrative:  34 year old gentleman with no medical history, chronic alcoholism, has been drinking nonstop about 12 packs of beer daily since age of 40 presented to the ED on  05/03/2020 for evaluation of progressive confusion and lethargy. He also had episode of unresponsiveness in the triage area. As per patient's fianc, patient stopped drinking about 24 hours prior to arrival and started behaving altered, confused. Also reported multiple episodes diarrhea, but no nausea or vomiting.  In ED he had 1 witnessed seizure. CT abdomen with concern of pancreatitis with elevated lipase, bilateral pleural effusions and ascites.  He was initially admitted for management of acute alcohol withdrawal and alcoholic pancreatitis.  Patient has a very complicated course of illness, underwent alcohol withdrawal.  There was some concern of SBP.  He was evaluated by surgeons on 05/06/2020 for concern of acute compartment syndrome and abdomen and found to have some intra-abdominal hypertension which was managed conservatively by placing Foley catheter and NG tube.  Paracentesis with cell count of 822, neutrophils of 79%.  Lipase was elevated at 53,000, LDH 526 and amylase of 10,000.  Cultures were negative.  Consistent with pancreatic ascites.  Repeat paracentesis was also consistent with pancreatic ascites and cultures remain negative.   Lipase continued to get worse initially with peaked at 1543, now trending down.  MRCP and repeat CT abdomens with some attenuations in pancreatic head and body, some improvement in edema.      Neurology was consulted on 05/10/2020 due to worsening altered mental status. 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  Patient did not receive any coronavirus [employer] vaccine.  He was initiated on  heparin.  EEG showed generalized background slowing.  No epileptiform activity was noted. A repeat MRI brain without contrast done on 05/15/2020 showed Residual abnormal diffusion restriction over the right posterior parietal lobe and right cerebellum, consistent with expected evolution of subacute venous infarcts.  It was started on Heparin by neurology, later transitioned to Xarelto.  Will need a 78-monthof anticoagulation.  On 05/16/2019 1 in the evening he became hypotensive and hypothermic with worsening leukocytosis and development of lactic acidosis.  He was transferred to ICU on 05/16/2020 due to the concern for developing septic shock.  He was started on pressors.  His lactate was 2 1 pro-Cal was 1.84 WBC was 40.4 and hemoglobin was 11.9.  CT abdomen done on 05/16/2020 showed peripancreatic edema with multiple pseudocyst.  The dominant pseudocyst has decreased to 2.2 cm.  There was extensive ascites with loculation around the liver right gutter and pelvis. Patient has been started on meropenem.  Although urine, blood and ascitic fluid cultures remain negative.  C. difficile colitis and GI pathogens were negative.  He completed a 9-day course of meropenem due to concern of infected pseudocyst.  Patient did developed decreased in hemoglobin and there was some concern of GI bleed.  Patient and his wife did endorse some blood in his stool before coming to the hospital.  Patient has a strong family history of colon cancer where both of his parents died of colon cancer, mother in 446sand father in 644s  GI is recommending outpatient work-up after this acute illness.   TPN was started on 05/16/2020 due to poor p.o. intake and stopped on 05/24/2020.  05/24/20.  Patient become altered  with confabulation-suspecting Wernicke's encephalopathy and he was started on high-dose thiamine.  MRI without any acute change.  Since then he remained stable, continue to improve and transferred to [place] on  05/26/2020.  Subjective: Patient has no new complaint.  He was eating well.  Wife at bedside.  He was unable to stand for more than 20 seconds with PT yesterday.  Assessment & Plan:   Principal Problem:   Acute alcoholic pancreatitis Active Problems:   Alcohol withdrawal seizure with delirium (HCC)   Hyponatremia   Hypokalemia   AKI (acute kidney injury) ([place])   Elevated CK   Heme positive stool   Thrombocytopenia (HCC)   Acute metabolic encephalopathy   Severe malnutrition (HCC)   Sinus tachycardia   Ascites   Hypotension   Protein-calorie malnutrition, severe   Pressure injury of skin  Acute encephalopathy.  Resolved   Patient developed acute encephalopathy by pulling all his lines and trying to get out of bed.  he was confabulating.  Concern of Wernicke's  encephalopathy.  Patient was getting some thiamine with TPN. MRI without any new change. -He was started on high-dose thiamine 500 mg 3 times a day for 2 days followed by 250 mg daily for another 3 days.  Day 5 today.  This can be stopped after last dose today.  Sepsis with septic shock.  Resolved  Transferred to ICU on 05/15/2020 due to hypothermia and hypotension requiring Levophed.  Now off the Levophed and maintaining his blood pressure.  ID was also consulted and they started him on meropenem for concern of infected pseudocyst. Patient afebrile with improvement in leukocytosis. Completed the course of meropenem.  Blood transfusion reaction.  Resolved  Patient did develop some rash and become short of breath with some pulmonary vascular congestion yesterday while getting blood transfusion. -Tolerated another transfusion after that without any problem.. Total of 3.5 units of PRBC given during current hospitalization.  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   CT venogram on 7/3 and a repeat on 7/5 with persistent thrombosis at the confluence of  sinuses and left transverse dural venous sinus.  Neurology is following and they start him on heparin infusion.  Patient also had acute right cerebellum and a possible old infarcts involving parietal lobes and left occipital region, seen on MRI done on 05/12/2020.  Due to his worsening lower extremity rash which is also involving some upper extremities there is some concern of vasculitis.  Patient also has some rash/thrush in his mouth. His differential is broad at this time which includes rheumatologic disorder/vasculitis/Behcet's syndrome/malignancy.  He underwent an extensive rheumatologic and hypercoagulable work-up. Elevated ESR and CRP.  CK has been normalized. Dr. Jefm Bryant from rheumatology saw the patient and agrees about his confusing picture as he is not fitting on any definite thing.  His rash and CVT can be explained with Behcet's syndrome but that is a clinical diagnosis.  No oral or genital ulceration.  No vision or eye changes.  Vasculitis or panniculitis can be another possibility.  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 -Patient was started on Solu-Medrol 80 mg daily after the skin biopsy, he received 3 days of Solu-Medrol, was discontinued once vasculitis ruled out. -RPR and hepatitis panel negative. -ANCA titers negative. -Cardiolipin antibodies negative. -Antithrombin III activity reduced at 59 with reduced AC [place] at 30.  Which is more consistent with increased consumption as patient has active thrombosis.  He will need a  repeat testing after acute illness and needs a follow-up with hematologist as an outpatient. -Protein S levels within normal limit. -CA 19-9 mildly elevated at 81-which is very nonspecific. -Lupus anticoagulant panel with PTT lupus anticoagulant within normal limit and DRV VT mildly elevated at 51.2, which can occur by a deficiency of one of the common path risk factor include 2, 5, 10 or fibrinogen.  Will need a  repeat testing after acute illness. -Prothrombin gene mutation negative -Vitamin D level reduced at 7.39-we started him on repletion. -Folic acid was reduced-started him on supplement. -B1 within normal limit-Getting high-dose thiamine. -Protein C levels were low at 56. - factor V Leiden.  No mutation. -Switch Lovenox with Xarelto-there are some studies which shows favorable outcome in patients with CVT with Xarelto.  More studies with Coumadin but patient might not stay compliant.  He will need 6 months of anticoagulation. -Still need outpatient oncology work-up as there is no other obvious cause found for CVT. -Patient also need repeat protein C levels once recovered from this acute illness.  Erythematous skin rash/pancreatic panniculitis.  Seems improving Vasculitis ruled out. -Skin biopsy was done by dermatology on 05/23/20-consistent with pancreatic panniculitis-enzymatic fat necrosis secondary to severe pancreatitis.  Lower extremity edema.  Improving  Multifactorial as he received a lot of fluid along with lower albumin.  He did receive some doses of IV Lasix which resulted in improvement of edema. Patient was also getting IV albumin daily. -Discontinue IV albumin  Thrombocytopenia.  Resolved .  HIT antibodies are negative.  Anemia.  Hemoglobin improving, 9.7 and stable.  Received total of 3.5 units of PRBC.   Per wife he did had some bleeding per rectum at home.  Not aware of any obvious bleeding while in the hospital.  Patient is critically ill also.  Had some mild hematuria on UA done on 05/16/2020.  No gross hematuria.  CT abdomen without any retroperitoneal bleed and repeat paracentesis on 05/23/2020 was without any blood. -Check anemia panel-iron studies consistent with anemia of chronic disease.  Low folic acid, normal L37. -Received IV folic acid for 5 days-we will discontinue IV folic acid and continue with p.o. supplement. -Continue to monitor and transfuse if below  7.  Chronic pancreatitis with pseudocyst formation.  Present on admission.  CT abdomen without any gallbladder abnormalities but did had multiple cyst in pancreatic head and body.  Lipase peaked at 1543, now trending down. Current lipase of 84. -CT abdomen was repeated 7/13 due to concern of retroperitoneal bleeding.  It shows stable pancreatitis changes with pseudocyst.  Stable ascites and pleural effusion. -Continue supportive care.  Severe protein calorie malnutrition.  POA, most likely secondary to chronic alcohol abuse.   BMI of 22.27 TPN from 05/16/2020 till 05/24/2020. -Dietitian has put their recommendations with improving p.o. intake. -Continue monitoring daily CMP, magnesium, phosphorous. -Encouraged p.o. intake.  He seems motivated and improving.  Nutritional deficiencies.  Secondary to alcohol abuse. Patient so far found to have deficiencies of vitamin A, vitamin C, vitamin D and copper.  Zinc is within lower normal limit.  Thiamine within normal limit. -Continue supplement.  Transaminitis.  Resolved. He was on TPN and also has an history of alcohol abuse. -Continue to monitor.  TSH elevation.  T4 normal.  No acute concern at this time.  Diarrhea.  Resolved.  Sinus tachycardia.  Controlled with metoprolol to 50 mg twice daily and monitor.  Pancreatic ascites.  Paracentesis labs are consistent with pancreatic ascites.  CT abdomen with mild  fatty infiltrate, no cirrhosis.  Patient underwent paracentesis twice 1 on 05/07/2020 and second on 05/16/2020.  Culture remain negative.  Repeat CT abdomen on 7/13 with stable ascites. -Repeat paracentesis -on 7/14, again consistent with pancreatic ascites with negative cultures.  Pressure injuries.  High risk due to severe protein calorie malnutrition and inability to move.  Getting some unstageable breakdown on his sacrum and bilateral heels. -Wound care consult-appreciate their recommendations. -Continue to monitor. -Frequent position  changes.  AKI.  Resolved Patient had AKI on admission.  Most likely prerenal as creatinine normalized with IV hydration. -Continue to monitor.  Electrolyte abnormalities.  Has multiple electrolyte abnormalities during the course of illness which include hypokalemia, hypomagnesemia and hyponatremia which has been resolved. -Continue to monitor BMP, magnesium and phosphate.  Depression.  He seems more motivated.  Mood has improved. per wife he is feeling very down since the death of his father and drinking a lot in order to overcome his depression. -Psychiatry started him on Zyprexa and Cymbalta on 05/25/2020.  Palliative care consult.  Palliative care was consulted to discuss goals of care.  Patient and his wife will continue aggressive measures and to treat treatables.  Code was changed to DNR. Palliative signed off -as we decided to pursue aggressive measures.  Physical deconditioning.  And has severe physical deconditioning secondary to acute illness. PT are recommending SNF placement.  Placement will be hard with his insurance.  We will try getting him maximum home health services and keep him for next couple of days to get a physical therapy so he can transfer himself from bed to chair and bedside commode himself.  Patient is critically ill with multiorgan involvement and is very high risk for deterioration and death.  Seems improving now.   Objective: Vitals:   05/30/20 0500 05/30/20 0714 05/30/20 1120 05/30/20 1555  BP:  (!) 147/89 106/69 123/80  Pulse:  90 74 81  Resp:  '15 18 18  ' Temp:  98.3 F (36.8 C) 98.1 F (36.7 C) 98.9 F (37.2 C)  TempSrc:  Oral Oral Oral  SpO2:  96% 100% 100%  Weight: 60.7 kg     Height:        Intake/Output Summary (Last 24 hours) at 05/30/2020 1810 Last data filed at 05/30/2020 1308 Gross per 24 hour  Intake 480 ml  Output 1000 ml  Net -520 ml   Filed Weights   05/28/20 0408 05/29/20 0500 05/30/20 0500  Weight: 64.9 kg 62.2 kg 60.7 kg     Examination:  General.  Chronically ill-appearing gentleman,   in no acute distress. Pulmonary.  Lungs clear bilaterally, normal respiratory effort. CV.  Regular rate and rhythm, no JVD, rub or murmur. Abdomen.  Soft, nontender, nondistended, BS positive. CNS.  Alert and oriented x3.  No focal neurologic deficit. Extremities.  Trace LE edema, no cyanosis, pulses intact and symmetrical. Psychiatry.  Judgment and insight appears normal.  DVT prophylaxis: Xarelto Code Status: DNR Family Communication: No family at bedside today.  Discussed with patient. Disposition Plan:  Status is: Inpatient  Remains inpatient appropriate because:Inpatient level of care appropriate due to severity of illness   Dispo: The patient is from: Home              Anticipated d/c is to: To be determined              Anticipated d/c date is: 2 days              Patient currently is  not medically stable to d/c.  Patient seems improving.  PT recommended SNF placement.  Difficult placement due to insurance.  We will keep for next couple of days to work with physical therapy while in the hospital and then discharged home with home health services.   Consultants:   Neurology  Gastroenterology  General surgery  Palliative care  PCCM  Rheumatology  Dermatology  Procedures:  Antimicrobials:   Data Reviewed: I have personally reviewed following labs and imaging studies  CBC: Recent Labs  Lab 05/24/20 0409 05/25/20 0312 05/26/20 0514 05/27/20 0650 05/28/20 0416  WBC 16.9* 10.3 8.5 9.2 9.9  HGB 7.9* 8.5* 9.4* 9.4* 9.7*  HCT 23.9* 25.7* 28.8* 27.6* 28.6*  MCV 98.0 92.4 94.7 92.3 92.0  PLT 130* 178 226 270 373   Basic Metabolic Panel: Recent Labs  Lab 05/24/20 0409 05/24/20 0409 05/24/20 0550 05/25/20 0312 05/25/20 0312 05/26/20 0514 05/27/20 0650 05/28/20 0416 05/29/20 0425 05/30/20 0433  NA 128*   < > 132* 134*  --  135 135 134*  --   --   K 5.9*   < > 4.3 3.9  --  3.9 3.7 3.8   --   --   CL 93*   < > 94* 97*  --  95* 98 101  --   --   CO2 27   < > 30 28  --  '28 26 28  ' --   --   GLUCOSE 939*   < > 174* 86  --  93 98 108*  --   --   BUN 18   < > 18 19  --  '16 10 12  ' --   --   CREATININE 0.51*   < > 0.50* 0.60*  --  0.63 0.51* 0.49*  --   --   CALCIUM 8.3*   < > 8.2* 8.3*  --  8.8* 8.6* 8.6*  --   --   MG 2.7*   < >  --  1.6*   < > 1.9 1.6* 1.9 1.7 1.9  PHOS 4.5  --   --  5.0*  --   --  5.4*  --   --   --    < > = values in this interval not displayed.   GFR: Estimated Creatinine Clearance: 111.7 mL/min (A) (by C-G formula based on SCr of 0.49 mg/dL (L)). Liver Function Tests: Recent Labs  Lab 05/24/20 0409 05/25/20 0312 05/26/20 0514 05/27/20 0650 05/28/20 0416  AST 80* 89* 54* 31 26  ALT 83* 112* 93* 65* 52*  ALKPHOS 137* 138* 123 104 106  BILITOT 0.5 0.6 0.4 0.8 0.6  PROT 4.7* 5.1* 5.4* 5.4* 5.8*  ALBUMIN 1.5* 1.7* 2.1* 2.2* 2.5*   Recent Labs  Lab 05/26/20 0514  LIPASE 84*   No results for input(s): AMMONIA in the last 168 hours. Coagulation Profile: No results for input(s): INR, PROTIME in the last 168 hours. Cardiac Enzymes: No results for input(s): CKTOTAL, CKMB, CKMBINDEX, TROPONINI in the last 168 hours. BNP (last 3 results) No results for input(s): PROBNP in the last 8760 hours. HbA1C: No results for input(s): HGBA1C in the last 72 hours. CBG: Recent Labs  Lab 05/25/20 0021 05/25/20 0532 05/26/20 0112 05/26/20 0542 05/26/20 1119  GLUCAP 122* 84 95 73 89   Lipid Profile: No results for input(s): CHOL, HDL, LDLCALC, TRIG, CHOLHDL, LDLDIRECT in the last 72 hours. Thyroid Function Tests: No results for input(s): TSH, T4TOTAL, FREET4, T3FREE, THYROIDAB in the last 72  hours. Anemia Panel: No results for input(s): VITAMINB12, FOLATE, FERRITIN, TIBC, IRON, RETICCTPCT in the last 72 hours. Sepsis Labs: No results for input(s): PROCALCITON, LATICACIDVEN in the last 168 hours.  Recent Results (from the past 240 hour(s))  Aerobic  Culture (superficial specimen)     Status: None   Collection Time: 05/21/20  2:30 PM   Specimen: Wound  Result Value Ref Range Status   Specimen Description   Final    WOUND Performed at [facility], [address], [place], [place] [address]    Special Requests   Final    NONE Performed at [facility], [place], [place] [address]    Gram Stain NO WBC SEEN NO ORGANISMS SEEN   Final   Culture   Final    NO GROWTH 2 DAYS Performed at [facility], [place] [address], [place], [place] [address]    Report Status 05/25/2020 FINAL  Final  Peritoneal Fluid culture ( includes gram stain)     Status: None   Collection Time: 05/23/20 11:41 AM   Specimen: Peritoneal Washings; Peritoneal Fluid  Result Value Ref Range Status   Specimen Description   Final    PERITONEAL Performed at [facility], [address] [place], [place] [address]    Special Requests   Final    Normal Performed at [facility], [address] [place], [place] [address]    Gram Stain   Final    WBC PRESENT,BOTH PMN AND MONONUCLEAR NO ORGANISMS SEEN CYTOSPIN SMEAR    Culture   Final    NO GROWTH 3 DAYS Performed at [facility], [place] [address], [place], [place] [address]    Report Status 05/27/2020 FINAL  Final  Body fluid culture     Status: None   Collection Time: 05/23/20  1:29 PM   Specimen: PATH Cytology Peritoneal fluid  Result Value Ref Range Status   Specimen Description   Final    PERITONEAL Performed at [facility], [address], [place], [place] [address]    Special Requests   Final    NONE Performed at [facility], [address] [place], [place] [address]    Gram Stain   Final    WBC PRESENT,BOTH PMN AND MONONUCLEAR NO ORGANISMS SEEN CYTOSPIN SMEAR    Culture   Final    NO GROWTH 3 DAYS Performed at [facility], [place] [address], [place], [place] [address]    Report Status 05/27/2020  FINAL  Final  Fungus Culture With Stain     Status: None (Preliminary result)   Collection Time: 05/23/20  1:29 PM   Specimen: PATH Cytology Peritoneal fluid  Result Value Ref Range Status   Fungus Stain Final report  Final    Comment: (NOTE) Performed At: [facility] [place], [place] [id number] Rush Farmer MD OX:[id number]    Fungus (Mycology) Culture PENDING  Incomplete   Fungal Source PERITONEAL  Final    Comment: Performed at [facility], [address] [place], [place] [address]  Aerobic Culture (superficial specimen)     Status: None   Collection Time: 05/23/20  1:29 PM   Specimen: PATH Cytology Peritoneal fluid  Result Value Ref Range Status   Specimen Description   Final    PERITONEAL Performed at [facility], [address] [place], [place] [address]    Special Requests   Final    NONE  Performed at [facility], [address] [place], [place] [address]    Gram Stain   Final    RARE WBC PRESENT, PREDOMINANTLY MONONUCLEAR NO ORGANISMS SEEN    Culture   Final    NO GROWTH 3 DAYS Performed at [place] [address] [place], [place] [address]    Report Status 05/26/2020 FINAL  Final  Fungus Culture Result     Status: None   Collection Time: 05/23/20  1:29 PM  Result Value Ref Range Status   Result 1 Comment  Final    Comment: (NOTE) KOH/Calcofluor preparation:  no fungus observed. Performed At: [facility] [place], [place] [id number] Rush Farmer MD VQ:[id number]      Radiology Studies: No results found.  Scheduled Meds:  sodium chloride   Intravenous Once   vitamin C  500 mg Oral BID   Chlorhexidine Gluconate Cloth  6 each Topical Daily   dronabinol  2.5 mg Oral BID AC   DULoxetine  20 mg Oral Daily   feeding supplement (ENSURE ENLIVE)  237 mL Oral TID BM   ferrous [id number] C-folic acid  1 capsule Oral BID PC   Gerhardt's butt cream    Topical TID   metoprolol tartrate  50 mg Oral BID   multivitamin with minerals  1 tablet Oral Daily   multivitamin-lutein  1 capsule Oral Daily   OLANZapine zydis  5 mg Oral QHS   pantoprazole  40 mg Oral Daily   rivaroxaban  20 mg Oral Q supper   sodium chloride flush  3 mL Intravenous Q12H   vitamin A  10,000 Units Oral Daily   Vitamin D (Ergocalciferol)  50,000 Units Oral Q7 days   zinc sulfate  220 mg Oral Daily   Continuous Infusions:  sodium chloride Stopped (05/13/20 1628)     LOS: 27 days   Time spent: 40 minutes.  More than 50% of that time was spent in direct patient care and discussing the plan with nursing staff and wife.  Lorella Nimrod, MD [facility]  If 7PM-7AM, please contact night-coverage [email or web address]  05/30/2020, 6:10 PM   This record has been created using Systems analyst. Errors have been sought and corrected,but may not always be located. Such creation errors do not reflect on the standard of care.

## 2020-05-30 NOTE — Addendum Note (Signed)
Addended by: Deirdre Evener on: 05/30/2020 05:18 PM   Modules accepted: Level of Service

## 2020-05-31 DIAGNOSIS — R748 Abnormal levels of other serum enzymes: Secondary | ICD-10-CM

## 2020-05-31 LAB — CBC
HCT: 28.7 % — ABNORMAL LOW (ref 39.0–52.0)
Hemoglobin: 9.7 g/dL — ABNORMAL LOW (ref 13.0–17.0)
MCH: 31.1 pg (ref 26.0–34.0)
MCHC: 33.8 g/dL (ref 30.0–36.0)
MCV: 92 fL (ref 80.0–100.0)
Platelets: 374 10*3/uL (ref 150–400)
RBC: 3.12 MIL/uL — ABNORMAL LOW (ref 4.22–5.81)
RDW: 14.5 % (ref 11.5–15.5)
WBC: 6.4 10*3/uL (ref 4.0–10.5)
nRBC: 0 % (ref 0.0–0.2)

## 2020-05-31 LAB — GLUCOSE, CAPILLARY
Glucose-Capillary: 62 mg/dL — ABNORMAL LOW (ref 70–99)
Glucose-Capillary: 133 mg/dL — ABNORMAL HIGH (ref 70–99)

## 2020-05-31 LAB — MAGNESIUM: Magnesium: 1.8 mg/dL (ref 1.7–2.4)

## 2020-05-31 MED ORDER — OLANZAPINE 5 MG PO TBDP: 5.0000 mg | ORAL_TABLET | Freq: Every day | ORAL | 0 refills | Status: DC

## 2020-05-31 MED ORDER — FE FUMARATE-B12-VIT C-FA-IFC PO CAPS: 1.0000 | ORAL_CAPSULE | Freq: Two times a day (BID) | ORAL | 3 refills | Status: DC

## 2020-05-31 MED ORDER — METOPROLOL TARTRATE 50 MG PO TABS: 50.0000 mg | ORAL_TABLET | Freq: Two times a day (BID) | ORAL | 1 refills | Status: DC

## 2020-05-31 MED ORDER — ADULT MULTIVITAMIN W/MINERALS CH: 1.0000 | ORAL_TABLET | Freq: Every day | ORAL | 1 refills | Status: DC

## 2020-05-31 MED ORDER — OCUVITE-LUTEIN PO CAPS: 1.0000 | ORAL_CAPSULE | Freq: Every day | ORAL | 1 refills | Status: DC

## 2020-05-31 MED ORDER — PANTOPRAZOLE SODIUM 40 MG PO TBEC: 40.0000 mg | DELAYED_RELEASE_TABLET | Freq: Every day | ORAL | 1 refills | Status: DC

## 2020-05-31 MED ORDER — DULOXETINE HCL 20 MG PO CPEP: 20.0000 mg | ORAL_CAPSULE | Freq: Every day | ORAL | 3 refills | Status: DC

## 2020-05-31 MED ORDER — VITAMIN A 3 MG (10000 UNIT) PO CAPS: 10000.0000 [IU] | ORAL_CAPSULE | Freq: Every day | ORAL | 0 refills | Status: DC

## 2020-05-31 MED ORDER — ZINC SULFATE 220 (50 ZN) MG PO CAPS: 220.0000 mg | ORAL_CAPSULE | Freq: Every day | ORAL | 1 refills | Status: DC

## 2020-05-31 MED ORDER — VITAMIN D (ERGOCALCIFEROL) 1.25 MG (50000 UNIT) PO CAPS: 50000.0000 [IU] | ORAL_CAPSULE | ORAL | 0 refills | Status: DC

## 2020-05-31 MED ORDER — ENSURE ENLIVE PO LIQD: 237.0000 mL | Freq: Two times a day (BID) | ORAL | Status: DC

## 2020-05-31 MED ORDER — ASCORBIC ACID 500 MG PO TABS: 500.0000 mg | ORAL_TABLET | Freq: Two times a day (BID) | ORAL | 1 refills | Status: DC

## 2020-05-31 MED ORDER — ENSURE ENLIVE PO LIQD: 237.0000 mL | Freq: Two times a day (BID) | ORAL | 12 refills | Status: DC

## 2020-05-31 MED ORDER — RIVAROXABAN 20 MG PO TABS: 20.0000 mg | ORAL_TABLET | Freq: Every day | ORAL | 1 refills | Status: DC

## 2020-05-31 NOTE — Progress Notes (Signed)
    Durable Medical Equipment  (From admission, onward)         Start     Ordered   05/31/20 1452  For home use only DME lightweight manual wheelchair with seat cushion  Once       Comments: Patient suffers from alcoholic pancreatitis which impairs their ability to perform daily activities like walking, bathing, preparing meals, dressing in the home.  A cane or walker will not resolve  issue with performing activities of daily living. A wheelchair will allow patient to safely perform daily activities. Patient is not able to propel themselves in the home using a standard weight wheelchair due to deconditioning and weakness. Patient can self propel in the lightweight wheelchair. Length of need lifetime. Accessories: elevating leg rests (ELRs), wheel locks, extensions and anti-tippers, seat cushion, and back cushion.   05/31/20 1453   05/31/20 1449  For home use only DME 3 n 1  Once        05/31/20 1449   05/31/20 1449  For home use only DME Walker rolling  Once       Question Answer Comment  Walker: With 5 Inch Wheels   Patient needs a walker to treat with the following condition Pancreatitis, alcoholic, acute      05/31/20 7373

## 2020-05-31 NOTE — Progress Notes (Signed)
PROGRESS NOTE    Shane Torr Sr.  FXT:[id number] DOB: 1986-08-06 DOA: 05/03/2020 PCP: Lorelee Market, MD   Brief Narrative:  34 year old gentleman with no medical history, chronic alcoholism, has been drinking nonstop about 12 packs of beer daily since age of 9 presented to the ED on  05/03/2020 for evaluation of progressive confusion and lethargy. He also had episode of unresponsiveness in the triage area. As per patient's fianc, patient stopped drinking about 24 hours prior to arrival and started behaving altered, confused. Also reported multiple episodes diarrhea, but no nausea or vomiting.  In ED he had 1 witnessed seizure. CT abdomen with concern of pancreatitis with elevated lipase, bilateral pleural effusions and ascites.  He was initially admitted for management of acute alcohol withdrawal and alcoholic pancreatitis.  Patient has a very complicated course of illness, underwent alcohol withdrawal.  There was some concern of SBP.  He was evaluated by surgeons on 05/06/2020 for concern of acute compartment syndrome and abdomen and found to have some intra-abdominal hypertension which was managed conservatively by placing Foley catheter and NG tube.  Paracentesis with cell count of 822, neutrophils of 79%.  Lipase was elevated at 53,000, LDH 526 and amylase of 10,000.  Cultures were negative.  Consistent with pancreatic ascites.  Repeat paracentesis was also consistent with pancreatic ascites and cultures remain negative.   Lipase continued to get worse initially with peaked at 1543, now trending down.  MRCP and repeat CT abdomens with some attenuations in pancreatic head and body, some improvement in edema.      Neurology was consulted on 05/10/2020 due to worsening altered mental status. 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  Patient did not receive any coronavirus [employer] vaccine.  He was initiated on  heparin.  EEG showed generalized background slowing.  No epileptiform activity was noted. A repeat MRI brain without contrast done on 05/15/2020 showed Residual abnormal diffusion restriction over the right posterior parietal lobe and right cerebellum, consistent with expected evolution of subacute venous infarcts.  It was started on Heparin by neurology, later transitioned to Xarelto.  Will need a 80-monthof anticoagulation.  On 05/16/2019 1 in the evening he became hypotensive and hypothermic with worsening leukocytosis and development of lactic acidosis.  He was transferred to ICU on 05/16/2020 due to the concern for developing septic shock.  He was started on pressors.  His lactate was 2 1 pro-Cal was 1.84 WBC was 40.4 and hemoglobin was 11.9.  CT abdomen done on 05/16/2020 showed peripancreatic edema with multiple pseudocyst.  The dominant pseudocyst has decreased to 2.2 cm.  There was extensive ascites with loculation around the liver right gutter and pelvis. Patient has been started on meropenem.  Although urine, blood and ascitic fluid cultures remain negative.  C. difficile colitis and GI pathogens were negative.  He completed a 9-day course of meropenem due to concern of infected pseudocyst.  Patient did developed decreased in hemoglobin and there was some concern of GI bleed.  Patient and his wife did endorse some blood in his stool before coming to the hospital.  Patient has a strong family history of colon cancer where both of his parents died of colon cancer, mother in 454sand father in 634s  GI is recommending outpatient work-up after this acute illness.   TPN was started on 05/16/2020 due to poor p.o. intake and stopped on 05/24/2020.  05/24/20.  Patient become altered  with confabulation-suspecting Wernicke's encephalopathy and he was started on high-dose thiamine.  MRI without any acute change.  Since then he remained stable, continue to improve and transferred to [place] on 05/26/2020.  Difficult  disposition as Medicaid will not pay for rehab.  Our TOC was able to found one company for home health services which we will start earlier next week.  Home health orders are in place.  Subjective: Patient has no new complaint.  He was eating well.  Wife at bedside.  He was unable to stand for more than 20 seconds with PT yesterday.  Assessment & Plan:   Principal Problem:   Acute alcoholic pancreatitis Active Problems:   Alcohol withdrawal seizure with delirium (HCC)   Hyponatremia   Hypokalemia   AKI (acute kidney injury) ([facility])   Elevated CK   Heme positive stool   Thrombocytopenia (HCC)   Acute metabolic encephalopathy   Severe malnutrition (HCC)   Sinus tachycardia   Ascites   Hypotension   Protein-calorie malnutrition, severe   Pressure injury of skin  Acute encephalopathy.  Resolved   Patient developed acute encephalopathy by pulling all his lines and trying to get out of bed.  he was confabulating.  Concern of Wernicke's  encephalopathy.  Patient was getting some thiamine with TPN. MRI without any new change. -He was started on high-dose thiamine 500 mg 3 times a day for 2 days followed by 250 mg daily for another 3 days.  Day 5 today.  Completed course.  Sepsis with septic shock.  Resolved  Transferred to ICU on 05/15/2020 due to hypothermia and hypotension requiring Levophed.  Now off the Levophed and maintaining his blood pressure.  ID was also consulted and they started him on meropenem for concern of infected pseudocyst. Patient afebrile with improvement in leukocytosis. Completed the course of meropenem.  Blood transfusion reaction.  Resolved  Patient did develop some rash and become short of breath with some pulmonary vascular congestion yesterday while getting blood transfusion. -Tolerated another transfusion after that without any problem.. Total of 3.5 units of PRBC given during current hospitalization.  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   CT venogram on 7/3 and a repeat on 7/5 with persistent thrombosis at the confluence of sinuses and left transverse dural venous sinus.  Neurology is following and they start him on heparin infusion.  Patient also had acute right cerebellum and a possible old infarcts involving parietal lobes and left occipital region, seen on MRI done on 05/12/2020.  Due to his worsening lower extremity rash which is also involving some upper extremities there is some concern of vasculitis.  Patient also has some rash/thrush in his mouth. His differential is broad at this time which includes rheumatologic disorder/vasculitis/Behcet's syndrome/malignancy.  He underwent an extensive rheumatologic and hypercoagulable work-up. Elevated ESR and CRP.  CK has been normalized. Dr. Jefm Bryant from rheumatology saw the patient and agrees about his confusing picture as he is not fitting on any definite thing.  His rash and CVT can be explained with Behcet's syndrome but that is a clinical diagnosis.  No oral or genital ulceration.  No vision or eye changes.  Vasculitis or panniculitis can be another possibility.  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 -Patient was started on Solu-Medrol 80 mg daily after the skin biopsy, he received 3 days of Solu-Medrol, was discontinued once vasculitis ruled out. -RPR and hepatitis panel negative. -ANCA titers negative. -Cardiolipin antibodies  negative. -Antithrombin III activity reduced at 59 with reduced AC [place] at 30.  Which is more consistent with increased consumption as patient has active thrombosis.  He will need a repeat testing after acute illness and needs a follow-up with hematologist as an outpatient. -Protein S levels within normal limit. -CA 19-9 mildly elevated at 81-which is very nonspecific. -Lupus anticoagulant panel with PTT lupus anticoagulant within normal limit and  DRV VT mildly elevated at 51.2, which can occur by a deficiency of one of the common path risk factor include 2, 5, 10 or fibrinogen.  Will need a repeat testing after acute illness. -Prothrombin gene mutation negative -Vitamin D level reduced at 7.39-we started him on repletion. -Folic acid was reduced-started him on supplement. -B1 within normal limit-Getting high-dose thiamine. -Protein C levels were low at 56. - factor V Leiden.  No mutation. -Switch Lovenox with Xarelto-there are some studies which shows favorable outcome in patients with CVT with Xarelto.  More studies with Coumadin but patient might not stay compliant.  He will need 6 months of anticoagulation. -Still need outpatient oncology work-up as there is no other obvious cause found for CVT. -Patient also need repeat protein C and ca19-9 levels once recovered from this acute illness.  Erythematous skin rash/pancreatic panniculitis.  Seems improving Vasculitis ruled out. -Skin biopsy was done by dermatology on 05/23/20-consistent with pancreatic panniculitis-enzymatic fat necrosis secondary to severe pancreatitis.  Lower extremity edema.  Resolved  Multifactorial as he received a lot of fluid along with lower albumin.  He did receive some doses of IV Lasix which resulted in improvement of edema. Patient was also getting IV albumin daily. -Discontinue IV albumin  Thrombocytopenia.  Resolved .  HIT antibodies are negative.  Anemia.  Hemoglobin improving, 9.7 and stable.  Received total of 3.5 units of PRBC.   Per wife he did had some bleeding per rectum at home.  Not aware of any obvious bleeding while in the hospital.  Patient is critically ill also.  Had some mild hematuria on UA done on 05/16/2020.  No gross hematuria.  CT abdomen without any retroperitoneal bleed and repeat paracentesis on 05/23/2020 was without any blood. -Check anemia panel-iron studies consistent with anemia of chronic disease.  Low folic acid, normal  F81. -Received IV folic acid for 5 days-we will discontinue IV folic acid and continue with p.o. supplement. -Continue to monitor and transfuse if below 7.  Chronic pancreatitis with pseudocyst formation.  Present on admission.  CT abdomen without any gallbladder abnormalities but did had multiple cyst in pancreatic head and body.  Lipase peaked at 1543, now trending down. Current lipase of 84. -CT abdomen was repeated 7/13 due to concern of retroperitoneal bleeding.  It shows stable pancreatitis changes with pseudocyst.  Stable ascites and pleural effusion. -Continue supportive care.  Severe protein calorie malnutrition.  POA, most likely secondary to chronic alcohol abuse.   BMI of 22.27 TPN from 05/16/2020 till 05/24/2020. -Dietitian has put their recommendations with improving p.o. intake. -Continue monitoring daily CMP, magnesium, phosphorous. -Encouraged p.o. intake.  He seems motivated and improving.  Nutritional deficiencies.  Secondary to alcohol abuse. Patient so far found to have deficiencies of vitamin A, vitamin C, vitamin D and copper.  Zinc is within lower normal limit.  Thiamine within normal limit. -Continue supplement.  Transaminitis.  Resolved. He was on TPN and also has an history of alcohol abuse. -Continue to monitor.  TSH elevation.  T4 normal.  No acute concern at this time.  Diarrhea.  Resolved.  Sinus tachycardia.  Controlled with metoprolol to 50 mg twice daily and monitor.  Pancreatic ascites.  Paracentesis labs are consistent with pancreatic ascites.  CT abdomen with mild fatty infiltrate, no cirrhosis.  Patient underwent paracentesis twice 1 on 05/07/2020 and second on 05/16/2020.  Culture remain negative.  Repeat CT abdomen on 7/13 with stable ascites. -Repeat paracentesis -on 7/14, again consistent with pancreatic ascites with negative cultures.  Pressure injuries.  High risk due to severe protein calorie malnutrition and inability to move.  Getting some  unstageable breakdown on his sacrum and bilateral heels. -Wound care consult-appreciate their recommendations. -Continue to monitor. -Frequent position changes.  AKI.  Resolved Patient had AKI on admission.  Most likely prerenal as creatinine normalized with IV hydration. -Continue to monitor.  Electrolyte abnormalities.  Has multiple electrolyte abnormalities during the course of illness which include hypokalemia, hypomagnesemia and hyponatremia which has been resolved. -Continue to monitor BMP, magnesium and phosphate.  Depression.  He seems more motivated.  Mood has improved. per wife he is feeling very down since the death of his father and drinking a lot in order to overcome his depression. -Psychiatry started him on Zyprexa and Cymbalta on 05/25/2020.  Palliative care consult.  Palliative care was consulted to discuss goals of care.  Patient and his wife will continue aggressive measures and to treat treatables.  Code was changed to DNR. Palliative signed off -as we decided to pursue aggressive measures.  Physical deconditioning.  And has severe physical deconditioning secondary to acute illness. PT are recommending SNF placement.  Placement will be hard with his insurance.  We will try getting him maximum home health services and keep him for next couple of days to get a physical therapy so he can transfer himself from bed to chair and bedside commode himself.  TOC was able to find one completely for home health services who can start early next week.  We will keep him in the hospital to work with physical therapist so at least he can transfer himself from bed to chair before going home.  Home health services ordered.  Objective: Vitals:   05/31/20 0457 05/31/20 0500 05/31/20 0759 05/31/20 1226  BP: (!) 138/98  139/87 119/80  Pulse: 91  82 73  Resp: '16  18 21  ' Temp: 98.6 F (37 C)  98 F (36.7 C) 98.2 F (36.8 C)  TempSrc: Oral  Oral Oral  SpO2: 98%  100% 100%  Weight:  59.8  kg    Height:        Intake/Output Summary (Last 24 hours) at 05/31/2020 1509 Last data filed at 05/31/2020 1403 Gross per 24 hour  Intake 360 ml  Output 1200 ml  Net -840 ml   Filed Weights   05/29/20 0500 05/30/20 0500 05/31/20 0500  Weight: 62.2 kg 60.7 kg 59.8 kg    Examination:  General.  Well-developed gentleman,  in no acute distress. Pulmonary.  Lungs clear bilaterally, normal respiratory effort. CV.  Regular rate and rhythm, no JVD, rub or murmur. Abdomen.  Soft, nontender, nondistended, BS positive. CNS.  Alert and oriented x3.  No focal neurologic deficit. Extremities.  No edema, no cyanosis, pulses intact and symmetrical.  Pressure injuries involving bilateral heels. Psychiatry.  Judgment and insight appears normal.  DVT prophylaxis: Xarelto Code Status: DNR Family Communication: Wife was updated at bedside. Disposition Plan:  Status is: Inpatient  Remains inpatient appropriate because:Inpatient level of care appropriate due to severity of illness   Dispo:  The patient is from: Home              Anticipated d/c is to: To be determined              Anticipated d/c date is: 2 days              Patient currently is medically stable.  Patient seems improving.  PT recommended SNF placement.  Difficult placement due to insurance.  We will keep for next couple of days to work with physical therapy while in the hospital and then discharged home with home health services.  Able to find home health services which she will start early next week. Wife is unable to take care of him alone due to severe physical deconditioning.  Consultants:   Neurology  Gastroenterology  General surgery  Palliative care  PCCM  Rheumatology  Dermatology  Procedures:  Antimicrobials:   Data Reviewed: I have personally reviewed following labs and imaging studies  CBC: Recent Labs  Lab 05/25/20 0312 05/26/20 0514 05/27/20 0650 05/28/20 0416 05/31/20 0413  WBC 10.3 8.5 9.2  9.9 6.4  HGB 8.5* 9.4* 9.4* 9.7* 9.7*  HCT 25.7* 28.8* 27.6* 28.6* 28.7*  MCV 92.4 94.7 92.3 92.0 92.0  PLT 178 226 270 338 518   Basic Metabolic Panel: Recent Labs  Lab 05/25/20 0312 05/25/20 0312 05/26/20 0514 05/26/20 0514 05/27/20 0650 05/28/20 0416 05/29/20 0425 05/30/20 0433 05/31/20 0413  NA 134*  --  135  --  135 134*  --   --   --   K 3.9  --  3.9  --  3.7 3.8  --   --   --   CL 97*  --  95*  --  98 101  --   --   --   CO2 28  --  28  --  26 28  --   --   --   GLUCOSE 86  --  93  --  98 108*  --   --   --   BUN 19  --  16  --  10 12  --   --   --   CREATININE 0.60*  --  0.63  --  0.51* 0.49*  --   --   --   CALCIUM 8.3*  --  8.8*  --  8.6* 8.6*  --   --   --   MG 1.6*   < > 1.9   < > 1.6* 1.9 1.7 1.9 1.8  PHOS 5.0*  --   --   --  5.4*  --   --   --   --    < > = values in this interval not displayed.   GFR: Estimated Creatinine Clearance: 110 mL/min (A) (by C-G formula based on SCr of 0.49 mg/dL (L)). Liver Function Tests: Recent Labs  Lab 05/25/20 0312 05/26/20 0514 05/27/20 0650 05/28/20 0416  AST 89* 54* 31 26  ALT 112* 93* 65* 52*  ALKPHOS 138* 123 104 106  BILITOT 0.6 0.4 0.8 0.6  PROT 5.1* 5.4* 5.4* 5.8*  ALBUMIN 1.7* 2.1* 2.2* 2.5*   Recent Labs  Lab 05/26/20 0514  LIPASE 84*   No results for input(s): AMMONIA in the last 168 hours. Coagulation Profile: No results for input(s): INR, PROTIME in the last 168 hours. Cardiac Enzymes: No results for input(s): CKTOTAL, CKMB, CKMBINDEX, TROPONINI in the last 168 hours. BNP (last 3 results) No results for input(s): PROBNP in the last  8760 hours. HbA1C: No results for input(s): HGBA1C in the last 72 hours. CBG: Recent Labs  Lab 05/26/20 0112 05/26/20 0542 05/26/20 1119 05/31/20 0801 05/31/20 1021  GLUCAP 95 73 89 62* 133*   Lipid Profile: No results for input(s): CHOL, HDL, LDLCALC, TRIG, CHOLHDL, LDLDIRECT in the last 72 hours. Thyroid Function Tests: No results for input(s): TSH,  T4TOTAL, FREET4, T3FREE, THYROIDAB in the last 72 hours. Anemia Panel: No results for input(s): VITAMINB12, FOLATE, FERRITIN, TIBC, IRON, RETICCTPCT in the last 72 hours. Sepsis Labs: No results for input(s): PROCALCITON, LATICACIDVEN in the last 168 hours.  Recent Results (from the past 240 hour(s))  Peritoneal Fluid culture ( includes gram stain)     Status: None   Collection Time: 05/23/20 11:41 AM   Specimen: Peritoneal Washings; Peritoneal Fluid  Result Value Ref Range Status   Specimen Description   Final    PERITONEAL Performed at [facility], [address] [place], [place] [address]    Special Requests   Final    Normal Performed at [facility], [address] [place], [place] [address]    Gram Stain   Final    WBC PRESENT,BOTH PMN AND MONONUCLEAR NO ORGANISMS SEEN CYTOSPIN SMEAR    Culture   Final    NO GROWTH 3 DAYS Performed at [facility], [place] [address], [place], [place] [address]    Report Status 05/27/2020 FINAL  Final  Body fluid culture     Status: None   Collection Time: 05/23/20  1:29 PM   Specimen: PATH Cytology Peritoneal fluid  Result Value Ref Range Status   Specimen Description   Final    PERITONEAL Performed at [facility], [address], [place], [place] [address]    Special Requests   Final    NONE Performed at [facility], [address] [place], [place] [address]    Gram Stain   Final    WBC PRESENT,BOTH PMN AND MONONUCLEAR NO ORGANISMS SEEN CYTOSPIN SMEAR    Culture   Final    NO GROWTH 3 DAYS Performed at [facility], [place] [address], [place], [place] [address]    Report Status 05/27/2020 FINAL  Final  Fungus Culture With Stain     Status: None (Preliminary result)   Collection Time: 05/23/20  1:29 PM   Specimen: PATH Cytology Peritoneal fluid  Result Value Ref Range Status   Fungus Stain Final report  Final    Comment: (NOTE) Performed At: [facility] [place], [place] [id number] Rush Farmer MD NZ:[id number]    Fungus (Mycology) Culture PENDING  Incomplete   Fungal Source PERITONEAL  Final    Comment: Performed at [facility], [address], [place], [place] [address]  Aerobic Culture (superficial specimen)     Status: None   Collection Time: 05/23/20  1:29 PM   Specimen: PATH Cytology Peritoneal fluid  Result Value Ref Range Status   Specimen Description   Final    PERITONEAL Performed at [facility], [address] [place], [place] [address]    Special Requests   Final    NONE Performed at [facility], [address] [place], [place] [address]    Gram Stain   Final    RARE WBC PRESENT, PREDOMINANTLY MONONUCLEAR NO ORGANISMS SEEN    Culture   Final    NO GROWTH 3 DAYS Performed at [facility], [place] [address],  [place], [place] [address]    Report Status 05/26/2020 FINAL  Final  Fungus Culture Result     Status: None   Collection Time: 05/23/20  1:29 PM  Result Value Ref Range Status   Result 1 Comment  Final    Comment: (NOTE) KOH/Calcofluor preparation:  no fungus observed. Performed At: [facility] [place], [place] [id number] Rush Farmer MD CZ:[id number]      Radiology Studies: No results found.  Scheduled Meds:  sodium chloride   Intravenous Once   vitamin C  500 mg Oral BID   Chlorhexidine Gluconate Cloth  6 each Topical Daily   dronabinol  2.5 mg Oral BID AC   DULoxetine  20 mg Oral Daily   feeding supplement (ENSURE ENLIVE)  237 mL Oral BID BM   ferrous [id number] C-folic acid  1 capsule Oral BID PC   Gerhardt's butt cream   Topical TID   metoprolol tartrate  50 mg Oral BID   multivitamin with minerals  1 tablet Oral Daily   multivitamin-lutein  1 capsule Oral Daily   OLANZapine zydis  5 mg Oral QHS   pantoprazole  40 mg Oral Daily   rivaroxaban  20 mg Oral Q supper   sodium chloride flush  3 mL  Intravenous Q12H   vitamin A  10,000 Units Oral Daily   Vitamin D (Ergocalciferol)  50,000 Units Oral Q7 days   zinc sulfate  220 mg Oral Daily   Continuous Infusions:  sodium chloride Stopped (05/13/20 1628)     LOS: 28 days   Time spent: 30 minutes.  More than 50% of that time was spent in direct patient care and discussing the plan with nursing staff and wife.  Lorella Nimrod, MD [facility]  If 7PM-7AM, please contact night-coverage [email or web address]  05/31/2020, 3:09 PM   This record has been created using Systems analyst. Errors have been sought and corrected,but may not always be located. Such creation errors do not reflect on the standard of care.

## 2020-05-31 NOTE — TOC Progression Note (Signed)
Transition of Care (TOC) - Progression Note    Patient Details  Name: Shane Petrucelli Sr. MRN: [id number] Date of Birth: [birth year]  Transition of Care [facility]) CM/SW Contact  Allayne Butcher, RN Phone Number: 05/31/2020, 3:05 PM  Clinical Narrative:    [facility] agreed to accept the referral for home health services for RN, PT, OT and aide.  Patient will need a 3 in 1 and wheelchair at home.  [employer] will provide equipment and bring to the room before discharge.  Plan to discharge over the weekend or Monday.  Patient is still unable to walk any distance and requires further PT.     Expected Discharge Plan: Home w Home Health Services Barriers to Discharge: Continued Medical Work up  Expected Discharge Plan and Services Expected Discharge Plan: Home w Home Health Services   Discharge Planning Services: CM Consult Post Acute Care Choice: Home Health                   DME Arranged: 3-N-1, Lightweight manual wheelchair with seat cushion DME Agency: [employer] Date DME Agency Contacted: 05/31/20 Time DME Agency Contacted: 1504 Representative spoke with at DME Agency: Oletha Cruel HH Arranged: RN, PT, OT, Nurse's Aide HH Agency: [facility] Date [facility] Agency Contacted: 05/31/20 Time HH Agency Contacted: 1504 Representative spoke with at [facility] Agency: Wayne Both   Social Determinants of Health (SDOH) Interventions    Readmission Risk Interventions No flowsheet data found.

## 2020-05-31 NOTE — Progress Notes (Signed)
Physical Therapy Treatment Patient Details Name: Shane Voller Sr. MRN: [id number] DOB: 1985/12/11 Today's Date: 05/31/2020    History of Present Illness 34 y/o male who has a long history of alcohol abuse who presented to the ED (6/24) in a state of withdrawal and was witnessed to have a seizure; no seizure activity reported since. He was found to have acute alcoholic pancreatitis. Pt has been drinking since he was 18 and per chart, drinks two 12-packs a day. 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 05/15/2020 showed Residual abnormal diffusion restriction over the right posterior parietal lobe and right cerebellum, consistent with expected evolution of subacute venous infarcts.    PT Comments    Patient is making progress with functional independence and mobility. Patient is Mod I for bed mobility. Patient able to stand 4 times with stand by assistance initially progressing to Min A with increased standing bouts due to fatigue. Patient ambulated 12 ft x 2 bouts with rolling walker and Min guard-Min A for safety.  Cues for technique using rolling walker for UE support. Patient required short seated rest break between bouts of walking with heart rate increasing briefly into 150's and decreasing to 106 bpm with rest break.  Patient's fiance in the room and both patient and family are eager for patient to go home. Educated patient on energy conservation techniques and to be aware of need for rest to prevent falls at home as patient continues to have limited endurance and with generalized weakness. If patient is to be discharge home, he will need physical assistance with mobility for safety and fall prevention. As SNF does not seem to be an option for discharge, recommend HHPT with family assistance with all mobility. Recommend to use wheelchair initially until he progresses mobility safely with therapy at home.    Follow Up  Recommendations  Supervision/Assistance - 24 hour;Home health PT      Equipment Recommendations  Wheelchair (measurements PT);3in1 (PT) (patient reports he has rolling walker at home )    Recommendations for Other Services       Precautions / Restrictions Precautions Precautions: Fall Precaution Comments: less impulsive behavior today  Restrictions Weight Bearing Restrictions: No    Mobility  Bed Mobility Overal bed mobility: Modified Independent Bed Mobility: Supine to Sit;Sit to Supine     Supine to sit: Modified independent (Device/Increase time)        Transfers     Transfers: Sit to/from Stand Sit to Stand: Min assist (SBA and Min A )         General transfer comment: 4 bouts of standing performed. Patient able to stand x 3 bouts with stand by assistance and last bout with Min A likely due to fatigue. verbal cues for sequencing and technique   Ambulation/Gait Ambulation/Gait assistance: Min guard;Min assist Gait Distance (Feet): 12 Feet (ambulated 12 ft x 2 for total of 24 ft ) Assistive device: Rolling walker (2 wheeled) Gait Pattern/deviations: Step-to pattern     General Gait Details: narrow base of support, cues for safety using rolling walker for UE support    Stairs         General stair comments: stairs not attempted. however patient was able stand and lift each LE enough to clear a small step with rolling walker for UE support. Min A for safety    Wheelchair Mobility    Modified Rankin (Stroke Patients Only)  Balance Overall balance assessment: Needs assistance Sitting-balance support: Feet supported;No upper extremity supported Sitting balance-Leahy Scale: Good     Standing balance support: Bilateral upper extremity supported Standing balance-Leahy Scale: Fair Standing balance comment: using rolling walker for UE support. standing tolerance over  one minute this session                             Cognition  Arousal/Alertness: Awake/alert Behavior During Therapy:  (mildly impulsive ) Overall Cognitive Status: Impaired/Different from baseline Area of Impairment: Safety/judgement                       Following Commands: Follows one step commands consistently;Follows multi-step commands consistently Safety/Judgement: Decreased awareness of deficits;Decreased awareness of safety            Exercises      General Comments        Pertinent Vitals/Pain Pain Assessment: No/denies pain    Home Living                      Prior Function            PT Goals (current goals can now be found in the care plan section) Acute Rehab PT Goals Patient Stated Goal: to walk again  PT Goal Formulation: With patient Time For Goal Achievement: 06/07/20 Potential to Achieve Goals: Fair Progress towards PT goals: Progressing toward goals    Frequency    Min 2X/week      PT Plan Discharge plan needs to be updated    Co-evaluation              AM-PAC PT "6 Clicks" Mobility   Outcome Measure  Help needed turning from your back to your side while in a flat bed without using bedrails?: A Little Help needed moving from lying on your back to sitting on the side of a flat bed without using bedrails?: A Little Help needed moving to and from a bed to a chair (including a wheelchair)?: A Little Help needed standing up from a chair using your arms (e.g., wheelchair or bedside chair)?: A Little Help needed to walk in hospital room?: A Little Help needed climbing 3-5 steps with a railing? : A Little 6 Click Score: 18    End of Session Equipment Utilized During Treatment: Gait belt Activity Tolerance: Patient tolerated treatment well Patient left: in bed;with call bell/phone within reach;with bed alarm set Nurse Communication: Mobility status PT Visit Diagnosis: Muscle weakness (generalized) (M62.81);Difficulty in walking, not elsewhere classified (R26.2);Unsteadiness on  feet (R26.81)     Time: 4166-0630 PT Time Calculation (min) (ACUTE ONLY): 30 min  Charges:  $Gait Training: 8-22 mins $Therapeutic Activity: 8-22 mins                     Donna Bernard, PT, MPT    Ina Homes 05/31/2020, 4:24 PM

## 2020-05-31 NOTE — Progress Notes (Signed)
Nutrition Follow-up  DOCUMENTATION CODES:   Severe malnutrition in context of social or environmental circumstances  INTERVENTION:  Provide Ensure Enlive po BID, each supplement provides 350 kcal and 20 grams of protein.  Continue vitamin/mineral supplementation: -MVI po daily -vitamin C 500 mg po BID -vitamin A 10,000 units po daily x 30 days -Ocuvite po daily (provides zinc, vitamin A, vitamin C, vitamin E, copper, and selenium) -vitamin D 50,000 units po weekly  Also recommend providing thiamine 100 mg po daily and folic acid 1 mg po daily.  NUTRITION DIAGNOSIS:   Severe Malnutrition related to social / environmental circumstances (EtOH abuse) as evidenced by severe fat depletion, severe muscle depletion.  Ongoing.  GOAL:   Patient will meet greater than or equal to 90% of their needs  Met.  MONITOR:   PO intake, Supplement acceptance, Labs, Weight trends, Skin, I & O's  REASON FOR ASSESSMENT:   Consult Assessment of nutrition requirement/status, Other (Comment), New TPN/TNA  ASSESSMENT:   34 year old male with PMHx of EtOH abuse admitted with alcohol withdrawal, acute alcoholic pancreatitis, acute metabolic encephalopathy, ascites s/p paracentesis on 6/28 with 2.8 L fluid removed, chest pain, acute renal failure.  Met with patient and his wife at bedside. Patient was sitting up in bed and eating breakfast. He reports his appetite is much better now and he is eating well at meals. Patient is eating 50-80% of his meals at this time. However, he is ordering very large meals so he is obtaining adequate calories in protein. Yesterday he had 1 Magic Cup and 1 bottle of Ensure Enlive in addition to his meals. In the past 24 hours patient has had approximately 2378 kcal (>100% estimated kcal needs) and 88 grams of protein (93% minimum estimated protein needs). Patient and wife report plan is for possible discharge later today.  Medications reviewed and include: vitamin C 500  mg BID, Marinol 2.5 mg BID before lunch and dinner, Trinsicon/Foltrin 1 capsule BID, MVI daily, Ocuvite daily, Protonix, vitamin A 10000 units daily, vitamin D 50000 units every 7 days, zinc sulfate 220 mg daily.  Labs reviewed: CBG 62.  Diet Order:   Diet Order            Diet regular Room service appropriate? Yes; Fluid consistency: Thin  Diet effective now                EDUCATION NEEDS:   No education needs have been identified at this time  Skin:  Skin Assessment: Skin Integrity Issues: Skin Integrity Issues:: Stage II, Stage III, Other (Comment) Stage II: right heel (3cm x 2cm x 0.1cm) Stage III: left heel (6cm x 8cm x 0.2cm) Other: partial thickness dry scabbed wound left calf (3cm x 1cm)   Last BM:  05/29/2020 - medium type 6  Height:   Ht Readings from Last 1 Encounters:  05/16/20 '5\' 7"'  (1.702 m)   Weight:   Wt Readings from Last 1 Encounters:  05/31/20 59.8 kg   Ideal Body Weight:  83.6 kg  BMI:  Body mass index is 20.65 kg/m.  Estimated Nutritional Needs:   Kcal:  1900-2200  Protein:  95-105 grams  Fluid:  1.9-2.2 L/day  Jacklynn Barnacle, MS, RD, LDN Pager number available on Amion

## 2020-05-31 NOTE — Discharge Summary (Signed)
Physician Discharge Summary  Shane Gettis Sr. WFU:[id number] DOB: December 16, 1985 DOA: 05/03/2020  PCP: Lorelee Market, MD  Admit date: 05/03/2020 Discharge date: 05/31/2020  Admitted From: Home Disposition:  Home  Recommendations for Outpatient Follow-up:  1. Follow up with PCP in 1-2 weeks 2. Please obtain BMP/CBC in one week 3. Please follow up on the following pending results:None  Home Health:Yes Equipment/Devices: Wheelchair, bedside commode, rolling walker Discharge Condition: Stable CODE STATUS: DNR Diet recommendation: Heart Healthy  Brief/Interim Summary: 34 year old gentleman with no medical history, chronic alcoholism, has been drinking nonstop about 12 packs of beer daily since age of 28 presented to the ED on 05/03/2020 for evaluation of progressive confusion and lethargy. He also had episode of unresponsiveness in the triage area. As per patient's fianc, patient stopped drinking about 24 hours prior to arrival and started behaving altered, confused. Also reported multiple episodes diarrhea, but no nausea or vomiting.  In ED he had 1 witnessed seizure. CT abdomen with concern of pancreatitis with elevated lipase, bilateral pleural effusions and ascites.  He was initially admitted for management of acute alcohol withdrawal and alcoholic pancreatitis.  Patient has a very complicated course of illness, underwent alcohol withdrawal.  There was some concern of SBP.  He was evaluated by surgeons on 05/06/2020 for concern of acute compartment syndrome and abdomen and found to have some intra-abdominal hypertension which was managed conservatively by placing Foley catheter and NG tube.  Paracentesis with cell count of 822, neutrophils of 79%.  Lipase was elevated at 53,000, LDH 526 and amylase of 10,000.  Cultures were negative.  Consistent with pancreatic ascites.  Repeat paracentesis was also consistent with pancreatic ascites and cultures remain negative.   Lipase continued to get  worse initially with peaked at 1543, now trending down.  Last check was 84. MRCP and repeat CT abdomens with some attenuations in pancreatic head and body, some improvement in edema.      Neurology was consulted on 05/10/2020 due to worsening altered mental status. 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 Patient did not receive any coronavirus [employer] vaccine. He was initiated on heparin. EEG showed generalized background slowing. No epileptiform activity was noted. A repeat MRI brain without contrast done on 05/15/2020 showedResidual abnormal diffusion restriction over the right posterior parietal lobe and right cerebellum, consistent with expected evolution of subacute venous infarcts.  It was started on Heparin by neurology, later transitioned to Xarelto.  Will need a 34-monthof anticoagulation. CT venogram on 7/3 and a repeat on 7/5 with persistent thrombosis at the confluence of sinuses and left transverse dural venous sinus.  Neurology is following and they start him on heparin infusion.  Patient also had acute right cerebellum and a possible old infarcts involving parietal lobes and left occipital region, seen on MRI done on 05/12/2020.  Patient underwent extensive work-up for his venous sinus thrombosis.  He also developed lower extremity rash and rheumatology was consulted.  Also underwent rheumatologic work-up for any underlying vasculitis which was negative.  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 His extensive lab work was only positive for inconclusive mildly elevated CA 19-9 at 81 and mild protein C deficiency at 541  Antithrombin levels were mildly low.  These can be explained due to his acute illness.  Patient will need a follow-up with hematology in 4 to 6 weeks for repetition of these labs. Patient will also need follow-up  with gastroenterology in 4 to 6 weeks after  resolution of this acute illness for colonoscopy.  Patient has an extensive family history of colon cancer and need oncologic work-up for his venous sinus thrombosis.  On 05/15/2020 in the evening he became hypotensive and hypothermic with worsening leukocytosis and development of lactic acidosis. He was transferred to ICU on 05/16/2020 due to the concern for developing septic shock. He was started on pressors. His lactate was 2 1 pro-Cal was 1.84 WBC was 40.4 and hemoglobin was 11.9. CT abdomen done on 05/16/2020 showed peripancreatic edema with multiple pseudocyst. The dominant pseudocyst has decreased to 2.2 cm. There was extensive ascites with loculation around the liver right gutter and pelvis. Patient has been started on meropenem.  Although urine, blood and ascitic fluid cultures remain negative.  C. difficile colitis and GI pathogens were negative.  He completed a 9-day course of meropenem due to concern of infected pseudocyst.  Patient did developed decreased in hemoglobin and there was some concern of GI bleed.  Patient and his wife did endorse some blood in his stool before coming to the hospital.  Patient has a strong family history of colon cancer where both of his parents died of colon cancer, mother in 76s and father in 58s.  GI is recommending outpatient work-up after this acute illness.  Patient received 3.5 unit of PRBC.  His hemoglobin was stable at 9.7 on discharge.  Patient did developed ascites which was tapped twice during current hospitalization.  Labs were consistent with pancreatic ascites.  Cultures remain negative.  Patient did developed pressure injuries due to severe protein calorie malnutrition and inability to move.  He got some unstageable breakdown on his sacrum and bilateral heels.  Wound care was involved and he started healing before discharge.  He will get wound care from home health services at home.  Patient did develop multiple lab abnormalities including AKI,  multiple electrolyte abnormalities and transaminitis which resolved before discharge.  Patient also found to have multiple nutritional deficiencies most likely secondary to his alcohol use.  Which include vitamin D, C, A and copper. He was started on multiple supplements and started responding well.  Over the course of illness he developed thrombocytopenia which resolved before discharge.  Patient also developed persistent tachycardia and he was started on metoprolol with improvement in his heart rate.  Blood pressure remained within goal and he was discharged on metoprolol.  His primary care physician can monitor and manage follow-up from this point onward.  Patient was depressed after the death of his father resulted in worsening of his alcohol use which resulted in this severe acute pancreatitis and illness.  Psychiatry was also consulted and he was started on Cymbalta and Zyprexa with good response.  He was extensively counseled against alcohol and tobacco abuse.  And will need a continuation of counseling from his primary care provider.   He was on TPN from 05/16/2020 till 05/24/2020.  05/24/20.  Patient become altered with confabulation-suspecting Wernicke's encephalopathy and he was started on high-dose thiamine.  MRI without any acute change.  He completed 5-day course of high-dose thiamine.  Since then he remained stable, continue to improve and transferred to [place] on 05/26/2020.  Difficult disposition as Medicaid will not pay for rehab.  Patient was eventually discharged with home health services.  He was able to walk 10 to 12 feet with the help of PT on discharge.  Patient has severe physical deconditioning due to acute illness and will need some  time to recover.  Patient was seriously ill for a long time.  Although improving but remains high risk.  Palliative care was also consulted during his course of illness, there was some talked about transitioning to comfort measures.  Family  would like to pursue aggressive measures and palliative care later signed off.  CODE STATUS changed from full to DNR.  He will need a discussion again regarding CODE STATUS once recovered from this illness.  Discharge Diagnoses:  Principal Problem:   Acute alcoholic pancreatitis Active Problems:   Delirium tremens (HCC)   Hyponatremia   Hypokalemia   AKI (acute kidney injury) ([place])   Elevated CK   Heme positive stool   Thrombocytopenia (HCC)   Acute metabolic encephalopathy   Severe malnutrition (HCC)   Sinus tachycardia   Abdominal ascites   Hypotension   Protein-calorie malnutrition, severe   Pressure injury of skin   Discharge Instructions  Discharge Instructions    Diet - low sodium heart healthy   Complete by: As directed    Discharge instructions   Complete by: As directed    It was pleasure taking care of you. As we discussed that you will stay away from alcohol and smoking. Continue with your supplements, well-balanced diet and keep yourself well-hydrated. Your weakness will improve with time, work with your physical therapy and on your own. Keep your heels off the bed so they can heal. Follow-up with your primary care provider within 1 week. You need a follow-up with gastroenterology and hematology in 4 to 6 weeks. As we discussed you need a colonoscopy once recovered from this illness completely. Follow-up with hematology to repeat some labs which were little abnormal but not conclusive to any diagnosis.   Discharge wound care:   Complete by: As directed    Wound care according to wound care recommendations as listed above   Increase activity slowly   Complete by: As directed      Allergies as of 05/31/2020      Reactions   Sudafed [pseudoephedrine]    History per pt of this being linked with his seizures      Medication List    TAKE these medications   ascorbic acid 500 MG tablet Commonly known as: VITAMIN C Take 1 tablet (500 mg total) by mouth 2  (two) times daily.   DULoxetine 20 MG capsule Commonly known as: CYMBALTA Take 1 capsule (20 mg total) by mouth daily. Start taking on: June 01, 2020   feeding supplement (ENSURE ENLIVE) Liqd Take 237 mLs by mouth 2 (two) times daily between meals.   ferrous [id number] C-folic acid capsule Commonly known as: TRINSICON / FOLTRIN Take 1 capsule by mouth 2 (two) times daily after a meal.   metoprolol tartrate 50 MG tablet Commonly known as: LOPRESSOR Take 1 tablet (50 mg total) by mouth 2 (two) times daily.   multivitamin with minerals Tabs tablet Take 1 tablet by mouth daily. Start taking on: June 01, 2020   multivitamin-lutein Caps capsule Take 1 capsule by mouth daily.   OLANZapine zydis 5 MG disintegrating tablet Commonly known as: ZYPREXA Take 1 tablet (5 mg total) by mouth at bedtime.   pantoprazole 40 MG tablet Commonly known as: PROTONIX Take 1 tablet (40 mg total) by mouth daily. Start taking on: June 01, 2020   rivaroxaban 20 MG Tabs tablet Commonly known as: XARELTO Take 1 tablet (20 mg total) by mouth daily with supper.   vitamin A 3 MG (10000 UNITS) capsule Take  1 capsule (10,000 Units total) by mouth daily.   Vitamin D (Ergocalciferol) 1.25 MG (50000 UNIT) Caps capsule Commonly known as: DRISDOL Take 1 capsule (50,000 Units total) by mouth every 7 (seven) days. Start taking on: June 06, 2020   zinc sulfate 220 (50 Zn) MG capsule Take 1 capsule (220 mg total) by mouth daily.            Durable Medical Equipment  (From admission, onward)         Start     Ordered   05/31/20 1452  For home use only DME lightweight manual wheelchair with seat cushion  Once       Comments: Patient suffers from alcoholic pancreatitis which impairs their ability to perform daily activities like walking, bathing, preparing meals, dressing in the home.  A cane or walker will not resolve  issue with performing activities of daily living. A wheelchair will allow  patient to safely perform daily activities. Patient is not able to propel themselves in the home using a standard weight wheelchair due to deconditioning and weakness. Patient can self propel in the lightweight wheelchair. Length of need lifetime. Accessories: elevating leg rests (ELRs), wheel locks, extensions and anti-tippers, seat cushion, and back cushion.   05/31/20 1453   05/31/20 1449  For home use only DME 3 n 1  Once        05/31/20 1449   05/31/20 1449  For home use only DME Walker rolling  Once       Question Answer Comment  Walker: With [place]   Patient needs a walker to treat with the following condition Pancreatitis, alcoholic, acute      76/81/15 1449           Discharge Care Instructions  (From admission, onward)         Start     Ordered   05/31/20 0000  Discharge wound care:       Comments: Wound care according to wound care recommendations as listed above   05/31/20 1644          Follow-up Information    Lorelee Market, MD. Schedule an appointment as soon as possible for a visit.   Specialty: Family Medicine Contact information: [place] [phone]              Allergies  Allergen Reactions  . Sudafed [Pseudoephedrine]     History per pt of this being linked with his seizures    Consultations:  Neurology  Gastroenterology  General surgery  Palliative care  PCCM  Rheumatology  Dermatology  Procedures/Studies: EEG  Result Date: 05/14/2020 Alexis Goodell, MD     05/14/2020  2:01 PM ELECTROENCEPHALOGRAM REPORT Patient: Shane Bailiff Sr.       Room #: 109A-AA EEG No. ID: [id number] Age: 34 y.o.        Sex: male Requesting Physician: [place] Ped Report Date:  05/14/2020       Interpreting Physician: Alexis Goodell History: Adriaan Maltese Sr. is an 34 y.o. male with altered mental status Medications: Heparin, Lipitor, COreg, Zosyn, Aldactone, Thiamine Conditions of Recording:  This is a 21 channel routine scalp  EEG performed with bipolar and monopolar montages arranged in accordance to the international 10/20 system of electrode placement. One channel was dedicated to EKG recording. The patient is in the awake state. Description:  Artifact is prominent during the recording often obscuring the background rhythm due to a low voltage beta activity that is diffusely  distributed.  When able to be visualized the background is slow and poorly organized and consists of activity in the delta theta continuum that is seen diffusely.  This activity is continuous throughout the recording. No epileptiform activity is noted. Hyperventilation was not performed.  Intermittent photic stimulation was performed but failed to illicit any change in the tracing. IMPRESSION: This is an abnormal EEG secondary to general background slowing.  This finding may be seen with a diffuse disturbance that is etiologically nonspecific, but may include a metabolic encephalopathy, among other possibilities.  No epileptiform activity is noted.  Alexis Goodell, MD Neurology [phone] 05/14/2020, 1:56 PM   CT ABDOMEN PELVIS WO CONTRAST  Result Date: 05/22/2020 CLINICAL DATA:  Anemia EXAM: CT ABDOMEN AND PELVIS WITHOUT CONTRAST TECHNIQUE: Multidetector CT imaging of the abdomen and pelvis was performed following the standard protocol without IV contrast. COMPARISON:  05/16/2020 FINDINGS: Lower chest: Moderate bilateral pleural effusions, left greater than right, with compressive atelectasis. Findings are stable. Hepatobiliary: No focal hepatic abnormality. Gallbladder unremarkable. Pancreas: Pancreatic head cyst again noted, 2.5 cm. Peripancreatic edema again noted, similar to prior study. Small pancreatic body cyst measures 11 mm, stable. 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 Spleen: No focal abnormality.  Normal size. Adrenals/Urinary Tract: No adrenal abnormality. No focal renal abnormality. No stones or hydronephrosis. Urinary  bladder is unremarkable. Foley catheter present within the bladder. Stomach/Bowel: Stomach, large and small bowel grossly unremarkable. Previously seen rectal tube no longer visualized. Vascular/Lymphatic: No evidence of aneurysm or adenopathy. Reproductive: No visible focal abnormality. Other: Moderate ascites in the abdomen and pelvis.  No change. Musculoskeletal: No acute bony abnormality. Healing left lateral rib fractures. Diffuse anasarca again noted, stable. IMPRESSION: Moderate bilateral pleural effusions, left larger than right with compressive atelectasis. Stable. Stable ascites throughout the abdomen and pelvis and anasarca changes. Peripancreatic edema again noted with pseudo cysts in the pancreatic body and head/uncinate process. No significant change since prior study. Overall, exam is stable since recent study. Electronically Signed   By: Rolm Baptise M.D.   On: 05/22/2020 12:03   CT ABDOMEN PELVIS WO CONTRAST  Result Date: 05/16/2020 CLINICAL DATA:  Acute, severe, pancreatitis EXAM: CT ABDOMEN AND PELVIS WITHOUT CONTRAST TECHNIQUE: Multidetector CT imaging of the abdomen and pelvis was performed following the standard protocol without IV contrast. COMPARISON:  MRCP 05/12/2020 FINDINGS: Lower chest: Moderate left and small right pleural effusions with lower lobe atelectasis. Low-density blood pool suggesting anemia. Hepatobiliary: No focal liver abnormality.No evidence of biliary obstruction or stone. Pancreas: Peripancreatic edema may be improved. 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 The dominant pseudocyst at the uncinate process has decreased to 2.2 cm. A cyst in the midline pancreas is mildly increased to 15 mm. Spleen: Unremarkable. Adrenals/Urinary Tract: Negative adrenals. No hydronephrosis or stone. Collapsed bladder by Foley catheter Stomach/Bowel: Rectal tube in place. No bowel obstruction or definite inflammation. Vascular/Lymphatic: No acute vascular abnormality. No mass or adenopathy.  Reproductive:No pathologic findings. Other: Ascites with areas of loculation around the liver, right gutter, and pelvis. The volume is not changed from comparison MRCP. The thickest pocket met measures 4 cm over the liver and 7 cm inferior to the liver. Reticulation of the omentum which is likely edema by prior MRI. Anasarca. Musculoskeletal: Healing lateral left sixth and seventh rib fractures. IMPRESSION: 1. Multiple pseudocysts. Compared to 05/12/2020, the largest pseudocyst in the uncinate process has decreased in size to 2.2 cm. A midline pseudocyst has mildly increased from prior. No acute inflammation is currently seen  about the pancreas. 2. Large volume ascites with a degree of loculation along the liver, right gutter, and pelvis. 3. Left more than right pleural effusion with atelectasis, not progressed. Electronically Signed   By: Monte Fantasia M.D.   On: 05/16/2020 04:35   DG Abd 1 View  Result Date: 05/07/2020 CLINICAL DATA:  NG tube placement. EXAM: ABDOMEN - 1 VIEW COMPARISON:  Radiograph yesterday FINDINGS: Tip and side port of the enteric tube below the diaphragm in the stomach. Air within in the stomach and transverse colon. No evidence of free air. IMPRESSION: Tip and side port of the enteric tube below the diaphragm in the stomach. Electronically Signed   By: Keith Rake M.D.   On: 05/07/2020 02:08   DG Abd 1 View  Result Date: 05/06/2020 CLINICAL DATA:  Nasogastric tube placement EXAM: ABDOMEN - 1 VIEW COMPARISON:  May 06, 2020 FINDINGS: The enteric tube projects over the stomach. The bowel gas pattern is nonspecific with mildly dilated loops of small bowel and colon scattered throughout the abdomen. Oral contrast is noted within the colon. There is no pneumatosis or free air. IMPRESSION: 1. Enteric tube projects over the stomach. 2. Mildly dilated loops of small bowel and colon scattered throughout the abdomen. Electronically Signed   By: Constance Holster M.D.   On: 05/06/2020  23:00   DG Abd 1 View  Result Date: 05/06/2020 CLINICAL DATA:  Nasogastric tube placement EXAM: ABDOMEN - 1 VIEW COMPARISON:  Portable exam 1530 hours compared to 1101 hours FINDINGS: Nasogastric tube coiled in proximal stomach. Nonobstructive bowel gas pattern. Single nonspecific minimally prominent loop of small bowel in the LEFT mid abdomen. Osseous structures unremarkable. IMPRESSION: Nasogastric tube coiled in proximal stomach. Electronically Signed   By: Lavonia Dana M.D.   On: 05/06/2020 15:51   DG Abd 1 View  Result Date: 05/06/2020 CLINICAL DATA:  Abdominal distension. EXAM: ABDOMEN - 1 VIEW COMPARISON:  CT scan May 04, 2020 FINDINGS: There is contrast in the colon extending to the level the rectum. No small bowel dilatation. No free air, portal venous gas, or pneumatosis. No other acute abnormalities are identified. IMPRESSION: No evidence of bowel obstruction. No cause for bowel a distension identified on this study. Electronically Signed   By: Dorise Bullion III M.D   On: 05/06/2020 11:10   CT HEAD WO CONTRAST  Result Date: 05/23/2020 CLINICAL DATA:  Altered mental status, unclear cause. EXAM: CT HEAD WITHOUT CONTRAST TECHNIQUE: Contiguous axial images were obtained from the base of the skull through the vertex without intravenous contrast. COMPARISON:  CT venogram head 05/20/2020, brain MRI 05/15/2020, head CT 05/14/2020. FINDINGS: Brain: Cerebral volume is normal for age. Redemonstrated subtle cortical hyperdensity within the right parietal lobe and small focus of hypodensity within the right cerebellum at sites of known subacute venous infarcts. No interval infarct is identified. No evidence of acute intracranial hemorrhage. No extra-axial fluid collection. No evidence of intracranial mass. No midline shift. Vascular: No hyperdense vessel. Venous thrombus within the torcula and left transverse sinus was better appreciated on prior CT venogram head 05/20/2020. Skull: Normal. Negative for  fracture or focal lesion. Sinuses/Orbits: Visualized orbits show no acute finding. No significant paranasal sinus disease or mastoid effusion at the imaged levels. 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 No CT evidence of interval intracranial abnormality. Electronically Signed   By: Kellie Simmering DO   On: 05/23/2020 15:36   CT HEAD WO CONTRAST  Result Date: 05/14/2020  CLINICAL DATA:  34 year old male with encephalopathy found to have dural venous sinus thrombosis and posterior circulation infarcts and/or postictal changes on recent MRI and CT venogram. ETOH abuse, initially presenting with DTs. EXAM: CT HEAD WITHOUT CONTRAST TECHNIQUE: Contiguous axial images were obtained from the base of the skull through the vertex without intravenous contrast. COMPARISON:  CTP and head MRI 05/12/2020. head CT 05/03/2020. FINDINGS: Brain: Mild hypodensity in the posterior right cerebellum corresponding to the diffusion abnormality compatible with small infarct. No hemorrhage or mass effect (series 4, image 54). There is also subtle hypodensity in the posterior right parietal lobe corresponding to some of the other abnormal diffusion along the posterior cerebral hemispheres (series 2, image 23). No acute intracranial hemorrhage identified. No midline shift, mass effect, or evidence of intracranial mass lesion. No ventriculomegaly. Generalized cerebral volume loss again noted. No other abnormal gray-white matter differentiation. Vascular: No suspicious intracranial vascular hyperdensity. Patency of the torcula and left transverse sinus not evaluated in the absence of contrast. Skull: No acute osseous abnormality identified. Sinuses/Orbits: Visualized paranasal sinuses and mastoids are stable and well pneumatized. Other: Visualized orbits and scalp soft tissues are within normal limits. IMPRESSION: 1. Expected CT appearance of small infarct in the posterior right  cerebellum. And subtle evidence of infarct in the posterior right parietal lobe, making postictal explanation of the DWI abnormality there less likely. 2. No associated hemorrhage or mass effect. 3. Patency of the torcula and left transverse sinus not evaluated in the absence of contrast. 4. No new intracranial abnormality. Electronically Signed   By: Genevie Ann M.D.   On: 05/14/2020 12:45   CT Head Wo Contrast  Result Date: 05/03/2020 CLINICAL DATA:  Seizure, nontraumatic (Age 32-40y) EXAM: CT HEAD WITHOUT CONTRAST TECHNIQUE: Contiguous axial images were obtained from the base of the skull through the vertex without intravenous contrast. COMPARISON:  None. FINDINGS: Brain: No evidence of acute infarction, hemorrhage, hydrocephalus, extra-axial collection or mass lesion/mass effect. Generalized cerebral and cerebellar atrophy which is age advanced. No evidence of pontine hypodensity on CT. Vascular: No hyperdense vessel or unexpected calcification. Skull: No fracture or focal lesion. Sinuses/Orbits: Trace mucosal thickening of the right ethmoid air cell. Paranasal sinuses are otherwise clear. Mastoid air cells are well aerated. The orbits are unremarkable. Other: None. IMPRESSION: 1. No acute intracranial abnormality. 2. Generalized cerebral and cerebellar atrophy, age advanced. Electronically Signed   By: Keith Rake M.D.   On: 05/03/2020 20:26   MR BRAIN WO CONTRAST  Result Date: 05/15/2020 CLINICAL DATA:  Dural venous sinus thrombosis EXAM: MRI HEAD WITHOUT CONTRAST TECHNIQUE: Multiplanar, multiecho pulse sequences of the brain and surrounding structures were obtained without intravenous contrast. COMPARISON:  Brain MRI 05/12/2020 FINDINGS: Brain: There are areas of residual abnormal diffusion restriction over the right posterior parietal lobe and right cerebellum. No new diffusion abnormality. Normal white matter signal. Normal volume of CSF spaces. No chronic microhemorrhage. Vascular: Normal arterial  flow voids. Skull and upper cervical spine: Normal marrow signal Sinuses/Orbits: Negative. Other: None. IMPRESSION: Residual abnormal diffusion restriction over the right posterior parietal lobe and right cerebellum, consistent with expected evolution of subacute venous infarcts. No new diffusion abnormality. Electronically Signed   By: Ulyses Jarred M.D.   On: 05/15/2020 22:55   MR BRAIN WO CONTRAST  Addendum Date: 05/12/2020   ADDENDUM REPORT: 05/12/2020 14:38 ADDENDUM: Study discussed by telephone with Dr. Nicole Kindred on 05/12/2020 at 1432 hours. We discussed that CT Venogram may be the modality of choice for follow-up. Electronically Signed  By: Genevie Ann M.D.   On: 05/12/2020 14:38   Result Date: 05/12/2020 CLINICAL DATA:  34 year old male with encephalopathy, lethargy. History of ETOH abuse and abruptly stopped drinking the day before presentation (6/24) and became progressively confused. Was brought to the ED by his wife and while in the ED was witnessed to have a seizure EXAM: MRI HEAD WITHOUT CONTRAST TECHNIQUE: Multiplanar, multiecho pulse sequences of the brain and surrounding structures were obtained without intravenous contrast. COMPARISON:  Head CT 05/03/2020. FINDINGS: The examination had to be discontinued prior to completion by patient request. Only axial FLAIR imaging was not obtained, although the coronal T2 imaging is nondiagnostic due to motion. Brain: There is a 2 cm area of restricted diffusion in the posterior right cerebellum on series 2, image 14 with patchy T2 hyperintensity. No associated hemorrhage or mass effect. No evidence of this on the comparison CT. Furthermore there is gyriform restricted diffusion in the right parietal lobe on series 2, image 31, and a punctate area of cortical restricted diffusion in the left parietal lobe on image 33. There might also be trace restricted diffusion in the medial left occipital pole. No evidence of associated hemorrhage or intracranial mass  effect. Superimposed severely age advanced volume loss of the brainstem and cerebellum (series 6, image 11). No ventriculomegaly. No extra-axial collection. Negative pituitary. Negative cervicomedullary junction. Vascular: Major intracranial arterial flow voids appear grossly preserved. However, there is an abnormal appearance of the torcula and left transverse sinus on DWI imaging, with a slightly heterogeneous appearance of the sinuses on T2. Skull and upper cervical spine: Negative visible cervical spine and bone marrow signal. Sinuses/Orbits: Negative orbits. Trace paranasal sinus mucosal thickening. Other: Mastoid air cells are clear. Visible internal auditory structures appear normal. IMPRESSION: 1. Mildly motion degraded study which had to be discontinued prior to completion. 2. 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 Head MRV without and with contrast would best evaluate further. Alternatively, CT Venogram (CT head with contrast using venogram protocol) could be pursued if the patient is unable to cooperate with MRI. 3. Associated abnormal diffusion in the parietal lobes, right cerebellum, and possibly also the left occipital pole which may reflect a combination of venous infarcts and postictal changes. 4. No associated hemorrhage or mass effect. 5. Underlying severely age advanced volume loss of the brainstem and cerebellum Electronically Signed: By: Genevie Ann M.D. On: 05/12/2020 14:29   MR BRAIN W WO CONTRAST  Result Date: 05/24/2020 CLINICAL DATA:  Encephalopathy suspect Wernicke's . History of cerebral infarct and dural venous thrombosis. EXAM: MRI HEAD WITHOUT AND WITH CONTRAST TECHNIQUE: Multiplanar, multiecho pulse sequences of the brain and surrounding structures were obtained without and with intravenous contrast. CONTRAST:  89m GADAVIST GADOBUTROL 1 MMOL/ML IV SOLN COMPARISON:  MRI head 05/15/2020, 05/12/2020 FINDINGS: Brain: Motion  degraded study with multiple repeat attempts. Negative for acute infarct. Recent infarcts in the right cerebellum and right parietal lobe and left parietal lobe have resolved. No parenchymal hemorrhage. Clot is present in the region of the torcular. No mass lesion or hydrocephalus. 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 Postcontrast images degraded by extensive motion. Allowing for this, no abnormal enhancement identified. There is filling defect in the left transverse sinus and torcula compatible with residual thrombus. Vascular: Filling defect in the torcula and left transverse sinus compatible with thrombus as noted previously. Normal arterial flow voids. Skull and upper cervical spine: No focal skeletal lesion. Sinuses/Orbits: Paranasal sinuses clear.  Negative  orbit Other: Motion degraded study IMPRESSION: Negative for acute infarct. Resolving infarcts in the right cerebellum and parietal lobes bilaterally Persistent clot in the torcula and left transverse sinus similar to that seen on prior studies. Image quality degraded by motion. Electronically Signed   By: Franchot Gallo M.D.   On: 05/24/2020 18:58   CT ABDOMEN PELVIS W CONTRAST  Result Date: 05/04/2020 CLINICAL DATA:  Abdominal distension; history of severe alcohol use disorder/dependence EXAM: CT ABDOMEN AND PELVIS WITH CONTRAST TECHNIQUE: Multidetector CT imaging of the abdomen and pelvis was performed using the standard protocol following bolus administration of intravenous contrast. Sagittal and coronal MPR images reconstructed from axial data set. CONTRAST:  138m OMNIPAQUE IOHEXOL 300 MG/ML SOLN IV. Dilute oral contrast. COMPARISON:  None FINDINGS: Lower chest: Small BILATERAL pleural effusions and minimal bibasilar atelectasis Hepatobiliary: Gallbladder unremarkable. Mild fatty infiltration of liver. Pancreas: Poorly defined low-attenuation focus at pancreatic body 14 x 12 x 12 mm image 31. Large cystic lesion at pancreatic head/uncinate process 4.0 x  4.4 x 3.8 cm, containing a single septation. Additional low-attenuation focus at pancreatic body 10 x 9 x 11 mm. Spleen: Minimal peripancreatic infiltration. Findings could represent pancreatitis involving the head and body of the pancreas, without significant edema adjacent to pancreatic tail. Adrenals/Urinary Tract: Normal appearance Stomach/Bowel: Adrenal glands, kidneys, ureters, and bladder normal appearance Vascular/Lymphatic: Probable normal appendix. Small hiatal hernia. Retained contrast in fluid in esophagus. Stomach decompressed. Prominent small bowel loops with mild wall edema identified in the mid abdomen and pelvis, nonspecific. No evidence of obstruction or perforation. Probable mild rectal wall thickening and wall hypervascularity. Reproductive: Aorta normal caliber. Vascular structures patent. Scattered normal size mesenteric and retroperitoneal lymph nodes without definite adenopathy. Other: Umbilical hernia containing fat. Moderate ascites. No free air. BILATERAL inguinal hernias containing fat. Musculoskeletal: Unremarkable IMPRESSION: Edema adjacent to head and body of pancreas question pancreatitis; recommend correlation with serum lipase/amylase levels. Cystic lesions at the pancreas, largest at head 4.4 cm diameter, could represent pseudocysts in a patient with pancreatitis or cystic pancreatic neoplasms; recommend attention on follow-up imaging to ensure resolution, or may characterize by MR. Moderate ascites with mild fatty infiltration of liver. Small BILATERAL pleural effusions and minimal bibasilar atelectasis. Small hiatal hernia. Umbilical and BILATERAL inguinal hernias. Prominent small bowel loops with mild wall edema in the mid abdomen and pelvis, nonspecific, could be related to enteritis or ascites. Question mild rectal wall thickening and hypervascularity cannot exclude proctitis. Aortic Atherosclerosis (ICD10-I70.0). Electronically Signed   By: MLavonia DanaM.D.   On: 05/04/2020  15:50   [place]domen Limited  Result Date: 05/10/2020 INDICATION: Ascites. EXAM: ULTRASOUND GUIDED PARACENTESIS MEDICATIONS: None. COMPLICATIONS: None immediate. PROCEDURE: See below. FINDINGS: Only a small amount of ascites noted. Paracentesis was not performed. Follow-up exam can be obtained. IMPRESSION: Paracentesis not performed due to small amount of fluid. Electronically Signed   By: TMarcello Moores Register   On: 05/10/2020 11:27   [place]acentesis  Result Date: 05/23/2020 INDICATION: History of alcoholic cirrhosis with recurrent symptomatic ascites and concern for hemoperitoneum. Please perform ultrasound-guided paracentesis for diagnostic and therapeutic purposes. EXAM: ULTRASOUND-GUIDED PARACENTESIS COMPARISON:  Ultrasound-guided paracentesis-07/07/202 (yielding 3 L of peritoneal fluid) 1; CT abdomen pelvis-05/22/2020 MEDICATIONS: None. COMPLICATIONS: None immediate. TECHNIQUE: Informed written consent was obtained from the patient after a discussion of the risks, benefits and alternatives to treatment. A timeout was performed prior to the initiation of the procedure. Initial ultrasound scanning demonstrates a small amount of ascites within the right lower abdomen which was  subsequently prepped and draped in the usual sterile fashion. 1% lidocaine with epinephrine was used for local anesthesia. Under direct ultrasound guidance, a 19 gauge, 7-cm, Yueh catheter was introduced. An ultrasound image was saved for documentation purposed. The paracentesis was performed. Unfortunately, the access catheter was inadvertently removed prior to completing the paracentesis however given patient's anxiety during the initial catheter placement the decision was made NOT to replace the catheter at this time and the procedure was terminated. A dressing was applied. The patient tolerated the procedure well without immediate post procedural complication. FINDINGS: A total of approximately 600 cc of serous, non bloody fluid was  removed. Samples were sent to the laboratory as requested by the clinical team. IMPRESSION: Successful ultrasound-guided paracentesis yielding 600 cc of serous, non bloody peritoneal fluid. Electronically Signed   By: Sandi Mariscal M.D.   On: 05/23/2020 15:28   US Paracentesis  Result Date: 05/16/2020 INDICATION: History of alcoholic cirrhosis, now with symptomatic ascites. Please perform ultrasound-guided paracentesis for therapeutic purposes. EXAM: ULTRASOUND-GUIDED PARACENTESIS COMPARISON:  CT abdomen pelvis-05/16/2020; ultrasound-guided paracentesis performed 05/07/2020 (yielding 2.8 L of peritoneal fluid) MEDICATIONS: None. COMPLICATIONS: None immediate. TECHNIQUE: Informed written consent was obtained from the patient after a discussion of the risks, benefits and alternatives to treatment. A timeout was performed prior to the initiation of the procedure. Initial ultrasound scanning demonstrates a moderate amount of ascites within the right lower abdomen which was subsequently prepped and draped in the usual sterile fashion. 1% lidocaine with epinephrine was used for local anesthesia. Under direct ultrasound guidance, a 19 gauge, 7-cm, Yueh catheter was introduced. An ultrasound image was saved for documentation purposed. The paracentesis was performed. The catheter was removed and a dressing was applied. The patient tolerated the procedure well without immediate post procedural complication. FINDINGS: A total of approximately 3 liters of dark serous ascitic fluid was removed. IMPRESSION: Successful ultrasound-guided paracentesis yielding 3 liters of peritoneal fluid. Electronically Signed   By: Sandi Mariscal M.D.   On: 05/16/2020 15:55   US Paracentesis  Result Date: 05/07/2020 INDICATION: Patient with history of abdominal pain, pancreatitis, abdominal distention, ascites. Request made for diagnostic and therapeutic paracentesis. EXAM: ULTRASOUND GUIDED DIAGNOSTIC AND THERAPEUTIC PARACENTESIS MEDICATIONS:  10 mL 1% lidocaine COMPLICATIONS: None immediate. PROCEDURE: Informed written consent was obtained from the patient after a discussion of the risks, benefits and alternatives to treatment. A timeout was performed prior to the initiation of the procedure. Initial ultrasound scanning demonstrates a small amount of ascites within the right lower abdominal quadrant. The right lower abdomen was prepped and draped in the usual sterile fashion. 1% lidocaine was used for local anesthesia. Following this, a 19 gauge, 7-cm, Yueh catheter was introduced. An ultrasound image was saved for documentation purposes. The paracentesis was performed. The catheter was removed and a dressing was applied. The patient tolerated the procedure well without immediate post procedural complication. FINDINGS: A total of approximately 2.8 liters of yellow fluid was removed. Samples were sent to the laboratory as requested by the clinical team. IMPRESSION: Successful ultrasound-guided paracentesis yielding 2.8 liters of peritoneal fluid. Read by: Brynda Greathouse PA-C Electronically Signed   By: Sandi Mariscal M.D.   On: 05/07/2020 12:35   DG Chest Port 1 View  Result Date: 05/22/2020 CLINICAL DATA:  Chest pain. EXAM: PORTABLE CHEST 1 VIEW COMPARISON:  May 15, 2020. FINDINGS: The heart size and mediastinal contours are within normal limits. Right-sided PICC line is noted with distal tip in expected position of the SVC. No pneumothorax is  noted. Mild to moderate left pleural effusion is noted with increased left basilar atelectasis or infiltrate. Minimal right pleural effusion is noted with minimal right basilar subsegmental atelectasis. Mild central pulmonary vascular congestion is noted. The visualized skeletal structures are unremarkable. IMPRESSION: Mild to moderate left pleural effusion is noted with increased left basilar atelectasis or infiltrate. Minimal right pleural effusion is noted with minimal right basilar subsegmental atelectasis.  Mild central pulmonary vascular congestion is noted. Electronically Signed   By: Marijo Conception M.D.   On: 05/22/2020 08:57   DG Chest Port 1 View  Result Date: 05/15/2020 CLINICAL DATA:  Status post PICC line placement EXAM: PORTABLE CHEST 1 VIEW COMPARISON:  05/03/2020 FINDINGS: Cardiac shadow is stable. Increasing right-sided pleural effusion and likely underlying atelectasis is present. New right-sided PICC line is noted with the tip in the mid right atrium. This could be withdrawn as much as 3 cm as necessary. Right lung is clear. Small right effusion is noted. No bony abnormality is seen. IMPRESSION: Bilateral pleural effusions left greater than right. Associated left basilar atelectasis/infiltrate is present. PICC line as described in the mid right atrium. This could be withdrawn as many as 3 cm as clinically indicated. Electronically Signed   By: Inez Catalina M.D.   On: 05/15/2020 19:27   DG Chest Portable 1 View  Result Date: 05/03/2020 CLINICAL DATA:  Altered mental status. EXAM: PORTABLE CHEST 1 VIEW COMPARISON:  None FINDINGS: The heart size and mediastinal contours are within normal limits. Both lungs are clear. The visualized skeletal structures are unremarkable. IMPRESSION: Normal exam. Electronically Signed   By: Lorriane Shire M.D.   On: 05/03/2020 20:06   MR ABDOMEN MRCP W WO CONTAST  Result Date: 05/12/2020 CLINICAL DATA:  Pancreatic cyst/pseudocyst, altered mental status, chronic alcohol abuse, prior abnormal CT EXAM: MRI ABDOMEN WITHOUT AND WITH CONTRAST (INCLUDING MRCP) TECHNIQUE: Multiplanar multisequence MR imaging of the abdomen was performed both before and after the administration of intravenous contrast. Heavily T2-weighted images of the biliary and pancreatic ducts were obtained, and three-dimensional MRCP images were rendered by post processing. CONTRAST:  63m GADAVIST GADOBUTROL 1 MMOL/ML IV SOLN COMPARISON:  CT abdomen pelvis, 05/04/2020 FINDINGS: Lower chest: Moderate  bilateral pleural effusions. Hepatobiliary: No mass or other parenchymal abnormality identified on noncontrast imaging. No obvious, overt morphologic stigmata of cirrhosis. Gallbladder wall thickening, nonspecific in the setting of ascites. Pancreas: Multiple redemonstrated fluid signal lesions within the pancreatic head and neck, the largest in the pancreatic head adjacent to the distal pancreatic duct measuring 3.8 x 3.3 cm (series 4, image 27). There is no pancreatic ductal dilatation. There may be some adjacent peripancreatic inflammatory stranding, difficult to discern in the setting of large volume ascites. Spleen:  Within normal limits in size and appearance. Adrenals/Urinary Tract: No masses identified. No evidence of hydronephrosis. Stomach/Bowel: Visualized portions within the abdomen are unremarkable. Vascular/Lymphatic: No pathologically enlarged lymph nodes identified. No abdominal aortic aneurysm demonstrated. Other:  Large volume ascites throughout the abdomen. Musculoskeletal: No suspicious bone lesions identified. IMPRESSION: 1. Multiple redemonstrated fluid signal lesions within the pancreatic head and neck, the largest in the pancreatic head adjacent to the distal pancreatic duct measuring 3.8 cm. There may be some adjacent peripancreatic inflammatory stranding, difficult to discern in the setting of large volume ascites. 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 No pancreatic ductal dilatation. 2. No obvious, overt morphologic stigmata of cirrhosis. No evidence of hepatic mass or other parenchymal abnormality on noncontrast imaging.  3. Large volume ascites throughout the abdomen. 4. Moderate bilateral pleural effusions. Electronically Signed   By: Eddie Candle M.D.   On: 05/12/2020 14:58   ECHOCARDIOGRAM COMPLETE  Result Date: 05/17/2020    ECHOCARDIOGRAM REPORT   Patient Name:   Shane Weyenberg Sr. Date of Exam: 05/16/2020 Medical Rec #:  [id number]        Height:       67.0 in Accession #:    [id number]      Weight:       150.6 lb Date of Birth:  09-23-1986        BSA:          1.792 m Patient Age:    34 years        BP:           94/63 mmHg Patient Gender: M               HR:           91 bpm. Exam Location:  [facility] Procedure: 2D Echo, Cardiac Doppler and Color Doppler Indications:     Shock  History:         Patient has no prior history of Echocardiogram examinations.                  Risk Factors:Alochol Abuse.  Sonographer:     Wilford Sports Rodgers-Borowski Referring Phys:  [id number] Bradly Bienenstock Diagnosing Phys: Ida Rogue MD IMPRESSIONS  1. Left ventricular ejection fraction, by estimation, is 60 to 65%. The left ventricle has normal function. The left ventricle has no regional wall motion abnormalities. Left ventricular diastolic parameters were normal.  2. Right ventricular systolic function is normal. The right ventricular size is normal.  3. The mitral valve is normal in structure. No evidence of mitral valve regurgitation. No evidence of mitral stenosis.  4. The aortic valve is normal in structure. Aortic valve regurgitation is not visualized. No aortic stenosis is present.  5. The inferior vena cava is normal in size with greater than 50% respiratory variability, suggesting right atrial pressure of 3 mmHg. FINDINGS  Left Ventricle: Left ventricular ejection fraction, by estimation, is 60 to 65%. The left ventricle has normal function. The left ventricle has no regional wall motion abnormalities. The left ventricular internal cavity size was normal in size. There is  no left ventricular hypertrophy. Left ventricular diastolic parameters were normal. Right Ventricle: The right ventricular size is normal. No increase in right ventricular wall thickness. Right ventricular systolic function is normal. Left Atrium: Left atrial size was normal in size. Right Atrium: Right atrial size was normal in size. Pericardium: There is no evidence of pericardial effusion.  Mitral Valve: The mitral valve is normal in structure. Normal mobility of the mitral valve leaflets. No evidence of mitral valve regurgitation. No evidence of mitral valve stenosis. Tricuspid Valve: The tricuspid valve is normal in structure. Tricuspid valve regurgitation is not demonstrated. No evidence of tricuspid stenosis. Aortic Valve: The aortic valve is normal in structure. Aortic valve regurgitation is not visualized. No aortic stenosis is present. Pulmonic Valve: The pulmonic valve was normal in structure. Pulmonic valve regurgitation is not visualized. No evidence of pulmonic stenosis. Aorta: The aortic root is normal in size and structure. Venous: The inferior vena cava is normal in size with greater than 50% respiratory variability, suggesting right atrial pressure of 3 mmHg. IAS/Shunts: No atrial level shunt detected by color flow Doppler.  LEFT VENTRICLE PLAX 2D LVIDd:  4.34 cm  Diastology LVIDs:         2.77 cm  LV e' lateral:   10.30 cm/s LV PW:         0.97 cm  LV E/e' lateral: 10.0 LV IVS:        0.87 cm  LV e' medial:    9.79 cm/s LVOT diam:     2.10 cm  LV E/e' medial:  10.5 LV SV:         59 LV SV Index:   33 LVOT Area:     3.46 cm  RIGHT VENTRICLE             IVC RV Basal diam:  3.44 cm     IVC diam: 1.92 cm RV S prime:     16.90 cm/s TAPSE (M-mode): 2.5 cm LEFT ATRIUM             Index       RIGHT ATRIUM          Index LA diam:        3.60 cm 2.01 cm/m  RA Area:     9.62 cm LA Vol (A2C):   40.9 ml 22.82 ml/m RA Volume:   21.10 ml 11.77 ml/m LA Vol (A4C):   29.0 ml 16.18 ml/m LA Biplane Vol: 34.8 ml 19.42 ml/m  AORTIC VALVE LVOT Vmax:   88.60 cm/s LVOT Vmean:  61.900 cm/s LVOT VTI:    0.169 m  AORTA Ao Root diam: 3.50 cm MITRAL VALVE MV Area (PHT): 2.39 cm     SHUNTS MV Decel Time: 317 msec     Systemic VTI:  0.17 m MV E velocity: 103.00 cm/s  Systemic Diam: 2.10 cm MV A velocity: 107.00 cm/s MV E/A ratio:  0.96 Ida Rogue MD Electronically signed by Ida Rogue MD  Signature Date/Time: 05/17/2020/7:45:35 AM    Final    CT VENOGRAM HEAD  Result Date: 05/20/2020 CLINICAL DATA:  Follow-up dural venous thrombosis. Subacute infarctions right posterior parietal lobe and right cerebellum. EXAM: CT VENOGRAM HEAD TECHNIQUE: CT venography performed and compared with previous studies beginning 05/03/2020. CONTRAST:  44m OMNIPAQUE IOHEXOL 350 MG/ML SOLN COMPARISON:  Multiple previous studies beginning 05/03/2020. FINDINGS: Subtle low density in the right cerebellum at the site recent acute infarction. Subtle loss of gray-white differentiation in the right parietal region consistent with the area of acute cortical infarction. No swelling or hemorrhage. No mass, hydrocephalus or extra-axial collection. Superior sagittal sinus remains patent to the torcula where there is residual intraluminal thrombus as seen previously. Thrombosis of the left transverse sinus with a small amount of peripheral flow as seen previously. Sigmoid sinus and jugular vein on the left appear normal. Right transverse sinus, sigmoid sinus and jugular vein are patent. IMPRESSION: Similar appearance with intraluminal thrombus in the torcula and left transverse sinus. There is a small amount of peripheral flow around thrombus. Subtle low density in the right cerebellum and at the gray-white junction of the right parietal lobe consistent with the areas acute infarction shown by MRI. Electronically Signed   By: MNelson ChimesM.D.   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 EXAM: CT VENOGRAM HEAD TECHNIQUE: Postcontrast CT venography of the head was performed following the administration of 75 mL Omnipaque 350 intravenous contrast. Coronal and sagittal reconstructions were submitted for evaluation. Axial, coronal and sagittal MIP reconstructions were also submitted. CONTRAST:  768mOMNIPAQUE IOHEXOL 350 MG/ML  SOLN COMPARISON:  Brain MRI  05/12/2020. 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 Of note, additional small foci of diffusion weighted abnormality were demonstrated within the parietal lobes on brain MRI performed earlier the same day. There is no acute intracranial hemorrhage. No extra-axial fluid collection. No evidence of intracranial mass. No midline shift. There is a filling defect within the confluence of sinuses and extending into the left transverse dural venous sinus compatible with dural venous sinus thrombosis. The superior sagittal sinus, internal cerebral veins, vein of Galen, straight sinus, right transverse sinus, sigmoid sinuses and visualized jugular veins are patent. No calvarial fracture or suspicious osseous lesion. Visualized orbits show no acute finding. No significant paranasal sinus disease or mastoid effusion at the imaged levels. These results will be called to the ordering clinician or representative by the Radiologist Assistant, and communication documented in the PACS or [employer]. 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 No evidence of hemorrhagic conversion. Of note, small foci of restricted diffusion were demonstrated within the bilateral parietal lobes on same-day brain MRI. Please refer to this prior examination for further description. Electronically Signed   By: Kellie Simmering DO   On: 05/12/2020 17:38   [place] EKG SITE RITE  Result Date: 05/15/2020 If Site Rite image not attached, placement could not be confirmed due to current cardiac rhythm.    Subjective: Patient has no new complaint today.  He wants to go home.  He was eating and drinking well.  Wife was at bedside.  We had another discussion regarding alcohol use and he promised me that he will never touch alcohol again.  Discharge  Exam: Vitals:   05/31/20 0759 05/31/20 1226  BP: 139/87 119/80  Pulse: 82 73  Resp: 18 21  Temp: 98 F (36.7 C) 98.2 F (36.8 C)  SpO2: 100% 100%   Vitals:   05/31/20 0457 05/31/20 0500 05/31/20 0759 05/31/20 1226  BP: (!) 138/98  139/87 119/80  Pulse: 91  82 73  Resp: '16  18 21  ' Temp: 98.6 F (37 C)  98 F (36.7 C) 98.2 F (36.8 C)  TempSrc: Oral  Oral Oral  SpO2: 98%  100% 100%  Weight:  59.8 kg    Height:        General: Pt is alert, awake, not in acute distress Cardiovascular: RRR, S1/S2 +, no rubs, no gallops Respiratory: CTA bilaterally, no wheezing, no rhonchi Abdominal: Soft, NT, ND, bowel sounds + Extremities: no edema, no cyanosis   The results of significant diagnostics from this hospitalization (including imaging, microbiology, ancillary and laboratory) are listed below for reference.    Microbiology: Recent Results (from the past 240 hour(s))  Peritoneal Fluid culture ( includes gram stain)     Status: None   Collection Time: 05/23/20 11:41 AM   Specimen: Peritoneal Washings; Peritoneal Fluid  Result Value Ref Range Status   Specimen Description   Final    PERITONEAL Performed at [facility], [address] [place], [place] [address]    Special Requests   Final    Normal Performed at [facility], [address] [place], [place] [address]    Gram Stain   Final    WBC PRESENT,BOTH PMN AND MONONUCLEAR NO ORGANISMS SEEN CYTOSPIN SMEAR    Culture   Final    NO GROWTH 3 DAYS Performed at  [facility], [place] [address] [place], [place] [address]    Report Status 05/27/2020 FINAL  Final  Body fluid culture     Status: None   Collection Time: 05/23/20  1:29 PM   Specimen: PATH Cytology Peritoneal fluid  Result Value Ref Range Status   Specimen Description   Final    PERITONEAL Performed at [facility], [address] [place], [place] [address]    Special Requests   Final    NONE Performed at [facility], [address] [place], [place] [address]    Gram Stain   Final    WBC PRESENT,BOTH PMN AND MONONUCLEAR NO ORGANISMS SEEN CYTOSPIN SMEAR    Culture   Final    NO GROWTH 3 DAYS Performed at [facility], [place] [address] [place], [place] [address]    Report Status 05/27/2020 FINAL  Final  Fungus Culture With Stain     Status: None (Preliminary result)   Collection Time: 05/23/20  1:29 PM   Specimen: PATH Cytology Peritoneal fluid  Result Value Ref Range Status   Fungus Stain Final report  Final    Comment: (NOTE) Performed At: [facility] [place], [place] [id number] Rush Farmer MD [id number]    Fungus (Mycology) Culture PENDING  Incomplete   Fungal Source PERITONEAL  Final    Comment: Performed at [facility], [address], [place], [place] [address]  Aerobic Culture (superficial specimen)     Status: None   Collection Time: 05/23/20  1:29 PM   Specimen: PATH Cytology Peritoneal fluid  Result Value Ref Range Status   Specimen Description   Final    PERITONEAL Performed at [facility], [address] [place], [place] [address]    Special Requests   Final    NONE Performed at [facility], [address] [place], [place] [address]    Gram Stain   Final    RARE WBC PRESENT, PREDOMINANTLY MONONUCLEAR NO ORGANISMS SEEN    Culture   Final    NO GROWTH 3 DAYS Performed at [facility], [place] [address] [place], [place] [address]    Report Status 05/26/2020 FINAL  Final  Fungus Culture Result     Status: None   Collection Time: 05/23/20  1:29 PM  Result Value Ref Range Status   Result 1 Comment  Final    Comment: (NOTE) KOH/Calcofluor preparation:  no fungus observed. Performed At: [facility] [place], [place] [id number] Rush Farmer MD [id number]      Labs: BNP (last 3 results) No results for input(s): BNP in the last 8760 hours. Basic  Metabolic Panel: Recent Labs  Lab 05/25/20 0312 05/25/20 0312 05/26/20 0514 05/26/20 0514 05/27/20 0650 05/28/20 0416 05/29/20 0425 05/30/20 0433 05/31/20 0413  NA 134*  --  135  --  135 134*  --   --   --   K 3.9  --  3.9  --  3.7 3.8  --   --   --   CL 97*  --  95*  --  98 101  --   --   --   CO2 28  --  28  --  26 28  --   --   --   GLUCOSE 86  --  93  --  98 108*  --   --   --   BUN 19  --  16  --  10 12  --   --   --   CREATININE 0.60*  --  0.63  --  0.51* 0.49*  --   --   --   CALCIUM 8.3*  --  8.8*  --  8.6* 8.6*  --   --   --   MG 1.6*   < > 1.9   < > 1.6* 1.9 1.7 1.9 1.8  PHOS 5.0*  --   --   --  5.4*  --   --   --   --    < > = values in this interval not displayed.   Liver Function Tests: Recent Labs  Lab 05/25/20 0312 05/26/20 0514 05/27/20 0650 05/28/20 0416  AST 89* 54* 31 26  ALT 112* 93* 65* 52*  ALKPHOS 138* 123 104 106  BILITOT 0.6 0.4 0.8 0.6  PROT 5.1* 5.4* 5.4* 5.8*  ALBUMIN 1.7* 2.1* 2.2* 2.5*   Recent Labs  Lab 05/26/20 0514  LIPASE 84*   No results for input(s): AMMONIA in the last 168 hours. CBC: Recent Labs  Lab 05/25/20 0312 05/26/20 0514 05/27/20 0650 05/28/20 0416 05/31/20 0413  WBC 10.3 8.5 9.2 9.9 6.4  HGB 8.5* 9.4* 9.4* 9.7* 9.7*  HCT 25.7* 28.8* 27.6* 28.6* 28.7*  MCV 92.4 94.7 92.3 92.0 92.0  PLT 178 226 270 338 374   Cardiac Enzymes: No results for input(s): CKTOTAL, CKMB, CKMBINDEX, TROPONINI in the last 168 hours. BNP: Invalid input(s): POCBNP CBG: Recent Labs  Lab 05/26/20 0112 05/26/20 0542 05/26/20 1119 05/31/20 0801 05/31/20 1021  GLUCAP 95 73 89 62* 133*   D-Dimer No results for input(s): DDIMER in the last 72 hours. Hgb A1c No results for input(s): HGBA1C in the last 72 hours. Lipid Profile No results for input(s): CHOL, HDL, LDLCALC, TRIG, CHOLHDL, LDLDIRECT in the last 72 hours. Thyroid function studies No results for input(s): TSH, T4TOTAL, T3FREE, THYROIDAB in the last 72 hours.  Invalid  input(s): FREET3 Anemia work up No results for input(s): VITAMINB12, FOLATE, FERRITIN, TIBC, IRON, RETICCTPCT in the last 72 hours. Urinalysis    Component Value Date/Time   COLORURINE AMBER (A) 05/16/2020 0327   APPEARANCEUR CLOUDY (A) 05/16/2020 0327   LABSPEC 1.030 05/16/2020 0327   PHURINE 5.0 05/16/2020 0327   GLUCOSEU NEGATIVE 05/16/2020 0327   HGBUR SMALL (A) 05/16/2020 0327   BILIRUBINUR NEGATIVE 05/16/2020 0327   KETONESUR NEGATIVE 05/16/2020 0327   PROTEINUR NEGATIVE 05/16/2020 0327   NITRITE NEGATIVE 05/16/2020 0327   LEUKOCYTESUR NEGATIVE 05/16/2020 0327   Sepsis Labs Invalid input(s): PROCALCITONIN,  WBC,  LACTICIDVEN Microbiology Recent Results (from the past 240 hour(s))  Peritoneal Fluid culture ( includes gram stain)     Status: None   Collection Time: 05/23/20 11:41 AM   Specimen: Peritoneal Washings; Peritoneal Fluid  Result Value Ref Range Status   Specimen Description   Final    PERITONEAL Performed at [facility], [address] [place], [place] [address]    Special Requests   Final    Normal Performed at [facility], [address] [place], [place] [address]    Gram Stain   Final    WBC PRESENT,BOTH PMN AND MONONUCLEAR NO ORGANISMS SEEN CYTOSPIN SMEAR    Culture   Final    NO GROWTH 3 DAYS Performed at [facility], [place] [address] [place], [place] [address]    Report Status 05/27/2020 FINAL  Final  Body fluid culture     Status: None  Collection Time: 05/23/20  1:29 PM   Specimen: PATH Cytology Peritoneal fluid  Result Value Ref Range Status   Specimen Description   Final    PERITONEAL Performed at [facility], [address] [place], [place] [address]    Special Requests   Final    NONE Performed at [facility], [address] [place], [place] [address]    Gram Stain   Final    WBC PRESENT,BOTH PMN AND MONONUCLEAR NO ORGANISMS SEEN CYTOSPIN SMEAR    Culture   Final    NO  GROWTH 3 DAYS Performed at [facility], [place] [address], [place], [place] [address]    Report Status 05/27/2020 FINAL  Final  Fungus Culture With Stain     Status: None (Preliminary result)   Collection Time: 05/23/20  1:29 PM   Specimen: PATH Cytology Peritoneal fluid  Result Value Ref Range Status   Fungus Stain Final report  Final    Comment: (NOTE) Performed At: [facility] [place], [place] [id number] Rush Farmer MD [id number]    Fungus (Mycology) Culture PENDING  Incomplete   Fungal Source PERITONEAL  Final    Comment: Performed at [facility], [address], [place], [place] [address]  Aerobic Culture (superficial specimen)     Status: None   Collection Time: 05/23/20  1:29 PM   Specimen: PATH Cytology Peritoneal fluid  Result Value Ref Range Status   Specimen Description   Final    PERITONEAL Performed at [facility], [address], [place], [place] [address]    Special Requests   Final    NONE Performed at [facility], [address] [place], [place] [address]    Gram Stain   Final    RARE WBC PRESENT, PREDOMINANTLY MONONUCLEAR NO ORGANISMS SEEN    Culture   Final    NO GROWTH 3 DAYS Performed at [facility], [place] [address] [place], [place] [address]    Report Status 05/26/2020 FINAL  Final  Fungus Culture Result     Status: None   Collection Time: 05/23/20  1:29 PM  Result Value Ref Range Status   Result 1 Comment  Final    Comment: (NOTE) KOH/Calcofluor preparation:  no fungus observed. Performed At: [facility] [place], [place] [id number] Rush Farmer MD [id number]     Time coordinating discharge: Over 30 minutes  SIGNED:  Lorella Nimrod, MD  [facility] 05/31/2020, 4:47 PM  If 7PM-7AM, please contact night-coverage [email or web address]  This record has been created using Systems analyst. Errors have been sought and  corrected,but may not always be located. Such creation errors do not reflect on the standard of care.

## 2020-05-31 NOTE — TOC Transition Note (Signed)
Transition of Care [facility]) - CM/SW Discharge Note   Patient Details  Name: Shane Mareno Sr. MRN: [id number] Date of Birth: 12/27/1985  [facility] CM/SW Contact:  Allayne Butcher, RN Phone Number: 05/31/2020, 4:43 PM   Clinical Narrative:    Patient worked well with therapy today and was able to walk 12 ft in the room.  Patient would like to go home today.  Wheelchair and 3 in 1 delivered to the room.  Family will be picking the patient up.  [facility] provide home health services.  Denyse Amass with [facility] is aware of discharge today.   Final next level of care: Home w Home Health Services Barriers to Discharge: Barriers Resolved   Patient Goals and CMS Choice Patient states their goals for this hospitalization and ongoing recovery are:: Patient wants to go home with home health services. CMS Medicare.gov Compare Post Acute Care list provided to:: Patient Choice offered to / list presented to : Patient  Discharge Placement                       Discharge Plan and Services   Discharge Planning Services: CM Consult Post Acute Care Choice: Home Health          DME Arranged: 3-N-1, Lightweight manual wheelchair with seat cushion DME Agency: [employer] Date DME Agency Contacted: 05/31/20 Time DME Agency Contacted: 1504 Representative spoke with at DME Agency: Oletha Cruel HH Arranged: RN, PT, OT, Nurse's Aide HH Agency: [facility] Date [facility] Agency Contacted: 05/31/20 Time HH Agency Contacted: 1504 Representative spoke with at [facility] Agency: Wayne Both  Social Determinants of Health (SDOH) Interventions     Readmission Risk Interventions No flowsheet data found.

## 2020-06-21 LAB — FUNGUS CULTURE RESULT

## 2020-06-21 LAB — FUNGUS CULTURE WITH STAIN

## 2020-06-21 LAB — FUNGAL ORGANISM REFLEX

## 2020-08-07 NOTE — Addendum Note (Signed)
Addended by: Deirdre Evener on: 08/07/2020 03:41 PM   Modules accepted: Level of Service

## 2020-09-12 DIAGNOSIS — G2581 Restless legs syndrome: Secondary | ICD-10-CM | POA: Diagnosis present

## 2020-12-25 ENCOUNTER — Emergency Department
Admission: EM | Admit: 2020-12-25 | Discharge: 2020-12-25 | Disposition: A | Discharge status: Home / Self Care | Payer: Medicaid Other | Attending: Emergency Medicine | Admitting: Emergency Medicine

## 2020-12-25 ENCOUNTER — Other Ambulatory Visit: Payer: Self-pay

## 2020-12-25 ENCOUNTER — Emergency Department: Payer: Medicaid Other

## 2020-12-25 DIAGNOSIS — R1033 Periumbilical pain: Secondary | ICD-10-CM | POA: Diagnosis present

## 2020-12-25 DIAGNOSIS — Z7901 Long term (current) use of anticoagulants: Secondary | ICD-10-CM | POA: Insufficient documentation

## 2020-12-25 DIAGNOSIS — K859 Acute pancreatitis without necrosis or infection, unspecified: Secondary | ICD-10-CM | POA: Insufficient documentation

## 2020-12-25 DIAGNOSIS — F17200 Nicotine dependence, unspecified, uncomplicated: Secondary | ICD-10-CM | POA: Diagnosis not present

## 2020-12-25 DIAGNOSIS — Z79899 Other long term (current) drug therapy: Secondary | ICD-10-CM | POA: Diagnosis not present

## 2020-12-25 LAB — COMPREHENSIVE METABOLIC PANEL
ALT: 15 U/L (ref 0–44)
AST: 17 U/L (ref 15–41)
Albumin: 4.2 g/dL (ref 3.5–5.0)
Alkaline Phosphatase: 72 U/L (ref 38–126)
Anion gap: 10 (ref 5–15)
BUN: 14 mg/dL (ref 6–20)
CO2: 25 mmol/L (ref 22–32)
Calcium: 9.4 mg/dL (ref 8.9–10.3)
Chloride: 101 mmol/L (ref 98–111)
Creatinine, Ser: 0.81 mg/dL (ref 0.61–1.24)
GFR, Estimated: >60 mL/min (ref >60)
Glucose, Bld: 103 mg/dL — ABNORMAL HIGH (ref 70–99)
Potassium: 4.2 mmol/L (ref 3.5–5.1)
Sodium: 136 mmol/L (ref 135–145)
Total Bilirubin: 0.5 mg/dL (ref 0.3–1.2)
Total Protein: 6.9 g/dL (ref 6.5–8.1)

## 2020-12-25 LAB — LIPID PANEL
Cholesterol: 181 mg/dL (ref 0–200)
HDL: 37 mg/dL — ABNORMAL LOW (ref >40)
LDL Cholesterol: 109 mg/dL — ABNORMAL HIGH (ref 0–99)
Total CHOL/HDL Ratio: 4.9 RATIO
Triglycerides: 173 mg/dL — ABNORMAL HIGH (ref <150)
VLDL: 35 mg/dL (ref 0–40)

## 2020-12-25 LAB — CBC
HCT: 41.4 % (ref 39.0–52.0)
Hemoglobin: 14.1 g/dL (ref 13.0–17.0)
MCH: 31.3 pg (ref 26.0–34.0)
MCHC: 34.1 g/dL (ref 30.0–36.0)
MCV: 92 fL (ref 80.0–100.0)
Platelets: 168 10*3/uL (ref 150–400)
RBC: 4.5 MIL/uL (ref 4.22–5.81)
RDW: 12.4 % (ref 11.5–15.5)
WBC: 9.4 10*3/uL (ref 4.0–10.5)
nRBC: 0 % (ref 0.0–0.2)

## 2020-12-25 LAB — LIPASE, BLOOD: Lipase: 242 U/L — ABNORMAL HIGH (ref 11–51)

## 2020-12-25 LAB — URINALYSIS, COMPLETE (UACMP) WITH MICROSCOPIC
Bacteria, UA: NONE SEEN
Bilirubin Urine: NEGATIVE
Glucose, UA: NEGATIVE mg/dL
Hgb urine dipstick: NEGATIVE
Ketones, ur: NEGATIVE mg/dL
Leukocytes,Ua: NEGATIVE
Nitrite: NEGATIVE
Protein, ur: NEGATIVE mg/dL
Specific Gravity, Urine: 1.021 (ref 1.005–1.030)
Squamous Epithelial / HPF: NONE SEEN (ref 0–5)
pH: 7 (ref 5.0–8.0)

## 2020-12-25 IMAGING — CT CT ABD-PELV W/ CM
2 of 4 series · 15 of 46 positions shown, 17 images · IV contrast (APPLIED)
Comparison: CT abdomen pelvis dated [DATE].

CLINICAL DATA: Abdominal pain for a few weeks.

EXAM:
CT ABDOMEN AND PELVIS WITH CONTRAST
TECHNIQUE: Multidetector CT imaging of the abdomen and pelvis was performed
using the standard protocol following bolus administration of
intravenous contrast.
CONTRAST:  100mL OMNIPAQUE IOHEXOL 300 MG/ML  SOLN

[Series 2: routine abd/pel with · axial · 0.67mm/px · z∈[-470,-50]mm · 12 of 96 slices shown, 14 images]
[im 8/96  soft-tissue]
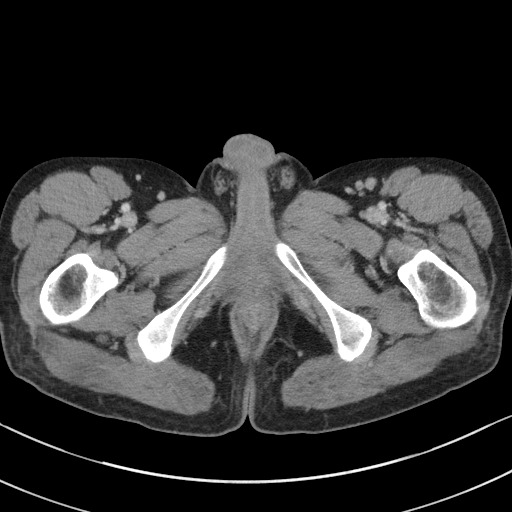
[im 8/96  bone]
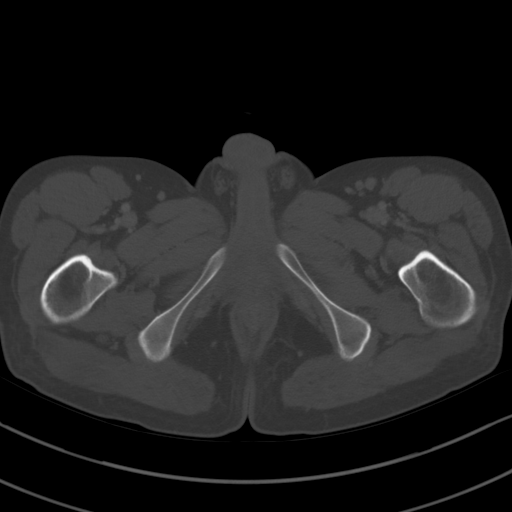
[im 16/96  soft-tissue]
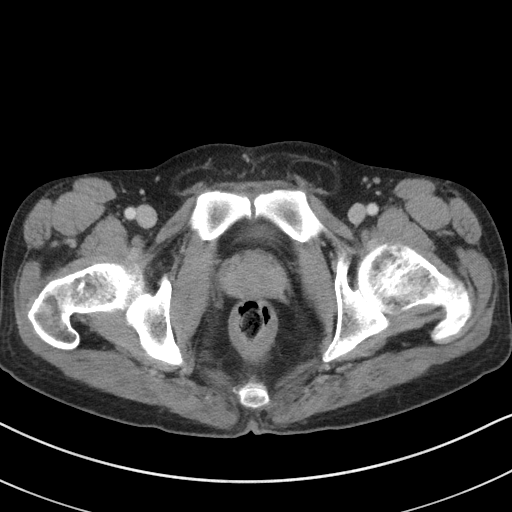
[im 23/96  soft-tissue]
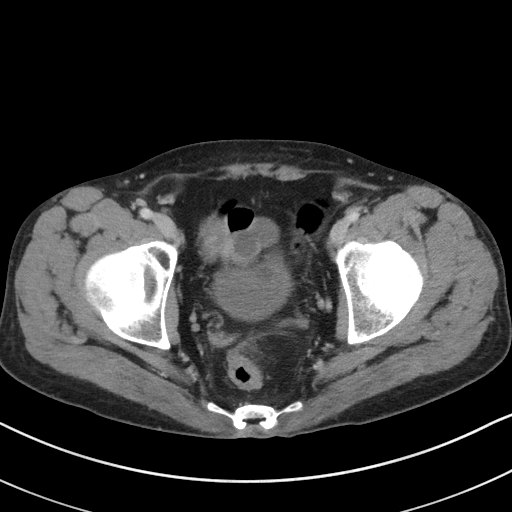
[im 31/96  soft-tissue]
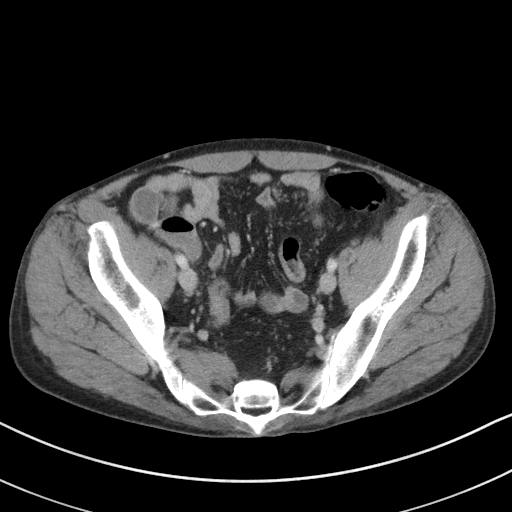
[im 39/96  soft-tissue]
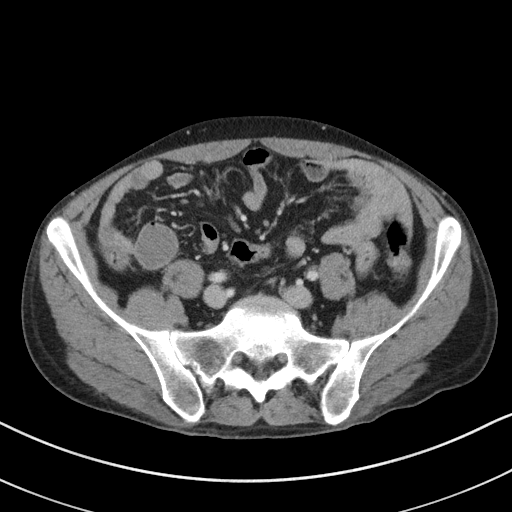
[im 46/96  soft-tissue]
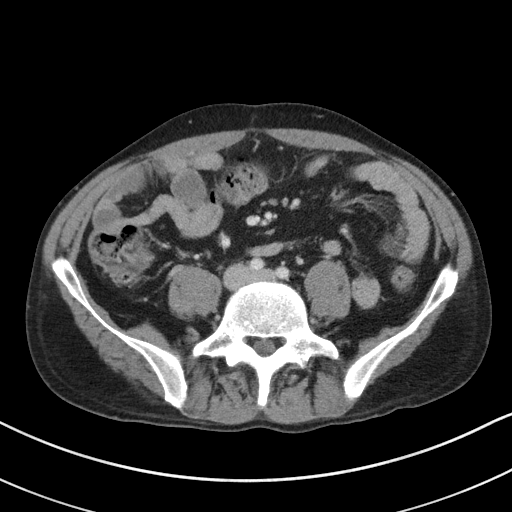
[im 54/96  soft-tissue]
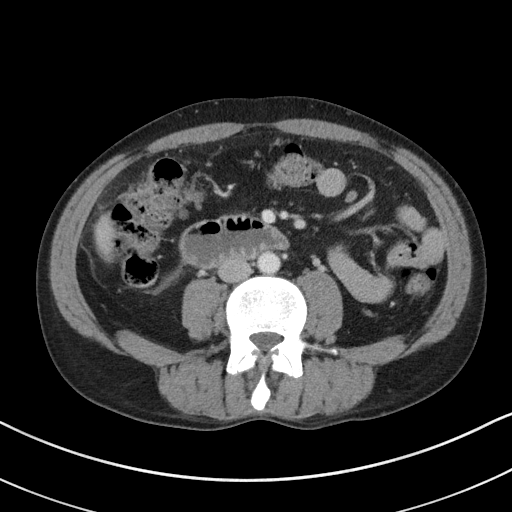
[im 61/96  soft-tissue]
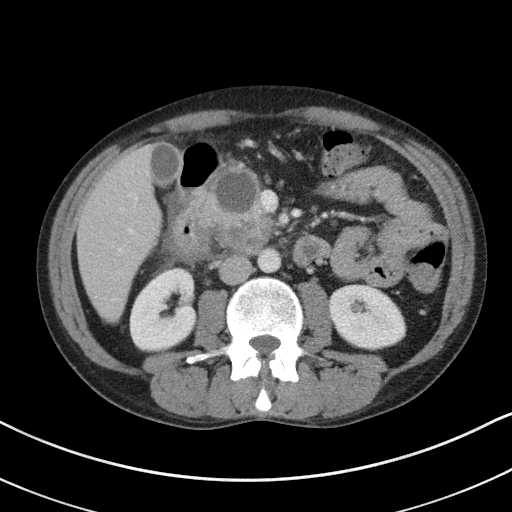
[im 69/96  soft-tissue]
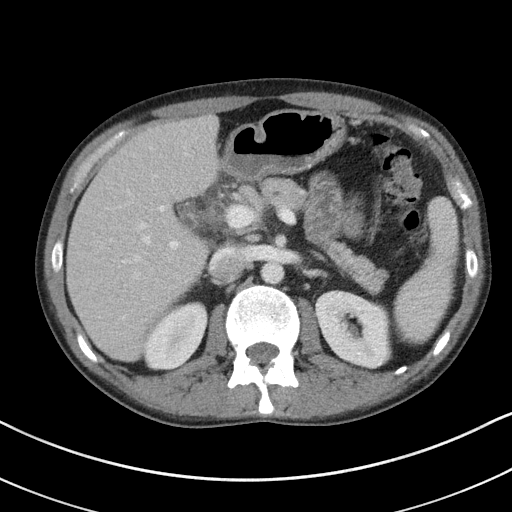
[im 69/96  bone]
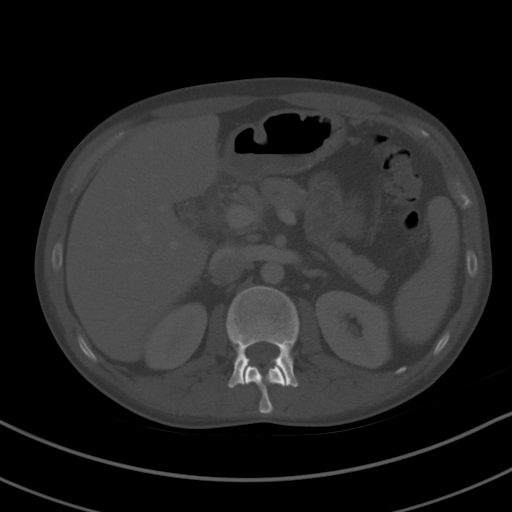
[im 77/96  soft-tissue]
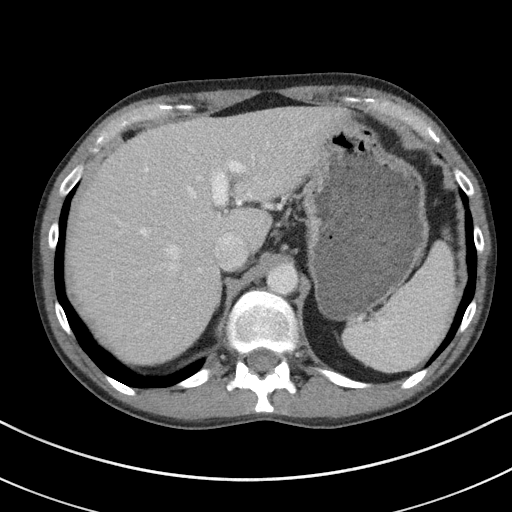
[im 84/96  soft-tissue]
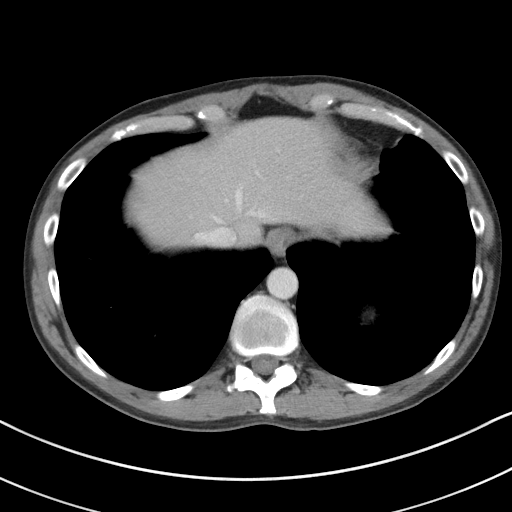
[im 92/96  soft-tissue]
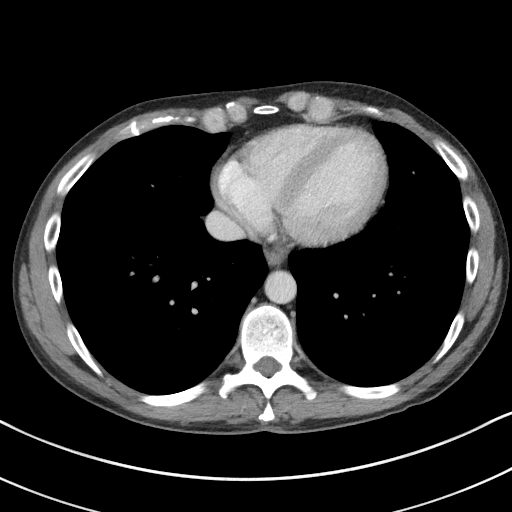

[Series 5: coronal st · coronal · 0.71mm/px · 3 of 79 slices shown]
[im 27/79  soft-tissue]
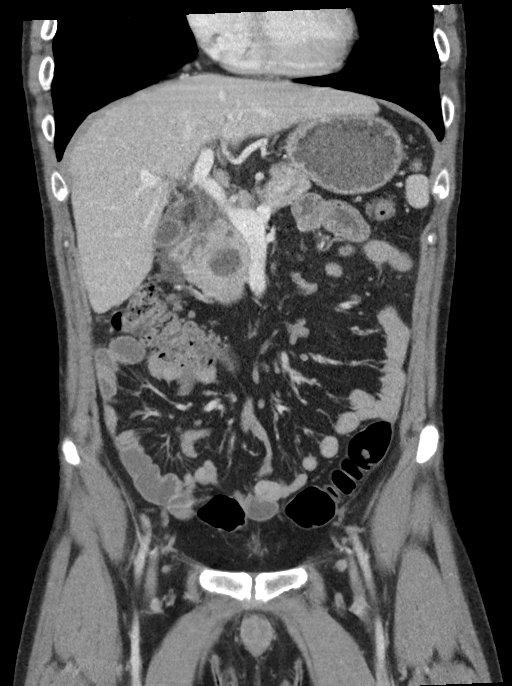
[im 35/79  soft-tissue]
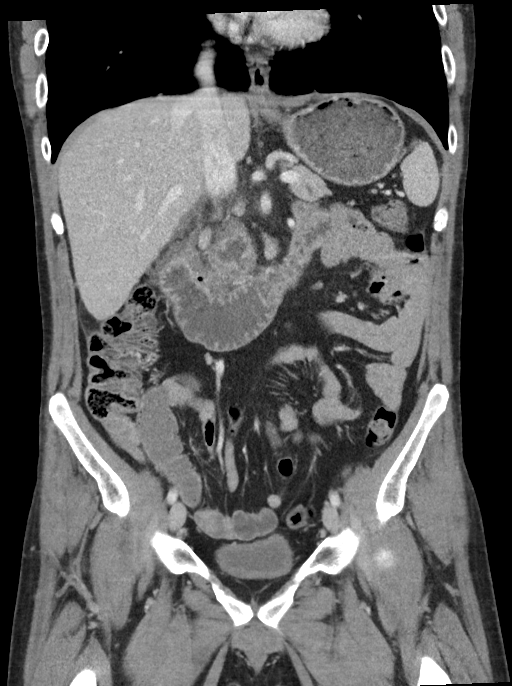
[im 44/79  soft-tissue]
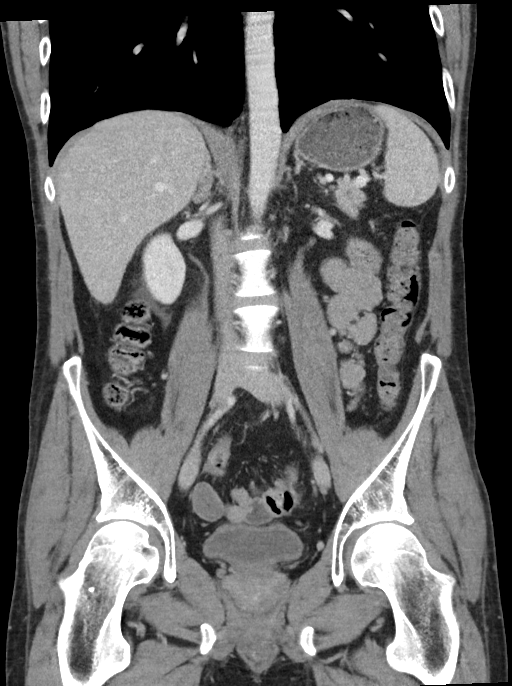

[15 of 46 positions shown; findings below may reference images not displayed]

FINDINGS: Lower chest: No acute abnormality.

Hepatobiliary: No focal liver abnormality is seen. No gallstones,
gallbladder wall thickening, or biliary dilatation.

Pancreas: There is edema near the pancreatic head and body as well
as surrounding the second portion of the duodenum. Cystic lesions in
the pancreatic head and uncinate measure 2.8 cm and 2.2 cm,
respectively, and appear increased in size since [DATE]. These
are most consistent with pseudocysts. The pancreatic parenchyma
appears to enhance homogeneously without definite areas of
pancreatic necrosis. The pancreatic tail appears normal without
definite inflammatory changes. There is no pancreatic duct
dilatation.

Spleen: Normal in size without focal abnormality.

Adrenals/Urinary Tract: Adrenal glands are unremarkable. Kidneys are
normal, without renal calculi, focal lesion, or hydronephrosis.
Bladder is unremarkable.

Stomach/Bowel: Stomach is within normal limits. Mild bowel wall
thickening of the second portion of the duodenum is likely reactive
given the adjacent changes involving the pancreas. Appendix appears
normal. No evidence of bowel wall thickening, distention, or
inflammatory changes.

Vascular/Lymphatic: No significant vascular findings are present. No
evidence of portal vein thrombus or splenic artery aneurysm given
the limitations of this non-angiogram exam. No enlarged abdominal or
pelvic lymph nodes.

Reproductive: Prostate is unremarkable.

Other: No abdominal wall hernia or abnormality. No free
intraperitoneal fluid.

Musculoskeletal: No acute or significant osseous findings.
IMPRESSION: 1. Edema near the pancreatic head and body as well as surrounding
the second portion of the duodenum is most consistent with acute on
chronic interstitial edematous pancreatitis. Pancreatic pseudocysts
have increased in size since [DATE]. No evidence of pancreatic
necrosis.

## 2020-12-25 MED ORDER — OXYCODONE-ACETAMINOPHEN 5-325 MG PO TABS: 1.0000 | ORAL_TABLET | Freq: Three times a day (TID) | ORAL | 0 refills | Status: DC | PRN

## 2020-12-25 MED ORDER — HYDROMORPHONE HCL 1 MG/ML IJ SOLN
1.0000 mg | Freq: Once | INTRAMUSCULAR | Status: AC
  Administered 2020-12-25: 1 mg via INTRAVENOUS
  Filled 2020-12-25: qty 1

## 2020-12-25 MED ORDER — IOHEXOL 300 MG/ML  SOLN
100.0000 mL | Freq: Once | INTRAMUSCULAR | Status: AC | PRN
  Administered 2020-12-25: 100 mL via INTRAVENOUS
  Filled 2020-12-25: qty 100

## 2020-12-25 MED ORDER — PANTOPRAZOLE SODIUM 40 MG IV SOLR
40.0000 mg | Freq: Once | INTRAVENOUS | Status: AC
  Administered 2020-12-25: 40 mg via INTRAVENOUS
  Filled 2020-12-25: qty 40

## 2020-12-25 MED ORDER — ACETAMINOPHEN 500 MG PO TABS
1000.0000 mg | ORAL_TABLET | Freq: Once | ORAL | Status: AC
  Administered 2020-12-25: 1000 mg via ORAL
  Filled 2020-12-25: qty 2

## 2020-12-25 NOTE — ED Triage Notes (Addendum)
Pt comes with c/o abdominal pain that started few weeks ago. Pt states has hx of pancreatitis but this feels different.  Pt states his abdomen feels bloated. Pt states normal BM  Pt denies any N/V/D

## 2020-12-25 NOTE — ED Provider Notes (Signed)
[facility] Emergency Department Provider Note  ____________________________________________   Event Date/Time   First MD Initiated Contact with Patient 12/25/20 1714     (approximate)  I have reviewed the triage vital signs and the nursing notes.   HISTORY  Chief Complaint Abdominal Pain   HPI Shane Braddy Sr. is a 35 y.o. male with a past medical history of previous DVT on Xarelto, pancytopenia, and alcohol abuse with alcohol-induced pancreatitis he states he has not had any alcohol in over a year presents for assessment approximately 5 days of some periumbilical abdominal pain.  He states it feels slightly worse than his prior pancreatitis pain he has had in the past.  He denies any headache, earache, sore throat, chest pain, cough, shortness of breath, nausea, vomiting, diarrhea, dysuria, constipation, acute back pain, rash or extremity pain or recent falls or injuries.  Denies any significant NSAID use EtOH use or illicit drug use.  No other acute concerns at this time.  States he took some gabapentin at home but this did not help.         Past Medical History:  Diagnosis Date  . Alcohol use disorder, severe, dependence [facility])     Patient Active Problem List   Diagnosis Date Noted  . Pressure injury of skin 05/28/2020  . Protein-calorie malnutrition, severe 05/22/2020  . Hypotension   . Abdominal ascites   . Acute alcoholic pancreatitis 05/10/2020  . Acute metabolic encephalopathy 05/10/2020  . Severe malnutrition (HCC) 05/10/2020  . Sinus tachycardia 05/10/2020  . Delirium tremens (HCC) 05/03/2020  . Hyponatremia 05/03/2020  . Hypokalemia 05/03/2020  . AKI (acute kidney injury) (HCC) 05/03/2020  . Elevated CK 05/03/2020  . Heme positive stool 05/03/2020  . Thrombocytopenia (HCC) 05/03/2020    History reviewed. No pertinent surgical history.  Prior to Admission medications   Medication Sig Start Date End Date Taking? Authorizing Provider   oxyCODONE-acetaminophen (PERCOCET) 5-325 MG tablet Take 1 tablet by mouth every 8 (eight) hours as needed for up to 5 days for severe pain. 12/25/20 12/30/20 Yes Gilles Chiquito, MD  ascorbic acid (VITAMIN C) 500 MG tablet Take 1 tablet (500 mg total) by mouth 2 (two) times daily. 05/31/20   Arnetha Courser, MD  DULoxetine (CYMBALTA) 20 MG capsule Take 1 capsule (20 mg total) by mouth daily. 06/01/20   Arnetha Courser, MD  feeding supplement, ENSURE ENLIVE, (ENSURE ENLIVE) LIQD Take 237 mLs by mouth 2 (two) times daily between meals. 05/31/20   Arnetha Courser, MD  ferrous fumarate-b12-vitamic C-folic acid (TRINSICON / FOLTRIN) capsule Take 1 capsule by mouth 2 (two) times daily after a meal. 05/31/20   Arnetha Courser, MD  metoprolol tartrate (LOPRESSOR) 50 MG tablet Take 1 tablet (50 mg total) by mouth 2 (two) times daily. 05/31/20   Arnetha Courser, MD  Multiple Vitamin (MULTIVITAMIN WITH MINERALS) TABS tablet Take 1 tablet by mouth daily. 06/01/20   Arnetha Courser, MD  multivitamin-lutein [facility]) CAPS capsule Take 1 capsule by mouth daily. 05/31/20   Arnetha Courser, MD  OLANZapine zydis (ZYPREXA) 5 MG disintegrating tablet Take 1 tablet (5 mg total) by mouth at bedtime. 05/31/20   Arnetha Courser, MD  pantoprazole (PROTONIX) 40 MG tablet Take 1 tablet (40 mg total) by mouth daily. 06/01/20   Arnetha Courser, MD  rivaroxaban (XARELTO) 20 MG TABS tablet Take 1 tablet (20 mg total) by mouth daily with supper. 05/31/20   Arnetha Courser, MD  vitamin A 3 MG (10000 UNITS) capsule Take 1 capsule (  10,000 Units total) by mouth daily. 05/31/20   Arnetha CourserAmin, Sumayya, MD  Vitamin D, Ergocalciferol, (DRISDOL) 1.25 MG (50000 UNIT) CAPS capsule Take 1 capsule (50,000 Units total) by mouth every 7 (seven) days. 06/06/20   Arnetha CourserAmin, Sumayya, MD  zinc sulfate 220 (50 Zn) MG capsule Take 1 capsule (220 mg total) by mouth daily. 05/31/20   Arnetha CourserAmin, Sumayya, MD    Allergies Sudafed [pseudoephedrine]  Family History  Problem Relation Age of Onset   . Alcohol abuse Mother     Social History Social History   Tobacco Use  . Smoking status: Current Every Day Smoker  . Smokeless tobacco: Never Used  Substance Use Topics  . Alcohol use: Not Currently    Comment: none since June 2021  . Drug use: Not Currently    Review of Systems  Review of Systems  Constitutional: Negative for chills and fever.  HENT: Negative for sore throat.   Eyes: Negative for pain.  Respiratory: Negative for cough and stridor.   Cardiovascular: Negative for chest pain.  Gastrointestinal: Positive for abdominal pain. Negative for vomiting.  Genitourinary: Negative for dysuria.  Musculoskeletal: Negative for myalgias.  Skin: Negative for rash.  Neurological: Negative for seizures, loss of consciousness and headaches.  Psychiatric/Behavioral: Negative for suicidal ideas.  All other systems reviewed and are negative.     ____________________________________________   PHYSICAL EXAM:  VITAL SIGNS: ED Triage Vitals [12/25/20 1639]  Enc Vitals Group     BP 128/84     Pulse      Resp 18     Temp 98.4 F (36.9 C)     Temp src      SpO2 100 %     Weight 125 lb (56.7 kg)     Height 6\' 1"  (1.854 m)     Head Circumference      Peak Flow      Pain Score 7     Pain Loc      Pain Edu?      Excl. in GC?    Vitals:   12/25/20 1734 12/25/20 1924  BP: (!) 135/96 120/76  Pulse: 84 74  Resp: 18 18  Temp:  98.4 F (36.9 C)  SpO2: 100% 100%   Physical Exam Vitals and nursing note reviewed.  Constitutional:      Appearance: He is well-developed and well-nourished.  HENT:     Head: Normocephalic and atraumatic.  Eyes:     Conjunctiva/sclera: Conjunctivae normal.  Cardiovascular:     Rate and Rhythm: Normal rate and regular rhythm.     Heart sounds: No murmur heard.   Pulmonary:     Effort: Pulmonary effort is normal. No respiratory distress.     Breath sounds: Normal breath sounds.  Abdominal:     Palpations: Abdomen is soft.      Tenderness: There is abdominal tenderness in the epigastric area and periumbilical area.  Musculoskeletal:        General: No edema.     Cervical back: Neck supple.  Skin:    General: Skin is warm and dry.     Capillary Refill: Capillary refill takes less than 2 seconds.  Neurological:     Mental Status: He is alert.  Psychiatric:        Mood and Affect: Mood and affect normal.      ____________________________________________   LABS (all labs ordered are listed, but only abnormal results are displayed)  Labs Reviewed  LIPASE, BLOOD - Abnormal; Notable for the following  components:      Result Value   Lipase 242 (*)    All other components within normal limits  COMPREHENSIVE METABOLIC PANEL - Abnormal; Notable for the following components:   Glucose, Bld 103 (*)    All other components within normal limits  URINALYSIS, COMPLETE (UACMP) WITH MICROSCOPIC - Abnormal; Notable for the following components:   Color, Urine YELLOW (*)    APPearance CLEAR (*)    All other components within normal limits  LIPID PANEL - Abnormal; Notable for the following components:   Triglycerides 173 (*)    HDL 37 (*)    LDL Cholesterol 109 (*)    All other components within normal limits  CBC   ____________________________________________  EKG  ____________________________________________  RADIOLOGY  ED MD interpretation: There is evidence of acute appendicitis without abscess or necrosis.  No other clear acute intra-abdominal pelvic pathology.  Official radiology report(s): CT ABDOMEN PELVIS W CONTRAST  Result Date: 12/25/2020 CLINICAL DATA:  Abdominal pain for a few weeks. EXAM: CT ABDOMEN AND PELVIS WITH CONTRAST TECHNIQUE: Multidetector CT imaging of the abdomen and pelvis was performed using the standard protocol following bolus administration of intravenous contrast. CONTRAST:  OMNIPAQUE IOHEXOL 300 MG/ML  SOLN COMPARISON:  CT abdomen pelvis dated 05/22/2020. FINDINGS: Lower  chest: No acute abnormality. Hepatobiliary: No focal liver abnormality is seen. No gallstones, gallbladder wall thickening, or biliary dilatation. Pancreas: There is edema near the pancreatic head and body as well as surrounding the second portion of the duodenum. Cystic lesions in the pancreatic head and uncinate measure 2.8 cm and 2.2 cm, respectively, and appear increased in size since 05/22/2020. These are most consistent with pseudocysts. The pancreatic parenchyma appears to enhance homogeneously without definite areas of pancreatic necrosis. The pancreatic tail appears normal without definite inflammatory changes. There is no pancreatic duct dilatation. Spleen: Normal in size without focal abnormality. Adrenals/Urinary Tract: Adrenal glands are unremarkable. Kidneys are normal, without renal calculi, focal lesion, or hydronephrosis. Bladder is unremarkable. Stomach/Bowel: Stomach is within normal limits. Mild bowel wall thickening of the second portion of the duodenum is likely reactive given the adjacent changes involving the pancreas. Appendix appears normal. No evidence of bowel wall thickening, distention, or inflammatory changes. Vascular/Lymphatic: No significant vascular findings are present. No evidence of portal vein thrombus or splenic artery aneurysm given the limitations of this non-angiogram exam. No enlarged abdominal or pelvic lymph nodes. Reproductive: Prostate is unremarkable. Other: No abdominal wall hernia or abnormality. No free intraperitoneal fluid. Musculoskeletal: No acute or significant osseous findings. IMPRESSION: 1. 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 No evidence of pancreatic necrosis. Electronically Signed   By: Romona Curls M.D.   On: 12/25/2020 18:58     ____________________________________________   PROCEDURES  Procedure(s) performed (including Critical Care):  .1-3 Lead EKG Interpretation Performed by: Gilles Chiquito, MD Authorized by: Gilles Chiquito, MD     Interpretation: normal     ECG rate assessment: normal     Rhythm: sinus rhythm     Ectopy: none     Conduction: normal       ____________________________________________   INITIAL IMPRESSION / ASSESSMENT AND PLAN / ED COURSE      Patient presents with above-stated exam for several days of worsening periumbilical abdominal pain similar to pancreatitis he has had in the past  although he does admit he has not had any recent alcohol in several months.  On arrival he is afebrile hemodynamically stable.  He is quite tender around his epigastrium.  Primary differential includes recurrent acute appendicitis, cholelithiasis, diverticulitis, kidney stone, appendicitis, and PUD.  Lipase is 242 and consistent with acute pancreatitis.  CMP shows no significant electrolyte or metabolic derangements.  CBC shows no leukocytosis and no acute anemia.  UA does not appear infected and has no blood suggestive of stone.  Lipid panel shows triglycerides just above normal but not likely contributory to acute pancreatitis today.  CT abdomen pelvis does show evidence of pancreatitis without abscess or necrosis.  No evidence of kidney stone, diverticulitis, appendicitis or any other clear acute abdominal pelvic pathology.  On reassessment patient states he felt much better after blood noted analgesia.  Given stable vitals with otherwise reassuring exam work-up and pain well controlled and patient tolerating p.o. I believe he is stable for discharge with plan for continued outpatient follow-up.  Discharged stable condition.  Strict precautions advised and discussed.      ____________________________________________   FINAL CLINICAL IMPRESSION(S) / ED DIAGNOSES  Final diagnoses:   Acute pancreatitis, unspecified complication status, unspecified pancreatitis type    Medications  HYDROmorphone (DILAUDID) injection 1 mg (1 mg Intravenous Given 12/25/20 1732)  pantoprazole (PROTONIX) injection 40 mg (40 mg Intravenous Given 12/25/20 1754)  acetaminophen (TYLENOL) tablet 1,000 mg (1,000 mg Oral Given 12/25/20 1800)  iohexol (OMNIPAQUE) 300 MG/ML solution 100 mL (100 mLs Intravenous Contrast Given 12/25/20 1803)     ED Discharge Orders         Ordered    oxyCODONE-acetaminophen (PERCOCET) 5-325 MG tablet  Every 8 hours PRN        12/25/20 1917           Note:  This document was prepared using Dragon voice recognition software and may include unintentional dictation errors.   Gilles Chiquito, MD 12/25/20 1929

## 2020-12-29 ENCOUNTER — Inpatient Hospital Stay
Admission: EM | Admit: 2020-12-29 | Discharge: 2021-01-10 | DRG: 439 | Disposition: A | Discharge status: Home / Self Care | Payer: Medicaid Other | Attending: Internal Medicine | Admitting: Internal Medicine

## 2020-12-29 ENCOUNTER — Other Ambulatory Visit: Payer: Self-pay

## 2020-12-29 DIAGNOSIS — J342 Deviated nasal septum: Secondary | ICD-10-CM | POA: Diagnosis present

## 2020-12-29 DIAGNOSIS — K858 Other acute pancreatitis without necrosis or infection: Secondary | ICD-10-CM | POA: Diagnosis present

## 2020-12-29 DIAGNOSIS — F17210 Nicotine dependence, cigarettes, uncomplicated: Secondary | ICD-10-CM | POA: Diagnosis present

## 2020-12-29 DIAGNOSIS — R21 Rash and other nonspecific skin eruption: Secondary | ICD-10-CM | POA: Diagnosis present

## 2020-12-29 DIAGNOSIS — Z79891 Long term (current) use of opiate analgesic: Secondary | ICD-10-CM | POA: Diagnosis not present

## 2020-12-29 DIAGNOSIS — K861 Other chronic pancreatitis: Secondary | ICD-10-CM | POA: Diagnosis not present

## 2020-12-29 DIAGNOSIS — K7031 Alcoholic cirrhosis of liver with ascites: Secondary | ICD-10-CM

## 2020-12-29 DIAGNOSIS — F17213 Nicotine dependence, cigarettes, with withdrawal: Secondary | ICD-10-CM

## 2020-12-29 DIAGNOSIS — Z79899 Other long term (current) drug therapy: Secondary | ICD-10-CM | POA: Diagnosis not present

## 2020-12-29 DIAGNOSIS — Z888 Allergy status to other drugs, medicaments and biological substances status: Secondary | ICD-10-CM

## 2020-12-29 DIAGNOSIS — Z811 Family history of alcohol abuse and dependence: Secondary | ICD-10-CM

## 2020-12-29 DIAGNOSIS — Z20822 Contact with and (suspected) exposure to covid-19: Secondary | ICD-10-CM | POA: Diagnosis present

## 2020-12-29 DIAGNOSIS — E440 Moderate protein-calorie malnutrition: Secondary | ICD-10-CM | POA: Diagnosis present

## 2020-12-29 DIAGNOSIS — Z4659 Encounter for fitting and adjustment of other gastrointestinal appliance and device: Secondary | ICD-10-CM

## 2020-12-29 DIAGNOSIS — Z8673 Personal history of transient ischemic attack (TIA), and cerebral infarction without residual deficits: Secondary | ICD-10-CM | POA: Diagnosis not present

## 2020-12-29 DIAGNOSIS — F17200 Nicotine dependence, unspecified, uncomplicated: Secondary | ICD-10-CM | POA: Diagnosis present

## 2020-12-29 DIAGNOSIS — Z7902 Long term (current) use of antithrombotics/antiplatelets: Secondary | ICD-10-CM

## 2020-12-29 DIAGNOSIS — Z885 Allergy status to narcotic agent status: Secondary | ICD-10-CM | POA: Diagnosis not present

## 2020-12-29 DIAGNOSIS — Z681 Body mass index (BMI) 19 or less, adult: Secondary | ICD-10-CM | POA: Diagnosis not present

## 2020-12-29 DIAGNOSIS — Z86718 Personal history of other venous thrombosis and embolism: Secondary | ICD-10-CM

## 2020-12-29 DIAGNOSIS — R6339 Other feeding difficulties: Secondary | ICD-10-CM | POA: Diagnosis present

## 2020-12-29 DIAGNOSIS — Y92019 Unspecified place in single-family (private) house as the place of occurrence of the external cause: Secondary | ICD-10-CM

## 2020-12-29 DIAGNOSIS — K859 Acute pancreatitis without necrosis or infection, unspecified: Secondary | ICD-10-CM | POA: Diagnosis present

## 2020-12-29 DIAGNOSIS — J90 Pleural effusion, not elsewhere classified: Secondary | ICD-10-CM | POA: Diagnosis present

## 2020-12-29 DIAGNOSIS — K863 Pseudocyst of pancreas: Secondary | ICD-10-CM | POA: Diagnosis present

## 2020-12-29 DIAGNOSIS — T402X5A Adverse effect of other opioids, initial encounter: Secondary | ICD-10-CM | POA: Diagnosis present

## 2020-12-29 DIAGNOSIS — R188 Other ascites: Secondary | ICD-10-CM | POA: Diagnosis present

## 2020-12-29 DIAGNOSIS — K860 Alcohol-induced chronic pancreatitis: Secondary | ICD-10-CM | POA: Diagnosis present

## 2020-12-29 DIAGNOSIS — Z9889 Other specified postprocedural states: Secondary | ICD-10-CM

## 2020-12-29 DIAGNOSIS — R509 Fever, unspecified: Secondary | ICD-10-CM

## 2020-12-29 DIAGNOSIS — K2980 Duodenitis without bleeding: Secondary | ICD-10-CM | POA: Diagnosis present

## 2020-12-29 LAB — COMPREHENSIVE METABOLIC PANEL
ALT: 18 U/L (ref 0–44)
AST: 21 U/L (ref 15–41)
Albumin: 3.5 g/dL (ref 3.5–5.0)
Alkaline Phosphatase: 56 U/L (ref 38–126)
Anion gap: 13 (ref 5–15)
BUN: 15 mg/dL (ref 6–20)
CO2: 21 mmol/L — ABNORMAL LOW (ref 22–32)
Calcium: 9 mg/dL (ref 8.9–10.3)
Chloride: 104 mmol/L (ref 98–111)
Creatinine, Ser: 0.84 mg/dL (ref 0.61–1.24)
GFR, Estimated: >60 mL/min (ref >60)
Glucose, Bld: 113 mg/dL — ABNORMAL HIGH (ref 70–99)
Potassium: 4.4 mmol/L (ref 3.5–5.1)
Sodium: 138 mmol/L (ref 135–145)
Total Bilirubin: 0.5 mg/dL (ref 0.3–1.2)
Total Protein: 6.2 g/dL — ABNORMAL LOW (ref 6.5–8.1)

## 2020-12-29 LAB — CBC
HCT: 40.9 % (ref 39.0–52.0)
Hemoglobin: 13.8 g/dL (ref 13.0–17.0)
MCH: 31.5 pg (ref 26.0–34.0)
MCHC: 33.7 g/dL (ref 30.0–36.0)
MCV: 93.4 fL (ref 80.0–100.0)
Platelets: 164 10*3/uL (ref 150–400)
RBC: 4.38 MIL/uL (ref 4.22–5.81)
RDW: 12.5 % (ref 11.5–15.5)
WBC: 12 10*3/uL — ABNORMAL HIGH (ref 4.0–10.5)
nRBC: 0 % (ref 0.0–0.2)

## 2020-12-29 LAB — LIPASE, BLOOD: Lipase: 2106 U/L — ABNORMAL HIGH (ref 11–51)

## 2020-12-29 LAB — SARS CORONAVIRUS 2 (TAT 6-24 HRS): SARS Coronavirus 2: NEGATIVE

## 2020-12-29 MED ORDER — VITAMIN A 3 MG (10000 UNIT) PO CAPS
10000.0000 [IU] | ORAL_CAPSULE | Freq: Every day | ORAL | Status: DC
  Administered 2020-12-29 – 2021-01-03 (×5): 10000 [IU] via ORAL
  Filled 2020-12-29 (×9): qty 1

## 2020-12-29 MED ORDER — PANTOPRAZOLE SODIUM 40 MG IV SOLR
40.0000 mg | INTRAVENOUS | Status: DC
  Administered 2020-12-29 – 2021-01-09 (×12): 40 mg via INTRAVENOUS
  Filled 2020-12-29 (×13): qty 40

## 2020-12-29 MED ORDER — SODIUM CHLORIDE 0.9 % IV SOLN
1000.0000 mL | Freq: Once | INTRAVENOUS | Status: AC
  Administered 2020-12-29: 1000 mL via INTRAVENOUS

## 2020-12-29 MED ORDER — VITAMIN D (ERGOCALCIFEROL) 1.25 MG (50000 UNIT) PO CAPS: 50000.0000 [IU] | ORAL_CAPSULE | ORAL | Status: DC

## 2020-12-29 MED ORDER — FENTANYL CITRATE (PF) 100 MCG/2ML IJ SOLN
25.0000 ug | Freq: Once | INTRAMUSCULAR | Status: AC
  Administered 2020-12-29: 25 ug via INTRAVENOUS
  Filled 2020-12-29: qty 2

## 2020-12-29 MED ORDER — ONDANSETRON HCL 4 MG PO TABS: 4.0000 mg | ORAL_TABLET | Freq: Four times a day (QID) | ORAL | Status: DC | PRN

## 2020-12-29 MED ORDER — ONDANSETRON HCL 4 MG/2ML IJ SOLN
4.0000 mg | Freq: Once | INTRAMUSCULAR | Status: AC
  Administered 2020-12-29: 4 mg via INTRAVENOUS
  Filled 2020-12-29: qty 2

## 2020-12-29 MED ORDER — FE FUMARATE-B12-VIT C-FA-IFC PO CAPS: 1.0000 | ORAL_CAPSULE | Freq: Two times a day (BID) | ORAL | Status: DC

## 2020-12-29 MED ORDER — ADULT MULTIVITAMIN W/MINERALS CH: 1.0000 | ORAL_TABLET | Freq: Every day | ORAL | Status: DC

## 2020-12-29 MED ORDER — ASCORBIC ACID 500 MG PO TABS
500.0000 mg | ORAL_TABLET | Freq: Two times a day (BID) | ORAL | Status: DC
  Administered 2020-12-29 – 2021-01-04 (×12): 500 mg via ORAL
  Filled 2020-12-29 (×14): qty 1

## 2020-12-29 MED ORDER — ONDANSETRON HCL 4 MG/2ML IJ SOLN: 4.0000 mg | Freq: Four times a day (QID) | INTRAMUSCULAR | Status: DC | PRN

## 2020-12-29 MED ORDER — VITAMIN D (ERGOCALCIFEROL) 1.25 MG (50000 UNIT) PO CAPS
50000.0000 [IU] | ORAL_CAPSULE | ORAL | Status: DC
  Administered 2021-01-04: 50000 [IU] via ORAL
  Filled 2020-12-29: qty 1

## 2020-12-29 MED ORDER — METOPROLOL TARTRATE 50 MG PO TABS
50.0000 mg | ORAL_TABLET | Freq: Two times a day (BID) | ORAL | Status: DC
Start: 2020-12-29 — End: 2021-01-04
  Administered 2020-12-29 – 2021-01-04 (×11): 50 mg via ORAL
  Filled 2020-12-29 (×12): qty 1

## 2020-12-29 MED ORDER — DULOXETINE HCL 20 MG PO CPEP
20.0000 mg | ORAL_CAPSULE | Freq: Every day | ORAL | Status: DC
  Administered 2020-12-29 – 2021-01-06 (×9): 20 mg via ORAL
  Filled 2020-12-29 (×10): qty 1

## 2020-12-29 MED ORDER — MORPHINE SULFATE (PF) 2 MG/ML IV SOLN
2.0000 mg | Freq: Once | INTRAVENOUS | Status: AC
  Administered 2020-12-29: 2 mg via INTRAVENOUS
  Filled 2020-12-29: qty 1

## 2020-12-29 MED ORDER — THIAMINE HCL 100 MG/ML IJ SOLN
Freq: Once | INTRAVENOUS | Status: DC
  Filled 2020-12-29 (×2): qty 1000

## 2020-12-29 MED ORDER — RIVAROXABAN 20 MG PO TABS
20.0000 mg | ORAL_TABLET | Freq: Every day | ORAL | Status: DC
  Administered 2020-12-29 – 2021-01-04 (×6): 20 mg via ORAL
  Filled 2020-12-29 (×7): qty 1

## 2020-12-29 MED ORDER — ZINC SULFATE 220 (50 ZN) MG PO CAPS
220.0000 mg | ORAL_CAPSULE | Freq: Every day | ORAL | Status: DC
  Administered 2020-12-29 – 2021-01-04 (×7): 220 mg via ORAL
  Filled 2020-12-29 (×9): qty 1

## 2020-12-29 MED ORDER — OCUVITE-LUTEIN PO CAPS: 1.0000 | ORAL_CAPSULE | Freq: Every day | ORAL | Status: DC

## 2020-12-29 MED ORDER — MORPHINE SULFATE (PF) 2 MG/ML IV SOLN
2.0000 mg | INTRAVENOUS | Status: DC | PRN
  Administered 2020-12-29 – 2020-12-30 (×3): 2 mg via INTRAVENOUS
  Filled 2020-12-29 (×3): qty 1

## 2020-12-29 MED ORDER — OLANZAPINE 5 MG PO TBDP
5.0000 mg | ORAL_TABLET | Freq: Every day | ORAL | Status: DC
  Administered 2020-12-29: 5 mg via ORAL
  Filled 2020-12-29: qty 1

## 2020-12-29 NOTE — H&P (Signed)
History and Physical    Shane Jenkins UGQ:[id number] DOB: 12-28-1985 DOA: 12/29/2020  PCP: [name], MD   Patient coming from: Home  I have personally briefly reviewed patient's old medical records in [facility]  Chief Complaint: Abdominal pain                                Rash  HPI: Shane Jenkins Sr. is a 35 y.o. 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  He describes the pain as bandlike across his abdomen and radiating to the back associated with nausea but no vomiting.  He has had multiple episodes of pancreatitis related to alcohol use in the past but states that he has abstain from further alcohol use since his last hospitalization about a year ago.  He was seen in the emergency room about 5 days ago for abdominal pain and was diagnosed with mild pancreatitis and discharged home with a prescription for oxycodone.  He was told to be on a liquid diet and to return to the emergency room if his symptoms worsened.  Patient states that he took 1 tablet of the oxycodone 10 mg and the next day developed a generalized erythematous macular rash which is nonpruritic.  He stopped taking the oxycodone but the rash has persisted. He denies having any fever or chills, no diarrhea, no constipation, no headache, no sore throat, no cough, no chest pain, no shortness of breath, no palpitations, no diaphoresis, no dizziness, no lightheadedness, no headache, no blurred vision, no focal deficits. 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 No evidence of pancreatic necrosis.    ED Course: Patient is a 35 year old Caucasian male who presents to the ER for evaluation of abdominal pain mostly periumbilical with radiation to the back.  His lipase is elevated at 2106 compared to 245 about 5 days ago when he was seen in the ER.  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  Patient also developed an erythematous macular rash following ingestion of oxycodone for pain control.  He will be admitted to the hospital for further evaluation.   Review of Systems: As per HPI otherwise all other systems reviewed and negative.    Past Medical History:  Diagnosis Date  . Alcohol use disorder, severe, dependence (HCC)     History reviewed. No pertinent surgical history.   reports that he has been smoking. He has never used smokeless tobacco. He reports previous alcohol use. He reports previous drug use.  Allergies  Allergen Reactions  . Sudafed [Pseudoephedrine]     History per pt of this being linked with his seizures    Family History  Problem Relation Age of Onset  . Alcohol abuse Mother       Prior to Admission medications   Medication Sig Start Date End Date Taking? Authorizing Provider  ascorbic acid (VITAMIN C) 500 MG tablet Take 1 tablet (500 mg total) by mouth 2 (two) times daily. 05/31/20  Yes Arnetha Courser, MD  diphenhydrAMINE (BENADRYL) 25 mg capsule Take 25 mg by mouth every 6 (six) hours as  needed.   Yes [provider]  DULoxetine (CYMBALTA) 20 MG capsule Take 1 capsule (20 mg total) by mouth daily. 06/01/20  Yes Arnetha Courser, MD  ferrous fumarate-b12-vitamic C-folic acid (TRINSICON / FOLTRIN) capsule Take 1 capsule by mouth 2 (two) times daily after a meal. 05/31/20  Yes Arnetha Courser, MD  gabapentin (NEURONTIN) 100 MG capsule Take 200 mg by mouth in the morning and at bedtime. 12/25/20  Yes [provider]  ibuprofen (ADVIL) 200 MG tablet Take 200 mg by mouth every 4 (four) hours as needed for mild pain or moderate pain.   Yes [provider]  metoprolol tartrate (LOPRESSOR) 50 MG tablet Take 1 tablet (50 mg total) by mouth 2 (two) times daily. 05/31/20  Yes Arnetha Courser, MD  Multiple Vitamin (MULTIVITAMIN WITH MINERALS) TABS tablet Take 1 tablet by mouth daily. 06/01/20  Yes Arnetha Courser, MD  multivitamin-lutein [facility]) CAPS capsule Take 1 capsule by mouth daily. 05/31/20  Yes Arnetha Courser, MD  OLANZapine zydis (ZYPREXA) 5 MG disintegrating tablet Take 1 tablet (5 mg total) by mouth at bedtime. 05/31/20  Yes Arnetha Courser, MD  oxyCODONE-acetaminophen (PERCOCET) 5-325 MG tablet Take 1 tablet by mouth every 8 (eight) hours as needed for up to 5 days for severe pain. 12/25/20 12/30/20 Yes Gilles Chiquito, MD  pantoprazole (PROTONIX) 40 MG tablet Take 1 tablet (40 mg total) by mouth daily. 06/01/20  Yes Arnetha Courser, MD  rivaroxaban (XARELTO) 20 MG TABS tablet Take 1 tablet (20 mg total) by mouth daily with supper. 05/31/20  Yes Arnetha Courser, MD  rOPINIRole (REQUIP) 0.5 MG tablet Take 0.5 mg by mouth at bedtime. 12/04/20  Yes [provider]  traZODone (DESYREL) 50 MG tablet Take 50 mg by mouth at bedtime. 10/15/20  Yes [provider]  vitamin A 3 MG (10000 UNITS) capsule Take 1 capsule (10,000 Units total) by mouth daily. 05/31/20  Yes Arnetha Courser, MD  Vitamin D, Ergocalciferol, (DRISDOL) 1.25 MG (50000 UNIT) CAPS capsule Take 1 capsule  (50,000 Units total) by mouth every 7 (seven) days. 06/06/20  Yes Arnetha Courser, MD  zinc sulfate 220 (50 Zn) MG capsule Take 1 capsule (220 mg total) by mouth daily. 05/31/20  Yes Arnetha Courser, MD  feeding supplement, ENSURE ENLIVE, (ENSURE ENLIVE) LIQD Take 237 mLs by mouth 2 (two) times daily between meals. 05/31/20   Arnetha Courser, MD    Physical Exam: Vitals:   12/29/20 0900 12/29/20 1100 12/29/20 1130 12/29/20 1229  BP: 127/85 129/79  134/83  Pulse: 74 83 78 81  Resp: 20   (!) 22  Temp:      TempSrc:      SpO2: 100% 100% 100% 100%  Weight:      Height:         Vitals:   12/29/20 0900 12/29/20 1100 12/29/20 1130 12/29/20 1229  BP: 127/85 129/79  134/83  Pulse: 74 83 78  81  Resp: 20   (!) 22  Temp:      TempSrc:      SpO2: 100% 100% 100% 100%  Weight:      Height:          Constitutional: Alert and oriented x 3 . Not in any apparent distress HEENT:      Head: Normocephalic and atraumatic.         Eyes: PERLA, EOMI, Conjunctivae are normal. Sclera is non-icteric.       Mouth/Throat: Mucous membranes are moist.       Neck: Supple with no signs of meningismus. Cardiovascular: Regular rate and rhythm. No murmurs, gallops, or rubs. 2+ symmetrical distal pulses are present . No JVD. No LE edema Respiratory: Respiratory effort normal .Lungs sounds clear bilaterally. No wheezes, crackles, or rhonchi.  Gastrointestinal: Soft, tenderness in the peri umbilical area and non distended with positive bowel sounds.  Genitourinary: No CVA tenderness. Musculoskeletal: Nontender with normal range of motion in all extremities. No cyanosis, or erythema of extremities. Neurologic:  Face is symmetric. Moving all extremities. No gross focal neurologic deficits  Skin: Skin is warm, dry.  No rash or ulcers Psychiatric: Mood and affect are normal   Labs on Admission: I have personally reviewed following labs and imaging studies  CBC: Recent Labs  Lab 12/25/20 1641 12/29/20 0746  WBC  9.4 12.0*  HGB 14.1 13.8  HCT 41.4 40.9  MCV 92.0 93.4  PLT 168 164   Basic Metabolic Panel: Recent Labs  Lab 12/25/20 1641 12/29/20 0746  NA 136 138  K 4.2 4.4  CL 101 104  CO2 25 21*  GLUCOSE 103* 113*  BUN 14 15  CREATININE 0.81 0.84  CALCIUM 9.4 9.0   GFR: Estimated Creatinine Clearance: 111.3 mL/min (by C-G formula based on SCr of 0.84 mg/dL). Liver Function Tests: Recent Labs  Lab 12/25/20 1641 12/29/20 0746  AST 17 21  ALT 15 18  ALKPHOS 72 56  BILITOT 0.5 0.5  PROT 6.9 6.2*  ALBUMIN 4.2 3.5   Recent Labs  Lab 12/25/20 1641 12/29/20 0746  LIPASE 242* 2,106*   No results for input(s): AMMONIA in the last 168 hours. Coagulation Profile: No results for input(s): INR, PROTIME in the last 168 hours. Cardiac Enzymes: No results for input(s): CKTOTAL, CKMB, CKMBINDEX, TROPONINI in the last 168 hours. BNP (last 3 results) No results for input(s): PROBNP in the last 8760 hours. HbA1C: No results for input(s): HGBA1C in the last 72 hours. CBG: No results for input(s): GLUCAP in the last 168 hours. Lipid Profile: No results for input(s): CHOL, HDL, LDLCALC, TRIG, CHOLHDL, LDLDIRECT in the last 72 hours. Thyroid Function Tests: No results for input(s): TSH, T4TOTAL, FREET4, T3FREE, THYROIDAB in the last 72 hours. Anemia Panel: No results for input(s): VITAMINB12, FOLATE, FERRITIN, TIBC, IRON, RETICCTPCT in the last 72 hours. Urine analysis:    Component Value Date/Time   COLORURINE YELLOW (A) 12/25/2020 1641   APPEARANCEUR CLEAR (A) 12/25/2020 1641   LABSPEC 1.021 12/25/2020 1641   PHURINE 7.0 12/25/2020 1641   GLUCOSEU NEGATIVE 12/25/2020 1641   HGBUR NEGATIVE 12/25/2020 1641   BILIRUBINUR NEGATIVE 12/25/2020 1641   KETONESUR NEGATIVE 12/25/2020 1641   PROTEINUR NEGATIVE 12/25/2020 1641   NITRITE NEGATIVE 12/25/2020 1641   LEUKOCYTESUR NEGATIVE 12/25/2020 1641    Radiological Exams on Admission: No results found.   Assessment/Plan Principal  Problem:   Acute on chronic pancreatitis (HCC) Active Problems:   Nicotine dependence    Acute  on chronic pancreatitis Patient has a history of chronic alcoholic pancreatitis and presents to the ER for evaluation of worsening abdominal pain mostly in the periumbilical area with radiation to the back associated with nausea. Lipase is elevated at 2106 Patient denies further alcohol use We will keep patient n.p.o. IV pain control, antiemetics and IV fluid hydration    Nicotine dependence Smoking cessation has been discussed with patient He declines a nicotine transdermal patch at this time    History of dural venous sinus thrombosis Patient on chronic anticoagulation therapy with Xarelto    DVT prophylaxis: Xarelto Code Status: full code Family Communication: Greater than 50% of time was spent discussing plan of care with patient at the bedside.  All questions and concerns have been addressed.  He verbalizes understanding and agrees with the plan.  CODE STATUS was discussed and he is a Full code. Disposition Plan: Back to previous home environment Consults called:  Status: Inpatient.  The medical decision making for this patient was of high complexity and patient is at high risk for clinical deterioration during his hospitalization.    Lucile Shutters MD [facility]     12/29/2020, 1:05 PM

## 2020-12-29 NOTE — ED Notes (Signed)
Pt presents to ED with c/o of having an allergic reaction to oxycodone that was RX'ed to him for pain due to DX of pancreatitis. Pt states he started taking oxy Thursday and noticed rash this past Friday (yesterday). Pt states he was told by pharmacist to stop taking Oxy and alternate tylenol and ibuprofen and to also take benadryl. Pt states he has taken 4 doses of 50mg  of benadryl. Pt does have rash to trunk, bilateral arms, legs, and back. Pt also has c/o of ABD pain. Pt denies urinary symptoms.

## 2020-12-29 NOTE — Progress Notes (Signed)
Patient admitted to unit approximately at 1645. Airborne/contact initiated r/t no covid results being back yet. Results still say pending and in progress. Fluids per [facility] not given at this time. Writer reached out to pharmacy x 2.  Upon initial assessment patient A/Ox4 and able to verbalize needs. NAD noted at this time. SR on tele confirmed with CCMD. Call bell within reach, family at bedside. Patient remains NPO. No concerns voiced, will continue to monitor.

## 2020-12-29 NOTE — ED Notes (Signed)
Pt up to bedside toilet.  

## 2020-12-29 NOTE — ED Notes (Signed)
Rash stable; remains at waist/chest/back/neck.

## 2020-12-29 NOTE — ED Triage Notes (Signed)
Pt arrives to ER c/o of rash to trunk and arms. States noticed it the day after starting oxycodone. Called pharmacy and was told to stop taking med and take ibuprofen and benadryl. Also c/o abd pain. Denies N&V&D, denies dysuria. A&O, speaking in complete sentences.

## 2020-12-29 NOTE — ED Notes (Signed)
Pt denies nausea.

## 2020-12-29 NOTE — ED Notes (Signed)
Will give other scheduled meds once verified/received from pharm. Checked tube and med station and [employer] once already.

## 2020-12-29 NOTE — ED Provider Notes (Signed)
[facility] Emergency Department Provider Note   ____________________________________________    I have reviewed the triage vital signs and the nursing notes.   HISTORY  Chief Complaint Rash and Abdominal Pain     HPI Shane Jenkins. is a 35 y.o. male who presents with complaints of rash and abdominal pain.  Patient reports he was seen and diagnosed with pancreatitis in our emergency department 4 days ago, was prescribed oxycodone.  He reports he took the first dose Thursday night and noticed a rash on his abdomen on Friday morning.  He reports he has never had oxycodone before.  He apparently has had pancreatitis multiple times before related to alcohol use.  He reports he has not had any alcohol since being diagnosed for this occurrence.  Denies fevers or chills.  Some nausea.  Complains of epigastric pain which is burning in nature  Past Medical History:  Diagnosis Date  . Alcohol use disorder, severe, dependence [facility])     Patient Active Problem List   Diagnosis Date Noted  . Pressure injury of skin 05/28/2020  . Protein-calorie malnutrition, severe 05/22/2020  . Hypotension   . Abdominal ascites   . Acute alcoholic pancreatitis 55/73/2202  . Acute metabolic encephalopathy 54/27/0623  . Severe malnutrition ([place]) 05/10/2020  . Sinus tachycardia 05/10/2020  . Delirium tremens ([place]) 05/03/2020  . Hyponatremia 05/03/2020  . Hypokalemia 05/03/2020  . AKI (acute kidney injury) ([place]) 05/03/2020  . Elevated CK 05/03/2020  . Heme positive stool 05/03/2020  . Thrombocytopenia ([place]) 05/03/2020    History reviewed. No pertinent surgical history.  Prior to Admission medications   Medication Sig Start Date End Date Taking? Authorizing Provider  ascorbic acid (VITAMIN C) 500 MG tablet Take 1 tablet (500 mg total) by mouth 2 (two) times daily. 05/31/20   Lorella Nimrod, MD  DULoxetine (CYMBALTA) 20 MG capsule Take 1 capsule (20 mg total) by mouth daily.  06/01/20   Lorella Nimrod, MD  feeding supplement, ENSURE ENLIVE, (ENSURE ENLIVE) LIQD Take 237 mLs by mouth 2 (two) times daily between meals. 05/31/20   Lorella Nimrod, MD  ferrous [id number] C-folic acid (TRINSICON / FOLTRIN) capsule Take 1 capsule by mouth 2 (two) times daily after a meal. 05/31/20   Lorella Nimrod, MD  metoprolol tartrate (LOPRESSOR) 50 MG tablet Take 1 tablet (50 mg total) by mouth 2 (two) times daily. 05/31/20   Lorella Nimrod, MD  Multiple Vitamin (MULTIVITAMIN WITH MINERALS) TABS tablet Take 1 tablet by mouth daily. 06/01/20   Lorella Nimrod, MD  multivitamin-lutein [facility]) CAPS capsule Take 1 capsule by mouth daily. 05/31/20   Lorella Nimrod, MD  OLANZapine zydis (ZYPREXA) 5 MG disintegrating tablet Take 1 tablet (5 mg total) by mouth at bedtime. 05/31/20   Lorella Nimrod, MD  oxyCODONE-acetaminophen (PERCOCET) 5-325 MG tablet Take 1 tablet by mouth every 8 (eight) hours as needed for up to 5 days for severe pain. 12/25/20 12/30/20  Lucrezia Starch, MD  pantoprazole (PROTONIX) 40 MG tablet Take 1 tablet (40 mg total) by mouth daily. 06/01/20   Lorella Nimrod, MD  rivaroxaban (XARELTO) 20 MG TABS tablet Take 1 tablet (20 mg total) by mouth daily with supper. 05/31/20   Lorella Nimrod, MD  vitamin A 3 MG (10000 UNITS) capsule Take 1 capsule (10,000 Units total) by mouth daily. 05/31/20   Lorella Nimrod, MD  Vitamin D, Ergocalciferol, (DRISDOL) 1.25 MG (50000 UNIT) CAPS capsule Take 1 capsule (50,000 Units total) by mouth every 7 (seven) days. 06/06/20  Lorella Nimrod, MD  zinc sulfate 220 (50 Zn) MG capsule Take 1 capsule (220 mg total) by mouth daily. 05/31/20   Lorella Nimrod, MD     Allergies Sudafed [pseudoephedrine]  Family History  Problem Relation Age of Onset  . Alcohol abuse Mother     Social History Social History   Tobacco Use  . Smoking status: Current Every Day Smoker  . Smokeless tobacco: Never Used  Substance Use Topics  . Alcohol use: Not Currently     Comment: none since June 2021  . Drug use: Not Currently    Review of Systems  Constitutional: No fever/chills Eyes: No visual changes.  ENT: No sore throat. Cardiovascular: Denies chest pain. Respiratory: Denies shortness of breath. Gastrointestinal: As above Genitourinary: Negative for dysuria. Musculoskeletal: Negative for back pain. Skin: As above Neurological: Negative for headaches or weakness   ____________________________________________   PHYSICAL EXAM:  VITAL SIGNS: ED Triage Vitals  Enc Vitals Group     BP 12/29/20 0729 133/85     Pulse Rate 12/29/20 0729 77     Resp 12/29/20 0729 (!) 22     Temp 12/29/20 0729 97.7 F (36.5 C)     Temp Source 12/29/20 0729 Oral     SpO2 12/29/20 0729 100 %     Weight 12/29/20 0732 63.5 kg (140 lb)     Height 12/29/20 0732 1.854 m (6\' 1" )     Head Circumference --      Peak Flow --      Pain Score 12/29/20 0732 10     Pain Loc --      Pain Edu? --      Excl. in [place]? --     Constitutional: Alert and oriented.  Eyes: Conjunctivae are normal.   Nose: No congestion/rhinnorhea. Mouth/Throat: Mucous membranes are moist.  No intraoral lesions  Cardiovascular: Normal rate, regular rhythm. Grossly normal heart sounds.  Good peripheral circulation. Respiratory: Normal respiratory effort.  No retractions. Lungs CTAB. Gastrointestinal: Soft, tenderness in the epigastrium. No distention.  No CVA tenderness.  Musculoskeletal: No lower extremity tenderness nor edema.  Warm and well perfused Neurologic:  Normal speech and language. No gross focal neurologic deficits are appreciated.  Skin:  Skin is warm, dry and intact.  Macular, erythematous rash noted primarily to the trunk and somewhat to the groin, not pruritic, not painful, suspicious for erythema multiforme with some targetoid lesions Psychiatric: Mood and affect are normal. Speech and behavior are normal.  ____________________________________________   LABS (all labs  ordered are listed, but only abnormal results are displayed)  Labs Reviewed  CBC - Abnormal; Notable for the following components:      Result Value   WBC 12.0 (*)    All other components within normal limits  COMPREHENSIVE METABOLIC PANEL - Abnormal; Notable for the following components:   CO2 21 (*)    Glucose, Bld 113 (*)    Total Protein 6.2 (*)    All other components within normal limits  LIPASE, BLOOD - Abnormal; Notable for the following components:   Lipase 2,106 (*)    All other components within normal limits  SARS CORONAVIRUS 2 (TAT 6-24 HRS)   ____________________________________________  EKG  None ____________________________________________  RADIOLOGY  None ____________________________________________   PROCEDURES  Procedure(s) performed: No  Procedures   Critical Care performed: No ____________________________________________   INITIAL IMPRESSION / ASSESSMENT AND PLAN / ED COURSE  Pertinent labs & imaging results that were available during my care of the patient were  reviewed by me and considered in my medical decision making (see chart for details).  Patient with acute on chronic pancreatitis diagnosed several days ago, reportedly developed rash after taking oxycodone.  He says that was the first time that he has had oxycodone.  Denies a history of allergic reactions in the past.  Denies itching or pain.  No intraoral lesions.  Appears most consistent with mild drug rash, not TENs/Stevens-Johnson.  Patient has discontinued oxycodone  Continues to have significant epigastric pain likely from his pancreatitis, reviewed CT scan from the 15th which demonstrates edematous pancreatitis, acute on chronic.  Patient's lipase has worsened over the last 4 days, treated with IV fentanyl and Zofran and IV fluid here with some improvement.  Given failed outpatient management he will require admission     ____________________________________________   FINAL CLINICAL IMPRESSION(S) / ED DIAGNOSES  Final diagnoses:  Acute on chronic pancreatitis [facility])        Note:  This document was prepared using Dragon voice recognition software and may include unintentional dictation errors.   Lavonia Drafts, MD 12/29/20 [phone]

## 2020-12-30 DIAGNOSIS — K859 Acute pancreatitis without necrosis or infection, unspecified: Secondary | ICD-10-CM | POA: Diagnosis not present

## 2020-12-30 DIAGNOSIS — K861 Other chronic pancreatitis: Secondary | ICD-10-CM | POA: Diagnosis not present

## 2020-12-30 LAB — CBC
HCT: 37.4 % — ABNORMAL LOW (ref 39.0–52.0)
HCT: 37.2 % — ABNORMAL LOW (ref 39.0–52.0)
Hemoglobin: 12.8 g/dL — ABNORMAL LOW (ref 13.0–17.0)
Hemoglobin: 12.7 g/dL — ABNORMAL LOW (ref 13.0–17.0)
MCH: 31.5 pg (ref 26.0–34.0)
MCH: 31.4 pg (ref 26.0–34.0)
MCHC: 34.2 g/dL (ref 30.0–36.0)
MCHC: 34.1 g/dL (ref 30.0–36.0)
MCV: 92.1 fL (ref 80.0–100.0)
MCV: 91.9 fL (ref 80.0–100.0)
Platelets: 162 10*3/uL (ref 150–400)
Platelets: 150 10*3/uL (ref 150–400)
RBC: 4.06 MIL/uL — ABNORMAL LOW (ref 4.22–5.81)
RBC: 4.05 MIL/uL — ABNORMAL LOW (ref 4.22–5.81)
RDW: 12.6 % (ref 11.5–15.5)
RDW: 12.5 % (ref 11.5–15.5)
WBC: 12.4 10*3/uL — ABNORMAL HIGH (ref 4.0–10.5)
WBC: 15.3 10*3/uL — ABNORMAL HIGH (ref 4.0–10.5)
nRBC: 0 % (ref 0.0–0.2)
nRBC: 0 % (ref 0.0–0.2)

## 2020-12-30 LAB — BASIC METABOLIC PANEL
Anion gap: 9 (ref 5–15)
BUN: 10 mg/dL (ref 6–20)
CO2: 24 mmol/L (ref 22–32)
Calcium: 8.3 mg/dL — ABNORMAL LOW (ref 8.9–10.3)
Chloride: 103 mmol/L (ref 98–111)
Creatinine, Ser: 0.66 mg/dL (ref 0.61–1.24)
GFR, Estimated: >60 mL/min (ref >60)
Glucose, Bld: 121 mg/dL — ABNORMAL HIGH (ref 70–99)
Potassium: 3.8 mmol/L (ref 3.5–5.1)
Sodium: 136 mmol/L (ref 135–145)

## 2020-12-30 MED ORDER — LACTATED RINGERS IV SOLN: INTRAVENOUS | Status: DC

## 2020-12-30 MED ORDER — ACETAMINOPHEN 325 MG PO TABS
650.0000 mg | ORAL_TABLET | ORAL | Status: DC | PRN
  Administered 2020-12-30 – 2021-01-04 (×9): 650 mg via ORAL
  Filled 2020-12-30 (×9): qty 2

## 2020-12-30 MED ORDER — DIPHENHYDRAMINE HCL 25 MG PO CAPS
25.0000 mg | ORAL_CAPSULE | Freq: Four times a day (QID) | ORAL | Status: DC | PRN
  Administered 2020-12-30: 25 mg via ORAL
  Filled 2020-12-30: qty 1

## 2020-12-30 NOTE — Progress Notes (Addendum)
PROGRESS NOTE    Shane Jenkins.  CZY:[id number] DOB: 1986/08/14 DOA: 12/29/2020 PCP: [name], MD    Brief Narrative:  Shane Jenkins. is a 35 y.o. 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  He describes the pain as bandlike across his abdomen and radiating to the back associated with nausea but no vomiting.  He has had multiple episodes of pancreatitis related to alcohol use in the past but states that he has abstain from further alcohol use since his last hospitalization about a year ago.  He was seen in the emergency room about 5 days ago for abdominal pain and was diagnosed with mild pancreatitis and discharged home with a prescription for oxycodone.  He was told to be on a liquid diet and to return to the emergency room if his symptoms worsened.  Patient states that he took  2-3  tablets of the oxycodone 10 mg and the next day developed a generalized erythematous macular rash which is nonpruritic.  He stopped taking the oxycodone but the rash has persisted.  2/20- lipase elevated 2106, covid negative. Reports rash better but still persistent.  Consultants:     Procedures:   Antimicrobials:       Subjective: No abdominal pain. No n/v. Rash redness better but persistent  Objective: Vitals:   12/29/20 1946 12/30/20 0038 12/30/20 0520 12/30/20 0733  BP: 138/82 (!) 149/90 133/87 (!) 137/93  Pulse: 99 95 87 87  Resp: 18 18 17 18   Temp: 98 F (36.7 C) 98 F (36.7 C) 98.7 F (37.1 C) 98.2 F (36.8 C)  TempSrc:      SpO2: 98% 100% 99% 97%  Weight:      Height:        Intake/Output Summary (Last 24 hours) at 12/30/2020 0834 Last data filed at 12/30/2020 0455 Gross per 24 hour  Intake --  Output 400 ml  Net -400 ml    Filed Weights   12/29/20 0732  Weight: 63.5 kg    Examination:  General exam: Appears calm and comfortable  Respiratory system: Clear to auscultation. Respiratory effort normal. Cardiovascular system: S1 & S2 heard, RRR. No JVD, murmurs, rubs, gallops or clicks.  Gastrointestinal system: Abdomen is nondistended, soft and nontender. No organomegaly or masses felt. Normal bowel sounds heard. Central nervous system: Alert and oriented. No focal neurological deficits. Extremities: no edema Skin: mild pinkish rash all over body. Psychiatry: Judgement and insight appear normal. Mood & affect appropriate.     Data Reviewed: I have personally reviewed following labs and imaging studies  CBC: Recent Labs  Lab 12/25/20 1641 12/29/20 0746 12/30/20 0415  WBC 9.4 12.0* 12.4*  HGB 14.1 13.8 12.8*  HCT 41.4 40.9 37.4*  MCV 92.0 93.4 92.1  PLT 168 164 162   Basic Metabolic Panel: Recent Labs  Lab 12/25/20 1641 12/29/20 0746 12/30/20 0415  NA 136 138 136  K 4.2 4.4 3.8  CL 101 104 103  CO2 25 21* 24  GLUCOSE 103* 113* 121*  BUN 14 15 10   CREATININE 0.81 0.84 0.66  CALCIUM 9.4 9.0 8.3*   GFR: Estimated Creatinine Clearance: 116.9 mL/min (by C-G formula based on SCr of 0.66 mg/dL). Liver Function Tests: Recent Labs  Lab 12/25/20 1641 12/29/20 0746  AST 17  21  ALT 15 18  ALKPHOS 72 56  BILITOT 0.5 0.5  PROT 6.9 6.2*  ALBUMIN 4.2 3.5   Recent Labs  Lab 12/25/20 1641 12/29/20 0746  LIPASE 242* 2,106*   No results for input(s): AMMONIA in the last 168 hours. Coagulation Profile: No results for input(s): INR, PROTIME in the last 168 hours. Cardiac Enzymes: No results for input(s): CKTOTAL, CKMB, CKMBINDEX, TROPONINI in the last 168 hours. BNP (last 3 results) No results for input(s): PROBNP in the last 8760 hours. HbA1C: No results for input(s): HGBA1C in the last 72 hours. CBG: No results for input(s): GLUCAP in the last 168 hours. Lipid Profile: No results  for input(s): CHOL, HDL, LDLCALC, TRIG, CHOLHDL, LDLDIRECT in the last 72 hours. Thyroid Function Tests: No results for input(s): TSH, T4TOTAL, FREET4, T3FREE, THYROIDAB in the last 72 hours. Anemia Panel: No results for input(s): VITAMINB12, FOLATE, FERRITIN, TIBC, IRON, RETICCTPCT in the last 72 hours. Sepsis Labs: No results for input(s): PROCALCITON, LATICACIDVEN in the last 168 hours.  Recent Results (from the past 240 hour(s))  SARS CORONAVIRUS 2 (TAT 6-24 HRS) Nasopharyngeal Nasopharyngeal Swab     Status: None   Collection Time: 12/29/20 11:24 AM   Specimen: Nasopharyngeal Swab  Result Value Ref Range Status   SARS Coronavirus 2 NEGATIVE NEGATIVE Final    Comment: (NOTE) SARS-CoV-2 target nucleic acids are NOT DETECTED.  The SARS-CoV-2 RNA is generally detectable in upper and lower respiratory specimens during the acute phase of infection. Negative results do not preclude SARS-CoV-2 infection, do not rule out co-infections with other pathogens, and should not be used as the sole basis for treatment or other patient management decisions. Negative results must be combined with clinical observations, patient history, and epidemiological information. The expected result is Negative.  Fact Sheet for Patients: HairSlick.no  Fact Sheet for Healthcare Providers: quierodirigir.com  This test is not yet approved or cleared by the [place] FDA and  has been authorized for detection and/or diagnosis of SARS-CoV-2 by FDA under an Emergency Use Authorization (EUA). This EUA will remain  in effect (meaning this test can be used) for the duration of the COVID-19 declaration under Se ction 564(b)(1) of the Act, 21 U.S.C. section 360bbb-3(b)(1), unless the authorization is terminated or revoked sooner.  Performed at [facility], [address] [place], [place] [address]          Radiology Studies: No results  found.      Scheduled Meds: . ascorbic acid  500 mg Oral BID  . DULoxetine  20 mg Oral Daily  . metoprolol tartrate  50 mg Oral BID  . OLANZapine zydis  5 mg Oral QHS  . pantoprazole (PROTONIX) IV  40 mg Intravenous Q24H  . rivaroxaban  20 mg Oral Q supper  . vitamin A  10,000 Units Oral Daily  . [START ON 01/04/2021] Vitamin D (Ergocalciferol)  50,000 Units Oral Q7 days  . zinc sulfate  220 mg Oral Daily   Continuous Infusions: . lactated ringers    . banana bag IV 1000 mL      Assessment & Plan:   Principal Problem:   Acute on chronic pancreatitis (HCC) Active Problems:   Nicotine dependence   Acute on chronic pancreatitis Patient has a history of chronic alcoholic pancreatitis and presents to the ER for evaluation of worsening abdominal pain mostly in the periumbilical area with radiation to the back associated with nausea. 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 But persisting. woundering if allergic to morphine too. Discussed with wife and pt about stopping morphine and using tylenolol , they are agreeable. Will monitor Will give benadryl prn too.  DVT prophylaxis: Xeralto Code Status: Full Family Communication: Wife at bedside  Status is: Inpatient  Remains inpatient appropriate because:Inpatient level of care appropriate due to severity of illness   Dispo: The patient is from: Home              Anticipated d/c is to: Home              Anticipated d/c date is: 2 days              Patient currently is not medically stable to d/c.   Difficult to place patient No            LOS: 1 day   Time spent: 35 minutes with more  than 50% on COC    Lynn Ito, MD [facility] Pager [phone]  If 7PM-7AM, please contact night-coverage 12/30/2020, 8:34 AM

## 2020-12-30 NOTE — Consult Note (Signed)
[facility] GI Inpatient Consult Note   [name], M.D.  Reason for Consult: Acute on chronic pancreatitis with pseudocysts   Attending requesting consult: Lynn Ito, M.D.   History of Present Illness: Shane Wetherington Sr. is a 35 y.o. male with a long history of alcohol abuse who was admitted yesterday for presumed acute on chronic pancreatitis.  He had severe nausea and vomiting at home without any bleeding but did have several episodes of bilious emesis.  He continues to have abdominal pain although much less since bowel rest has been instituted.  He is getting pain medications and IV resuscitation.  Patient reports to be heavy drinker in the past but maintains that he has not had any alcoholic beverage since July 2021.  He was hospitalized for approximately 1 month at [facility] at that time and has vowed never to drink again.  He has domestic partner with him today that denies also that the patient has had any alcoholic beverage to take in.  He is on no new medications and no new antibiotics or antihypertensive medications. He has chronic pseudocyst formation with interval cyst enlargement noted on CT scan yesterday compared to that of the July 2021 CT scan of the abdomen and pelvis.  Past Medical History:  Past Medical History:  Diagnosis Date  . Alcohol use disorder, severe, dependence (HCC)     Problem List: Patient Active Problem List   Diagnosis Date Noted  . Acute pancreatitis 12/29/2020  . Acute on chronic pancreatitis (HCC) 12/29/2020  . Nicotine dependence 12/29/2020  . Pressure injury of skin 05/28/2020  . Protein-calorie malnutrition, severe 05/22/2020  . Hypotension   . Abdominal ascites   . Acute alcoholic pancreatitis 05/10/2020  . Acute metabolic encephalopathy 05/10/2020  . Severe malnutrition (HCC) 05/10/2020  . Sinus tachycardia 05/10/2020  . Delirium tremens (HCC) 05/03/2020  . Hyponatremia 05/03/2020  . Hypokalemia  05/03/2020  . AKI (acute kidney injury) (HCC) 05/03/2020  . Elevated CK 05/03/2020  . Heme positive stool 05/03/2020  . Thrombocytopenia (HCC) 05/03/2020    Past Surgical History: History reviewed. No pertinent surgical history.  Allergies: Allergies  Allergen Reactions  . Sudafed [Pseudoephedrine]     History per pt of this being linked with his seizures    Home Medications: Medications Prior to Admission  Medication Sig Dispense Refill Last Dose  . ascorbic acid (VITAMIN C) 500 MG tablet Take 1 tablet (500 mg total) by mouth 2 (two) times daily. 180 tablet 1 12/28/2020 at Unknown time  . diphenhydrAMINE (BENADRYL) 25 mg capsule Take 25 mg by mouth every 6 (six) hours as needed.   12/29/2020 at Unknown time  . DULoxetine (CYMBALTA) 20 MG capsule Take 1 capsule (20 mg total) by mouth daily. 30 capsule 3 12/28/2020 at Unknown time  . ferrous fumarate-b12-vitamic C-folic acid (TRINSICON / FOLTRIN) capsule Take 1 capsule by mouth 2 (two) times daily after a meal. 90 capsule 3 12/28/2020 at Unknown time  . gabapentin (NEURONTIN) 100 MG capsule Take 200 mg by mouth in the morning and at bedtime.   12/28/2020 at Unknown time  . ibuprofen (ADVIL) 200 MG tablet Take 200 mg by mouth every 4 (four) hours as needed for mild pain or moderate pain.   12/28/2020 at Unknown time  . metoprolol tartrate (LOPRESSOR) 50 MG tablet Take 1 tablet (50 mg total) by mouth 2 (two) times daily. 60 tablet 1 12/28/2020 at Unknown time  . Multiple Vitamin (MULTIVITAMIN WITH MINERALS) TABS tablet Take 1  tablet by mouth daily. 90 tablet 1 12/28/2020 at Unknown time  . multivitamin-lutein (OCUVITE-LUTEIN) CAPS capsule Take 1 capsule by mouth daily. 90 capsule 1 12/28/2020 at Unknown time  . OLANZapine zydis (ZYPREXA) 5 MG disintegrating tablet Take 1 tablet (5 mg total) by mouth at bedtime. 30 tablet 0 12/28/2020 at Unknown time  . oxyCODONE-acetaminophen (PERCOCET) 5-325 MG tablet Take 1 tablet by mouth every 8 (eight) hours as  needed for up to 5 days for severe pain. 15 tablet 0 12/28/2020 at Unknown time  . pantoprazole (PROTONIX) 40 MG tablet Take 1 tablet (40 mg total) by mouth daily. 90 tablet 1 12/28/2020 at Unknown time  . rivaroxaban (XARELTO) 20 MG TABS tablet Take 1 tablet (20 mg total) by mouth daily with supper. 90 tablet 1 12/28/2020 at Unknown time  . rOPINIRole (REQUIP) 0.5 MG tablet Take 0.5 mg by mouth at bedtime.     . traZODone (DESYREL) 50 MG tablet Take 50 mg by mouth at bedtime.     . vitamin A 3 MG (10000 UNITS) capsule Take 1 capsule (10,000 Units total) by mouth daily. 30 capsule 0 12/28/2020 at Unknown time  . Vitamin D, Ergocalciferol, (DRISDOL) 1.25 MG (50000 UNIT) CAPS capsule Take 1 capsule (50,000 Units total) by mouth every 7 (seven) days. 7 capsule 0 12/28/2020 at Unknown time  . zinc sulfate 220 (50 Zn) MG capsule Take 1 capsule (220 mg total) by mouth daily. 90 capsule 1 12/28/2020 at Unknown time  . feeding supplement, ENSURE ENLIVE, (ENSURE ENLIVE) LIQD Take 237 mLs by mouth 2 (two) times daily between meals. 237 mL 12    Home medication reconciliation was completed with the patient.   Scheduled Inpatient Medications:   . ascorbic acid  500 mg Oral BID  . DULoxetine  20 mg Oral Daily  . metoprolol tartrate  50 mg Oral BID  . pantoprazole (PROTONIX) IV  40 mg Intravenous Q24H  . rivaroxaban  20 mg Oral Q supper  . vitamin A  10,000 Units Oral Daily  . [START ON 01/04/2021] Vitamin D (Ergocalciferol)  50,000 Units Oral Q7 days  . zinc sulfate  220 mg Oral Daily    Continuous Inpatient Infusions:   . lactated ringers 150 mL/hr at 12/30/20 0904  . banana bag IV 1000 mL      PRN Inpatient Medications:  acetaminophen, diphenhydrAMINE, ondansetron **OR** ondansetron (ZOFRAN) IV  Family History: family history includes Alcohol abuse in his mother.   GI Family History: Negative  Social History:   reports that he has been smoking. He has never used smokeless tobacco. He reports  previous alcohol use. He reports previous drug use. The patient denies ETOH, tobacco, or drug use.    Review of Systems: Review of Systems - History obtained from the patient and domestic partner General ROS: positive for  - sleep disturbance negative for - fever, hot flashes or night sweats Psychological ROS: positive for - depression Ophthalmic ROS: negative ENT ROS: negative Allergy and Immunology ROS: positive for - hives and itchy/watery eyes Hematological and Lymphatic ROS: negative Respiratory ROS: no cough, shortness of breath, or wheezing Cardiovascular ROS: no chest pain or dyspnea on exertion Genito-Urinary ROS: no dysuria, trouble voiding, or hematuria Musculoskeletal ROS: negative Neurological ROS: positive for - history of stroke negative for - confusion, memory loss or speech problems Dermatological ROS: positive for rash  Physical Examination: BP (!) 140/94 (BP Location: Left Arm)   Pulse 87   Temp 98.3 F (36.8 C)  Resp 18   Ht 6\' 1"  (1.854 m)   Wt 63.5 kg   SpO2 97%   BMI 18.47 kg/m  Physical Exam Constitutional:      General: He is not in acute distress.    Appearance: He is ill-appearing. He is not toxic-appearing or diaphoretic.  HENT:     Head: Normocephalic and atraumatic.  Cardiovascular:     Rate and Rhythm: Tachycardia present.     Heart sounds: Normal heart sounds. No murmur heard. No gallop.   Pulmonary:     Effort: Pulmonary effort is normal.     Breath sounds: Normal breath sounds.  Abdominal:     General: Bowel sounds are decreased.     Palpations: Abdomen is soft. There is no shifting dullness, fluid wave or mass.     Tenderness: There is generalized abdominal tenderness. There is no guarding or rebound.     Hernia: No hernia is present.  Skin:    General: Skin is warm and dry.     Capillary Refill: Capillary refill takes less than 2 seconds.  Neurological:     General: No focal deficit present.     Mental Status: He is alert.   Psychiatric:        Mood and Affect: Mood normal. Mood is not anxious.     Data: Lab Results  Component Value Date   WBC 15.3 (H) 12/30/2020   HGB 12.7 (L) 12/30/2020   HCT 37.2 (L) 12/30/2020   MCV 91.9 12/30/2020   PLT 150 12/30/2020   Recent Labs  Lab 12/29/20 0746 12/30/20 0415 12/30/20 1113  HGB 13.8 12.8* 12.7*   Lab Results  Component Value Date   NA 136 12/30/2020   K 3.8 12/30/2020   CL 103 12/30/2020   CO2 24 12/30/2020   BUN 10 12/30/2020   CREATININE 0.66 12/30/2020   Lab Results  Component Value Date   ALT 18 12/29/2020   AST 21 12/29/2020   ALKPHOS 56 12/29/2020   BILITOT 0.5 12/29/2020   No results for input(s): APTT, INR, PTT in the last 168 hours. CBC Latest Ref Rng & Units 12/30/2020 12/30/2020 12/29/2020  WBC 4.0 - 10.5 K/uL 15.3(H) 12.4(H) 12.0(H)  Hemoglobin 13.0 - 17.0 g/dL 12.7(L) 12.8(L) 13.8  Hematocrit 39.0 - 52.0 % 37.2(L) 37.4(L) 40.9  Platelets 150 - 400 K/uL 150 162 164    STUDIES: No results found. @IMAGES @  Assessment:  1. Acute interstitial pancreatitis - Possibly due to chronic pancreatitis, though no confirmed calcifications on CT to suggest pancreatitis. Pseudocysts may have an influence, but these are not mature enough to consider drainage.  2. Leukocytosis secondary to #1. 3. History of alcohol abuse. Patient denies any Etoh in the past 7-8 months. 4. Pancreatic pseudocysts - May self resolve, but less likely if pancreatitis recurs. 5. Prothrombotic state with hx of Stroke/TIA. Takes Xarelto at home.  COVID-19 status: Tested negative  Recommendations: 1. Continue bowel rest, IV resuscitation, analgesics, antiemetics 2. Following along with you.  Thank you for the consult. Please call with questions or concerns.  12/31/2020, " MD [facility] [address] [place], [address] [place] [phone]  12/30/2020 1:36 PM

## 2020-12-31 DIAGNOSIS — K861 Other chronic pancreatitis: Secondary | ICD-10-CM | POA: Diagnosis not present

## 2020-12-31 DIAGNOSIS — K859 Acute pancreatitis without necrosis or infection, unspecified: Secondary | ICD-10-CM | POA: Diagnosis not present

## 2020-12-31 LAB — CBC
HCT: 37.9 % — ABNORMAL LOW (ref 39.0–52.0)
Hemoglobin: 13.1 g/dL (ref 13.0–17.0)
MCH: 32 pg (ref 26.0–34.0)
MCHC: 34.6 g/dL (ref 30.0–36.0)
MCV: 92.4 fL (ref 80.0–100.0)
Platelets: 150 10*3/uL (ref 150–400)
RBC: 4.1 MIL/uL — ABNORMAL LOW (ref 4.22–5.81)
RDW: 12.7 % (ref 11.5–15.5)
WBC: 18.9 10*3/uL — ABNORMAL HIGH (ref 4.0–10.5)
nRBC: 0 % (ref 0.0–0.2)

## 2020-12-31 LAB — LIPASE, BLOOD: Lipase: 622 U/L — ABNORMAL HIGH (ref 11–51)

## 2020-12-31 MED ORDER — DIPHENHYDRAMINE HCL 25 MG PO CAPS
25.0000 mg | ORAL_CAPSULE | Freq: Four times a day (QID) | ORAL | Status: DC
  Administered 2020-12-31 – 2021-01-02 (×9): 25 mg via ORAL
  Filled 2020-12-31 (×9): qty 1

## 2020-12-31 NOTE — Progress Notes (Signed)
[facility] Inpatient Progress Note  Subjective: Patient seen for f/u of pancreatitis. Patient reports he is feeling better, still no significant appetite. Pain is better but not resolved. Patient reports having 2 bm's, nonbloody,today.  Objective: Vital signs in last 24 hours: Temp:  [98 F (36.7 C)-99.8 F (37.7 C)] 99.8 F (37.7 C) (02/21 1554) Pulse Rate:  [87-97] 92 (02/21 1554) Resp:  [18-20] 20 (02/21 1554) BP: (122-150)/(73-87) 138/85 (02/21 1554) SpO2:  [97 %-99 %] 98 % (02/21 1554) Blood pressure 138/85, pulse 92, temperature 99.8 F (37.7 C), temperature source Oral, resp. rate 20, height 6\' 1"  (1.854 m), weight 63.5 kg, SpO2 98 %.    Intake/Output from previous day: 02/20 0701 - 02/21 0700 In: 1942.6 [P.O.:60; I.V.:1882.6] Out: 1200 [Urine:1200]  Intake/Output this shift: Total I/O In: -  Out: 350 [Urine:350]   General appearance:  Alert, NAD Resp:CTA Cardio: RRR, no gallop GI: distended, diffusely mildly tender BS+ Extremities:  No edema   Lab Results: Results for orders placed or performed during the hospital encounter of 12/29/20 (from the past 24 hour(s))  Lipase, blood     Status: Abnormal   Collection Time: 12/31/20  3:32 AM  Result Value Ref Range   Lipase 622 (H) 11 - 51 U/L  CBC     Status: Abnormal   Collection Time: 12/31/20  3:32 AM  Result Value Ref Range   WBC 18.9 (H) 4.0 - 10.5 K/uL   RBC 4.10 (L) 4.22 - 5.81 MIL/uL   Hemoglobin 13.1 13.0 - 17.0 g/dL   HCT 01/02/21 (L) 59.5 - 63.8 %   MCV 92.4 80.0 - 100.0 fL   MCH 32.0 26.0 - 34.0 pg   MCHC 34.6 30.0 - 36.0 g/dL   RDW 75.6 43.3 - 29.5 %   Platelets 150 150 - 400 K/uL   nRBC 0.0 0.0 - 0.2 %     Recent Labs    12/30/20 0415 12/30/20 1113 12/31/20 0332  WBC 12.4* 15.3* 18.9*  HGB 12.8* 12.7* 13.1  HCT 37.4* 37.2* 37.9*  PLT 162 150 150   BMET Recent Labs    12/29/20 0746 12/30/20 0415  NA 138 136  K 4.4 3.8  CL 104 103  CO2 21* 24  GLUCOSE 113* 121*   BUN 15 10  CREATININE 0.84 0.66  CALCIUM 9.0 8.3*   LFT Recent Labs    12/29/20 0746  PROT 6.2*  ALBUMIN 3.5  AST 21  ALT 18  ALKPHOS 56  BILITOT 0.5   PT/INR No results for input(s): LABPROT, INR in the last 72 hours. Hepatitis Panel No results for input(s): HEPBSAG, HCVAB, HEPAIGM, HEPBIGM in the last 72 hours. C-Diff No results for input(s): CDIFFTOX in the last 72 hours. No results for input(s): CDIFFPCR in the last 72 hours.   Studies/Results: No results found.  Scheduled Inpatient Medications:   . ascorbic acid  500 mg Oral BID  . diphenhydrAMINE  25 mg Oral Q6H  . DULoxetine  20 mg Oral Daily  . metoprolol tartrate  50 mg Oral BID  . pantoprazole (PROTONIX) IV  40 mg Intravenous Q24H  . rivaroxaban  20 mg Oral Q supper  . vitamin A  10,000 Units Oral Daily  . [START ON 01/04/2021] Vitamin D (Ergocalciferol)  50,000 Units Oral Q7 days  . zinc sulfate  220 mg Oral Daily    Continuous Inpatient Infusions:   . lactated ringers 200 mL/hr at 12/31/20 1527    PRN Inpatient Medications:  acetaminophen,  ondansetron **OR** ondansetron (ZOFRAN) IV    Assessment:  1. Leukocytosis - Persists, considered as a marker for continued pancreatitis. 2. Acute pancreatitis - Reportedly hx of Chronic pancreatitis from alcohol abuse. Patient reportedly abstinent for 6-7 months.   Plan:  1. Continue NPO except small water sips, ice chips 2. Increase IVF's to 200. Done earlier today. 3. Following.   Lexine Jaspers K. Norma Fredrickson, M.D. 12/31/2020, 5:25 PM

## 2020-12-31 NOTE — Progress Notes (Signed)
PROGRESS NOTE    Shane Hellmer Sr.  NWG:[id number] DOB: Sep 10, 1986 DOA: 12/29/2020 PCP: [name], MD    Brief Narrative:  Shane Sergeant Sr. is a 35 y.o. 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  He describes the pain as bandlike across his abdomen and radiating to the back associated with nausea but no vomiting.  He has had multiple episodes of pancreatitis related to alcohol use in the past but states that he has abstain from further alcohol use since his last hospitalization about a year ago.  He was seen in the emergency room about 5 days ago for abdominal pain and was diagnosed with mild pancreatitis and discharged home with a prescription for oxycodone.  He was told to be on a liquid diet and to return to the emergency room if his symptoms worsened.  Patient states that he took  2-3  tablets of the oxycodone 10 mg and the next day developed a generalized erythematous macular rash which is nonpruritic.  He stopped taking the oxycodone but the rash has persisted.  2/20- lipase elevated 2106, covid negative. Reports rash better but still persistent.  Consultants:     Procedures:   Antimicrobials:       Subjective: No abdominal pain. No n/v. Rash redness better but persistent  Objective: Vitals:   12/30/20 2305 12/31/20 0416 12/31/20 0727 12/31/20 1151  BP: 122/73 135/79 (!) 150/85 136/84  Pulse: 87 88 89 88  Resp: Temp: 98.4 F (36.9 C) 98.8 F (37.1 C) 98 F (36.7 C) 98 F (36.7 C)  TempSrc:  Oral    SpO2: 98% 99% 98% 99%  Weight:      Height:        Intake/Output Summary (Last 24 hours) at 12/31/2020 1201 Last data filed at 12/31/2020 0418 Gross per 24 hour  Intake 1942.57 ml  Output 1200 ml  Net  742.57 ml   Filed Weights   12/29/20 0732  Weight: 63.5 kg    Examination:  General exam: Appears calm and comfortable  Respiratory system: Clear to auscultation. Respiratory effort normal. Cardiovascular system: S1 & S2 heard, RRR. No JVD, murmurs, rubs, gallops or clicks.  Gastrointestinal system: Abdomen is nondistended, soft and nontender. No organomegaly or masses felt. Normal bowel sounds heard. Central nervous system: Alert and oriented. No focal neurological deficits. Extremities: no edema Skin: mild pinkish rash all over body. Psychiatry: Judgement and insight appear normal. Mood & affect appropriate.     Data Reviewed: I have personally reviewed following labs and imaging studies  CBC: Recent Labs  Lab 12/25/20 1641 12/29/20 0746 12/30/20 0415 12/30/20 1113 12/31/20 0332  WBC 9.4 12.0* 12.4* 15.3* 18.9*  HGB 14.1 13.8 12.8* 12.7* 13.1  HCT 41.4 40.9 37.4* 37.2* 37.9*  MCV 92.0 93.4 92.1 91.9 92.4  PLT 168 164 162 150 150   Basic Metabolic Panel: Recent Labs  Lab 12/25/20 1641 12/29/20 0746 12/30/20 0415  NA 136 138 136  K 4.2 4.4 3.8  CL 101 104 103  CO2 25 21* 24  GLUCOSE 103* 113* 121*  BUN CREATININE 0.81 0.84 0.66  CALCIUM 9.4 9.0 8.3*   GFR: Estimated Creatinine Clearance: 116.9 mL/min (by C-G formula based on SCr of 0.66  mg/dL). Liver Function Tests: Recent Labs  Lab 12/25/20 1641 12/29/20 0746  AST 17 21  ALT 15 18  ALKPHOS 72 56  BILITOT 0.5 0.5  PROT 6.9 6.2*  ALBUMIN 4.2 3.5   Recent Labs  Lab 12/25/20 1641 12/29/20 0746 12/31/20 0332  LIPASE 242* 2,106* 622*   No results for input(s): AMMONIA in the last 168 hours. Coagulation Profile: No results for input(s): INR, PROTIME in the last 168 hours. Cardiac Enzymes: No results for input(s): CKTOTAL, CKMB, CKMBINDEX, TROPONINI in the last 168 hours. BNP (last 3 results) No results for input(s): PROBNP in the last 8760 hours. HbA1C: No results for input(s):  HGBA1C in the last 72 hours. CBG: No results for input(s): GLUCAP in the last 168 hours. Lipid Profile: No results for input(s): CHOL, HDL, LDLCALC, TRIG, CHOLHDL, LDLDIRECT in the last 72 hours. Thyroid Function Tests: No results for input(s): TSH, T4TOTAL, FREET4, T3FREE, THYROIDAB in the last 72 hours. Anemia Panel: No results for input(s): VITAMINB12, FOLATE, FERRITIN, TIBC, IRON, RETICCTPCT in the last 72 hours. Sepsis Labs: No results for input(s): PROCALCITON, LATICACIDVEN in the last 168 hours.  Recent Results (from the past 240 hour(s))  SARS CORONAVIRUS 2 (TAT 6-24 HRS) Nasopharyngeal Nasopharyngeal Swab     Status: None   Collection Time: 12/29/20 11:24 AM   Specimen: Nasopharyngeal Swab  Result Value Ref Range Status   SARS Coronavirus 2 NEGATIVE NEGATIVE Final    Comment: (NOTE) SARS-CoV-2 target nucleic acids are NOT DETECTED.  The SARS-CoV-2 RNA is generally detectable in upper and lower respiratory specimens during the acute phase of infection. Negative results do not preclude SARS-CoV-2 infection, do not rule out co-infections with other pathogens, and should not be used as the sole basis for treatment or other patient management decisions. Negative results must be combined with clinical observations, patient history, and epidemiological information. The expected result is Negative.  Fact Sheet for Patients: HairSlick.no  Fact Sheet for Healthcare Providers: quierodirigir.com  This test is not yet approved or cleared by the [place] FDA and  has been authorized for detection and/or diagnosis of SARS-CoV-2 by FDA under an Emergency Use Authorization (EUA). This EUA will remain  in effect (meaning this test can be used) for the duration of the COVID-19 declaration under Se ction 564(b)(1) of the Act, 21 U.S.C. section 360bbb-3(b)(1), unless the authorization is terminated or revoked sooner.  Performed  at [facility], [address] [place], [place] [address]          Radiology Studies: No results found.      Scheduled Meds: . ascorbic acid  500 mg Oral BID  . diphenhydrAMINE  25 mg Oral Q6H  . DULoxetine  20 mg Oral Daily  . metoprolol tartrate  50 mg Oral BID  . pantoprazole (PROTONIX) IV  40 mg Intravenous Q24H  . rivaroxaban  20 mg Oral Q supper  . vitamin A  10,000 Units Oral Daily  . [START ON 01/04/2021] Vitamin D (Ergocalciferol)  50,000 Units Oral Q7 days  . zinc sulfate  220 mg Oral Daily   Continuous Infusions: . lactated ringers 125 mL/hr at 12/31/20 0930    Assessment & Plan:   Principal Problem:   Acute on chronic pancreatitis (HCC) Active Problems:   Nicotine dependence   Acute on chronic pancreatitis Patient has a history of chronic alcoholic pancreatitis and presents to the ER for evaluation of worsening abdominal pain mostly in the periumbilical area with radiation to the back associated  with nausea. Last drink one year ago Lipase is elevated at 2106 2/20-keep npo Iv pain med Restart ivf LR @ 13650ml/hr GI consulted as on Ct there is a pseudocyst     Nicotine dependence Counseling offered during this admission Pt declined nicotine patch     History of dural venous sinus thrombosis On chronic a/c, continue Xeralto   Skin rash- after taking oxycodone. But persisting. woundering if allergic to morphine too. Discussed with wife and pt about stopping morphine and using tylenolol , they are agreeable. Will monitor Will give benadryl prn too.  DVT prophylaxis: Xeralto Code Status: Full Family Communication: Wife at bedside  Status is: Inpatient  Remains inpatient appropriate because:Inpatient level of care appropriate due to severity of illness   Dispo: The patient is from: Home              Anticipated d/c is to: Home              Anticipated d/c date is: 2 days              Patient currently is not medically stable  to d/c.   Difficult to place patient No            LOS: 2 days   Time spent: 35 minutes with more than 50% on COC    Lynn ItoSahar Gurtaj Ruz, MD [facility] Pager [phone]  If 7PM-7AM, please contact night-coverage 12/31/2020, 12:01 PM  PROGRESS NOTE    Shane SergeantLarry Monier Sr.  ZOX:[id number] DOB: 12/15/1985 DOA: 12/29/2020 PCP: Evelene CroonNiemeyer, Meindert, MD    Brief Narrative:  Shane SergeantLarry Sutter Sr. is a 35 y.o. 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  He describes the pain as bandlike across his abdomen and radiating to the back associated with nausea but no vomiting.  He has had multiple episodes of pancreatitis related to alcohol use in the past but states that he has abstain from further alcohol use since his last hospitalization about a year ago.  He was seen in the emergency room about 5 days ago for abdominal pain and was diagnosed with mild pancreatitis and discharged home with a prescription for oxycodone.  He was told to be on a liquid diet and to return to the emergency room if his symptoms worsened.  Patient states that he took  2-3  tablets of the oxycodone 10 mg and the next day developed a generalized erythematous macular rash which is nonpruritic.  He stopped taking the oxycodone but the rash has persisted.  2/20- lipase elevated 2106, covid negative. Reports rash better but still persistent. 2/21-rash hives improving.   Consultants:   GI  Procedures:   Antimicrobials:       Subjective: Feels rash has improved.  No abdominal pain.  N.p.o.  No fever or chills.  Objective: Vitals:   12/30/20 2041 12/30/20 2305 12/31/20 0416 12/31/20 0727  BP: 140/87 122/73 135/79 (!) 150/85  Pulse: 97 87 88 89  Resp: 18 20 18 20   Temp: 98.7  F (37.1 C) 98.4 F (36.9 C) 98.8 F (37.1 C) 98 F (36.7 C)  TempSrc: Oral  Oral   SpO2: 97% 98% 99% 98%  Weight:      Height:        Intake/Output Summary (Last 24  hours) at 12/31/2020 0805 Last data filed at 12/31/2020 0418 Gross per 24 hour  Intake 1942.57 ml  Output 1200 ml  Net 742.57 ml   Filed Weights   12/29/20 0732  Weight: 63.5 kg    Examination: Nad, calm CTA, no wheeze rales rhonchi's Regular S1-S2 Abdomen soft nontender nondistended positive bowel sounds No edema Skin with less rash/hives improving overall Alert oriented x3 grossly intact   Data Reviewed: I have personally reviewed following labs and imaging studies  CBC: Recent Labs  Lab 12/25/20 1641 12/29/20 0746 12/30/20 0415 12/30/20 1113  WBC 9.4 12.0* 12.4* 15.3*  HGB 14.1 13.8 12.8* 12.7*  HCT 41.4 40.9 37.4* 37.2*  MCV 92.0 93.4 92.1 91.9  PLT 168 164 162 150   Basic Metabolic Panel: Recent Labs  Lab 12/25/20 1641 12/29/20 0746 12/30/20 0415  NA 136 138 136  K 4.2 4.4 3.8  CL 101 104 103  CO2 25 21* 24  GLUCOSE 103* 113* 121*  BUN 14 15 10   CREATININE 0.81 0.84 0.66  CALCIUM 9.4 9.0 8.3*   GFR: Estimated Creatinine Clearance: 116.9 mL/min (by C-G formula based on SCr of 0.66 mg/dL). Liver Function Tests: Recent Labs  Lab 12/25/20 1641 12/29/20 0746  AST 17 21  ALT 15 18  ALKPHOS 72 56  BILITOT 0.5 0.5  PROT 6.9 6.2*  ALBUMIN 4.2 3.5   Recent Labs  Lab 12/25/20 1641 12/29/20 0746 12/31/20 0332  LIPASE 242* 2,106* 622*   No results for input(s): AMMONIA in the last 168 hours. Coagulation Profile: No results for input(s): INR, PROTIME in the last 168 hours. Cardiac Enzymes: No results for input(s): CKTOTAL, CKMB, CKMBINDEX, TROPONINI in the last 168 hours. BNP (last 3 results) No results for input(s): PROBNP in the last 8760 hours. HbA1C: No results for input(s): HGBA1C in the last 72 hours. CBG: No results for input(s): GLUCAP in the last 168  hours. Lipid Profile: No results for input(s): CHOL, HDL, LDLCALC, TRIG, CHOLHDL, LDLDIRECT in the last 72 hours. Thyroid Function Tests: No results for input(s): TSH, T4TOTAL, FREET4, T3FREE, THYROIDAB in the last 72 hours. Anemia Panel: No results for input(s): VITAMINB12, FOLATE, FERRITIN, TIBC, IRON, RETICCTPCT in the last 72 hours. Sepsis Labs: No results for input(s): PROCALCITON, LATICACIDVEN in the last 168 hours.  Recent Results (from the past 240 hour(s))  SARS CORONAVIRUS 2 (TAT 6-24 HRS) Nasopharyngeal Nasopharyngeal Swab     Status: None   Collection Time: 12/29/20 11:24 AM   Specimen: Nasopharyngeal Swab  Result Value Ref Range Status   SARS Coronavirus 2 NEGATIVE NEGATIVE Final    Comment: (NOTE) SARS-CoV-2 target nucleic acids are NOT DETECTED.  The SARS-CoV-2 RNA is generally detectable in upper and lower respiratory specimens during the acute phase of infection. Negative results do not preclude SARS-CoV-2 infection, do not rule out co-infections with other pathogens, and should not be used as the sole basis for treatment or other patient management decisions. Negative results must be combined with clinical observations, patient history, and epidemiological information. The expected result is Negative.  Fact Sheet for Patients: 12/31/20  Fact Sheet for Healthcare Providers: HairSlick.no  This test is not yet approved or cleared by the [place] FDA and  has been authorized for detection and/or diagnosis of SARS-CoV-2 by FDA under an Emergency Use Authorization (EUA). This EUA will remain  in effect (meaning this test can be used) for the duration of the COVID-19 declaration under Se ction 564(b)(1) of the Act, 21 U.S.C. section 360bbb-3(b)(1),  unless the authorization is terminated or revoked sooner.  Performed at [facility], [address] [place], [place] [address]           Radiology Studies: No results found.      Scheduled Meds: . ascorbic acid  500 mg Oral BID  . DULoxetine  20 mg Oral Daily  . metoprolol tartrate  50 mg Oral BID  . pantoprazole (PROTONIX) IV  40 mg Intravenous Q24H  . rivaroxaban  20 mg Oral Q supper  . vitamin A  10,000 Units Oral Daily  . [START ON 01/04/2021] Vitamin D (Ergocalciferol)  50,000 Units Oral Q7 days  . zinc sulfate  220 mg Oral Daily   Continuous Infusions: . lactated ringers 150 mL/hr at 12/31/20 0615  . banana bag IV 1000 mL      Assessment & Plan:   Principal Problem:   Acute on chronic pancreatitis (HCC) Active Problems:   Nicotine dependence   Acute on chronic pancreatitis Patient has a history of chronic alcoholic pancreatitis and presents to the ER for evaluation of worsening abdominal pain mostly in the periumbilical area with radiation to the back associated with nausea. Last drink one year ago Lipase is elevated at 2106 2/21 -has no pain.  However WBC continuing to rise Decrease ivf to 115ml/hr Npo  Continue f/u with GI rec.      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 But persisting. woundering if allergic to morphine too. Discussed with wife and pt about stopping morphine and using tylenolol , they are agreeable. 2/21-improving. Will change benadryl from prn to standing dose   DVT prophylaxis: Xeralto Code Status: Full Family Communication: Wife at bedside  Status is: Inpatient  Remains inpatient appropriate because:Inpatient level of care appropriate due to severity of illness   Dispo: The patient is from: Home              Anticipated d/c is to: Home              Anticipated d/c date is: 2 days              Patient currently is not medically stable to d/c.   Difficult to  place patient No            LOS: 2 days   Time spent: 35 minutes with more than 50% on COC    Lynn Ito, MD [facility] Pager [phone]  If 7PM-7AM, please contact night-coverage 12/31/2020, 8:05 AM

## 2021-01-01 ENCOUNTER — Inpatient Hospital Stay: Payer: Medicaid Other

## 2021-01-01 ENCOUNTER — Encounter: Payer: Self-pay | Admitting: Internal Medicine

## 2021-01-01 DIAGNOSIS — K861 Other chronic pancreatitis: Secondary | ICD-10-CM | POA: Diagnosis not present

## 2021-01-01 DIAGNOSIS — K859 Acute pancreatitis without necrosis or infection, unspecified: Secondary | ICD-10-CM | POA: Diagnosis not present

## 2021-01-01 LAB — CBC
HCT: 34.3 % — ABNORMAL LOW (ref 39.0–52.0)
Hemoglobin: 12 g/dL — ABNORMAL LOW (ref 13.0–17.0)
MCH: 32 pg (ref 26.0–34.0)
MCHC: 35 g/dL (ref 30.0–36.0)
MCV: 91.5 fL (ref 80.0–100.0)
Platelets: 146 10*3/uL — ABNORMAL LOW (ref 150–400)
RBC: 3.75 MIL/uL — ABNORMAL LOW (ref 4.22–5.81)
RDW: 12.6 % (ref 11.5–15.5)
WBC: 18 10*3/uL — ABNORMAL HIGH (ref 4.0–10.5)
nRBC: 0 % (ref 0.0–0.2)

## 2021-01-01 LAB — URINALYSIS, ROUTINE W REFLEX MICROSCOPIC
Bacteria, UA: NONE SEEN
Bilirubin Urine: NEGATIVE
Glucose, UA: NEGATIVE mg/dL
Hgb urine dipstick: NEGATIVE
Ketones, ur: 80 mg/dL — AB
Leukocytes,Ua: NEGATIVE
Nitrite: NEGATIVE
Protein, ur: 30 mg/dL — AB
Specific Gravity, Urine: 1.019 (ref 1.005–1.030)
pH: 5 (ref 5.0–8.0)

## 2021-01-01 LAB — LIPASE, BLOOD: Lipase: 185 U/L — ABNORMAL HIGH (ref 11–51)

## 2021-01-01 IMAGING — DX DG CHEST 1V PORT
1 series · 1 of 1 positions shown · non-contrast
Comparison: Chest x-ray [DATE].

CLINICAL DATA: Fever.  History of pancreatitis.

EXAM:
PORTABLE CHEST 1 VIEW

[chest ap]
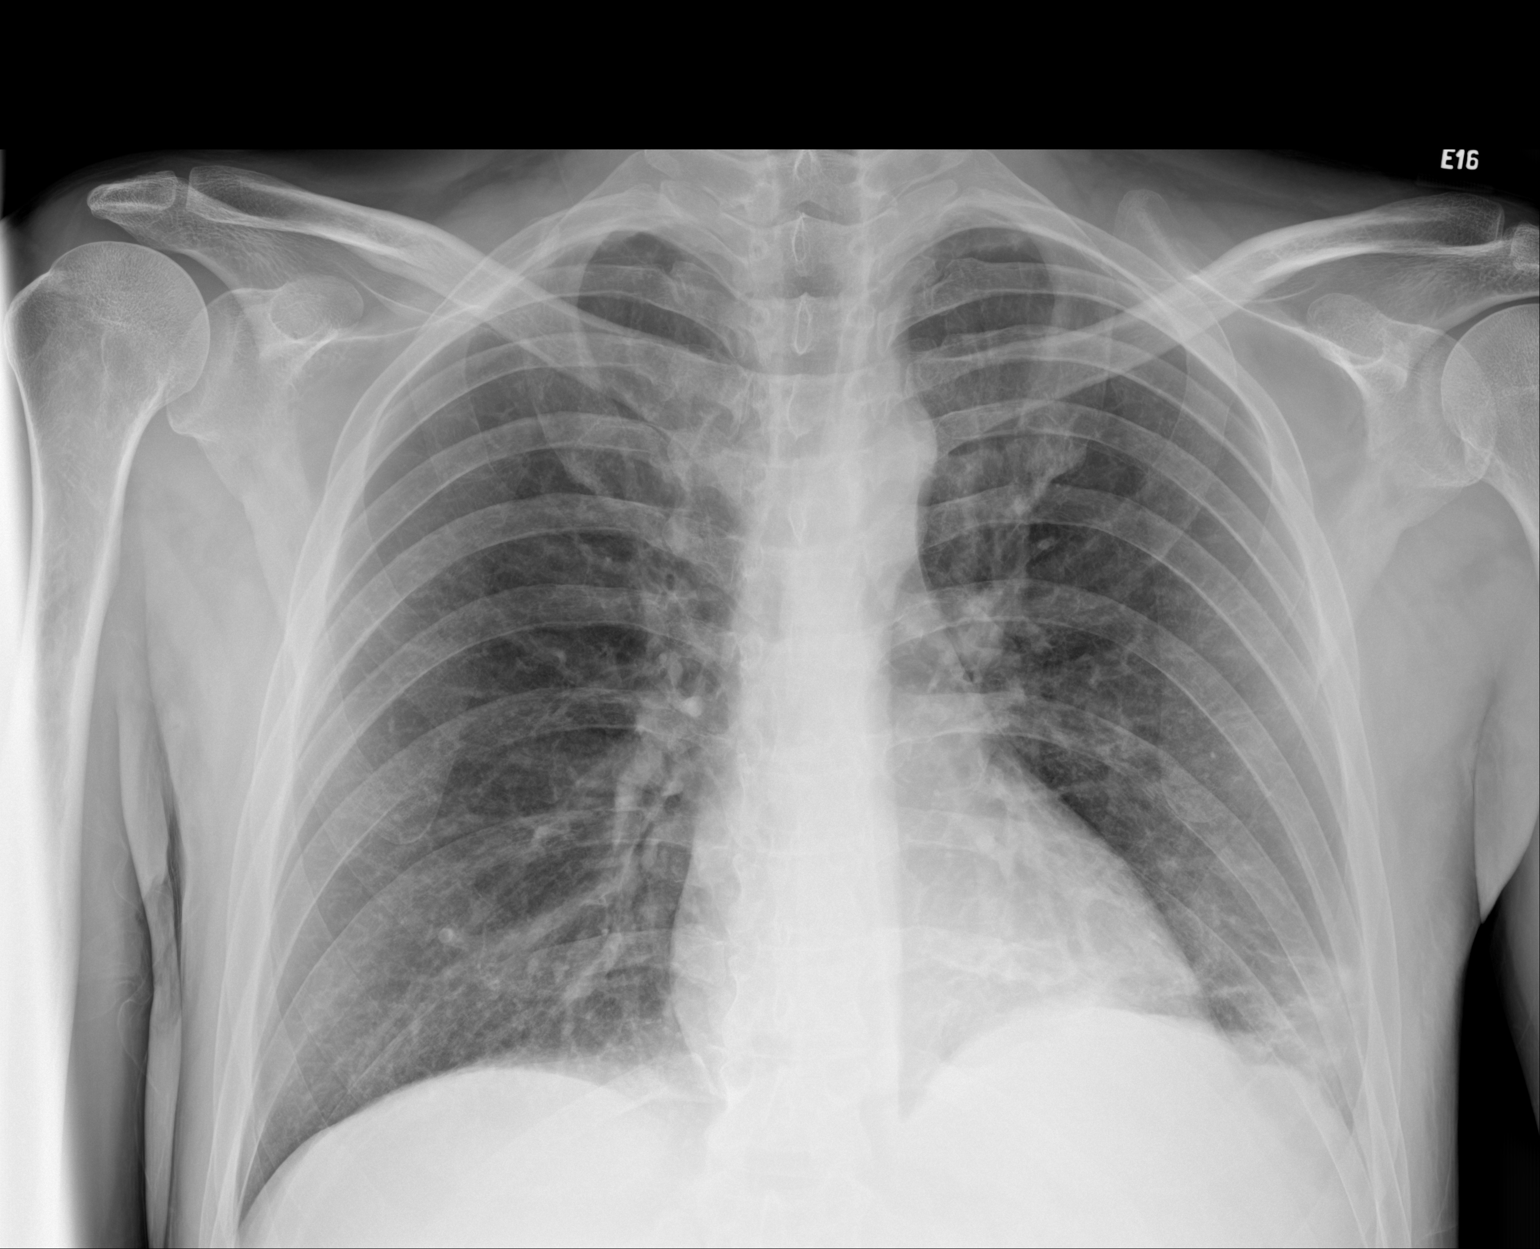

[1 of 1 positions shown; findings below may reference images not displayed]

FINDINGS: Mediastinum and hilar structures normal. Heart size normal. Interim
significant improvement in left base infiltrate with mild residual.
No prominent pleural effusion noted on today's exam. No
pneumothorax.
IMPRESSION: Interim significant improvement in left base infiltrate with mild
residual. No prominent pleural effusion noted on today's exam

## 2021-01-01 IMAGING — CT CT ABDOMEN WO/W CM
2 of 10 series · 11 of 46 positions shown, 16 images · IV contrast (omnipaque)
Comparison: [DATE]

CLINICAL DATA: History of pancreatitis, necrotizing pancreatitis
with fever and white blood cell count.

EXAM:
CT ABDOMEN WITHOUT AND WITH CONTRAST
TECHNIQUE: Multidetector CT imaging of the abdomen was performed following the
standard protocol before and following the bolus administration of
intravenous contrast.
CONTRAST:  100mL OMNIPAQUE IOHEXOL 300 MG/ML  SOLN

[Series 7: axial venous · axial · portal-venous · 0.65mm/px · z∈[-332,-119]mm · 8 of 93 slices shown, 13 images]
[im 11/93  soft-tissue]
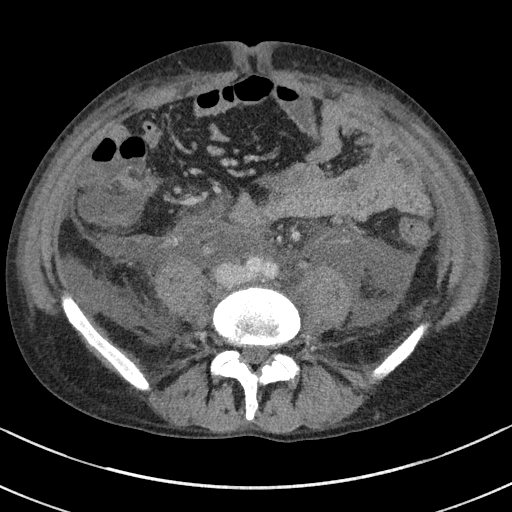
[im 11/93  bone]
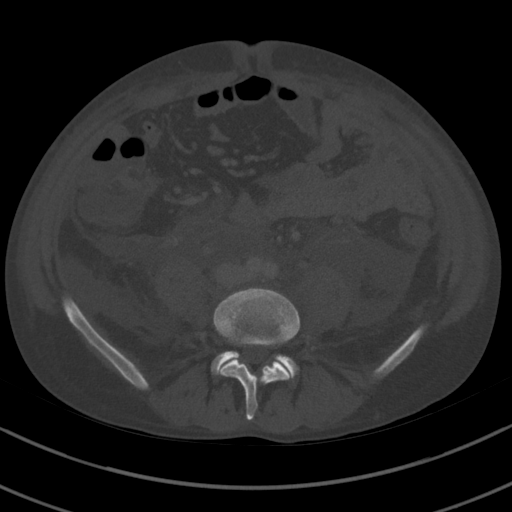
[im 21/93  soft-tissue]
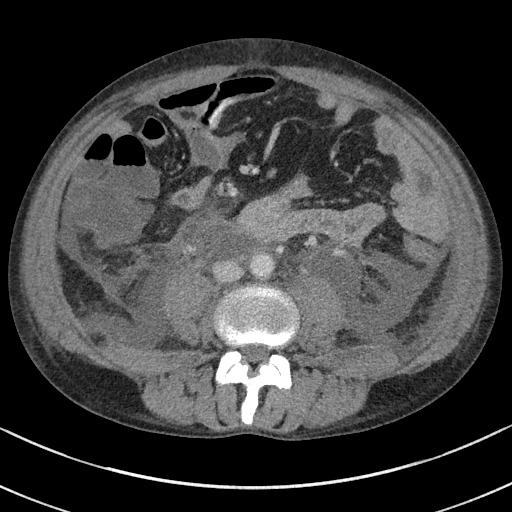
[im 31/93  soft-tissue]
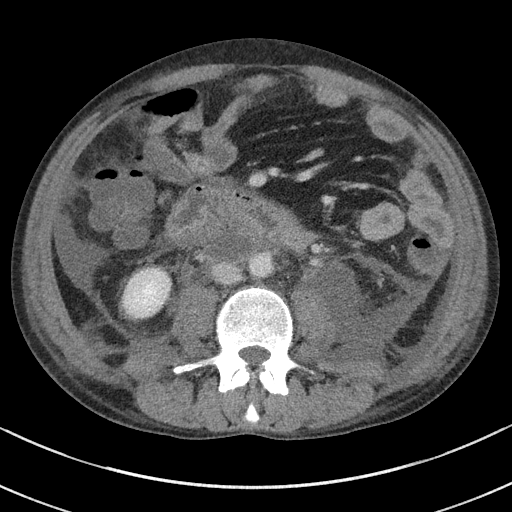
[im 41/93  soft-tissue]
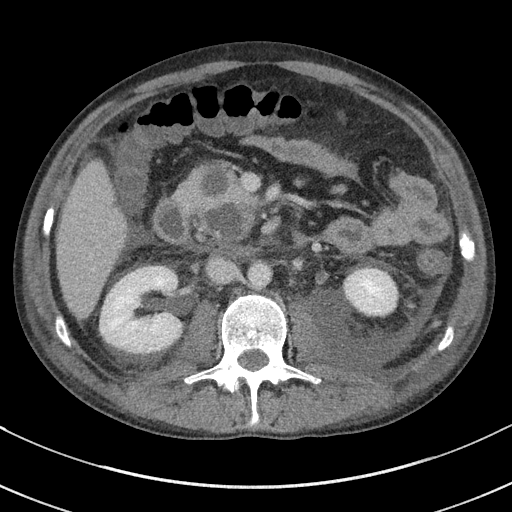
[im 52/93  soft-tissue]
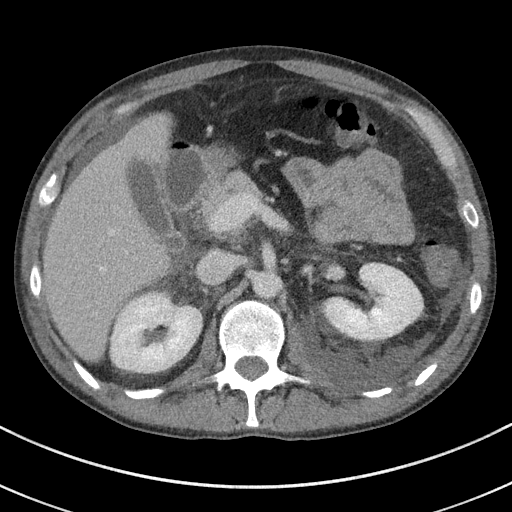
[im 52/93  lung]
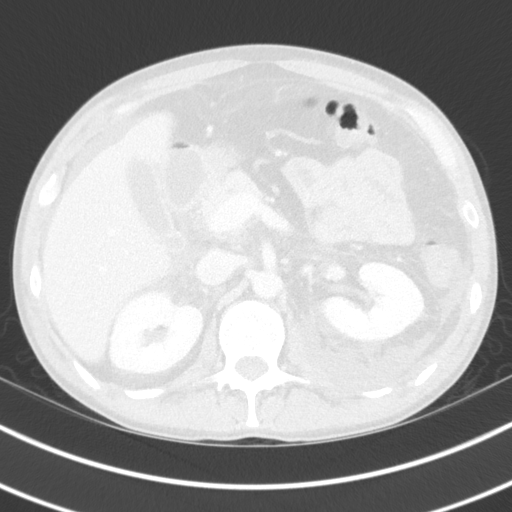
[im 62/93  soft-tissue]
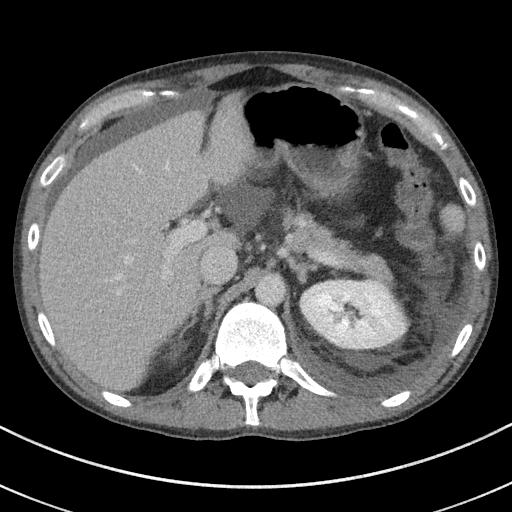
[im 62/93  lung]
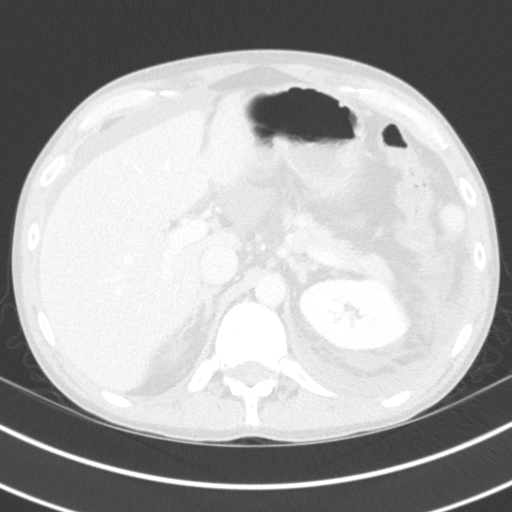
[im 72/93  soft-tissue]
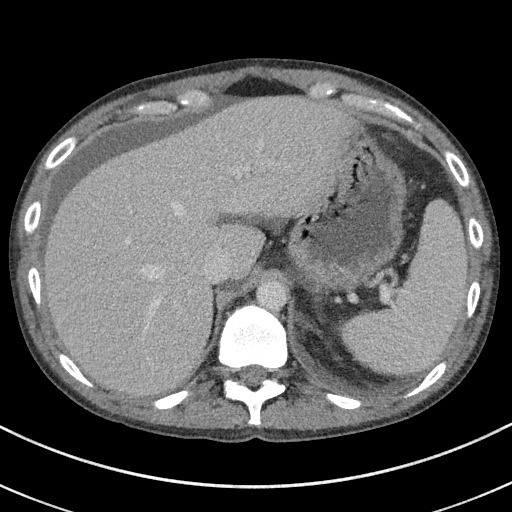
[im 72/93  lung]
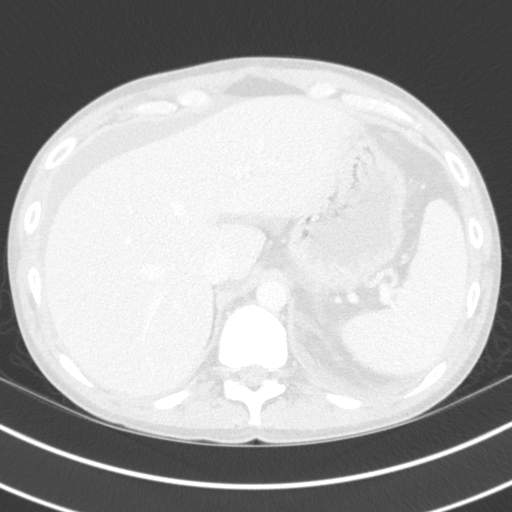
[im 82/93  soft-tissue]
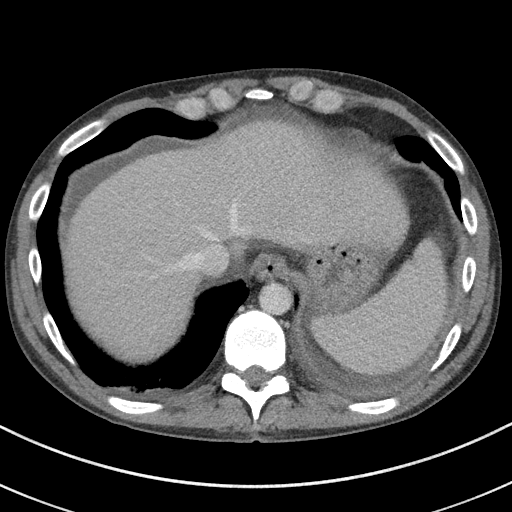
[im 82/93  lung]
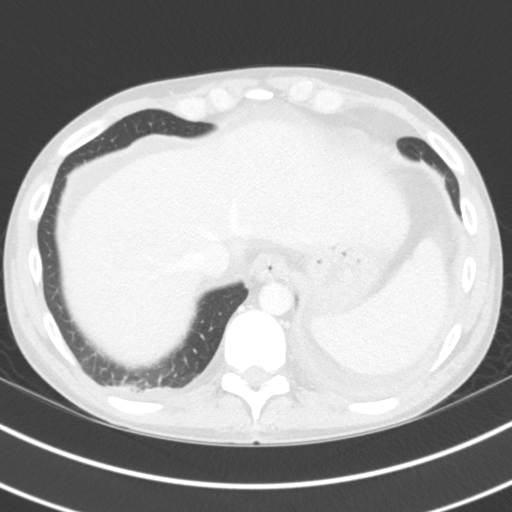

[Series 10: coronal arterial · coronal · arterial · 0.55mm/px · 3 of 90 slices shown]
[im 23/90  soft-tissue]
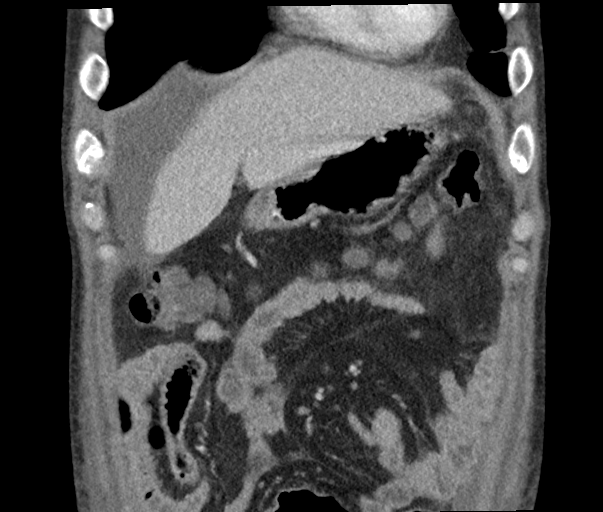
[im 45/90  soft-tissue]
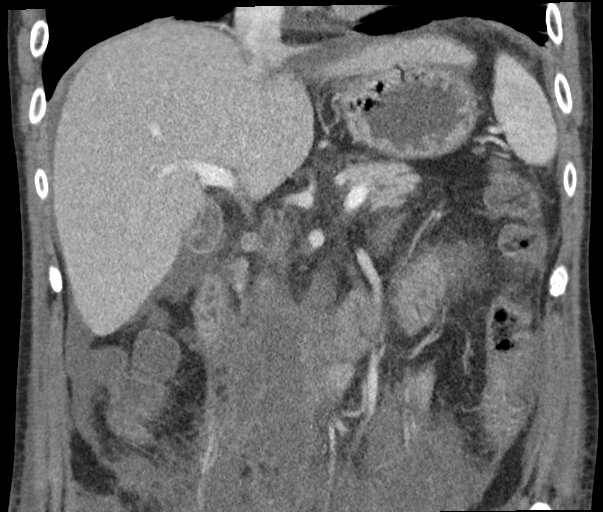
[im 67/90  soft-tissue]
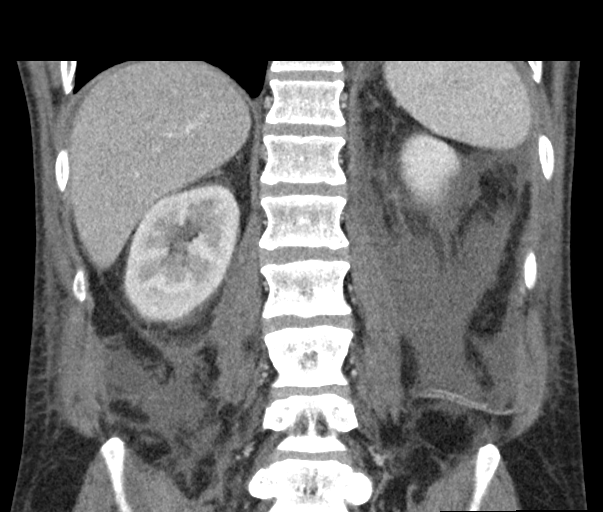

[11 of 46 positions shown; findings below may reference images not displayed]

FINDINGS: Lower chest: Trace RIGHT and small LEFT effusion with basilar
airspace disease, this is developed since the study [DATE].

Hepatobiliary: No focal, suspicious hepatic lesion. The portal vein
remains patent.

New perihepatic ascites since previous imaging. Gallbladder is
nondistended. Biliary tree is nondilated.

Pancreas: Pancreas with cystic areas in the head of the pancreas
largest measuring 2.8 cm, (image 49, series 4) unchanged since very
recent imaging. The the more posteriorly oriented cystic area in the
head of the pancreas adjacent to the main pancreatic duct 2.4 cm
greatest axial dimension previously 2.1 cm.

Mild main duct distension is similar to previous imaging.
Parenchymal enhancement is maintained largely throughout the
pancreas with the exception of the cystic areas.

In addition to ascites described above there is extensive stranding
in fluid throughout the retroperitoneum in the anterior and
posterior pararenal space. On the LEFT fluid measures approximately
2.3 cm greatest thickness in the posterior pararenal space and at
the sacral promontory 2.5 cm greatest thickness.

Slightly less fluid on the RIGHT. Fluid crosses the midline in the
vascular space of the retroperitoneum.

Ovoid collection posterior to the enlarging cystic area in the
pancreatic head and adjacent to the duodenum measuring 2.8 x 1.7 cm,
this area was not present on the previous study. This fluid
containing area is contiguous with changes in the retroperitoneum
described above.

Spleen: Normal splenic enhancement and normal size spleen. Splenic
vein remains patent.

Adrenals/Urinary Tract: Adrenal glands are normal.

Symmetric renal enhancement. No hydronephrosis though with mild
distension of the ureters and enhancement of the distal ureters in
the setting of extensive retroperitoneal inflammatory changes.

Stomach/Bowel: Duodenal inflammation. Generalized bowel edema in the
setting of new inflammation in the abdomen and new ascites.

Vascular/Lymphatic: Smooth contour of the IVC and aorta. The cystic
area in the high intra-aortocaval groove is immediately posterior to
the enlarging cystic area which appears to communicate with the
pancreas in the pancreatic head posteriorly as described. Splenic
vein, SMV portal vein remain patent.

Other: No free air. Signs of ascites new from previous imaging.
Extensive body wall edema also new from prior studies.

Musculoskeletal: No acute musculoskeletal process. Spinal
degenerative changes.
IMPRESSION: 1. Intense inflammation and fluid tracking from the posterior
pancreatic head into the vascular space and pararenal spaces of the
bilateral retroperitoneum, and associated with abdominal ascites.
2. Constellation of findings suspicious for rupture of necrotic
collection or enlarging pseudocyst in the head of the pancreas in
communication with the main pancreatic duct. Given rapid development
of the above process since the study [REDACTED], perhaps
related to acute on chronic pancreatitis and is likely associated
with ductal disruption and communication with the retroperitoneum.
3. Ureteral inflammation and mild dilation associated with intense
inflammation in the retroperitoneum and pancreatic fluid tracking
into these areas as described.
4. Secondary bowel edema in the setting of anasarca and developing
ascites.
5. Aortic atherosclerosis.

These results will be called to the ordering clinician or
representative by the Radiologist Assistant, and communication
documented in the PACS or [REDACTED].

Aortic Atherosclerosis ([3Q]-[3Q]).

## 2021-01-01 MED ORDER — SODIUM CHLORIDE 0.9 % IV SOLN
1.0000 g | Freq: Three times a day (TID) | INTRAVENOUS | Status: DC
  Administered 2021-01-01 – 2021-01-10 (×26): 1 g via INTRAVENOUS
  Filled 2021-01-01 (×30): qty 1

## 2021-01-01 MED ORDER — IOHEXOL 300 MG/ML  SOLN
100.0000 mL | Freq: Once | INTRAMUSCULAR | Status: AC | PRN
  Administered 2021-01-01: 100 mL via INTRAVENOUS

## 2021-01-01 MED ORDER — SODIUM CHLORIDE 0.9 % IV SOLN: 500.0000 mg | Freq: Three times a day (TID) | INTRAVENOUS | Status: DC

## 2021-01-01 NOTE — Progress Notes (Signed)
GI In-Patient Progress Note  CT scan this afternoon showed progressive peripancreatic inflammatory response with fluid tracking into the retroperitoneum. Concern raised for possible main pancreatic duct disruption vs rupture of necrotic collection.   Discussed case with Dr. Norma Fredrickson and I have placed orders for STAT diagnostic paracentesis with labs. We weill start IV Meropenem 500 mg q8h.   Follow up on results of Paracentesis    Jacob Moores, PA-C [facility] No Charge

## 2021-01-01 NOTE — Plan of Care (Signed)

## 2021-01-01 NOTE — Progress Notes (Addendum)
GI Inpatient Follow-up Note  Subjective:  Patient seen in follow-up for acute on chronic pancreatitis. No acute overnight events. Pt reports he is feeling better that yesterday. He just complains of "general soreness" today rated 4/10 in severity. No nausea or vomiting. He has had two formed, non-bloody and non-melanotic stools in last 24 hours. WBC still elevated 18 this morning. He did have low grade fever this AM with Tmax 100.6. CXR performed this morning showed no pleural effusion and improvement in infiltrate. Lipase trending down. No new complaints.   Scheduled Inpatient Medications:  . ascorbic acid  500 mg Oral BID  . diphenhydrAMINE  25 mg Oral Q6H  . DULoxetine  20 mg Oral Daily  . metoprolol tartrate  50 mg Oral BID  . pantoprazole (PROTONIX) IV  40 mg Intravenous Q24H  . rivaroxaban  20 mg Oral Q supper  . vitamin A  10,000 Units Oral Daily  . [START ON 01/04/2021] Vitamin D (Ergocalciferol)  50,000 Units Oral Q7 days  . zinc sulfate  220 mg Oral Daily    Continuous Inpatient Infusions:   . lactated ringers 200 mL/hr at 01/01/21 0419    PRN Inpatient Medications:  acetaminophen, ondansetron **OR** ondansetron (ZOFRAN) IV  Review of Systems: Constitutional: Weight is stable.  Eyes: No changes in vision. ENT: No oral lesions, sore throat.  GI: see HPI.  Heme/Lymph: No easy bruising.  CV: No chest pain.  GU: No hematuria.  Integumentary: No rashes.  Neuro: No headaches.  Psych: No depression/anxiety.  Endocrine: No heat/cold intolerance.  Allergic/Immunologic: No urticaria.  Resp: No cough, SOB.  Musculoskeletal: No joint swelling.    Physical Examination: BP (!) 143/94 (BP Location: Left Arm)   Pulse 87   Temp 98.7 F (37.1 C) (Oral)   Resp 16   Ht 6\' 1"  (1.854 m)   Wt 63.5 kg   SpO2 100%   BMI 18.47 kg/m  Gen: NAD, alert and oriented x 4 HEENT: PEERLA, EOMI, Neck: supple, no JVD or thyromegaly Chest: CTA bilaterally, no wheezes, crackles, or other  adventitious sounds CV: RRR, no m/g/c/r Abd: soft, +BS in all four quadrants; no tenderness to light palpation, only tenderness to deep palpation in epigastrium, no HSM, guarding, ridigity, or rebound tenderness Ext: no edema, well perfused with 2+ pulses, Skin: no rash or lesions noted Lymph: no LAD  Data: Lab Results  Component Value Date   WBC 18.0 (H) 01/01/2021   HGB 12.0 (L) 01/01/2021   HCT 34.3 (L) 01/01/2021   MCV 91.5 01/01/2021   PLT 146 (L) 01/01/2021   Recent Labs  Lab 12/30/20 1113 12/31/20 0332 01/01/21 0548  HGB 12.7* 13.1 12.0*   Lab Results  Component Value Date   NA 136 12/30/2020   K 3.8 12/30/2020   CL 103 12/30/2020   CO2 24 12/30/2020   BUN 10 12/30/2020   CREATININE 0.66 12/30/2020   Lab Results  Component Value Date   ALT 18 12/29/2020   AST 21 12/29/2020   ALKPHOS 56 12/29/2020   BILITOT 0.5 12/29/2020   No results for input(s): APTT, INR, PTT in the last 168 hours.   CT abd/pelvis with contrast 12/25/2020: IMPRESSION: 1. 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 No evidence of pancreatic necrosis.     Assessment:  1. Leukocytosis - Persists, considered as a marker  for continued pancreatitis. He has been ordered studies to rule out other sources of infection (blood culture, urine culture, chest x-ray, etc)  2. Acute pancreatitis - Reportedly hx of Chronic pancreatitis from alcohol abuse. Patient reportedly abstinent for 6-7 months.   Plan:  1. Strictly NPO 2. Plan for repeat CT with pancreatic protocol given persistent leukocytosis and low-grade fever this morning 3. Continue IV fluid hydration 4. Continue pain control as needed 3. Follow up on results of CT scan this afternoon  Please call with questions or concerns.    Jacob Moores, PA-C [facility] [phone] (Cell)

## 2021-01-01 NOTE — Progress Notes (Signed)
01/01/21  Called by Dr. Marylu Lund about this patient and his new CT scan results.  Has been admitted since 2/19 with acute pancreatitis.  Had previous CT scan on 2/15 when he also presented to the ED but was discharged home.  He's been having low grade temperature and elevated WBC.  CT scan was repeated today showing worsening of peripancreatic inflammatory response, with fluid tracking in the retroperitoneum.  Concern is raised for possible main pancreatic duct disruption vs rupture of necrotic collection.  Viewing the CT scan, there's significant peripancreatic stranding and fluid extending to paracolic gutters in the retroperitoneum.  Though I cannot evaluate for possible ductal disruption, would recommend discussing with GI to see if he would need MRCP vs ERCP.  If there is any need for OR surgery, would then recommend transferring him to tertiary center.  Henrene Dodge, MD

## 2021-01-01 NOTE — Progress Notes (Signed)
PROGRESS NOTE    Shane Sandler Sr.  WCB:[id number] DOB: August 03, 1986 DOA: 12/29/2020 PCP: [name], MD    Brief Narrative:  Shane Sergeant Sr. is a 35 y.o. 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  He describes the pain as bandlike across his abdomen and radiating to the back associated with nausea but no vomiting.  He has had multiple episodes of pancreatitis related to alcohol use in the past but states that he has abstain from further alcohol use since his last hospitalization about a year ago.  He was seen in the emergency room about 5 days ago for abdominal pain and was diagnosed with mild pancreatitis and discharged home with a prescription for oxycodone.  He was told to be on a liquid diet and to return to the emergency room if his symptoms worsened.  Patient states that he took  2-3  tablets of the oxycodone 10 mg and the next day developed a generalized erythematous macular rash which is nonpruritic.  He stopped taking the oxycodone but the rash has persisted.  2/20- lipase elevated 2106, covid negative. Reports rash better but still persistent. 2/22-low grade fever this am. Tmax 100.6   Consultants:   GI  Procedures:   Antimicrobials:       Subjective: Feels rash has improved.  Denies any chills, mild abdominal soreness, no nausea vomiting  Objective: Vitals:   01/01/21 0339 01/01/21 0631 01/01/21 0727 01/01/21 1146  BP: 139/86  (!) 143/94 134/82  Pulse: 95  87 93  Resp: 17  16 16   Temp: (!) 100.6 F (38.1 C) 98.2 F (36.8 C) 98.7 F (37.1 C) 99.9 F (37.7 C)  TempSrc:  Oral Oral Oral  SpO2: 100%  100% 99%  Weight:      Height:        Intake/Output Summary (Last 24 hours) at 01/01/2021 1323 Last data filed at  12/31/2020 2001 Gross per 24 hour  Intake 120 ml  Output 350 ml  Net -230 ml   Filed Weights   12/29/20 0732  Weight: 63.5 kg    Examination: NAD, calm CTA no wheeze rales rhonchi's Regular S1-S2 no murmurs rubs gallops Soft mildly tender in epigastric area, positive bowel sounds No edema Alert oriented x3 Skin rash has improved with minimal residual    Data Reviewed: I have personally reviewed following labs and imaging studies  CBC: Recent Labs  Lab 12/29/20 0746 12/30/20 0415 12/30/20 1113 12/31/20 0332 01/01/21 0548  WBC 12.0* 12.4* 15.3* 18.9* 18.0*  HGB 13.8 12.8* 12.7* 13.1 12.0*  HCT 40.9 37.4* 37.2* 37.9* 34.3*  MCV 93.4 92.1 91.9 92.4 91.5  PLT 164 162 150 150 146*   Basic Metabolic Panel: Recent Labs  Lab 12/25/20 1641 12/29/20 0746 12/30/20 0415  NA 136 138 136  K 4.2 4.4 3.8  CL 101 104 103  CO2 25 21* 24  GLUCOSE 103* 113* 121*  BUN 14 15 10   CREATININE 0.81 0.84 0.66  CALCIUM 9.4 9.0 8.3*   GFR: Estimated Creatinine Clearance: 116.9 mL/min (by C-G formula based on SCr of 0.66 mg/dL). Liver Function Tests: Recent Labs  Lab 12/25/20 1641 12/29/20 0746  AST 17 21  ALT 15 18  ALKPHOS 72 56  BILITOT 0.5 0.5  PROT 6.9 6.2*  ALBUMIN 4.2 3.5   Recent Labs  Lab 12/25/20 1641 12/29/20 0746 12/31/20 0332 01/01/21 0548  LIPASE 242* 2,106* 622* 185*   No results for input(s): AMMONIA in the last 168 hours. Coagulation Profile: No results for input(s): INR, PROTIME in the last 168 hours. Cardiac Enzymes: No results for input(s): CKTOTAL, CKMB, CKMBINDEX, TROPONINI in the last 168 hours. BNP (last 3 results) No results for input(s): PROBNP in the last 8760 hours. HbA1C: No results for input(s): HGBA1C in the last 72 hours. CBG: No results for input(s): GLUCAP in the last 168 hours. Lipid Profile: No results for input(s): CHOL, HDL, LDLCALC, TRIG, CHOLHDL, LDLDIRECT in the last 72 hours. Thyroid Function Tests: No results for  input(s): TSH, T4TOTAL, FREET4, T3FREE, THYROIDAB in the last 72 hours. Anemia Panel: No results for input(s): VITAMINB12, FOLATE, FERRITIN, TIBC, IRON, RETICCTPCT in the last 72 hours. Sepsis Labs: No results for input(s): PROCALCITON, LATICACIDVEN in the last 168 hours.  Recent Results (from the past 240 hour(s))  SARS CORONAVIRUS 2 (TAT 6-24 HRS) Nasopharyngeal Nasopharyngeal Swab     Status: None   Collection Time: 12/29/20 11:24 AM   Specimen: Nasopharyngeal Swab  Result Value Ref Range Status   SARS Coronavirus 2 NEGATIVE NEGATIVE Final    Comment: (NOTE) SARS-CoV-2 target nucleic acids are NOT DETECTED.  The SARS-CoV-2 RNA is generally detectable in upper and lower respiratory specimens during the acute phase of infection. Negative results do not preclude SARS-CoV-2 infection, do not rule out co-infections with other pathogens, and should not be used as the sole basis for treatment or other patient management decisions. Negative results must be combined with clinical observations, patient history, and epidemiological information. The expected result is Negative.  Fact Sheet for Patients: HairSlick.no  Fact Sheet for Healthcare Providers: quierodirigir.com  This test is not yet approved or cleared by the [place] FDA and  has been authorized for detection and/or diagnosis of SARS-CoV-2 by FDA under an Emergency Use Authorization (EUA). This EUA will remain  in effect (meaning this test can be used) for the duration of the COVID-19 declaration under Se ction 564(b)(1) of the Act, 21 U.S.C. section 360bbb-3(b)(1), unless the authorization is terminated or revoked sooner.  Performed at [facility], [address] [place], [place] [address]          Radiology Studies: DG Chest Port 1 View  Result Date: 01/01/2021 CLINICAL DATA:  Fever.  History of pancreatitis. EXAM: PORTABLE CHEST 1 VIEW COMPARISON:   Chest x-ray 05/22/2020. FINDINGS: Mediastinum and hilar structures normal. Heart size normal. Interim significant improvement in left base infiltrate with mild residual. No prominent pleural effusion noted on today's exam. No pneumothorax. IMPRESSION: Interim significant improvement in left base infiltrate with mild residual. No prominent pleural effusion noted on today's exam Electronically Signed   By: Maisie Fus  Register   On: 01/01/2021 08:30        Scheduled Meds: . ascorbic acid  500 mg Oral BID  . diphenhydrAMINE  25 mg Oral Q6H  . DULoxetine  20 mg Oral Daily  . metoprolol tartrate  50 mg Oral BID  . pantoprazole (PROTONIX) IV  40 mg Intravenous Q24H  . rivaroxaban  20 mg Oral Q supper  . vitamin A  10,000 Units Oral Daily  . [START ON 01/04/2021] Vitamin D (Ergocalciferol)  50,000 Units Oral Q7 days  . zinc sulfate  220 mg Oral Daily   Continuous Infusions: . lactated ringers 200 mL/hr at 01/01/21 [phone]  Assessment & Plan:   Principal Problem:   Acute on chronic pancreatitis (HCC) Active Problems:   Nicotine dependence   Acute on chronic pancreatitis Patient has a history of chronic alcoholic pancreatitis and presents to the ER for evaluation of worsening abdominal pain mostly in the periumbilical area with radiation to the back associated with nausea. Last drink one year ago Lipase is elevated at 2106 2/22-clinically improving however had a low-grade fever this AM. Still with leukocytosis Started on clears GI following-discussed fever and leukocytosis with Dr. 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 But persisting.  woundering if allergic to morphine too. Discussed with wife and pt about stopping morphine and using tylenolol , they are agreeable. 2/22-improving with standing dose of benadryl. Will continue one more day then stop in am.  DVT prophylaxis: [place] Ramp Code Status: Full Family Communication: Wife at bedside  Status is: Inpatient  Remains inpatient appropriate because:Inpatient level of care appropriate due to severity of illness   Dispo: The patient is from: Home              Anticipated d/c is to: Home              Anticipated d/c date is: 2 days              Patient currently is not medically stable to d/c.febrile today . Obtaining ct scan.   Difficult to place patient No            LOS: 3 days   Time spent: 35 minutes with more than 50% on COC    Lynn Ito, MD [facility] Pager [phone]

## 2021-01-02 ENCOUNTER — Inpatient Hospital Stay: Payer: Medicaid Other

## 2021-01-02 DIAGNOSIS — K859 Acute pancreatitis without necrosis or infection, unspecified: Secondary | ICD-10-CM | POA: Diagnosis not present

## 2021-01-02 DIAGNOSIS — K861 Other chronic pancreatitis: Secondary | ICD-10-CM | POA: Diagnosis not present

## 2021-01-02 LAB — CBC
HCT: 32.1 % — ABNORMAL LOW (ref 39.0–52.0)
Hemoglobin: 10.9 g/dL — ABNORMAL LOW (ref 13.0–17.0)
MCH: 31.1 pg (ref 26.0–34.0)
MCHC: 34 g/dL (ref 30.0–36.0)
MCV: 91.7 fL (ref 80.0–100.0)
Platelets: 168 K/uL (ref 150–400)
RBC: 3.5 MIL/uL — ABNORMAL LOW (ref 4.22–5.81)
RDW: 12.5 % (ref 11.5–15.5)
WBC: 16.3 K/uL — ABNORMAL HIGH (ref 4.0–10.5)
nRBC: 0 % (ref 0.0–0.2)

## 2021-01-02 LAB — BASIC METABOLIC PANEL WITH GFR
Anion gap: 10 (ref 5–15)
BUN: 5 mg/dL — ABNORMAL LOW (ref 6–20)
CO2: 25 mmol/L (ref 22–32)
Calcium: 7.7 mg/dL — ABNORMAL LOW (ref 8.9–10.3)
Chloride: 101 mmol/L (ref 98–111)
Creatinine, Ser: 0.61 mg/dL (ref 0.61–1.24)
GFR, Estimated: >60 mL/min (ref >60)
Glucose, Bld: 113 mg/dL — ABNORMAL HIGH (ref 70–99)
Potassium: 3.2 mmol/L — ABNORMAL LOW (ref 3.5–5.1)
Sodium: 136 mmol/L (ref 135–145)

## 2021-01-02 LAB — URINE CULTURE: Culture: <10000 — AB

## 2021-01-02 LAB — LIPASE, BLOOD: Lipase: 79 U/L — ABNORMAL HIGH (ref 11–51)

## 2021-01-02 IMAGING — US US ABDOMEN LIMITED RUQ/ASCITES
1 series · 4 of 4 positions shown · non-contrast
Comparison: None.

CLINICAL DATA: 34-year-old male with a history of pancreatitis

EXAM:
LIMITED ABDOMEN ULTRASOUND FOR ASCITES
TECHNIQUE: Limited ultrasound survey for ascites was performed in all four
abdominal quadrants.

[Series 1: us paracentesis · 4 of 4 slices shown]
[im 1/4]
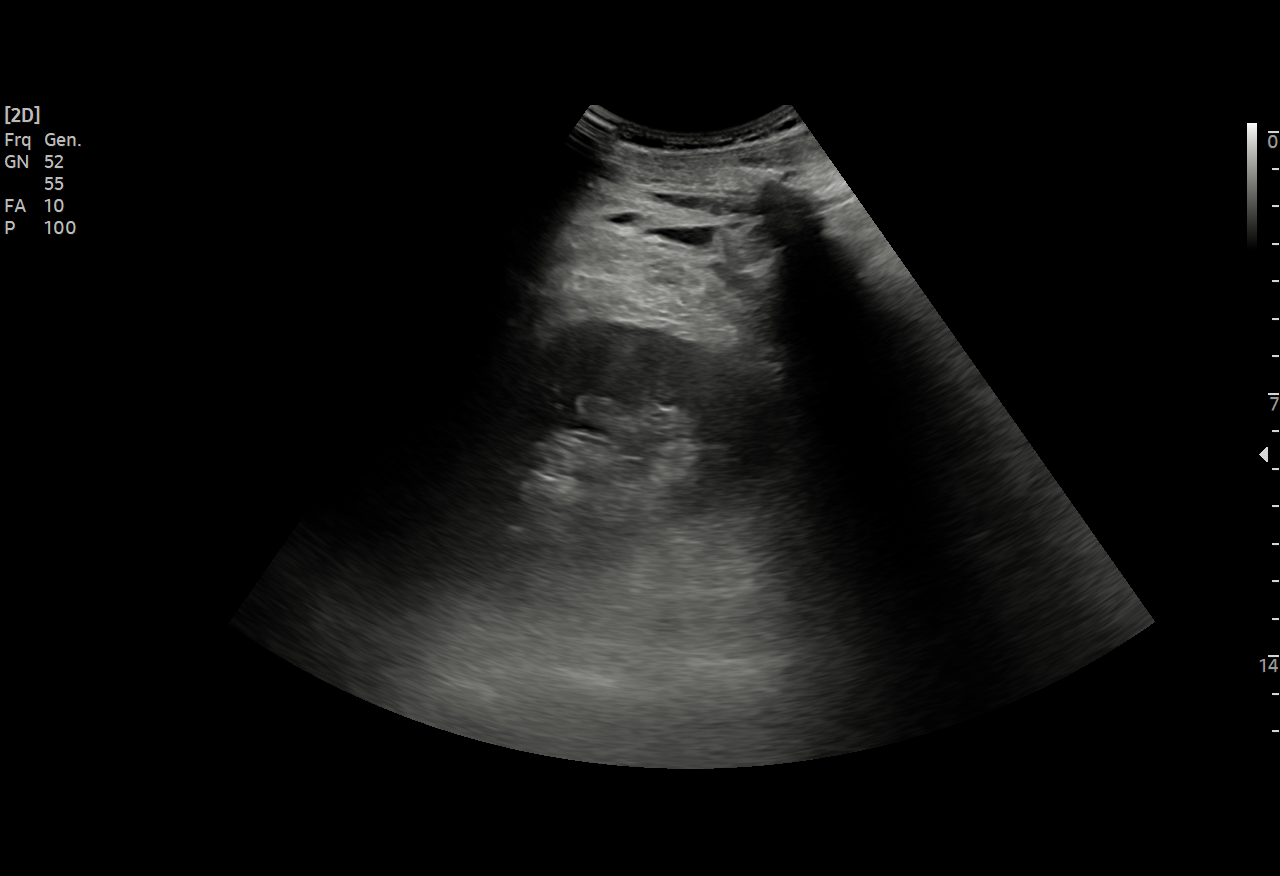
[im 2/4]
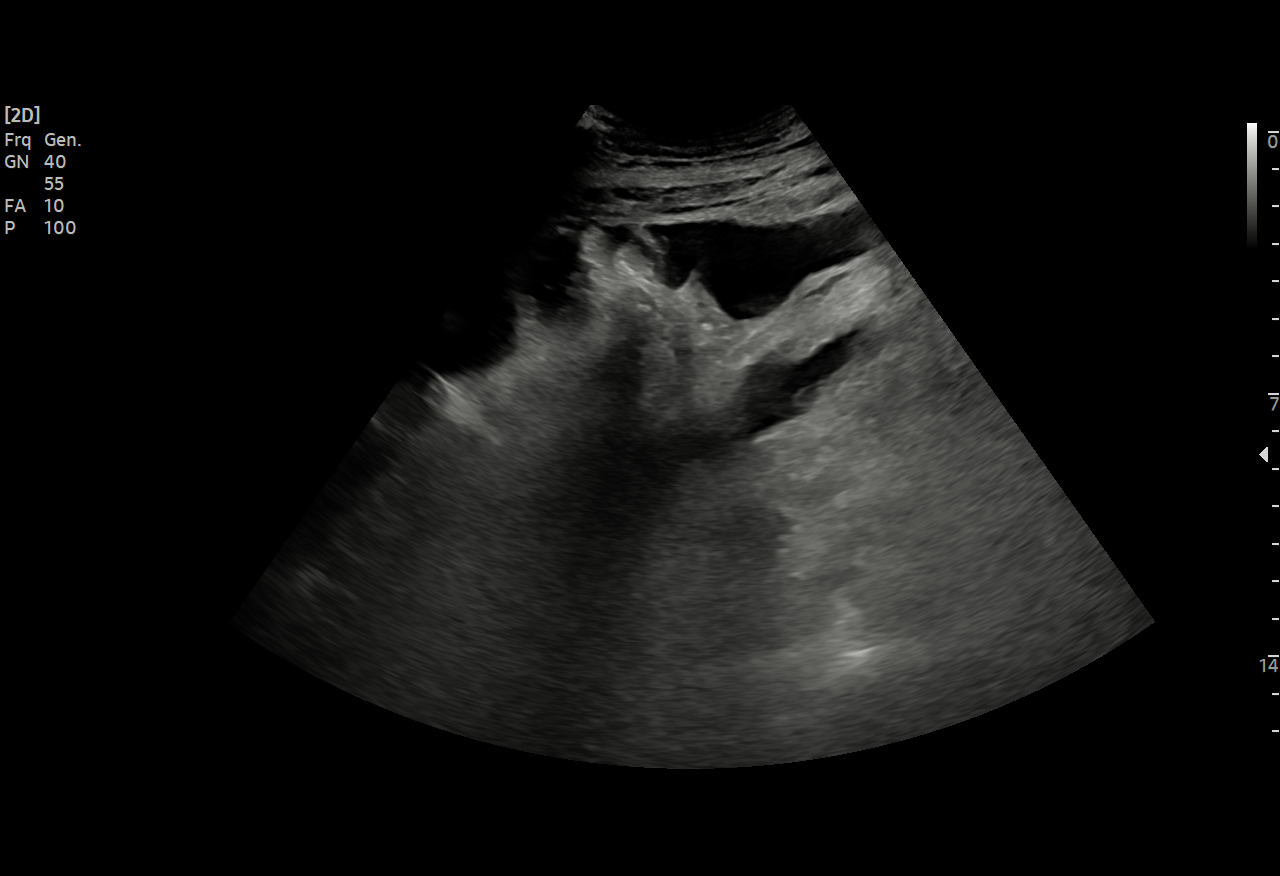
[im 3/4]
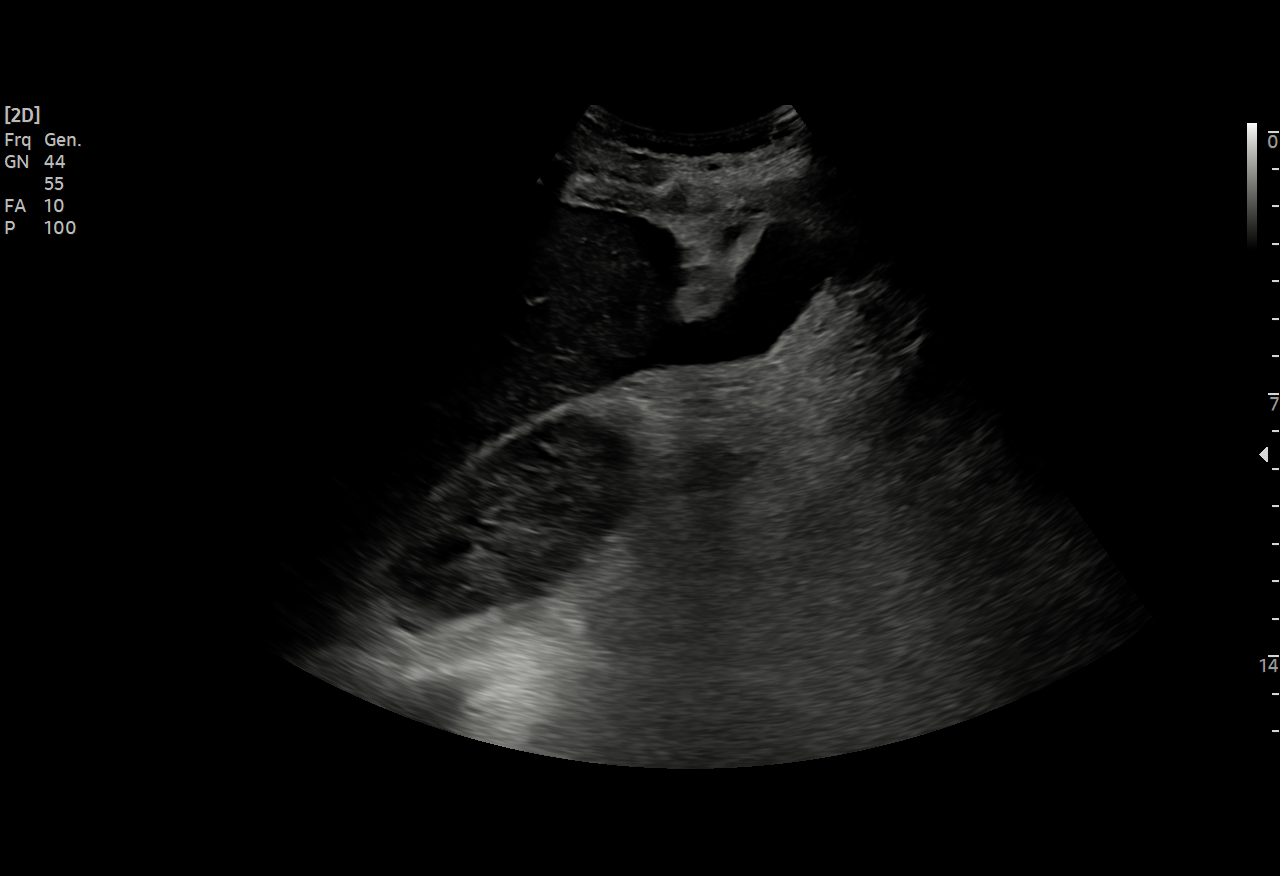
[im 4/4]
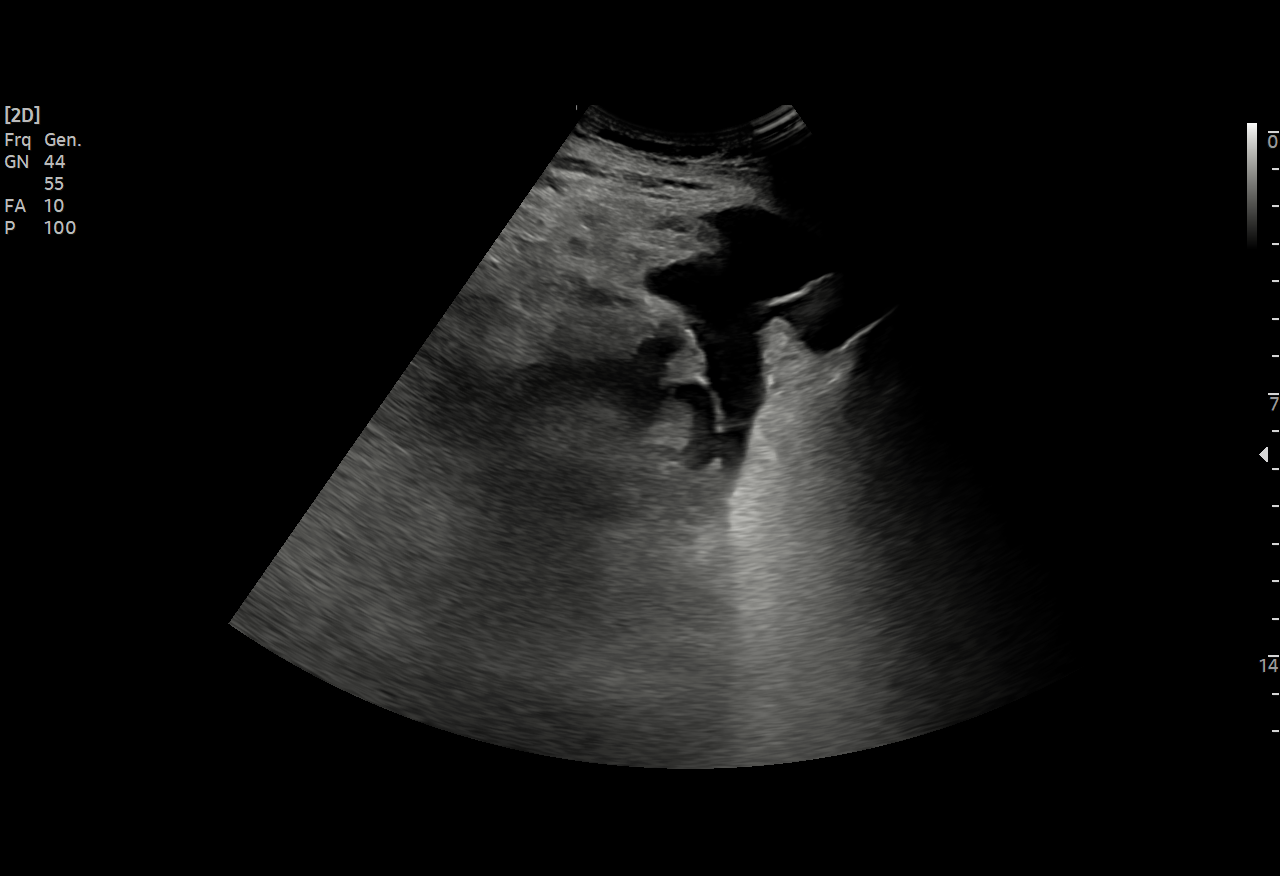

[4 of 4 positions shown; findings below may reference images not displayed]

FINDINGS: Scant ultrasound within the abdomen, layer dependently in the
pelvis. No adequate window for paracentesis attempt.
IMPRESSION: Scant ascites.

## 2021-01-02 MED ORDER — POTASSIUM CHLORIDE 10 MEQ/100ML IV SOLN
10.0000 meq | INTRAVENOUS | Status: AC
Start: 2021-01-02 — End: 2021-01-02
  Administered 2021-01-02 (×4): 10 meq via INTRAVENOUS
  Filled 2021-01-02 (×5): qty 100

## 2021-01-02 MED ORDER — DIPHENHYDRAMINE HCL 25 MG PO CAPS
25.0000 mg | ORAL_CAPSULE | Freq: Four times a day (QID) | ORAL | Status: DC | PRN
  Administered 2021-01-02: 25 mg via ORAL
  Filled 2021-01-02: qty 1

## 2021-01-02 NOTE — Plan of Care (Signed)

## 2021-01-02 NOTE — Progress Notes (Signed)
PROGRESS NOTE    Shane Suydam Sr.  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 He describes the pain as bandlike across his abdomen and radiating to the back associated with nausea but no vomiting. He hashad multiple episodes of pancreatitis related to alcohol use in the past but states that he hasabstain from further alcohol use since his last hospitalization about a year ago. He was seen in the emergency room about 5 days ago for abdominal pain and was diagnosed with mild pancreatitis and discharged home with a prescription for oxycodone. He was told to be on a liquid diet and to return to the emergency room if his symptoms worsened.Patient states that he took  2-3  tablets of the oxycodone 10 mg andthe next day developed a generalized erythematous macular rash which is nonpruritic. He stopped taking the oxycodone but the rash has persisted.  2/20- lipase elevated 2106, covid negative. Reports rash better but still persistent. 2/22-low grade fever this am. Tmax 100.6 2/23: Improved pain control.  Continues to spike low-grade temperatures.  100.5 noted on 2/22 at 1956.  Patient still well.  Endorses hunger.  Remains NPO.  Diagnostic paracentesis cannot be performed due to lack of safe window.   Assessment & Plan:   Principal Problem:   Acute on chronic pancreatitis (HCC) Active Problems:   Nicotine dependence  Acute on chronic pancreatitis History of chronic alcoholic pancreatitis Not actively drinking, last drink 1 year ago Lipase initially 2106, now 80 Repeat CAT scan raised the possibility of pancreatic duct  disruption versus rupture of peripancreatic necrotic area Diagnostic paracentesis ordered however could not be performed due to lack of safe window Started on meropenem per GI recommendations Plan: Continue meropenem Aggressive IV fluids Continue n.p.o. per GI recommendations As needed pain control Daily labs Follow any further GI recommendations ??  Need for ERCP  Nicotine dependence Counseling offered during this admission Pt declined nicotine patch  History of dural venous sinus thrombosis Chronic anticoagulation Continue Xarelto  Skin rash Improving Occurred after narcotic administration Plan: As needed Benadryl Avoid narcotics  DVT prophylaxis: Xarelto Code Status: Full Family Communication: Wife at bedside Disposition Plan: Status is: Inpatient  Remains inpatient appropriate because:Inpatient level of care appropriate due to severity of illness   Dispo: The patient is from: Home              Anticipated d/c is to: Home              Anticipated d/c date is: 2 days              Patient currently is not medically stable to d/c.   Difficult to place patient No   Still leukocytosis though downtrending.  Lipase downtrending.  Inflammatory markers downtrending.  Complex presentation of acute on chronic pancreatitis.  Appreciate GI recommendations.    Level of care: Med-Surg  Consultants:   GI  Procedures:   None  Antimicrobials:   Meropenem   Subjective: Seen and examined.  Improved rash.  Pain control improved.  No other issues.  Objective: Vitals:   01/01/21 1926 01/01/21 2322 01/02/21 0732 01/02/21 0911  BP: (!) 142/82 133/88 139/84 (!) 147/83  Pulse:  99 89 (!) 101 (!) 101  Resp: 17 17 16 16   Temp: (!) 100.5 F (38.1 C) 98.3 F (36.8 C) 99.2 F (37.3 C)   TempSrc: Oral  Oral   SpO2: 100% 98% 99% 98%  Weight:      Height:        Intake/Output Summary (Last 24 hours) at 01/02/2021 1119 Last data filed at 01/02/2021 0427 Gross per 24  hour  Intake 3467.46 ml  Output 400 ml  Net 3067.46 ml   Filed Weights   12/29/20 0732  Weight: 63.5 kg    Examination:  General exam: Appears calm and comfortable  Respiratory system: Clear to auscultation. Respiratory effort normal. Cardiovascular system: S1 & S2 heard, RRR. No JVD, murmurs, rubs, gallops or clicks. No pedal edema. Gastrointestinal system: Nondistended.  Tender to palpation left upper quadrant.  Normal bowel sounds Central nervous system: Alert and oriented. No focal neurological deficits. Extremities: Symmetric 5 x 5 power. Skin: No rashes, lesions or ulcers Psychiatry: Judgement and insight appear normal. Mood & affect appropriate.     Data Reviewed: I have personally reviewed following labs and imaging studies  CBC: Recent Labs  Lab 12/30/20 0415 12/30/20 1113 12/31/20 0332 01/01/21 0548 01/02/21 0357  WBC 12.4* 15.3* 18.9* 18.0* 16.3*  HGB 12.8* 12.7* 13.1 12.0* 10.9*  HCT 37.4* 37.2* 37.9* 34.3* 32.1*  MCV 92.1 91.9 92.4 91.5 91.7  PLT 162 150 150 146* 168   Basic Metabolic Panel: Recent Labs  Lab 12/29/20 0746 12/30/20 0415 01/02/21 0357  NA 138 136 136  K 4.4 3.8 3.2*  CL 104 103 101  CO2 21* 24 25  GLUCOSE 113* 121* 113*  BUN 15 10 5*  CREATININE 0.84 0.66 0.61  CALCIUM 9.0 8.3* 7.7*   GFR: Estimated Creatinine Clearance: 116.9 mL/min (by C-G formula based on SCr of 0.61 mg/dL). Liver Function Tests: Recent Labs  Lab 12/29/20 0746  AST 21  ALT 18  ALKPHOS 56  BILITOT 0.5  PROT 6.2*  ALBUMIN 3.5   Recent Labs  Lab 12/29/20 0746 12/31/20 0332 01/01/21 0548 01/02/21 0357  LIPASE 2,106* 622* 185* 79*   No results for input(s): AMMONIA in the last 168 hours. Coagulation Profile: No results for input(s): INR, PROTIME in the last 168 hours. Cardiac Enzymes: No results for input(s): CKTOTAL, CKMB, CKMBINDEX, TROPONINI in the last 168 hours. BNP (last 3 results) No results for input(s): PROBNP in the last 8760  hours. HbA1C: No results for input(s): HGBA1C in the last 72 hours. CBG: No results for input(s): GLUCAP in the last 168 hours. Lipid Profile: No results for input(s): CHOL, HDL, LDLCALC, TRIG, CHOLHDL, LDLDIRECT in the last 72 hours. Thyroid Function Tests: No results for input(s): TSH, T4TOTAL, FREET4, T3FREE, THYROIDAB in the last 72 hours. Anemia Panel: No results for input(s): VITAMINB12, FOLATE, FERRITIN, TIBC, IRON, RETICCTPCT in the last 72 hours. Sepsis Labs: No results for input(s): PROCALCITON, LATICACIDVEN in the last 168 hours.  Recent Results (from the past 240 hour(s))  SARS CORONAVIRUS 2 (TAT 6-24 HRS) Nasopharyngeal Nasopharyngeal Swab     Status: None   Collection Time: 12/29/20 11:24 AM   Specimen: Nasopharyngeal Swab  Result Value Ref Range Status   SARS Coronavirus 2 NEGATIVE NEGATIVE Final    Comment: (NOTE) SARS-CoV-2 target nucleic acids are NOT DETECTED.  The SARS-CoV-2 RNA is generally detectable in upper and lower respiratory specimens during the acute phase of infection. Negative results do not preclude SARS-CoV-2 infection, do not rule out co-infections  with other pathogens, and should not be used as the sole basis for treatment or other patient management decisions. Negative results must be combined with clinical observations, patient history, and epidemiological information. The expected result is Negative.  Fact Sheet for Patients: HairSlick.no  Fact Sheet for Healthcare Providers: quierodirigir.com  This test is not yet approved or cleared by the [place] FDA and  has been authorized for detection and/or diagnosis of SARS-CoV-2 by FDA under an Emergency Use Authorization (EUA). This EUA will remain  in effect (meaning this test can be used) for the duration of the COVID-19 declaration under Se ction 564(b)(1) of the Act, 21 U.S.C. section 360bbb-3(b)(1), unless the authorization is  terminated or revoked sooner.  Performed at [facility], [address], [place], [place] [address]   Urine Culture     Status: Abnormal   Collection Time: 01/01/21  9:07 AM   Specimen: Urine, Clean Catch  Result Value Ref Range Status   Specimen Description   Final    URINE, CLEAN CATCH Performed at [facility], [address] [place], [place] [address]    Special Requests   Final    NONE Performed at [facility], [address] [place], [place] [address]    Culture (A)  Final    <10,000 COLONIES/mL INSIGNIFICANT GROWTH Performed at [facility], [address] [place], [place] [address]    Report Status 01/02/2021 FINAL  Final  CULTURE, BLOOD (ROUTINE X 2) w Reflex to ID Panel     Status: None (Preliminary result)   Collection Time: 01/01/21  9:47 AM   Specimen: BLOOD  Result Value Ref Range Status   Specimen Description BLOOD LEFT ARM  Final   Special Requests   Final    BOTTLES DRAWN AEROBIC AND ANAEROBIC Blood Culture adequate volume   Culture   Final    NO GROWTH < 24 HOURS Performed at [facility], [address] [place], [place] [address]    Report Status PENDING  Incomplete  CULTURE, BLOOD (ROUTINE X 2) w Reflex to ID Panel     Status: None (Preliminary result)   Collection Time: 01/01/21  9:48 AM   Specimen: BLOOD  Result Value Ref Range Status   Specimen Description BLOOD  LEFT HAND  Final   Special Requests   Final    BOTTLES DRAWN AEROBIC AND ANAEROBIC Blood Culture adequate volume   Culture   Final    NO GROWTH < 24 HOURS Performed at [facility], [address] [place], [place] [address]    Report Status PENDING  Incomplete         Radiology Studies: DG Chest Port 1 View  Result Date: 01/01/2021 CLINICAL DATA:  Fever.  History of pancreatitis. EXAM: PORTABLE CHEST 1 VIEW COMPARISON:  Chest x-ray 05/22/2020. FINDINGS: Mediastinum and hilar structures normal. Heart size normal. Interim  significant improvement in left base infiltrate with mild residual. No prominent pleural effusion noted on today's exam. No pneumothorax. IMPRESSION: Interim significant improvement in left base infiltrate with mild residual. No prominent pleural effusion noted on today's exam Electronically Signed   By: Maisie Fus  Register   On: 01/01/2021 08:30   [place] ASCITES (ABDOMEN LIMITED)  Result Date: 01/02/2021 CLINICAL DATA:  36 year old male with a history of pancreatitis EXAM: LIMITED ABDOMEN ULTRASOUND FOR ASCITES TECHNIQUE: Limited ultrasound survey for ascites was performed in all four abdominal quadrants. COMPARISON:  None. FINDINGS: Scant ultrasound within the abdomen, layer dependently  in the pelvis. No adequate window for paracentesis attempt. IMPRESSION: Scant ascites. Electronically Signed   By: Gilmer Mor D.O.   On: 01/02/2021 09:33   CT PANCREAS ABD W/WO  Result Date: 01/01/2021 CLINICAL DATA:  History of pancreatitis, necrotizing pancreatitis with fever and white blood cell count. EXAM: CT ABDOMEN WITHOUT AND WITH CONTRAST TECHNIQUE: Multidetector CT imaging of the abdomen was performed following the standard protocol before and following the bolus administration of intravenous contrast. CONTRAST:  OMNIPAQUE IOHEXOL 300 MG/ML  SOLN COMPARISON:  December 25, 2020 FINDINGS: Lower chest: Trace RIGHT and small LEFT effusion with basilar airspace disease, this is developed since the study of December 25, 2020. Hepatobiliary: No focal, suspicious hepatic lesion. The portal vein remains patent. New perihepatic ascites since previous imaging. Gallbladder is nondistended. Biliary tree is nondilated. Pancreas: Pancreas with cystic areas in the head of the pancreas largest measuring 2.8 cm, (image 49, series 4) unchanged since very recent imaging. The the more posteriorly oriented cystic area in the head of the pancreas adjacent to the main pancreatic duct 2.4 cm greatest axial dimension previously 2.1 cm.  Mild main duct distension is similar to previous imaging. Parenchymal enhancement is maintained largely throughout the pancreas with the exception of the cystic areas. In addition to ascites described above there is extensive stranding in fluid throughout the retroperitoneum in the anterior and posterior pararenal space. On the LEFT fluid measures approximately 2.3 cm greatest thickness in the posterior pararenal space and at the sacral promontory 2.5 cm greatest thickness. Slightly less fluid on the RIGHT. Fluid crosses the midline in the vascular space of the retroperitoneum. Ovoid collection posterior to the enlarging cystic area in the pancreatic head and adjacent to the duodenum measuring 2.8 x 1.7 cm, this area was not present on the previous study. This fluid containing area is contiguous with changes in the retroperitoneum described above. Spleen: Normal splenic enhancement and normal size spleen. Splenic vein remains patent. Adrenals/Urinary Tract: Adrenal glands are normal. Symmetric renal enhancement. No hydronephrosis though with mild distension of the ureters and enhancement of the distal ureters in the setting of extensive retroperitoneal inflammatory changes. Stomach/Bowel: Duodenal inflammation. Generalized bowel edema in the setting of new inflammation in the abdomen and new ascites. Vascular/Lymphatic: Smooth contour of the IVC and aorta. The cystic area in the high intra-aortocaval groove is immediately posterior to the enlarging cystic area which appears to communicate with the pancreas in the pancreatic head posteriorly as described. Splenic vein, SMV portal vein remain patent. Other: No free air. Signs of ascites new from previous imaging. Extensive body wall edema also new from prior studies. Musculoskeletal: No acute musculoskeletal process. Spinal degenerative changes. IMPRESSION: 1. Intense inflammation and fluid tracking from the posterior pancreatic head into the vascular space and  pararenal spaces of the bilateral retroperitoneum, and associated with abdominal ascites. 2. Constellation of findings suspicious for rupture of necrotic collection or enlarging pseudocyst in the head of the pancreas in communication with the main pancreatic duct. Given rapid development of the above process since the study of February 15th, perhaps related to acute on chronic pancreatitis and is likely associated with ductal disruption and communication with the retroperitoneum. 3. Ureteral inflammation and mild dilation associated with intense inflammation in the retroperitoneum and pancreatic fluid tracking into these areas as described. 4. Secondary bowel edema in the setting of anasarca and developing ascites. 5. Aortic atherosclerosis. These results will be called to the ordering clinician or representative by the Radiologist Assistant, and communication  documented in the PACS or [employer] Dashboard. Aortic Atherosclerosis (ICD10-I70.0). Electronically Signed   By: Donzetta KohutGeoffrey  Wile M.D.   On: 01/01/2021 15:53        Scheduled Meds: . ascorbic acid  500 mg Oral BID  . diphenhydrAMINE  25 mg Oral Q6H  . DULoxetine  20 mg Oral Daily  . metoprolol tartrate  50 mg Oral BID  . pantoprazole (PROTONIX) IV  40 mg Intravenous Q24H  . rivaroxaban  20 mg Oral Q supper  . vitamin A  10,000 Units Oral Daily  . [START ON 01/04/2021] Vitamin D (Ergocalciferol)  50,000 Units Oral Q7 days  . zinc sulfate  220 mg Oral Daily   Continuous Infusions: . lactated ringers 200 mL/hr at 01/02/21 0305  . meropenem (MERREM) IV 1 g (01/02/21 0952)  . potassium chloride 10 mEq (01/02/21 1055)     LOS: 4 days    Time spent: 25 minutes    Tresa MooreSudheer B Mellie Buccellato, MD [facility] Pager [phone]  If 7PM-7AM, please contact night-coverage 01/02/2021, 11:19 AM

## 2021-01-02 NOTE — Progress Notes (Signed)
[facility] GI inpatient brief Progress Note  Patient seen for f/u worsening pancreatitis. 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 No gallbladder pathology on CT Abd/pelvis. Patient appears in NAD, says he is hungry, and wants to eat a meal.  Vitals:   01/02/21 0911 01/02/21 1147  BP: (!) 147/83 131/82  Pulse: (!) 101 97  Resp: 16 16  Temp:  99.8 F (37.7 C)  SpO2: 98%       Gen: NAD. Appears comfortable.  HEENT: [place]/AT. PERRLA. Normal external ear exam.  Chest: CTA, no wheezes.  CV: RR nl S1, S2. No gallops.  Abd: Distended, firm, moderately tender without rebound. BS hypoactive but present.   Ext: no edema. Pulses 2+  Neuro: Alert and oriented. Judgement appears normal. Nonfocal.     Impression:  1. Acute pancreatitis presumed secondary to chronic pancreatitis from alcohol abuse. Patient reportedly practices alcohol abstinence greater than 6 months. 2. Fever, on meropenam 3. Presumed pancreatic ascites, paracentesis unsuccessful due to no accessible pocket of fluid for sampling.   Plan:  1. I explained at length the need to remain NPO despite patient appearing stable; I explained my worries and explained the worsening data to both the patient and his domestic partner. 2. If not drastically improved by tomorrow, I proposed nasoenteric feedings through a Dobhoff tube. Patient appeared reluctant to pursue that "unless you knock me out for it". I told the patient it is an unsedated placement and would need to remain in possibly for days depending on clinical response to therapy. Patient appeared to refuse. 3. Continue good maintenance IVF's, antibiotics, pain control. Following along with hyou  Call if needed.   Thank you  T. Bing Plume, M.D. ABIM Diplomate in Gastroenterology [facility] [phone] -  Cell

## 2021-01-02 NOTE — Progress Notes (Signed)
Interventional Radiology Progress Note  [place] for pending possible paracentesis.  Scant fluid in the pelvis, without adequate window. No para performed.   Signed,  Yvone Neu. Loreta Ave, DO

## 2021-01-03 ENCOUNTER — Inpatient Hospital Stay: Payer: Medicaid Other

## 2021-01-03 DIAGNOSIS — E440 Moderate protein-calorie malnutrition: Secondary | ICD-10-CM | POA: Insufficient documentation

## 2021-01-03 DIAGNOSIS — K859 Acute pancreatitis without necrosis or infection, unspecified: Secondary | ICD-10-CM | POA: Diagnosis not present

## 2021-01-03 DIAGNOSIS — K861 Other chronic pancreatitis: Secondary | ICD-10-CM | POA: Diagnosis not present

## 2021-01-03 LAB — CBC WITH DIFFERENTIAL/PLATELET
Abs Immature Granulocytes: 0.19 10*3/uL — ABNORMAL HIGH (ref 0.00–0.07)
Basophils Absolute: 0.1 10*3/uL (ref 0.0–0.1)
Basophils Relative: 0 %
Eosinophils Absolute: 1.9 10*3/uL — ABNORMAL HIGH (ref 0.0–0.5)
Eosinophils Relative: 11 %
HCT: 32 % — ABNORMAL LOW (ref 39.0–52.0)
Hemoglobin: 10.9 g/dL — ABNORMAL LOW (ref 13.0–17.0)
Immature Granulocytes: 1 %
Lymphocytes Relative: 9 %
Lymphs Abs: 1.5 10*3/uL (ref 0.7–4.0)
MCH: 31.2 pg (ref 26.0–34.0)
MCHC: 34.1 g/dL (ref 30.0–36.0)
MCV: 91.7 fL (ref 80.0–100.0)
Monocytes Absolute: 1.2 10*3/uL — ABNORMAL HIGH (ref 0.1–1.0)
Monocytes Relative: 7 %
Neutro Abs: 13.1 10*3/uL — ABNORMAL HIGH (ref 1.7–7.7)
Neutrophils Relative %: 72 %
Platelets: 181 10*3/uL (ref 150–400)
RBC: 3.49 MIL/uL — ABNORMAL LOW (ref 4.22–5.81)
RDW: 12.6 % (ref 11.5–15.5)
WBC: 17.9 10*3/uL — ABNORMAL HIGH (ref 4.0–10.5)
nRBC: 0 % (ref 0.0–0.2)

## 2021-01-03 LAB — COMPREHENSIVE METABOLIC PANEL
ALT: 14 U/L (ref 0–44)
AST: 20 U/L (ref 15–41)
Albumin: 2.1 g/dL — ABNORMAL LOW (ref 3.5–5.0)
Alkaline Phosphatase: 55 U/L (ref 38–126)
Anion gap: 11 (ref 5–15)
BUN: 6 mg/dL (ref 6–20)
CO2: 24 mmol/L (ref 22–32)
Calcium: 7.8 mg/dL — ABNORMAL LOW (ref 8.9–10.3)
Chloride: 100 mmol/L (ref 98–111)
Creatinine, Ser: 0.66 mg/dL (ref 0.61–1.24)
GFR, Estimated: >60 mL/min (ref >60)
Glucose, Bld: 97 mg/dL (ref 70–99)
Potassium: 3.2 mmol/L — ABNORMAL LOW (ref 3.5–5.1)
Sodium: 135 mmol/L (ref 135–145)
Total Bilirubin: 1.1 mg/dL (ref 0.3–1.2)
Total Protein: 4.8 g/dL — ABNORMAL LOW (ref 6.5–8.1)

## 2021-01-03 IMAGING — RF DG NASO G TUBE PLC W/FL W/RAD
1 series · 1 of 1 positions shown · IV contrast (agent unspecified)
Comparison: Head CT [DATE].

CLINICAL DATA: Dobbhoff tube placement.

EXAM:
NASO G TUBE PLACEMENT WITH FL AND WITH RAD
CONTRAST:  None.
FLUOROSCOPY TIME:  Fluoroscopy Time:  0 minutes 6 seconds
Radiation Exposure Index (if provided by the fluoroscopic device):
1.0 mGy
Number of Acquired Spot Images: 1

[Series 1: fluoro_iodine_singleshot_bw · 0.17mm/px · 1 of 1 slices shown]
[im 1/1]
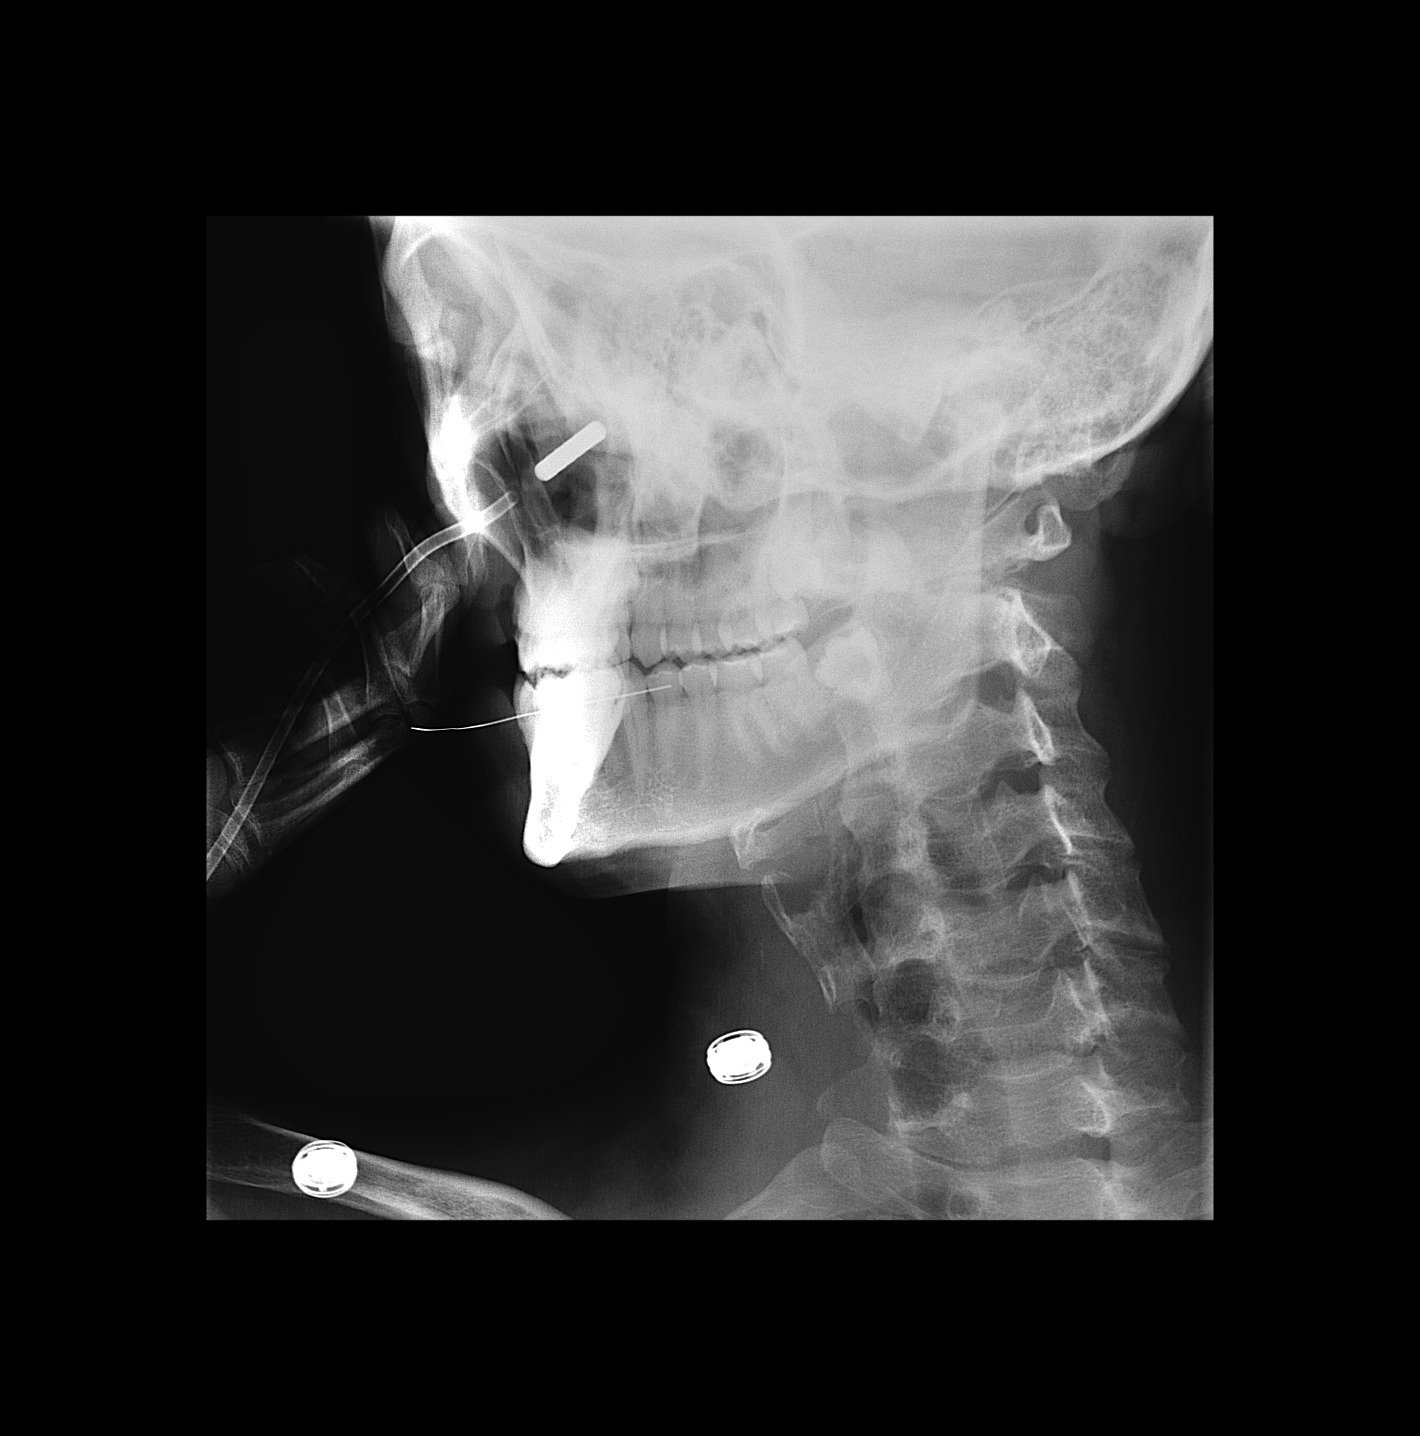

[1 of 1 positions shown; findings below may reference images not displayed]

FINDINGS: Attempts were made at placing a Dobbhoff tube through both nasal
passages. Dobbhoff tube could not be advanced past the nasal
passages. The possibility of a process such as septal deviation or
other obstructing abnormality within the nasal passages should be
considered. ENT consultation suggested for further evaluation.
IMPRESSION: Dobbhoff tube would not advance through either nasal passage. The
possibly of a process such as septal deviation or other obstructing
abnormality within the nasal passages should be considered. ENT
consultation suggested for further evaluation.

## 2021-01-03 MED ORDER — LORAZEPAM 0.5 MG PO TABS
0.5000 mg | ORAL_TABLET | Freq: Four times a day (QID) | ORAL | Status: DC | PRN
  Administered 2021-01-03 – 2021-01-04 (×2): 0.5 mg via ORAL
  Filled 2021-01-03 (×2): qty 1

## 2021-01-03 MED ORDER — KETOROLAC TROMETHAMINE 15 MG/ML IJ SOLN
15.0000 mg | Freq: Four times a day (QID) | INTRAMUSCULAR | Status: AC | PRN
  Administered 2021-01-03 – 2021-01-08 (×20): 15 mg via INTRAVENOUS
  Filled 2021-01-03 (×20): qty 1

## 2021-01-03 MED ORDER — METHOCARBAMOL 500 MG PO TABS
500.0000 mg | ORAL_TABLET | Freq: Four times a day (QID) | ORAL | Status: DC | PRN
  Administered 2021-01-04: 500 mg via ORAL
  Filled 2021-01-03 (×2): qty 1

## 2021-01-03 MED ORDER — POTASSIUM CHLORIDE 10 MEQ/100ML IV SOLN
10.0000 meq | INTRAVENOUS | Status: AC
  Administered 2021-01-03 (×4): 10 meq via INTRAVENOUS
  Filled 2021-01-03 (×4): qty 100

## 2021-01-03 NOTE — Plan of Care (Signed)
  Problem: Education: °Goal: Knowledge of General Education information will improve °Description: Including pain rating scale, medication(s)/side effects and non-pharmacologic comfort measures °Outcome: Not Met (add Reason) °  °Problem: Health Behavior/Discharge Planning: °Goal: Ability to manage health-related needs will improve °Outcome: Not Met (add Reason) °  °Problem: Clinical Measurements: °Goal: Ability to maintain clinical measurements within normal limits will improve °Outcome: Not Met (add Reason) °Goal: Will remain free from infection °Outcome: Not Met (add Reason) °Goal: Diagnostic test results will improve °Outcome: Not Met (add Reason) °Goal: Respiratory complications will improve °Outcome: Not Met (add Reason) °Goal: Cardiovascular complication will be avoided °Outcome: Not Met (add Reason) °  °Problem: Activity: °Goal: Risk for activity intolerance will decrease °Outcome: Not Met (add Reason) °  °Problem: Nutrition: °Goal: Adequate nutrition will be maintained °Outcome: Not Met (add Reason) °  °Problem: Coping: °Goal: Level of anxiety will decrease °Outcome: Not Met (add Reason) °  °Problem: Elimination: °Goal: Will not experience complications related to bowel motility °Outcome: Not Met (add Reason) °Goal: Will not experience complications related to urinary retention °Outcome: Not Met (add Reason) °  °Problem: Pain Managment: °Goal: General experience of comfort will improve °Outcome: Not Met (add Reason) °  °Problem: Safety: °Goal: Ability to remain free from injury will improve °Outcome: Not Met (add Reason) °  °Problem: Skin Integrity: °Goal: Risk for impaired skin integrity will decrease °Outcome: Not Met (add Reason) °  °

## 2021-01-03 NOTE — Progress Notes (Signed)
PROGRESS NOTE    Shane Jenkins Sr.  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 He describes the pain as bandlike across his abdomen and radiating to the back associated with nausea but no vomiting. He hashad multiple episodes of pancreatitis related to alcohol use in the past but states that he hasabstain from further alcohol use since his last hospitalization about a year ago. He was seen in the emergency room about 5 days ago for abdominal pain and was diagnosed with mild pancreatitis and discharged home with a prescription for oxycodone. He was told to be on a liquid diet and to return to the emergency room if his symptoms worsened.Patient states that he took  2-3  tablets of the oxycodone 10 mg andthe next day developed a generalized erythematous macular rash which is nonpruritic. He stopped taking the oxycodone but the rash has persisted.  2/20- lipase elevated 2106, covid negative. Reports rash better but still persistent. 2/22-low grade fever this am. Tmax 100.6 2/23: Improved pain control.  Continues to spike low-grade temperatures.  100.5 noted on 2/22 at 1956.  Patient still well.  Endorses hunger.  Remains NPO.  Diagnostic paracentesis cannot be performed due to lack of safe window.   Assessment & Plan:   Principal Problem:   Acute on chronic pancreatitis (HCC) Active Problems:   Nicotine dependence  Acute on chronic pancreatitis History of chronic alcoholic pancreatitis Not actively drinking, last drink 1 year ago Lipase initially 2106, now 80 Repeat CAT scan raised the possibility of pancreatic duct  disruption versus rupture of peripancreatic necrotic area Diagnostic paracentesis ordered however could not be performed due to lack of safe window Started on meropenem per GI recommendations -Patient has been seen by gastroenterology who wish to keep the patient n.p.o. considering uptrending white count and persistent fevers.  Recommending postpyloric NG tube placement and initiation of nasogastric tube feeds Plan: Continue meropenem Aggressive IV fluids Continue n.p.o. per GI recommendations As needed pain control Daily labs Follow any further GI recommendations IR consulted for image guided postpyloric Dobbhoff placement  Nicotine dependence Counseling offered during this admission Pt declined nicotine patch  History of dural venous sinus thrombosis Chronic anticoagulation Continue Xarelto  Skin rash Improving Occurred after narcotic administration Plan: As needed Benadryl Avoid narcotics  DVT prophylaxis: Xarelto Code Status: Full Family Communication: Shane Jenkins at bedside 2/23 Disposition Plan: Status is: Inpatient  Remains inpatient appropriate because:Inpatient level of care appropriate due to severity of illness   Dispo: The patient is from: Home              Anticipated d/c is to: Home              Patient currently is not medically stable to d/c.   Difficult to place patient No   Patient with uptrending white count in the setting of acute on chronic pancreatitis.  Remains NPO.  Plan for NG tube placement and initiation of enteric tube feeds today.  Disposition plan pending.          Level of care: Med-Surg  Consultants:   GI  Procedures:   None  Antimicrobials:   Meropenem   Subjective: Seen and examined.  States he feels okay.  Endorses hunger.  Objective: Vitals:   01/02/21 2329 01/03/21 0426 01/03/21 0835 01/03/21 1206  BP: 111/70 132/76 120/72 136/75  Pulse: 92 90 87 89  Resp: Temp: 98.2 F (36.8 C) 98.3 F (36.8 C)  98.5 F (36.9 C) 98.6 F (37 C)  TempSrc:      SpO2: 98% 96% 98% 98%  Weight:      Height:        Intake/Output Summary (Last 24 hours) at 01/03/2021 1238 Last data filed at 01/03/2021 0244 Gross per 24 hour  Intake 1078.73 ml  Output --  Net 1078.73 ml   Filed Weights   12/29/20 0732  Weight: 63.5 kg    Examination:  General exam: Appears calm and comfortable  Respiratory system: Clear to auscultation. Respiratory effort normal. Cardiovascular system: S1 & S2 heard, RRR. No JVD, murmurs, rubs, gallops or clicks. No pedal edema. Gastrointestinal system: Nondistended.  Tender to palpation left upper quadrant.  Normal bowel sounds Central nervous system: Alert and oriented. No focal neurological deficits. Extremities: Symmetric 5 x 5 power. Skin: No rashes, lesions or ulcers Psychiatry: Judgement and insight appear normal. Mood & affect appropriate.     Data Reviewed: I have personally reviewed following labs and imaging studies  CBC: Recent Labs  Lab 12/30/20 1113 12/31/20 0332 01/01/21 0548 01/02/21 0357 01/03/21 0635  WBC 15.3* 18.9* 18.0* 16.3* 17.9*  NEUTROABS  --   --   --   --  13.1*  HGB 12.7* 13.1 12.0* 10.9* 10.9*  HCT 37.2* 37.9* 34.3* 32.1* 32.0*  MCV 91.9 92.4 91.5 91.7 91.7  PLT 150 150 146* 168 181   Basic Metabolic Panel: Recent Labs  Lab 12/29/20 0746 12/30/20 0415 01/02/21 0357 01/03/21 0635  NA 138 136 136 135  K 4.4 3.8 3.2* 3.2*  CL 104 103 101 100  CO2 21* GLUCOSE 113* 121* 113* 97  BUN 15 10 5* 6  CREATININE 0.84 0.66 0.61 0.66  CALCIUM 9.0 8.3* 7.7* 7.8*   GFR: Estimated Creatinine Clearance: 116.9 mL/min (by C-G formula based on SCr of 0.66 mg/dL). Liver Function Tests: Recent Labs  Lab 12/29/20 0746 01/03/21 0635  AST 21 20  ALT 18 14  ALKPHOS 56 55  BILITOT 0.5 1.1  PROT 6.2* 4.8*  ALBUMIN 3.5 2.1*   Recent Labs  Lab 12/29/20 0746 12/31/20 0332 01/01/21 0548 01/02/21 0357  LIPASE 2,106* 622* 185*  79*   No results for input(s): AMMONIA in the last 168 hours. Coagulation Profile: No results for input(s): INR, PROTIME in the last 168 hours. Cardiac Enzymes: No results for input(s): CKTOTAL, CKMB, CKMBINDEX, TROPONINI in the last 168 hours. BNP (last 3 results) No results for input(s): PROBNP in the last 8760 hours. HbA1C: No results for input(s): HGBA1C in the last 72 hours. CBG: No results for input(s): GLUCAP in the last 168 hours. Lipid Profile: No results for input(s): CHOL, HDL, LDLCALC, TRIG, CHOLHDL, LDLDIRECT in the last 72 hours. Thyroid Function Tests: No results for input(s): TSH, T4TOTAL, FREET4, T3FREE, THYROIDAB in the last 72 hours. Anemia Panel: No results for input(s): VITAMINB12, FOLATE, FERRITIN, TIBC, IRON, RETICCTPCT in the last 72 hours. Sepsis Labs: No results for input(s): PROCALCITON, LATICACIDVEN in the last 168 hours.  Recent Results (from the past 240 hour(s))  SARS CORONAVIRUS 2 (TAT 6-24 HRS) Nasopharyngeal Nasopharyngeal Swab     Status: None   Collection Time: 12/29/20 11:24 AM  Specimen: Nasopharyngeal Swab  Result Value Ref Range Status   SARS Coronavirus 2 NEGATIVE NEGATIVE Final    Comment: (NOTE) SARS-CoV-2 target nucleic acids are NOT DETECTED.  The SARS-CoV-2 RNA is generally detectable in upper and lower respiratory specimens during the acute phase of infection. Negative results do not preclude SARS-CoV-2 infection, do not rule out co-infections with other pathogens, and should not be used as the sole basis for treatment or other patient management decisions. Negative results must be combined with clinical observations, patient history, and epidemiological information. The expected result is Negative.  Fact Sheet for Patients: HairSlick.no  Fact Sheet for Healthcare Providers: quierodirigir.com  This test is not yet approved or cleared by the [place] FDA and  has  been authorized for detection and/or diagnosis of SARS-CoV-2 by FDA under an Emergency Use Authorization (EUA). This EUA will remain  in effect (meaning this test can be used) for the duration of the COVID-19 declaration under Se ction 564(b)(1) of the Act, 21 U.S.C. section 360bbb-3(b)(1), unless the authorization is terminated or revoked sooner.  Performed at [facility], [address] [place], [place] [address]   Urine Culture     Status: Abnormal   Collection Time: 01/01/21  9:07 AM   Specimen: Urine, Clean Catch  Result Value Ref Range Status   Specimen Description   Final    URINE, CLEAN CATCH Performed at [facility], [address], [place], [place] [address]    Special Requests   Final    NONE Performed at [facility], [address] [place], [place] [address]    Culture (A)  Final    <10,000 COLONIES/mL INSIGNIFICANT GROWTH Performed at [facility], [address] [place], [place] [address]    Report Status 01/02/2021 FINAL  Final  CULTURE, BLOOD (ROUTINE X 2) w Reflex to ID Panel     Status: None (Preliminary result)   Collection Time: 01/01/21  9:47 AM   Specimen: BLOOD  Result Value Ref Range Status   Specimen Description BLOOD LEFT ARM  Final   Special Requests   Final    BOTTLES DRAWN AEROBIC AND ANAEROBIC Blood Culture adequate volume   Culture   Final    NO GROWTH 2 DAYS Performed at [facility], [address], [place], [place] [address]    Report Status PENDING  Incomplete  CULTURE, BLOOD (ROUTINE X 2) w Reflex to ID Panel     Status: None (Preliminary result)   Collection Time: 01/01/21  9:48 AM   Specimen: BLOOD  Result Value Ref Range Status   Specimen Description BLOOD  LEFT HAND  Final   Special Requests   Final    BOTTLES DRAWN AEROBIC AND ANAEROBIC Blood Culture adequate volume   Culture   Final    NO GROWTH 2 DAYS Performed at [facility], [address], [place], [place]  [address]    Report Status PENDING  Incomplete         Radiology Studies: [place] ASCITES (ABDOMEN LIMITED)  Result Date: 01/02/2021 CLINICAL DATA:  35 year old male with a history of pancreatitis EXAM: LIMITED ABDOMEN ULTRASOUND FOR ASCITES TECHNIQUE: Limited ultrasound survey for ascites was performed in all four abdominal quadrants. COMPARISON:  None. FINDINGS: Scant ultrasound within the abdomen, layer dependently in the pelvis. No adequate window for paracentesis attempt. IMPRESSION: Scant ascites. Electronically Signed   By: Gilmer Mor D.O.   On: 01/02/2021 09:33   CT PANCREAS ABD W/WO  Result Date: 01/01/2021 CLINICAL DATA:  History of pancreatitis, necrotizing pancreatitis with fever and white blood cell count. EXAM: CT ABDOMEN WITHOUT AND WITH CONTRAST TECHNIQUE: Multidetector CT imaging of the abdomen was performed following the standard protocol before and following the bolus administration of intravenous contrast. CONTRAST:  OMNIPAQUE IOHEXOL 300 MG/ML  SOLN COMPARISON:  December 25, 2020 FINDINGS: Lower chest: Trace RIGHT and small LEFT effusion with basilar airspace disease, this is developed since the study of December 25, 2020. Hepatobiliary: No focal, suspicious hepatic lesion. The portal vein remains patent. New perihepatic ascites since previous imaging. Gallbladder is nondistended. Biliary tree is nondilated. Pancreas: Pancreas with cystic areas in the head of the pancreas largest measuring 2.8 cm, (image 49, series 4) unchanged since very recent imaging. The the more posteriorly oriented cystic area in the head of the pancreas adjacent to the main pancreatic duct 2.4 cm greatest axial dimension previously 2.1 cm. Mild main duct distension is similar to previous imaging. Parenchymal enhancement is maintained largely throughout the pancreas with the exception of the cystic areas. In addition to ascites described above there is extensive stranding in fluid throughout the  retroperitoneum in the anterior and posterior pararenal space. On the LEFT fluid measures approximately 2.3 cm greatest thickness in the posterior pararenal space and at the sacral promontory 2.5 cm greatest thickness. Slightly less fluid on the RIGHT. Fluid crosses the midline in the vascular space of the retroperitoneum. Ovoid collection posterior to the enlarging cystic area in the pancreatic head and adjacent to the duodenum measuring 2.8 x 1.7 cm, this area was not present on the previous study. This fluid containing area is contiguous with changes in the retroperitoneum described above. Spleen: Normal splenic enhancement and normal size spleen. Splenic vein remains patent. Adrenals/Urinary Tract: Adrenal glands are normal. Symmetric renal enhancement. No hydronephrosis though with mild distension of the ureters and enhancement of the distal ureters in the setting of extensive retroperitoneal inflammatory changes. Stomach/Bowel: Duodenal inflammation. Generalized bowel edema in the setting of new inflammation in the abdomen and new ascites. Vascular/Lymphatic: Smooth contour of the IVC and aorta. The cystic area in the high intra-aortocaval groove is immediately posterior to the enlarging cystic area which appears to communicate with the pancreas in the pancreatic head posteriorly as described. Splenic vein, SMV portal vein remain patent. Other: No free air. Signs of ascites new from previous imaging. Extensive body wall edema also new from prior studies. Musculoskeletal: No acute musculoskeletal process. Spinal degenerative changes. IMPRESSION: 1. Intense inflammation and fluid tracking from the posterior pancreatic head into the vascular space and pararenal spaces of the bilateral retroperitoneum, and associated with abdominal ascites. 2. Constellation of findings suspicious for rupture of necrotic collection or enlarging pseudocyst in the head of the pancreas in communication with the main pancreatic duct.  Given rapid development of the above process since the study of February 15th, perhaps related to acute on chronic pancreatitis and is likely associated with ductal disruption and communication with the retroperitoneum. 3. Ureteral inflammation and mild dilation associated with intense inflammation in the retroperitoneum and pancreatic fluid tracking into these areas as described. 4. Secondary bowel edema in the setting of anasarca and developing ascites. 5. Aortic atherosclerosis. These results will be called to the ordering clinician or representative by the Radiologist Assistant, and communication documented in the PACS or [employer]. Aortic Atherosclerosis (ICD10-I70.0). Electronically Signed   By: Donzetta Kohut M.D.   On: 01/01/2021 15:53        Scheduled Meds: .  ascorbic acid  500 mg Oral BID  . DULoxetine  20 mg Oral Daily  . metoprolol tartrate  50 mg Oral BID  . pantoprazole (PROTONIX) IV  40 mg Intravenous Q24H  . rivaroxaban  20 mg Oral Q supper  . vitamin A  10,000 Units Oral Daily  . [START ON 01/04/2021] Vitamin D (Ergocalciferol)  50,000 Units Oral Q7 days  . zinc sulfate  220 mg Oral Daily   Continuous Infusions: . lactated ringers 200 mL/hr at 01/03/21 [id number]  . meropenem (MERREM) IV 1 g (01/03/21 1036)     LOS: 5 days    Time spent: 25 minutes    Tresa MooreSudheer B Emoni Whitworth, MD [facility] Pager [phone]  If 7PM-7AM, please contact night-coverage 01/03/2021, 12:38 PM

## 2021-01-03 NOTE — Consult Note (Signed)
..   Stpehen, Petitjean [id number] February 20, 1986 Tresa Moore, MD  Reason for Consult: Difficulty with Dobbhoff tube placement  HPI: 35 y.o. male admitted for acute pancreatitis that is worsening on lab parameters.  Today an IR Dobbhoff tube was attempted to be placed but was unsuccessful due to nasal obstruction per report.  ENT consulted for evaluation of nasal obstruction.  Patient denies breaking his nose or history of significant nasal obstructions.  He reports prior attempted placement was very painful.  Allergies:  Allergies  Allergen Reactions  . Oxycodone Rash    Rash/itching which required benadryl to resolve  . Sudafed [Pseudoephedrine]     History per pt of this being linked with his seizures    ROS: Review of systems normal other than 12 systems except per HPI.  PMH:  Past Medical History:  Diagnosis Date  . Alcohol use disorder, severe, dependence (HCC)     FH:  Family History  Problem Relation Age of Onset  . Alcohol abuse Mother     SH:  Social History   Socioeconomic History  . Marital status: Divorced    Spouse name: Not on file  . Number of children: Not on file  . Years of education: Not on file  . Highest education level: Not on file  Occupational History  . Not on file  Tobacco Use  . Smoking status: Current Every Day Smoker  . Smokeless tobacco: Never Used  Substance and Sexual Activity  . Alcohol use: Not Currently    Comment: none since June 2021  . Drug use: Not Currently  . Sexual activity: Not on file  Other Topics Concern  . Not on file  Social History Narrative  . Not on file   Social Determinants of Health   Financial Resource Strain: Not on file  Food Insecurity: Not on file  Transportation Needs: Not on file  Physical Activity: Not on file  Stress: Not on file  Social Connections: Not on file  Intimate Partner Violence: Not on file    PSH: History reviewed. No pertinent surgical history.  Physical  Exam:  GEN-  NAD,  laying in bed supine NEURO-CN 2-12 grossly intact and symmetric. EARS- external ears clear NOSE- clear bilaterally, moderate left side deviation inferiorly OC/OP- clear NECK- no LAD, no masses or lesions RESP- unlabored CARD-  RRR  Procedure:  Flexible nasal endoscopy:  After verbal consent was obtained the patient's bilateral nasal cavities were anesthetized with phenylephrine and lidocaine spray.  A flexible fiberoptic endoscope was inserted into the patient's right nasal cavity.  This demonstrated a relatively straight septum and mild inferior turbinate hypertrophy.  Posteriorly in the nasopharynx was some bruising and dried blood from prior dobhoff placement.  Airway widely patent with no apparent obstruction.  On the patient's left side there was an inferior based septal deviation with much smaller nasal airway.  Visualization of the patient's larynx was WNL.  A/P: Left sided septal deviation but clear right sided nasal passage.  Trauma appeared to be in nasopharynx on posterior wall.  Patient should be candidate for placement down right side based on my exam.  Could consider decongesting with Afrin or potentially placement of nasal trumpet dilators with numbing medication to help with process.   Bud Face 01/03/2021 7:38 PM

## 2021-01-03 NOTE — Progress Notes (Signed)
[facility] Inpatient Progress Note  Subjective: Patient seen for f/u worsening pancreatitis. Patient still in NAD, appears overall well. Still with some 'tightness' in the abdomen but no severe pain. Has had progressive increase in WBC, worsening fluid collections in the abdomen, spiking and resolving fevers.  Objective: Vital signs in last 24 hours: Temp:  [98 F (36.7 C)-99.7 F (37.6 C)] 98 F (36.7 C) (02/24 1514) Pulse Rate:  [87-109] 95 (02/24 1514) Resp:  [17-18] 18 (02/24 1514) BP: (111-136)/(70-79) 135/79 (02/24 1514) SpO2:  [96 %-99 %] 99 % (02/24 1514) Weight:  [73 kg] 73 kg (02/24 1613) Blood pressure 135/79, pulse 95, temperature 98 F (36.7 C), resp. rate 18, height 6\' 1"  (1.854 m), weight 73 kg, SpO2 99 %.    Intake/Output from previous day: 02/23 0701 - 02/24 0700 In: 1078.7 [I.V.:878.8; IV Piggyback:199.9] Out: -   Intake/Output this shift: No intake/output data recorded.   General appearance:  Alert, NAD Resp:  CTA Cardio:  RRR GI: distended, diffusely tender without rebound, guarding or rigidity. BS hypoactive. Extremities: No edema.   Lab Results: Results for orders placed or performed during the hospital encounter of 12/29/20 (from the past 24 hour(s))  CBC with Differential/Platelet     Status: Abnormal   Collection Time: 01/03/21  6:35 AM  Result Value Ref Range   WBC 17.9 (H) 4.0 - 10.5 K/uL   RBC 3.49 (L) 4.22 - 5.81 MIL/uL   Hemoglobin 10.9 (L) 13.0 - 17.0 g/dL   HCT 01/05/21 (L) 83.1 - 51.7 %   MCV 91.7 80.0 - 100.0 fL   MCH 31.2 26.0 - 34.0 pg   MCHC 34.1 30.0 - 36.0 g/dL   RDW 61.6 07.3 - 71.0 %   Platelets 181 150 - 400 K/uL   nRBC 0.0 0.0 - 0.2 %   Neutrophils Relative % 72 %   Neutro Abs 13.1 (H) 1.7 - 7.7 K/uL   Lymphocytes Relative 9 %   Lymphs Abs 1.5 0.7 - 4.0 K/uL   Monocytes Relative 7 %   Monocytes Absolute 1.2 (H) 0.1 - 1.0 K/uL   Eosinophils Relative 11 %   Eosinophils Absolute 1.9 (H) 0.0 - 0.5 K/uL    Basophils Relative 0 %   Basophils Absolute 0.1 0.0 - 0.1 K/uL   Immature Granulocytes 1 %   Abs Immature Granulocytes 0.19 (H) 0.00 - 0.07 K/uL  Comprehensive metabolic panel     Status: Abnormal   Collection Time: 01/03/21  6:35 AM  Result Value Ref Range   Sodium 135 135 - 145 mmol/L   Potassium 3.2 (L) 3.5 - 5.1 mmol/L   Chloride 100 98 - 111 mmol/L   CO2 24 22 - 32 mmol/L   Glucose, Bld 97 70 - 99 mg/dL   BUN 6 6 - 20 mg/dL   Creatinine, Ser 01/05/21 0.61 - 1.24 mg/dL   Calcium 7.8 (L) 8.9 - 10.3 mg/dL   Total Protein 4.8 (L) 6.5 - 8.1 g/dL   Albumin 2.1 (L) 3.5 - 5.0 g/dL   AST 20 15 - 41 U/L   ALT 14 0 - 44 U/L   Alkaline Phosphatase 55 38 - 126 U/L   Total Bilirubin 1.1 0.3 - 1.2 mg/dL   GFR, Estimated 9.48 >54 mL/min   Anion gap 11 5 - 15     Recent Labs    01/01/21 0548 01/02/21 0357 01/03/21 0635  WBC 18.0* 16.3* 17.9*  HGB 12.0* 10.9* 10.9*  HCT 34.3* 32.1* 32.0*  PLT 146* 168 181   BMET Recent Labs    01/02/21 0357 01/03/21 0635  NA 136 135  K 3.2* 3.2*  CL 101 100  CO2 25 24  GLUCOSE 113* 97  BUN 5* 6  CREATININE 0.61 0.66  CALCIUM 7.7* 7.8*   LFT Recent Labs    01/03/21 0635  PROT 4.8*  ALBUMIN 2.1*  AST 20  ALT 14  ALKPHOS 55  BILITOT 1.1   PT/INR No results for input(s): LABPROT, INR in the last 72 hours. Hepatitis Panel No results for input(s): HEPBSAG, HCVAB, HEPAIGM, HEPBIGM in the last 72 hours. C-Diff No results for input(s): CDIFFTOX in the last 72 hours. No results for input(s): CDIFFPCR in the last 72 hours.   Studies/Results: DG Naso G Tube Plc W/Fl W/Rad  Result Date: 01/03/2021 CLINICAL DATA:  Dobbhoff tube placement. EXAM: NASO G TUBE PLACEMENT WITH FL AND WITH RAD CONTRAST:  None. FLUOROSCOPY TIME:  Fluoroscopy Time:  0 minutes 6 seconds Radiation Exposure Index (if provided by the fluoroscopic device): 1.0 mGy Number of Acquired Spot Images: 1 COMPARISON:  Head CT 05/23/2020. FINDINGS: Attempts were made at placing a  Dobbhoff tube through both nasal passages. Dobbhoff tube could not be advanced past the nasal passages. The possibility of a process such as septal deviation or other obstructing abnormality within the nasal passages should be considered. ENT consultation suggested for further evaluation. IMPRESSION: Dobbhoff tube would not advance through either nasal passage. The possibly of a process such as septal deviation or other obstructing abnormality within the nasal passages should be considered. ENT consultation suggested for further evaluation. Electronically Signed   By: Maisie Fus  Register   On: 01/03/2021 14:27   [place] ASCITES (ABDOMEN LIMITED)  Result Date: 01/02/2021 CLINICAL DATA:  35 year old male with a history of pancreatitis EXAM: LIMITED ABDOMEN ULTRASOUND FOR ASCITES TECHNIQUE: Limited ultrasound survey for ascites was performed in all four abdominal quadrants. COMPARISON:  None. FINDINGS: Scant ultrasound within the abdomen, layer dependently in the pelvis. No adequate window for paracentesis attempt. IMPRESSION: Scant ascites. Electronically Signed   By: Gilmer Mor D.O.   On: 01/02/2021 09:33    Scheduled Inpatient Medications:   . ascorbic acid  500 mg Oral BID  . DULoxetine  20 mg Oral Daily  . metoprolol tartrate  50 mg Oral BID  . pantoprazole (PROTONIX) IV  40 mg Intravenous Q24H  . rivaroxaban  20 mg Oral Q supper  . vitamin A  10,000 Units Oral Daily  . [START ON 01/04/2021] Vitamin D (Ergocalciferol)  50,000 Units Oral Q7 days  . zinc sulfate  220 mg Oral Daily    Continuous Inpatient Infusions:   . lactated ringers 200 mL/hr at 01/03/21 4259  . meropenem (MERREM) IV 1 g (01/03/21 1036)  . potassium chloride 10 mEq (01/03/21 1601)    PRN Inpatient Medications:  acetaminophen, diphenhydrAMINE, ketorolac, LORazepam, ondansetron **OR** ondansetron (ZOFRAN) IV   Assessment:  1. Acute pancreatitis - Clinically stable but worsening lab parameters and vitals.  2. Feeding  problem - IR unable to pass DHT, ENT consult to follow.   Plan:  1. Await ENT evaluation.  2. Nasojejunal feedings ASAP.  3.Following  Charleen Madera K. Norma Fredrickson, M.D. 01/03/2021, 4:43 PM

## 2021-01-03 NOTE — Progress Notes (Signed)
Initial Nutrition Assessment  DOCUMENTATION CODES:   Non-severe (moderate) malnutrition in context of chronic illness  INTERVENTION:  Recommend placing post-pyloric nasoenteric tube that terminates ideally at the ligament of Treitz. Once tube placed and confirmed recommend: -Initiate Vital 1.5 Cal at 20 mL/hr and advance by 20 mL/hr every 8 hours to goal rate of 60 mL/hr per tube -Provide PROSource TF 45 mL once daily per tube -Goal regimen provides 2200 kcal, 108 grams of protein, 1094 mL H2O daily -Provide minimum free water flush of 30 mL Q4hrs to maintain tube patency while patient is on IV fluids  Consider rechecking vitamin/mineral labs as patient has now been on supplementation approximately 7 months: iron/anemia profile including folate, copper, vitamin D, vitamin A, vitamin C, zinc.  NUTRITION DIAGNOSIS:   Moderate Malnutrition related to chronic illness (hx EtOH use, chronic pancreatitis) as evidenced by mild fat depletion,mild muscle depletion,moderate muscle depletion.  GOAL:   Patient will meet greater than or equal to 90% of their needs  MONITOR:   Diet advancement,Labs,Weight trends,TF tolerance,I & O's  REASON FOR ASSESSMENT:   NPO/Clear Liquid Diet    ASSESSMENT:   35 year old male with PMHx of EtOH use, hx chronic pancreatitis who is admitted with acute pancreatitis, presumed pancreatic ascites.   Patient was NPO on admission 2/19, placed on clear liquids on 2/22 but then later changed to NPO. Plan per GI is for patient to remain NPO at this time. Plan was for IR to place post-pyloric tube but attempt today was unsuccessful (they could not advance past the nares). ENT was consulted per IR recommendation.  Met with patient at bedside. He has not been drinking alcohol since his last admission. Patient know to this RD from his admission in 05/2020 where he was on TPN and found to have multiple nutrient deficiencies. Patient reports his appetite has been good at  baseline and he eats 3 meals per day. He reports he eats many different kinds of foods including pizza, chicken, hot dogs, sandwiches, burgers. He typically drinks water or cranberry juice. He reports he has been taking all of his vitamin supplements as prescribed during last admission. As it has now been 7 months patient would likely benefit from rechecking levels. Once post-pyloric tube able to be placed plan is to initiate tube feeds.  Patient reports he cannot recall his UBW. He was 59.8 kg on 05/31/2020. RN zeroed bed scale and weighed patient today and he was 73 kg (160.94 lbs). Noticeable improvement in nutrition-focused physical exam today, which aligns with weight gain.  Medications reviewed and include: vitamin C 500 mg BID, Protonix, vitamin A 10000 units daily, vitamin D 50000 units every 7 days, zinc sulfate 220 mg daily, LR at 200 mL/hr, meropenem, potassium chloride 10 mEq IV x 4 today.  Labs reviewed: Potassium 3.2.  On 05/21/2020: Iron 17, TIBC 116, Ferritin 971, Folate 5.6. On 05/22/2020: Copper 55, vitamin D 7.39, vitamin A 7.7, vitamin C 0.1, and zinc was 49, which is the low end of normal.  Discussed with RN at bedside. Discussed with Attending MD, GI, and RN in secure chat.  NUTRITION - FOCUSED PHYSICAL EXAM:  Flowsheet Row Most Recent Value  Orbital Region Mild depletion  Upper Arm Region Moderate depletion  Thoracic and Lumbar Region Mild depletion  Buccal Region Mild depletion  Temple Region Mild depletion  Clavicle Bone Region Moderate depletion  Clavicle and Acromion Bone Region Moderate depletion  Scapular Bone Region Moderate depletion  Dorsal Hand Mild depletion  Patellar Region Moderate depletion  Anterior Thigh Region Moderate depletion  Posterior Calf Region Moderate depletion  Edema (RD Assessment) None  Hair Reviewed  Eyes Reviewed  Mouth Reviewed  Skin Reviewed  Nails Reviewed     Diet Order:   Diet Order            Diet NPO time specified  Diet  effective now                EDUCATION NEEDS:   No education needs have been identified at this time  Skin:  Skin Assessment: Reviewed RN Assessment  Last BM:  01/02/2021 - medium type 7  Height:   Ht Readings from Last 1 Encounters:  12/29/20 '6\' 1"'  (1.854 m)   Weight:   Wt Readings from Last 1 Encounters:  01/03/21 73 kg   BMI:  Body mass index is 21.23 kg/m.  Estimated Nutritional Needs:   Kcal:  2100-2300  Protein:  105-115 grams  Fluid:  2.1-2.3 L/day  Jacklynn Barnacle, MS, RD, LDN Pager number available on Amion

## 2021-01-04 ENCOUNTER — Inpatient Hospital Stay: Payer: Medicaid Other

## 2021-01-04 DIAGNOSIS — K861 Other chronic pancreatitis: Secondary | ICD-10-CM | POA: Diagnosis not present

## 2021-01-04 DIAGNOSIS — K859 Acute pancreatitis without necrosis or infection, unspecified: Secondary | ICD-10-CM | POA: Diagnosis not present

## 2021-01-04 LAB — CBC WITH DIFFERENTIAL/PLATELET
Abs Immature Granulocytes: 0.29 10*3/uL — ABNORMAL HIGH (ref 0.00–0.07)
Basophils Absolute: 0.1 10*3/uL (ref 0.0–0.1)
Basophils Relative: 0 %
Eosinophils Absolute: 1.3 10*3/uL — ABNORMAL HIGH (ref 0.0–0.5)
Eosinophils Relative: 8 %
HCT: 30.4 % — ABNORMAL LOW (ref 39.0–52.0)
Hemoglobin: 10.3 g/dL — ABNORMAL LOW (ref 13.0–17.0)
Immature Granulocytes: 2 %
Lymphocytes Relative: 7 %
Lymphs Abs: 1.2 10*3/uL (ref 0.7–4.0)
MCH: 31.2 pg (ref 26.0–34.0)
MCHC: 33.9 g/dL (ref 30.0–36.0)
MCV: 92.1 fL (ref 80.0–100.0)
Monocytes Absolute: 1.2 10*3/uL — ABNORMAL HIGH (ref 0.1–1.0)
Monocytes Relative: 7 %
Neutro Abs: 13.7 10*3/uL — ABNORMAL HIGH (ref 1.7–7.7)
Neutrophils Relative %: 76 %
Platelets: 205 10*3/uL (ref 150–400)
RBC: 3.3 MIL/uL — ABNORMAL LOW (ref 4.22–5.81)
RDW: 12.7 % (ref 11.5–15.5)
WBC: 17.8 10*3/uL — ABNORMAL HIGH (ref 4.0–10.5)
nRBC: 0 % (ref 0.0–0.2)

## 2021-01-04 LAB — COMPREHENSIVE METABOLIC PANEL
ALT: 14 U/L (ref 0–44)
AST: 23 U/L (ref 15–41)
Albumin: 2.1 g/dL — ABNORMAL LOW (ref 3.5–5.0)
Alkaline Phosphatase: 61 U/L (ref 38–126)
Anion gap: 11 (ref 5–15)
BUN: 7 mg/dL (ref 6–20)
CO2: 21 mmol/L — ABNORMAL LOW (ref 22–32)
Calcium: 7.7 mg/dL — ABNORMAL LOW (ref 8.9–10.3)
Chloride: 103 mmol/L (ref 98–111)
Creatinine, Ser: 0.67 mg/dL (ref 0.61–1.24)
GFR, Estimated: >60 mL/min (ref >60)
Glucose, Bld: 95 mg/dL (ref 70–99)
Potassium: 3.4 mmol/L — ABNORMAL LOW (ref 3.5–5.1)
Sodium: 135 mmol/L (ref 135–145)
Total Bilirubin: 1.3 mg/dL — ABNORMAL HIGH (ref 0.3–1.2)
Total Protein: 4.9 g/dL — ABNORMAL LOW (ref 6.5–8.1)

## 2021-01-04 LAB — IRON AND TIBC
Iron: 21 ug/dL — ABNORMAL LOW (ref 45–182)
Saturation Ratios: 20 % (ref 17.9–39.5)
TIBC: 104 ug/dL — ABNORMAL LOW (ref 250–450)
UIBC: 83 ug/dL

## 2021-01-04 LAB — FOLATE: Folate: 15.2 ng/mL (ref >5.9)

## 2021-01-04 LAB — VITAMIN D 25 HYDROXY (VIT D DEFICIENCY, FRACTURES): Vit D, 25-Hydroxy: 32.69 ng/mL (ref 30–100)

## 2021-01-04 LAB — FERRITIN: Ferritin: 732 ng/mL — ABNORMAL HIGH (ref 24–336)

## 2021-01-04 IMAGING — RF DG NASO G TUBE PLC W/FL W/RAD
1 series · 1 of 1 positions shown · non-contrast
Comparison: None.

CLINICAL DATA: Dobbhoff tube placement

EXAM:
NASO G TUBE PLACEMENT WITH FL AND WITH RAD
FLUOROSCOPY TIME:  Fluoroscopy Time:  0.2 minutes
Radiation Exposure Index (if provided by the fluoroscopic device):
0.6 mGy
Number of Acquired Spot Images: 0

[Series 2: cp_standard · 0.26mm/px · 1 of 1 slices shown]
[im 1/1]
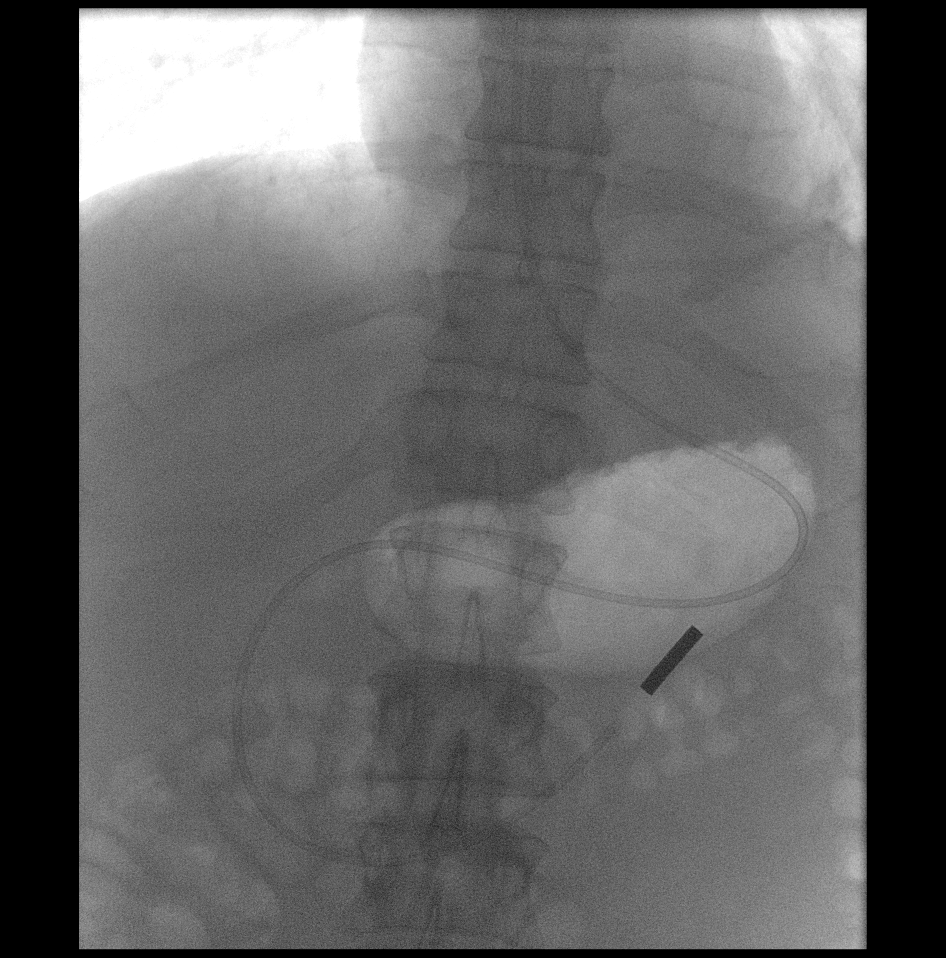

[1 of 1 positions shown; findings below may reference images not displayed]

FINDINGS: 8 French Dobbhoff tube was inserted through the right nare without
difficulty. The tip of the Dobbhoff tube was advanced into the
proximal jejunum and confirmed with a fluoroscopic image. Tube was
secured to the nose. Patient tolerated the procedure well. No
complications.
IMPRESSION: Successful placement of 8 French Dobbhoff tube with the tip in the
jejunum.

## 2021-01-04 MED ORDER — LORAZEPAM 0.5 MG PO TABS
0.5000 mg | ORAL_TABLET | Freq: Four times a day (QID) | ORAL | Status: DC | PRN
  Administered 2021-01-07: 0.5 mg
  Filled 2021-01-04: qty 1

## 2021-01-04 MED ORDER — OXYMETAZOLINE HCL 0.05 % NA SOLN
1.0000 | Freq: Two times a day (BID) | NASAL | Status: DC | PRN
  Filled 2021-01-04: qty 15

## 2021-01-04 MED ORDER — ZINC SULFATE 220 (50 ZN) MG PO CAPS
220.0000 mg | ORAL_CAPSULE | Freq: Every day | ORAL | Status: DC
  Administered 2021-01-05 – 2021-01-10 (×6): 220 mg
  Filled 2021-01-04 (×6): qty 1

## 2021-01-04 MED ORDER — ASCORBIC ACID 500 MG PO TABS
500.0000 mg | ORAL_TABLET | Freq: Two times a day (BID) | ORAL | Status: DC
  Administered 2021-01-04 – 2021-01-10 (×12): 500 mg
  Filled 2021-01-04 (×12): qty 1

## 2021-01-04 MED ORDER — ONDANSETRON HCL 4 MG/2ML IJ SOLN: 4.0000 mg | Freq: Four times a day (QID) | INTRAMUSCULAR | Status: DC | PRN

## 2021-01-04 MED ORDER — METOPROLOL TARTRATE 50 MG PO TABS
50.0000 mg | ORAL_TABLET | Freq: Two times a day (BID) | ORAL | Status: DC
  Administered 2021-01-04 – 2021-01-10 (×12): 50 mg
  Filled 2021-01-04 (×12): qty 1

## 2021-01-04 MED ORDER — VITAL 1.5 CAL PO LIQD
1000.0000 mL | ORAL | Status: DC
  Administered 2021-01-04 – 2021-01-08 (×3): 1000 mL

## 2021-01-04 MED ORDER — VITAL HIGH PROTEIN PO LIQD: 1000.0000 mL | ORAL | Status: DC

## 2021-01-04 MED ORDER — ONDANSETRON HCL 4 MG PO TABS: 4.0000 mg | ORAL_TABLET | Freq: Four times a day (QID) | ORAL | Status: DC | PRN

## 2021-01-04 MED ORDER — VITAMIN A 3 MG (10000 UNIT) PO CAPS
10000.0000 [IU] | ORAL_CAPSULE | Freq: Every day | ORAL | Status: DC
  Administered 2021-01-05 – 2021-01-10 (×4): 10000 [IU]
  Filled 2021-01-04 (×6): qty 1

## 2021-01-04 MED ORDER — METHOCARBAMOL 500 MG PO TABS
500.0000 mg | ORAL_TABLET | Freq: Four times a day (QID) | ORAL | Status: DC | PRN
  Administered 2021-01-05 – 2021-01-09 (×9): 500 mg
  Filled 2021-01-04 (×9): qty 1

## 2021-01-04 MED ORDER — FREE WATER
30.0000 mL | Status: DC
  Administered 2021-01-04 – 2021-01-09 (×30): 30 mL

## 2021-01-04 MED ORDER — ACETAMINOPHEN 325 MG PO TABS
650.0000 mg | ORAL_TABLET | ORAL | Status: DC | PRN
  Administered 2021-01-05 – 2021-01-09 (×15): 650 mg
  Filled 2021-01-04 (×15): qty 2

## 2021-01-04 MED ORDER — RIVAROXABAN 20 MG PO TABS
20.0000 mg | ORAL_TABLET | Freq: Every day | ORAL | Status: DC
  Administered 2021-01-05 – 2021-01-09 (×5): 20 mg
  Filled 2021-01-04 (×7): qty 1

## 2021-01-04 MED ORDER — VITAMIN D (ERGOCALCIFEROL) 1.25 MG (50000 UNIT) PO CAPS: 50000.0000 [IU] | ORAL_CAPSULE | ORAL | Status: DC

## 2021-01-04 MED ORDER — PROSOURCE TF PO LIQD
45.0000 mL | Freq: Two times a day (BID) | ORAL | Status: DC
  Administered 2021-01-04 – 2021-01-07 (×7): 45 mL
  Filled 2021-01-04: qty 45

## 2021-01-04 NOTE — Progress Notes (Signed)
GI Inpatient Follow-up Note  Subjective:  Patient seen in follow-up for worsening acute on chronic pancreatitis. No acute events overnight. Patient underwent successful placement of postpyloric feeding tube. RD plans to initiate tube feedings today. No new complaints. Patient denies abdominal pain, nausea, vomiting, or fevers. WBC still elevated at 17.8 this morning.   Scheduled Inpatient Medications:  . ascorbic acid  500 mg Per Tube BID  . DULoxetine  20 mg Oral Daily  . feeding supplement (PROSource TF)  45 mL Per Tube BID  . free water  30 mL Per Tube Q4H  . metoprolol tartrate  50 mg Per Tube BID  . pantoprazole (PROTONIX) IV  40 mg Intravenous Q24H  . rivaroxaban  20 mg Oral Q supper  . [START ON 01/05/2021] vitamin A  10,000 Units Per Tube Daily  . [START ON 01/11/2021] Vitamin D (Ergocalciferol)  50,000 Units Per Tube Q7 days  . [START ON 01/05/2021] zinc sulfate  220 mg Per Tube Daily    Continuous Inpatient Infusions:   . feeding supplement (VITAL 1.5 CAL)    . lactated ringers 125 mL/hr at 01/04/21 1228  . meropenem (MERREM) IV 1 g (01/04/21 0913)    PRN Inpatient Medications:  acetaminophen, diphenhydrAMINE, ketorolac, LORazepam, methocarbamol, ondansetron **OR** ondansetron (ZOFRAN) IV, oxymetazoline  Review of Systems: Constitutional: Weight is stable.  Eyes: No changes in vision. ENT: No oral lesions, sore throat.  GI: see HPI.  Heme/Lymph: No easy bruising.  CV: No chest pain.  GU: No hematuria.  Integumentary: No rashes.  Neuro: No headaches.  Psych: No depression/anxiety.  Endocrine: No heat/cold intolerance.  Allergic/Immunologic: No urticaria.  Resp: No cough, SOB.  Musculoskeletal: No joint swelling.    Physical Examination: BP (!) 145/76 (BP Location: Right Arm)   Pulse 95   Temp 98.4 F (36.9 C)   Resp 17   Ht 6\' 1"  (1.854 m)   Wt 73 kg Comment: bed scale  SpO2 97%   BMI 21.23 kg/m  Gen: NAD, alert and oriented x 4 HEENT: PEERLA,  EOMI, Neck: supple, no JVD or thyromegaly Chest: CTA bilaterally, no wheezes, crackles, or other adventitious sounds CV: RRR, no m/g/c/r Abd: soft, hypoactive BS in all four quadrants; nontender to palpation in all four quadrants, no epigastric TTP, no HSM, guarding, ridigity, or rebound tenderness Ext: no edema, well perfused with 2+ pulses, Skin: no rash or lesions noted Lymph: no LAD  Data: Lab Results  Component Value Date   WBC 17.8 (H) 01/04/2021   HGB 10.3 (L) 01/04/2021   HCT 30.4 (L) 01/04/2021   MCV 92.1 01/04/2021   PLT 205 01/04/2021   Recent Labs  Lab 01/02/21 0357 01/03/21 0635 01/04/21 0427  HGB 10.9* 10.9* 10.3*   Lab Results  Component Value Date   NA 135 01/04/2021   K 3.4 (L) 01/04/2021   CL 103 01/04/2021   CO2 21 (L) 01/04/2021   BUN 7 01/04/2021   CREATININE 0.67 01/04/2021   Lab Results  Component Value Date   ALT 14 01/04/2021   AST 23 01/04/2021   ALKPHOS 61 01/04/2021   BILITOT 1.3 (H) 01/04/2021   No results for input(s): APTT, INR, PTT in the last 168 hours.  Assessment:   1. Acute on chronic pancreatitis - Clinically stable but worsening lab parameters. Fevers resolved. WBC still elevated at 17.8K.   2. Feeding problem - successful placement of postpyloric feeding tube today by IR. RD contacted to initiate feedings.   Plan:   1. Begin  nasojejunal feedings today  2. Repeat serial abdominal examinations  3. Continue Meropenem IV and IV fluid hydration  4. Strict NPO for now  5. Dr. Allegra Lai of [facility] will be following over the weekend   Discussed plan of care with Dr. Norma Fredrickson who is also in agreement with above plan of care.   Please call with questions or concerns.   Jacob Moores, PA-C [facility] [phone] (Cell)

## 2021-01-04 NOTE — Progress Notes (Signed)
PROGRESS NOTE    Shane Frieson Sr.  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 He describes the pain as bandlike across his abdomen and radiating to the back associated with nausea but no vomiting. He hashad multiple episodes of pancreatitis related to alcohol use in the past but states that he hasabstain from further alcohol use since his last hospitalization about a year ago. He was seen in the emergency room about 5 days ago for abdominal pain and was diagnosed with mild pancreatitis and discharged home with a prescription for oxycodone. He was told to be on a liquid diet and to return to the emergency room if his symptoms worsened.Patient states that he took  2-3  tablets of the oxycodone 10 mg andthe next day developed a generalized erythematous macular rash which is nonpruritic. He stopped taking the oxycodone but the rash has persisted.  2/20- lipase elevated 2106, covid negative. Reports rash better but still persistent. 2/22-low grade fever this am. Tmax 100.6 2/23: Improved pain control.  Continues to spike low-grade temperatures.  100.5 noted on 2/22 at 1956.  Patient still well.  Endorses hunger.  Remains NPO.  Diagnostic paracentesis cannot be performed due to lack of safe window. 2/25: After initial unsuccessful IR guided Dobbhoff tube placement I contacted ENT who scoped the patient's nose at bedside.  Found the right nare to be patent without deviation.  Interventional radiology contacted again and patient underwent successful placement of postpyloric feeding tube.  RD contacted for tube feeding  initiation.   Assessment & Plan:   Principal Problem:   Acute on chronic pancreatitis [facility]) Active Problems:   Nicotine dependence   Malnutrition of moderate degree  Acute on chronic pancreatitis History of chronic alcoholic pancreatitis Not actively drinking, last drink 1 year ago Lipase initially 2106, now 80 Repeat CAT scan raised the possibility of pancreatic duct disruption versus rupture of peripancreatic necrotic area Diagnostic paracentesis ordered however could not be performed due to lack of safe window Started on meropenem per GI recommendations 2/25: Postpyloric DHT in place.  RD contacted for tube feeding initiation Plan: Dobbhoff tube in place, initiate tube feeds Continue meropenem Aggressive IV fluids Continue n.p.o. per GI recommendations As needed pain control Daily labs Follow any further GI recommendations  Nicotine dependence Counseling offered during this admission Pt declined nicotine patch  History of dural venous sinus thrombosis Chronic anticoagulation Continue Xarelto  Skin rash Improving Occurred after narcotic administration Plan: As needed Benadryl Avoid narcotics  DVT prophylaxis: Xarelto Code Status: Full Family Communication: Wife at bedside 2/23 Disposition Plan: Status is: Inpatient  Remains inpatient appropriate because:Inpatient level of care appropriate due to severity of illness   Dispo: The patient is from: Home              Anticipated d/c is to: Home              Patient currently is not medically stable to d/c.   Difficult to place patient No   Acute on chronic pancreatitis.  Complex presentation.  Persistent leukocytosis.  Dobbhoff tube in place and will initiate postpyloric feedings today.  Level of care: Med-Surg  Consultants:   GI  Procedures:   None  Antimicrobials:   Meropenem   Subjective: Seen and examined.  States he feels okay.  Endorses  hunger.  Objective: Vitals:   01/04/21 0150 01/04/21 0415 01/04/21 0738 01/04/21 1119  BP: (!) 143/78 (!) 147/81 124/73 138/79  Pulse: 97 (!) 104 100 90  Resp: 18 17 20 18   Temp: 99 F (37.2 C) 98.4 F (36.9 C) 98.6 F (37 C) 98 F (36.7 C)  TempSrc:  Oral    SpO2: 95% 94% 92% 95%  Weight:      Height:        Intake/Output Summary (Last 24 hours) at 01/04/2021 1206 Last data filed at 01/04/2021 0500 Gross per 24 hour  Intake --  Output 650 ml  Net -650 ml   Filed Weights   12/29/20 0732 01/03/21 1613  Weight: 63.5 kg 73 kg    Examination:  General exam: Appears calm and comfortable  Respiratory system: Clear to auscultation. Respiratory effort normal. Cardiovascular system: S1 & S2 heard, RRR. No JVD, murmurs, rubs, gallops or clicks. No pedal edema. Gastrointestinal system: Nondistended.  Tender to palpation left upper quadrant.  Normal bowel sounds Central nervous system: Alert and oriented. No focal neurological deficits. Extremities: Symmetric 5 x 5 power. Skin: No rashes, lesions or ulcers Psychiatry: Judgement and insight appear normal. Mood & affect appropriate.     Data Reviewed: I have personally reviewed following labs and imaging studies  CBC: Recent Labs  Lab 12/31/20 0332 01/01/21 0548 01/02/21 0357 01/03/21 0635 01/04/21 0427  WBC 18.9* 18.0* 16.3* 17.9* 17.8*  NEUTROABS  --   --   --  13.1* 13.7*  HGB 13.1 12.0* 10.9* 10.9* 10.3*  HCT 37.9* 34.3* 32.1* 32.0* 30.4*  MCV 92.4 91.5 91.7 91.7 92.1  PLT 150 146* 168 181 205   Basic Metabolic Panel: Recent Labs  Lab 12/29/20 0746 12/30/20 0415 01/02/21 0357 01/03/21 0635 01/04/21 0427  NA 138 136 136 135 135  K 4.4 3.8 3.2* 3.2* 3.4*  CL 104 103 101 100 103  CO2 21* 24 25 24  21*  GLUCOSE 113* 121* 113* 97 95  BUN 15 10 5* 6 7  CREATININE 0.84 0.66 0.61 0.66 0.67  CALCIUM 9.0 8.3* 7.7* 7.8* 7.7*   GFR: Estimated Creatinine Clearance: 134.3 mL/min (by C-G formula based on SCr of 0.67  mg/dL). Liver Function Tests: Recent Labs  Lab 12/29/20 0746 01/03/21 0635 01/04/21 0427  AST 21 20 23   ALT 18 14 14   ALKPHOS 56 55 61  BILITOT 0.5 1.1 1.3*  PROT 6.2* 4.8* 4.9*  ALBUMIN 3.5 2.1* 2.1*   Recent Labs  Lab 12/29/20 0746 12/31/20 0332 01/01/21 0548 01/02/21 0357  LIPASE 2,106* 622* 185* 79*   No results for input(s): AMMONIA in the last 168 hours. Coagulation Profile: No results for input(s): INR, PROTIME in the last 168 hours. Cardiac Enzymes: No results for input(s): CKTOTAL, CKMB, CKMBINDEX, TROPONINI in the last 168 hours. BNP (last 3 results) No results for input(s): PROBNP in the last 8760 hours. HbA1C: No results for input(s): HGBA1C in the last 72 hours. CBG: No results for input(s): GLUCAP in the last 168 hours. Lipid Profile: No results for input(s): CHOL, HDL, LDLCALC, TRIG, CHOLHDL, LDLDIRECT in the last 72 hours. Thyroid Function Tests: No results for input(s): TSH, T4TOTAL, FREET4, T3FREE, THYROIDAB in the last 72 hours. Anemia Panel: Recent Labs    01/04/21 0427 01/04/21 0927  FOLATE  --  15.2  FERRITIN 732*  --   TIBC  --  104*  IRON  --  21*   Sepsis Labs: No results for input(s): PROCALCITON, LATICACIDVEN in the last 168 hours.  Recent Results (from the past 240 hour(s))  SARS CORONAVIRUS 2 (TAT 6-24 HRS) Nasopharyngeal Nasopharyngeal Swab     Status: None   Collection Time: 12/29/20 11:24 AM   Specimen: Nasopharyngeal Swab  Result Value Ref Range Status   SARS Coronavirus 2 NEGATIVE NEGATIVE Final    Comment: (NOTE) SARS-CoV-2 target nucleic acids are NOT DETECTED.  The SARS-CoV-2 RNA is generally detectable in upper and lower respiratory specimens during the acute phase of infection. Negative results do not preclude SARS-CoV-2 infection, do not rule out co-infections with other pathogens, and should not be used as the sole basis for treatment or other patient management decisions. Negative results must be combined with  clinical observations, patient history, and epidemiological information. The expected result is Negative.  Fact Sheet for Patients: HairSlick.nohttps://www.fda.gov/media/138098/download  Fact Sheet for Healthcare Providers: quierodirigir.comhttps://www.fda.gov/media/138095/download  This test is not yet approved or cleared by the [place] FDA and  has been authorized for detection and/or diagnosis of SARS-CoV-2 by FDA under an Emergency Use Authorization (EUA). This EUA will remain  in effect (meaning this test can be used) for the duration of the COVID-19 declaration under Se ction 564(b)(1) of the Act, 21 U.S.C. section 360bbb-3(b)(1), unless the authorization is terminated or revoked sooner.  Performed at [facility], [address] [place], [place] [address]   Urine Culture     Status: Abnormal   Collection Time: 01/01/21  9:07 AM   Specimen: Urine, Clean Catch  Result Value Ref Range Status   Specimen Description   Final    URINE, CLEAN CATCH Performed at [facility], [address] [place], [place] [address]    Special Requests   Final    NONE Performed at [facility], [address] [place], [place] [address]    Culture (A)  Final    <10,000 COLONIES/mL INSIGNIFICANT GROWTH Performed at [facility], [address] [place], [place] [address]    Report Status 01/02/2021 FINAL  Final  CULTURE, BLOOD (ROUTINE X 2) w Reflex to ID Panel     Status: None (Preliminary result)   Collection Time: 01/01/21  9:47 AM   Specimen: BLOOD  Result Value Ref Range Status   Specimen Description BLOOD LEFT ARM  Final   Special Requests   Final    BOTTLES DRAWN AEROBIC AND ANAEROBIC Blood Culture adequate volume   Culture   Final    NO GROWTH 3 DAYS Performed at [facility], [address] [place], [place] [address]    Report Status PENDING  Incomplete  CULTURE, BLOOD (ROUTINE X 2) w Reflex to ID Panel     Status: None (Preliminary result)   Collection  Time: 01/01/21  9:48 AM   Specimen: BLOOD  Result Value Ref Range Status   Specimen Description BLOOD  LEFT HAND  Final   Special Requests   Final    BOTTLES DRAWN AEROBIC AND ANAEROBIC Blood Culture adequate volume   Culture   Final    NO GROWTH 3 DAYS Performed at [facility], [address] [place], [place] [address]    Report Status PENDING  Incomplete         Radiology Studies: DG Basil Dessaso G Tube Plc W/Fl W/Rad  Result Date: 01/04/2021 CLINICAL DATA:  Dobbhoff tube placement EXAM: NASO G TUBE PLACEMENT WITH FL AND WITH RAD FLUOROSCOPY TIME:  Fluoroscopy Time:  0.2 minutes Radiation Exposure Index (if provided by the fluoroscopic device): 0.6 mGy Number of Acquired Spot Images: 0 COMPARISON:  None. FINDINGS: 8 [place] Dobbhoff tube was inserted through the right nare without difficulty. The tip of the Dobbhoff tube was advanced into the proximal jejunum and confirmed with a fluoroscopic image. Tube was secured to the nose. Patient tolerated the procedure well. No complications. IMPRESSION: Successful placement of 8 French Dobbhoff tube with the tip in the jejunum. Electronically Signed   By: Elige Ko   On: 01/04/2021 10:54   DG Naso G Tube Plc W/Fl W/Rad  Result Date: 01/03/2021 CLINICAL DATA:  Dobbhoff tube placement. EXAM: NASO G TUBE PLACEMENT WITH FL AND WITH RAD CONTRAST:  None. FLUOROSCOPY TIME:  Fluoroscopy Time:  0 minutes 6 seconds Radiation Exposure Index (if provided by the fluoroscopic device): 1.0 mGy Number of Acquired Spot Images: 1 COMPARISON:  Head CT 05/23/2020. FINDINGS: Attempts were made at placing a Dobbhoff tube through both nasal passages. Dobbhoff tube could not be advanced past the nasal passages. The possibility of a process such as septal deviation or other obstructing abnormality within the nasal passages should be considered. ENT consultation suggested for further evaluation. IMPRESSION: Dobbhoff tube would not advance through either nasal passage.  The possibly of a process such as septal deviation or other obstructing abnormality within the nasal passages should be considered. ENT consultation suggested for further evaluation. Electronically Signed   By: Maisie Fus  Register   On: 01/03/2021 14:27        Scheduled Meds: . ascorbic acid  500 mg Oral BID  . DULoxetine  20 mg Oral Daily  . feeding supplement (PROSource TF)  45 mL Per Tube BID  . free water  30 mL Per Tube Q4H  . metoprolol tartrate  50 mg Oral BID  . pantoprazole (PROTONIX) IV  40 mg Intravenous Q24H  . rivaroxaban  20 mg Oral Q supper  . vitamin A  10,000 Units Oral Daily  . Vitamin D (Ergocalciferol)  50,000 Units Oral Q7 days  . zinc sulfate  220 mg Oral Daily   Continuous Infusions: . feeding supplement (VITAL 1.5 CAL)    . lactated ringers 200 mL/hr at 01/04/21 0404  . meropenem (MERREM) IV 1 g (01/04/21 0913)     LOS: 6 days    Time spent: 15 minutes    Tresa Moore, MD [facility] Pager [phone]  If 7PM-7AM, please contact night-coverage 01/04/2021, 12:06 PM

## 2021-01-04 NOTE — Plan of Care (Signed)

## 2021-01-04 NOTE — Progress Notes (Signed)
Tube feed started at this time. Pt at 37ml/hr feed with 30 mL flushq4, per orders. Feed to be adjusted in 8 hrs- 0100 01/05/21.

## 2021-01-05 DIAGNOSIS — K861 Other chronic pancreatitis: Secondary | ICD-10-CM

## 2021-01-05 DIAGNOSIS — K859 Acute pancreatitis without necrosis or infection, unspecified: Secondary | ICD-10-CM | POA: Diagnosis not present

## 2021-01-05 LAB — CBC WITH DIFFERENTIAL/PLATELET
Abs Immature Granulocytes: 0.2 10*3/uL — ABNORMAL HIGH (ref 0.00–0.07)
Basophils Absolute: 0.1 10*3/uL (ref 0.0–0.1)
Basophils Relative: 0 %
Eosinophils Absolute: 1 10*3/uL — ABNORMAL HIGH (ref 0.0–0.5)
Eosinophils Relative: 6 %
HCT: 28.5 % — ABNORMAL LOW (ref 39.0–52.0)
Hemoglobin: 9.7 g/dL — ABNORMAL LOW (ref 13.0–17.0)
Immature Granulocytes: 1 %
Lymphocytes Relative: 7 %
Lymphs Abs: 1.2 10*3/uL (ref 0.7–4.0)
MCH: 31.3 pg (ref 26.0–34.0)
MCHC: 34 g/dL (ref 30.0–36.0)
MCV: 91.9 fL (ref 80.0–100.0)
Monocytes Absolute: 1.3 10*3/uL — ABNORMAL HIGH (ref 0.1–1.0)
Monocytes Relative: 8 %
Neutro Abs: 13.3 10*3/uL — ABNORMAL HIGH (ref 1.7–7.7)
Neutrophils Relative %: 78 %
Platelets: 220 10*3/uL (ref 150–400)
RBC: 3.1 MIL/uL — ABNORMAL LOW (ref 4.22–5.81)
RDW: 12.7 % (ref 11.5–15.5)
WBC: 17.1 10*3/uL — ABNORMAL HIGH (ref 4.0–10.5)
nRBC: 0 % (ref 0.0–0.2)

## 2021-01-05 LAB — GLUCOSE, CAPILLARY
Glucose-Capillary: 119 mg/dL — ABNORMAL HIGH (ref 70–99)
Glucose-Capillary: 113 mg/dL — ABNORMAL HIGH (ref 70–99)
Glucose-Capillary: 115 mg/dL — ABNORMAL HIGH (ref 70–99)

## 2021-01-05 LAB — COMPREHENSIVE METABOLIC PANEL
ALT: 19 U/L (ref 0–44)
AST: 37 U/L (ref 15–41)
Albumin: 2.1 g/dL — ABNORMAL LOW (ref 3.5–5.0)
Alkaline Phosphatase: 68 U/L (ref 38–126)
Anion gap: 9 (ref 5–15)
BUN: 8 mg/dL (ref 6–20)
CO2: 24 mmol/L (ref 22–32)
Calcium: 7.7 mg/dL — ABNORMAL LOW (ref 8.9–10.3)
Chloride: 103 mmol/L (ref 98–111)
Creatinine, Ser: 0.63 mg/dL (ref 0.61–1.24)
GFR, Estimated: >60 mL/min (ref >60)
Glucose, Bld: 117 mg/dL — ABNORMAL HIGH (ref 70–99)
Potassium: 3.1 mmol/L — ABNORMAL LOW (ref 3.5–5.1)
Sodium: 136 mmol/L (ref 135–145)
Total Bilirubin: 0.8 mg/dL (ref 0.3–1.2)
Total Protein: 4.9 g/dL — ABNORMAL LOW (ref 6.5–8.1)

## 2021-01-05 LAB — PHOSPHORUS: Phosphorus: 3.2 mg/dL (ref 2.5–4.6)

## 2021-01-05 LAB — MAGNESIUM: Magnesium: 1.6 mg/dL — ABNORMAL LOW (ref 1.7–2.4)

## 2021-01-05 LAB — COPPER, SERUM: Copper: 96 ug/dL (ref 69–132)

## 2021-01-05 LAB — ZINC: Zinc: 62 ug/dL (ref 44–115)

## 2021-01-05 MED ORDER — MAGNESIUM SULFATE 2 GM/50ML IV SOLN
2.0000 g | Freq: Once | INTRAVENOUS | Status: AC
  Administered 2021-01-05: 2 g via INTRAVENOUS
  Filled 2021-01-05: qty 50

## 2021-01-05 MED ORDER — DIPHENHYDRAMINE HCL 25 MG PO CAPS: 25.0000 mg | ORAL_CAPSULE | Freq: Four times a day (QID) | ORAL | Status: DC | PRN

## 2021-01-05 MED ORDER — POTASSIUM CHLORIDE 20 MEQ PO PACK
40.0000 meq | PACK | Freq: Once | ORAL | Status: AC
  Administered 2021-01-05: 40 meq
  Filled 2021-01-05: qty 2

## 2021-01-05 NOTE — Consult Note (Signed)
PHARMACY CONSULT NOTE  Pharmacy Consult for Electrolyte Monitoring and Replacement   Recent Labs: Potassium (mmol/L)  Date Value  01/05/2021 3.1 (L)   Magnesium (mg/dL)  Date Value  16/83/7290 1.6 (L)   Calcium (mg/dL)  Date Value  21/09/5519 7.7 (L)   Albumin (g/dL)  Date Value  80/22/3361 2.1 (L)   Phosphorus (mg/dL)  Date Value  22/44/9753 3.2   Sodium (mmol/L)  Date Value  01/05/2021 136    Assessment: Patient is a 35 y/o M with medical history including chronic alcoholic pancreatitis, history of VTE on anticoagulation, nicotine use who was admitted 2/19 with acute on chronic pancreatitis. Pharmacy consulted to assist with electrolyte monitoring and replacement as indicated.  Nutrition: Tube feeds + free water flushes 30 mL q4h MIVF: LR at 100 mL/hr  Goal of Therapy:  Electrolytes within normal limits  Plan:  2/26 AM labs: K 3.1, Mg 1.6 --KCl 40 mEq per tube x 1 --IV magnesium sulfate 2 g x 1 --Follow-up electrolytes with AM labs  Tressie Ellis 01/05/2021 1:09 PM

## 2021-01-05 NOTE — Progress Notes (Signed)
Shane Repress, MD [address]  [address]  [place], [place] [address]  Main: [phone]  Fax: [phone] Pager: [phone]   Subjective: Patient is started on postpyloric tube feeds. He is tolerating tube feeds well, at 60 mL/h. Reports having bowel movements. He denies any abdominal pain, nausea or vomiting. Lying comfortably in bed   Objective: Vital signs in last 24 hours: Vitals:   01/04/21 1937 01/04/21 2311 01/05/21 0650 01/05/21 0726  BP: (!) 145/83 (!) 154/92 136/82 130/67  Pulse: 97 (!) 106 (!) 109 100  Resp: 19 17 18 17   Temp: 98.4 F (36.9 C) 99.7 F (37.6 C) 99.9 F (37.7 C) 99.7 F (37.6 C)  TempSrc: Oral Oral Oral Oral  SpO2: 97% 99% 93% 94%  Weight:      Height:       Weight change:   Intake/Output Summary (Last 24 hours) at 01/05/2021 1238 Last data filed at 01/05/2021 1155 Gross per 24 hour  Intake 4870.82 ml  Output 500 ml  Net 4370.82 ml     Exam: Heart:: Regular rate and rhythm, S1S2 present or without murmur or extra heart sounds Lungs: normal and clear to auscultation Abdomen: soft, nontender, normal bowel sounds   Lab Results: CBC Latest Ref Rng & Units 01/05/2021 01/04/2021 01/03/2021  WBC 4.0 - 10.5 K/uL 17.1(H) 17.8(H) 17.9(H)  Hemoglobin 13.0 - 17.0 g/dL 01/05/2021) 10.3(L) 10.9(L)  Hematocrit 39.0 - 52.0 % 28.5(L) 30.4(L) 32.0(L)  Platelets 150 - 400 K/uL 220 205 181   CMP Latest Ref Rng & Units 01/05/2021 01/04/2021 01/03/2021  Glucose 70 - 99 mg/dL 01/05/2021) 95 97  BUN 6 - 20 mg/dL 8 7 6   Creatinine 0.61 - 1.24 mg/dL 536(R 4.43  Sodium 135 - 145 mmol/L 136 135 135  Potassium 3.5 - 5.1 mmol/L 3.1(L) 3.4(L) 3.2(L)  Chloride 98 - 111 mmol/L 103 103 100  CO2 22 - 32 mmol/L 24 21(L) 24  Calcium 8.9 - 10.3 mg/dL 7.7(L) 7.7(L) 7.8(L)  Total Protein 6.5 - 8.1 g/dL 4.9(L) 4.9(L) 4.8(L)  Total Bilirubin 0.3 - 1.2 mg/dL 0.8 1.54) 1.1  Alkaline Phos 38 - 126 U/L 68 61 55  AST 15 - 41 U/L 37 23 20  ALT 0 - 44 U/L 19 14 14      Micro Results: Recent Results (from the past 240 hour(s))  SARS CORONAVIRUS 2 (TAT 6-24 HRS) Nasopharyngeal Nasopharyngeal Swab     Status: None   Collection Time: 12/29/20 11:24 AM   Specimen: Nasopharyngeal Swab  Result Value Ref Range Status   SARS Coronavirus 2 NEGATIVE NEGATIVE Final    Comment: (NOTE) SARS-CoV-2 target nucleic acids are NOT DETECTED.  The SARS-CoV-2 RNA is generally detectable in upper and lower respiratory specimens during the acute phase of infection. Negative results do not preclude SARS-CoV-2 infection, do not rule out co-infections with other pathogens, and should not be used as the sole basis for treatment or other patient management decisions. Negative results must be combined with clinical observations, patient history, and epidemiological information. The expected result is Negative.  Fact Sheet for Patients: 6.7(Y  Fact Sheet for Healthcare Providers:  This test is not yet approved or cleared by the 12/31/20 FDA and  has been authorized for detection and/or diagnosis of SARS-CoV-2 by FDA under an Emergency Use Authorization (EUA). This EUA will remain  in effect (meaning this test can be used) for the duration of the COVID-19 declaration under Se ction 564(b)(1) of the Act, 21  U.S.C. section 360bbb-3(b)(1), unless the authorization is terminated or revoked sooner.  Performed at [facility], [address] [place], [place] [address]   Urine Culture     Status: Abnormal   Collection Time: 01/01/21  9:07 AM   Specimen: Urine, Clean Catch  Result Value Ref Range Status   Specimen Description   Final    URINE, CLEAN CATCH Performed at [facility], [address] [place], [place] [address]    Special Requests   Final    NONE Performed at [facility], [address] [place], [place] [address]    Culture (A)  Final     <10,000 COLONIES/mL INSIGNIFICANT GROWTH Performed at [facility], [address] [place], [place] [address]    Report Status 01/02/2021 FINAL  Final  CULTURE, BLOOD (ROUTINE X 2) w Reflex to ID Panel     Status: None (Preliminary result)   Collection Time: 01/01/21  9:47 AM   Specimen: BLOOD  Result Value Ref Range Status   Specimen Description BLOOD LEFT ARM  Final   Special Requests   Final    BOTTLES DRAWN AEROBIC AND ANAEROBIC Blood Culture adequate volume   Culture   Final    NO GROWTH 4 DAYS Performed at [facility], [address] [place], [place] [address]    Report Status PENDING  Incomplete  CULTURE, BLOOD (ROUTINE X 2) w Reflex to ID Panel     Status: None (Preliminary result)   Collection Time: 01/01/21  9:48 AM   Specimen: BLOOD  Result Value Ref Range Status   Specimen Description BLOOD  LEFT HAND  Final   Special Requests   Final    BOTTLES DRAWN AEROBIC AND ANAEROBIC Blood Culture adequate volume   Culture   Final    NO GROWTH 4 DAYS Performed at [facility], [address] [place], [place] [address]    Report Status PENDING  Incomplete   Studies/Results: DG Basil Dessaso G Tube Plc W/Fl W/Rad  Result Date: 01/04/2021 CLINICAL DATA:  Dobbhoff tube placement EXAM: NASO G TUBE PLACEMENT WITH FL AND WITH RAD FLUOROSCOPY TIME:  Fluoroscopy Time:  0.2 minutes Radiation Exposure Index (if provided by the fluoroscopic device): 0.6 mGy Number of Acquired Spot Images: 0 COMPARISON:  None. FINDINGS: 8 [place] Dobbhoff tube was inserted through the right nare without difficulty. The tip of the Dobbhoff tube was advanced into the proximal jejunum and confirmed with a fluoroscopic image. Tube was secured to the nose. Patient tolerated the procedure well. No complications. IMPRESSION: Successful placement of 8 French Dobbhoff tube with the tip in the jejunum. Electronically Signed   By: Elige KoHetal  Patel   On: 01/04/2021 10:54   DG Naso G Tube Plc W/Fl  W/Rad  Result Date: 01/03/2021 CLINICAL DATA:  Dobbhoff tube placement. EXAM: NASO G TUBE PLACEMENT WITH FL AND WITH RAD CONTRAST:  None. FLUOROSCOPY TIME:  Fluoroscopy Time:  0 minutes 6 seconds Radiation Exposure Index (if provided by the fluoroscopic device): 1.0 mGy Number of Acquired Spot Images: 1 COMPARISON:  Head CT 05/23/2020. FINDINGS: Attempts were made at placing a Dobbhoff tube through both nasal passages. Dobbhoff tube could not be advanced past the nasal passages. The possibility of a process such as septal deviation or other obstructing abnormality within the nasal passages should be considered. ENT consultation suggested for further evaluation. IMPRESSION: Dobbhoff tube would not advance through either nasal passage. The possibly of a process such as septal deviation  or other obstructing abnormality within the nasal passages should be considered. ENT consultation suggested for further evaluation. Electronically Signed   By: Maisie Fus  Register   On: 01/03/2021 14:27   Medications:  I have reviewed the patient's current medications. Prior to Admission:  Medications Prior to Admission  Medication Sig Dispense Refill Last Dose  . ascorbic acid (VITAMIN C) 500 MG tablet Take 1 tablet (500 mg total) by mouth 2 (two) times daily. 180 tablet 1 12/28/2020 at Unknown time  . diphenhydrAMINE (BENADRYL) 25 mg capsule Take 25 mg by mouth every 6 (six) hours as needed.   12/29/2020 at Unknown time  . DULoxetine (CYMBALTA) 20 MG capsule Take 1 capsule (20 mg total) by mouth daily. 30 capsule 3 12/28/2020 at Unknown time  . ferrous fumarate-b12-vitamic C-folic acid (TRINSICON / FOLTRIN) capsule Take 1 capsule by mouth 2 (two) times daily after a meal. 90 capsule 3 12/28/2020 at Unknown time  . gabapentin (NEURONTIN) 100 MG capsule Take 200 mg by mouth in the morning and at bedtime.   12/28/2020 at Unknown time  . ibuprofen (ADVIL) 200 MG tablet Take 200 mg by mouth every 4 (four) hours as needed for mild  pain or moderate pain.   12/28/2020 at Unknown time  . metoprolol tartrate (LOPRESSOR) 50 MG tablet Take 1 tablet (50 mg total) by mouth 2 (two) times daily. 60 tablet 1 12/28/2020 at Unknown time  . Multiple Vitamin (MULTIVITAMIN WITH MINERALS) TABS tablet Take 1 tablet by mouth daily. 90 tablet 1 12/28/2020 at Unknown time  . multivitamin-lutein (OCUVITE-LUTEIN) CAPS capsule Take 1 capsule by mouth daily. 90 capsule 1 12/28/2020 at Unknown time  . OLANZapine zydis (ZYPREXA) 5 MG disintegrating tablet Take 1 tablet (5 mg total) by mouth at bedtime. 30 tablet 0 12/28/2020 at Unknown time  . [EXPIRED] oxyCODONE-acetaminophen (PERCOCET) 5-325 MG tablet Take 1 tablet by mouth every 8 (eight) hours as needed for up to 5 days for severe pain. 15 tablet 0 12/28/2020 at Unknown time  . pantoprazole (PROTONIX) 40 MG tablet Take 1 tablet (40 mg total) by mouth daily. 90 tablet 1 12/28/2020 at Unknown time  . rivaroxaban (XARELTO) 20 MG TABS tablet Take 1 tablet (20 mg total) by mouth daily with supper. 90 tablet 1 12/28/2020 at Unknown time  . rOPINIRole (REQUIP) 0.5 MG tablet Take 0.5 mg by mouth at bedtime.     . traZODone (DESYREL) 50 MG tablet Take 50 mg by mouth at bedtime.     . vitamin A 3 MG (10000 UNITS) capsule Take 1 capsule (10,000 Units total) by mouth daily. 30 capsule 0 12/28/2020 at Unknown time  . Vitamin D, Ergocalciferol, (DRISDOL) 1.25 MG (50000 UNIT) CAPS capsule Take 1 capsule (50,000 Units total) by mouth every 7 (seven) days. 7 capsule 0 12/28/2020 at Unknown time  . zinc sulfate 220 (50 Zn) MG capsule Take 1 capsule (220 mg total) by mouth daily. 90 capsule 1 12/28/2020 at Unknown time  . feeding supplement, ENSURE ENLIVE, (ENSURE ENLIVE) LIQD Take 237 mLs by mouth 2 (two) times daily between meals. 237 mL 12    Scheduled: . ascorbic acid  500 mg Per Tube BID  . DULoxetine  20 mg Oral Daily  . feeding supplement (PROSource TF)  45 mL Per Tube BID  . free water  30 mL Per Tube Q4H  .  metoprolol tartrate  50 mg Per Tube BID  . pantoprazole (PROTONIX) IV  40 mg Intravenous Q24H  . rivaroxaban  20 mg  Per Tube Q supper  . vitamin A  10,000 Units Per Tube Daily  . [START ON 01/11/2021] Vitamin D (Ergocalciferol)  50,000 Units Per Tube Q7 days  . zinc sulfate  220 mg Per Tube Daily   Continuous: . feeding supplement (VITAL 1.5 CAL) 60 mL/hr at 01/05/21 1145  . lactated ringers 125 mL/hr at 01/05/21 1155  . meropenem (MERREM) IV 1 g (01/05/21 1048)   YTK:[id number], diphenhydrAMINE, ketorolac, LORazepam, methocarbamol, ondansetron **OR** ondansetron (ZOFRAN) IV, oxymetazoline Anti-infectives (From admission, onward)   Start     Dose/Rate Route Frequency Ordered Stop   01/01/21 1800  meropenem (MERREM) 1 g in sodium chloride 0.9 % 100 mL IVPB        1 g 200 mL/hr over 30 Minutes Intravenous Every 8 hours 01/01/21 1659     01/01/21 1730  meropenem (MERREM) 500 mg in sodium chloride 0.9 % 100 mL IVPB  Status:  Discontinued        500 mg 200 mL/hr over 30 Minutes Intravenous Every 8 hours 01/01/21 1638 01/01/21 1658     Scheduled Meds: . ascorbic acid  500 mg Per Tube BID  . DULoxetine  20 mg Oral Daily  . feeding supplement (PROSource TF)  45 mL Per Tube BID  . free water  30 mL Per Tube Q4H  . metoprolol tartrate  50 mg Per Tube BID  . pantoprazole (PROTONIX) IV  40 mg Intravenous Q24H  . rivaroxaban  20 mg Per Tube Q supper  . vitamin A  10,000 Units Per Tube Daily  . [START ON 01/11/2021] Vitamin D (Ergocalciferol)  50,000 Units Per Tube Q7 days  . zinc sulfate  220 mg Per Tube Daily   Continuous Infusions: . feeding supplement (VITAL 1.5 CAL) 60 mL/hr at 01/05/21 1145  . lactated ringers 125 mL/hr at 01/05/21 1155  . meropenem (MERREM) IV 1 g (01/05/21 1048)   PRN Meds:.acetaminophen, diphenhydrAMINE, ketorolac, LORazepam, methocarbamol, ondansetron **OR** ondansetron (ZOFRAN) IV, oxymetazoline   Assessment: Principal Problem:   Acute on chronic pancreatitis  (HCC) Active Problems:   Nicotine dependence   Malnutrition of moderate degree   Plan: Acute on chronic pancreatitis with possibility of pancreatic duct disruption versus rupture of peripancreatic necrotic area Fever has subsided in last 24 hours Leukocytosis is stable Continue meropenem Continue postpyloric jejunal feeds, patient is currently tolerating well Nutritional labs are pending Recommend to start pancreatic enzymes Creon 72K 3 times daily Patient will need follow-up with advanced endoscopist upon discharge   LOS: 7 days   Hodaya Curto 01/05/2021, 12:38 PM

## 2021-01-05 NOTE — Progress Notes (Signed)
PROGRESS NOTE    Shane Jenkins.  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 He describes the pain as bandlike across his abdomen and radiating to the back associated with nausea but no vomiting. He hashad multiple episodes of pancreatitis related to alcohol use in the past but states that he hasabstain from further alcohol use since his last hospitalization about a year ago. He was seen in the emergency room about 5 days ago for abdominal pain and was diagnosed with mild pancreatitis and discharged home with a prescription for oxycodone. He was told to be on a liquid diet and to return to the emergency room if his symptoms worsened.Patient states that he took  2-3  tablets of the oxycodone 10 mg andthe next day developed a generalized erythematous macular rash which is nonpruritic. He stopped taking the oxycodone but the rash has persisted.  2/20- lipase elevated 2106, covid negative. Reports rash better but still persistent. 2/22-low grade fever this am. Tmax 100.6 2/23: Improved pain control.  Continues to spike low-grade temperatures.  100.5 noted on 2/22 at 1956.  Patient still well.  Endorses hunger.  Remains NPO.  Diagnostic paracentesis cannot be performed due to lack of safe window. 2/25: After initial unsuccessful IR guided Dobbhoff tube placement I contacted ENT who scoped the patient's nose at bedside.  Found the right nare to be patent without deviation.  Interventional radiology contacted again and patient underwent successful placement of postpyloric feeding tube.  RD contacted for tube feeding initiation. 2/26:  NG tube in place and tube feeds initiated.  Slowly escalating up to goal.  Patient remains hemodynamically stable.  No fevers reported over past 24 hours.  White count remains elevated slowly trending down    Assessment & Plan:   Principal Problem:   Acute on chronic pancreatitis (HCC) Active Problems:   Nicotine dependence   Malnutrition of moderate degree  Acute on chronic pancreatitis History of chronic alcoholic pancreatitis Not actively drinking, last drink 1 year ago Lipase initially 2106, now 80 Repeat CAT scan raised the possibility of pancreatic duct disruption versus rupture of peripancreatic necrotic area Diagnostic paracentesis ordered however could not be performed due to lack of safe window Started on meropenem per GI recommendations 2/25: Postpyloric DHT in place.  RD contacted for tube feeding initiation Plan: Continue tube feeds Continue meropenem Continue IV fluids Pain control as needed Serial abdominal examinations Daily labs Monitor fever curve  Nicotine dependence Counseling offered during this admission Pt declined nicotine patch  History of dural venous sinus thrombosis Chronic anticoagulation Continue Xarelto  Skin rash Improving Occurred after narcotic administration Plan: As needed Benadryl Avoid narcotics  DVT prophylaxis: Xarelto Code Status: Full Family Communication: Wife at bedside 2/23 Disposition Plan: Status is: Inpatient  Remains inpatient appropriate because:Inpatient level of care appropriate due to severity of illness   Dispo: The patient is from: Home              Anticipated d/c is to: Home              Patient currently is not medically stable to d/c.   Difficult to place patient No   Acute on chronic pancreatitis.  Complex presentation.  Persistent leukocytosis to trending down.  NG tube in place and patient on tube feeds.  Disposition plan pending.                Level of care:  Med-Surg  Consultants:   GI  Procedures:   None  Antimicrobials:   Meropenem   Subjective: Patient seen and examined.  Endorsing fatigue.  States back pain is intermittent  Objective: Vitals:   01/04/21 1937 01/04/21 2311 01/05/21 0650 01/05/21 0726  BP: (!) 145/83 (!) 154/92 136/82 130/67  Pulse: 97 (!) 106 (!) 109 100  Resp: 19 17 18 17   Temp: 98.4 F (36.9 C) 99.7 F (37.6 C) 99.9 F (37.7 C) 99.7 F (37.6 C)  TempSrc: Oral Oral Oral Oral  SpO2: 97% 99% 93% 94%  Weight:      Height:        Intake/Output Summary (Last 24 hours) at 01/05/2021 0932 Last data filed at 01/05/2021 0201 Gross per 24 hour  Intake 3019.13 ml  Output 500 ml  Net 2519.13 ml   Filed Weights   12/29/20 0732 01/03/21 1613  Weight: 63.5 kg 73 kg    Examination:  General exam: Appears calm and comfortable  Respiratory system: Clear to auscultation. Respiratory effort normal. Cardiovascular system: S1 & S2 heard, RRR. No JVD, murmurs, rubs, gallops or clicks. No pedal edema. Gastrointestinal system: Nondistended, tender to palpation left upper quadrant, normal bowel sounds Central nervous system: Alert and oriented. No focal neurological deficits. Extremities: Symmetric 5 x 5 power. Skin: No rashes, lesions or ulcers Psychiatry: Judgement and insight appear normal. Mood & affect appropriate.     Data Reviewed: I have personally reviewed following labs and imaging studies  CBC: Recent Labs  Lab 01/01/21 0548 01/02/21 0357 01/03/21 0635 01/04/21 0427 01/05/21 0333  WBC 18.0* 16.3* 17.9* 17.8* 17.1*  NEUTROABS  --   --  13.1* 13.7* 13.3*  HGB 12.0* 10.9* 10.9* 10.3* 9.7*  HCT 34.3* 32.1* 32.0* 30.4* 28.5*  MCV 91.5 91.7 91.7 92.1 91.9  PLT 146* 168 181 205 220   Basic Metabolic Panel: Recent Labs  Lab 12/30/20 0415 01/02/21 0357 01/03/21 0635 01/04/21 0427 01/05/21 0333  NA 136 136 135 135 136  K 3.8 3.2* 3.2* 3.4* 3.1*  CL 103 101 100 103 103  CO2 24 25 24  21* 24   GLUCOSE 121* 113* 97 95 117*  BUN 10 5* 6 7 8   CREATININE 0.66 0.61 0.66 0.67 0.63  CALCIUM 8.3* 7.7* 7.8* 7.7* 7.7*  MG  --   --   --   --  1.6*  PHOS  --   --   --   --  3.2   GFR: Estimated Creatinine Clearance: 134.3 mL/min (by C-G formula based on SCr of 0.63 mg/dL). Liver Function Tests: Recent Labs  Lab 01/03/21 0635 01/04/21 0427 01/05/21 0333  AST 20 23 37  ALT 14 14 19   ALKPHOS 55 61 68  BILITOT 1.1 1.3* 0.8  PROT 4.8* 4.9* 4.9*  ALBUMIN 2.1* 2.1* 2.1*   Recent Labs  Lab 12/31/20 0332 01/01/21 0548 01/02/21 0357  LIPASE 622* 185* 79*   No results for input(s): AMMONIA in the last 168 hours. Coagulation Profile: No results for input(s): INR, PROTIME in the last 168 hours. Cardiac Enzymes: No results for input(s): CKTOTAL, CKMB, CKMBINDEX, TROPONINI in the last 168 hours. BNP (last 3 results) No results for input(s): PROBNP in the last 8760 hours. HbA1C: No results for input(s): HGBA1C  in the last 72 hours. CBG: No results for input(s): GLUCAP in the last 168 hours. Lipid Profile: No results for input(s): CHOL, HDL, LDLCALC, TRIG, CHOLHDL, LDLDIRECT in the last 72 hours. Thyroid Function Tests: No results for input(s): TSH, T4TOTAL, FREET4, T3FREE, THYROIDAB in the last 72 hours. Anemia Panel: Recent Labs    01/04/21 0427 01/04/21 0927  FOLATE  --  15.2  FERRITIN 732*  --   TIBC  --  104*  IRON  --  21*   Sepsis Labs: No results for input(s): PROCALCITON, LATICACIDVEN in the last 168 hours.  Recent Results (from the past 240 hour(s))  SARS CORONAVIRUS 2 (TAT 6-24 HRS) Nasopharyngeal Nasopharyngeal Swab     Status: None   Collection Time: 12/29/20 11:24 AM   Specimen: Nasopharyngeal Swab  Result Value Ref Range Status   SARS Coronavirus 2 NEGATIVE NEGATIVE Final    Comment: (NOTE) SARS-CoV-2 target nucleic acids are NOT DETECTED.  The SARS-CoV-2 RNA is generally detectable in upper and lower respiratory specimens during the acute phase of  infection. Negative results do not preclude SARS-CoV-2 infection, do not rule out co-infections with other pathogens, and should not be used as the sole basis for treatment or other patient management decisions. Negative results must be combined with clinical observations, patient history, and epidemiological information. The expected result is Negative.  Fact Sheet for Patients: HairSlick.no  Fact Sheet for Healthcare Providers: quierodirigir.com  This test is not yet approved or cleared by the [place] FDA and  has been authorized for detection and/or diagnosis of SARS-CoV-2 by FDA under an Emergency Use Authorization (EUA). This EUA will remain  in effect (meaning this test can be used) for the duration of the COVID-19 declaration under Se ction 564(b)(1) of the Act, 21 U.S.C. section 360bbb-3(b)(1), unless the authorization is terminated or revoked sooner.  Performed at [facility], [address] [place], [place] [address]   Urine Culture     Status: Abnormal   Collection Time: 01/01/21  9:07 AM   Specimen: Urine, Clean Catch  Result Value Ref Range Status   Specimen Description   Final    URINE, CLEAN CATCH Performed at [facility], [address], [place], [place] [address]    Special Requests   Final    NONE Performed at [facility], [address] [place], [place] [address]    Culture (A)  Final    <10,000 COLONIES/mL INSIGNIFICANT GROWTH Performed at [facility], [address] [place], [place] [address]    Report Status 01/02/2021 FINAL  Final  CULTURE, BLOOD (ROUTINE X 2) w Reflex to ID Panel     Status: None (Preliminary result)   Collection Time: 01/01/21  9:47 AM   Specimen: BLOOD  Result Value Ref Range Status   Specimen Description BLOOD LEFT ARM  Final   Special Requests   Final    BOTTLES DRAWN AEROBIC AND ANAEROBIC Blood Culture adequate volume    Culture   Final    NO GROWTH 4 DAYS Performed at [facility], [address] [place], [place] [address]    Report Status PENDING  Incomplete  CULTURE, BLOOD (ROUTINE X 2) w Reflex to ID Panel     Status: None (Preliminary result)   Collection Time: 01/01/21  9:48 AM   Specimen: BLOOD  Result Value Ref Range Status   Specimen Description BLOOD  LEFT HAND  Final   Special Requests   Final  BOTTLES DRAWN AEROBIC AND ANAEROBIC Blood Culture adequate volume   Culture   Final    NO GROWTH 4 DAYS Performed at [facility], [address] [place], [place] [address]    Report Status PENDING  Incomplete         Radiology Studies: DG Basil Dess Tube Plc W/Fl W/Rad  Result Date: 01/04/2021 CLINICAL DATA:  Dobbhoff tube placement EXAM: NASO G TUBE PLACEMENT WITH FL AND WITH RAD FLUOROSCOPY TIME:  Fluoroscopy Time:  0.2 minutes Radiation Exposure Index (if provided by the fluoroscopic device): 0.6 mGy Number of Acquired Spot Images: 0 COMPARISON:  None. FINDINGS: 8 [place] Dobbhoff tube was inserted through the right nare without difficulty. The tip of the Dobbhoff tube was advanced into the proximal jejunum and confirmed with a fluoroscopic image. Tube was secured to the nose. Patient tolerated the procedure well. No complications. IMPRESSION: Successful placement of 8 [place] Dobbhoff tube with the tip in the jejunum. Electronically Signed   By: Elige Ko   On: 01/04/2021 10:54   DG Naso G Tube Plc W/Fl W/Rad  Result Date: 01/03/2021 CLINICAL DATA:  Dobbhoff tube placement. EXAM: NASO G TUBE PLACEMENT WITH FL AND WITH RAD CONTRAST:  None. FLUOROSCOPY TIME:  Fluoroscopy Time:  0 minutes 6 seconds Radiation Exposure Index (if provided by the fluoroscopic device): 1.0 mGy Number of Acquired Spot Images: 1 COMPARISON:  Head CT 05/23/2020. FINDINGS: Attempts were made at placing a Dobbhoff tube through both nasal passages. Dobbhoff tube could not be advanced past the nasal  passages. The possibility of a process such as septal deviation or other obstructing abnormality within the nasal passages should be considered. ENT consultation suggested for further evaluation. IMPRESSION: Dobbhoff tube would not advance through either nasal passage. The possibly of a process such as septal deviation or other obstructing abnormality within the nasal passages should be considered. ENT consultation suggested for further evaluation. Electronically Signed   By: Maisie Fus  Register   On: 01/03/2021 14:27        Scheduled Meds: . ascorbic acid  500 mg Per Tube BID  . DULoxetine  20 mg Oral Daily  . feeding supplement (PROSource TF)  45 mL Per Tube BID  . free water  30 mL Per Tube Q4H  . metoprolol tartrate  50 mg Per Tube BID  . pantoprazole (PROTONIX) IV  40 mg Intravenous Q24H  . rivaroxaban  20 mg Per Tube Q supper  . vitamin A  10,000 Units Per Tube Daily  . [START ON 01/11/2021] Vitamin D (Ergocalciferol)  50,000 Units Per Tube Q7 days  . zinc sulfate  220 mg Per Tube Daily   Continuous Infusions: . feeding supplement (VITAL 1.5 CAL) 1,000 mL (01/04/21 1711)  . lactated ringers 125 mL/hr at 01/05/21 0354  . meropenem (MERREM) IV 1 g (01/05/21 0256)     LOS: 7 days    Time spent: 15 minutes    Tresa Moore, MD [facility] Pager [phone]  If 7PM-7AM, please contact night-coverage 01/05/2021, 9:32 AM

## 2021-01-06 DIAGNOSIS — K861 Other chronic pancreatitis: Secondary | ICD-10-CM | POA: Diagnosis not present

## 2021-01-06 DIAGNOSIS — K859 Acute pancreatitis without necrosis or infection, unspecified: Secondary | ICD-10-CM | POA: Diagnosis not present

## 2021-01-06 LAB — GLUCOSE, CAPILLARY
Glucose-Capillary: 134 mg/dL — ABNORMAL HIGH (ref 70–99)
Glucose-Capillary: 119 mg/dL — ABNORMAL HIGH (ref 70–99)
Glucose-Capillary: 135 mg/dL — ABNORMAL HIGH (ref 70–99)
Glucose-Capillary: 123 mg/dL — ABNORMAL HIGH (ref 70–99)
Glucose-Capillary: 88 mg/dL (ref 70–99)
Glucose-Capillary: 134 mg/dL — ABNORMAL HIGH (ref 70–99)

## 2021-01-06 LAB — COMPREHENSIVE METABOLIC PANEL
ALT: 23 U/L (ref 0–44)
AST: 38 U/L (ref 15–41)
Albumin: 1.9 g/dL — ABNORMAL LOW (ref 3.5–5.0)
Alkaline Phosphatase: 59 U/L (ref 38–126)
Anion gap: 7 (ref 5–15)
BUN: 7 mg/dL (ref 6–20)
CO2: 26 mmol/L (ref 22–32)
Calcium: 7.6 mg/dL — ABNORMAL LOW (ref 8.9–10.3)
Chloride: 103 mmol/L (ref 98–111)
Creatinine, Ser: 0.56 mg/dL — ABNORMAL LOW (ref 0.61–1.24)
GFR, Estimated: >60 mL/min (ref >60)
Glucose, Bld: 130 mg/dL — ABNORMAL HIGH (ref 70–99)
Potassium: 3.5 mmol/L (ref 3.5–5.1)
Sodium: 136 mmol/L (ref 135–145)
Total Bilirubin: 0.5 mg/dL (ref 0.3–1.2)
Total Protein: 4.6 g/dL — ABNORMAL LOW (ref 6.5–8.1)

## 2021-01-06 LAB — CULTURE, BLOOD (ROUTINE X 2)
Culture: NO GROWTH
Culture: NO GROWTH
Special Requests: ADEQUATE
Special Requests: ADEQUATE

## 2021-01-06 LAB — CBC WITH DIFFERENTIAL/PLATELET
Abs Immature Granulocytes: 0.12 10*3/uL — ABNORMAL HIGH (ref 0.00–0.07)
Basophils Absolute: 0 10*3/uL (ref 0.0–0.1)
Basophils Relative: 0 %
Eosinophils Absolute: 0.6 10*3/uL — ABNORMAL HIGH (ref 0.0–0.5)
Eosinophils Relative: 4 %
HCT: 26.6 % — ABNORMAL LOW (ref 39.0–52.0)
Hemoglobin: 9.2 g/dL — ABNORMAL LOW (ref 13.0–17.0)
Immature Granulocytes: 1 %
Lymphocytes Relative: 8 %
Lymphs Abs: 1 10*3/uL (ref 0.7–4.0)
MCH: 31.6 pg (ref 26.0–34.0)
MCHC: 34.6 g/dL (ref 30.0–36.0)
MCV: 91.4 fL (ref 80.0–100.0)
Monocytes Absolute: 1.2 10*3/uL — ABNORMAL HIGH (ref 0.1–1.0)
Monocytes Relative: 9 %
Neutro Abs: 10.8 10*3/uL — ABNORMAL HIGH (ref 1.7–7.7)
Neutrophils Relative %: 78 %
Platelets: 271 10*3/uL (ref 150–400)
RBC: 2.91 MIL/uL — ABNORMAL LOW (ref 4.22–5.81)
RDW: 12.9 % (ref 11.5–15.5)
WBC: 13.7 10*3/uL — ABNORMAL HIGH (ref 4.0–10.5)
nRBC: 0 % (ref 0.0–0.2)

## 2021-01-06 LAB — MAGNESIUM: Magnesium: 1.7 mg/dL (ref 1.7–2.4)

## 2021-01-06 LAB — PHOSPHORUS: Phosphorus: 2.7 mg/dL (ref 2.5–4.6)

## 2021-01-06 LAB — VITAMIN B12: Vitamin B-12: 415 pg/mL (ref 180–914)

## 2021-01-06 MED ORDER — MAGNESIUM SULFATE 2 GM/50ML IV SOLN
2.0000 g | Freq: Once | INTRAVENOUS | Status: AC
  Administered 2021-01-06: 2 g via INTRAVENOUS
  Filled 2021-01-06: qty 50

## 2021-01-06 MED ORDER — PANCRELIPASE (LIP-PROT-AMYL) 12000-38000 UNITS PO CPEP
72000.0000 [IU] | ORAL_CAPSULE | Freq: Three times a day (TID) | ORAL | Status: DC
  Administered 2021-01-06: 72000 [IU] via ORAL
  Filled 2021-01-06 (×2): qty 6

## 2021-01-06 MED ORDER — POTASSIUM CHLORIDE 20 MEQ PO PACK
40.0000 meq | PACK | Freq: Once | ORAL | Status: AC
  Administered 2021-01-06: 40 meq
  Filled 2021-01-06: qty 2

## 2021-01-06 NOTE — Progress Notes (Signed)
[name], MD [address]  [address]  [place], [place] [address]  Main: [phone]  Fax: [phone] Pager: [phone]   Subjective: Patient continues to do well with jejunal tube feeds.  Has been afebrile for 48 hours and leukocytosis is improving.  He does not have any GI concerns today   Objective: Vital signs in last 24 hours: Vitals:   01/06/21 0421 01/06/21 0730 01/06/21 1120 01/06/21 1507  BP: (!) 176/98 138/71 135/78 (!) 148/82  Pulse: (!) 109 89 86 89  Resp: 17 20 16 17   Temp: 99.4 F (37.4 C) 98.4 F (36.9 C) 98.7 F (37.1 C) 98.2 F (36.8 C)  TempSrc: Oral  Oral   SpO2: 97% 95% 97% 99%  Weight:      Height:       Weight change:   Intake/Output Summary (Last 24 hours) at 01/06/2021 1726 Last data filed at 01/06/2021 0530 Gross per 24 hour  Intake -  Output 1000 ml  Net -1000 ml     Exam: Heart:: Regular rate and rhythm, S1S2 present or without murmur or extra heart sounds Lungs: normal and clear to auscultation Abdomen: soft, nontender, normal bowel sounds   Lab Results: CBC Latest Ref Rng & Units 01/06/2021 01/05/2021 01/04/2021  WBC 4.0 - 10.5 K/uL 13.7(H) 17.1(H) 17.8(H)  Hemoglobin 13.0 - 17.0 g/dL 01/06/2021) 2.0(F) 10.3(L)  Hematocrit 39.0 - 52.0 % 26.6(L) 28.5(L) 30.4(L)  Platelets 150 - 400 K/uL 271 220 205   CMP Latest Ref Rng & Units 01/06/2021 01/05/2021 01/04/2021  Glucose 70 - 99 mg/dL 01/06/2021) 121(F) 95  BUN 6 - 20 mg/dL 7 8 7   Creatinine 0.61 - 1.24 mg/dL 758(I) 3.25(Q  Sodium 135 - 145 mmol/L 136 136 135  Potassium 3.5 - 5.1 mmol/L 3.5 3.1(L) 3.4(L)  Chloride 98 - 111 mmol/L 103 103 103  CO2 22 - 32 mmol/L 26 24 21(L)  Calcium 8.9 - 10.3 mg/dL 7.6(L) 7.7(L) 7.7(L)  Total Protein 6.5 - 8.1 g/dL 4.6(L) 4.9(L) 4.9(L)  Total Bilirubin 0.3 - 1.2 mg/dL 0.5 0.8 9.82)  Alkaline Phos 38 - 126 U/L 59 68 61  AST 15 - 41 U/L 38 37 23  ALT 0 - 44 U/L 23 19 14     Micro Results: Recent Results (from the past 240 hour(s))   SARS CORONAVIRUS 2 (TAT 6-24 HRS) Nasopharyngeal Nasopharyngeal Swab     Status: None   Collection Time: 12/29/20 11:24 AM   Specimen: Nasopharyngeal Swab  Result Value Ref Range Status   SARS Coronavirus 2 NEGATIVE NEGATIVE Final    Comment: (NOTE) SARS-CoV-2 target nucleic acids are NOT DETECTED.  The SARS-CoV-2 RNA is generally detectable in upper and lower respiratory specimens during the acute phase of infection. Negative results do not preclude SARS-CoV-2 infection, do not rule out co-infections with other pathogens, and should not be used as the sole basis for treatment or other patient management decisions. Negative results must be combined with clinical observations, patient history, and epidemiological information. The expected result is Negative.  Fact Sheet for Patients: 5.8(X  Fact Sheet for Healthcare Providers:  This test is not yet approved or cleared by the 12/31/20 FDA and  has been authorized for detection and/or diagnosis of SARS-CoV-2 by FDA under an Emergency Use Authorization (EUA). This EUA will remain  in effect (meaning this test can be used) for the duration of the COVID-19 declaration under Se ction 564(b)(1) of the Act, 21 U.S.C. section 360bbb-3(b)(1), unless the  authorization is terminated or revoked sooner.  Performed at [facility], [address], [place], [place] [address]   Urine Culture     Status: Abnormal   Collection Time: 01/01/21  9:07 AM   Specimen: Urine, Clean Catch  Result Value Ref Range Status   Specimen Description   Final    URINE, CLEAN CATCH Performed at [facility] [place], [address], [place], [place] [address]    Special Requests   Final    NONE Performed at [facility], [address] [place], [place] [address]    Culture (A)  Final    <10,000 COLONIES/mL INSIGNIFICANT GROWTH Performed at [facility], [address], [place], [place] [address]    Report Status 01/02/2021 FINAL  Final  CULTURE, BLOOD (ROUTINE X 2) w Reflex to ID Panel     Status: None   Collection Time: 01/01/21  9:47 AM   Specimen: BLOOD  Result Value Ref Range Status   Specimen Description BLOOD LEFT ARM  Final   Special Requests   Final    BOTTLES DRAWN AEROBIC AND ANAEROBIC Blood Culture adequate volume   Culture   Final    NO GROWTH 5 DAYS Performed at [facility], [address] [place], [place] [address]    Report Status 01/06/2021 FINAL  Final  CULTURE, BLOOD (ROUTINE X 2) w Reflex to ID Panel     Status: None   Collection Time: 01/01/21  9:48 AM   Specimen: BLOOD  Result Value Ref Range Status   Specimen Description BLOOD  LEFT HAND  Final   Special Requests   Final    BOTTLES DRAWN AEROBIC AND ANAEROBIC Blood Culture adequate volume   Culture   Final    NO GROWTH 5 DAYS Performed at [facility], [address], [place], [place] [address]    Report Status 01/06/2021 FINAL  Final   Studies/Results: No results found. Medications:  I have reviewed the patient's current medications. Prior to Admission:  Medications Prior to Admission  Medication Sig Dispense Refill Last Dose  . ascorbic acid (VITAMIN C) 500 MG tablet Take 1 tablet (500 mg total) by mouth 2 (two) times daily. 180 tablet 1 12/28/2020 at Unknown time  . diphenhydrAMINE (BENADRYL) 25 mg capsule Take 25 mg by mouth every 6 (six) hours as needed.   12/29/2020 at Unknown time  . DULoxetine (CYMBALTA) 20 MG capsule Take 1 capsule (20 mg total) by mouth daily. 30 capsule 3 12/28/2020 at Unknown time  . ferrous fumarate-b12-vitamic C-folic acid (TRINSICON / FOLTRIN) capsule Take 1 capsule by mouth 2 (two) times daily after a meal. 90 capsule 3 12/28/2020 at Unknown time  . gabapentin (NEURONTIN) 100 MG capsule Take 200 mg by mouth in the morning and at bedtime.   12/28/2020 at Unknown time  . ibuprofen (ADVIL) 200 MG  tablet Take 200 mg by mouth every 4 (four) hours as needed for mild pain or moderate pain.   12/28/2020 at Unknown time  . metoprolol tartrate (LOPRESSOR) 50 MG tablet Take 1 tablet (50 mg total) by mouth 2 (two) times daily. 60 tablet 1 12/28/2020 at Unknown time  . Multiple Vitamin (MULTIVITAMIN WITH MINERALS) TABS tablet Take 1 tablet by mouth daily. 90 tablet 1 12/28/2020 at Unknown time  . multivitamin-lutein (OCUVITE-LUTEIN) CAPS capsule Take 1 capsule by mouth daily. 90 capsule 1 12/28/2020 at Unknown time  . OLANZapine zydis (ZYPREXA) 5 MG disintegrating tablet Take 1 tablet (  5 mg total) by mouth at bedtime. 30 tablet 0 12/28/2020 at Unknown time  . [EXPIRED] oxyCODONE-acetaminophen (PERCOCET) 5-325 MG tablet Take 1 tablet by mouth every 8 (eight) hours as needed for up to 5 days for severe pain. 15 tablet 0 12/28/2020 at Unknown time  . pantoprazole (PROTONIX) 40 MG tablet Take 1 tablet (40 mg total) by mouth daily. 90 tablet 1 12/28/2020 at Unknown time  . rivaroxaban (XARELTO) 20 MG TABS tablet Take 1 tablet (20 mg total) by mouth daily with supper. 90 tablet 1 12/28/2020 at Unknown time  . rOPINIRole (REQUIP) 0.5 MG tablet Take 0.5 mg by mouth at bedtime.     . traZODone (DESYREL) 50 MG tablet Take 50 mg by mouth at bedtime.     . vitamin A 3 MG (10000 UNITS) capsule Take 1 capsule (10,000 Units total) by mouth daily. 30 capsule 0 12/28/2020 at Unknown time  . Vitamin D, Ergocalciferol, (DRISDOL) 1.25 MG (50000 UNIT) CAPS capsule Take 1 capsule (50,000 Units total) by mouth every 7 (seven) days. 7 capsule 0 12/28/2020 at Unknown time  . zinc sulfate 220 (50 Zn) MG capsule Take 1 capsule (220 mg total) by mouth daily. 90 capsule 1 12/28/2020 at Unknown time  . feeding supplement, ENSURE ENLIVE, (ENSURE ENLIVE) LIQD Take 237 mLs by mouth 2 (two) times daily between meals. 237 mL 12    Scheduled: . ascorbic acid  500 mg Per Tube BID  . feeding supplement (PROSource TF)  45 mL Per Tube BID  . free  water  30 mL Per Tube Q4H  . metoprolol tartrate  50 mg Per Tube BID  . pantoprazole (PROTONIX) IV  40 mg Intravenous Q24H  . rivaroxaban  20 mg Per Tube Q supper  . vitamin A  10,000 Units Per Tube Daily  . [START ON 01/11/2021] Vitamin D (Ergocalciferol)  50,000 Units Per Tube Q7 days  . zinc sulfate  220 mg Per Tube Daily   Continuous: . feeding supplement (VITAL 1.5 CAL) 60 mL/hr at 01/05/21 2301  . lactated ringers 75 mL/hr at 01/06/21 1238  . meropenem (MERREM) IV 1 g (01/06/21 1151)   YKZ:[id number], diphenhydrAMINE, ketorolac, LORazepam, methocarbamol, ondansetron **OR** ondansetron (ZOFRAN) IV, oxymetazoline Anti-infectives (From admission, onward)   Start     Dose/Rate Route Frequency Ordered Stop   01/01/21 1800  meropenem (MERREM) 1 g in sodium chloride 0.9 % 100 mL IVPB        1 g 200 mL/hr over 30 Minutes Intravenous Every 8 hours 01/01/21 1659     01/01/21 1730  meropenem (MERREM) 500 mg in sodium chloride 0.9 % 100 mL IVPB  Status:  Discontinued        500 mg 200 mL/hr over 30 Minutes Intravenous Every 8 hours 01/01/21 1638 01/01/21 1658     Scheduled Meds: . ascorbic acid  500 mg Per Tube BID  . feeding supplement (PROSource TF)  45 mL Per Tube BID  . free water  30 mL Per Tube Q4H  . metoprolol tartrate  50 mg Per Tube BID  . pantoprazole (PROTONIX) IV  40 mg Intravenous Q24H  . rivaroxaban  20 mg Per Tube Q supper  . vitamin A  10,000 Units Per Tube Daily  . [START ON 01/11/2021] Vitamin D (Ergocalciferol)  50,000 Units Per Tube Q7 days  . zinc sulfate  220 mg Per Tube Daily   Continuous Infusions: . feeding supplement (VITAL 1.5 CAL) 60 mL/hr at 01/05/21 2301  . lactated ringers  75 mL/hr at 01/06/21 1238  . meropenem (MERREM) IV 1 g (01/06/21 1151)   PRN Meds:.acetaminophen, diphenhydrAMINE, ketorolac, LORazepam, methocarbamol, ondansetron **OR** ondansetron (ZOFRAN) IV, oxymetazoline   Assessment: Principal Problem:   Acute on chronic pancreatitis  (HCC) Active Problems:   Nicotine dependence   Malnutrition of moderate degree   Plan: Acute on chronic pancreatitis with possibility of pancreatic duct disruption versus rupture of peripancreatic necrotic area Fever has subsided in last 48 hours Leukocytosis is improving Continue meropenem Continue postpyloric jejunal feeds, patient is currently tolerating well Nutritional labs are pending Recommend to start pancreatic enzymes via tube feeds, can be transitioned to Creon 72 K 3 times daily when patient resumes oral intake Patient will need follow-up with advanced endoscopist upon discharge  Dr. Norma Fredricksonoledo will cover from tomorrow   LOS: 8 days   Rohini Vanga 01/06/2021, 5:26 PM

## 2021-01-06 NOTE — Consult Note (Signed)
PHARMACY CONSULT NOTE  Pharmacy Consult for Electrolyte Monitoring and Replacement   Recent Labs: Potassium (mmol/L)  Date Value  01/06/2021 3.5   Magnesium (mg/dL)  Date Value  88/87/5797 1.7   Calcium (mg/dL)  Date Value  28/20/6015 7.6 (L)   Albumin (g/dL)  Date Value  61/53/7943 1.9 (L)   Phosphorus (mg/dL)  Date Value  27/61/4709 2.7   Sodium (mmol/L)  Date Value  01/06/2021 136    Assessment: Patient is a 35 y/o M with medical history including chronic alcoholic pancreatitis, history of VTE on anticoagulation, nicotine use who was admitted 2/19 with acute on chronic pancreatitis. Pharmacy consulted to assist with electrolyte monitoring and replacement as indicated.  Nutrition: Tube feeds + free water flushes 30 mL q4h MIVF: LR at 100 mL/hr  Goal of Therapy:  Electrolytes within normal limits  Plan:  2/27 AM labs: K 3.5, Mg 1.7, Phos 2.7 --KCl 40 mEq per tube x 1 --IV magnesium sulfate 2 g x 1 --Phosphorous trending lower, monitor --Follow-up electrolytes with AM labs  Tressie Ellis 01/06/2021 7:25 AM

## 2021-01-06 NOTE — Progress Notes (Signed)
PROGRESS NOTE    Shane Moore Sr.  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 He describes the pain as bandlike across his abdomen and radiating to the back associated with nausea but no vomiting. He hashad multiple episodes of pancreatitis related to alcohol use in the past but states that he hasabstain from further alcohol use since his last hospitalization about a year ago. He was seen in the emergency room about 5 days ago for abdominal pain and was diagnosed with mild pancreatitis and discharged home with a prescription for oxycodone. He was told to be on a liquid diet and to return to the emergency room if his symptoms worsened.Patient states that he took  2-3  tablets of the oxycodone 10 mg andthe next day developed a generalized erythematous macular rash which is nonpruritic. He stopped taking the oxycodone but the rash has persisted.  2/20- lipase elevated 2106, covid negative. Reports rash better but still persistent. 2/22-low grade fever this am. Tmax 100.6 2/23: Improved pain control.  Continues to spike low-grade temperatures.  100.5 noted on 2/22 at 1956.  Patient still well.  Endorses hunger.  Remains NPO.  Diagnostic paracentesis cannot be performed due to lack of safe window. 2/25: After initial unsuccessful IR guided Dobbhoff tube placement I contacted ENT who scoped the patient's nose at bedside.  Found the right nare to be patent without deviation.  Interventional radiology contacted again and patient underwent successful placement of postpyloric feeding tube.  RD contacted for tube feeding initiation. 2/26:  NG tube in place and tube feeds initiated.  Slowly escalating up to goal.  Patient remains hemodynamically stable.  No fevers reported over past 24 hours.  White count remains elevated slowly trending down 2/27: Fever free last 48 hours.  White count trending down.  Patient is worsening fatigue    Assessment & Plan:   Principal Problem:   Acute on chronic pancreatitis (HCC) Active Problems:   Nicotine dependence   Malnutrition of moderate degree  Acute on chronic pancreatitis History of chronic alcoholic pancreatitis Not actively drinking, last drink 1 year ago Lipase initially 2106, now 80 Repeat CAT scan raised the possibility of pancreatic duct disruption versus rupture of peripancreatic necrotic area Diagnostic paracentesis ordered however could not be performed due to lack of safe window Started on meropenem per GI recommendations 2/27: Pyloric DHT in place.  Patient tolerating tube feeds.  White count trending down Plan: Continue tube feeds Continue meropenem Continue IV fluids Pain control as needed Serial abdominal examinations Daily labs Monitor fever curve Discussed with pharmacy.  Creon via NGT requires special formulation.   Will discuss with RD tomorrow.  Nicotine dependence Counseling offered during this admission Pt declined nicotine patch  History of dural venous sinus thrombosis Chronic anticoagulation Continue Xarelto  Skin rash Improving Occurred after narcotic administration Plan: As needed Benadryl Avoid narcotics  DVT prophylaxis: Xarelto Code Status: Full Family Communication: Wife at bedside 2/27 Disposition Plan: Status is: Inpatient  Remains inpatient appropriate because:Inpatient level of care appropriate due to severity of illness   Dispo: The patient is from: Home              Anticipated d/c is  to: Home              Patient currently is not medically stable to d/c.   Difficult to place patient No   Acute on chronic  pancreatitis. DHT in place, tube feeds running.  White count trending down.  Disposition plan hopefully within 48 hours                Level of care: Med-Surg  Consultants:   GI  Procedures:   None  Antimicrobials:   Meropenem   Subjective: Patient seen and examined.  Just endorsing fatigue.  Intermittent back pain though improving overall.  Objective: Vitals:   01/06/21 0101 01/06/21 0421 01/06/21 0730 01/06/21 1120  BP: 135/89 (!) 176/98 138/71 135/78  Pulse: 96 (!) 109 89 86  Resp: 17 17 20 16   Temp: 99.2 F (37.3 C) 99.4 F (37.4 C) 98.4 F (36.9 C) 98.7 F (37.1 C)  TempSrc: Oral Oral  Oral  SpO2: 97% 97% 95% 97%  Weight:      Height:        Intake/Output Summary (Last 24 hours) at 01/06/2021 1121 Last data filed at 01/06/2021 0530 Gross per 24 hour  Intake 2261.75 ml  Output 1750 ml  Net 511.75 ml   Filed Weights   12/29/20 0732 01/03/21 1613  Weight: 63.5 kg 73 kg    Examination:  General exam: Appears calm and comfortable  Respiratory system: Clear to auscultation. Respiratory effort normal. Cardiovascular system: S1 & S2 heard, RRR. No JVD, murmurs, rubs, gallops or clicks. No pedal edema. Gastrointestinal system: Nondistended, tender to palpation left upper quadrant, normal bowel sounds Central nervous system: Alert and oriented. No focal neurological deficits. Extremities: Symmetric 5 x 5 power. Skin: No rashes, lesions or ulcers Psychiatry: Judgement and insight appear normal. Mood & affect appropriate.     Data Reviewed: I have personally reviewed following labs and imaging studies  CBC: Recent Labs  Lab 01/02/21 0357 01/03/21 0635 01/04/21 0427 01/05/21 0333 01/06/21 0541  WBC 16.3* 17.9* 17.8* 17.1* 13.7*  NEUTROABS  --  13.1* 13.7* 13.3* 10.8*  HGB 10.9* 10.9* 10.3* 9.7* 9.2*  HCT 32.1* 32.0* 30.4* 28.5* 26.6*  MCV 91.7 91.7 92.1 91.9 91.4  PLT 168 181 205 220 271   Basic Metabolic Panel: Recent Labs  Lab  01/02/21 0357 01/03/21 0635 01/04/21 0427 01/05/21 0333 01/06/21 0541  NA 136 135 135 136 136  K 3.2* 3.2* 3.4* 3.1* 3.5  CL 101 100 103 103 103  CO2 25 24 21* 24 26  GLUCOSE 113* 97 95 117* 130*  BUN 5* 6 7 8 7   CREATININE 0.61 0.66 0.67 0.63 0.56*  CALCIUM 7.7* 7.8* 7.7* 7.7* 7.6*  MG  --   --   --  1.6* 1.7  PHOS  --   --   --  3.2 2.7   GFR: Estimated Creatinine Clearance: 134.3 mL/min (A) (by C-G formula based on SCr of 0.56 mg/dL (L)). Liver Function Tests: Recent Labs  Lab 01/03/21 0635 01/04/21 0427 01/05/21 0333 01/06/21 0541  AST 20 23 37 38  ALT 14 14 19 23   ALKPHOS 55 61 68 59  BILITOT 1.1 1.3* 0.8 0.5  PROT 4.8* 4.9* 4.9* 4.6*  ALBUMIN 2.1* 2.1* 2.1* 1.9*   Recent Labs  Lab 12/31/20 0332 01/01/21 0548 01/02/21 0357  LIPASE 622* 185* 79*   No results for input(s): AMMONIA in the last 168 hours. Coagulation Profile: No results for input(s): INR, PROTIME in the last 168  hours. Cardiac Enzymes: No results for input(s): CKTOTAL, CKMB, CKMBINDEX, TROPONINI in the last 168 hours. BNP (last 3 results) No results for input(s): PROBNP in the last 8760 hours. HbA1C: No results for input(s): HGBA1C in the last 72 hours. CBG: Recent Labs  Lab 01/05/21 1712 01/05/21 2034 01/06/21 0102 01/06/21 0456 01/06/21 0805  GLUCAP 113* 115* 134* 119* 135*   Lipid Profile: No results for input(s): CHOL, HDL, LDLCALC, TRIG, CHOLHDL, LDLDIRECT in the last 72 hours. Thyroid Function Tests: No results for input(s): TSH, T4TOTAL, FREET4, T3FREE, THYROIDAB in the last 72 hours. Anemia Panel: Recent Labs    01/04/21 0427 01/04/21 0927  FOLATE  --  15.2  FERRITIN 732*  --   TIBC  --  104*  IRON  --  21*   Sepsis Labs: No results for input(s): PROCALCITON, LATICACIDVEN in the last 168 hours.  Recent Results (from the past 240 hour(s))  SARS CORONAVIRUS 2 (TAT 6-24 HRS) Nasopharyngeal Nasopharyngeal Swab     Status: None   Collection Time: 12/29/20 11:24 AM    Specimen: Nasopharyngeal Swab  Result Value Ref Range Status   SARS Coronavirus 2 NEGATIVE NEGATIVE Final    Comment: (NOTE) SARS-CoV-2 target nucleic acids are NOT DETECTED.  The SARS-CoV-2 RNA is generally detectable in upper and lower respiratory specimens during the acute phase of infection. Negative results do not preclude SARS-CoV-2 infection, do not rule out co-infections with other pathogens, and should not be used as the sole basis for treatment or other patient management decisions. Negative results must be combined with clinical observations, patient history, and epidemiological information. The expected result is Negative.  Fact Sheet for Patients: HairSlick.no  Fact Sheet for Healthcare Providers: quierodirigir.com  This test is not yet approved or cleared by the [place] FDA and  has been authorized for detection and/or diagnosis of SARS-CoV-2 by FDA under an Emergency Use Authorization (EUA). This EUA will remain  in effect (meaning this test can be used) for the duration of the COVID-19 declaration under Se ction 564(b)(1) of the Act, 21 U.S.C. section 360bbb-3(b)(1), unless the authorization is terminated or revoked sooner.  Performed at [facility], [address] [place], [place] [address]   Urine Culture     Status: Abnormal   Collection Time: 01/01/21  9:07 AM   Specimen: Urine, Clean Catch  Result Value Ref Range Status   Specimen Description   Final    URINE, CLEAN CATCH Performed at [facility], [address] [place], [place] [address]    Special Requests   Final    NONE Performed at [facility], [address] [place], [place] [address]    Culture (A)  Final    <10,000 COLONIES/mL INSIGNIFICANT GROWTH Performed at [facility], [address] [place], [place] [address]    Report Status 01/02/2021 FINAL  Final  CULTURE, BLOOD (ROUTINE X 2) w Reflex  to ID Panel     Status: None   Collection Time: 01/01/21  9:47 AM   Specimen: BLOOD  Result Value Ref Range Status   Specimen Description BLOOD LEFT ARM  Final   Special Requests   Final    BOTTLES DRAWN AEROBIC AND ANAEROBIC Blood Culture adequate volume   Culture   Final    NO GROWTH 5 DAYS Performed at [facility], [address], [place], [place] [address]    Report Status 01/06/2021 FINAL  Final  CULTURE, BLOOD (ROUTINE X 2) w Reflex  to ID Panel     Status: None   Collection Time: 01/01/21  9:48 AM   Specimen: BLOOD  Result Value Ref Range Status   Specimen Description BLOOD  LEFT HAND  Final   Special Requests   Final    BOTTLES DRAWN AEROBIC AND ANAEROBIC Blood Culture adequate volume   Culture   Final    NO GROWTH 5 DAYS Performed at [facility], [address] [place], [place] [address]    Report Status 01/06/2021 FINAL  Final         Radiology Studies: No results found.      Scheduled Meds: . ascorbic acid  500 mg Per Tube BID  . feeding supplement (PROSource TF)  45 mL Per Tube BID  . free water  30 mL Per Tube Q4H  . metoprolol tartrate  50 mg Per Tube BID  . pantoprazole (PROTONIX) IV  40 mg Intravenous Q24H  . rivaroxaban  20 mg Per Tube Q supper  . vitamin A  10,000 Units Per Tube Daily  . [START ON 01/11/2021] Vitamin D (Ergocalciferol)  50,000 Units Per Tube Q7 days  . zinc sulfate  220 mg Per Tube Daily   Continuous Infusions: . feeding supplement (VITAL 1.5 CAL) 60 mL/hr at 01/05/21 2301  . lactated ringers 100 mL/hr at 01/06/21 0303  . magnesium sulfate bolus IVPB    . meropenem (MERREM) IV 1 g (01/06/21 0201)     LOS: 8 days    Time spent: 25 minutes    Tresa Moore, MD [facility] Pager [phone]  If 7PM-7AM, please contact night-coverage 01/06/2021, 11:21 AM

## 2021-01-07 DIAGNOSIS — K861 Other chronic pancreatitis: Secondary | ICD-10-CM | POA: Diagnosis not present

## 2021-01-07 DIAGNOSIS — K859 Acute pancreatitis without necrosis or infection, unspecified: Secondary | ICD-10-CM | POA: Diagnosis not present

## 2021-01-07 LAB — COMPREHENSIVE METABOLIC PANEL
ALT: 22 U/L (ref 0–44)
AST: 28 U/L (ref 15–41)
Albumin: 2 g/dL — ABNORMAL LOW (ref 3.5–5.0)
Alkaline Phosphatase: 59 U/L (ref 38–126)
Anion gap: 8 (ref 5–15)
BUN: 9 mg/dL (ref 6–20)
CO2: 28 mmol/L (ref 22–32)
Calcium: 8 mg/dL — ABNORMAL LOW (ref 8.9–10.3)
Chloride: 102 mmol/L (ref 98–111)
Creatinine, Ser: 0.54 mg/dL — ABNORMAL LOW (ref 0.61–1.24)
GFR, Estimated: >60 mL/min (ref >60)
Glucose, Bld: 113 mg/dL — ABNORMAL HIGH (ref 70–99)
Potassium: 4.3 mmol/L (ref 3.5–5.1)
Sodium: 138 mmol/L (ref 135–145)
Total Bilirubin: 0.4 mg/dL (ref 0.3–1.2)
Total Protein: 4.9 g/dL — ABNORMAL LOW (ref 6.5–8.1)

## 2021-01-07 LAB — CBC WITH DIFFERENTIAL/PLATELET
Abs Immature Granulocytes: 0.12 10*3/uL — ABNORMAL HIGH (ref 0.00–0.07)
Basophils Absolute: 0 10*3/uL (ref 0.0–0.1)
Basophils Relative: 0 %
Eosinophils Absolute: 0.6 10*3/uL — ABNORMAL HIGH (ref 0.0–0.5)
Eosinophils Relative: 4 %
HCT: 28.8 % — ABNORMAL LOW (ref 39.0–52.0)
Hemoglobin: 9.9 g/dL — ABNORMAL LOW (ref 13.0–17.0)
Immature Granulocytes: 1 %
Lymphocytes Relative: 10 %
Lymphs Abs: 1.4 10*3/uL (ref 0.7–4.0)
MCH: 31.8 pg (ref 26.0–34.0)
MCHC: 34.4 g/dL (ref 30.0–36.0)
MCV: 92.6 fL (ref 80.0–100.0)
Monocytes Absolute: 1.3 10*3/uL — ABNORMAL HIGH (ref 0.1–1.0)
Monocytes Relative: 9 %
Neutro Abs: 10.1 10*3/uL — ABNORMAL HIGH (ref 1.7–7.7)
Neutrophils Relative %: 76 %
Platelets: 295 10*3/uL (ref 150–400)
RBC: 3.11 MIL/uL — ABNORMAL LOW (ref 4.22–5.81)
RDW: 13.1 % (ref 11.5–15.5)
WBC: 13.5 10*3/uL — ABNORMAL HIGH (ref 4.0–10.5)
nRBC: 0 % (ref 0.0–0.2)

## 2021-01-07 LAB — GLUCOSE, CAPILLARY
Glucose-Capillary: 127 mg/dL — ABNORMAL HIGH (ref 70–99)
Glucose-Capillary: 95 mg/dL (ref 70–99)
Glucose-Capillary: 115 mg/dL — ABNORMAL HIGH (ref 70–99)
Glucose-Capillary: 109 mg/dL — ABNORMAL HIGH (ref 70–99)
Glucose-Capillary: 114 mg/dL — ABNORMAL HIGH (ref 70–99)
Glucose-Capillary: 114 mg/dL — ABNORMAL HIGH (ref 70–99)

## 2021-01-07 LAB — MAGNESIUM: Magnesium: 2 mg/dL (ref 1.7–2.4)

## 2021-01-07 LAB — PHOSPHORUS: Phosphorus: 4.2 mg/dL (ref 2.5–4.6)

## 2021-01-07 MED ORDER — PROSOURCE TF PO LIQD
45.0000 mL | Freq: Every day | ORAL | Status: DC
  Administered 2021-01-08: 45 mL
  Filled 2021-01-07: qty 45

## 2021-01-07 NOTE — Consult Note (Signed)
PHARMACY CONSULT NOTE  Pharmacy Consult for Electrolyte Monitoring and Replacement   Recent Labs: Potassium (mmol/L)  Date Value  01/07/2021 4.3   Magnesium (mg/dL)  Date Value  03/88/8280 2.0   Calcium (mg/dL)  Date Value  03/49/1791 8.0 (L)   Albumin (g/dL)  Date Value  50/56/9794 2.0 (L)   Phosphorus (mg/dL)  Date Value  80/16/5537 4.2   Sodium (mmol/L)  Date Value  01/07/2021 138    Assessment: Patient is a 35 y/o M with medical history including chronic alcoholic pancreatitis, history of VTE on anticoagulation, nicotine use who was admitted 2/19 with acute on chronic pancreatitis. Pharmacy consulted to assist with electrolyte monitoring and replacement as indicated.  Nutrition: continuousTube feeds + free water flushes 30 mL q4h MIVF: LR at 75 mL/hr  Goal of Therapy:  Electrolytes within normal limits  Plan:  2/28 AM labs: K 4.3, Mg 2.0, Phos 4.2  Scr 0.54 No replacement at this time --Follow-up electrolytes with AM labs  Cassia Fein A 01/07/2021 7:32 AM

## 2021-01-07 NOTE — Progress Notes (Signed)
PROGRESS NOTE    Shane Jayson Sr.  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 He describes the pain as bandlike across his abdomen and radiating to the back associated with nausea but no vomiting. He hashad multiple episodes of pancreatitis related to alcohol use in the past but states that he hasabstain from further alcohol use since his last hospitalization about a year ago. He was seen in the emergency room about 5 days ago for abdominal pain and was diagnosed with mild pancreatitis and discharged home with a prescription for oxycodone. He was told to be on a liquid diet and to return to the emergency room if his symptoms worsened.Patient states that he took  2-3  tablets of the oxycodone 10 mg andthe next day developed a generalized erythematous macular rash which is nonpruritic. He stopped taking the oxycodone but the rash has persisted.  2/20- lipase elevated 2106, covid negative. Reports rash better but still persistent. 2/22-low grade fever this am. Tmax 100.6 2/23: Improved pain control.  Continues to spike low-grade temperatures.  100.5 noted on 2/22 at 1956.  Patient still well.  Endorses hunger.  Remains NPO.  Diagnostic paracentesis cannot be performed due to lack of safe window. 2/25: After initial unsuccessful IR guided Dobbhoff tube placement I contacted ENT who scoped the patient's nose at bedside.  Found the right nare to be patent without deviation.  Interventional radiology contacted again and patient underwent successful placement of postpyloric feeding tube.  RD contacted for tube feeding initiation. 2/26:  NG tube in place and tube feeds initiated.  Slowly escalating up to goal.  Patient remains hemodynamically stable.  No fevers reported over past 24 hours.  White count remains elevated slowly trending down 2/27: Fever free last 48 hours.  White count trending down.  Patient is worsening fatigue 2/28: 100.8 fever noted this morning.  Patient feels symptomatically okay.  Still endorsing fatigue.  White count slowly trending down.  Discussed with Dr. Norma Fredrickson.  No need for pancreatic enzyme replacement at the moment.    Assessment & Plan:   Principal Problem:   Acute on chronic pancreatitis [facility]) Active Problems:   Nicotine dependence   Malnutrition of moderate degree  Acute on chronic pancreatitis History of chronic alcoholic pancreatitis Not actively drinking, last drink 1 year ago Lipase initially 2106, now 80 Repeat CAT scan raised the possibility of pancreatic duct disruption versus rupture of peripancreatic necrotic area Diagnostic paracentesis ordered however could not be performed due to lack of safe window Started on meropenem per GI recommendations 2/27: Pyloric DHT in place.  Patient tolerating tube feeds.  White count trending down 2/28: Fever 100.8.  WBC slowly trending down Plan: Continue tube feeds Continue meropenem Continue IV fluids Pain control as needed Serial abdominal examinations Daily labs Monitor fever curve Discussed with RD and GI.  No need for Creon at this time  Nicotine dependence Counseling offered during this admission Pt declined nicotine patch  History of dural venous sinus thrombosis Chronic anticoagulation Continue Xarelto  Skin rash Improving Occurred after narcotic administration Plan: As needed Benadryl Avoid narcotics  DVT prophylaxis: Xarelto Code Status: Full Family Communication: Wife at bedside 2/27 Disposition Plan: Status  is: Inpatient  Remains inpatient appropriate because:Inpatient level of care appropriate due to  severity of illness   Dispo: The patient is from: Home              Anticipated d/c is to: Home              Patient currently is not medically stable to d/c.   Difficult to place patient No   Acute on chronic pancreatitis. DHT in place, tube feeds running.  White count trending down.  Remains npo.  Disposition plan pending    Level of care: Med-Surg  Consultants:   GI  Procedures:   None  Antimicrobials:   Meropenem   Subjective: Patient seen and examined.  Continues to endorse fatigue.  No other complaints.  Objective: Vitals:   01/06/21 1934 01/07/21 0121 01/07/21 0425 01/07/21 0721  BP: (!) 138/99 140/81 (!) 147/88 129/72  Pulse: 95 89 86 97  Resp: 18 18 18 18   Temp: 97.6 F (36.4 C) 99.2 F (37.3 C) 99.4 F (37.4 C) (!) 100.8 F (38.2 C)  TempSrc:      SpO2: 95% 93% 98% 94%  Weight:      Height:        Intake/Output Summary (Last 24 hours) at 01/07/2021 1026 Last data filed at 01/07/2021 0456 Gross per 24 hour  Intake 2246.1 ml  Output 1350 ml  Net 896.1 ml   Filed Weights   12/29/20 0732 01/03/21 1613  Weight: 63.5 kg 73 kg    Examination:  General exam: Appears calm and comfortable  Respiratory system: Clear to auscultation. Respiratory effort normal. Cardiovascular system: S1 & S2 heard, RRR. No JVD, murmurs, rubs, gallops or clicks. No pedal edema. Gastrointestinal system: Nondistended, tender to palpation left upper quadrant, normal bowel sounds Central nervous system: Alert and oriented. No focal neurological deficits. Extremities: Symmetric 5 x 5 power. Skin: No rashes, lesions or ulcers Psychiatry: Judgement and insight appear normal. Mood & affect appropriate.     Data Reviewed: I have personally reviewed following labs and imaging studies  CBC: Recent Labs  Lab 01/03/21 0635 01/04/21 0427 01/05/21 0333 01/06/21 0541 01/07/21 0509  WBC 17.9* 17.8* 17.1* 13.7* 13.5*  NEUTROABS 13.1* 13.7* 13.3* 10.8* 10.1*  HGB 10.9*  10.3* 9.7* 9.2* 9.9*  HCT 32.0* 30.4* 28.5* 26.6* 28.8*  MCV 91.7 92.1 91.9 91.4 92.6  PLT 181 205 220 271 295   Basic Metabolic Panel: Recent Labs  Lab 01/03/21 0635 01/04/21 0427 01/05/21 0333 01/06/21 0541 01/07/21 0509  NA 135 135 136 136 138  K 3.2* 3.4* 3.1* 3.5 4.3  CL 100 103 103 103 102  CO2 24 21* 24 26 28   GLUCOSE 97 95 117* 130* 113*  BUN 6 7 8 7 9   CREATININE 0.66 0.67 0.63 0.56* 0.54*  CALCIUM 7.8* 7.7* 7.7* 7.6* 8.0*  MG  --   --  1.6* 1.7 2.0  PHOS  --   --  3.2 2.7 4.2   GFR: Estimated Creatinine Clearance: 134.3 mL/min (A) (by C-G formula based on SCr of 0.54 mg/dL (L)). Liver Function Tests: Recent Labs  Lab 01/03/21 0635 01/04/21 0427 01/05/21 0333 01/06/21 0541 01/07/21 0509  AST 20 23 37 38 28  ALT 14 14 19 23 22   ALKPHOS 55 61 68 59 59  BILITOT 1.1 1.3* 0.8 0.5 0.4  PROT 4.8* 4.9* 4.9* 4.6* 4.9*  ALBUMIN 2.1* 2.1* 2.1* 1.9* 2.0*   Recent Labs  Lab 01/01/21 0548 01/02/21 0357  LIPASE  185* 79*   No results for input(s): AMMONIA in the last 168 hours. Coagulation Profile: No results for input(s): INR, PROTIME in the last 168 hours. Cardiac Enzymes: No results for input(s): CKTOTAL, CKMB, CKMBINDEX, TROPONINI in the last 168 hours. BNP (last 3 results) No results for input(s): PROBNP in the last 8760 hours. HbA1C: No results for input(s): HGBA1C in the last 72 hours. CBG: Recent Labs  Lab 01/06/21 1621 01/06/21 1935 01/07/21 0119 01/07/21 0426 01/07/21 0718  GLUCAP 88 134* 127* 95 115*   Lipid Profile: No results for input(s): CHOL, HDL, LDLCALC, TRIG, CHOLHDL, LDLDIRECT in the last 72 hours. Thyroid Function Tests: No results for input(s): TSH, T4TOTAL, FREET4, T3FREE, THYROIDAB in the last 72 hours. Anemia Panel: Recent Labs    01/06/21 1544  VITAMINB12 415   Sepsis Labs: No results for input(s): PROCALCITON, LATICACIDVEN in the last 168 hours.  Recent Results (from the past 240 hour(s))  SARS CORONAVIRUS 2 (TAT 6-24  HRS) Nasopharyngeal Nasopharyngeal Swab     Status: None   Collection Time: 12/29/20 11:24 AM   Specimen: Nasopharyngeal Swab  Result Value Ref Range Status   SARS Coronavirus 2 NEGATIVE NEGATIVE Final    Comment: (NOTE) SARS-CoV-2 target nucleic acids are NOT DETECTED.  The SARS-CoV-2 RNA is generally detectable in upper and lower respiratory specimens during the acute phase of infection. Negative results do not preclude SARS-CoV-2 infection, do not rule out co-infections with other pathogens, and should not be used as the sole basis for treatment or other patient management decisions. Negative results must be combined with clinical observations, patient history, and epidemiological information. The expected result is Negative.  Fact Sheet for Patients: HairSlick.nohttps://www.fda.gov/media/138098/download  Fact Sheet for Healthcare Providers: quierodirigir.comhttps://www.fda.gov/media/138095/download  This test is not yet approved or cleared by the [place] FDA and  has been authorized for detection and/or diagnosis of SARS-CoV-2 by FDA under an Emergency Use Authorization (EUA). This EUA will remain  in effect (meaning this test can be used) for the duration of the COVID-19 declaration under Se ction 564(b)(1) of the Act, 21 U.S.C. section 360bbb-3(b)(1), unless the authorization is terminated or revoked sooner.  Performed at [facility], [address] [place], [place] [address]   Urine Culture     Status: Abnormal   Collection Time: 01/01/21  9:07 AM   Specimen: Urine, Clean Catch  Result Value Ref Range Status   Specimen Description   Final    URINE, CLEAN CATCH Performed at [facility], [address] [place], [place] [address]    Special Requests   Final    NONE Performed at [facility], [address] [place], [place] [address]    Culture (A)  Final    <10,000 COLONIES/mL INSIGNIFICANT GROWTH Performed at [facility], [address]  [place], [place] [address]    Report Status 01/02/2021 FINAL  Final  CULTURE, BLOOD (ROUTINE X 2) w Reflex to ID Panel     Status: None   Collection Time: 01/01/21  9:47 AM   Specimen: BLOOD  Result Value Ref Range Status   Specimen Description BLOOD LEFT ARM  Final   Special Requests   Final    BOTTLES DRAWN AEROBIC AND ANAEROBIC Blood Culture adequate volume   Culture   Final    NO GROWTH 5 DAYS Performed at [facility], [address] [place], [place] [address]    Report Status 01/06/2021 FINAL  Final  CULTURE, BLOOD (ROUTINE X  2) w Reflex to ID Panel     Status: None   Collection Time: 01/01/21  9:48 AM   Specimen: BLOOD  Result Value Ref Range Status   Specimen Description BLOOD  LEFT HAND  Final   Special Requests   Final    BOTTLES DRAWN AEROBIC AND ANAEROBIC Blood Culture adequate volume   Culture   Final    NO GROWTH 5 DAYS Performed at [facility], [address], [place], [place] [address]    Report Status 01/06/2021 FINAL  Final         Radiology Studies: No results found.      Scheduled Meds: . ascorbic acid  500 mg Per Tube BID  . feeding supplement (PROSource TF)  45 mL Per Tube BID  . free water  30 mL Per Tube Q4H  . metoprolol tartrate  50 mg Per Tube BID  . pantoprazole (PROTONIX) IV  40 mg Intravenous Q24H  . rivaroxaban  20 mg Per Tube Q supper  . vitamin A  10,000 Units Per Tube Daily  . [START ON 01/11/2021] Vitamin D (Ergocalciferol)  50,000 Units Per Tube Q7 days  . zinc sulfate  220 mg Per Tube Daily   Continuous Infusions: . feeding supplement (VITAL 1.5 CAL) 60 mL/hr at 01/07/21 0008  . lactated ringers 75 mL/hr at 01/07/21 0008  . meropenem (MERREM) IV Stopped (01/07/21 0200)     LOS: 9 days    Time spent: 15 minutes    Tresa Moore, MD [facility] Pager [phone]  If 7PM-7AM, please contact night-coverage 01/07/2021, 10:26 AM

## 2021-01-07 NOTE — Progress Notes (Signed)
[facility] Gastroenterology Inpatient Progress Note  Subjective: Patient seen for followup of acute interstitial pancreatitis with pseudocysts. Patient appears stable, complaining only of low level, diffuse abdominal discomfort. Still passing flatus and having BM. No SOB, chest pain. Vitals are stable, though low grade fever spikes are recurrent. WBC improved but not normalized. Patient tolerating nasoenteric tube feedings well.  Objective: Vital signs in last 24 hours: Temp:  [97.6 F (36.4 C)-100.8 F (38.2 C)] 98.3 F (36.8 C) (02/28 1511) Pulse Rate:  [86-97] 88 (02/28 1511) Resp:  [17-18] 17 (02/28 1511) BP: (124-147)/(68-99) 129/80 (02/28 1511) SpO2:  [93 %-98 %] 95 % (02/28 1511) Blood pressure 129/80, pulse 88, temperature 98.3 F (36.8 C), resp. rate 17, height 6\' 1"  (1.854 m), weight 73 kg, SpO2 95 %.    Intake/Output from previous day: 02/27 0701 - 02/28 0700 In: 2246.1 [I.V.:666.3; NG/GT:971.4; IV Piggyback:608.4] Out: 1350 [Urine:1350]  Intake/Output this shift: Total I/O In: 1722.5 [I.V.:482.5; NG/GT:1140; IV Piggyback:100] Out: 1100 [Urine:1100]   General appearance:  Alert with NET in place. NAD Resp:  CTA Cardio:  RRR, no gallop or tachycardia GI: Soft, non-distended, minimally tender without rebound, guarding or rigidity. BS+ Extremities:  No edema   Lab Results: Results for orders placed or performed during the hospital encounter of 12/29/20 (from the past 24 hour(s))  Glucose, capillary     Status: None   Collection Time: 01/06/21  4:21 PM  Result Value Ref Range   Glucose-Capillary 88 70 - 99 mg/dL  Glucose, capillary     Status: Abnormal   Collection Time: 01/06/21  7:35 PM  Result Value Ref Range   Glucose-Capillary 134 (H) 70 - 99 mg/dL   Comment 1 Notify RN    Comment 2 Document in Chart   Glucose, capillary     Status: Abnormal   Collection Time: 01/07/21  1:19 AM  Result Value Ref Range   Glucose-Capillary 127 (H) 70 - 99 mg/dL    Comment 1 Notify RN    Comment 2 Document in Chart   Glucose, capillary     Status: None   Collection Time: 01/07/21  4:26 AM  Result Value Ref Range   Glucose-Capillary 95 70 - 99 mg/dL   Comment 1 Notify RN    Comment 2 Document in Chart   CBC with Differential/Platelet     Status: Abnormal   Collection Time: 01/07/21  5:09 AM  Result Value Ref Range   WBC 13.5 (H) 4.0 - 10.5 K/uL   RBC 3.11 (L) 4.22 - 5.81 MIL/uL   Hemoglobin 9.9 (L) 13.0 - 17.0 g/dL   HCT 01/09/21 (L) 01.7 - 51.0 %   MCV 92.6 80.0 - 100.0 fL   MCH 31.8 26.0 - 34.0 pg   MCHC 34.4 30.0 - 36.0 g/dL   RDW 25.8 52.7 - 78.2 %   Platelets 295 150 - 400 K/uL   nRBC 0.0 0.0 - 0.2 %   Neutrophils Relative % 76 %   Neutro Abs 10.1 (H) 1.7 - 7.7 K/uL   Lymphocytes Relative 10 %   Lymphs Abs 1.4 0.7 - 4.0 K/uL   Monocytes Relative 9 %   Monocytes Absolute 1.3 (H) 0.1 - 1.0 K/uL   Eosinophils Relative 4 %   Eosinophils Absolute 0.6 (H) 0.0 - 0.5 K/uL   Basophils Relative 0 %   Basophils Absolute 0.0 0.0 - 0.1 K/uL   Immature Granulocytes 1 %   Abs Immature Granulocytes 0.12 (H) 0.00 - 0.07 K/uL  Comprehensive  metabolic panel     Status: Abnormal   Collection Time: 01/07/21  5:09 AM  Result Value Ref Range   Sodium 138 135 - 145 mmol/L   Potassium 4.3 3.5 - 5.1 mmol/L   Chloride 102 98 - 111 mmol/L   CO2 28 22 - 32 mmol/L   Glucose, Bld 113 (H) 70 - 99 mg/dL   BUN 9 6 - 20 mg/dL   Creatinine, Ser 1.32 (L) 0.61 - 1.24 mg/dL   Calcium 8.0 (L) 8.9 - 10.3 mg/dL   Total Protein 4.9 (L) 6.5 - 8.1 g/dL   Albumin 2.0 (L) 3.5 - 5.0 g/dL   AST 28 15 - 41 U/L   ALT 22 0 - 44 U/L   Alkaline Phosphatase 59 38 - 126 U/L   Total Bilirubin 0.4 0.3 - 1.2 mg/dL   GFR, Estimated >44 >01 mL/min   Anion gap 8 5 - 15  Magnesium     Status: None   Collection Time: 01/07/21  5:09 AM  Result Value Ref Range   Magnesium 2.0 1.7 - 2.4 mg/dL  Phosphorus     Status: None   Collection Time: 01/07/21  5:09 AM  Result Value Ref Range    Phosphorus 4.2 2.5 - 4.6 mg/dL  Glucose, capillary     Status: Abnormal   Collection Time: 01/07/21  7:18 AM  Result Value Ref Range   Glucose-Capillary 115 (H) 70 - 99 mg/dL  Glucose, capillary     Status: Abnormal   Collection Time: 01/07/21 11:37 AM  Result Value Ref Range   Glucose-Capillary 109 (H) 70 - 99 mg/dL     Recent Labs    02/72/53 0333 01/06/21 0541 01/07/21 0509  WBC 17.1* 13.7* 13.5*  HGB 9.7* 9.2* 9.9*  HCT 28.5* 26.6* 28.8*  PLT 220 271 295   BMET Recent Labs    01/05/21 0333 01/06/21 0541 01/07/21 0509  NA 136 136 138  K 3.1* 3.5 4.3  CL 103 103 102  CO2 24 26 28   GLUCOSE 117* 130* 113*  BUN 8 7 9   CREATININE 0.63 0.56* 0.54*  CALCIUM 7.7* 7.6* 8.0*   LFT Recent Labs    01/07/21 0509  PROT 4.9*  ALBUMIN 2.0*  AST 28  ALT 22  ALKPHOS 59  BILITOT 0.4   PT/INR No results for input(s): LABPROT, INR in the last 72 hours. Hepatitis Panel No results for input(s): HEPBSAG, HCVAB, HEPAIGM, HEPBIGM in the last 72 hours. C-Diff No results for input(s): CDIFFTOX in the last 72 hours. No results for input(s): CDIFFPCR in the last 72 hours.   Studies/Results: No results found.  Scheduled Inpatient Medications:   . ascorbic acid  500 mg Per Tube BID  . [START ON 01/08/2021] feeding supplement (PROSource TF)  45 mL Per Tube Daily  . free water  30 mL Per Tube Q4H  . metoprolol tartrate  50 mg Per Tube BID  . pantoprazole (PROTONIX) IV  40 mg Intravenous Q24H  . rivaroxaban  20 mg Per Tube Q supper  . vitamin A  10,000 Units Per Tube Daily  . [START ON 01/11/2021] Vitamin D (Ergocalciferol)  50,000 Units Per Tube Q7 days  . zinc sulfate  220 mg Per Tube Daily    Continuous Inpatient Infusions:   . feeding supplement (VITAL 1.5 CAL) 1,000 mL (01/07/21 1045)  . lactated ringers 75 mL/hr at 01/07/21 0008  . meropenem (MERREM) IV 1 g (01/07/21 1028)    PRN Inpatient Medications:  acetaminophen,  diphenhydrAMINE, ketorolac, LORazepam,  methocarbamol, ondansetron **OR** ondansetron (ZOFRAN) IV, oxymetazoline   Assessment:  1. Acute recurrent pancreatitis with pseudocysts. Improved clinically with continued sequelae of ongoing inflammatory response (leukocytosis, nonsustained fever). 2. History of alcohol abuse - Maintains abstinence from alcohol in the outpatient setting.  Plan:  1. Continue daily CBC, CMP. 2. Continue analgesics as needed. Continue NET feedings for now. 3. Repeat CT tomorrow.  4. Had a discussion with the patient that he has been very patient and cooperative and will get to eat very soon, but need to make sure all parameters are improving before moving forward with po diet. I anticipate resolution in 24-48 hrs. 5. Following  Boykin Nearing. Norma Fredrickson, M.D. 01/07/2021, 3:58 PM

## 2021-01-07 NOTE — Progress Notes (Signed)
Nutrition Follow-up  DOCUMENTATION CODES:   Non-severe (moderate) malnutrition in context of chronic illness  INTERVENTION:  Continue Vital 1.5 Cal at 60 mL/hr + PROSource TF 45 mL once daily per naso-jejunal tube. Provides 2200 kcal, 108 grams of protein, 1094 mL H2O daily.  Continue minimum free water flush of 30 mL Q4hrs to maintain tube patency while patient is on IV fluids.  Still pending vitamin A and vitamin C labs from when ordered on 2/25.  NUTRITION DIAGNOSIS:   Moderate Malnutrition related to chronic illness (hx EtOH use, chronic pancreatitis) as evidenced by mild fat depletion,mild muscle depletion,moderate muscle depletion.  Ongoing.  GOAL:   Patient will meet greater than or equal to 90% of their needs  Met with TF regimen.  MONITOR:   Diet advancement,Labs,Weight trends,TF tolerance,I & O's  REASON FOR ASSESSMENT:   NPO/Clear Liquid Diet    ASSESSMENT:   35 year old male with PMHx of EtOH use, hx chronic pancreatitis who is admitted with acute pancreatitis, presumed pancreatic ascites.   2/24 unsuccessful attempt at Dobbhoff tube placement; pt seen by ENt and found to have left sided septal deviation 2/25 s/p successful placement of 8 Fr. Dobbhoff tube in right nare with tip in jejunum  Met with patient at bedside. He reports he is tolerating tube feeds well at goal rate. Noted IV fluids have decreased some. Patient reports he is still having good UOP. Patient is currently ordered for Creon. Discussed with RN and having difficulty providing per tube - Creon is high risk to clog tube. However, per GI Creon not needed at this time. Plan is to continue NPO status and tube feeding for now.  Medications reviewed and include: vitamin C 500 mg BID per tube, free water 30 mL Q4hrs, Protonix, vitamin A 10000 units daily per tube, vitamin D 50000 units every 7 days, LR at 75 mL/hr, meropenem.  Labs reviewed: CBG 95-127, Creatinine 0.54. Potassium, Phosphorus, and  Magnesium WNL. On 2/25: Iron 21, TIBC 104, Ferritin 732, Folate 15.2, Copper 96, Vitamin D 32.69, zinc 62. Vitamin B12 415 on 2/27.  I/O: 1350 mL UOP yesterday (0.8 ml/kg/hr)  No weight to trend since 2/24  Enteral Access: 8 Fr. Dobbhoff tube in right nare terminating in jejunum  TF regimen: Vital 1.5 Cal at 60 mL/hr + PROSource TF 45 mL BID  Diet Order:   Diet Order            Diet NPO time specified  Diet effective now                EDUCATION NEEDS:   No education needs have been identified at this time  Skin:  Skin Assessment: Reviewed RN Assessment  Last BM:  01/07/2021 per chart  Height:   Ht Readings from Last 1 Encounters:  12/29/20 '6\' 1"'  (1.854 m)   Weight:   Wt Readings from Last 1 Encounters:  01/03/21 73 kg   BMI:  Body mass index is 21.23 kg/m.  Estimated Nutritional Needs:   Kcal:  2100-2300  Protein:  105-115 grams  Fluid:  2.1-2.3 L/day  Jacklynn Barnacle, MS, RD, LDN Pager number available on Amion

## 2021-01-08 ENCOUNTER — Encounter: Payer: Self-pay | Admitting: Internal Medicine

## 2021-01-08 ENCOUNTER — Inpatient Hospital Stay: Payer: Medicaid Other

## 2021-01-08 DIAGNOSIS — K859 Acute pancreatitis without necrosis or infection, unspecified: Secondary | ICD-10-CM | POA: Diagnosis not present

## 2021-01-08 DIAGNOSIS — K861 Other chronic pancreatitis: Secondary | ICD-10-CM | POA: Diagnosis not present

## 2021-01-08 LAB — CBC WITH DIFFERENTIAL/PLATELET
Abs Immature Granulocytes: 0.15 10*3/uL — ABNORMAL HIGH (ref 0.00–0.07)
Basophils Absolute: 0 10*3/uL (ref 0.0–0.1)
Basophils Relative: 0 %
Eosinophils Absolute: 0.6 10*3/uL — ABNORMAL HIGH (ref 0.0–0.5)
Eosinophils Relative: 4 %
HCT: 28.4 % — ABNORMAL LOW (ref 39.0–52.0)
Hemoglobin: 9.9 g/dL — ABNORMAL LOW (ref 13.0–17.0)
Immature Granulocytes: 1 %
Lymphocytes Relative: 11 %
Lymphs Abs: 1.6 10*3/uL (ref 0.7–4.0)
MCH: 31.9 pg (ref 26.0–34.0)
MCHC: 34.9 g/dL (ref 30.0–36.0)
MCV: 91.6 fL (ref 80.0–100.0)
Monocytes Absolute: 1.3 10*3/uL — ABNORMAL HIGH (ref 0.1–1.0)
Monocytes Relative: 9 %
Neutro Abs: 10.4 10*3/uL — ABNORMAL HIGH (ref 1.7–7.7)
Neutrophils Relative %: 75 %
Platelets: 336 10*3/uL (ref 150–400)
RBC: 3.1 MIL/uL — ABNORMAL LOW (ref 4.22–5.81)
RDW: 13 % (ref 11.5–15.5)
WBC: 14 10*3/uL — ABNORMAL HIGH (ref 4.0–10.5)
nRBC: 0 % (ref 0.0–0.2)

## 2021-01-08 LAB — GLUCOSE, CAPILLARY
Glucose-Capillary: 104 mg/dL — ABNORMAL HIGH (ref 70–99)
Glucose-Capillary: 106 mg/dL — ABNORMAL HIGH (ref 70–99)
Glucose-Capillary: 112 mg/dL — ABNORMAL HIGH (ref 70–99)
Glucose-Capillary: 118 mg/dL — ABNORMAL HIGH (ref 70–99)
Glucose-Capillary: 119 mg/dL — ABNORMAL HIGH (ref 70–99)
Glucose-Capillary: 136 mg/dL — ABNORMAL HIGH (ref 70–99)

## 2021-01-08 LAB — HEPATIC FUNCTION PANEL
ALT: 23 U/L (ref 0–44)
AST: 31 U/L (ref 15–41)
Albumin: 2 g/dL — ABNORMAL LOW (ref 3.5–5.0)
Alkaline Phosphatase: 59 U/L (ref 38–126)
Bilirubin, Direct: 0.1 mg/dL (ref 0.0–0.2)
Indirect Bilirubin: 0.5 mg/dL (ref 0.3–0.9)
Total Bilirubin: 0.6 mg/dL (ref 0.3–1.2)
Total Protein: 4.9 g/dL — ABNORMAL LOW (ref 6.5–8.1)

## 2021-01-08 LAB — BASIC METABOLIC PANEL
Anion gap: 7 (ref 5–15)
BUN: 9 mg/dL (ref 6–20)
CO2: 28 mmol/L (ref 22–32)
Calcium: 8 mg/dL — ABNORMAL LOW (ref 8.9–10.3)
Chloride: 100 mmol/L (ref 98–111)
Creatinine, Ser: 0.52 mg/dL — ABNORMAL LOW (ref 0.61–1.24)
GFR, Estimated: >60 mL/min (ref >60)
Glucose, Bld: 117 mg/dL — ABNORMAL HIGH (ref 70–99)
Potassium: 4.5 mmol/L (ref 3.5–5.1)
Sodium: 135 mmol/L (ref 135–145)

## 2021-01-08 LAB — PHOSPHORUS: Phosphorus: 4.9 mg/dL — ABNORMAL HIGH (ref 2.5–4.6)

## 2021-01-08 LAB — MAGNESIUM: Magnesium: 1.9 mg/dL (ref 1.7–2.4)

## 2021-01-08 IMAGING — CT CT ABDOMEN W/ CM
2 of 4 series · 15 of 46 positions shown, 17 images · IV contrast (APPLIED)
Comparison: [DATE]

CLINICAL DATA: Follow-up acute on chronic pancreatitis.

EXAM:
CT ABDOMEN WITH CONTRAST
TECHNIQUE: Multidetector CT imaging of the abdomen was performed using the
standard protocol following bolus administration of intravenous
contrast.
CONTRAST:  100mL OMNIPAQUE IOHEXOL 300 MG/ML  SOLN

[Series 2: routine abd/pel with · axial · 0.69mm/px · z∈[-662,-402]mm · 12 of 58 slices shown, 14 images]
[im 3/58  soft-tissue]
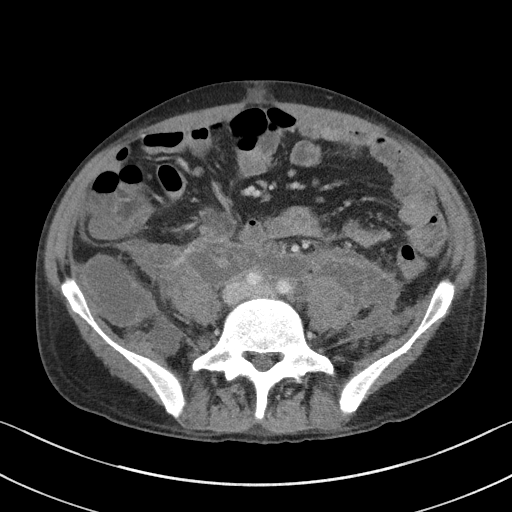
[im 3/58  bone]
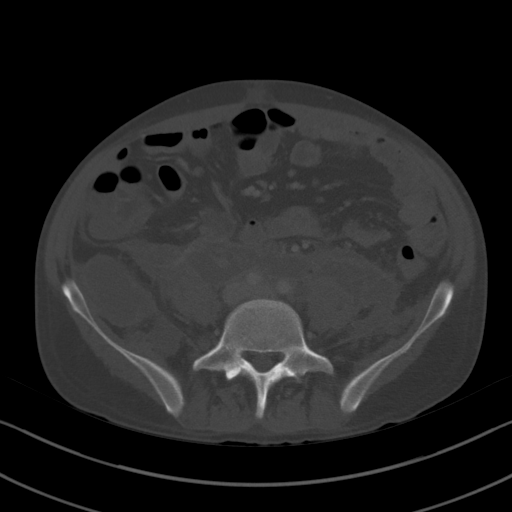
[im 8/58  soft-tissue]
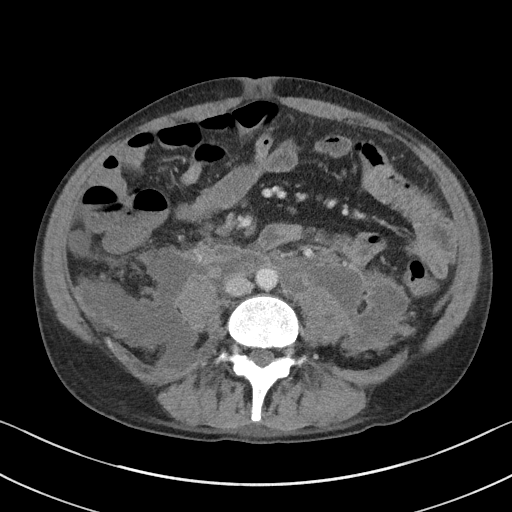
[im 13/58  soft-tissue]
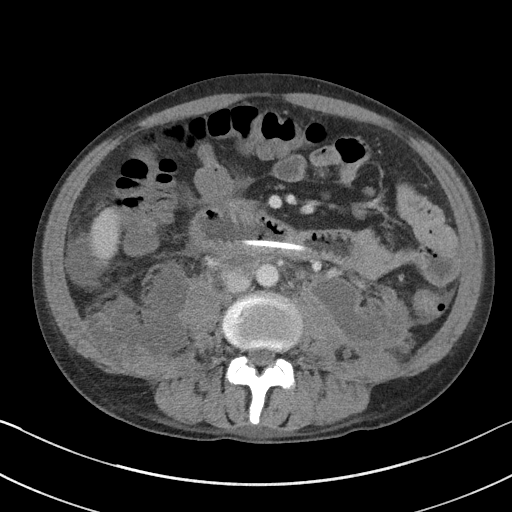
[im 19/58  soft-tissue]
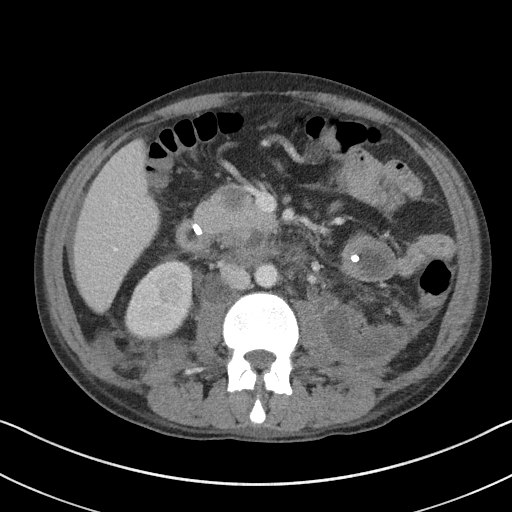
[im 21/58  soft-tissue]
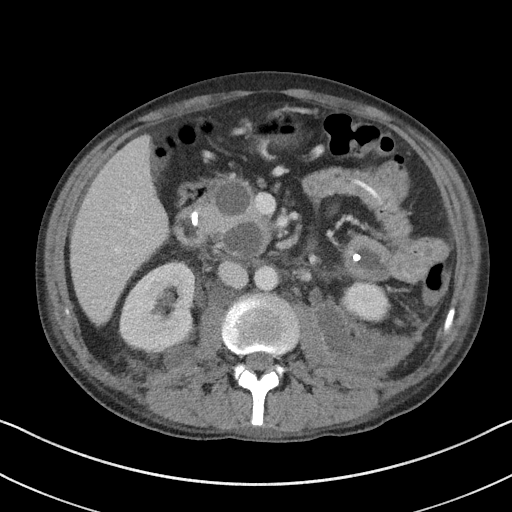
[im 26/58  soft-tissue]
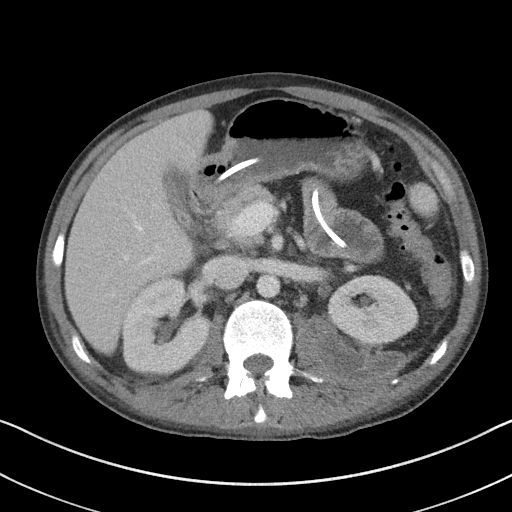
[im 32/58  soft-tissue]
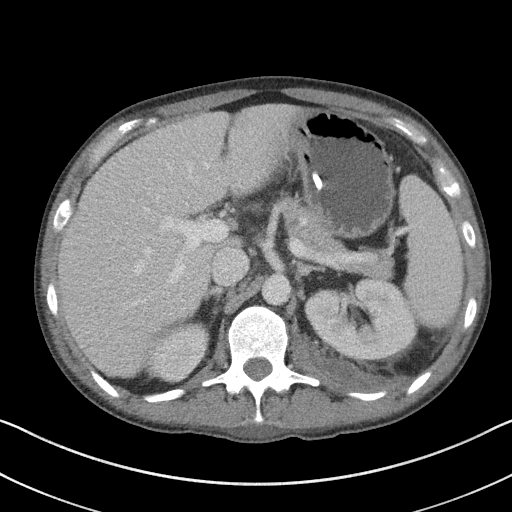
[im 37/58  soft-tissue]
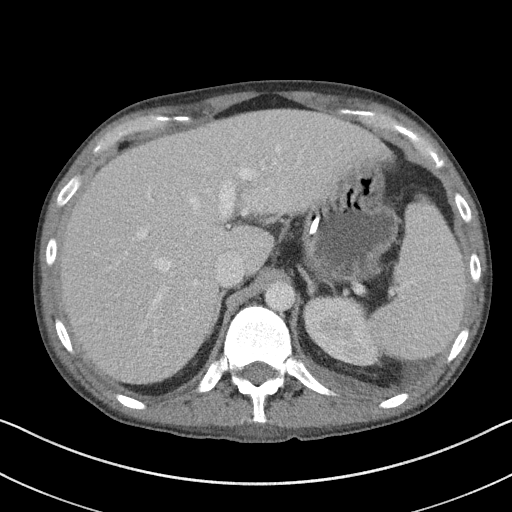
[im 39/58  soft-tissue]
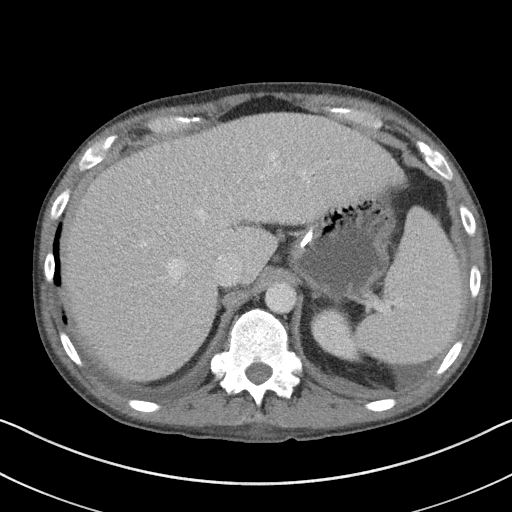
[im 39/58  bone]
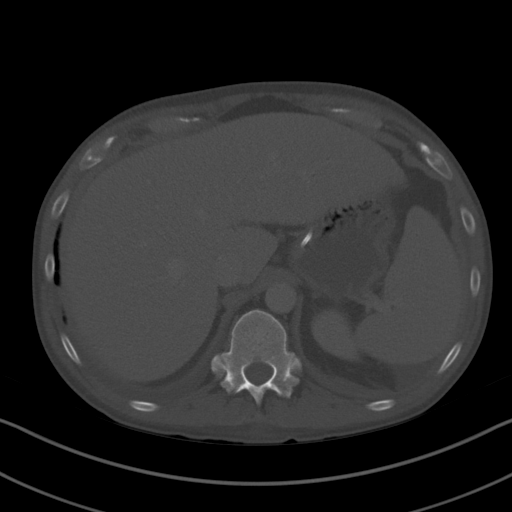
[im 45/58  soft-tissue]
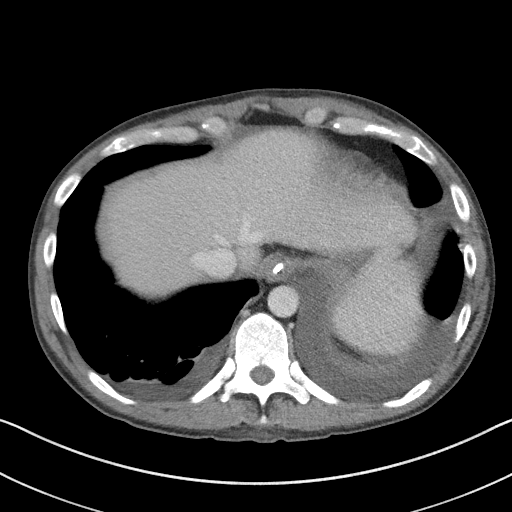
[im 50/58  soft-tissue]
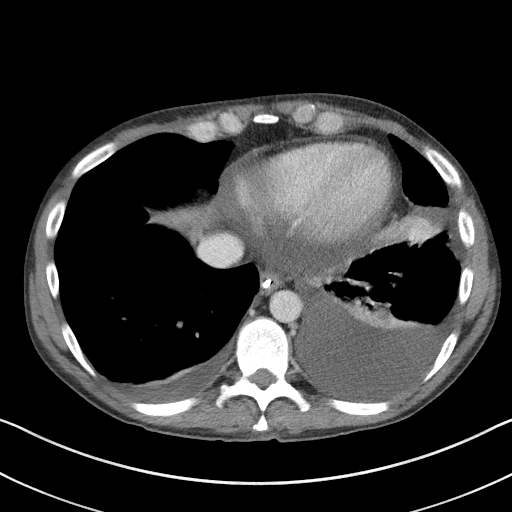
[im 55/58  soft-tissue]
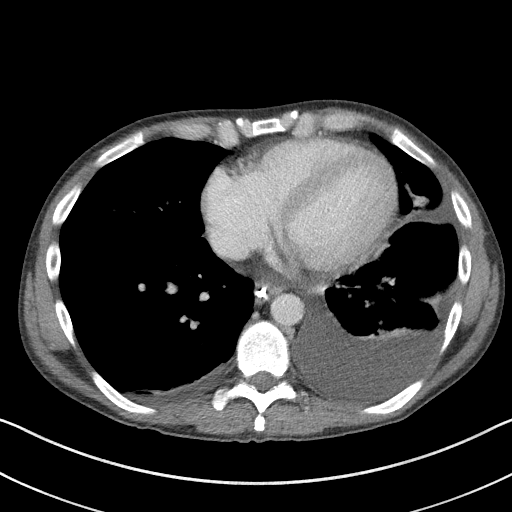

[Series 5: coronal st · coronal · 0.57mm/px · 3 of 87 slices shown]
[im 29/87  soft-tissue]
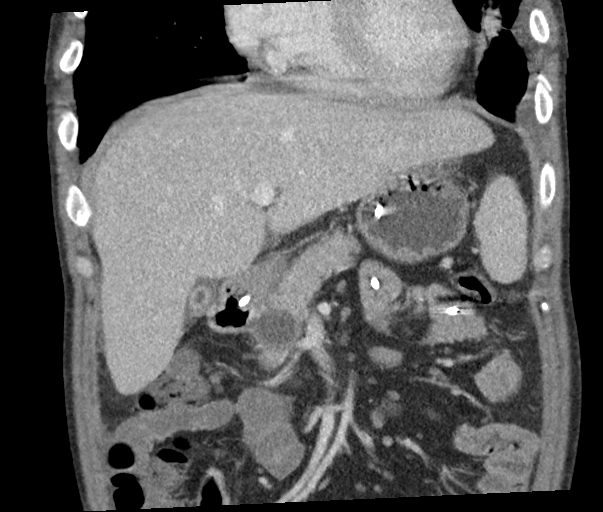
[im 39/87  soft-tissue]
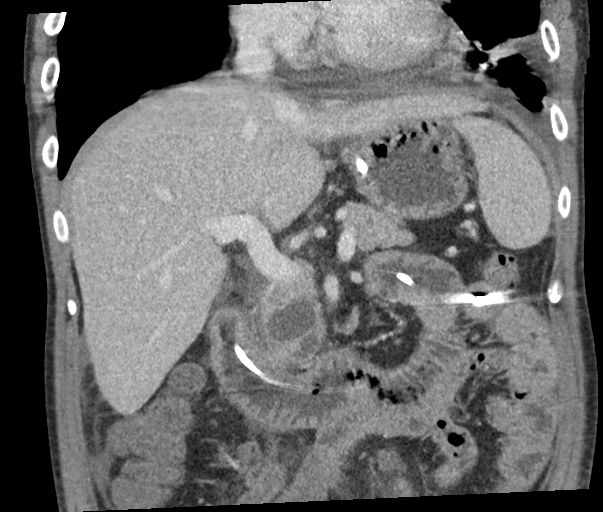
[im 48/87  soft-tissue]
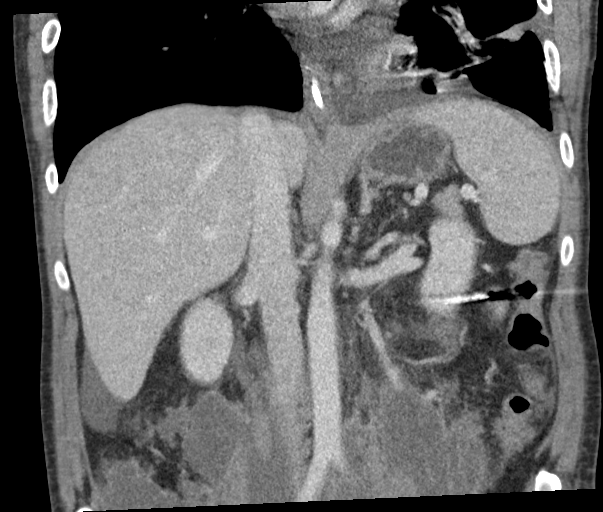

[15 of 46 positions shown; findings below may reference images not displayed]

FINDINGS: Lower chest: Moderate left pleural effusion has increased in volume
from previous exam. Smaller right pleural effusion is also increased
from previous exam. Subsegmental atelectasis noted in the left lower
lobe and lingula.

Hepatobiliary: No suspicious liver abnormality. Gallbladder appears
collapsed and there is diffuse gallbladder wall edema. No biliary
ductal dilatation.

Pancreas: There are 2 cystic areas within the head of pancreas. The
anterior cystic area measures 2.2 cm, image 38/2. This is compared
with 2.5 cm previously. The posterior cystic area measures 2.5 cm.
Previously 2.3 cm. No main duct dilatation. Subjectively improved
appearance of peripancreatic edema.

Spleen: Normal in size without focal abnormality.

Adrenals/Urinary Tract: Normal appearance of the adrenal glands.
Bilateral pelvocaliectasis appears similar to previous study. No
mass or renal calculi.

Stomach/Bowel: A feeding tube tip is beyond the ligament of Treitz.
Mild increase caliber of the proximal jejunum. Multiple air-filled
loops of small bowel are noted which likely reflect underlying
ileus.

Vascular/Lymphatic: No significant vascular findings are present. No
enlarged abdominal or pelvic lymph nodes. Multiple small upper
abdominal lymph nodes are likely reactive.

Other: There has been interval decrease in free fluid within the
abdomen. There are maturing fluid collections mild peripheral rim
enhancement. Within the left retroperitoneum there is a fluid
collection along the left psoas muscle which measures 11.9 by
cm, image 58/5. Similarly, in the right retroperitoneum there is a
maturing fluid collection along the right psoas muscle measures
x 5.5 cm, image 61/5. Maturing fluid collections are also identified
along the midline of the retroperitoneum ventral to aorta and IVC,
image 58/2. A small amount of free fluid is noted posterior to the
spleen.

Musculoskeletal: No acute or significant osseous findings.
IMPRESSION: 1. There are maturing fluid collections identified within the
retroperitoneum bilaterally. Compared with the previous exam the
retroperitoneal fluid is more organized with mild peripheral rim
enhancement.
2. Similar appearance of cystic areas within the head of pancreas
compatible with pseudo cysts.
3. Increase in volume of bilateral pleural effusions.
4. Decrease in free fluid within the upper abdomen.

## 2021-01-08 MED ORDER — IOHEXOL 300 MG/ML  SOLN
100.0000 mL | Freq: Once | INTRAMUSCULAR | Status: AC | PRN
  Administered 2021-01-08: 100 mL via INTRAVENOUS

## 2021-01-08 NOTE — Plan of Care (Signed)

## 2021-01-08 NOTE — Progress Notes (Signed)
PROGRESS NOTE    Shane SergeantLarry Ressel Sr.  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 He describes the pain as bandlike across his abdomen and radiating to the back associated with nausea but no vomiting. He hashad multiple episodes of pancreatitis related to alcohol use in the past but states that he hasabstain from further alcohol use since his last hospitalization about a year ago. He was seen in the emergency room about 5 days ago for abdominal pain and was diagnosed with mild pancreatitis and discharged home with a prescription for oxycodone. He was told to be on a liquid diet and to return to the emergency room if his symptoms worsened.Patient states that he took  2-3  tablets of the oxycodone 10 mg andthe next day developed a generalized erythematous macular rash which is nonpruritic. He stopped taking the oxycodone but the rash has persisted.  2/20- lipase elevated 2106, covid negative. Reports rash better but still persistent. 2/22-low grade fever this am. Tmax 100.6 2/23: Improved pain control.  Continues to spike low-grade temperatures.  100.5 noted on 2/22 at 1956.  Patient still well.  Endorses hunger.  Remains NPO.  Diagnostic paracentesis cannot be performed due to lack of safe window. 2/25: After initial unsuccessful IR guided Dobbhoff tube placement I contacted ENT who scoped the patient's nose at bedside.  Found the right nare to be patent without deviation.  Interventional radiology contacted again and patient underwent successful placement of postpyloric feeding tube.  RD contacted for tube feeding initiation. 2/26:  NG tube in place and tube feeds initiated.  Slowly escalating up to goal.  Patient remains hemodynamically stable.  No fevers reported over past 24 hours.  White count remains elevated slowly trending down 2/27: Fever free last 48 hours.  White count trending down.  Patient is worsening fatigue 2/28: 100.8 fever noted this morning.  Patient feels symptomatically okay.  Still endorsing fatigue.  White count slowly trending down.  Discussed with Dr. Norma Fredricksonoledo.  No need for pancreatic enzyme replacement at the moment. 3/1: Repeat CAT scan demonstrating more organized fluid collections. White count slightly increased to fourteen. No fevers over interval. Patient feels well overall.    Assessment & Plan:   Principal Problem:   Acute on chronic pancreatitis [facility](HCC) Active Problems:   Nicotine dependence   Malnutrition of moderate degree  Acute on chronic pancreatitis History of chronic alcoholic pancreatitis Not actively drinking, last drink 1 year ago Lipase initially 2106, now 80 Repeat CAT scan raised the possibility of pancreatic duct disruption versus rupture of peripancreatic necrotic area Diagnostic paracentesis ordered however could not be performed due to lack of safe window Started on meropenem per GI recommendations 2/27: Pyloric DHT in place.  Patient tolerating tube feeds.  White count trending down 2/28: Fever 100.8.  WBC slowly trending down 3/1: WBC elevated. No fever Plan: Continue tube feeds Continue meropenem Stop IV fluids, encourage incentive spirometry Pain control as needed Serial abdominal examinations Daily labs Monitor fever curve Discussed with RD and GI.  No need for Creon at this time GI to follow-up on results of repeat CAT scan  Nicotine dependence Counseling offered during this admission Pt declined nicotine patch  History of dural venous sinus thrombosis Chronic anticoagulation Continue Xarelto  Skin rash Improving Occurred after narcotic  administration Plan: As needed Benadryl Avoid narcotics  DVT prophylaxis: Xarelto Code Status: Full Family Communication: Wife at bedside 2/27 Disposition Plan: Status is: Inpatient  Remains inpatient appropriate because:Inpatient level of care appropriate due to severity of illness   Dispo: The patient is from: Home              Anticipated d/c is to: Home              Patient currently is not medically stable to d/c.   Difficult to place patient No   Acute on chronic pancreatitis. DHT in place, tube feeds running. GI following. Clinical progression has been slow. Disposition plan pending.   Level of care: Med-Surg  Consultants:   GI  Procedures:   None  Antimicrobials:   Meropenem   Subjective: Patient seen and examined.  Continues to endorse fatigue.  No other complaints.  Objective: Vitals:   01/07/21 2136 01/08/21 0003 01/08/21 0350 01/08/21 0800  BP: 134/73 130/80 111/71 127/79  Pulse: 99 97 96 90  Resp: 18 18 18 15   Temp: 100 F (37.8 C) 98.8 F (37.1 C) (!) 97.5 F (36.4 C) 98.6 F (37 C)  TempSrc:      SpO2: 93% 96% 92% 96%  Weight:      Height:        Intake/Output Summary (Last 24 hours) at 01/08/2021 1322 Last data filed at 01/08/2021 0355 Gross per 24 hour  Intake 2105 ml  Output 1250 ml  Net 855 ml   Filed Weights   12/29/20 0732 01/03/21 1613  Weight: 63.5 kg 73 kg    Examination:  General exam: Appears calm and comfortable  Respiratory system: Clear to auscultation. Respiratory effort normal. Cardiovascular system: S1 & S2 heard, RRR. No JVD, murmurs, rubs, gallops or clicks. No pedal edema. Gastrointestinal system: Nondistended, tender to palpation left upper quadrant, normal bowel sounds Central nervous system: Alert and oriented. No focal neurological deficits. Extremities: Symmetric 5 x 5 power. Skin: No rashes, lesions or ulcers Psychiatry: Judgement and insight appear normal. Mood & affect appropriate.     Data  Reviewed: I have personally reviewed following labs and imaging studies  CBC: Recent Labs  Lab 01/04/21 0427 01/05/21 0333 01/06/21 0541 01/07/21 0509 01/08/21 0418  WBC 17.8* 17.1* 13.7* 13.5* 14.0*  NEUTROABS 13.7* 13.3* 10.8* 10.1* 10.4*  HGB 10.3* 9.7* 9.2* 9.9* 9.9*  HCT 30.4* 28.5* 26.6* 28.8* 28.4*  MCV 92.1 91.9 91.4 92.6 91.6  PLT 205 220 271 295 336   Basic Metabolic Panel: Recent Labs  Lab 01/04/21 0427 01/05/21 0333 01/06/21 0541 01/07/21 0509 01/08/21 0418  NA 135 136 136 138 135  K 3.4* 3.1* 3.5 4.3 4.5  CL 103 103 103 102 100  CO2 21* 24 26 28 28   GLUCOSE 95 117* 130* 113* 117*  BUN 7 8 7 9 9   CREATININE 0.67 0.63 0.56* 0.54* 0.52*  CALCIUM 7.7* 7.7* 7.6* 8.0* 8.0*  MG  --  1.6* 1.7 2.0 1.9  PHOS  --  3.2 2.7 4.2 4.9*   GFR: Estimated Creatinine Clearance: 134.3 mL/min (A) (by C-G formula based on SCr of 0.52 mg/dL (L)). Liver Function Tests: Recent Labs  Lab 01/04/21 0427 01/05/21 0333 01/06/21 0541 01/07/21 0509 01/08/21 0418  AST 23 37 38 28 31  ALT 14 19 23 22 23   ALKPHOS 61 68 59 59 59  BILITOT  1.3* 0.8 0.5 0.4 0.6  PROT 4.9* 4.9* 4.6* 4.9* 4.9*  ALBUMIN 2.1* 2.1* 1.9* 2.0* 2.0*   Recent Labs  Lab 01/02/21 0357  LIPASE 79*   No results for input(s): AMMONIA in the last 168 hours. Coagulation Profile: No results for input(s): INR, PROTIME in the last 168 hours. Cardiac Enzymes: No results for input(s): CKTOTAL, CKMB, CKMBINDEX, TROPONINI in the last 168 hours. BNP (last 3 results) No results for input(s): PROBNP in the last 8760 hours. HbA1C: No results for input(s): HGBA1C in the last 72 hours. CBG: Recent Labs  Lab 01/07/21 1629 01/07/21 2134 01/08/21 0004 01/08/21 0351 01/08/21 1136  GLUCAP 114* 114* 106* 119* 136*   Lipid Profile: No results for input(s): CHOL, HDL, LDLCALC, TRIG, CHOLHDL, LDLDIRECT in the last 72 hours. Thyroid Function Tests: No results for input(s): TSH, T4TOTAL, FREET4, T3FREE, THYROIDAB in the  last 72 hours. Anemia Panel: Recent Labs    01/06/21 1544  VITAMINB12 415   Sepsis Labs: No results for input(s): PROCALCITON, LATICACIDVEN in the last 168 hours.  Recent Results (from the past 240 hour(s))  Urine Culture     Status: Abnormal   Collection Time: 01/01/21  9:07 AM   Specimen: Urine, Clean Catch  Result Value Ref Range Status   Specimen Description   Final    URINE, CLEAN CATCH Performed at [facility], [address], [place], [place] [address]    Special Requests   Final    NONE Performed at [facility], [address], [place], [place] [address]    Culture (A)  Final    <10,000 COLONIES/mL INSIGNIFICANT GROWTH Performed at [facility], [address], [place], [place] [address]    Report Status 01/02/2021 FINAL  Final  CULTURE, BLOOD (ROUTINE X 2) w Reflex to ID Panel     Status: None   Collection Time: 01/01/21  9:47 AM   Specimen: BLOOD  Result Value Ref Range Status   Specimen Description BLOOD LEFT ARM  Final   Special Requests   Final    BOTTLES DRAWN AEROBIC AND ANAEROBIC Blood Culture adequate volume   Culture   Final    NO GROWTH 5 DAYS Performed at [facility], [address] [place], [place] [address]    Report Status 01/06/2021 FINAL  Final  CULTURE, BLOOD (ROUTINE X 2) w Reflex to ID Panel     Status: None   Collection Time: 01/01/21  9:48 AM   Specimen: BLOOD  Result Value Ref Range Status   Specimen Description BLOOD  LEFT HAND  Final   Special Requests   Final    BOTTLES DRAWN AEROBIC AND ANAEROBIC Blood Culture adequate volume   Culture   Final    NO GROWTH 5 DAYS Performed at [facility], [address] [place], [place] [address]    Report Status 01/06/2021 FINAL  Final         Radiology Studies: CT ABDOMEN W CONTRAST  Result Date: 01/08/2021 CLINICAL DATA:  Follow-up acute on chronic pancreatitis. EXAM: CT ABDOMEN WITH CONTRAST TECHNIQUE: Multidetector CT imaging of the  abdomen was performed using the standard protocol following bolus administration of intravenous contrast. CONTRAST:  OMNIPAQUE IOHEXOL 300 MG/ML  SOLN COMPARISON:  01/01/2021 FINDINGS: Lower chest: Moderate left pleural effusion has increased in volume from previous exam. Smaller right pleural effusion is also increased from previous exam. Subsegmental atelectasis noted in the left lower lobe and lingula. Hepatobiliary: No suspicious liver abnormality.  Gallbladder appears collapsed and there is diffuse gallbladder wall edema. No biliary ductal dilatation. Pancreas: There are 2 cystic areas within the head of pancreas. The anterior cystic area measures 2.2 cm, image 38/2. This is compared with 2.5 cm previously. The posterior cystic area measures 2.5 cm. Previously 2.3 cm. No main duct dilatation. Subjectively improved appearance of peripancreatic edema. Spleen: Normal in size without focal abnormality. Adrenals/Urinary Tract: Normal appearance of the adrenal glands. Bilateral pelvocaliectasis appears similar to previous study. No mass or renal calculi. Stomach/Bowel: A feeding tube tip is beyond the ligament of Treitz. Mild increase caliber of the proximal jejunum. Multiple air-filled loops of small bowel are noted which likely reflect underlying ileus. Vascular/Lymphatic: No significant vascular findings are present. No enlarged abdominal or pelvic lymph nodes. Multiple small upper abdominal lymph nodes are likely reactive. Other: There has been interval decrease in free fluid within the abdomen. There are maturing fluid collections mild peripheral rim enhancement. Within the left retroperitoneum there is a fluid collection along the left psoas muscle which measures 11.9 by 4.8 cm, image 58/5. Similarly, in the right retroperitoneum there is a maturing fluid collection along the right psoas muscle measures 7.2 x 5.5 cm, image 61/5. Maturing fluid collections are also identified along the midline of the  retroperitoneum ventral to aorta and IVC, image 58/2. A small amount of free fluid is noted posterior to the spleen. Musculoskeletal: No acute or significant osseous findings. IMPRESSION: 1. There are maturing fluid collections identified within the retroperitoneum bilaterally. Compared with the previous exam the retroperitoneal fluid is more organized with mild peripheral rim enhancement. 2. Similar appearance of cystic areas within the head of pancreas compatible with pseudo cysts. 3. Increase in volume of bilateral pleural effusions. 4. Decrease in free fluid within the upper abdomen. Electronically Signed   By: Signa Kell M.D.   On: 01/08/2021 11:24        Scheduled Meds: . ascorbic acid  500 mg Per Tube BID  . feeding supplement (PROSource TF)  45 mL Per Tube Daily  . free water  30 mL Per Tube Q4H  . metoprolol tartrate  50 mg Per Tube BID  . pantoprazole (PROTONIX) IV  40 mg Intravenous Q24H  . rivaroxaban  20 mg Per Tube Q supper  . vitamin A  10,000 Units Per Tube Daily  . [START ON 01/11/2021] Vitamin D (Ergocalciferol)  50,000 Units Per Tube Q7 days  . zinc sulfate  220 mg Per Tube Daily   Continuous Infusions: . feeding supplement (VITAL 1.5 CAL) 1,000 mL (01/08/21 0533)  . meropenem (MERREM) IV 1 g (01/08/21 0957)     LOS: 10 days    Time spent: 15 minutes    Tresa Moore, MD [facility] Pager [phone]  If 7PM-7AM, please contact night-coverage 01/08/2021, 1:22 PM

## 2021-01-09 ENCOUNTER — Encounter: Payer: Self-pay | Admitting: Internal Medicine

## 2021-01-09 DIAGNOSIS — J90 Pleural effusion, not elsewhere classified: Secondary | ICD-10-CM

## 2021-01-09 LAB — GLUCOSE, CAPILLARY
Glucose-Capillary: 128 mg/dL — ABNORMAL HIGH (ref 70–99)
Glucose-Capillary: 128 mg/dL — ABNORMAL HIGH (ref 70–99)
Glucose-Capillary: 118 mg/dL — ABNORMAL HIGH (ref 70–99)
Glucose-Capillary: 114 mg/dL — ABNORMAL HIGH (ref 70–99)
Glucose-Capillary: 140 mg/dL — ABNORMAL HIGH (ref 70–99)
Glucose-Capillary: 98 mg/dL (ref 70–99)

## 2021-01-09 LAB — COMPREHENSIVE METABOLIC PANEL
ALT: 29 U/L (ref 0–44)
AST: 34 U/L (ref 15–41)
Albumin: 2.2 g/dL — ABNORMAL LOW (ref 3.5–5.0)
Alkaline Phosphatase: 65 U/L (ref 38–126)
Anion gap: 10 (ref 5–15)
BUN: 11 mg/dL (ref 6–20)
CO2: 26 mmol/L (ref 22–32)
Calcium: 8.2 mg/dL — ABNORMAL LOW (ref 8.9–10.3)
Chloride: 98 mmol/L (ref 98–111)
Creatinine, Ser: 0.6 mg/dL — ABNORMAL LOW (ref 0.61–1.24)
GFR, Estimated: >60 mL/min (ref >60)
Glucose, Bld: 125 mg/dL — ABNORMAL HIGH (ref 70–99)
Potassium: 4.3 mmol/L (ref 3.5–5.1)
Sodium: 134 mmol/L — ABNORMAL LOW (ref 135–145)
Total Bilirubin: 0.5 mg/dL (ref 0.3–1.2)
Total Protein: 5.4 g/dL — ABNORMAL LOW (ref 6.5–8.1)

## 2021-01-09 LAB — CBC WITH DIFFERENTIAL/PLATELET
Abs Immature Granulocytes: 0.15 10*3/uL — ABNORMAL HIGH (ref 0.00–0.07)
Basophils Absolute: 0.1 10*3/uL (ref 0.0–0.1)
Basophils Relative: 0 %
Eosinophils Absolute: 2.4 10*3/uL — ABNORMAL HIGH (ref 0.0–0.5)
Eosinophils Relative: 15 %
HCT: 30.7 % — ABNORMAL LOW (ref 39.0–52.0)
Hemoglobin: 10.3 g/dL — ABNORMAL LOW (ref 13.0–17.0)
Immature Granulocytes: 1 %
Lymphocytes Relative: 8 %
Lymphs Abs: 1.3 10*3/uL (ref 0.7–4.0)
MCH: 30.7 pg (ref 26.0–34.0)
MCHC: 33.6 g/dL (ref 30.0–36.0)
MCV: 91.6 fL (ref 80.0–100.0)
Monocytes Absolute: 1 10*3/uL (ref 0.1–1.0)
Monocytes Relative: 6 %
Neutro Abs: 11.2 10*3/uL — ABNORMAL HIGH (ref 1.7–7.7)
Neutrophils Relative %: 70 %
Platelets: 396 10*3/uL (ref 150–400)
RBC: 3.35 MIL/uL — ABNORMAL LOW (ref 4.22–5.81)
RDW: 13 % (ref 11.5–15.5)
Smear Review: NORMAL
WBC: 16.1 10*3/uL — ABNORMAL HIGH (ref 4.0–10.5)
nRBC: 0 % (ref 0.0–0.2)

## 2021-01-09 MED ORDER — PANCRELIPASE (LIP-PROT-AMYL) 12000-38000 UNITS PO CPEP
36000.0000 [IU] | ORAL_CAPSULE | Freq: Three times a day (TID) | ORAL | Status: DC
  Administered 2021-01-09 – 2021-01-10 (×2): 36000 [IU] via ORAL
  Filled 2021-01-09 (×7): qty 3

## 2021-01-09 NOTE — Progress Notes (Signed)
Alert and oriented x 4. Able to  Make needs known. C/o back pain earlier in shift. Tylenol given with relief. Continues on Ng tube feeding and medication. Tolerated well. Tolerated IV abt as well. Call bell within reach, care clustered. Continue plan of care.

## 2021-01-09 NOTE — Progress Notes (Addendum)
Progress Note    Goro Wenrick  GPQ:[id number] DOB: Jul 26, 1986  DOA: 12/29/2020 PCP: [name], MD      Brief Narrative:    Medical records reviewed and are as summarized below:  [name] is a 35 y.o. male       Assessment/Plan:   Principal Problem:   Acute on chronic pancreatitis (HCC) Active Problems:   Nicotine dependence   Malnutrition of moderate degree   Bilateral pleural effusion   Nutrition Problem: Moderate Malnutrition Etiology: chronic illness (hx EtOH use, chronic pancreatitis)  Signs/Symptoms: mild fat depletion,mild muscle depletion,moderate muscle depletion   Body mass index is 21.23 kg/m.   Acute on chronic pancreatitis, recent fever: Continue enteral nutrition for now.  Continue empiric IV antibiotics.  Repeat CT abdomen and pelvis on 01/08/2021 showed mature fluid collections within the retroperitoneum bilaterally, pancreatic pseudocysts.  Awaiting further recommendation from gastroenterologist.  Bilateral pleural effusion(left greater than right): This is likely from acute pancreatitis.  Ordered ultrasound-guided left-sided thoracentesis  History of dural venous sinus thrombosis: Continue Xarelto  Skin rash on the trunk after taking oxycodone: Resolved  Tobacco use disorder: Counseled to quit smoking cigarettes.   Diet Order            Diet NPO time specified Except for: Sips with Meds, Other (See Comments)  Diet effective midnight                    Consultants:  Gastroenterologist  Procedures:  Placement of Dobbhoff tube (with tip in jejunum) on 01/04/2021 under fluoroscopy    Medications:   . ascorbic acid  500 mg Per Tube BID  . feeding supplement (PROSource TF)  45 mL Per Tube Daily  . free water  30 mL Per Tube Q4H  . metoprolol tartrate  50 mg Per Tube BID  . pantoprazole (PROTONIX) IV  40 mg Intravenous Q24H  . rivaroxaban  20 mg Per Tube Q supper  . vitamin A  10,000 Units Per Tube Daily  .  [START ON 01/11/2021] Vitamin D (Ergocalciferol)  50,000 Units Per Tube Q7 days  . zinc sulfate  220 mg Per Tube Daily   Continuous Infusions: . feeding supplement (VITAL 1.5 CAL) 1,000 mL (01/08/21 0533)  . meropenem (MERREM) IV 1 g (01/09/21 0909)     Anti-infectives (From admission, onward)   Start     Dose/Rate Route Frequency Ordered Stop   01/01/21 1800  meropenem (MERREM) 1 g in sodium chloride 0.9 % 100 mL IVPB        1 g 200 mL/hr over 30 Minutes Intravenous Every 8 hours 01/01/21 1659     01/01/21 1730  meropenem (MERREM) 500 mg in sodium chloride 0.9 % 100 mL IVPB  Status:  Discontinued        500 mg 200 mL/hr over 30 Minutes Intravenous Every 8 hours 01/01/21 1638 01/01/21 1658             Family Communication/Anticipated D/C date and plan/Code Status   DVT prophylaxis:  rivaroxaban (XARELTO) tablet 20 mg     Code Status: Full Code  Family Communication: Discussed with fiance at the bedside Disposition Plan:    Status is: Inpatient  Remains inpatient appropriate because:Inpatient level of care appropriate due to severity of illness   Dispo: The patient is from: Home              Anticipated d/c is to: Home  Patient currently is not medically stable to d/c.   Difficult to place patient No           Subjective:   Interval events noted.  No abdominal pain, nausea or vomiting.  Objective:    Vitals:   01/08/21 2330 01/09/21 0408 01/09/21 0727 01/09/21 1120  BP: 115/75 109/65 108/61 (!) 102/56  Pulse: 100 91 (!) 105 80  Resp: 17 16 16 18   Temp: 98.4 F (36.9 C) 97.6 F (36.4 C) 98.7 F (37.1 C) 97.9 F (36.6 C)  TempSrc: Oral  Oral Oral  SpO2: 94% 96% 96% 97%  Weight:      Height:       No data found.   Intake/Output Summary (Last 24 hours) at 01/09/2021 1434 Last data filed at 01/09/2021 1400 Gross per 24 hour  Intake 700 ml  Output 500 ml  Net 200 ml   Filed Weights   12/29/20 0732 01/03/21 1613  Weight: 63.5  kg 73 kg    Exam:  GEN: NAD SKIN: No rash EYES: EOMI, NG tube in place ENT: MMM CV: RRR PULM: CTA B ABD: soft, ND, NT, +BS CNS: AAO x 3, non focal EXT: No edema or tenderness    Data Reviewed:   I have personally reviewed following labs and imaging studies:  Labs: Labs show the following:   Basic Metabolic Panel: Recent Labs  Lab 01/05/21 0333 01/06/21 0541 01/07/21 0509 01/08/21 0418 01/09/21 0416  NA 136 136 138 135 134*  K 3.1* 3.5 4.3 4.5 4.3  CL 103 103 102 100 98  CO2 24 26 28 28 26   GLUCOSE 117* 130* 113* 117* 125*  BUN 8 7 9 9 11   CREATININE 0.63 0.56* 0.54* 0.52* 0.60*  CALCIUM 7.7* 7.6* 8.0* 8.0* 8.2*  MG 1.6* 1.7 2.0 1.9  --   PHOS 3.2 2.7 4.2 4.9*  --    GFR Estimated Creatinine Clearance: 134.3 mL/min (A) (by C-G formula based on SCr of 0.6 mg/dL (L)). Liver Function Tests: Recent Labs  Lab 01/05/21 0333 01/06/21 0541 01/07/21 0509 01/08/21 0418 01/09/21 0416  AST 37 38 28 31 34  ALT 19 23 22 23 29   ALKPHOS 68 59 59 59 65  BILITOT 0.8 0.5 0.4 0.6 0.5  PROT 4.9* 4.6* 4.9* 4.9* 5.4*  ALBUMIN 2.1* 1.9* 2.0* 2.0* 2.2*   No results for input(s): LIPASE, AMYLASE in the last 168 hours. No results for input(s): AMMONIA in the last 168 hours. Coagulation profile No results for input(s): INR, PROTIME in the last 168 hours.  CBC: Recent Labs  Lab 01/05/21 0333 01/06/21 0541 01/07/21 0509 01/08/21 0418 01/09/21 0416  WBC 17.1* 13.7* 13.5* 14.0* 16.1*  NEUTROABS 13.3* 10.8* 10.1* 10.4* 11.2*  HGB 9.7* 9.2* 9.9* 9.9* 10.3*  HCT 28.5* 26.6* 28.8* 28.4* 30.7*  MCV 91.9 91.4 92.6 91.6 91.6  PLT 220 271 295 336 396   Cardiac Enzymes: No results for input(s): CKTOTAL, CKMB, CKMBINDEX, TROPONINI in the last 168 hours. BNP (last 3 results) No results for input(s): PROBNP in the last 8760 hours. CBG: Recent Labs  Lab 01/08/21 2115 01/08/21 2328 01/09/21 0406 01/09/21 0728 01/09/21 1120  GLUCAP 118* 112* 128* 128* 118*   D-Dimer: No  results for input(s): DDIMER in the last 72 hours. Hgb A1c: No results for input(s): HGBA1C in the last 72 hours. Lipid Profile: No results for input(s): CHOL, HDL, LDLCALC, TRIG, CHOLHDL, LDLDIRECT in the last 72 hours. Thyroid function studies: No results for input(s): TSH,  T4TOTAL, T3FREE, THYROIDAB in the last 72 hours.  Invalid input(s): FREET3 Anemia work up: Recent Labs    01/06/21 1544  VITAMINB12 415   Sepsis Labs: Recent Labs  Lab 01/06/21 0541 01/07/21 0509 01/08/21 0418 01/09/21 0416  WBC 13.7* 13.5* 14.0* 16.1*    Microbiology Recent Results (from the past 240 hour(s))  Urine Culture     Status: Abnormal   Collection Time: 01/01/21  9:07 AM   Specimen: Urine, Clean Catch  Result Value Ref Range Status   Specimen Description   Final    URINE, CLEAN CATCH Performed at [facility], [address] [place], [place] [address]    Special Requests   Final    NONE Performed at [facility], [address] [place], [place] [address]    Culture (A)  Final    <10,000 COLONIES/mL INSIGNIFICANT GROWTH Performed at [facility], [address] [place], [place] [address]    Report Status 01/02/2021 FINAL  Final  CULTURE, BLOOD (ROUTINE X 2) w Reflex to ID Panel     Status: None   Collection Time: 01/01/21  9:47 AM   Specimen: BLOOD  Result Value Ref Range Status   Specimen Description BLOOD LEFT ARM  Final   Special Requests   Final    BOTTLES DRAWN AEROBIC AND ANAEROBIC Blood Culture adequate volume   Culture   Final    NO GROWTH 5 DAYS Performed at [facility], [address] [place], [place] [address]    Report Status 01/06/2021 FINAL  Final  CULTURE, BLOOD (ROUTINE X 2) w Reflex to ID Panel     Status: None   Collection Time: 01/01/21  9:48 AM   Specimen: BLOOD  Result Value Ref Range Status   Specimen Description BLOOD  LEFT HAND  Final   Special Requests   Final    BOTTLES DRAWN AEROBIC AND ANAEROBIC Blood  Culture adequate volume   Culture   Final    NO GROWTH 5 DAYS Performed at [facility], [address] [place], [place] [address]    Report Status 01/06/2021 FINAL  Final    Procedures and diagnostic studies:  CT ABDOMEN W CONTRAST  Result Date: 01/08/2021 CLINICAL DATA:  Follow-up acute on chronic pancreatitis. EXAM: CT ABDOMEN WITH CONTRAST TECHNIQUE: Multidetector CT imaging of the abdomen was performed using the standard protocol following bolus administration of intravenous contrast. CONTRAST:  100mL OMNIPAQUE IOHEXOL 300 MG/ML  SOLN COMPARISON:  01/01/2021 FINDINGS: Lower chest: Moderate left pleural effusion has increased in volume from previous exam. Smaller right pleural effusion is also increased from previous exam. Subsegmental atelectasis noted in the left lower lobe and lingula. Hepatobiliary: No suspicious liver abnormality. Gallbladder appears collapsed and there is diffuse gallbladder wall edema. No biliary ductal dilatation. Pancreas: There are 2 cystic areas within the head of pancreas. The anterior cystic area measures 2.2 cm, image 38/2. This is compared with 2.5 cm previously. The posterior cystic area measures 2.5 cm. Previously 2.3 cm. No main duct dilatation. Subjectively improved appearance of peripancreatic edema. Spleen: Normal in size without focal abnormality. Adrenals/Urinary Tract: Normal appearance of the adrenal glands. Bilateral pelvocaliectasis appears similar to previous study. No mass or renal calculi. Stomach/Bowel: A feeding tube tip is beyond the ligament of Treitz. Mild increase caliber of the proximal jejunum. Multiple air-filled loops of small bowel are noted which likely reflect underlying ileus. Vascular/Lymphatic: No significant vascular findings are present. No enlarged abdominal or pelvic lymph nodes.  Multiple small upper abdominal lymph nodes are likely reactive. Other: There has been interval decrease in free fluid within the abdomen. There are  maturing fluid collections mild peripheral rim enhancement. Within the left retroperitoneum there is a fluid collection along the left psoas muscle which measures 11.9 by 4.8 cm, image 58/5. Similarly, in the right retroperitoneum there is a maturing fluid collection along the right psoas muscle measures 7.2 x 5.5 cm, image 61/5. Maturing fluid collections are also identified along the midline of the retroperitoneum ventral to aorta and IVC, image 58/2. A small amount of free fluid is noted posterior to the spleen. Musculoskeletal: No acute or significant osseous findings. IMPRESSION: 1. There are maturing fluid collections identified within the retroperitoneum bilaterally. Compared with the previous exam the retroperitoneal fluid is more organized with mild peripheral rim enhancement. 2. Similar appearance of cystic areas within the head of pancreas compatible with pseudo cysts. 3. Increase in volume of bilateral pleural effusions. 4. Decrease in free fluid within the upper abdomen. Electronically Signed   By: Signa Kell M.D.   On: 01/08/2021 11:24               LOS: 11 days   Trinitie Mcgirr  [facility]   Pager on [email or web address]. If 7PM-7AM, please contact night-coverage at [email or web address]     01/09/2021, 2:34 PM

## 2021-01-09 NOTE — Progress Notes (Signed)
[facility] Gastroenterology Inpatient Progress Note  Subjective: Patient seen for f/u acute on chronic pancreatitis. Patient reports he is pain free, no fever in >24 hrs. CT showed marked improvement. WBC still elevated and fluctuating. Patient very insistent to eat something.  Objective: Vital signs in last 24 hours: Temp:  [97.6 F (36.4 C)-99 F (37.2 C)] 97.9 F (36.6 C) (03/02 1120) Pulse Rate:  [80-105] 80 (03/02 1120) Resp:  [16-18] 18 (03/02 1120) BP: (102-133)/(56-81) 102/56 (03/02 1120) SpO2:  [94 %-97 %] 97 % (03/02 1120) Blood pressure (!) 102/56, pulse 80, temperature 97.9 F (36.6 C), temperature source Oral, resp. rate 18, height 6\' 1"  (1.854 m), weight 73 kg, SpO2 97 %.    Intake/Output from previous day: No intake/output data recorded.  Intake/Output this shift: Total I/O In: 700 [NG/GT:600; IV Piggyback:100] Out: 500 [Urine:500]   General appearance: Alert, NAD. Skin warm, dry. Resting comfortably.  Resp:  CTA Cardio: RRR GI:  Soft, nt, nd. BS+ Extremities:  No edema.   Lab Results: Results for orders placed or performed during the hospital encounter of 12/29/20 (from the past 24 hour(s))  Glucose, capillary     Status: Abnormal   Collection Time: 01/08/21  4:38 PM  Result Value Ref Range   Glucose-Capillary 104 (H) 70 - 99 mg/dL  Glucose, capillary     Status: Abnormal   Collection Time: 01/08/21  9:15 PM  Result Value Ref Range   Glucose-Capillary 118 (H) 70 - 99 mg/dL  Glucose, capillary     Status: Abnormal   Collection Time: 01/08/21 11:28 PM  Result Value Ref Range   Glucose-Capillary 112 (H) 70 - 99 mg/dL  Glucose, capillary     Status: Abnormal   Collection Time: 01/09/21  4:06 AM  Result Value Ref Range   Glucose-Capillary 128 (H) 70 - 99 mg/dL  CBC with Differential/Platelet     Status: Abnormal   Collection Time: 01/09/21  4:16 AM  Result Value Ref Range   WBC 16.1 (H) 4.0 - 10.5 K/uL   RBC 3.35 (L) 4.22 - 5.81 MIL/uL    Hemoglobin 10.3 (L) 13.0 - 17.0 g/dL   HCT 03/11/21 (L) 56.8 - 12.7 %   MCV 91.6 80.0 - 100.0 fL   MCH 30.7 26.0 - 34.0 pg   MCHC 33.6 30.0 - 36.0 g/dL   RDW 51.7 00.1 - 74.9 %   Platelets 396 150 - 400 K/uL   nRBC 0.0 0.0 - 0.2 %   Neutrophils Relative % 70 %   Neutro Abs 11.2 (H) 1.7 - 7.7 K/uL   Lymphocytes Relative 8 %   Lymphs Abs 1.3 0.7 - 4.0 K/uL   Monocytes Relative 6 %   Monocytes Absolute 1.0 0.1 - 1.0 K/uL   Eosinophils Relative 15 %   Eosinophils Absolute 2.4 (H) 0.0 - 0.5 K/uL   Basophils Relative 0 %   Basophils Absolute 0.1 0.0 - 0.1 K/uL   WBC Morphology MORPHOLOGY UNREMARKABLE    RBC Morphology MORPHOLOGY UNREMARKABLE    Smear Review Normal platelet morphology    Immature Granulocytes 1 %   Abs Immature Granulocytes 0.15 (H) 0.00 - 0.07 K/uL  Comprehensive metabolic panel     Status: Abnormal   Collection Time: 01/09/21  4:16 AM  Result Value Ref Range   Sodium 134 (L) 135 - 145 mmol/L   Potassium 4.3 3.5 - 5.1 mmol/L   Chloride 98 98 - 111 mmol/L   CO2 26 22 - 32 mmol/L   Glucose,  Bld 125 (H) 70 - 99 mg/dL   BUN 11 6 - 20 mg/dL   Creatinine, Ser 9.52 (L) 0.61 - 1.24 mg/dL   Calcium 8.2 (L) 8.9 - 10.3 mg/dL   Total Protein 5.4 (L) 6.5 - 8.1 g/dL   Albumin 2.2 (L) 3.5 - 5.0 g/dL   AST 34 15 - 41 U/L   ALT 29 0 - 44 U/L   Alkaline Phosphatase 65 38 - 126 U/L   Total Bilirubin 0.5 0.3 - 1.2 mg/dL   GFR, Estimated >84 >13 mL/min   Anion gap 10 5 - 15  Glucose, capillary     Status: Abnormal   Collection Time: 01/09/21  7:28 AM  Result Value Ref Range   Glucose-Capillary 128 (H) 70 - 99 mg/dL  Glucose, capillary     Status: Abnormal   Collection Time: 01/09/21 11:20 AM  Result Value Ref Range   Glucose-Capillary 118 (H) 70 - 99 mg/dL     Recent Labs    24/40/10 0509 01/08/21 0418 01/09/21 0416  WBC 13.5* 14.0* 16.1*  HGB 9.9* 9.9* 10.3*  HCT 28.8* 28.4* 30.7*  PLT 295 336 396   BMET Recent Labs    01/07/21 0509 01/08/21 0418 01/09/21 0416   NA 138 135 134*  K 4.3 4.5 4.3  CL 102 100 98  CO2 28 28 26   GLUCOSE 113* 117* 125*  BUN 9 9 11   CREATININE 0.54* 0.52* 0.60*  CALCIUM 8.0* 8.0* 8.2*   LFT Recent Labs    01/08/21 0418 01/09/21 0416  PROT 4.9* 5.4*  ALBUMIN 2.0* 2.2*  AST 31 34  ALT 23 29  ALKPHOS 59 65  BILITOT 0.6 0.5  BILIDIR 0.1  --   IBILI 0.5  --    PT/INR No results for input(s): LABPROT, INR in the last 72 hours. Hepatitis Panel No results for input(s): HEPBSAG, HCVAB, HEPAIGM, HEPBIGM in the last 72 hours. C-Diff No results for input(s): CDIFFTOX in the last 72 hours. No results for input(s): CDIFFPCR in the last 72 hours.   Studies/Results: CT ABDOMEN W CONTRAST  Result Date: 01/08/2021 CLINICAL DATA:  Follow-up acute on chronic pancreatitis. EXAM: CT ABDOMEN WITH CONTRAST TECHNIQUE: Multidetector CT imaging of the abdomen was performed using the standard protocol following bolus administration of intravenous contrast. CONTRAST:  03/11/21 OMNIPAQUE IOHEXOL 300 MG/ML  SOLN COMPARISON:  01/01/2021 FINDINGS: Lower chest: Moderate left pleural effusion has increased in volume from previous exam. Smaller right pleural effusion is also increased from previous exam. Subsegmental atelectasis noted in the left lower lobe and lingula. Hepatobiliary: No suspicious liver abnormality. Gallbladder appears collapsed and there is diffuse gallbladder wall edema. No biliary ductal dilatation. Pancreas: There are 2 cystic areas within the head of pancreas. The anterior cystic area measures 2.2 cm, image 38/2. This is compared with 2.5 cm previously. The posterior cystic area measures 2.5 cm. Previously 2.3 cm. No main duct dilatation. Subjectively improved appearance of peripancreatic edema. Spleen: Normal in size without focal abnormality. Adrenals/Urinary Tract: Normal appearance of the adrenal glands. Bilateral pelvocaliectasis appears similar to previous study. No mass or renal calculi. Stomach/Bowel: A feeding tube tip  is beyond the ligament of Treitz. Mild increase caliber of the proximal jejunum. Multiple air-filled loops of small bowel are noted which likely reflect underlying ileus. Vascular/Lymphatic: No significant vascular findings are present. No enlarged abdominal or pelvic lymph nodes. Multiple small upper abdominal lymph nodes are likely reactive. Other: There has been interval decrease in free fluid within the abdomen.  There are maturing fluid collections mild peripheral rim enhancement. Within the left retroperitoneum there is a fluid collection along the left psoas muscle which measures 11.9 by 4.8 cm, image 58/5. Similarly, in the right retroperitoneum there is a maturing fluid collection along the right psoas muscle measures 7.2 x 5.5 cm, image 61/5. Maturing fluid collections are also identified along the midline of the retroperitoneum ventral to aorta and IVC, image 58/2. A small amount of free fluid is noted posterior to the spleen. Musculoskeletal: No acute or significant osseous findings. IMPRESSION: 1. There are maturing fluid collections identified within the retroperitoneum bilaterally. Compared with the previous exam the retroperitoneal fluid is more organized with mild peripheral rim enhancement. 2. Similar appearance of cystic areas within the head of pancreas compatible with pseudo cysts. 3. Increase in volume of bilateral pleural effusions. 4. Decrease in free fluid within the upper abdomen. Electronically Signed   By: Signa Kell M.D.   On: 01/08/2021 11:24    Scheduled Inpatient Medications:   . ascorbic acid  500 mg Per Tube BID  . feeding supplement (PROSource TF)  45 mL Per Tube Daily  . free water  30 mL Per Tube Q4H  . metoprolol tartrate  50 mg Per Tube BID  . pantoprazole (PROTONIX) IV  40 mg Intravenous Q24H  . rivaroxaban  20 mg Per Tube Q supper  . vitamin A  10,000 Units Per Tube Daily  . [START ON 01/11/2021] Vitamin D (Ergocalciferol)  50,000 Units Per Tube Q7 days  . zinc  sulfate  220 mg Per Tube Daily    Continuous Inpatient Infusions:   . feeding supplement (VITAL 1.5 CAL) 1,000 mL (01/08/21 0533)  . meropenem (MERREM) IV 1 g (01/09/21 0909)    PRN Inpatient Medications:  acetaminophen, diphenhydrAMINE, LORazepam, methocarbamol, ondansetron **OR** ondansetron (ZOFRAN) IV, oxymetazoline    Assessment:  1. Acute on chronic pancreatitis - Marked clinical improvement. Only elevated WBC, all other parameters are normalizing.  Plan:  1. D/C dobhoff 2. Soft mechanical diet with po creon supplements. 3. Monitor clinically and with daily labs.  Teodoro K. Norma Fredrickson, M.D. 01/09/2021, 2:24 PM

## 2021-01-10 ENCOUNTER — Inpatient Hospital Stay: Payer: Medicaid Other

## 2021-01-10 ENCOUNTER — Encounter: Payer: Self-pay | Admitting: Internal Medicine

## 2021-01-10 LAB — CBC WITH DIFFERENTIAL/PLATELET
Abs Immature Granulocytes: 0.21 10*3/uL — ABNORMAL HIGH (ref 0.00–0.07)
Basophils Absolute: 0.1 10*3/uL (ref 0.0–0.1)
Basophils Relative: 1 %
Eosinophils Absolute: 3.7 10*3/uL — ABNORMAL HIGH (ref 0.0–0.5)
Eosinophils Relative: 21 %
HCT: 32.4 % — ABNORMAL LOW (ref 39.0–52.0)
Hemoglobin: 10.7 g/dL — ABNORMAL LOW (ref 13.0–17.0)
Immature Granulocytes: 1 %
Lymphocytes Relative: 10 %
Lymphs Abs: 1.7 10*3/uL (ref 0.7–4.0)
MCH: 30.6 pg (ref 26.0–34.0)
MCHC: 33 g/dL (ref 30.0–36.0)
MCV: 92.6 fL (ref 80.0–100.0)
Monocytes Absolute: 1.4 10*3/uL — ABNORMAL HIGH (ref 0.1–1.0)
Monocytes Relative: 8 %
Neutro Abs: 10.3 10*3/uL — ABNORMAL HIGH (ref 1.7–7.7)
Neutrophils Relative %: 59 %
Platelets: 464 10*3/uL — ABNORMAL HIGH (ref 150–400)
RBC: 3.5 MIL/uL — ABNORMAL LOW (ref 4.22–5.81)
RDW: 12.9 % (ref 11.5–15.5)
Smear Review: NORMAL
WBC: 17.4 10*3/uL — ABNORMAL HIGH (ref 4.0–10.5)
nRBC: 0 % (ref 0.0–0.2)

## 2021-01-10 LAB — VITAMIN C: Vitamin C: 0.5 mg/dL (ref 0.4–2.0)

## 2021-01-10 LAB — COMPREHENSIVE METABOLIC PANEL
ALT: 28 U/L (ref 0–44)
AST: 27 U/L (ref 15–41)
Albumin: 2.6 g/dL — ABNORMAL LOW (ref 3.5–5.0)
Alkaline Phosphatase: 69 U/L (ref 38–126)
Anion gap: 9 (ref 5–15)
BUN: 12 mg/dL (ref 6–20)
CO2: 27 mmol/L (ref 22–32)
Calcium: 9 mg/dL (ref 8.9–10.3)
Chloride: 99 mmol/L (ref 98–111)
Creatinine, Ser: 0.59 mg/dL — ABNORMAL LOW (ref 0.61–1.24)
GFR, Estimated: >60 mL/min (ref >60)
Glucose, Bld: 102 mg/dL — ABNORMAL HIGH (ref 70–99)
Potassium: 4.7 mmol/L (ref 3.5–5.1)
Sodium: 135 mmol/L (ref 135–145)
Total Bilirubin: 0.8 mg/dL (ref 0.3–1.2)
Total Protein: 6.3 g/dL — ABNORMAL LOW (ref 6.5–8.1)

## 2021-01-10 LAB — LACTATE DEHYDROGENASE: LDH: 211 U/L — ABNORMAL HIGH (ref 98–192)

## 2021-01-10 LAB — GLUCOSE, CAPILLARY
Glucose-Capillary: 92 mg/dL (ref 70–99)
Glucose-Capillary: 108 mg/dL — ABNORMAL HIGH (ref 70–99)

## 2021-01-10 IMAGING — CT CT ABD-PELV W/ CM
2 of 4 series · 15 of 46 positions shown, 17 images · IV contrast (APPLIED)
Comparison: [DATE]

CLINICAL DATA: Pancreatitis, follow-up

EXAM:
CT ABDOMEN AND PELVIS WITH CONTRAST
TECHNIQUE: Multidetector CT imaging of the abdomen and pelvis was performed
using the standard protocol following bolus administration of
intravenous contrast. Sagittal and coronal MPR images reconstructed
from axial data set.
CONTRAST:  100mL OMNIPAQUE IOHEXOL 300 MG/ML SOLN IV. No oral
contrast.

[Series 2: routine abd/pel with · axial · 0.69mm/px · z∈[-456,-16]mm · 12 of 100 slices shown, 14 images]
[im 8/100  soft-tissue]
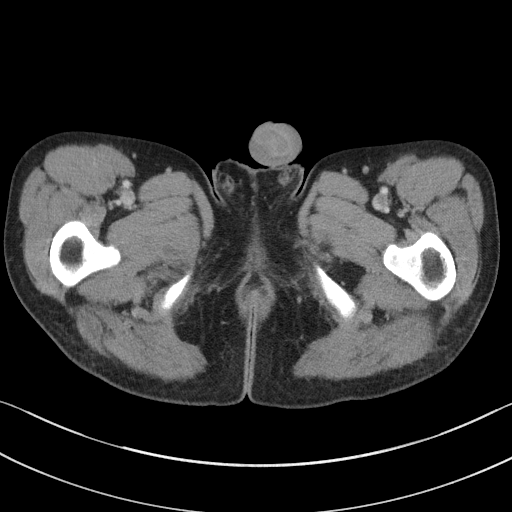
[im 8/100  bone]
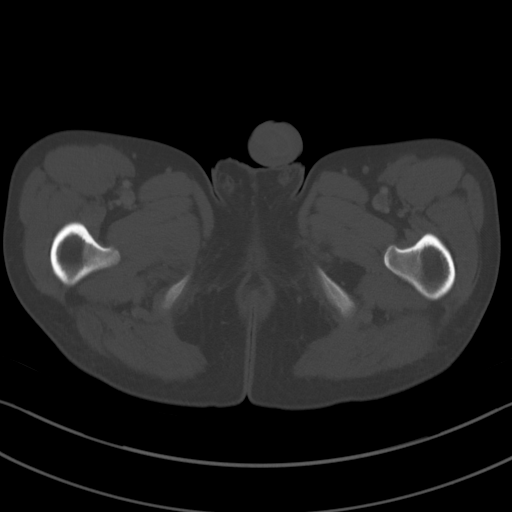
[im 16/100  soft-tissue]
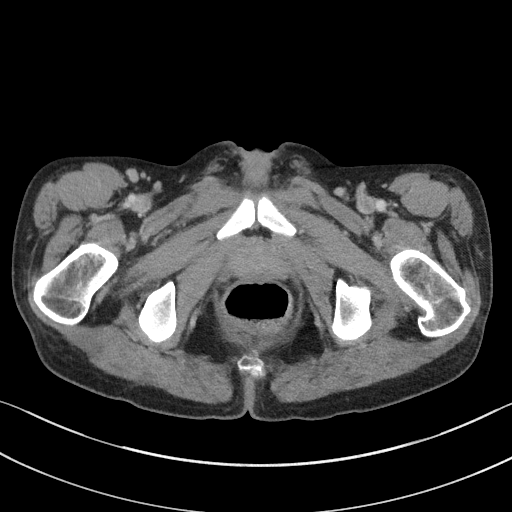
[im 24/100  soft-tissue]
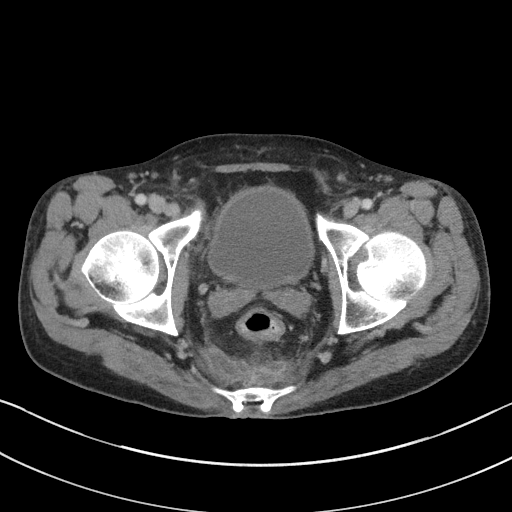
[im 32/100  soft-tissue]
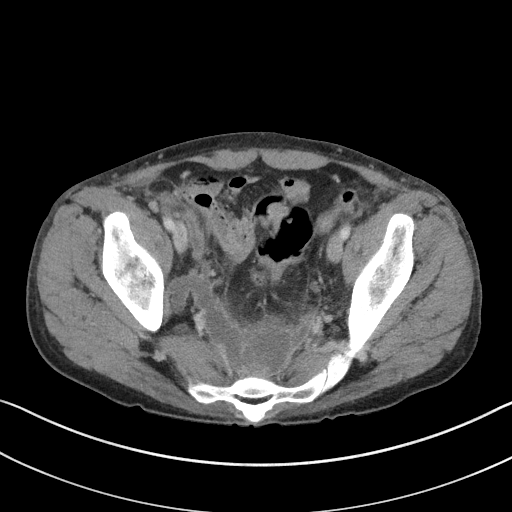
[im 40/100  soft-tissue]
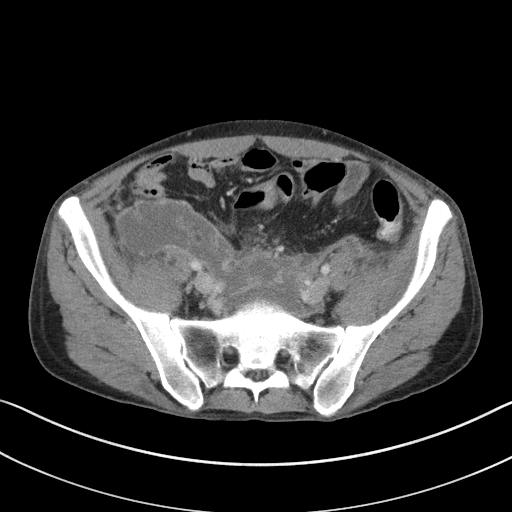
[im 48/100  soft-tissue]
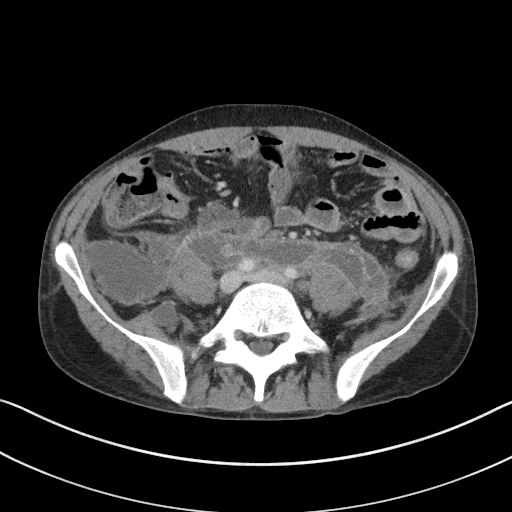
[im 56/100  soft-tissue]
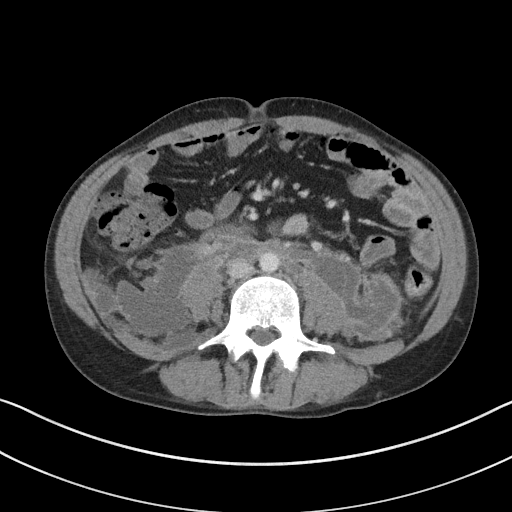
[im 64/100  soft-tissue]
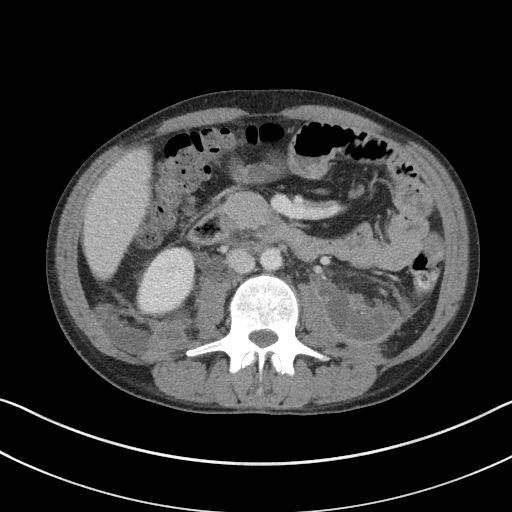
[im 72/100  soft-tissue]
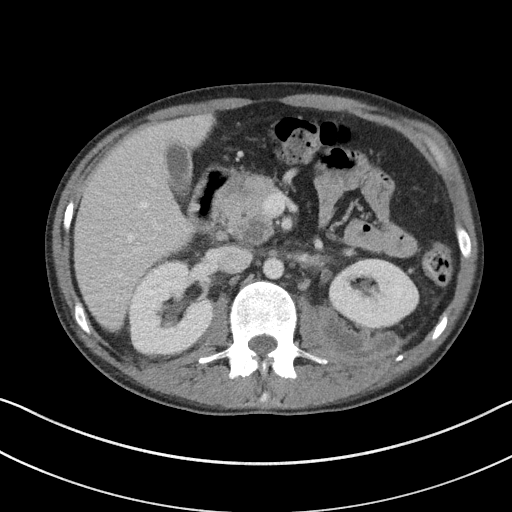
[im 72/100  bone]
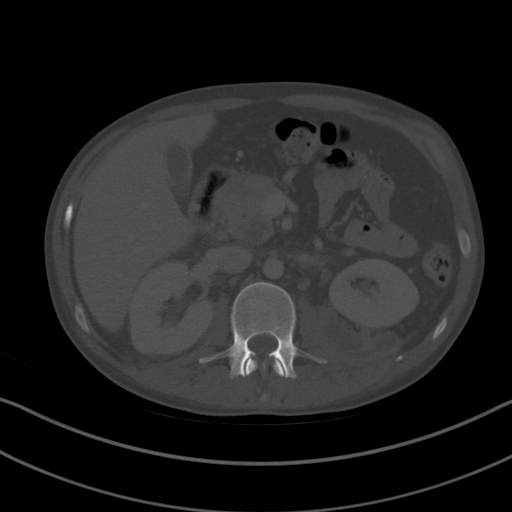
[im 80/100  soft-tissue]
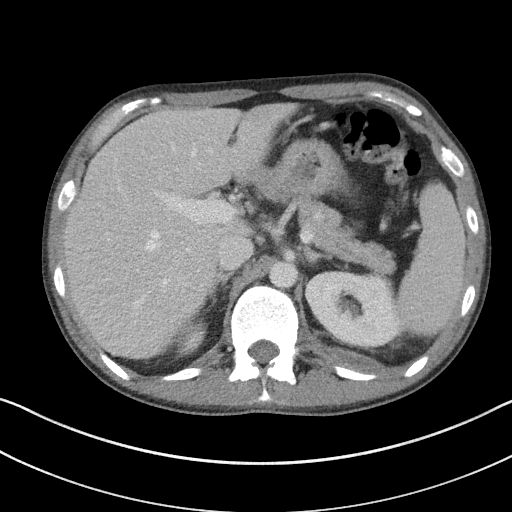
[im 88/100  soft-tissue]
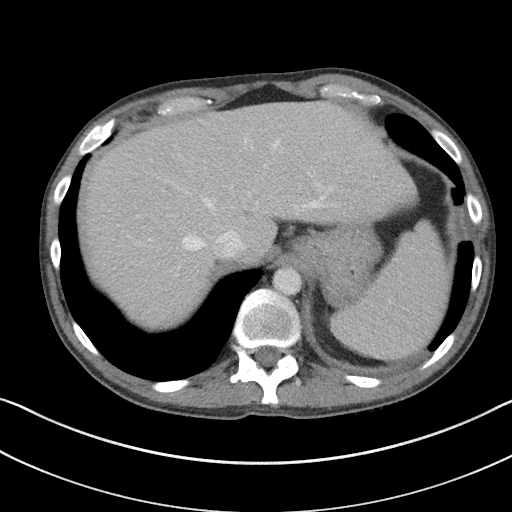
[im 96/100  soft-tissue]
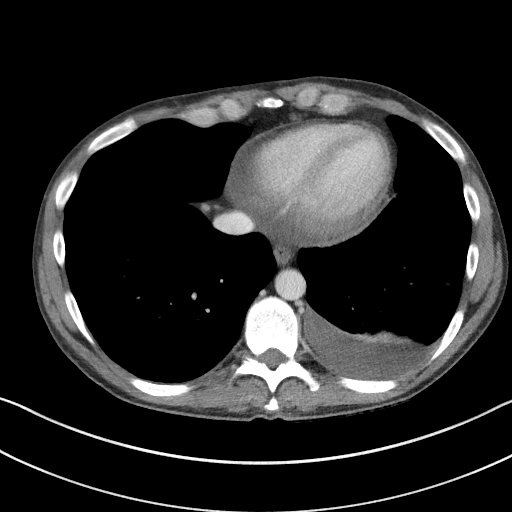

[Series 5: coronal st · coronal · 0.64mm/px · 3 of 82 slices shown]
[im 28/82  soft-tissue]
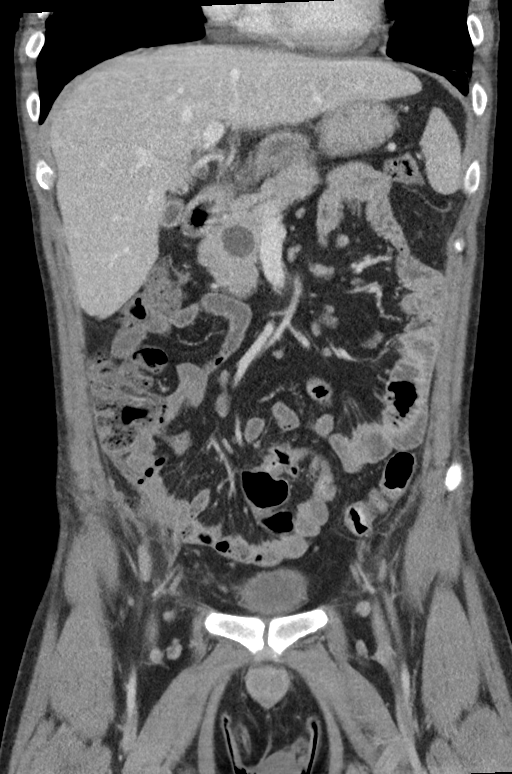
[im 37/82  soft-tissue]
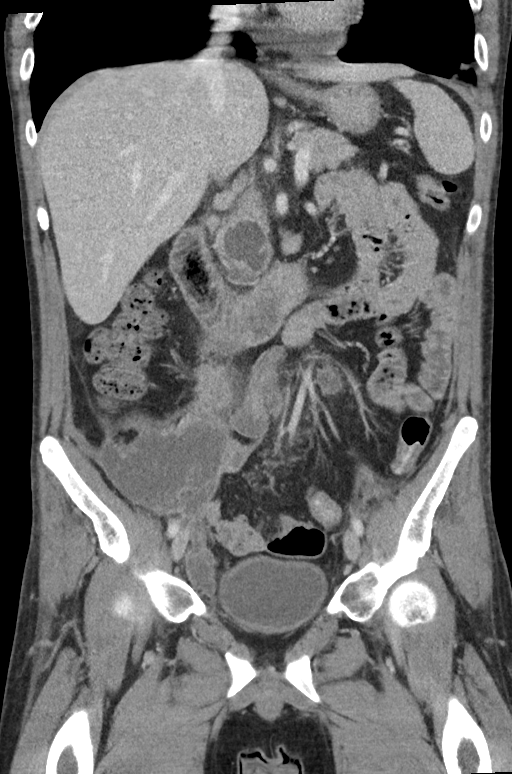
[im 46/82  soft-tissue]
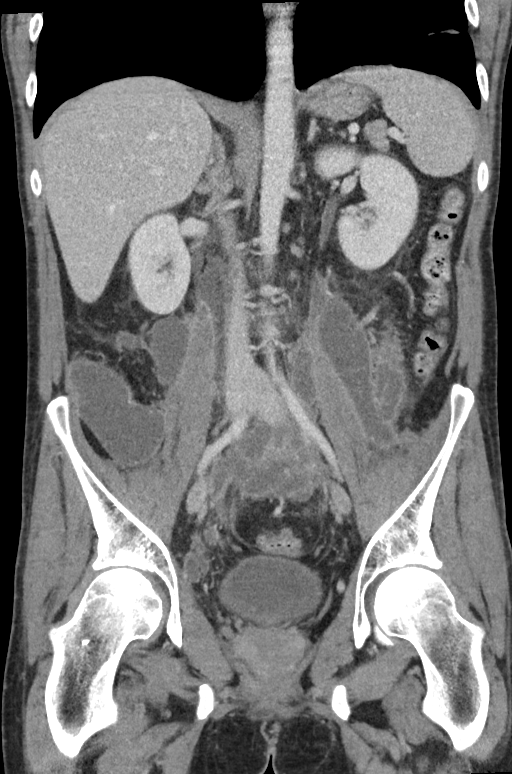

[15 of 46 positions shown; findings below may reference images not displayed]

FINDINGS: Lower chest: Small LEFT pleural effusion with compressive
atelectasis of LEFT lower lobe.

Hepatobiliary: Gallbladder and liver normal appearance

Pancreas: 2 cysts are identified at the pancreatic head, measuring
2.9 x 2.3 cm posteriorly slightly increased and 2.2 x 2.1 cm more
anteriorly stable. Remainder of pancreas normal appearance. No
evidence of hemorrhage, additional mass or calcification. No
definite peripancreatic edema.

Spleen: Normal appearance

Adrenals/Urinary Tract: Adrenal glands, kidneys, visualized ureters,
and bladder normal appearance. Mid ureters are obscured bilaterally
by retroperitoneal process.

Stomach/Bowel: Stomach decompressed. Large and small bowel loops
normal appearance. Appendix not localized.

Vascular/Lymphatic: Vascular structures patent. Aorta normal
caliber. Scattered normal size mesenteric lymph nodes.

Reproductive: Unremarkable prostate gland and seminal vesicles

Other: Extensive infiltration of the retroperitoneum by loculated
fluid collections involving the posterior pararenal spaces
bilaterally extending into pelvis to the presacral region.
Additional extension anterior to the distal aorta and aortic
bifurcation. Collections demonstrate irregular and enhancing
margins. Findings are consistent with extensive pseudocyst formation
dissecting through the retroperitoneum into the pelvis. No definite
gas identified within these collections. No ascites or free air.
Small BILATERAL inguinal hernias containing fat.

Musculoskeletal: Unremarkable
IMPRESSION: Extensive infiltration of the retroperitoneum involving the
posterior pararenal spaces bilaterally extending into the pelvis to
the presacral region consistent with extensive pseudocyst formation
dissecting through the retroperitoneum into the pelvis, little
changed.

No definite gas identified within these collections.

Small LEFT pleural effusion with compressive atelectasis of LEFT
lower lobe.

Two pancreatic head cysts, larger of which has slightly increased
since previous exam.

Small BILATERAL inguinal hernias containing fat.

## 2021-01-10 IMAGING — US US CHEST/MEDIASTINUM
1 series · 3 of 3 positions shown · non-contrast
Comparison: Abdominal CT-[DATE]

CLINICAL DATA: History of acute on chronic pancreatitis, now with
left-sided pleural effusion. Please perform left-sided chest
ultrasound ultrasound-guided thoracentesis as indicated.

EXAM:
CHEST ULTRASOUND

[Series 1: us chest/mediastinum · 3 of 3 slices shown]
[im 1/3]
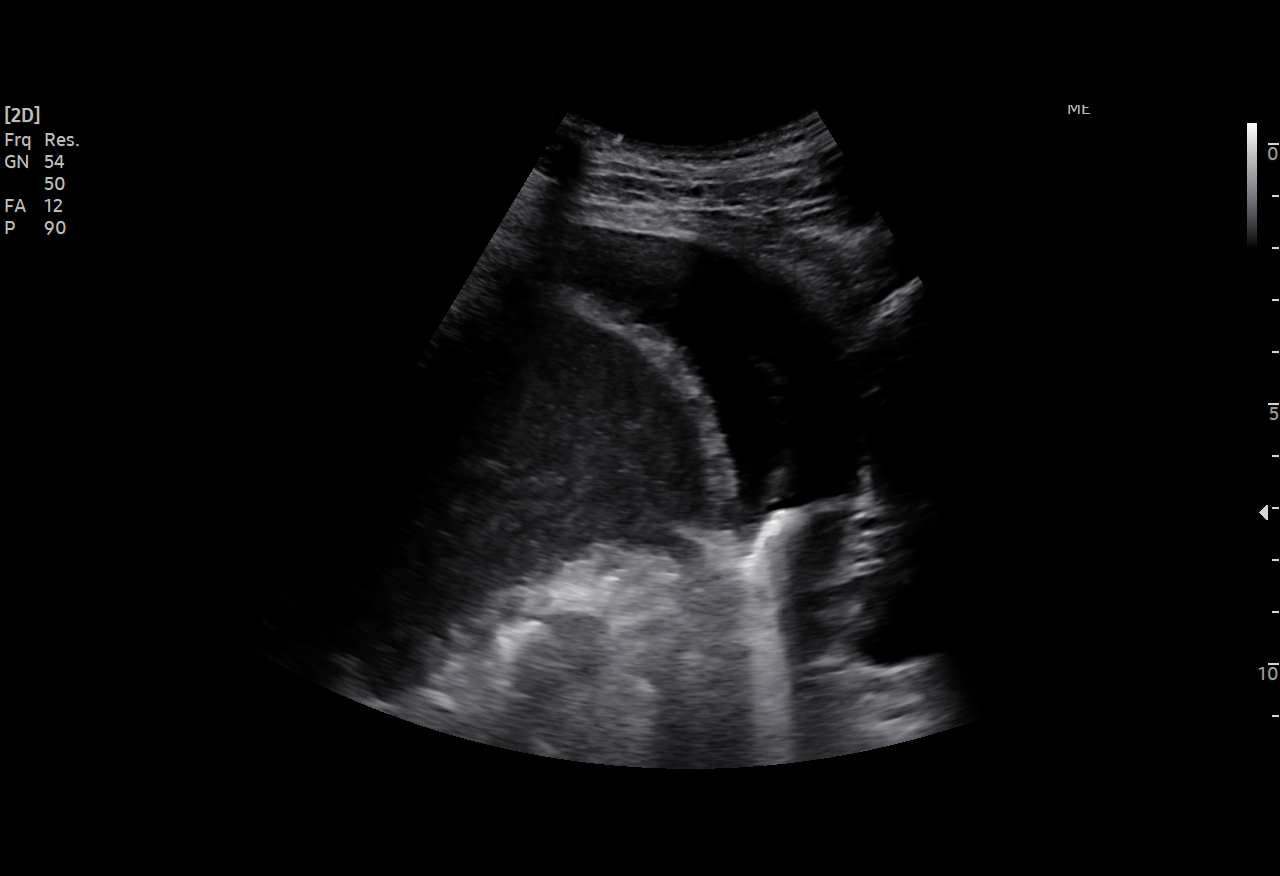
[im 2/3]
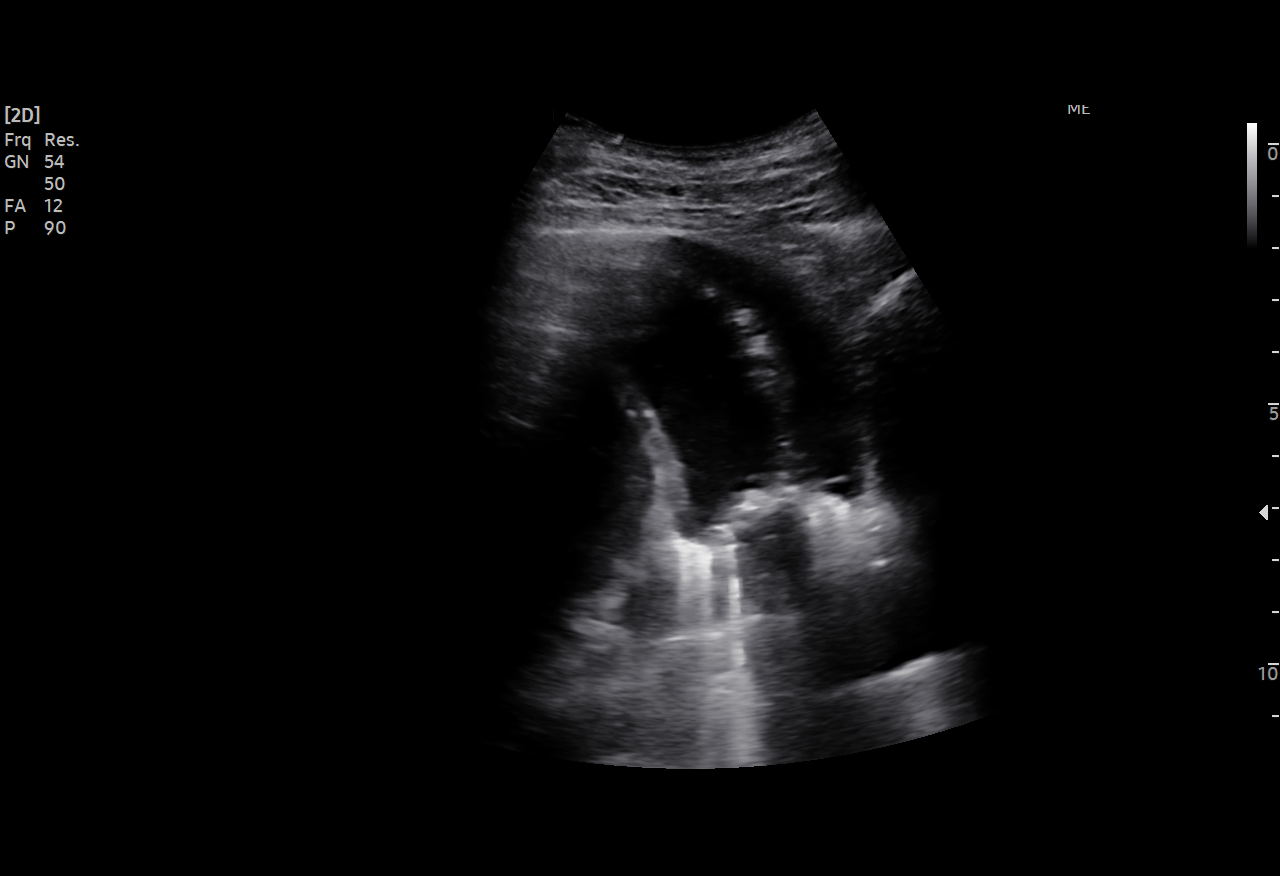
[im 3/3]
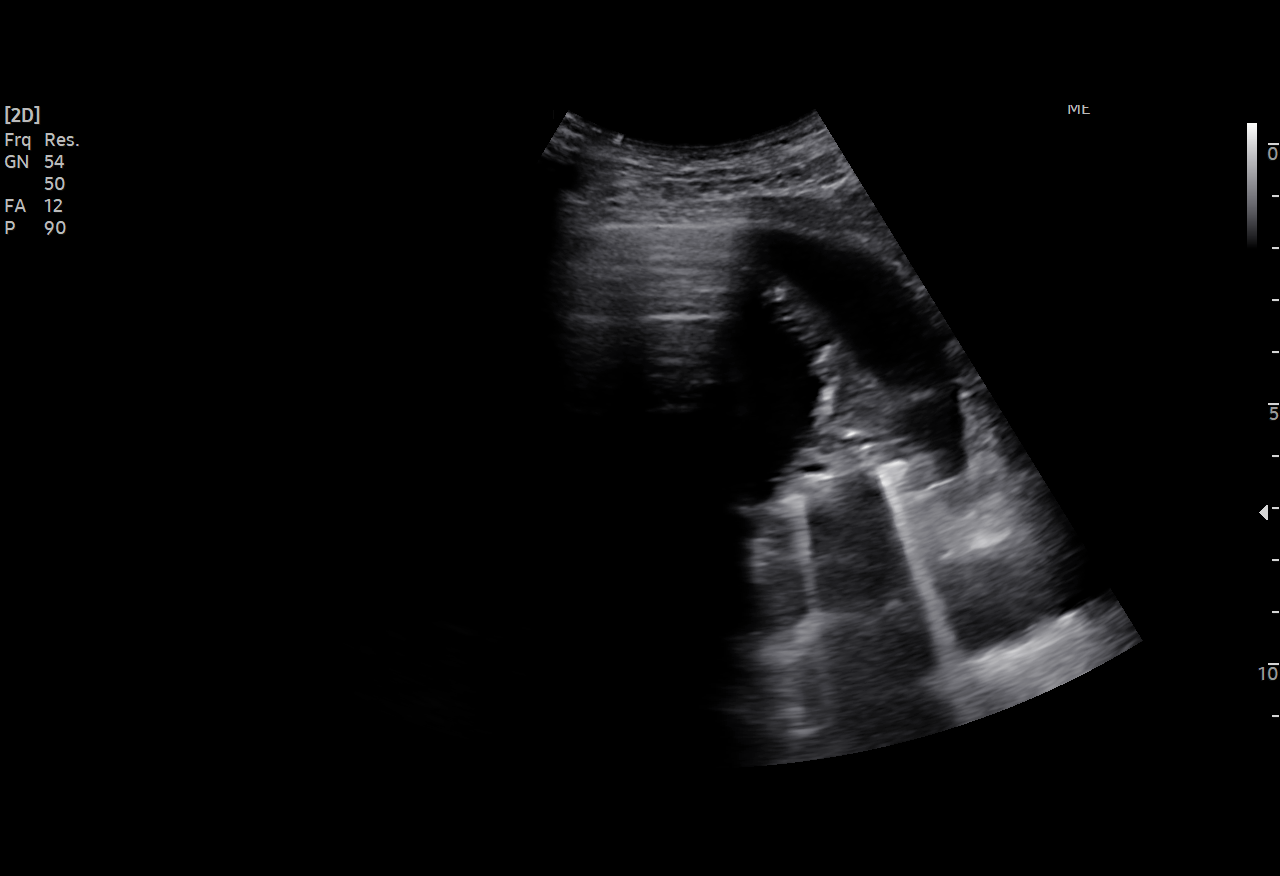

[3 of 3 positions shown; findings below may reference images not displayed]

FINDINGS: Sonographic evaluation the left chest demonstrates a small/trace
left-sided pleural effusion however there is interposition of the
left lower lobe rendering a poor percutaneous window. As such, the
decision was made not to proceed with ultrasound-guided
thoracentesis at this time.
IMPRESSION: Small/trace left-sided pleural effusion with interposition of the
left lower lung, currently too small to allow for safe
ultrasound-guided thoracentesis.

Above findings discussed with Dr. GAB at the time of procedure
cancellation.

## 2021-01-10 MED ORDER — IOHEXOL 300 MG/ML  SOLN
100.0000 mL | Freq: Once | INTRAMUSCULAR | Status: AC | PRN
  Administered 2021-01-10: 100 mL via INTRAVENOUS

## 2021-01-10 MED ORDER — PANCRELIPASE (LIP-PROT-AMYL) 36000-114000 UNITS PO CPEP: 36000.0000 [IU] | ORAL_CAPSULE | Freq: Three times a day (TID) | ORAL | 0 refills | Status: AC

## 2021-01-10 MED ORDER — ADULT MULTIVITAMIN W/MINERALS CH: 1.0000 | ORAL_TABLET | Freq: Every day | ORAL | Status: DC

## 2021-01-10 MED ORDER — ENSURE ENLIVE PO LIQD
237.0000 mL | Freq: Two times a day (BID) | ORAL | Status: DC
  Administered 2021-01-10: 237 mL via ORAL

## 2021-01-10 NOTE — Progress Notes (Signed)
[facility] Gastroenterology Inpatient Progress Note  Subjective: Patient seen forf/u pancreatitis. Patient tolerated soft diet yesterday and today without abdominal pain, nausea, vomiting. Feels well. No fevers.  Objective: Vital signs in last 24 hours: Temp:  [97.9 F (36.6 C)-98.7 F (37.1 C)] 97.9 F (36.6 C) (03/03 0317) Pulse Rate:  [78-105] 78 (03/03 0317) Resp:  [18-24] 20 (03/03 0317) BP: (102-124)/(56-80) 117/73 (03/03 0317) SpO2:  [97 %-100 %] 100 % (03/03 0317) Weight:  [72.9 kg] 72.9 kg (03/03 0317) Blood pressure 117/73, pulse 78, temperature 97.9 F (36.6 C), temperature source Oral, resp. rate 20, height 6\' 1"  (1.854 m), weight 72.9 kg, SpO2 100 %.    Intake/Output from previous day: 03/02 0701 - 03/03 0700 In: 1620 [P.O.:720; NG/GT:600; IV Piggyback:300] Out: 2750 [Urine:2750]  Intake/Output this shift: No intake/output data recorded.   General appearance:  Alert, NAD.PERRLA Resp:  CTA no wheezes Cardio:  RRR GI: Soft, nt, nd. BS+ Extremities: No edema.   Lab Results: Results for orders placed or performed during the hospital encounter of 12/29/20 (from the past 24 hour(s))  Glucose, capillary     Status: Abnormal   Collection Time: 01/09/21 11:20 AM  Result Value Ref Range   Glucose-Capillary 118 (H) 70 - 99 mg/dL  Glucose, capillary     Status: Abnormal   Collection Time: 01/09/21  4:28 PM  Result Value Ref Range   Glucose-Capillary 114 (H) 70 - 99 mg/dL  Glucose, capillary     Status: Abnormal   Collection Time: 01/09/21  7:48 PM  Result Value Ref Range   Glucose-Capillary 140 (H) 70 - 99 mg/dL   Comment 1 Notify RN   Glucose, capillary     Status: None   Collection Time: 01/09/21 11:11 PM  Result Value Ref Range   Glucose-Capillary 98 70 - 99 mg/dL   Comment 1 Notify RN   Glucose, capillary     Status: None   Collection Time: 01/10/21  3:50 AM  Result Value Ref Range   Glucose-Capillary 92 70 - 99 mg/dL   Comment 1 Notify RN   CBC  with Differential/Platelet     Status: Abnormal   Collection Time: 01/10/21  6:03 AM  Result Value Ref Range   WBC 17.4 (H) 4.0 - 10.5 K/uL   RBC 3.50 (L) 4.22 - 5.81 MIL/uL   Hemoglobin 10.7 (L) 13.0 - 17.0 g/dL   HCT 03/12/21 (L) 54.6 - 50.3 %   MCV 92.6 80.0 - 100.0 fL   MCH 30.6 26.0 - 34.0 pg   MCHC 33.0 30.0 - 36.0 g/dL   RDW 54.6 56.8 - 12.7 %   Platelets 464 (H) 150 - 400 K/uL   nRBC 0.0 0.0 - 0.2 %   Neutrophils Relative % 59 %   Neutro Abs 10.3 (H) 1.7 - 7.7 K/uL   Lymphocytes Relative 10 %   Lymphs Abs 1.7 0.7 - 4.0 K/uL   Monocytes Relative 8 %   Monocytes Absolute 1.4 (H) 0.1 - 1.0 K/uL   Eosinophils Relative 21 %   Eosinophils Absolute 3.7 (H) 0.0 - 0.5 K/uL   Basophils Relative 1 %   Basophils Absolute 0.1 0.0 - 0.1 K/uL   Smear Review Normal platelet morphology    Immature Granulocytes 1 %   Abs Immature Granulocytes 0.21 (H) 0.00 - 0.07 K/uL   Polychromasia PRESENT   Comprehensive metabolic panel     Status: Abnormal   Collection Time: 01/10/21  6:03 AM  Result Value Ref Range  Sodium 135 135 - 145 mmol/L   Potassium 4.7 3.5 - 5.1 mmol/L   Chloride 99 98 - 111 mmol/L   CO2 27 22 - 32 mmol/L   Glucose, Bld 102 (H) 70 - 99 mg/dL   BUN 12 6 - 20 mg/dL   Creatinine, Ser 7.82 (L) 0.61 - 1.24 mg/dL   Calcium 9.0 8.9 - 42.3 mg/dL   Total Protein 6.3 (L) 6.5 - 8.1 g/dL   Albumin 2.6 (L) 3.5 - 5.0 g/dL   AST 27 15 - 41 U/L   ALT 28 0 - 44 U/L   Alkaline Phosphatase 69 38 - 126 U/L   Total Bilirubin 0.8 0.3 - 1.2 mg/dL   GFR, Estimated >53 >61 mL/min   Anion gap 9 5 - 15  Glucose, capillary     Status: Abnormal   Collection Time: 01/10/21  8:31 AM  Result Value Ref Range   Glucose-Capillary 108 (H) 70 - 99 mg/dL     Recent Labs    44/31/54 0418 01/09/21 0416 01/10/21 0603  WBC 14.0* 16.1* 17.4*  HGB 9.9* 10.3* 10.7*  HCT 28.4* 30.7* 32.4*  PLT 336 396 464*   BMET Recent Labs    01/08/21 0418 01/09/21 0416 01/10/21 0603  NA 135 134* 135  K 4.5  4.3 4.7  CL 100 98 99  CO2 28 26 27   GLUCOSE 117* 125* 102*  BUN 9 11 12   CREATININE 0.52* 0.60* 0.59*  CALCIUM 8.0* 8.2* 9.0   LFT Recent Labs    01/08/21 0418 01/09/21 0416 01/10/21 0603  PROT 4.9*   < > 6.3*  ALBUMIN 2.0*   < > 2.6*  AST 31   < > 27  ALT 23   < > 28  ALKPHOS 59   < > 69  BILITOT 0.6   < > 0.8  BILIDIR 0.1  --   --   IBILI 0.5  --   --    < > = values in this interval not displayed.   PT/INR No results for input(s): LABPROT, INR in the last 72 hours. Hepatitis Panel No results for input(s): HEPBSAG, HCVAB, HEPAIGM, HEPBIGM in the last 72 hours. C-Diff No results for input(s): CDIFFTOX in the last 72 hours. No results for input(s): CDIFFPCR in the last 72 hours.   Studies/Results: No results found.  Scheduled Inpatient Medications:   . ascorbic acid  500 mg Per Tube BID  . feeding supplement (PROSource TF)  45 mL Per Tube Daily  . lipase/protease/amylase  36,000 Units Oral TID AC  . metoprolol tartrate  50 mg Per Tube BID  . pantoprazole (PROTONIX) IV  40 mg Intravenous Q24H  . rivaroxaban  20 mg Per Tube Q supper  . vitamin A  10,000 Units Per Tube Daily  . [START ON 01/11/2021] Vitamin D (Ergocalciferol)  50,000 Units Per Tube Q7 days  . zinc sulfate  220 mg Per Tube Daily    Continuous Inpatient Infusions:   . feeding supplement (VITAL 1.5 CAL) 1,000 mL (01/08/21 0533)  . meropenem (MERREM) IV Stopped (01/10/21 0341)    PRN Inpatient Medications:  acetaminophen, diphenhydrAMINE, LORazepam, methocarbamol, ondansetron **OR** ondansetron (ZOFRAN) IV, oxymetazoline    Assessment:  1. Acute on chronic pancreatitis. Clinically resolved. Tolerating po diet.  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 Will need some outpatient monitoring and possible drainage if reach a much larger size. 3. History of alcohol abuse. 4. Low back pain.  Plan:  1. Discontinue IV antibiotics. 2. Continue diet and po creon  supplements. 3. Discharge from a GI standpoint within the next 12-24 hrs if no significant changes. 4. Continue PO Creon in the outpatient setting with meals. 5. Follow up with GI in 3-4 weeks.Shane Jenkins. Norma Fredrickson, M.D. 01/10/2021, 8:41 AM

## 2021-01-10 NOTE — Discharge Summary (Signed)
Physician Discharge Summary  Shane Jenkins. EVO:[id number] DOB: 01/15/1986 DOA: 12/29/2020  PCP: [name], MD  Admit date: 12/29/2020 Discharge date: 01/10/2021  Discharge disposition: Home   Recommendations for Outpatient Follow-Up:   Follow-up with [facility] in 3 weeks   Discharge Diagnosis:   Principal Problem:   Acute on chronic pancreatitis (HCC) Active Problems:   Nicotine dependence   Malnutrition of moderate degree   Bilateral pleural effusion    Discharge Condition: Stable.  Diet recommendation:  Diet Order            Diet - low sodium heart healthy           DIET SOFT Room service appropriate? Yes; Fluid consistency: Thin  Diet effective now                   Code Status: Full Code     Hospital Course:    Shane Ruta [name] a 34 y.o.malewith medical history significant forchronic alcoholic pancreatitis admitted multiple times for acute pancreatitis, history of venous thromboembolic disease, tobacco use disorder, who presented to the hospital with worsening abdominal pain.  He had been seen in the emergency room about 5 days prior to admission for mild pancreatitis and he had been discharged to home (from the ED)  on oxycodone at that time.  He said he took about 2 to 3 tablets of oxycodone and the following day he developed a generalized erythematous macular rash on the trunk.  He was admitted to the hospital for acute on chronic pancreatitis.  CT abdomen and pelvis showed pancreatic pseudocyst and extensive pseudocyst formation in the retroperitoneal region.  There was no evidence of abscess .Marland Kitchen  He had low-grade fever so he was treated empirically with IV antibiotics.  Because of poor diet tolerance, postpyloric Dobbhoff tube was placed by interventional radiologist for enteral nutrition.  Gastroenterologist was consulted to assist with management. There was incidental finding of bilateral pleural effusion noted on CT abdomen.  There was  more on the left compared to the right.  Left-sided thoracentesis was ordered.  However ultrasound showed only small pleural effusion so interventional radiologist did not proceed with thoracentesis.  Case was discussed with Dr. Grace Isaac, radiologist  His condition has improved.  He is completely asymptomatic.  He has been able to tolerate a diet.  He is deemed stable for discharge to home.   Medical Consultants:    Gastroenterologist  Interventional radiologist  Otolaryngologist   Discharge Exam:    Vitals:   01/10/21 0900 01/10/21 1126 01/10/21 1618 01/10/21 1642  BP: 99/68 109/71 111/75   Pulse: (!) 106 92 (!) 121 (!) 116  Resp: 18 16 20    Temp:  97.8 F (36.6 C) 98 F (36.7 C)   TempSrc:  Oral Oral   SpO2: 98% 100% 99%   Weight:      Height:         GEN: NAD SKIN: No rash EYES: EOMI ENT: MMM CV: RRR PULM: CTA B ABD: soft, ND, NT, +BS CNS: AAO x 3, non focal EXT: No edema or tenderness   The results of significant diagnostics from this hospitalization (including imaging, microbiology, ancillary and laboratory) are listed below for reference.     Procedures and Diagnostic Studies:   No results found.   Labs:   Basic Metabolic Panel: Recent Labs  Lab 01/05/21 0333 01/06/21 0541 01/07/21 0509 01/08/21 0418 01/09/21 0416 01/10/21 0603  NA 136 136 138 135 134* 135  K 3.1*  3.5 4.3 4.5 4.3 4.7  CL 103 103 102 100 98 99  CO2 24 26 28 28 26 27   GLUCOSE 117* 130* 113* 117* 125* 102*  BUN 8 7 9 9 11 12   CREATININE 0.63 0.56* 0.54* 0.52* 0.60* 0.59*  CALCIUM 7.7* 7.6* 8.0* 8.0* 8.2* 9.0  MG 1.6* 1.7 2.0 1.9  --   --   PHOS 3.2 2.7 4.2 4.9*  --   --    GFR Estimated Creatinine Clearance: 134.2 mL/min (A) (by C-G formula based on SCr of 0.59 mg/dL (L)). Liver Function Tests: Recent Labs  Lab 01/06/21 0541 01/07/21 0509 01/08/21 0418 01/09/21 0416 01/10/21 0603  AST 38 28 31 34 27  ALT 23 22 23 29 28   ALKPHOS 59 59 59 65 69  BILITOT 0.5 0.4 0.6  0.5 0.8  PROT 4.6* 4.9* 4.9* 5.4* 6.3*  ALBUMIN 1.9* 2.0* 2.0* 2.2* 2.6*   No results for input(s): LIPASE, AMYLASE in the last 168 hours. No results for input(s): AMMONIA in the last 168 hours. Coagulation profile No results for input(s): INR, PROTIME in the last 168 hours.  CBC: Recent Labs  Lab 01/06/21 0541 01/07/21 0509 01/08/21 0418 01/09/21 0416 01/10/21 0603  WBC 13.7* 13.5* 14.0* 16.1* 17.4*  NEUTROABS 10.8* 10.1* 10.4* 11.2* 10.3*  HGB 9.2* 9.9* 9.9* 10.3* 10.7*  HCT 26.6* 28.8* 28.4* 30.7* 32.4*  MCV 91.4 92.6 91.6 91.6 92.6  PLT 271 295 336 396 464*   Cardiac Enzymes: No results for input(s): CKTOTAL, CKMB, CKMBINDEX, TROPONINI in the last 168 hours. BNP: Invalid input(s): POCBNP CBG: Recent Labs  Lab 01/09/21 1628 01/09/21 1948 01/09/21 2311 01/10/21 0350 01/10/21 0831  GLUCAP 114* 140* 98 92 108*   D-Dimer No results for input(s): DDIMER in the last 72 hours. Hgb A1c No results for input(s): HGBA1C in the last 72 hours. Lipid Profile No results for input(s): CHOL, HDL, LDLCALC, TRIG, CHOLHDL, LDLDIRECT in the last 72 hours. Thyroid function studies No results for input(s): TSH, T4TOTAL, T3FREE, THYROIDAB in the last 72 hours.  Invalid input(s): FREET3 Anemia work up No results for input(s): VITAMINB12, FOLATE, FERRITIN, TIBC, IRON, RETICCTPCT in the last 72 hours. Microbiology Recent Results (from the past 240 hour(s))  Urine Culture     Status: Abnormal   Collection Time: 01/01/21  9:07 AM   Specimen: Urine, Clean Catch  Result Value Ref Range Status   Specimen Description   Final    URINE, CLEAN CATCH Performed at [facility], [address] [place], [facility] [address]    Special Requests   Final    NONE Performed at [facility], [address], [place], [facility] [address]    Culture (A)  Final    <10,000 COLONIES/mL INSIGNIFICANT GROWTH Performed at [facility], [address] [place], [facility] [address]     Report Status 01/02/2021 FINAL  Final  CULTURE, BLOOD (ROUTINE X 2) w Reflex to ID Panel     Status: None   Collection Time: 01/01/21  9:47 AM   Specimen: BLOOD  Result Value Ref Range Status   Specimen Description BLOOD LEFT ARM  Final   Special Requests   Final    BOTTLES DRAWN AEROBIC AND ANAEROBIC Blood Culture adequate volume   Culture   Final    NO GROWTH 5 DAYS Performed at [facility], [address], [place], [facility] [address]    Report Status 01/06/2021 FINAL  Final  CULTURE, BLOOD (ROUTINE X 2) w  Reflex to ID Panel     Status: None   Collection Time: 01/01/21  9:48 AM   Specimen: BLOOD  Result Value Ref Range Status   Specimen Description BLOOD  LEFT HAND  Final   Special Requests   Final    BOTTLES DRAWN AEROBIC AND ANAEROBIC Blood Culture adequate volume   Culture   Final    NO GROWTH 5 DAYS Performed at [facility], [address] [place], [place] [address]    Report Status 01/06/2021 FINAL  Final     Discharge Instructions:   Discharge Instructions    Diet - low sodium heart healthy   Complete by: As directed    Discharge instructions   Complete by: As directed    Avoid alcohol and tobacco products   Increase activity slowly   Complete by: As directed      Allergies as of 01/10/2021      Reactions   Oxycodone Rash   Rash/itching which required benadryl to resolve   Sudafed [pseudoephedrine]    History per pt of this being linked with his seizures      Medication List    STOP taking these medications   diphenhydrAMINE 25 mg capsule Commonly known as: BENADRYL   ibuprofen 200 MG tablet Commonly known as: ADVIL   oxyCODONE-acetaminophen 5-325 MG tablet Commonly known as: Percocet     TAKE these medications   ascorbic acid 500 MG tablet Commonly known as: VITAMIN C Take 1 tablet (500 mg total) by mouth 2 (two) times daily.   DULoxetine 20 MG capsule Commonly known as: CYMBALTA Take 1 capsule (20 mg total) by mouth  daily.   feeding supplement Liqd Take 237 mLs by mouth 2 (two) times daily between meals.   ferrous fumarate-b12-vitamic C-folic acid capsule Commonly known as: TRINSICON / FOLTRIN Take 1 capsule by mouth 2 (two) times daily after a meal.   gabapentin 100 MG capsule Commonly known as: NEURONTIN Take 200 mg by mouth in the morning and at bedtime.   lipase/protease/amylase [id number] UNITS Cpep capsule Commonly known as: CREON Take 1 capsule (36,000 Units total) by mouth 3 (three) times daily before meals.   metoprolol tartrate 50 MG tablet Commonly known as: LOPRESSOR Take 1 tablet (50 mg total) by mouth 2 (two) times daily.   multivitamin with minerals Tabs tablet Take 1 tablet by mouth daily.   multivitamin-lutein Caps capsule Take 1 capsule by mouth daily.   OLANZapine zydis 5 MG disintegrating tablet Commonly known as: ZYPREXA Take 1 tablet (5 mg total) by mouth at bedtime.   pantoprazole 40 MG tablet Commonly known as: PROTONIX Take 1 tablet (40 mg total) by mouth daily.   rivaroxaban 20 MG Tabs tablet Commonly known as: XARELTO Take 1 tablet (20 mg total) by mouth daily with supper.   rOPINIRole 0.5 MG tablet Commonly known as: REQUIP Take 0.5 mg by mouth at bedtime.   traZODone 50 MG tablet Commonly known as: DESYREL Take 50 mg by mouth at bedtime.   vitamin A 3 MG (10000 UNITS) capsule Take 1 capsule (10,000 Units total) by mouth daily.   Vitamin D (Ergocalciferol) 1.25 MG (50000 UNIT) Caps capsule Commonly known as: DRISDOL Take 1 capsule (50,000 Units total) by mouth every 7 (seven) days.   zinc sulfate 220 (50 Zn) MG capsule Take 1 capsule (220 mg total) by mouth daily.       Follow-up Information    [facility]. Schedule an appointment as soon as possible for  a visit in 3 week(s).   Contact information: [address] [place] [address] [phone]               Time coordinating discharge: 33  minutes  Signed:  Lurene Shadow  [facility] 01/10/2021, 4:46 PM   Pager on [email or web address]. If 7PM-7AM, please contact night-coverage at [email or web address]

## 2021-01-10 NOTE — Plan of Care (Signed)

## 2021-01-10 NOTE — Progress Notes (Signed)
Pt off unit, will reassess upon return

## 2021-01-10 NOTE — Progress Notes (Signed)
Pt received discharge instructions and all questions were answered. Medications sent to pharmacy of choice and pt instructed to follow up in 3 weeks with [facility].   Last set of VS:  BP: 111/75 Temp: 98.0 HR: 110 SpO2:  99% on RA  Provider notified of elevated HR prior to d/c. Provider stated pt is okay to discharge at this time.

## 2021-01-10 NOTE — Progress Notes (Signed)
Nutrition Follow-up  DOCUMENTATION CODES:   Non-severe (moderate) malnutrition in context of chronic illness  INTERVENTION:  Provide Ensure Enlive po BID, each supplement provides 350 kcal and 20 grams of protein.  Recommend discontinuing zinc supplementation as levels are WNL and continuation of supplementation could lead to copper deficiency.  Recommend: -MVI po daily -Continue vitamin D 50000 units every 7 days -If vitamin C and vitamin A levels come back WNL can discontinue these supplements as well  NUTRITION DIAGNOSIS:   Moderate Malnutrition related to chronic illness (hx EtOH use, chronic pancreatitis) as evidenced by mild fat depletion,mild muscle depletion,moderate muscle depletion.  Ongoing.  GOAL:   Patient will meet greater than or equal to 90% of their needs  Progressing.  MONITOR:   Diet advancement,Labs,Weight trends,TF tolerance,I & O's  REASON FOR ASSESSMENT:   NPO/Clear Liquid Diet    ASSESSMENT:   35 year old male with PMHx of EtOH use, hx chronic pancreatitis who is admitted with acute pancreatitis, presumed pancreatic ascites.  2/24 unsuccessful attempt at Dobbhoff tube placement; pt seen by ENt and found to have left sided septal deviation 2/25 s/p successful placement of 8 Fr. Dobbhoff tube in right nare with tip in jejunum 3/2 tube feeds stopped and diet advanced to soft  Met with patient at bedside. Tube feeds were stopped yesterday and Dobbhoff tube removed. Patient reports he is tolerating soft diet well. Last night for dinner he had meatloaf with broccoli and a small amount of rice (75% of meal). This morning he had a pancake and bacon (30% of meal). He denies any post-prandial pain or N/V. Patient has been started on Creon. Provided "Pancreatitis Nutrition Therapy" handout from the Academy of [employer] and reviewed with patient. Discussed foods that will likely be best tolerated in setting of pancreatitis. Encouraged adequate  intake of calories and protein. Patient enjoys drinking Ensure and has some at home. Encouraged intake between meals to help meet calorie/protein needs.  Medications reviewed and include: vitamin C 500 mg BID, Creon 36,000 units TID before meals, Protonix, vitamin A 10000 units daily, vitamin D 50000 units every 7 days, zinc sulfate 220 mg daily.  Labs reviewed: CBG 92-108, Creatinine 0.59. On 2/25 Iron 21, TIBC 104, Ferritin 732, Folate 15.2, Copper 96, Vitamin D 25-hydroxy 32.69, Zinc 62. Still pending vitamin A and vitamin C labs.   I/O: 2750 mL UOP yesterday (1.6 mL/kg/hr)  Weight trend: 72.9 kg on 3/3; stable from 73 kg on 2/24  Diet Order:   Diet Order            DIET SOFT Room service appropriate? Yes; Fluid consistency: Thin  Diet effective now                EDUCATION NEEDS:   No education needs have been identified at this time  Skin:  Skin Assessment: Reviewed RN Assessment  Last BM:  01/07/2021 per chart  Height:   Ht Readings from Last 1 Encounters:  12/29/20 '6\' 1"'  (1.854 m)   Weight:   Wt Readings from Last 1 Encounters:  01/10/21 72.9 kg   BMI:  Body mass index is 21.2 kg/m.  Estimated Nutritional Needs:   Kcal:  2100-2300  Protein:  105-115 grams  Fluid:  2.1-2.3 L/day  Jacklynn Barnacle, MS, RD, LDN Pager number available on Amion

## 2021-01-11 LAB — VITAMIN A: Vitamin A (Retinoic Acid): 7 ug/dL — ABNORMAL LOW (ref 18.9–57.3)

## 2021-12-02 ENCOUNTER — Encounter: Payer: Self-pay | Admitting: Emergency Medicine

## 2021-12-02 ENCOUNTER — Inpatient Hospital Stay
Admission: EM | Admit: 2021-12-02 | Discharge: 2021-12-05 | DRG: 438 | Disposition: A | Discharge status: Home / Self Care | Payer: Medicaid Other | Attending: Internal Medicine | Admitting: Internal Medicine

## 2021-12-02 ENCOUNTER — Other Ambulatory Visit: Payer: Self-pay

## 2021-12-02 DIAGNOSIS — R030 Elevated blood-pressure reading, without diagnosis of hypertension: Secondary | ICD-10-CM

## 2021-12-02 DIAGNOSIS — Z86718 Personal history of other venous thrombosis and embolism: Secondary | ICD-10-CM

## 2021-12-02 DIAGNOSIS — Z87891 Personal history of nicotine dependence: Secondary | ICD-10-CM

## 2021-12-02 DIAGNOSIS — E876 Hypokalemia: Secondary | ICD-10-CM | POA: Diagnosis present

## 2021-12-02 DIAGNOSIS — U071 COVID-19: Secondary | ICD-10-CM

## 2021-12-02 DIAGNOSIS — F32A Depression, unspecified: Secondary | ICD-10-CM | POA: Diagnosis present

## 2021-12-02 DIAGNOSIS — Z7901 Long term (current) use of anticoagulants: Secondary | ICD-10-CM

## 2021-12-02 DIAGNOSIS — K862 Cyst of pancreas: Secondary | ICD-10-CM | POA: Diagnosis present

## 2021-12-02 DIAGNOSIS — F1011 Alcohol abuse, in remission: Secondary | ICD-10-CM | POA: Diagnosis present

## 2021-12-02 DIAGNOSIS — K860 Alcohol-induced chronic pancreatitis: Secondary | ICD-10-CM | POA: Diagnosis present

## 2021-12-02 DIAGNOSIS — K859 Acute pancreatitis without necrosis or infection, unspecified: Principal | ICD-10-CM

## 2021-12-02 DIAGNOSIS — Z888 Allergy status to other drugs, medicaments and biological substances status: Secondary | ICD-10-CM

## 2021-12-02 DIAGNOSIS — F1091 Alcohol use, unspecified, in remission: Secondary | ICD-10-CM

## 2021-12-02 DIAGNOSIS — Z79899 Other long term (current) drug therapy: Secondary | ICD-10-CM

## 2021-12-02 DIAGNOSIS — K8500 Idiopathic acute pancreatitis without necrosis or infection: Secondary | ICD-10-CM

## 2021-12-02 DIAGNOSIS — Z885 Allergy status to narcotic agent status: Secondary | ICD-10-CM

## 2021-12-02 DIAGNOSIS — G2581 Restless legs syndrome: Secondary | ICD-10-CM | POA: Diagnosis present

## 2021-12-02 LAB — COMPREHENSIVE METABOLIC PANEL
ALT: 13 U/L (ref 0–44)
AST: 21 U/L (ref 15–41)
Albumin: 4.2 g/dL (ref 3.5–5.0)
Alkaline Phosphatase: 59 U/L (ref 38–126)
Anion gap: 10 (ref 5–15)
BUN: 10 mg/dL (ref 6–20)
CO2: 27 mmol/L (ref 22–32)
Calcium: 9.5 mg/dL (ref 8.9–10.3)
Chloride: 101 mmol/L (ref 98–111)
Creatinine, Ser: 0.98 mg/dL (ref 0.61–1.24)
GFR, Estimated: >60 mL/min (ref >60)
Glucose, Bld: 125 mg/dL — ABNORMAL HIGH (ref 70–99)
Potassium: 3.3 mmol/L — ABNORMAL LOW (ref 3.5–5.1)
Sodium: 138 mmol/L (ref 135–145)
Total Bilirubin: 0.7 mg/dL (ref 0.3–1.2)
Total Protein: 7.2 g/dL (ref 6.5–8.1)

## 2021-12-02 LAB — CBC
HCT: 43.9 % (ref 39.0–52.0)
Hemoglobin: 15.1 g/dL (ref 13.0–17.0)
MCH: 31.5 pg (ref 26.0–34.0)
MCHC: 34.4 g/dL (ref 30.0–36.0)
MCV: 91.6 fL (ref 80.0–100.0)
Platelets: 251 10*3/uL (ref 150–400)
RBC: 4.79 MIL/uL (ref 4.22–5.81)
RDW: 12.7 % (ref 11.5–15.5)
WBC: 10.5 10*3/uL (ref 4.0–10.5)
nRBC: 0 % (ref 0.0–0.2)

## 2021-12-02 MED ORDER — MORPHINE SULFATE (PF) 4 MG/ML IV SOLN
4.0000 mg | Freq: Once | INTRAVENOUS | Status: AC
Start: 2021-12-02 — End: 2021-12-02
  Administered 2021-12-02: 4 mg via INTRAVENOUS
  Filled 2021-12-02: qty 1

## 2021-12-02 MED ORDER — ONDANSETRON HCL 4 MG/2ML IJ SOLN
4.0000 mg | Freq: Once | INTRAMUSCULAR | Status: AC
  Administered 2021-12-02: 4 mg via INTRAVENOUS
  Filled 2021-12-02: qty 2

## 2021-12-02 NOTE — ED Triage Notes (Signed)
Pt to POV with c/o abd pain that wraps around to his back. He has had N/V three times this began today. Pain is so bad he is unable to stand up

## 2021-12-02 NOTE — ED Provider Notes (Signed)
[facility] Provider Note    Event Date/Time   First MD Initiated Contact with Patient 12/02/21 2257     (approximate)   History   Abdominal Pain   HPI  Shane Marn Sr. is a 36 y.o. male with history of alcohol abuse in remission who presents for evaluation of abdominal pain.  Patient reports that his symptoms started this morning.  He is complaining of sharp severe epigastric abdominal pain wrapping around into his back.  Has had nausea and 3 episodes of nonbloody nonbilious emesis.  No chest pain, no shortness of breath, no cough, no dysuria or hematuria, no diarrhea.  Patient has had chills as well.  He reports that his pain is severe and constant.  Patient reports that he has been sober for 11 months.     Past Medical History:  Diagnosis Date   Alcohol use disorder, severe, dependence (HCC)     History reviewed. No pertinent surgical history.   Physical Exam   Triage Vital Signs: ED Triage Vitals  Enc Vitals Group     BP 12/02/21 2247 (!) 136/108     Pulse Rate 12/02/21 2247 81     Resp 12/02/21 2247 18     Temp 12/02/21 2247 99.9 F (37.7 C)     Temp Source 12/02/21 2247 Oral     SpO2 12/02/21 2247 100 %     Weight --      Height 12/02/21 2248 6\' 1"  (1.854 m)     Head Circumference --      Peak Flow --      Pain Score 12/02/21 2248 10     Pain Loc --      Pain Edu? --      Excl. in GC? --     Most recent vital signs: Vitals:   12/03/21 0000 12/03/21 0100  BP: (!) 148/97 (!) 166/91  Pulse: 62 61  Resp: 17 18  Temp:    SpO2: 96% 98%     Constitutional: Alert and oriented.  Looks uncomfortable due to pain  HEENT:      Head: Normocephalic and atraumatic.         Eyes: Conjunctivae are normal. Sclera is non-icteric.       Mouth/Throat: Mucous membranes are moist.       Neck: Supple with no signs of meningismus. Cardiovascular: Regular rate and rhythm. No murmurs, gallops, or rubs. 2+ symmetrical distal pulses are present in  all extremities.  Respiratory: Normal respiratory effort. Lungs are clear to auscultation bilaterally.  Gastrointestinal: Soft, tender to palpation in the epigastric region with localized guarding, and non distended with positive bowel sounds. No rebound or guarding. Genitourinary: No CVA tenderness. Musculoskeletal:  No edema, cyanosis, or erythema of extremities. Neurologic: Normal speech and language. Face is symmetric. Moving all extremities. No gross focal neurologic deficits are appreciated. Skin: Skin is warm, dry and intact. No rash noted. Psychiatric: Mood and affect are normal. Speech and behavior are normal.  ED Results / Procedures / Treatments   Labs (all labs ordered are listed, but only abnormal results are displayed) Labs Reviewed  LIPASE, BLOOD - Abnormal; Notable for the following components:      Result Value   Lipase 3,420 (*)    All other components within normal limits  COMPREHENSIVE METABOLIC PANEL - Abnormal; Notable for the following components:   Potassium 3.3 (*)    Glucose, Bld 125 (*)    All other components within normal limits  URINE DRUG SCREEN, QUALITATIVE ([facility] ONLY) - Abnormal; Notable for the following components:   Opiate, Ur Screen POSITIVE (*)    All other components within normal limits  URINALYSIS, COMPLETE (UACMP) WITH MICROSCOPIC - Abnormal; Notable for the following components:   Ketones, ur 40 (*)    Protein, ur TRACE (*)    All other components within normal limits  RESP PANEL BY RT-PCR (FLU A&B, COVID) ARPGX2  CBC  ETHANOL     EKG  none   RADIOLOGY I, Rudene Re, attending MD, have personally viewed and interpreted the images obtained during this visit as below:  CT showing inflammatory around the pancreas   ___________________________________________________ Interpretation by Radiologist:  CT ABDOMEN PELVIS W CONTRAST  Result Date: 12/03/2021 CLINICAL DATA:  Epigastric abdominal pain radiating to back. EXAM: CT  ABDOMEN AND PELVIS WITH CONTRAST TECHNIQUE: Multidetector CT imaging of the abdomen and pelvis was performed using the standard protocol following bolus administration of intravenous contrast. RADIATION DOSE REDUCTION: This exam was performed according to the departmental dose-optimization program which includes automated exposure control, adjustment of the mA and/or kV according to patient size and/or use of iterative reconstruction technique. CONTRAST:  152mL OMNIPAQUE IOHEXOL 300 MG/ML  SOLN COMPARISON:  01/10/2021 FINDINGS: Lower chest: No acute abnormality. Hepatobiliary: No focal hepatic abnormality. Gallbladder unremarkable. Pancreas: Diffuse edema/inflammation around the pancreas compatible with acute pancreatitis. Focal fluid collection in the pancreatic head/uncinate process measuring 2.1 cm. This fluid collection previously measured 2.9 cm. Second anterior pancreatic head cyst has resolved. Spleen: No focal abnormality.  Normal size. Adrenals/Urinary Tract: No adrenal abnormality. No focal renal abnormality. No stones or hydronephrosis. Urinary bladder is unremarkable. Stomach/Bowel: Stomach, large and small bowel grossly unremarkable. Vascular/Lymphatic: No evidence of aneurysm or adenopathy. Reproductive: No visible focal abnormality. Other: No free fluid or free air. Musculoskeletal: No acute bony abnormality. IMPRESSION: Edema/stranding around the pancreas compatible with acute pancreatitis. Fluid collection/cyst in the uncinate process is decreased in size since prior study. Pancreatic head cyst on prior study has resolved. Electronically Signed   By: Rolm Baptise M.D.   On: 12/03/2021 01:22      PROCEDURES:  Critical Care performed: No  Procedures    IMPRESSION / MDM / ASSESSMENT AND PLAN / ED COURSE  I reviewed the triage vital signs and the nursing notes.  36 y.o. male with history of alcohol abuse in remission who presents for evaluation of severe constant epigastric abdominal pain  wrapping around to the back associated with nausea and NBNB emesis since this am.  Patient looks uncomfortable due to pain, abdomen is soft with diffuse tenderness and guarding in the epigastric region.  Ddx: Pancreatitis versus peptic ulcer disease versus gastritis versus kidney stone versus pyelonephritis versus gallbladder pathology versus SBO   Plan: CBC, CMP, lipase, urinalysis, UDS, ethanol level.  We will place patient on telemetry for close monitoring of hemodynamics.  Will give IV morphine and Zofran for symptom relief   MEDICATIONS GIVEN IN ED: Medications  lactated ringers infusion (has no administration in time range)  morphine 4 MG/ML injection 4 mg (4 mg Intravenous Given 12/02/21 2316)  ondansetron (ZOFRAN) injection 4 mg (4 mg Intravenous Given 12/02/21 2315)  iohexol (OMNIPAQUE) 300 MG/ML solution 100 mL (100 mLs Intravenous Contrast Given 12/03/21 0110)  lactated ringers bolus 1,000 mL (1,000 mLs Intravenous New Bag/Given 12/03/21 0158)  morphine 4 MG/ML injection 4 mg (4 mg Intravenous Given 12/03/21 0155)     ED COURSE: CT consistent with acute pancreatitis.  Labs showing  lipase of 3420, no leukocytosis, no DKA or anion gap.  Alcohol level negative.  Patient received a dose of IV morphine and still complaining of severe pain.  We will give a second dose and initiate IV hydration.  Due to the severity of the pain and severe inflammation of the pancreas will consult the hospitalist for admission.  After discussing with the hospitalist patient was accepted to their service for admission   Consults: Hospitalist   EMR reviewed including records from patient's last admission due to pancreatitis from February 2020       FINAL CLINICAL IMPRESSION(S) / ED DIAGNOSES   Final diagnoses:  Idiopathic acute pancreatitis without infection or necrosis     Rx / DC Orders   ED Discharge Orders     None        Note:  This document was prepared using Dragon voice recognition  software and may include unintentional dictation errors.   Alfred Levins, [place], MD 12/03/21 [phone]

## 2021-12-03 ENCOUNTER — Emergency Department: Payer: Medicaid Other

## 2021-12-03 ENCOUNTER — Encounter: Payer: Self-pay | Admitting: Internal Medicine

## 2021-12-03 DIAGNOSIS — F1011 Alcohol abuse, in remission: Secondary | ICD-10-CM | POA: Diagnosis present

## 2021-12-03 DIAGNOSIS — Z885 Allergy status to narcotic agent status: Secondary | ICD-10-CM | POA: Diagnosis not present

## 2021-12-03 DIAGNOSIS — Z7901 Long term (current) use of anticoagulants: Secondary | ICD-10-CM | POA: Diagnosis not present

## 2021-12-03 DIAGNOSIS — U071 COVID-19: Secondary | ICD-10-CM

## 2021-12-03 DIAGNOSIS — Z87891 Personal history of nicotine dependence: Secondary | ICD-10-CM | POA: Diagnosis not present

## 2021-12-03 DIAGNOSIS — Z888 Allergy status to other drugs, medicaments and biological substances status: Secondary | ICD-10-CM | POA: Diagnosis not present

## 2021-12-03 DIAGNOSIS — F1091 Alcohol use, unspecified, in remission: Secondary | ICD-10-CM

## 2021-12-03 DIAGNOSIS — Z79899 Other long term (current) drug therapy: Secondary | ICD-10-CM | POA: Diagnosis not present

## 2021-12-03 DIAGNOSIS — Z86718 Personal history of other venous thrombosis and embolism: Secondary | ICD-10-CM | POA: Diagnosis not present

## 2021-12-03 DIAGNOSIS — K860 Alcohol-induced chronic pancreatitis: Secondary | ICD-10-CM | POA: Diagnosis present

## 2021-12-03 DIAGNOSIS — R030 Elevated blood-pressure reading, without diagnosis of hypertension: Secondary | ICD-10-CM

## 2021-12-03 DIAGNOSIS — E876 Hypokalemia: Secondary | ICD-10-CM | POA: Diagnosis present

## 2021-12-03 DIAGNOSIS — F1020 Alcohol dependence, uncomplicated: Secondary | ICD-10-CM | POA: Insufficient documentation

## 2021-12-03 DIAGNOSIS — F32A Depression, unspecified: Secondary | ICD-10-CM | POA: Diagnosis present

## 2021-12-03 DIAGNOSIS — K862 Cyst of pancreas: Secondary | ICD-10-CM

## 2021-12-03 DIAGNOSIS — K859 Acute pancreatitis without necrosis or infection, unspecified: Principal | ICD-10-CM

## 2021-12-03 DIAGNOSIS — G2581 Restless legs syndrome: Secondary | ICD-10-CM | POA: Diagnosis present

## 2021-12-03 LAB — URINALYSIS, COMPLETE (UACMP) WITH MICROSCOPIC
Bacteria, UA: NONE SEEN
Bilirubin Urine: NEGATIVE
Glucose, UA: NEGATIVE mg/dL
Hgb urine dipstick: NEGATIVE
Ketones, ur: 40 mg/dL — AB
Leukocytes,Ua: NEGATIVE
Nitrite: NEGATIVE
Specific Gravity, Urine: 1.015 (ref 1.005–1.030)
Squamous Epithelial / HPF: NONE SEEN (ref 0–5)
pH: 7.5 (ref 5.0–8.0)

## 2021-12-03 LAB — URINE DRUG SCREEN, QUALITATIVE (ARMC ONLY)
Amphetamines, Ur Screen: NOT DETECTED
Barbiturates, Ur Screen: NOT DETECTED
Benzodiazepine, Ur Scrn: NOT DETECTED
Cannabinoid 50 Ng, Ur ~~LOC~~: NOT DETECTED
Cocaine Metabolite,Ur ~~LOC~~: NOT DETECTED
MDMA (Ecstasy)Ur Screen: NOT DETECTED
Methadone Scn, Ur: NOT DETECTED
Opiate, Ur Screen: POSITIVE — AB
Phencyclidine (PCP) Ur S: NOT DETECTED
Tricyclic, Ur Screen: NOT DETECTED

## 2021-12-03 LAB — RESP PANEL BY RT-PCR (FLU A&B, COVID) ARPGX2
Influenza A by PCR: NEGATIVE
Influenza B by PCR: NEGATIVE
SARS Coronavirus 2 by RT PCR: POSITIVE — AB

## 2021-12-03 LAB — ETHANOL: Alcohol, Ethyl (B): <10 mg/dL (ref <10)

## 2021-12-03 LAB — LIPASE, BLOOD: Lipase: 3420 U/L — ABNORMAL HIGH (ref 11–51)

## 2021-12-03 IMAGING — CT CT ABD-PELV W/ CM
2 of 4 series · 15 of 46 positions shown, 17 images · IV contrast (agent unspecified)
Comparison: [DATE]

CLINICAL DATA: Epigastric abdominal pain radiating to back.

EXAM:
CT ABDOMEN AND PELVIS WITH CONTRAST
TECHNIQUE: Multidetector CT imaging of the abdomen and pelvis was performed
using the standard protocol following bolus administration of
intravenous contrast.

[Series 2: routine abd/pel with · axial · 0.79mm/px · z∈[-612,-148]mm · 12 of 103 slices shown, 14 images]
[im 5/103  soft-tissue]
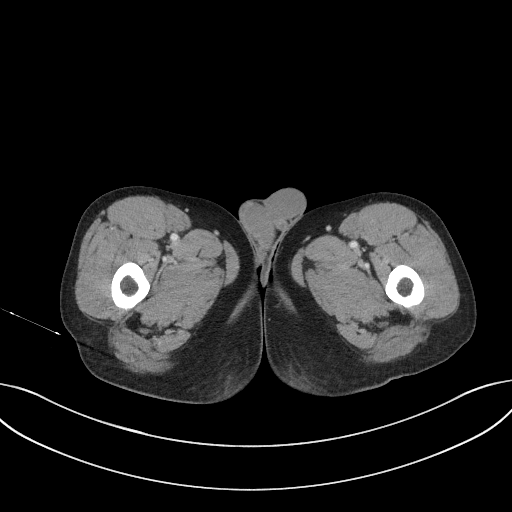
[im 5/103  bone]
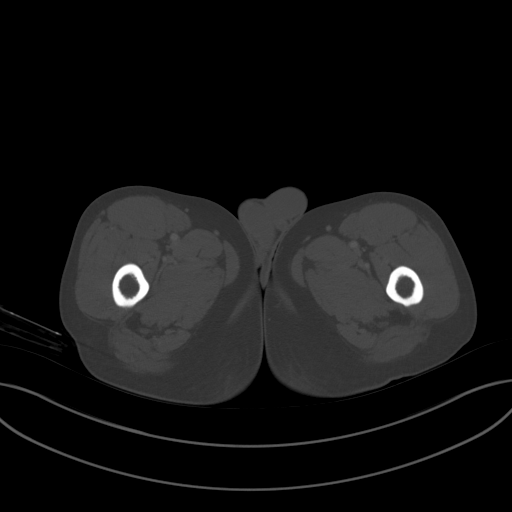
[im 13/103  soft-tissue]
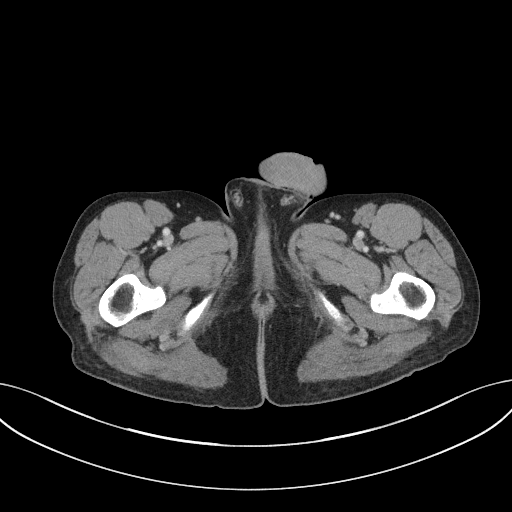
[im 22/103  soft-tissue]
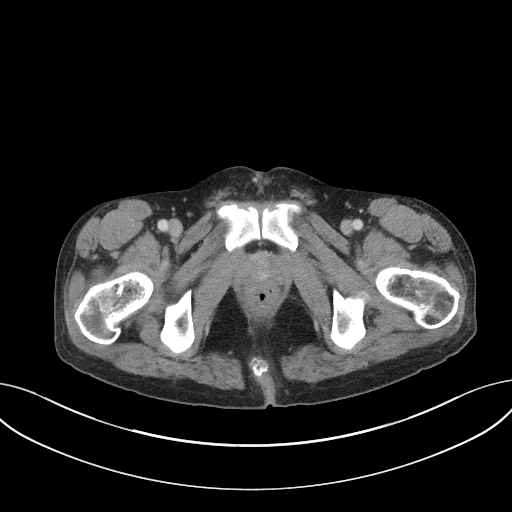
[im 30/103  soft-tissue]
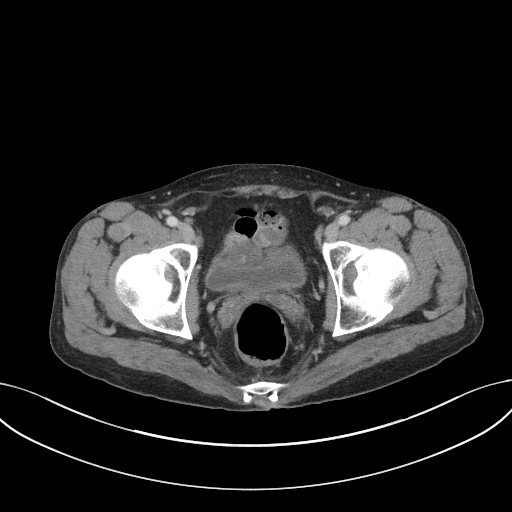
[im 39/103  soft-tissue]
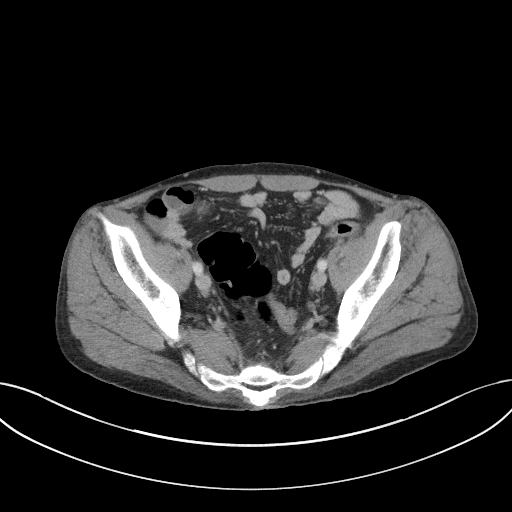
[im 47/103  soft-tissue]
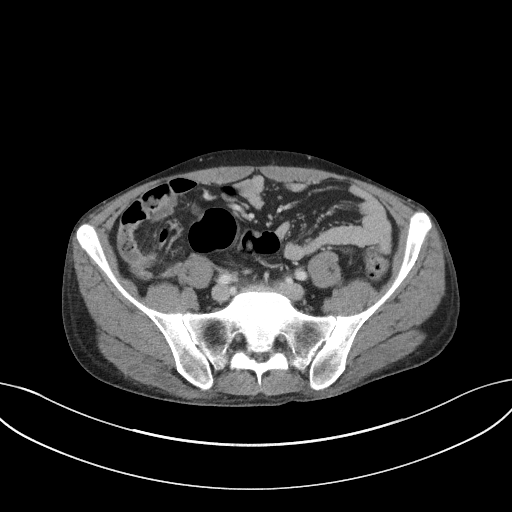
[im 56/103  soft-tissue]
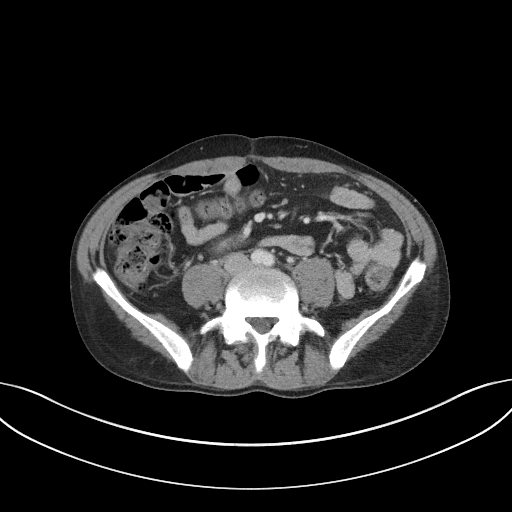
[im 64/103  soft-tissue]
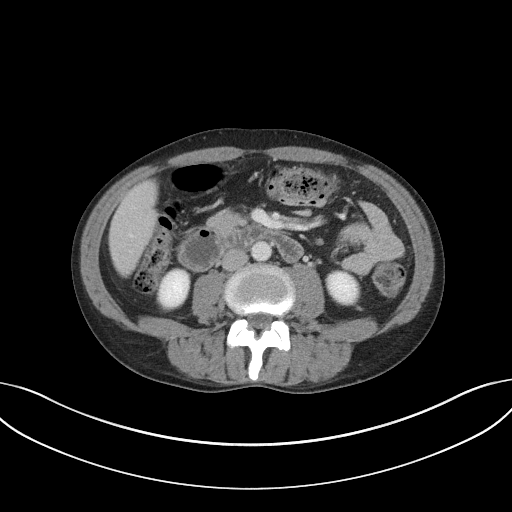
[im 73/103  soft-tissue]
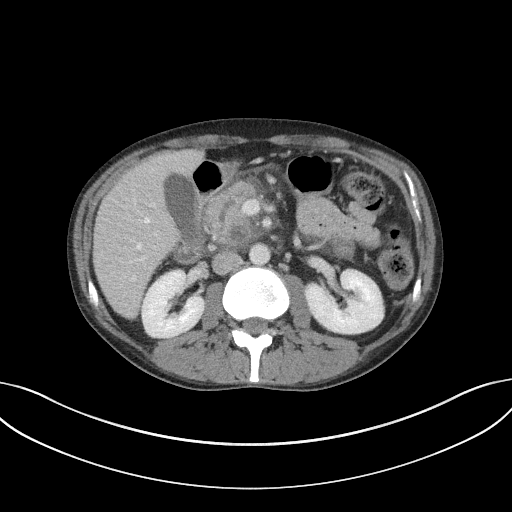
[im 73/103  bone]
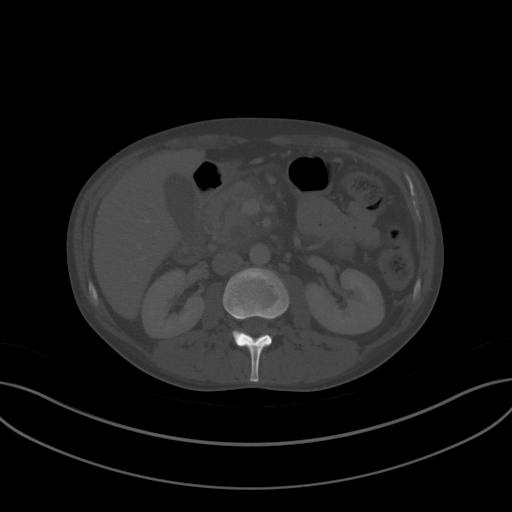
[im 81/103  soft-tissue]
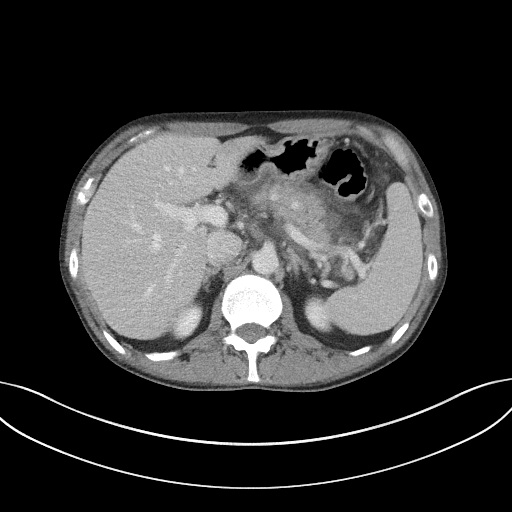
[im 90/103  soft-tissue]
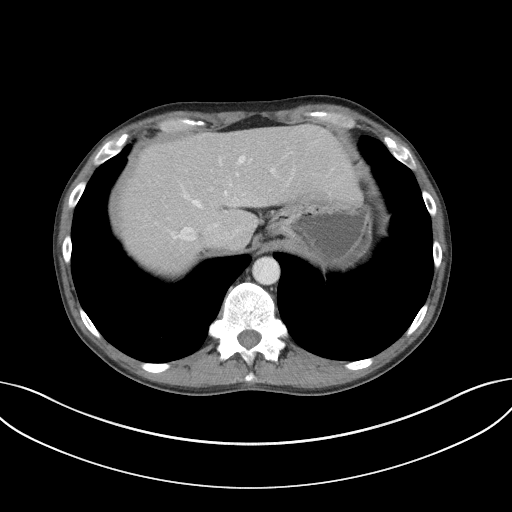
[im 98/103  soft-tissue]
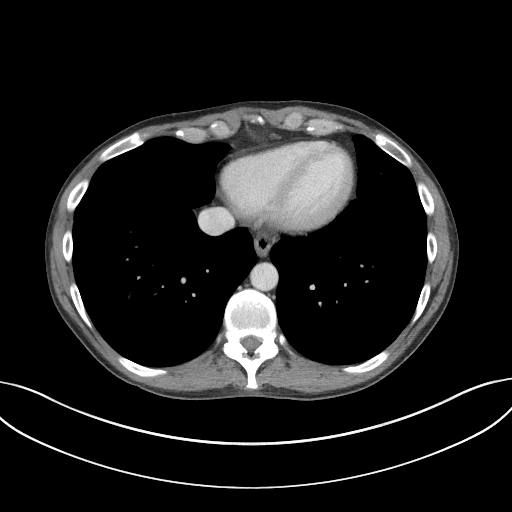

[Series 5: coronal st · coronal · 0.68mm/px · 3 of 80 slices shown]
[im 27/80  soft-tissue]
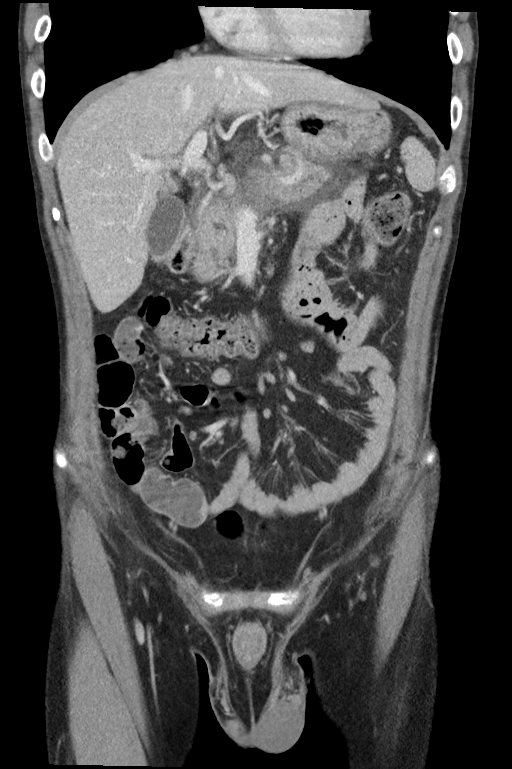
[im 36/80  soft-tissue]
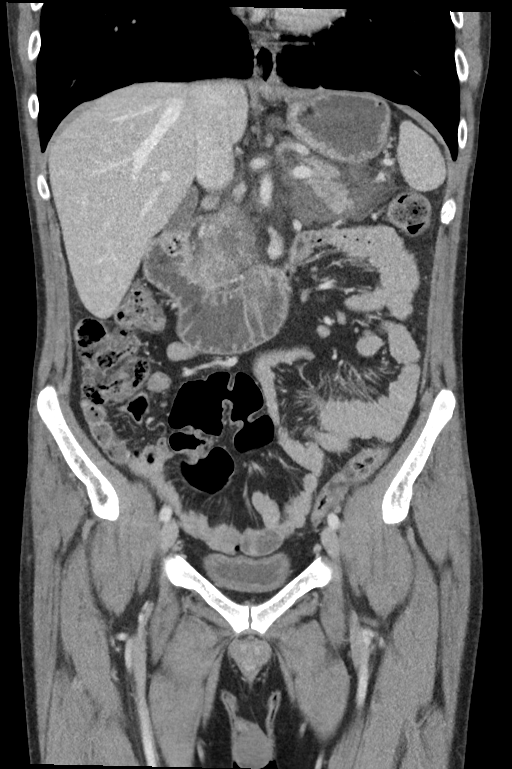
[im 44/80  soft-tissue]
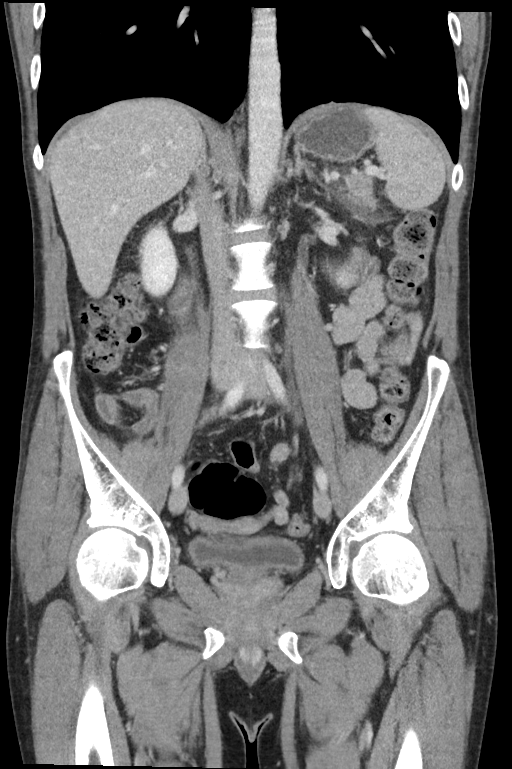

[15 of 46 positions shown; findings below may reference images not displayed]

RADIATION DOSE REDUCTION: This exam was performed according to the
departmental dose-optimization program which includes automated
exposure control, adjustment of the mA and/or kV according to
patient size and/or use of iterative reconstruction technique.

CONTRAST:  100mL OMNIPAQUE IOHEXOL 300 MG/ML  SOLN
FINDINGS: Lower chest: No acute abnormality.

Hepatobiliary: No focal hepatic abnormality. Gallbladder
unremarkable.

Pancreas: Diffuse edema/inflammation around the pancreas compatible
with acute pancreatitis. Focal fluid collection in the pancreatic
head/uncinate process measuring 2.1 cm. This fluid collection
previously measured 2.9 cm. Second anterior pancreatic head cyst has
resolved.

Spleen: No focal abnormality.  Normal size.

Adrenals/Urinary Tract: No adrenal abnormality. No focal renal
abnormality. No stones or hydronephrosis. Urinary bladder is
unremarkable.

Stomach/Bowel: Stomach, large and small bowel grossly unremarkable.

Vascular/Lymphatic: No evidence of aneurysm or adenopathy.

Reproductive: No visible focal abnormality.

Other: No free fluid or free air.

Musculoskeletal: No acute bony abnormality.
IMPRESSION: Edema/stranding around the pancreas compatible with acute
pancreatitis.

Fluid collection/cyst in the uncinate process is decreased in size
since prior study. Pancreatic head cyst on prior study has resolved.

## 2021-12-03 MED ORDER — IOHEXOL 300 MG/ML  SOLN
100.0000 mL | Freq: Once | INTRAMUSCULAR | Status: AC | PRN
Start: 2021-12-03 — End: 2021-12-03
  Administered 2021-12-03: 01:00:00 100 mL via INTRAVENOUS

## 2021-12-03 MED ORDER — POTASSIUM CHLORIDE IN NACL 40-0.9 MEQ/L-% IV SOLN
INTRAVENOUS | Status: DC
Start: 2021-12-03 — End: 2021-12-05
  Filled 2021-12-03 (×8): qty 1000

## 2021-12-03 MED ORDER — LACTATED RINGERS IV SOLN: INTRAVENOUS | Status: DC

## 2021-12-03 MED ORDER — ACETAMINOPHEN 650 MG RE SUPP: 650.0000 mg | Freq: Four times a day (QID) | RECTAL | Status: DC | PRN

## 2021-12-03 MED ORDER — LACTATED RINGERS IV BOLUS
1000.0000 mL | Freq: Once | INTRAVENOUS | Status: AC
  Administered 2021-12-03: 02:00:00 1000 mL via INTRAVENOUS

## 2021-12-03 MED ORDER — RIVAROXABAN 20 MG PO TABS
20.0000 mg | ORAL_TABLET | Freq: Every day | ORAL | Status: DC
  Administered 2021-12-03 – 2021-12-05 (×3): 20 mg via ORAL
  Filled 2021-12-03 (×4): qty 1

## 2021-12-03 MED ORDER — ONDANSETRON HCL 4 MG PO TABS: 4.0000 mg | ORAL_TABLET | Freq: Four times a day (QID) | ORAL | Status: DC | PRN

## 2021-12-03 MED ORDER — MORPHINE SULFATE (PF) 2 MG/ML IV SOLN
2.0000 mg | INTRAVENOUS | Status: DC | PRN
  Administered 2021-12-03 (×5): 2 mg via INTRAVENOUS
  Filled 2021-12-03 (×5): qty 1

## 2021-12-03 MED ORDER — ONDANSETRON HCL 4 MG/2ML IJ SOLN: 4.0000 mg | Freq: Four times a day (QID) | INTRAMUSCULAR | Status: DC | PRN

## 2021-12-03 MED ORDER — MORPHINE SULFATE (PF) 4 MG/ML IV SOLN
4.0000 mg | Freq: Once | INTRAVENOUS | Status: AC
  Administered 2021-12-03: 02:00:00 4 mg via INTRAVENOUS
  Filled 2021-12-03: qty 1

## 2021-12-03 MED ORDER — ACETAMINOPHEN 325 MG PO TABS: 650.0000 mg | ORAL_TABLET | Freq: Four times a day (QID) | ORAL | Status: DC | PRN

## 2021-12-03 NOTE — ED Notes (Signed)
Could not draw labs because the ED doesn't have any gold tubes

## 2021-12-03 NOTE — Progress Notes (Incomplete)
°  INTERVAL PROGRESS NOTE    [name]- 36 y.o. male  LOS: 0 __________________________________________________________________  SUBJECTIVE: Admitted 12/02/2021 with cc of  Chief Complaint  Patient presents with   Abdominal Pain   Since admission, ***  OBJECTIVE: Blood pressure 135/90, pulse 72, temperature 99.9 F (37.7 C), temperature source Oral, resp. rate 17, height 6\' 1"  (1.854 m), SpO2 95 %.  General: NAD, pleasant, able to participate in exam Cardiac: RRR, normal heart sounds, no murmurs. 2+ radial and PT pulses bilaterally Respiratory: CTAB, normal effort, No wheezes, rales or rhonchi Abdomen: soft, nontender, nondistended, no hepatic or splenomegaly, +BS Extremities: no edema. WWP. Skin: warm and dry, no rashes noted Neuro: alert and oriented, no focal deficits Psych: Normal affect and mood   ASSESSMENT/PLAN:  I have reviewed the full H&P by Dr. ***, and I agree with the assessment and plan as outlined therein. In addition: ***   Active Problems:   Hypokalemia   Acute pancreatitis   Pancreatic cyst   RLS (restless legs syndrome)   History of DVT (deep vein thrombosis)   Chronic anticoagulation   Alcohol use disorder in remission   Elevated blood-pressure reading, without diagnosis of hypertension   COVID-19 virus infection      , DO [facility] 12/03/2021, 7:39 AM    [email or web address] Available by Epic secure chat 7AM-7PM. If 7PM-7AM, please contact night-coverage   ***No Charge

## 2021-12-03 NOTE — H&P (Signed)
History and Physical    Shane Jenkins [id number] DOB: 02-07-1986 DOA: 12/02/2021  PCP: Lorelee Market, MD   Patient coming from: home  I have personally briefly reviewed patient's relevant medical records in [place]  Chief Complaint: abdominal pain  HPI: Thorson Moncrieff Sr. is a 36 y.o. 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  He had 3 episodes of nonbloody nonbilious emesis.  He has chills but no fever.  Reports that he has been abstinent from alcohol since his last hospitalization 11 months ago  ED course: On arrival BP 136/108 166/103, with low-grade temperature of 99.9 and otherwise normal vitals Blood work: Lipase 3420 with normal LFTs and EtOH less than 10 CBC and CMP unremarkable except for mild hypokalemia of 3.3 UDS positive for opiates  Imaging: CT abdomen and pelvis with contrast showing edema/stranding around the pancreas compatible with acute pancreatitis, fluid collection/cyst in the uncinate process decreased in size from prior study.  Pancreatic head cyst on prior study resolved  Patient treated with multiple rounds of IV pain meds, IV fluid boluses and IV antiemetics.  Hospitalist consulted for admission due to persistent intractable pain.  Review of Systems: As per HPI otherwise all other systems on review of systems negative.   Assessment/Plan    Acute  pancreatitis   Pancreatic cyst   Alcohol use disorder in remission x11 months - IV hydration, IV pain meds, IV antiemetics - Clear liquids as tolerated - Monitor lipase    Hypokalemia - IV repletion  Elevated BP without history of HTN - BP up to 166/103, likely related to pain - Continue to monitor for possible HTN diagnosis after pain control  achieved  COVID-19 infection - Suspect incidental and not symptomatic - COVID Airborne precautions    RLS (restless legs syndrome) - Resume ropinirole once patient tolerating orally  Chronic anticoagulation due to history of DVT  -Continue Xarelto    DVT prophylaxis: Xarelto Code Status: full code  Family Communication:  none  Disposition Plan: Back to previous home environment Consults called: none  Status:At the time of admission, it appears that the appropriate admission status for this patient is INPATIENT. This is judged to be reasonable and necessary in order to provide the required intensity of service to ensure the patient's safety given the presenting symptoms, physical exam findings, and initial radiographic and laboratory data in the context of their  Comorbid conditions.   Patient requires inpatient status due to high intensity of service, high risk for further deterioration and high frequency of surveillance required.   I certify that at the point of admission it is my clinical judgment that the patient will require inpatient hospital care spanning beyond 2 midnights     Physical Exam: Vitals:   12/03/21 0000 12/03/21 0100 12/03/21 0230 12/03/21 0300  BP: (!) 148/97 (!) 166/91 (!) 167/90 (!) 166/103  Pulse: 62 61 67 76  Resp: 17 18 17 17   Temp:      TempSrc:      SpO2: 96% 98% 96% 100%  Height:       Constitutional: Alert, oriented x 3 . Not in any apparent distress HEENT:      Head: Normocephalic and atraumatic.         Eyes: PERLA, EOMI, Conjunctivae are normal. Sclera is non-icteric.  Mouth/Throat: Mucous membranes are moist.       Neck: Supple with no signs of meningismus. Cardiovascular: Regular rate and rhythm. No murmurs, gallops, or rubs. 2+ symmetrical distal pulses are present . No JVD. No  LE edema Respiratory: Respiratory effort normal .Lungs sounds clear bilaterally. No wheezes, crackles, or rhonchi.  Gastrointestinal: Soft, epigastric pain,  non distended. Positive bowel sounds.  Genitourinary: No CVA tenderness. Musculoskeletal: Nontender with normal range of motion in all extremities. No cyanosis, or erythema of extremities. Neurologic:  Face is symmetric. Moving all extremities. No gross focal neurologic deficits . Skin: Skin is warm, dry.  No rash or ulcers Psychiatric: Mood and affect are appropriate     Past Medical History:  Diagnosis Date   Alcohol use disorder, severe, dependence ([place])     History reviewed. No pertinent surgical history.   reports that he has been smoking. He has never used smokeless tobacco. He reports that he does not currently use alcohol. He reports that he does not currently use drugs.  Allergies  Allergen Reactions   Oxycodone Rash    Rash/itching which required benadryl to resolve   Sudafed [Pseudoephedrine]     History per pt of this being linked with his seizures    Family History  Problem Relation Age of Onset   Alcohol abuse Mother       Prior to Admission medications   Medication Sig Start Date End Date Taking? Authorizing Provider  ascorbic acid (VITAMIN C) 500 MG tablet Take 1 tablet (500 mg total) by mouth 2 (two) times daily. 05/31/20   Lorella Nimrod, MD  DULoxetine (CYMBALTA) 20 MG capsule Take 1 capsule (20 mg total) by mouth daily. 06/01/20   Lorella Nimrod, MD  feeding supplement, ENSURE ENLIVE, (ENSURE ENLIVE) LIQD Take 237 mLs by mouth 2 (two) times daily between meals. 05/31/20   Lorella Nimrod, MD  ferrous [id number] C-folic acid (TRINSICON / FOLTRIN) capsule Take 1 capsule by mouth 2 (two) times daily after a meal. 05/31/20   Lorella Nimrod, MD  gabapentin (NEURONTIN) 100 MG capsule Take 200 mg by mouth in the morning and at bedtime. 12/25/20   [provider]  metoprolol tartrate (LOPRESSOR) 50 MG tablet Take 1 tablet (50 mg total) by mouth 2 (two) times daily. 05/31/20   Lorella Nimrod, MD  Multiple Vitamin (MULTIVITAMIN WITH MINERALS) TABS tablet Take  1 tablet by mouth daily. 06/01/20   Lorella Nimrod, MD  multivitamin-lutein [facility]) CAPS capsule Take 1 capsule by mouth daily. 05/31/20   Lorella Nimrod, MD  OLANZapine zydis (ZYPREXA) 5 MG disintegrating tablet Take 1 tablet (5 mg total) by mouth at bedtime. 05/31/20   Lorella Nimrod, MD  pantoprazole (PROTONIX) 40 MG tablet Take 1 tablet (40 mg total) by mouth daily. 06/01/20   Lorella Nimrod, MD  rivaroxaban (XARELTO) 20 MG TABS tablet Take 1 tablet (20 mg total) by mouth daily with supper. 05/31/20   Lorella Nimrod, MD  rOPINIRole (REQUIP) 0.5 MG tablet Take 0.5 mg by mouth at bedtime. 12/04/20   [provider]  traZODone (DESYREL) 50 MG tablet Take 50 mg by mouth at bedtime. 10/15/20   [provider]  vitamin A 3 MG (10000 UNITS) capsule Take 1 capsule (10,000 Units total) by mouth daily. 05/31/20   Lorella Nimrod, MD  Vitamin D, Ergocalciferol, (DRISDOL) 1.25 MG (50000 UNIT) CAPS capsule Take 1 capsule (50,000 Units total) by mouth every 7 (seven) days. 06/06/20   Lorella Nimrod, MD  zinc sulfate 220 (50 Zn) MG  capsule Take 1 capsule (220 mg total) by mouth daily. 05/31/20   Arnetha Courser, MD      Labs on Admission: I have personally reviewed following labs and imaging studies  CBC: Recent Labs  Lab 12/02/21 2258  WBC 10.5  HGB 15.1  HCT 43.9  MCV 91.6  PLT 251   Basic Metabolic Panel: Recent Labs  Lab 12/02/21 2258  NA 138  K 3.3*  CL 101  CO2 27  GLUCOSE 125*  BUN 10  CREATININE 0.98  CALCIUM 9.5   GFR: CrCl cannot be calculated (Unknown ideal weight.). Liver Function Tests: Recent Labs  Lab 12/02/21 2258  AST 21  ALT 13  ALKPHOS 59  BILITOT 0.7  PROT 7.2  ALBUMIN 4.2   Recent Labs  Lab 12/02/21 2258  LIPASE 3,420*   No results for input(s): AMMONIA in the last 168 hours. Coagulation Profile: No results for input(s): INR, PROTIME in the last 168 hours. Cardiac Enzymes: No results for input(s): CKTOTAL, CKMB, CKMBINDEX, TROPONINI in the  last 168 hours. BNP (last 3 results) No results for input(s): PROBNP in the last 8760 hours. HbA1C: No results for input(s): HGBA1C in the last 72 hours. CBG: No results for input(s): GLUCAP in the last 168 hours. Lipid Profile: No results for input(s): CHOL, HDL, LDLCALC, TRIG, CHOLHDL, LDLDIRECT in the last 72 hours. Thyroid Function Tests: No results for input(s): TSH, T4TOTAL, FREET4, T3FREE, THYROIDAB in the last 72 hours. Anemia Panel: No results for input(s): VITAMINB12, FOLATE, FERRITIN, TIBC, IRON, RETICCTPCT in the last 72 hours. Urine analysis:    Component Value Date/Time   COLORURINE YELLOW 12/03/2021 0003   APPEARANCEUR CLEAR 12/03/2021 0003   LABSPEC 1.015 12/03/2021 0003   PHURINE 7.5 12/03/2021 0003   GLUCOSEU NEGATIVE 12/03/2021 0003   HGBUR NEGATIVE 12/03/2021 0003   BILIRUBINUR NEGATIVE 12/03/2021 0003   KETONESUR 40 (A) 12/03/2021 0003   PROTEINUR TRACE (A) 12/03/2021 0003   NITRITE NEGATIVE 12/03/2021 0003   LEUKOCYTESUR NEGATIVE 12/03/2021 0003    Radiological Exams on Admission: CT ABDOMEN PELVIS W CONTRAST  Result Date: 12/03/2021 CLINICAL DATA:  Epigastric abdominal pain radiating to back. EXAM: CT ABDOMEN AND PELVIS WITH CONTRAST TECHNIQUE: Multidetector CT imaging of the abdomen and pelvis was performed using the standard protocol following bolus administration of intravenous contrast. RADIATION DOSE REDUCTION: This exam was performed according to the departmental dose-optimization program which includes automated exposure control, adjustment of the mA and/or kV according to patient size and/or use of iterative reconstruction technique. CONTRAST:  OMNIPAQUE IOHEXOL 300 MG/ML  SOLN COMPARISON:  01/10/2021 FINDINGS: Lower chest: No acute abnormality. Hepatobiliary: No focal hepatic abnormality. Gallbladder unremarkable. Pancreas: Diffuse edema/inflammation around the pancreas compatible with acute pancreatitis. Focal fluid collection in the pancreatic  head/uncinate process measuring 2.1 cm. This fluid collection previously measured 2.9 cm. Second anterior pancreatic head cyst has resolved. Spleen: No focal abnormality.  Normal size. Adrenals/Urinary Tract: No adrenal abnormality. No focal renal abnormality. No stones or hydronephrosis. Urinary bladder is unremarkable. Stomach/Bowel: Stomach, large and small bowel grossly unremarkable. Vascular/Lymphatic: No evidence of aneurysm or adenopathy. Reproductive: No visible focal abnormality. Other: No free fluid or free air. Musculoskeletal: No acute bony abnormality. IMPRESSION: Edema/stranding around the pancreas compatible with acute pancreatitis. Fluid collection/cyst in the uncinate process is decreased in size since prior study. Pancreatic head cyst on prior study has resolved. Electronically Signed   By: Charlett Nose M.D.   On: 12/03/2021 01:22       Andris Baumann  MD [facility]   12/03/2021, 3:23 AM

## 2021-12-03 NOTE — ED Notes (Addendum)
Bladder Scan 80mL.

## 2021-12-03 NOTE — ED Notes (Addendum)
Pt given PRN pain medications for increased pain, 8/10. Given more warm blankets, call light within reach  Liquid diet tray at bedside.

## 2021-12-03 NOTE — Clinical Social Work Note (Signed)
°  Transition of Care [facility]) Screening Note   Patient Details  Name: Shane Jenkins. Date of Birth: 28-Jul-1986   Transition of Care [facility]) CM/SW Contact:    Eileen Stanford, LCSW Phone [place] 12/03/2021, 3:19 PM    Transition of Care Department [facility]) has reviewed patient and no TOC needs have been identified at this time. We will continue to monitor patient advancement through interdisciplinary progression rounds. If new patient transition needs arise, please place a TOC consult.

## 2021-12-04 DIAGNOSIS — Z86718 Personal history of other venous thrombosis and embolism: Secondary | ICD-10-CM

## 2021-12-04 LAB — HIV ANTIBODY (ROUTINE TESTING W REFLEX): HIV Screen 4th Generation wRfx: NONREACTIVE

## 2021-12-04 MED ORDER — POTASSIUM CHLORIDE CRYS ER 20 MEQ PO TBCR
40.0000 meq | EXTENDED_RELEASE_TABLET | Freq: Once | ORAL | Status: AC
  Administered 2021-12-04: 10:00:00 40 meq via ORAL
  Filled 2021-12-04: qty 2

## 2021-12-04 MED ORDER — MELATONIN 5 MG PO TABS
5.0000 mg | ORAL_TABLET | Freq: Every day | ORAL | Status: DC
  Administered 2021-12-04: 22:00:00 5 mg via ORAL
  Filled 2021-12-04: qty 1

## 2021-12-04 MED ORDER — DIPHENHYDRAMINE HCL 50 MG/ML IJ SOLN
25.0000 mg | Freq: Once | INTRAMUSCULAR | Status: AC
  Administered 2021-12-04: 02:00:00 25 mg via INTRAVENOUS
  Filled 2021-12-04: qty 1

## 2021-12-04 MED ORDER — DULOXETINE HCL 20 MG PO CPEP
20.0000 mg | ORAL_CAPSULE | Freq: Every day | ORAL | Status: DC
  Filled 2021-12-04: qty 1

## 2021-12-04 MED ORDER — DULOXETINE HCL 20 MG PO CPEP
20.0000 mg | ORAL_CAPSULE | Freq: Every day | ORAL | Status: DC
  Administered 2021-12-05: 20 mg via ORAL
  Filled 2021-12-04 (×2): qty 1

## 2021-12-04 MED ORDER — FENTANYL CITRATE PF 50 MCG/ML IJ SOSY: 12.5000 ug | PREFILLED_SYRINGE | Freq: Once | INTRAMUSCULAR | Status: DC | PRN

## 2021-12-04 MED ORDER — ROPINIROLE HCL 1 MG PO TABS: 0.5000 mg | ORAL_TABLET | Freq: Every day | ORAL | Status: DC

## 2021-12-04 MED ORDER — ROPINIROLE HCL 1 MG PO TABS
0.5000 mg | ORAL_TABLET | Freq: Every day | ORAL | Status: DC
  Administered 2021-12-04: 21:00:00 0.5 mg via ORAL
  Filled 2021-12-04: qty 1

## 2021-12-04 NOTE — Progress Notes (Signed)
PROGRESS NOTE    Shane SergeantLarry Siddique Sr.  ZOX:[id number] DOB: 11/08/1986 DOA: 12/02/2021 PCP: Evelene CroonNiemeyer, Meindert, MD    Assessment & Plan:   Principal Problem:   Acute pancreatitis Active Problems:   Hypokalemia   Pancreatic cyst   RLS (restless legs syndrome)   History of DVT (deep vein thrombosis)   Chronic anticoagulation   Alcohol use disorder in remission   Elevated blood-pressure reading, without diagnosis of hypertension   COVID-19 virus infection   Acute pancreatitis: continue on IVFs. Hx of alcohol abuse, in remission x 11 months. Has pancreatic cyst. Advanced diet to soft diet for lunch and will advance for dinner if pt is tolerating    Hypokalemia: KCl repleated   Elevated BP:  w/o hx of HTN. WNL currently    COVID-19 infection: asymptomatic. Continue on airborne & contact precautions.    RLS: continue on home dose of ropinirole  Depression: severity unknown. Continue on home dose of duloxetine    Hx of DVT: continue on xarelto     DVT prophylaxis: xarelto  Code Status: full  Family Communication: discussed pt's care w/ pt's family at bedside and answered their questions  Disposition Plan: likely d/c home tomorrow  Level of care: Med-Surg  Status is: Inpatient  Remains inpatient appropriate because: severity of illness,         Consultants:    Procedures:   Antimicrobials:   Subjective: Pt c/o abd pain  Objective: Vitals:   12/03/21 0830 12/03/21 1415 12/03/21 1729 12/03/21 1730  BP: 133/83 139/88  139/80  Pulse: 95 100 100 87  Resp: 20   18  Temp:  99.8 F (37.7 C) 99.8 F (37.7 C) 98.8 F (37.1 C)  TempSrc:  Oral Oral Oral  SpO2: 93% 96%    Weight:    66.1 kg  Height:    6\' 1"  (1.854 m)    Intake/Output Summary (Last 24 hours) at 12/04/2021 1524 Last data filed at 12/04/2021 0900 Gross per 24 hour  Intake 2665.65 ml  Output --  Net 2665.65 ml   Filed Weights   12/03/21 1730  Weight: 66.1 kg    Examination:  General exam:  Appears calm and comfortable  Respiratory system: Clear to auscultation. Respiratory effort normal. Cardiovascular system: S1 & S2 +. No  rubs, gallops or clicks. No pedal edema. Gastrointestinal system: Abdomen is nondistended, soft and tenderness. Normal bowel sounds heard. Central nervous system: Alert and oriented. Moves all extremities  Psychiatry: Judgement and insight appear normal. Flat mood and affect    Data Reviewed: I have personally reviewed following labs and imaging studies  CBC: Recent Labs  Lab 12/02/21 2258  WBC 10.5  HGB 15.1  HCT 43.9  MCV 91.6  PLT 251   Basic Metabolic Panel: Recent Labs  Lab 12/02/21 2258  NA 138  K 3.3*  CL 101  CO2 27  GLUCOSE 125*  BUN 10  CREATININE 0.98  CALCIUM 9.5   GFR: Estimated Creatinine Clearance: 98.4 mL/min (by C-G formula based on SCr of 0.98 mg/dL). Liver Function Tests: Recent Labs  Lab 12/02/21 2258  AST 21  ALT 13  ALKPHOS 59  BILITOT 0.7  PROT 7.2  ALBUMIN 4.2   Recent Labs  Lab 12/02/21 2258  LIPASE 3,420*   No results for input(s): AMMONIA in the last 168 hours. Coagulation Profile: No results for input(s): INR, PROTIME in the last 168 hours. Cardiac Enzymes: No results for input(s): CKTOTAL, CKMB, CKMBINDEX, TROPONINI in the last 168 hours. BNP (  last 3 results) No results for input(s): PROBNP in the last 8760 hours. HbA1C: No results for input(s): HGBA1C in the last 72 hours. CBG: No results for input(s): GLUCAP in the last 168 hours. Lipid Profile: No results for input(s): CHOL, HDL, LDLCALC, TRIG, CHOLHDL, LDLDIRECT in the last 72 hours. Thyroid Function Tests: No results for input(s): TSH, T4TOTAL, FREET4, T3FREE, THYROIDAB in the last 72 hours. Anemia Panel: No results for input(s): VITAMINB12, FOLATE, FERRITIN, TIBC, IRON, RETICCTPCT in the last 72 hours. Sepsis Labs: No results for input(s): PROCALCITON, LATICACIDVEN in the last 168 hours.  Recent Results (from the past 240  hour(s))  Resp Panel by RT-PCR (Flu A&B, Covid) Nasopharyngeal Swab     Status: Abnormal   Collection Time: 12/03/21  2:01 AM   Specimen: Nasopharyngeal Swab; Nasopharyngeal(NP) swabs in vial transport medium  Result Value Ref Range Status   SARS Coronavirus 2 by RT PCR POSITIVE (A) NEGATIVE Final    Comment: (NOTE) SARS-CoV-2 target nucleic acids are DETECTED.  The SARS-CoV-2 RNA is generally detectable in upper respiratory specimens during the acute phase of infection. Positive results are indicative of the presence of the identified virus, but do not rule out bacterial infection or co-infection with other pathogens not detected by the test. Clinical correlation with patient history and other diagnostic information is necessary to determine patient infection status. The expected result is Negative.  Fact Sheet for Patients: BloggerCourse.com  Fact Sheet for Healthcare Providers: SeriousBroker.it  This test is not yet approved or cleared by the [place] FDA and  has been authorized for detection and/or diagnosis of SARS-CoV-2 by FDA under an Emergency Use Authorization (EUA).  This EUA will remain in effect (meaning this test can be used) for the duration of  the COVID-19 declaration under Section 564(b)(1) of the A ct, 21 U.S.C. section 360bbb-3(b)(1), unless the authorization is terminated or revoked sooner.     Influenza A by PCR NEGATIVE NEGATIVE Final   Influenza B by PCR NEGATIVE NEGATIVE Final    Comment: (NOTE) The Xpert Xpress SARS-CoV-2/FLU/RSV plus assay is intended as an aid in the diagnosis of influenza from Nasopharyngeal swab specimens and should not be used as a sole basis for treatment. Nasal washings and aspirates are unacceptable for Xpert Xpress SARS-CoV-2/FLU/RSV testing.  Fact Sheet for Patients: BloggerCourse.com  Fact Sheet for Healthcare  Providers: SeriousBroker.it  This test is not yet approved or cleared by the [place] FDA and has been authorized for detection and/or diagnosis of SARS-CoV-2 by FDA under an Emergency Use Authorization (EUA). This EUA will remain in effect (meaning this test can be used) for the duration of the COVID-19 declaration under Section 564(b)(1) of the Act, 21 U.S.C. section 360bbb-3(b)(1), unless the authorization is terminated or revoked.  Performed at [facility], [address] [place], [place] [address]          Radiology Studies: CT ABDOMEN PELVIS W CONTRAST  Result Date: 12/03/2021 CLINICAL DATA:  Epigastric abdominal pain radiating to back. EXAM: CT ABDOMEN AND PELVIS WITH CONTRAST TECHNIQUE: Multidetector CT imaging of the abdomen and pelvis was performed using the standard protocol following bolus administration of intravenous contrast. RADIATION DOSE REDUCTION: This exam was performed according to the departmental dose-optimization program which includes automated exposure control, adjustment of the mA and/or kV according to patient size and/or use of iterative reconstruction technique. CONTRAST:  OMNIPAQUE IOHEXOL 300 MG/ML  SOLN COMPARISON:  01/10/2021 FINDINGS: Lower chest: No acute abnormality. Hepatobiliary: No focal hepatic abnormality.  Gallbladder unremarkable. Pancreas: Diffuse edema/inflammation around the pancreas compatible with acute pancreatitis. Focal fluid collection in the pancreatic head/uncinate process measuring 2.1 cm. This fluid collection previously measured 2.9 cm. Second anterior pancreatic head cyst has resolved. Spleen: No focal abnormality.  Normal size. Adrenals/Urinary Tract: No adrenal abnormality. No focal renal abnormality. No stones or hydronephrosis. Urinary bladder is unremarkable. Stomach/Bowel: Stomach, large and small bowel grossly unremarkable. Vascular/Lymphatic: No evidence of aneurysm or adenopathy.  Reproductive: No visible focal abnormality. Other: No free fluid or free air. Musculoskeletal: No acute bony abnormality. IMPRESSION: Edema/stranding around the pancreas compatible with acute pancreatitis. Fluid collection/cyst in the uncinate process is decreased in size since prior study. Pancreatic head cyst on prior study has resolved. Electronically Signed   By: Charlett Nose M.D.   On: 12/03/2021 01:22        Scheduled Meds:  DULoxetine  20 mg Oral Daily   rivaroxaban  20 mg Oral Daily   rOPINIRole  0.5 mg Oral QHS   Continuous Infusions:  0.9 % NaCl with KCl 40 mEq / L 100 mL/hr at 12/04/21 1243     LOS: 1 day    Time spent: 25 mins     Charise Killian, MD [facility] Pager [phone]  If 7PM-7AM, please contact night-coverage 12/04/2021, 3:24 PM

## 2021-12-05 DIAGNOSIS — U071 COVID-19: Secondary | ICD-10-CM

## 2021-12-05 LAB — CBC
HCT: 40.2 % (ref 39.0–52.0)
Hemoglobin: 13.1 g/dL (ref 13.0–17.0)
MCH: 30.8 pg (ref 26.0–34.0)
MCHC: 32.6 g/dL (ref 30.0–36.0)
MCV: 94.4 fL (ref 80.0–100.0)
Platelets: 200 10*3/uL (ref 150–400)
RBC: 4.26 MIL/uL (ref 4.22–5.81)
RDW: 12.9 % (ref 11.5–15.5)
WBC: 6.9 10*3/uL (ref 4.0–10.5)
nRBC: 0 % (ref 0.0–0.2)

## 2021-12-05 LAB — BASIC METABOLIC PANEL
Anion gap: 5 (ref 5–15)
BUN: 9 mg/dL (ref 6–20)
CO2: 24 mmol/L (ref 22–32)
Calcium: 9 mg/dL (ref 8.9–10.3)
Chloride: 106 mmol/L (ref 98–111)
Creatinine, Ser: 0.86 mg/dL (ref 0.61–1.24)
GFR, Estimated: >60 mL/min (ref >60)
Glucose, Bld: 98 mg/dL (ref 70–99)
Potassium: 5.1 mmol/L (ref 3.5–5.1)
Sodium: 135 mmol/L (ref 135–145)

## 2021-12-05 LAB — MAGNESIUM: Magnesium: 2.2 mg/dL (ref 1.7–2.4)

## 2021-12-05 NOTE — Discharge Summary (Signed)
Physician Discharge Summary  Shane Gudiel Sr. [id number] DOB: 1986/01/12 DOA: 12/02/2021  PCP: Lorelee Market, MD  Admit date: 12/02/2021 Discharge date: 12/05/2021  Admitted From: home  Disposition:  home   Recommendations for Outpatient Follow-up:  Follow up with PCP in 1-2 weeks   Home Health: no  Equipment/Devices:  Discharge Condition: stable  CODE STATUS: full  Diet recommendation: regular  Brief/Interim Summary: HPI was taken from Dr. Damita Dunnings: Shane Jenkins Sr. is a 36 y.o. 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  He had 3 episodes of nonbloody nonbilious emesis.  He has chills but no fever.  Reports that he has been abstinent from alcohol since his last hospitalization 11 months ago   ED course: On arrival BP 136/108 166/103, with low-grade temperature of 99.9 and otherwise normal vitals Blood work: Lipase 3420 with normal LFTs and EtOH less than 10 CBC and CMP unremarkable except for mild hypokalemia of 3.3 UDS positive for opiates   Imaging: CT abdomen and pelvis with contrast showing edema/stranding around the pancreas compatible with acute pancreatitis, fluid collection/cyst in the uncinate process decreased in size from prior study.  Pancreatic head cyst on prior study resolved   Patient treated with multiple rounds of IV pain meds, IV fluid boluses and IV antiemetics.  Hospitalist consulted for admission due to persistent intractable pain.   As per Dr. Jimmye Norman 1/25-1/26/23: Pt was found to have acute pancreatitis and was treated w/ bowel rest, IVFs, zofran prn & pain meds prn. Pt was tolerating a po diet prior to d/c. Of note, pt was found to have COVID19 but pt was asymptomatic so pt did not receive any treatment for it. Pt  was ambulating and transferring independently so PT/OT were not needed. For more information, please see previous progress notes.  Discharge Diagnoses:  Principal Problem:   Acute pancreatitis Active Problems:   Hypokalemia   Pancreatic cyst   RLS (restless legs syndrome)   History of DVT (deep vein thrombosis)   Chronic anticoagulation   Alcohol use disorder in remission   Elevated blood-pressure reading, without diagnosis of hypertension   COVID-19 virus infection  Acute pancreatitis: resolved. Hx of alcohol abuse, in remission x 11 months. Has pancreatic cyst. Continue on regular diet   Hypokalemia: WNL today    Elevated BP:  w/o hx of HTN. WNL currently    COVID-19 infection: asymptomatic. Continue on airborne & contact precautions.    RLS: continue on home dose of ropinirole   Depression: severity unknown. Continue on home dose of duloxetine    Hx of DVT: continue on xarelto   Discharge Instructions  Discharge Instructions     Diet general   Complete by: As directed    Discharge instructions   Complete by: As directed    F/u w/ PCP in 1-2 weeks   Increase activity slowly   Complete by: As directed       Allergies as of 12/05/2021       Reactions   Oxycodone Rash   Rash/itching which required benadryl to resolve   Sudafed [pseudoephedrine]    History per pt of this being linked with his seizures        Medication List     STOP taking these medications    metoprolol tartrate 50 MG tablet Commonly known as:  LOPRESSOR       TAKE these medications    acetaminophen 500 MG tablet Commonly known as: TYLENOL Take 500 mg by mouth every 6 (six) hours as needed for headache, moderate pain or mild pain.   ascorbic acid 500 MG tablet Commonly known as: VITAMIN C Take 1 tablet (500 mg total) by mouth 2 (two) times daily.   DULoxetine 20 MG capsule Commonly known as: CYMBALTA Take 1 capsule (20 mg total) by mouth daily.   ferrous [id number]  C-folic acid capsule Commonly known as: TRINSICON / FOLTRIN Take 1 capsule by mouth 2 (two) times daily after a meal.   gabapentin 100 MG capsule Commonly known as: NEURONTIN Take 200 mg by mouth in the morning and at bedtime.   multivitamin with minerals Tabs tablet Take 1 tablet by mouth daily.   OLANZapine zydis 5 MG disintegrating tablet Commonly known as: ZYPREXA Take 1 tablet (5 mg total) by mouth at bedtime.   pantoprazole 40 MG tablet Commonly known as: PROTONIX Take 1 tablet (40 mg total) by mouth daily.   rivaroxaban 20 MG Tabs tablet Commonly known as: XARELTO Take 1 tablet (20 mg total) by mouth daily with supper.   rOPINIRole 0.5 MG tablet Commonly known as: REQUIP Take 0.5 mg by mouth at bedtime.   traZODone 50 MG tablet Commonly known as: DESYREL Take 50 mg by mouth at bedtime.   vitamin A 3 MG (10000 UNITS) capsule Take 1 capsule (10,000 Units total) by mouth daily.   Vitamin D (Ergocalciferol) 1.25 MG (50000 UNIT) Caps capsule Commonly known as: DRISDOL Take 1 capsule (50,000 Units total) by mouth every 7 (seven) days.        Allergies  Allergen Reactions   Oxycodone Rash    Rash/itching which required benadryl to resolve   Sudafed [Pseudoephedrine]     History per pt of this being linked with his seizures    Consultations:    Procedures/Studies: CT ABDOMEN PELVIS W CONTRAST  Result Date: 12/03/2021 CLINICAL DATA:  Epigastric abdominal pain radiating to back. EXAM: CT ABDOMEN AND PELVIS WITH CONTRAST TECHNIQUE: Multidetector CT imaging of the abdomen and pelvis was performed using the standard protocol following bolus administration of intravenous contrast. RADIATION DOSE REDUCTION: This exam was performed according to the departmental dose-optimization program which includes automated exposure control, adjustment of the mA and/or kV according to patient size and/or use of iterative reconstruction technique. CONTRAST:  159mL OMNIPAQUE IOHEXOL  300 MG/ML  SOLN COMPARISON:  01/10/2021 FINDINGS: Lower chest: No acute abnormality. Hepatobiliary: No focal hepatic abnormality. Gallbladder unremarkable. Pancreas: Diffuse edema/inflammation around the pancreas compatible with acute pancreatitis. Focal fluid collection in the pancreatic head/uncinate process measuring 2.1 cm. This fluid collection previously measured 2.9 cm. Second anterior pancreatic head cyst has resolved. Spleen: No focal abnormality.  Normal size. Adrenals/Urinary Tract: No adrenal abnormality. No focal renal abnormality. No stones or hydronephrosis. Urinary bladder is unremarkable. Stomach/Bowel: Stomach, large and small bowel grossly unremarkable. Vascular/Lymphatic: No evidence of aneurysm or adenopathy. Reproductive: No visible focal abnormality. Other: No free fluid or free air. Musculoskeletal: No acute bony abnormality. IMPRESSION: Edema/stranding around the pancreas compatible with acute pancreatitis. Fluid collection/cyst in the uncinate process is decreased in size since prior study. Pancreatic head cyst on prior study has resolved. Electronically Signed   By: Rolm Baptise M.D.   On: 12/03/2021 01:22   (Echo, Carotid, EGD, Colonoscopy, ERCP)    Subjective: Pt c/o fatigue    Discharge Exam: Vitals:   12/05/21 0420 12/05/21  0809  BP: 134/78 122/79  Pulse: (!) 56 64  Resp: 16 18  Temp: 97.7 F (36.5 C) 97.6 F (36.4 C)  SpO2: 100% 100%   Vitals:   12/04/21 1735 12/04/21 1934 12/05/21 0420 12/05/21 0809  BP: 116/74 117/72 134/78 122/79  Pulse: (!) 55 60 (!) 56 64  Resp:  20 16 18   Temp: 97.9 F (36.6 C) 98.6 F (37 C) 97.7 F (36.5 C) 97.6 F (36.4 C)  TempSrc: Oral Oral Oral Oral  SpO2: 100% 97% 100% 100%  Weight:      Height:        General: Pt is alert, awake, not in acute distress Cardiovascular: S1/S2 +, no rubs, no gallops Respiratory: CTA bilaterally, no wheezing, no rhonchi Abdominal: Soft, NT, ND, bowel sounds + Extremities: no edema, no  cyanosis    The results of significant diagnostics from this hospitalization (including imaging, microbiology, ancillary and laboratory) are listed below for reference.     Microbiology: Recent Results (from the past 240 hour(s))  Resp Panel by RT-PCR (Flu A&B, Covid) Nasopharyngeal Swab     Status: Abnormal   Collection Time: 12/03/21  2:01 AM   Specimen: Nasopharyngeal Swab; Nasopharyngeal(NP) swabs in vial transport medium  Result Value Ref Range Status   SARS Coronavirus 2 by RT PCR POSITIVE (A) NEGATIVE Final    Comment: (NOTE) SARS-CoV-2 target nucleic acids are DETECTED.  The SARS-CoV-2 RNA is generally detectable in upper respiratory specimens during the acute phase of infection. Positive results are indicative of the presence of the identified virus, but do not rule out bacterial infection or co-infection with other pathogens not detected by the test. Clinical correlation with patient history and other diagnostic information is necessary to determine patient infection status. The expected result is Negative.  Fact Sheet for Patients: EntrepreneurPulse.com.au  Fact Sheet for Healthcare Providers: IncredibleEmployment.be  This test is not yet approved or cleared by the [place] FDA and  has been authorized for detection and/or diagnosis of SARS-CoV-2 by FDA under an Emergency Use Authorization (EUA).  This EUA will remain in effect (meaning this test can be used) for the duration of  the COVID-19 declaration under Section 564(b)(1) of the A ct, 21 U.S.C. section 360bbb-3(b)(1), unless the authorization is terminated or revoked sooner.     Influenza A by PCR NEGATIVE NEGATIVE Final   Influenza B by PCR NEGATIVE NEGATIVE Final    Comment: (NOTE) The Xpert Xpress SARS-CoV-2/FLU/RSV plus assay is intended as an aid in the diagnosis of influenza from Nasopharyngeal swab specimens and should not be used as a sole basis for  treatment. Nasal washings and aspirates are unacceptable for Xpert Xpress SARS-CoV-2/FLU/RSV testing.  Fact Sheet for Patients: EntrepreneurPulse.com.au  Fact Sheet for Healthcare Providers: IncredibleEmployment.be  This test is not yet approved or cleared by the [place] FDA and has been authorized for detection and/or diagnosis of SARS-CoV-2 by FDA under an Emergency Use Authorization (EUA). This EUA will remain in effect (meaning this test can be used) for the duration of the COVID-19 declaration under Section 564(b)(1) of the Act, 21 U.S.C. section 360bbb-3(b)(1), unless the authorization is terminated or revoked.  Performed at [facility], [address] [place], [place] [address]      Labs: BNP (last 3 results) No results for input(s): BNP in the last 8760 hours. Basic Metabolic Panel: Recent Labs  Lab 12/02/21 2258 12/05/21 0555  NA 138 135  K 3.3* 5.1  CL 101 106  CO2 27  24  GLUCOSE 125* 98  BUN 10 9  CREATININE 0.98 0.86  CALCIUM 9.5 9.0  MG  --  2.2   Liver Function Tests: Recent Labs  Lab 12/02/21 2258  AST 21  ALT 13  ALKPHOS 59  BILITOT 0.7  PROT 7.2  ALBUMIN 4.2   Recent Labs  Lab 12/02/21 2258  LIPASE 3,420*   No results for input(s): AMMONIA in the last 168 hours. CBC: Recent Labs  Lab 12/02/21 2258 12/05/21 0555  WBC 10.5 6.9  HGB 15.1 13.1  HCT 43.9 40.2  MCV 91.6 94.4  PLT 251 200   Cardiac Enzymes: No results for input(s): CKTOTAL, CKMB, CKMBINDEX, TROPONINI in the last 168 hours. BNP: Invalid input(s): POCBNP CBG: No results for input(s): GLUCAP in the last 168 hours. D-Dimer No results for input(s): DDIMER in the last 72 hours. Hgb A1c No results for input(s): HGBA1C in the last 72 hours. Lipid Profile No results for input(s): CHOL, HDL, LDLCALC, TRIG, CHOLHDL, LDLDIRECT in the last 72 hours. Thyroid function studies No results for input(s): TSH, T4TOTAL, T3FREE,  THYROIDAB in the last 72 hours.  Invalid input(s): FREET3 Anemia work up No results for input(s): VITAMINB12, FOLATE, FERRITIN, TIBC, IRON, RETICCTPCT in the last 72 hours. Urinalysis    Component Value Date/Time   COLORURINE YELLOW 12/03/2021 0003   APPEARANCEUR CLEAR 12/03/2021 0003   LABSPEC 1.015 12/03/2021 0003   PHURINE 7.5 12/03/2021 0003   GLUCOSEU NEGATIVE 12/03/2021 0003   HGBUR NEGATIVE 12/03/2021 0003   BILIRUBINUR NEGATIVE 12/03/2021 0003   KETONESUR 40 (A) 12/03/2021 0003   PROTEINUR TRACE (A) 12/03/2021 0003   NITRITE NEGATIVE 12/03/2021 0003   LEUKOCYTESUR NEGATIVE 12/03/2021 0003   Sepsis Labs Invalid input(s): PROCALCITONIN,  WBC,  LACTICIDVEN Microbiology Recent Results (from the past 240 hour(s))  Resp Panel by RT-PCR (Flu A&B, Covid) Nasopharyngeal Swab     Status: Abnormal   Collection Time: 12/03/21  2:01 AM   Specimen: Nasopharyngeal Swab; Nasopharyngeal(NP) swabs in vial transport medium  Result Value Ref Range Status   SARS Coronavirus 2 by RT PCR POSITIVE (A) NEGATIVE Final    Comment: (NOTE) SARS-CoV-2 target nucleic acids are DETECTED.  The SARS-CoV-2 RNA is generally detectable in upper respiratory specimens during the acute phase of infection. Positive results are indicative of the presence of the identified virus, but do not rule out bacterial infection or co-infection with other pathogens not detected by the test. Clinical correlation with patient history and other diagnostic information is necessary to determine patient infection status. The expected result is Negative.  Fact Sheet for Patients: EntrepreneurPulse.com.au  Fact Sheet for Healthcare Providers: IncredibleEmployment.be  This test is not yet approved or cleared by the [place] FDA and  has been authorized for detection and/or diagnosis of SARS-CoV-2 by FDA under an Emergency Use Authorization (EUA).  This EUA will remain in effect  (meaning this test can be used) for the duration of  the COVID-19 declaration under Section 564(b)(1) of the A ct, 21 U.S.C. section 360bbb-3(b)(1), unless the authorization is terminated or revoked sooner.     Influenza A by PCR NEGATIVE NEGATIVE Final   Influenza B by PCR NEGATIVE NEGATIVE Final    Comment: (NOTE) The Xpert Xpress SARS-CoV-2/FLU/RSV plus assay is intended as an aid in the diagnosis of influenza from Nasopharyngeal swab specimens and should not be used as a sole basis for treatment. Nasal washings and aspirates are unacceptable for Xpert Xpress SARS-CoV-2/FLU/RSV testing.  Fact Sheet for Patients: EntrepreneurPulse.com.au  Fact Sheet for Healthcare Providers: IncredibleEmployment.be  This test is not yet approved or cleared by the [place] FDA and has been authorized for detection and/or diagnosis of SARS-CoV-2 by FDA under an Emergency Use Authorization (EUA). This EUA will remain in effect (meaning this test can be used) for the duration of the COVID-19 declaration under Section 564(b)(1) of the Act, 21 U.S.C. section 360bbb-3(b)(1), unless the authorization is terminated or revoked.  Performed at [facility], [address] [place], [place] [address]      Time coordinating discharge: Over 30 minutes  SIGNED:   Wyvonnia Dusky, MD  [facility] 12/05/2021, 12:00 PM Pager   If 7PM-7AM, please contact night-coverage

## 2021-12-05 NOTE — Progress Notes (Signed)
Discharge instructions and med details reviewed with patient. All questions answered. Printed AVS and work note given to patient.  IV removed

## 2022-04-11 ENCOUNTER — Emergency Department: Payer: Medicaid Other

## 2022-04-11 ENCOUNTER — Other Ambulatory Visit: Payer: Self-pay

## 2022-04-11 ENCOUNTER — Inpatient Hospital Stay
Admission: EM | Admit: 2022-04-11 | Discharge: 2022-04-14 | DRG: 439 | Disposition: A | Discharge status: Home / Self Care | Payer: Medicaid Other | Attending: Internal Medicine | Admitting: Internal Medicine

## 2022-04-11 ENCOUNTER — Encounter: Payer: Self-pay | Admitting: Family Medicine

## 2022-04-11 DIAGNOSIS — F17200 Nicotine dependence, unspecified, uncomplicated: Secondary | ICD-10-CM | POA: Diagnosis present

## 2022-04-11 DIAGNOSIS — F10129 Alcohol abuse with intoxication, unspecified: Secondary | ICD-10-CM | POA: Diagnosis present

## 2022-04-11 DIAGNOSIS — Z885 Allergy status to narcotic agent status: Secondary | ICD-10-CM

## 2022-04-11 DIAGNOSIS — K852 Alcohol induced acute pancreatitis without necrosis or infection: Secondary | ICD-10-CM | POA: Diagnosis not present

## 2022-04-11 DIAGNOSIS — K862 Cyst of pancreas: Secondary | ICD-10-CM | POA: Diagnosis present

## 2022-04-11 DIAGNOSIS — F10929 Alcohol use, unspecified with intoxication, unspecified: Secondary | ICD-10-CM

## 2022-04-11 DIAGNOSIS — Z8616 Personal history of COVID-19: Secondary | ICD-10-CM | POA: Diagnosis not present

## 2022-04-11 DIAGNOSIS — I160 Hypertensive urgency: Secondary | ICD-10-CM | POA: Diagnosis present

## 2022-04-11 DIAGNOSIS — E872 Acidosis, unspecified: Secondary | ICD-10-CM | POA: Diagnosis present

## 2022-04-11 DIAGNOSIS — Y906 Blood alcohol level of 120-199 mg/100 ml: Secondary | ICD-10-CM | POA: Diagnosis present

## 2022-04-11 DIAGNOSIS — K8681 Exocrine pancreatic insufficiency: Secondary | ICD-10-CM | POA: Diagnosis present

## 2022-04-11 DIAGNOSIS — Z888 Allergy status to other drugs, medicaments and biological substances status: Secondary | ICD-10-CM | POA: Diagnosis not present

## 2022-04-11 DIAGNOSIS — K859 Acute pancreatitis without necrosis or infection, unspecified: Secondary | ICD-10-CM | POA: Diagnosis not present

## 2022-04-11 DIAGNOSIS — D696 Thrombocytopenia, unspecified: Secondary | ICD-10-CM | POA: Diagnosis present

## 2022-04-11 DIAGNOSIS — Z86718 Personal history of other venous thrombosis and embolism: Secondary | ICD-10-CM

## 2022-04-11 DIAGNOSIS — K861 Other chronic pancreatitis: Secondary | ICD-10-CM | POA: Diagnosis present

## 2022-04-11 DIAGNOSIS — R1084 Generalized abdominal pain: Secondary | ICD-10-CM

## 2022-04-11 DIAGNOSIS — F1020 Alcohol dependence, uncomplicated: Secondary | ICD-10-CM | POA: Diagnosis present

## 2022-04-11 LAB — CBC WITH DIFFERENTIAL/PLATELET
Abs Immature Granulocytes: 0.02 10*3/uL (ref 0.00–0.07)
Basophils Absolute: 0 10*3/uL (ref 0.0–0.1)
Basophils Relative: 0 %
Eosinophils Absolute: 0.2 10*3/uL (ref 0.0–0.5)
Eosinophils Relative: 2 %
HCT: 40.8 % (ref 39.0–52.0)
Hemoglobin: 14 g/dL (ref 13.0–17.0)
Immature Granulocytes: 0 %
Lymphocytes Relative: 8 %
Lymphs Abs: 0.6 10*3/uL — ABNORMAL LOW (ref 0.7–4.0)
MCH: 32.6 pg (ref 26.0–34.0)
MCHC: 34.3 g/dL (ref 30.0–36.0)
MCV: 94.9 fL (ref 80.0–100.0)
Monocytes Absolute: 0.5 10*3/uL (ref 0.1–1.0)
Monocytes Relative: 6 %
Neutro Abs: 6.3 10*3/uL (ref 1.7–7.7)
Neutrophils Relative %: 84 %
Platelets: 116 10*3/uL — ABNORMAL LOW (ref 150–400)
RBC: 4.3 MIL/uL (ref 4.22–5.81)
RDW: 12.7 % (ref 11.5–15.5)
WBC: 7.5 10*3/uL (ref 4.0–10.5)
nRBC: 0 % (ref 0.0–0.2)

## 2022-04-11 LAB — COMPREHENSIVE METABOLIC PANEL
ALT: 25 U/L (ref 0–44)
AST: 78 U/L — ABNORMAL HIGH (ref 15–41)
Albumin: 4.4 g/dL (ref 3.5–5.0)
Alkaline Phosphatase: 67 U/L (ref 38–126)
Anion gap: 12 (ref 5–15)
BUN: 8 mg/dL (ref 6–20)
CO2: 21 mmol/L — ABNORMAL LOW (ref 22–32)
Calcium: 8.8 mg/dL — ABNORMAL LOW (ref 8.9–10.3)
Chloride: 105 mmol/L (ref 98–111)
Creatinine, Ser: 0.95 mg/dL (ref 0.61–1.24)
GFR, Estimated: >60 mL/min (ref >60)
Glucose, Bld: 104 mg/dL — ABNORMAL HIGH (ref 70–99)
Potassium: 4.4 mmol/L (ref 3.5–5.1)
Sodium: 138 mmol/L (ref 135–145)
Total Bilirubin: 1.2 mg/dL (ref 0.3–1.2)
Total Protein: 7.6 g/dL (ref 6.5–8.1)

## 2022-04-11 LAB — FIBRINOGEN: Fibrinogen: 201 mg/dL — ABNORMAL LOW (ref 210–475)

## 2022-04-11 LAB — TYPE AND SCREEN
ABO/RH(D): A POS
Antibody Screen: NEGATIVE

## 2022-04-11 LAB — CBC
HCT: 41.2 % (ref 39.0–52.0)
Hemoglobin: 14.1 g/dL (ref 13.0–17.0)
MCH: 31.8 pg (ref 26.0–34.0)
MCHC: 34.2 g/dL (ref 30.0–36.0)
MCV: 93 fL (ref 80.0–100.0)
Platelets: 91 10*3/uL — ABNORMAL LOW (ref 150–400)
RBC: 4.43 MIL/uL (ref 4.22–5.81)
RDW: 12.3 % (ref 11.5–15.5)
WBC: 8 10*3/uL (ref 4.0–10.5)
nRBC: 0 % (ref 0.0–0.2)

## 2022-04-11 LAB — PROTIME-INR
INR: 0.9 (ref 0.8–1.2)
Prothrombin Time: 12.5 seconds (ref 11.4–15.2)

## 2022-04-11 LAB — LACTIC ACID, PLASMA
Lactic Acid, Venous: 3.4 mmol/L (ref 0.5–1.9)
Lactic Acid, Venous: 4.1 mmol/L (ref 0.5–1.9)
Lactic Acid, Venous: 0.7 mmol/L (ref 0.5–1.9)

## 2022-04-11 LAB — TROPONIN I (HIGH SENSITIVITY): Troponin I (High Sensitivity): 10 ng/L (ref <18)

## 2022-04-11 LAB — PROCALCITONIN: Procalcitonin: <0.1 ng/mL

## 2022-04-11 LAB — LIPASE, BLOOD: Lipase: 2607 U/L — ABNORMAL HIGH (ref 11–51)

## 2022-04-11 LAB — ETHANOL: Alcohol, Ethyl (B): 157 mg/dL — ABNORMAL HIGH (ref <10)

## 2022-04-11 MED ORDER — HYDRALAZINE HCL 20 MG/ML IJ SOLN
10.0000 mg | INTRAMUSCULAR | Status: DC | PRN
  Administered 2022-04-11: 10 mg via INTRAVENOUS
  Filled 2022-04-11: qty 1

## 2022-04-11 MED ORDER — LORAZEPAM 2 MG/ML IJ SOLN: 1.0000 mg | INTRAMUSCULAR | Status: DC | PRN

## 2022-04-11 MED ORDER — ONDANSETRON HCL 4 MG/2ML IJ SOLN
4.0000 mg | Freq: Four times a day (QID) | INTRAMUSCULAR | Status: DC | PRN
  Administered 2022-04-11: 4 mg via INTRAVENOUS
  Filled 2022-04-11: qty 2

## 2022-04-11 MED ORDER — SODIUM CHLORIDE 0.9 % IV SOLN: INTRAVENOUS | Status: DC

## 2022-04-11 MED ORDER — ONDANSETRON HCL 4 MG PO TABS: 4.0000 mg | ORAL_TABLET | Freq: Four times a day (QID) | ORAL | Status: DC | PRN

## 2022-04-11 MED ORDER — LORAZEPAM 1 MG PO TABS: 1.0000 mg | ORAL_TABLET | ORAL | Status: AC | PRN

## 2022-04-11 MED ORDER — THIAMINE HCL 100 MG/ML IJ SOLN: Freq: Once | INTRAVENOUS | Status: DC

## 2022-04-11 MED ORDER — FOLIC ACID 5 MG/ML IJ SOLN
1.0000 mg | Freq: Every day | INTRAMUSCULAR | Status: DC
  Filled 2022-04-11: qty 0.2

## 2022-04-11 MED ORDER — IOHEXOL 300 MG/ML  SOLN
100.0000 mL | Freq: Once | INTRAMUSCULAR | Status: AC | PRN
  Administered 2022-04-11: 100 mL via INTRAVENOUS

## 2022-04-11 MED ORDER — ACETAMINOPHEN 650 MG RE SUPP: 650.0000 mg | Freq: Four times a day (QID) | RECTAL | Status: DC | PRN

## 2022-04-11 MED ORDER — FOLIC ACID 1 MG PO TABS
1.0000 mg | ORAL_TABLET | Freq: Every day | ORAL | Status: DC
  Administered 2022-04-11 – 2022-04-14 (×4): 1 mg via ORAL
  Filled 2022-04-11 (×3): qty 1

## 2022-04-11 MED ORDER — PANTOPRAZOLE SODIUM 40 MG PO TBEC
40.0000 mg | DELAYED_RELEASE_TABLET | Freq: Every day | ORAL | Status: DC
  Administered 2022-04-11 – 2022-04-14 (×4): 40 mg via ORAL
  Filled 2022-04-11 (×4): qty 1

## 2022-04-11 MED ORDER — LORAZEPAM 2 MG/ML IJ SOLN
1.0000 mg | INTRAMUSCULAR | Status: AC | PRN
  Administered 2022-04-11: 1 mg via INTRAVENOUS
  Filled 2022-04-11: qty 1

## 2022-04-11 MED ORDER — LACTATED RINGERS IV BOLUS
1000.0000 mL | Freq: Once | INTRAVENOUS | Status: AC
  Administered 2022-04-11: 1000 mL via INTRAVENOUS

## 2022-04-11 MED ORDER — LABETALOL HCL 5 MG/ML IV SOLN: 20.0000 mg | INTRAVENOUS | Status: DC | PRN

## 2022-04-11 MED ORDER — TRAZODONE HCL 50 MG PO TABS
25.0000 mg | ORAL_TABLET | Freq: Every evening | ORAL | Status: DC | PRN
  Administered 2022-04-11 – 2022-04-12 (×2): 25 mg via ORAL
  Filled 2022-04-11 (×2): qty 1

## 2022-04-11 MED ORDER — MORPHINE SULFATE (PF) 4 MG/ML IV SOLN
4.0000 mg | Freq: Once | INTRAVENOUS | Status: AC
  Administered 2022-04-11: 4 mg via INTRAVENOUS
  Filled 2022-04-11: qty 1

## 2022-04-11 MED ORDER — THIAMINE HCL 100 MG PO TABS
100.0000 mg | ORAL_TABLET | Freq: Every day | ORAL | Status: DC
  Filled 2022-04-11: qty 1

## 2022-04-11 MED ORDER — KETOROLAC TROMETHAMINE 30 MG/ML IJ SOLN
15.0000 mg | Freq: Once | INTRAMUSCULAR | Status: AC
  Administered 2022-04-11: 15 mg via INTRAVENOUS
  Filled 2022-04-11: qty 1

## 2022-04-11 MED ORDER — GABAPENTIN 100 MG PO CAPS: 200.0000 mg | ORAL_CAPSULE | Freq: Two times a day (BID) | ORAL | Status: DC

## 2022-04-11 MED ORDER — MAGNESIUM HYDROXIDE 400 MG/5ML PO SUSP: 30.0000 mL | Freq: Every day | ORAL | Status: DC | PRN

## 2022-04-11 MED ORDER — THIAMINE HCL 100 MG/ML IJ SOLN: 100.0000 mg | Freq: Every day | INTRAMUSCULAR | Status: DC

## 2022-04-11 MED ORDER — ONDANSETRON HCL 4 MG/2ML IJ SOLN
4.0000 mg | INTRAMUSCULAR | Status: AC
  Administered 2022-04-11: 4 mg via INTRAVENOUS
  Filled 2022-04-11: qty 2

## 2022-04-11 MED ORDER — LACTATED RINGERS IV SOLN
INTRAVENOUS | Status: DC
Start: 2022-04-11 — End: 2022-04-14

## 2022-04-11 MED ORDER — ADULT MULTIVITAMIN W/MINERALS CH
1.0000 | ORAL_TABLET | Freq: Every day | ORAL | Status: DC
  Filled 2022-04-11: qty 1

## 2022-04-11 MED ORDER — MORPHINE SULFATE (PF) 2 MG/ML IV SOLN
2.0000 mg | INTRAVENOUS | Status: DC | PRN
  Administered 2022-04-11: 2 mg via INTRAVENOUS
  Filled 2022-04-11: qty 1

## 2022-04-11 MED ORDER — FOLIC ACID 1 MG PO TABS
1.0000 mg | ORAL_TABLET | Freq: Every day | ORAL | Status: DC
  Filled 2022-04-11: qty 1

## 2022-04-11 MED ORDER — ACETAMINOPHEN 325 MG PO TABS: 650.0000 mg | ORAL_TABLET | Freq: Four times a day (QID) | ORAL | Status: DC | PRN

## 2022-04-11 MED ORDER — ADULT MULTIVITAMIN W/MINERALS CH
1.0000 | ORAL_TABLET | Freq: Every day | ORAL | Status: DC
  Administered 2022-04-11 – 2022-04-14 (×4): 1 via ORAL
  Filled 2022-04-11 (×3): qty 1

## 2022-04-11 MED ORDER — THIAMINE HCL 100 MG PO TABS
100.0000 mg | ORAL_TABLET | Freq: Every day | ORAL | Status: DC
  Administered 2022-04-11 – 2022-04-14 (×4): 100 mg via ORAL
  Filled 2022-04-11 (×3): qty 1

## 2022-04-11 MED ORDER — MORPHINE SULFATE (PF) 2 MG/ML IV SOLN
2.0000 mg | INTRAVENOUS | Status: DC | PRN
  Administered 2022-04-11 – 2022-04-12 (×5): 2 mg via INTRAVENOUS
  Filled 2022-04-11 (×5): qty 1

## 2022-04-11 MED ORDER — LABETALOL HCL 5 MG/ML IV SOLN
10.0000 mg | INTRAVENOUS | Status: DC | PRN
  Administered 2022-04-12 (×2): 10 mg via INTRAVENOUS
  Filled 2022-04-11 (×2): qty 4

## 2022-04-11 MED ORDER — ENOXAPARIN SODIUM 40 MG/0.4ML IJ SOSY: 40.0000 mg | PREFILLED_SYRINGE | INTRAMUSCULAR | Status: DC

## 2022-04-11 NOTE — Progress Notes (Signed)
Admission profile updated. ?

## 2022-04-11 NOTE — Assessment & Plan Note (Deleted)
-   This seems to be stable not requiring any drainage and without evidence of pancreatic necrosis. - It would need to be followed in the future

## 2022-04-11 NOTE — Assessment & Plan Note (Addendum)
Lactic acidosis. 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 Presents to the hospital with complaints of abdominal pain. Patient does have history of alcohol induced pancreatitis. Patient was sober but appears to have started to drink alcohol again. Drinks half a case of beer on a daily basis and occasionally full case. Last drink was 1 day prior to admission. Lipase level was elevated.  LFTs stable. Outpatient follow-up with GI recommended as the pancreatic cyst still remains present. No indication for urgent drainage for now. Reduce the fluid rate.  Advance to heart healthy diet.  Monitor overnight

## 2022-04-11 NOTE — ED Triage Notes (Signed)
Pt to triage via wheelchair with c/o abd pain and back pain. Pt reports " I think Im having another pancreas attack." Reports pain became worse after drinking water and lying down. States 3 episodes of emesis tonight. Pain is sharp in nature, constant. Hx of pancreatitis.

## 2022-04-11 NOTE — ED Notes (Signed)
Patient appears to be sleeping in stretcher at this time. Respirations even and unlabored. NAD noted at this time.

## 2022-04-11 NOTE — H&P (Signed)
Home     [place]   PATIENT NAME: Shane Jenkins    MR#:  [id number]  DATE OF BIRTH:  [birth year]  DATE OF ADMISSION:  04/11/2022  PRIMARY CARE PHYSICIAN: [facility]   Patient is coming from: Home  REQUESTING/REFERRING PHYSICIAN: Loleta Rose, MD  CHIEF COMPLAINT:   Chief Complaint  Patient presents with   Abdominal Pain    HISTORY OF PRESENT ILLNESS:  Shane Sigmon Sr. is a 36 y.o. male with medical history significant for alcohol abuse, and previous history of pancreatitis, presented to emergency room medical onset of epigastric abdominal pain with associated nausea and vomiting since yesterday.  His last alcoholic drink was yesterday.  He admitted to drinking to excess 40 ounce alcoholic drink.  He denies any bilious vomitus or hematemesis or diarrhea.  He admitted to chills without fever.  No cough or wheezing or dyspnea or chest pain or palpitations.  No dysuria, oliguria or hematuria or flank pain.  No other bleeding diathesis.  ED Course: When he came to the ER BP was 174/116 with heart rate 116 and respirate of 18.  Labs revealed a CO2 of 21 with calcium of 8.8.  Serum lipase was 2607 and AST 78 ALT of 25 and total bili 1.2.  High sensitive troponin outstanding.  His lactic acid was 3.4 and later 4.1.  Procalcitonin was less than 0.1.  CBC was within normal. EKG as reviewed by me : .  EKG showed normal sinus rhythm with a rate of 93 with left atrial enlargement, RSR-in V1 or V2 LVH with early repolarization and anterolaterally. Imaging: Abdominal pelvic CT scan revealed the following: Recurrent inflammation surrounding the Pancreas is moderate, similar to that in January.   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 Hemorrhage into the pre-existing pseudocyst is possible. Recommend imaging follow-up.   No overt pancreatic necrosis. No regional vascular  thrombosis. Secondary inflammation of regional abdominal viscera. Trace free fluid in the greater omentum. No clear drainable fluid collection at this time.  The patient will is given 2 L bolus of IV lactated Ringer, 15 mg of IV Toradol, 4 mg of IV morphine sulfate and 4 mg of IV Zofran.  He will be admitted to a medical telemetry bed for further evaluation and management.  PAST MEDICAL HISTORY:   Past Medical History:  Diagnosis Date   Alcohol use disorder, severe, dependence (HCC)     PAST SURGICAL HISTORY:  No past surgical history on file.  He denies any previous surgeries.  SOCIAL HISTORY:   Social History   Tobacco Use   Smoking status: Every Day   Smokeless tobacco: Never  Substance Use Topics   Alcohol use: Not Currently    Comment: none since June 2021  He drinks alcohol on a regular basis.  FAMILY HISTORY:   Family History  Problem Relation Age of Onset   Alcohol abuse Mother     DRUG ALLERGIES:   Allergies  Allergen Reactions   Oxycodone Rash    Rash/itching which required benadryl to resolve   Sudafed [Pseudoephedrine]     History per pt of this being linked with his seizures    REVIEW OF SYSTEMS:   ROS As per history of present illness. All pertinent systems were reviewed above. Constitutional, HEENT, cardiovascular, respiratory, GI, GU, musculoskeletal, neuro, psychiatric, endocrine, integumentary and hematologic systems were reviewed and are otherwise negative/unremarkable except for positive  findings mentioned above in the HPI.   MEDICATIONS AT HOME:   Prior to Admission medications   Medication Sig Start Date End Date Taking? Authorizing Provider  acetaminophen (TYLENOL) 500 MG tablet Take 500 mg by mouth every 6 (six) hours as needed for headache, moderate pain or mild pain.    [provider]  ascorbic acid (VITAMIN C) 500 MG tablet Take 1 tablet (500 mg total) by mouth 2 (two) times daily. Patient not taking: Reported on 12/04/2021  05/31/20   Arnetha Courser, MD  DULoxetine (CYMBALTA) 20 MG capsule Take 1 capsule (20 mg total) by mouth daily. Patient not taking: Reported on 12/04/2021 06/01/20   Arnetha Courser, MD  ferrous fumarate-b12-vitamic C-folic acid (TRINSICON / FOLTRIN) capsule Take 1 capsule by mouth 2 (two) times daily after a meal. Patient not taking: Reported on 12/04/2021 05/31/20   Arnetha Courser, MD  gabapentin (NEURONTIN) 100 MG capsule Take 200 mg by mouth in the morning and at bedtime. Patient not taking: Reported on 12/04/2021 12/25/20   [provider]  Multiple Vitamin (MULTIVITAMIN WITH MINERALS) TABS tablet Take 1 tablet by mouth daily. Patient not taking: Reported on 12/04/2021 06/01/20   Arnetha Courser, MD  OLANZapine zydis (ZYPREXA) 5 MG disintegrating tablet Take 1 tablet (5 mg total) by mouth at bedtime. Patient not taking: Reported on 12/04/2021 05/31/20   Arnetha Courser, MD  pantoprazole (PROTONIX) 40 MG tablet Take 1 tablet (40 mg total) by mouth daily. Patient not taking: Reported on 12/04/2021 06/01/20   Arnetha Courser, MD  rivaroxaban (XARELTO) 20 MG TABS tablet Take 1 tablet (20 mg total) by mouth daily with supper. Patient not taking: Reported on 12/04/2021 05/31/20   Arnetha Courser, MD  rOPINIRole (REQUIP) 0.5 MG tablet Take 0.5 mg by mouth at bedtime. Patient not taking: Reported on 12/04/2021 12/04/20   [provider]  traZODone (DESYREL) 50 MG tablet Take 50 mg by mouth at bedtime. Patient not taking: Reported on 12/04/2021 10/15/20   [provider]  vitamin A 3 MG (10000 UNITS) capsule Take 1 capsule (10,000 Units total) by mouth daily. Patient not taking: Reported on 12/04/2021 05/31/20   Arnetha Courser, MD  Vitamin D, Ergocalciferol, (DRISDOL) 1.25 MG (50000 UNIT) CAPS capsule Take 1 capsule (50,000 Units total) by mouth every 7 (seven) days. Patient not taking: Reported on 12/04/2021 06/06/20   Arnetha Courser, MD      VITAL SIGNS:  Blood pressure (!) 180/107, pulse 77,  temperature 98.6 F (37 C), temperature source Oral, resp. rate 18, height 6\' 1"  (1.854 m), weight 68 kg, SpO2 98 %.  PHYSICAL EXAMINATION:  Physical Exam  GENERAL:  36 y.o.-year-old Caucasian male patient lying in the bed with no acute distress.  EYES: Pupils equal, round, reactive to light and accommodation. No scleral icterus. Extraocular muscles intact.  HEENT: Head atraumatic, normocephalic. Oropharynx and nasopharynx clear.  NECK:  Supple, no jugular venous distention. No thyroid enlargement, no tenderness.  LUNGS: Normal breath sounds bilaterally, no wheezing, rales,rhonchi or crepitation. No use of accessory muscles of respiration.  CARDIOVASCULAR: Regular rate and rhythm, S1, S2 normal. No murmurs, rubs, or gallops.  ABDOMEN: Soft, nondistended, with epigastric, left upper quadrant and to less extent right upper quadrant tenderness without rebound tenderness guarding or rigidity.  Bowel sounds present. No organomegaly or mass.  EXTREMITIES: No pedal edema, cyanosis, or clubbing.  NEUROLOGIC: Cranial nerves II through XII are intact. Muscle strength 5/5 in all extremities. Sensation intact. Gait not checked.  PSYCHIATRIC: The patient is  alert and oriented x 3.  Normal affect and good eye contact. SKIN: No obvious rash, lesion, or ulcer.   LABORATORY PANEL:   CBC Recent Labs  Lab 04/11/22 0311  WBC 8.0  HGB 14.1  HCT 41.2  PLT 91*   ------------------------------------------------------------------------------------------------------------------  Chemistries  Recent Labs  Lab 04/11/22 0311  NA 138  K 4.4  CL 105  CO2 21*  GLUCOSE 104*  BUN 8  CREATININE 0.95  CALCIUM 8.8*  AST 78*  ALT 25  ALKPHOS 67  BILITOT 1.2   ------------------------------------------------------------------------------------------------------------------  Cardiac Enzymes No results for input(s): TROPONINI in the last 168  hours. ------------------------------------------------------------------------------------------------------------------  RADIOLOGY:  CT ABDOMEN PELVIS W CONTRAST  Result Date: 04/11/2022 CLINICAL DATA:  36 year old male with abdominal and back pain. Postprandial pain, history of pancreatitis. EXAM: CT ABDOMEN AND PELVIS WITH CONTRAST TECHNIQUE: Multidetector CT imaging of the abdomen and pelvis was performed using the standard protocol following bolus administration of intravenous contrast. RADIATION DOSE REDUCTION: This exam was performed according to the departmental dose-optimization program which includes automated exposure control, adjustment of the mA and/or kV according to patient size and/or use of iterative reconstruction technique. CONTRAST:  100mL OMNIPAQUE IOHEXOL 300 MG/ML  SOLN COMPARISON:  CT Abdomen and Pelvis 12/03/2021 and earlier. FINDINGS: Lower chest: Negative aside from questionable pulmonary hyperinflation. Hepatobiliary: Inflammation at the porta hepatis, but the liver and gallbladder appear to remain normal. No biliary ductal dilatation. Pancreas: Confluent inflammation surrounding the pancreas in the lesser sac, very similar to the appearance in January. There is dilatation of the main pancreatic duct in the head, and around 2.9 cm isodense mass in the pancreatic uncinate now (series 2, image 25, which has very indistinct margins on coronal image 38 and is at least 3 cm craniocaudal. This is roughly 70 Hounsfield units. Multilocular pseudocyst was previously present in this area including in 2021. There has been some atrophy of the pancreatic tail since 2021. No overt pancreatic necrosis at this time. Spleen: Mild secondary inflammation at the splenic hilum but otherwise negative. Adrenals/Urinary Tract: Negative.  Nonobstructed kidneys. Stomach/Bowel: Negative large bowel. Gas-filled but nondilated distal small bowel might reflect a degree of ileus. Inflammation in the lesser sac  secondarily affects the stomach and duodenum. The proximal duodenum is mildly dilated and tapers as it crosses the midline. No free air. Trace free fluid at the greater omentum. Vascular/Lymphatic: Major arterial structures in the abdomen and pelvis remain patent with no significant lymphadenopathy. Inflammation surrounding the celiac and SMA which remain patent. Splenic vein and portal venous system also remain patent despite lesser sac inflammation. No lymphadenopathy identified. Reproductive: Negative. Other: No pelvic free fluid. Musculoskeletal: Negative. IMPRESSION: Recurrent inflammation surrounding the Pancreas is moderate, similar to that in January. 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 Hemorrhage into the pre-existing pseudocyst is possible. Recommend imaging follow-up. No overt pancreatic necrosis. No regional vascular thrombosis. Secondary inflammation of regional abdominal viscera. Trace free fluid in the greater omentum. No clear drainable fluid collection at this time. Electronically Signed   By: Odessa FlemingH  Hall M.D.   On: 04/11/2022 05:17      IMPRESSION AND PLAN:  Assessment and Plan: * Pancreatitis, acute - The patient will be admitted to a medical telemetry bed. - We will follow serial lipase levels. She will be kept NPO. - Pain management will be provided. - He will be hydrated with IV normal saline and a banana bag  daily.  Hypertensive urgency - He will be placed on as needed IV labetalol. - Pain management to be provided. - If he has persistent elevated BP with adequate pain management and may need to be started on antihypertensive therapy.  Pancreatic cyst - This seems to be stable not requiring any drainage and without evidence of pancreatic necrosis. - It would need to be followed in the future    DVT prophylaxis: Lovenox.  Advanced Care Planning:  Code Status: full code.  Family  Communication:  The plan of care was discussed in details with the patient (and family). I answered all questions. The patient agreed to proceed with the above mentioned plan. Further management will depend upon hospital course. Disposition Plan: Back to previous home environment Consults called: none.  All the records are reviewed and case discussed with ED provider.  Status is: Inpatient  At the time of the admission, it appears that the appropriate admission status for this patient is inpatient.  This is judged to be reasonable and necessary in order to provide the required intensity of service to ensure the patient's safety given the presenting symptoms, physical exam findings and initial radiographic and laboratory data in the context of comorbid conditions.  The patient requires inpatient status due to high intensity of service, high risk of further deterioration and high frequency of surveillance required.  I certify that at the time of admission, it is my clinical judgment that the patient will require inpatient hospital care extending more than 2 midnights.                            Dispo: The patient is from: Home              Anticipated d/c is to: Home              Patient currently is not medically stable to d/c.              Difficult to place patient: No  Hannah Beat M.D on 04/11/2022 at 9:24 AM  [facility]   From 7 PM-7 AM, contact night-coverage [email or web address]  CC: Primary care physician; [facility]

## 2022-04-11 NOTE — ED Notes (Signed)
Informed RN bed assigned 

## 2022-04-11 NOTE — ED Notes (Signed)
Pt to CT

## 2022-04-11 NOTE — Assessment & Plan Note (Signed)
Blood pressure elevated. As needed labetalol.

## 2022-04-11 NOTE — Progress Notes (Addendum)
[facility] PLAN OF CARE NOTE Patient: Shane Jenkins SHF:[id number]   PCP: [facility] DOB: 1986/09/17   DOA: 04/11/2022   DOS: 04/11/2022    Patient was admitted by my colleague earlier on 04/11/2022. I have reviewed the H&P as well as assessment and plan and agree with the same. Important changes in the plan are listed below.  Plan of care: Principal Problem:   Pancreatitis, acute Active Problems:   Hypertensive urgency   Pancreatic cyst  Maintain NPO today, will consult Gastroenterology tomorrow if no improvement.   Lactic acid still elevated.  1 litre bolus LR given again  Increase rate and change fluids to LR at 150/hour.  Recheck labs at 1pm.   Thrombocytopenia  Recheck labs and further work up  Alcohol abuse Add ciwa protocol  Author: Lynden Oxford, MD [facility] 04/11/2022 9:35 AM   If 7PM-7AM, please contact night-coverage at [email or web address]

## 2022-04-11 NOTE — ED Provider Notes (Signed)
[facility] Provider Note    Event Date/Time   First MD Initiated Contact with Patient 04/11/22 0406     (approximate)   History   Abdominal Pain   HPI  Shane Baumler Sr. is a 36 y.o. male who has had multiple admissions to the hospital in the past for acute on chronic pancreatitis and has had prior issues of pancreatic pseudocyst (in 2022).  He was last admitted about 5 months ago for pancreatitis.  He  He presents tonight for acute onset and severe lower abdominal pain that wraps around both sides and goes into his back similar to prior episodes.  He says it is in the lower abdomen rather than the upper, but this is the way that pancreatitis usually presents for him.  He admits that he drank a 40 ounce alcoholic beverage earlier today prior to the onset of the pain.  He has had multiple episodes of nausea and vomiting.  He denies fever, chest pain, and shortness of breath.  Pain is severe and nonstop.     Physical Exam   Triage Vital Signs: ED Triage Vitals  Enc Vitals Group     BP 04/11/22 0307 (!) 174/116     Pulse Rate 04/11/22 0307 (!) 116     Resp 04/11/22 0307 18     Temp 04/11/22 0307 98.6 F (37 C)     Temp Source 04/11/22 0307 Oral     SpO2 04/11/22 0307 96 %     Weight 04/11/22 0309 68 kg (150 lb)     Height 04/11/22 0309 1.854 m (6\' 1" )     Head Circumference --      Peak Flow --      Pain Score 04/11/22 0309 10     Pain Loc --      Pain Edu? --      Excl. in GC? --     Most recent vital signs: Vitals:   04/11/22 0530 04/11/22 0600  BP: (!) 154/104 (!) 157/95  Pulse: 77 79  Resp: 18 18  Temp:    SpO2: 97% 99%     General: Awake, appears to be in severe pain. CV:  Good peripheral perfusion.  Normal heart sounds.  Regular rhythm, tachycardia. Resp:  Tachypnea due to pain but lungs are clear to auscultation bilaterally. Abd:  Somewhat malnourished body habitus.  No pulsatile abdominal masses nor bruits.  Severe tenderness to  palpation throughout the abdomen consistent with mild generalized peritonitis with some rebound and guarding.   ED Results / Procedures / Treatments   Labs (all labs ordered are listed, but only abnormal results are displayed) Labs Reviewed  LIPASE, BLOOD - Abnormal; Notable for the following components:      Result Value   Lipase 2,607 (*)    All other components within normal limits  COMPREHENSIVE METABOLIC PANEL - Abnormal; Notable for the following components:   CO2 21 (*)    Glucose, Bld 104 (*)    Calcium 8.8 (*)    AST 78 (*)    All other components within normal limits  CBC - Abnormal; Notable for the following components:   Platelets 91 (*)    All other components within normal limits  ETHANOL - Abnormal; Notable for the following components:   Alcohol, Ethyl (B) 157 (*)    All other components within normal limits  LACTIC ACID, PLASMA - Abnormal; Notable for the following components:   Lactic Acid, Venous 3.4 (*)  All other components within normal limits  LACTIC ACID, PLASMA  PROCALCITONIN  TROPONIN I (HIGH SENSITIVITY)     EKG  ED ECG REPORT I, Loleta Rose, the attending physician, personally viewed and interpreted this ECG.  Date: 04/11/2022 EKG Time: 4:21 AM Rate: 93 Rhythm: normal sinus rhythm QRS Axis: normal Intervals: normal ST/T Wave abnormalities: There is some ST segment elevation most notable in the anterior leads, most likely early repolarization/J-point elevation. Narrative Interpretation: no definitive evidence of acute ischemia; does not meet STEMI criteria.    RADIOLOGY CT consistent with acute pancreatitis, questionable changes within chronic pseudocyst.    PROCEDURES:  Critical Care performed: No  .1-3 Lead EKG Interpretation Performed by: Loleta Rose, MD Authorized by: Loleta Rose, MD     Interpretation: abnormal     ECG rate:  115   ECG rate assessment: tachycardic     Rhythm: sinus tachycardia     Ectopy: none      Conduction: normal     MEDICATIONS ORDERED IN ED: Medications  enoxaparin (LOVENOX) injection 40 mg (has no administration in time range)  0.9 %  sodium chloride infusion ( Intravenous New Bag/Given 04/11/22 6378)  acetaminophen (TYLENOL) tablet 650 mg (has no administration in time range)    Or  acetaminophen (TYLENOL) suppository 650 mg (has no administration in time range)  magnesium hydroxide (MILK OF MAGNESIA) suspension 30 mL (has no administration in time range)  ondansetron (ZOFRAN) tablet 4 mg (has no administration in time range)    Or  ondansetron (ZOFRAN) injection 4 mg (has no administration in time range)  traZODone (DESYREL) tablet 25 mg (has no administration in time range)  morphine (PF) 2 MG/ML injection 2 mg (2 mg Intravenous Given 04/11/22 0635)  lactated ringers bolus 1,000 mL (0 mLs Intravenous Stopped 04/11/22 0533)  ondansetron (ZOFRAN) injection 4 mg (4 mg Intravenous Given 04/11/22 0431)  morphine (PF) 4 MG/ML injection 4 mg (4 mg Intravenous Given 04/11/22 0432)  ketorolac (TORADOL) 30 MG/ML injection 15 mg (15 mg Intravenous Given 04/11/22 0431)  iohexol (OMNIPAQUE) 300 MG/ML solution 100 mL (100 mLs Intravenous Contrast Given 04/11/22 0446)  lactated ringers bolus 1,000 mL (1,000 mLs Intravenous New Bag/Given 04/11/22 0535)     IMPRESSION / MDM / ASSESSMENT AND PLAN / ED COURSE  I reviewed the triage vital signs and the nursing notes.                              Differential diagnosis includes, but is not limited to, acute pancreatitis, biliary colic, diverticulitis, appendicitis, AAA, ACS.  Patient's presentation is most consistent with acute presentation with potential threat to life or bodily function.  Patient is hypertensive and tachycardic; I suspect both may be due to the pain.  Physical exam is notable for mild generalized peritonitis.  This is most likely acute pancreatitis in the setting of using alcohol tonight, but other possibility of acute  intra-abdominal infection is not ruled out.  No chest pain or shortness of breath and generally reassuring EKG with no evidence of ischemia.  Labs ordered include CMP, lipase, ethanol, lactic acid, high-sensitivity troponin.  For symptom control, I ordered morphine 4 mg IV, Zofran 4 mg IV, and Toradol 15 mg IV.  I considered droperidol as an alternative to Zofran and as an adjuvant pain control agent with morphine, but the patient's QTc is 467 and droperidol may not be the ideal choice for him.  The patient is  on the cardiac monitor to evaluate for evidence of arrhythmia and/or significant heart rate changes.   Clinical Course as of 04/11/22 [id number]  Fri Apr 11, 2022  0426 Comprehensive metabolic panel(!) Comprehensive metabolic panel is reassuring with no evidence of renal dysfunction.  He has a slightly elevated AST at 78, electrolytes are within normal limits.  Normal anion gap.  Given the patient's severe discomfort and history, I am proceeding with CT abdomen/pelvis with IV contrast. [CF]  0427 CBC(!) CBC interpreted:  no leukocytosis, but thrombocytopenia with platelets of 91,000. [CF]  0459 Lipase(!): 2,607 Elevated lipase at 2607. [CF]  0524 Alcohol, Ethyl (B)(!): 157 Ethanol elevated at 157 [CF]  0524 Lactic Acid, Venous(!!): 3.4 Lactic acid is elevated at 3.4.  I do not believe this represents sepsis given the patient is afebrile and has no leukocytosis.  He has recurrent moderate severity pancreatitis on his CT scan and this does not appear to be an infectious process.  I ordered another 1L LR bolus and the lactic acid can be rechecked.  Given the patient's recurrent pancreatitis and severe pain, I am consulting the hospitalist for admission. [CF]  [id number] Added on a procalcitonin as well [CF]  0527 CT ABDOMEN PELVIS W CONTRAST I viewed and interpreted the patient's CT of the abdomen and pelvis.  I can see some inflammatory changes around the pancreas consistent with pancreatitis.   The radiologist confirmed this and also mentioned that there could be some new areas of hemorrhage within the chronic pseudocyst.  The patient may benefit from repeat imaging.  There are is some fluid present in the abdomen but nothing drainable. [CF]  [phone] Discussed case with Dr. Arville CareMansy who will admit.  Updated patient who agrees with the plan.  He also acknowledged that he had two 40-ounces, not one. [CF]    Clinical Course User Index [CF] Loleta RoseForbach, Haroldine Redler, MD     FINAL CLINICAL IMPRESSION(S) / ED DIAGNOSES   Final diagnoses:  Generalized abdominal pain  Thrombocytopenia (HCC)  Acute on chronic pancreatitis (HCC)  Alcoholic intoxication with complication (HCC)     Rx / DC Orders   ED Discharge Orders     None        Note:  This document was prepared using Dragon voice recognition software and may include unintentional dictation errors.   Loleta RoseForbach, Kimmarie Pascale, MD 04/11/22 [phone]

## 2022-04-12 LAB — LIPASE, BLOOD: Lipase: 325 U/L — ABNORMAL HIGH (ref 11–51)

## 2022-04-12 LAB — CBC WITH DIFFERENTIAL/PLATELET
Abs Immature Granulocytes: 0.02 10*3/uL (ref 0.00–0.07)
Basophils Absolute: 0 10*3/uL (ref 0.0–0.1)
Basophils Relative: 0 %
Eosinophils Absolute: 0.7 10*3/uL — ABNORMAL HIGH (ref 0.0–0.5)
Eosinophils Relative: 11 %
HCT: 38 % — ABNORMAL LOW (ref 39.0–52.0)
Hemoglobin: 12.8 g/dL — ABNORMAL LOW (ref 13.0–17.0)
Immature Granulocytes: 0 %
Lymphocytes Relative: 12 %
Lymphs Abs: 0.7 10*3/uL (ref 0.7–4.0)
MCH: 32.3 pg (ref 26.0–34.0)
MCHC: 33.7 g/dL (ref 30.0–36.0)
MCV: 96 fL (ref 80.0–100.0)
Monocytes Absolute: 0.3 10*3/uL (ref 0.1–1.0)
Monocytes Relative: 5 %
Neutro Abs: 4.1 10*3/uL (ref 1.7–7.7)
Neutrophils Relative %: 72 %
Platelets: 101 10*3/uL — ABNORMAL LOW (ref 150–400)
RBC: 3.96 MIL/uL — ABNORMAL LOW (ref 4.22–5.81)
RDW: 12.5 % (ref 11.5–15.5)
WBC: 5.8 10*3/uL (ref 4.0–10.5)
nRBC: 0 % (ref 0.0–0.2)

## 2022-04-12 LAB — MAGNESIUM: Magnesium: 1.6 mg/dL — ABNORMAL LOW (ref 1.7–2.4)

## 2022-04-12 LAB — COMPREHENSIVE METABOLIC PANEL
ALT: 13 U/L (ref 0–44)
AST: 24 U/L (ref 15–41)
Albumin: 3.1 g/dL — ABNORMAL LOW (ref 3.5–5.0)
Alkaline Phosphatase: 56 U/L (ref 38–126)
Anion gap: 9 (ref 5–15)
BUN: 7 mg/dL (ref 6–20)
CO2: 27 mmol/L (ref 22–32)
Calcium: 8.5 mg/dL — ABNORMAL LOW (ref 8.9–10.3)
Chloride: 98 mmol/L (ref 98–111)
Creatinine, Ser: 0.68 mg/dL (ref 0.61–1.24)
GFR, Estimated: >60 mL/min (ref >60)
Glucose, Bld: 86 mg/dL (ref 70–99)
Potassium: 3.8 mmol/L (ref 3.5–5.1)
Sodium: 134 mmol/L — ABNORMAL LOW (ref 135–145)
Total Bilirubin: 1.9 mg/dL — ABNORMAL HIGH (ref 0.3–1.2)
Total Protein: 5.4 g/dL — ABNORMAL LOW (ref 6.5–8.1)

## 2022-04-12 MED ORDER — MAGNESIUM SULFATE 2 GM/50ML IV SOLN
2.0000 g | Freq: Once | INTRAVENOUS | Status: AC
  Administered 2022-04-12: 2 g via INTRAVENOUS
  Filled 2022-04-12: qty 50

## 2022-04-12 MED ORDER — DOCUSATE SODIUM 100 MG PO CAPS
100.0000 mg | ORAL_CAPSULE | Freq: Two times a day (BID) | ORAL | Status: DC
  Administered 2022-04-12 – 2022-04-13 (×2): 100 mg via ORAL
  Filled 2022-04-12 (×4): qty 1

## 2022-04-12 MED ORDER — ENOXAPARIN SODIUM 40 MG/0.4ML IJ SOSY
40.0000 mg | PREFILLED_SYRINGE | INTRAMUSCULAR | Status: DC
Start: 2022-04-12 — End: 2022-04-14
  Administered 2022-04-12 – 2022-04-13 (×2): 40 mg via SUBCUTANEOUS
  Filled 2022-04-12 (×2): qty 0.4

## 2022-04-12 NOTE — TOC Progression Note (Signed)
Transition of Care (TOC) - Progression Note    Patient Details  Name: Shane Alviar Sr. MRN: [id number] Date of Birth: 10-Jun-1986  Transition of Care [facility]) CM/SW Contact  Bing Quarry, RN Phone Number: 04/12/2022, 4:16 PM  Clinical Narrative:: TOC for SA resources. Substance abuse information and resources added AVS, but also just printed more local resources to the unit printer. Notified unit RN. Gabriel Cirri RN CM        Expected Discharge Plan and Services                                                 Social Determinants of Health (SDOH) Interventions    Readmission Risk Interventions     View : No data to display.

## 2022-04-12 NOTE — Assessment & Plan Note (Signed)
Drinks half a case on a daily basis and occasionally full case of beer. Was sober in the past, started drinking again lately. Social worker consulted. Continue CIWA protocol.  Continue thiamine and vitamin B complex.

## 2022-04-12 NOTE — Progress Notes (Signed)
  Progress Note Patient: Shane Jenkins. KVQ:[id number] DOB: [birth year] DOA: 04/11/2022  DOS: the patient was seen and examined on 04/12/2022  Brief hospital course: 36 year old male with past medical history of alcohol abuse, prior pancreatitis, history of DVT presents to the hospital with complaint of abdominal pain.  Found to have acute pancreatitis again secondary to alcohol.  Assessment and Plan: * Alcohol induced acute pancreatitis Lactic acidosis. 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 Presents to the hospital with complaints of abdominal pain. Patient does have history of alcohol induced pancreatitis. Patient was sober but appears to have started to drink alcohol again. Drinks half a case of beer on a daily basis and occasionally full case. Last drink was 1 day prior to admission. Lipase level was elevated.  LFTs were stable. Currently receiving IV fluids. Tolerating clear liquid diet therefore will continue that for now. Outpatient follow-up with GI recommended as the pancreatic cyst still remains present. No indication for urgent drainage for now.  Alcohol use disorder, moderate, dependence (HCC) Drinks half a case on a daily basis and occasionally full case of beer. Was sober in the past, started drinking again lately. Social worker consulted. Continue CIWA protocol.  Continue thiamine and vitamin B complex.  Hypertensive urgency Blood pressure elevated. As needed labetalol.  Thrombocytopenia (HCC) Prior history of DVT. Not on any antiplatelet Platelet count remaining stable. Fibrinogen level borderline low, INR normal, no evidence of DIC or TTP.  For now monitor    Subjective: Abdominal pain still present but improving.  Tolerating liquid diet without any worsening abdominal pain.  Passing gas.  Physical Exam: Vitals:   04/12/22 0800 04/12/22 0919 04/12/22 1128 04/12/22 1609  BP: (!) 153/95 (!) 139/95 (!) 147/95 (!)  160/93  Pulse: 84 76 73 72  Resp: 18  16 20   Temp:  98.2 F (36.8 C) 98.3 F (36.8 C) 98.2 F (36.8 C)  TempSrc:      SpO2: 100% 100% 98% 100%  Weight:      Height:       General: Appear in mild distress; no visible Abnormal Neck Mass Or lumps, Conjunctiva normal Cardiovascular: S1 and S2 Present, no Murmur, Respiratory: good respiratory effort, Bilateral Air entry present and CTA, no Crackles, no wheezes Abdomen: Bowel Sound present, diffusely tender Extremities: no Pedal edema Neurology: alert and no focal deficit, oriented, Gait not checked due to patient safety concerns   Data Reviewed: I have Reviewed nursing notes, Vitals, and Lab results since pt's last encounter. Pertinent lab results CBC and BMP lipase I have ordered test including CBC and BMP    Family Communication: None at bedside  Disposition: Status is: Inpatient Remains inpatient appropriate because: Needing IV hydration, pancreatitis still an issue.  Author: , MD 04/12/2022 6:07 PM  Please look on [email or web address] to find out who is on call.

## 2022-04-12 NOTE — Hospital Course (Signed)
36 year old male with past medical history of alcohol abuse, prior pancreatitis, history of DVT presents to the hospital with complaint of abdominal pain.  Found to have acute pancreatitis again secondary to alcohol.

## 2022-04-12 NOTE — Assessment & Plan Note (Addendum)
Prior history of DVT. Not on any antiplatelet Platelet count remaining stable. Fibrinogen level borderline low, INR normal, no evidence of DIC or TTP.  For now monitor

## 2022-04-13 LAB — CBC WITH DIFFERENTIAL/PLATELET
Abs Immature Granulocytes: 0.02 10*3/uL (ref 0.00–0.07)
Basophils Absolute: 0 10*3/uL (ref 0.0–0.1)
Basophils Relative: 0 %
Eosinophils Absolute: 0.7 10*3/uL — ABNORMAL HIGH (ref 0.0–0.5)
Eosinophils Relative: 14 %
HCT: 36.8 % — ABNORMAL LOW (ref 39.0–52.0)
Hemoglobin: 12.6 g/dL — ABNORMAL LOW (ref 13.0–17.0)
Immature Granulocytes: 0 %
Lymphocytes Relative: 21 %
Lymphs Abs: 1.1 10*3/uL (ref 0.7–4.0)
MCH: 32.7 pg (ref 26.0–34.0)
MCHC: 34.2 g/dL (ref 30.0–36.0)
MCV: 95.6 fL (ref 80.0–100.0)
Monocytes Absolute: 0.4 10*3/uL (ref 0.1–1.0)
Monocytes Relative: 9 %
Neutro Abs: 2.8 10*3/uL (ref 1.7–7.7)
Neutrophils Relative %: 56 %
Platelets: 106 10*3/uL — ABNORMAL LOW (ref 150–400)
RBC: 3.85 MIL/uL — ABNORMAL LOW (ref 4.22–5.81)
RDW: 12.3 % (ref 11.5–15.5)
WBC: 5.1 10*3/uL (ref 4.0–10.5)
nRBC: 0 % (ref 0.0–0.2)

## 2022-04-13 LAB — LIPASE, BLOOD: Lipase: 111 U/L — ABNORMAL HIGH (ref 11–51)

## 2022-04-13 LAB — COMPREHENSIVE METABOLIC PANEL
ALT: 13 U/L (ref 0–44)
AST: 22 U/L (ref 15–41)
Albumin: 3.5 g/dL (ref 3.5–5.0)
Alkaline Phosphatase: 59 U/L (ref 38–126)
Anion gap: 6 (ref 5–15)
BUN: <5 mg/dL — ABNORMAL LOW (ref 6–20)
CO2: 27 mmol/L (ref 22–32)
Calcium: 9 mg/dL (ref 8.9–10.3)
Chloride: 105 mmol/L (ref 98–111)
Creatinine, Ser: 0.66 mg/dL (ref 0.61–1.24)
GFR, Estimated: >60 mL/min (ref >60)
Glucose, Bld: 96 mg/dL (ref 70–99)
Potassium: 3.6 mmol/L (ref 3.5–5.1)
Sodium: 138 mmol/L (ref 135–145)
Total Bilirubin: 1 mg/dL (ref 0.3–1.2)
Total Protein: 5.9 g/dL — ABNORMAL LOW (ref 6.5–8.1)

## 2022-04-13 LAB — MAGNESIUM: Magnesium: 2.1 mg/dL (ref 1.7–2.4)

## 2022-04-13 MED ORDER — GABAPENTIN 100 MG PO CAPS
100.0000 mg | ORAL_CAPSULE | Freq: Three times a day (TID) | ORAL | Status: DC
  Administered 2022-04-13 – 2022-04-14 (×3): 100 mg via ORAL
  Filled 2022-04-13 (×3): qty 1

## 2022-04-13 MED ORDER — MORPHINE SULFATE (PF) 2 MG/ML IV SOLN: 2.0000 mg | INTRAVENOUS | Status: DC | PRN

## 2022-04-13 MED ORDER — TRAMADOL HCL 50 MG PO TABS: 50.0000 mg | ORAL_TABLET | Freq: Four times a day (QID) | ORAL | Status: DC | PRN

## 2022-04-13 NOTE — Progress Notes (Signed)
  Progress Note Patient: Shane Beckstead Sr. [id number] DOB: [birth year] DOA: 04/11/2022  DOS: the patient was seen and examined on 04/13/2022  Brief hospital course: 36 year old male with past medical history of alcohol abuse, prior pancreatitis, history of DVT presents to the hospital with complaint of abdominal pain.  Found to have acute pancreatitis again secondary to alcohol. Assessment and Plan: * Alcohol induced acute pancreatitis Lactic acidosis. 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 Presents to the hospital with complaints of abdominal pain. Patient does have history of alcohol induced pancreatitis. Patient was sober but appears to have started to drink alcohol again. Drinks half a case of beer on a daily basis and occasionally full case. Last drink was 1 day prior to admission. Lipase level was elevated.  LFTs stable. Outpatient follow-up with GI recommended as the pancreatic cyst still remains present. No indication for urgent drainage for now. Reduce the fluid rate.  Advance to heart healthy diet.  Monitor overnight  Alcohol use disorder, moderate, dependence (HCC) Drinks half a case on a daily basis and occasionally full case of beer. Was sober in the past, started drinking again lately. Social worker consulted. Continue CIWA protocol.  Continue thiamine and vitamin B complex.  Hypertensive urgency Blood pressure elevated. As needed labetalol.  Thrombocytopenia (HCC) Prior history of DVT. Not on any antiplatelet Platelet count remaining stable. Fibrinogen level borderline low, INR normal, no evidence of DIC or TTP.  For now monitor  Subjective: No nausea no vomiting no fever no chills.  Continues to have some abdominal pain but improving.  Passing gas.  Tolerating liquid diets.  Physical Exam: Vitals:   04/12/22 1609 04/12/22 2020 04/13/22 0509 04/13/22 0808  BP: (!) 160/93 (!) 170/96 (!) 163/93 (!) 152/97  Pulse: 72 71 64  71  Resp: 20 16 16 17   Temp: 98.2 F (36.8 C) 98 F (36.7 C) 97.9 F (36.6 C) 98.2 F (36.8 C)  TempSrc:  Oral Oral Oral  SpO2: 100% 100% 100% 99%  Weight:      Height:       General: Appear in mild distress; no visible Abnormal Neck Mass Or lumps, Conjunctiva normal Cardiovascular: S1 and S2 Present, no Murmur, Respiratory: good respiratory effort, Bilateral Air entry present and CTA, no Crackles, no wheezes Abdomen: Bowel Sound present, minimal diffusely tender Extremities: no Pedal edema Neurology: alert and oriented to time, place, and person  Gait not checked due to patient safety concerns   Data Reviewed: I have Reviewed nursing notes, Vitals, and Lab results since pt's last encounter. Pertinent lab results CBC CMP and lipase I have ordered test including CBC BMP    Family Communication: None at bedside  Disposition: Status is: Inpatient Remains inpatient appropriate because: Ensuring tolerating oral diet before discharge in a patient with pancreatitis. Author: , MD 04/13/2022 3:23 PM  Please look on [email or web address] to find out who is on call.

## 2022-04-14 LAB — CBC WITH DIFFERENTIAL/PLATELET
Abs Immature Granulocytes: 0.02 10*3/uL (ref 0.00–0.07)
Basophils Absolute: 0 10*3/uL (ref 0.0–0.1)
Basophils Relative: 0 %
Eosinophils Absolute: 0.6 10*3/uL — ABNORMAL HIGH (ref 0.0–0.5)
Eosinophils Relative: 12 %
HCT: 38.5 % — ABNORMAL LOW (ref 39.0–52.0)
Hemoglobin: 13.2 g/dL (ref 13.0–17.0)
Immature Granulocytes: 0 %
Lymphocytes Relative: 32 %
Lymphs Abs: 1.6 10*3/uL (ref 0.7–4.0)
MCH: 32.5 pg (ref 26.0–34.0)
MCHC: 34.3 g/dL (ref 30.0–36.0)
MCV: 94.8 fL (ref 80.0–100.0)
Monocytes Absolute: 0.6 10*3/uL (ref 0.1–1.0)
Monocytes Relative: 12 %
Neutro Abs: 2.2 10*3/uL (ref 1.7–7.7)
Neutrophils Relative %: 44 %
Platelets: 102 10*3/uL — ABNORMAL LOW (ref 150–400)
RBC: 4.06 MIL/uL — ABNORMAL LOW (ref 4.22–5.81)
RDW: 12.2 % (ref 11.5–15.5)
WBC: 5 10*3/uL (ref 4.0–10.5)
nRBC: 0 % (ref 0.0–0.2)

## 2022-04-14 LAB — COMPREHENSIVE METABOLIC PANEL
ALT: 14 U/L (ref 0–44)
AST: 21 U/L (ref 15–41)
Albumin: 3.5 g/dL (ref 3.5–5.0)
Alkaline Phosphatase: 61 U/L (ref 38–126)
Anion gap: 7 (ref 5–15)
BUN: <5 mg/dL — ABNORMAL LOW (ref 6–20)
CO2: 27 mmol/L (ref 22–32)
Calcium: 9.2 mg/dL (ref 8.9–10.3)
Chloride: 104 mmol/L (ref 98–111)
Creatinine, Ser: 0.76 mg/dL (ref 0.61–1.24)
GFR, Estimated: >60 mL/min (ref >60)
Glucose, Bld: 97 mg/dL (ref 70–99)
Potassium: 3.5 mmol/L (ref 3.5–5.1)
Sodium: 138 mmol/L (ref 135–145)
Total Bilirubin: 0.8 mg/dL (ref 0.3–1.2)
Total Protein: 6.5 g/dL (ref 6.5–8.1)

## 2022-04-14 LAB — MAGNESIUM: Magnesium: 2 mg/dL (ref 1.7–2.4)

## 2022-04-14 MED ORDER — ADULT MULTIVITAMIN W/MINERALS CH: 1.0000 | ORAL_TABLET | Freq: Every day | ORAL | 0 refills | Status: DC

## 2022-04-14 MED ORDER — DOCUSATE SODIUM 100 MG PO CAPS: 100.0000 mg | ORAL_CAPSULE | Freq: Two times a day (BID) | ORAL | 0 refills | Status: DC

## 2022-04-14 MED ORDER — TRAMADOL-ACETAMINOPHEN 37.5-325 MG PO TABS: 1.0000 | ORAL_TABLET | ORAL | 0 refills | Status: AC | PRN

## 2022-04-14 MED ORDER — THIAMINE HCL 100 MG PO TABS: 100.0000 mg | ORAL_TABLET | Freq: Every day | ORAL | 0 refills | Status: DC

## 2022-04-14 MED ORDER — GABAPENTIN 100 MG PO CAPS: 100.0000 mg | ORAL_CAPSULE | Freq: Three times a day (TID) | ORAL | 0 refills | Status: DC

## 2022-04-14 MED ORDER — PANTOPRAZOLE SODIUM 40 MG PO TBEC: 40.0000 mg | DELAYED_RELEASE_TABLET | Freq: Every day | ORAL | 0 refills | Status: DC

## 2022-04-14 NOTE — Discharge Summary (Signed)
Physician Discharge Summary   Patient: Shane Reinholtz Sr. MRN: [id number] DOB: 23-Jan-1986  Admit date:     04/11/2022  Discharge date: 04/14/2022  Discharge Physician: Berle Mull  PCP: Pediatrics, [facility]  Recommendations at discharge: Follow-up with PCP Will require GI referral for recurrent pancreatitis Will require repeat imaging for pancreatic protocol MRI or CT  Discharge Diagnoses: Principal Problem:   Alcohol induced acute pancreatitis Active Problems:   Pancreatic cyst   Alcohol use disorder, moderate, dependence (HCC)   Hypertensive urgency   Thrombocytopenia (HCC)   History of DVT (deep vein thrombosis)   Hospital Course: 36 year old male with past medical history of alcohol abuse, prior pancreatitis, history of DVT presents to the hospital with complaint of abdominal pain.  Found to have acute pancreatitis again secondary to alcohol.  Assessment and Plan: * Alcohol induced acute pancreatitis Lactic acidosis. 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 Presents to the hospital with complaints of abdominal pain. Patient does have history of alcohol induced pancreatitis. Patient was sober but appears to have started to drink alcohol again. Drinks half a case of beer on a daily basis and occasionally full case. Last drink was 1 day prior to admission. Lipase level was elevated.  LFTs stable. Outpatient follow-up with GI recommended as the pancreatic cyst still remains present. No indication for urgent drainage for now. Tolerating heart healthy diet.  Alcohol use disorder, moderate, dependence ([place]) Drinks half a case on a daily basis and occasionally full case of beer. Was sober in the past, started drinking again lately. Social worker consulted. Continue thiamine and vitamin B complex.  Hypertensive urgency Blood pressure elevated.  Now improved.  Thrombocytopenia ([place]) Prior history of DVT. Not on any  antiplatelet Platelet count remaining stable. Fibrinogen level borderline low, INR normal, no evidence of DIC or TTP.  For now monitor  Pain control - [facility] Controlled Substance Reporting System database was reviewed. and patient was instructed, not to drive, operate heavy machinery, perform activities at heights, swimming or participation in water activities or provide baby-sitting services while on Pain, Sleep and Anxiety Medications; until their outpatient Physician has advised to do so again. Also recommended to not to take more than prescribed Pain, Sleep and Anxiety Medications.   Consultants: none Procedures performed:  none DISCHARGE MEDICATION: Allergies as of 04/14/2022       Reactions   Oxycodone Rash   Rash/itching which required benadryl to resolve   Sudafed [pseudoephedrine]    History per pt of this being linked with his seizures        Medication List     STOP taking these medications    ascorbic acid 500 MG tablet Commonly known as: VITAMIN C   DULoxetine 20 MG capsule Commonly known as: CYMBALTA   ferrous [id number] C-folic acid capsule Commonly known as: TRINSICON / FOLTRIN   OLANZapine zydis 5 MG disintegrating tablet Commonly known as: ZYPREXA   rivaroxaban 20 MG Tabs tablet Commonly known as: XARELTO   rOPINIRole 0.5 MG tablet Commonly known as: REQUIP   traZODone 50 MG tablet Commonly known as: DESYREL   vitamin A 3 MG (10000 UNITS) capsule   Vitamin D (Ergocalciferol) 1.25 MG (50000 UNIT) Caps capsule Commonly known as: DRISDOL       TAKE these medications    acetaminophen 500 MG tablet Commonly known as: TYLENOL Take 500 mg by mouth every 6 (six) hours as needed for headache, moderate pain or mild pain.  docusate sodium 100 MG capsule Commonly known as: COLACE Take 1 capsule (100 mg total) by mouth 2 (two) times daily.   gabapentin 100 MG capsule Commonly known as: NEURONTIN Take 1 capsule (100 mg total) by  mouth 3 (three) times daily. What changed:  how much to take when to take this   multivitamin with minerals Tabs tablet Take 1 tablet by mouth daily.   pantoprazole 40 MG tablet Commonly known as: PROTONIX Take 1 tablet (40 mg total) by mouth daily for 14 days.   thiamine 100 MG tablet Take 1 tablet (100 mg total) by mouth daily.   traMADol-acetaminophen 37.5-325 MG tablet Commonly known as: Ultracet Take 1 tablet by mouth every 4 (four) hours as needed for up to 5 days for moderate pain.        Follow-up Information     Pediatrics, [facility]. Schedule an appointment as soon as possible for a visit in 1 week(s).   Specialty: Family Medicine Why: Need GI referral, new 3 cm mass in pancreas Recommend repeat CT abdomen in 1 month. Contact information: [address] [place] [address] [phone]                Disposition: Home Diet recommendation: Cardiac diet  Discharge Exam: Vitals:   04/13/22 1524 04/13/22 2222 04/14/22 0452 04/14/22 0750  BP: (!) 142/91 (!) 141/94 (!) 142/97 (!) 152/101  Pulse: 75 79 66 76  Resp: 16 17 18    Temp: 98.3 F (36.8 C) 98 F (36.7 C) 98.1 F (36.7 C) 97.8 F (36.6 C)  TempSrc: Oral Oral  Oral  SpO2: 100% 100% 100% 100%  Weight:      Height:       General: Appear in mild distress; no visible Abnormal Neck Mass Or lumps, Conjunctiva normal Cardiovascular: S1 and S2 Present, no Murmur, Respiratory: good respiratory effort, Bilateral Air entry present and CTA, no Crackles, no wheezes Abdomen: Bowel Sound present, Non tender  Extremities: no Pedal edema Neurology: alert and oriented to time, place, and person  Gait not checked due to patient safety concerns Filed Weights   04/11/22 0309  Weight: 68 kg   Condition at discharge: stable  The results of significant diagnostics from this hospitalization (including imaging, microbiology, ancillary and laboratory) are listed below for reference.   Imaging  Studies: CT ABDOMEN PELVIS W CONTRAST  Result Date: 04/11/2022 CLINICAL DATA:  36 year old male with abdominal and back pain. Postprandial pain, history of pancreatitis. EXAM: CT ABDOMEN AND PELVIS WITH CONTRAST TECHNIQUE: Multidetector CT imaging of the abdomen and pelvis was performed using the standard protocol following bolus administration of intravenous contrast. RADIATION DOSE REDUCTION: This exam was performed according to the departmental dose-optimization program which includes automated exposure control, adjustment of the mA and/or kV according to patient size and/or use of iterative reconstruction technique. CONTRAST:  OMNIPAQUE IOHEXOL 300 MG/ML  SOLN COMPARISON:  CT Abdomen and Pelvis 12/03/2021 and earlier. FINDINGS: Lower chest: Negative aside from questionable pulmonary hyperinflation. Hepatobiliary: Inflammation at the porta hepatis, but the liver and gallbladder appear to remain normal. No biliary ductal dilatation. Pancreas: Confluent inflammation surrounding the pancreas in the lesser sac, very similar to the appearance in January. There is dilatation of the main pancreatic duct in the head, and around 2.9 cm isodense mass in the pancreatic uncinate now (series 2, image 25, which has very indistinct margins on coronal image 38 and is at least 3 cm craniocaudal. This is roughly 70 Hounsfield units.  Multilocular pseudocyst was previously present in this area including in 2021. There has been some atrophy of the pancreatic tail since 2021. No overt pancreatic necrosis at this time. Spleen: Mild secondary inflammation at the splenic hilum but otherwise negative. Adrenals/Urinary Tract: Negative.  Nonobstructed kidneys. Stomach/Bowel: Negative large bowel. Gas-filled but nondilated distal small bowel might reflect a degree of ileus. Inflammation in the lesser sac secondarily affects the stomach and duodenum. The proximal duodenum is mildly dilated and tapers as it crosses the midline. No free  air. Trace free fluid at the greater omentum. Vascular/Lymphatic: Major arterial structures in the abdomen and pelvis remain patent with no significant lymphadenopathy. Inflammation surrounding the celiac and SMA which remain patent. Splenic vein and portal venous system also remain patent despite lesser sac inflammation. No lymphadenopathy identified. Reproductive: Negative. Other: No pelvic free fluid. Musculoskeletal: Negative. IMPRESSION: Recurrent inflammation surrounding the Pancreas is moderate, similar to that in January. 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 Hemorrhage into the pre-existing pseudocyst is possible. Recommend imaging follow-up. No overt pancreatic necrosis. No regional vascular thrombosis. Secondary inflammation of regional abdominal viscera. Trace free fluid in the greater omentum. No clear drainable fluid collection at this time. Electronically Signed   By: Genevie Ann M.D.   On: 04/11/2022 05:17    Microbiology: Results for orders placed or performed during the hospital encounter of 12/02/21  Resp Panel by RT-PCR (Flu A&B, Covid) Nasopharyngeal Swab     Status: Abnormal   Collection Time: 12/03/21  2:01 AM   Specimen: Nasopharyngeal Swab; Nasopharyngeal(NP) swabs in vial transport medium  Result Value Ref Range Status   SARS Coronavirus 2 by RT PCR POSITIVE (A) NEGATIVE Final    Comment: (NOTE) SARS-CoV-2 target nucleic acids are DETECTED.  The SARS-CoV-2 RNA is generally detectable in upper respiratory specimens during the acute phase of infection. Positive results are indicative of the presence of the identified virus, but do not rule out bacterial infection or co-infection with other pathogens not detected by the test. Clinical correlation with patient history and other diagnostic information is necessary to determine patient infection status. The expected result is Negative.  Fact Sheet  for Patients: EntrepreneurPulse.com.au  Fact Sheet for Healthcare Providers: IncredibleEmployment.be  This test is not yet approved or cleared by the [place] FDA and  has been authorized for detection and/or diagnosis of SARS-CoV-2 by FDA under an Emergency Use Authorization (EUA).  This EUA will remain in effect (meaning this test can be used) for the duration of  the COVID-19 declaration under Section 564(b)(1) of the A ct, 21 U.S.C. section 360bbb-3(b)(1), unless the authorization is terminated or revoked sooner.     Influenza A by PCR NEGATIVE NEGATIVE Final   Influenza B by PCR NEGATIVE NEGATIVE Final    Comment: (NOTE) The Xpert Xpress SARS-CoV-2/FLU/RSV plus assay is intended as an aid in the diagnosis of influenza from Nasopharyngeal swab specimens and should not be used as a sole basis for treatment. Nasal washings and aspirates are unacceptable for Xpert Xpress SARS-CoV-2/FLU/RSV testing.  Fact Sheet for Patients: EntrepreneurPulse.com.au  Fact Sheet for Healthcare Providers: IncredibleEmployment.be  This test is not yet approved or cleared by the [place] FDA and has been authorized for detection and/or diagnosis of SARS-CoV-2 by FDA under an Emergency Use Authorization (EUA). This EUA will remain in effect (meaning this test can be used) for the duration of the COVID-19 declaration under Section 564(b)(1)  of the Act, 21 U.S.C. section 360bbb-3(b)(1), unless the authorization is terminated or revoked.  Performed at [facility], [address] [place], [place] [address]     Labs: CBC: Recent Labs  Lab 04/11/22 [phone] 04/11/22 1514 04/12/22 0412 04/13/22 0518 04/14/22 0342  WBC 8.0 7.5 5.8 5.1 5.0  NEUTROABS  --  6.3 4.1 2.8 2.2  HGB 14.1 14.0 12.8* 12.6* 13.2  HCT 41.2 40.8 38.0* 36.8* 38.5*  MCV 93.0 94.9 96.0 95.6 94.8  PLT 91* 116* 101* 106* 102*   Basic  Metabolic Panel: Recent Labs  Lab 04/11/22 0311 04/12/22 0412 04/13/22 0518 04/14/22 0342  NA 138 134* 138 138  K 4.4 3.8 3.6 3.5  CL 105 98 105 104  CO2 21* 27 27 27   GLUCOSE 104* 86 96 97  BUN 8 7 <5* <5*  CREATININE 0.95 0.68 0.66 0.76  CALCIUM 8.8* 8.5* 9.0 9.2  MG  --  1.6* 2.1 2.0   Liver Function Tests: Recent Labs  Lab 04/11/22 0311 04/12/22 0412 04/13/22 0518 04/14/22 0342  AST 78* 24 22 21   ALT 25 13 13 14   ALKPHOS 67 56 59 61  BILITOT 1.2 1.9* 1.0 0.8  PROT 7.6 5.4* 5.9* 6.5  ALBUMIN 4.4 3.1* 3.5 3.5   CBG: No results for input(s): GLUCAP in the last 168 hours.  Discharge time spent: greater than 30 minutes.  Signed: Berle Mull, MD [facility] 04/14/2022

## 2022-04-30 ENCOUNTER — Encounter: Payer: Self-pay | Admitting: Gastroenterology

## 2022-04-30 ENCOUNTER — Ambulatory Visit (INDEPENDENT_AMBULATORY_CARE_PROVIDER_SITE_OTHER): Payer: Medicaid Other | Admitting: Gastroenterology

## 2022-04-30 VITALS — BP 121/86 | HR 98 | Temp 97.4°F | Ht 73.0 in | Wt 145.0 lb

## 2022-04-30 DIAGNOSIS — K8689 Other specified diseases of pancreas: Secondary | ICD-10-CM

## 2022-04-30 DIAGNOSIS — K861 Other chronic pancreatitis: Secondary | ICD-10-CM | POA: Diagnosis not present

## 2022-04-30 NOTE — Patient Instructions (Signed)
CT scan schedule for August 12, 2022 nothing to eat or drink 4 hours prior and you must go the day before to pick up prep to drink

## 2022-04-30 NOTE — Progress Notes (Signed)
Gastroenterology Consultation  Referring Provider:     Rolly Salter, MD Primary Care Physician:  Pediatrics, [facility] Primary Gastroenterologist:  Dr. Servando Snare     Reason for Consultation:     Pancreatitis        HPI:   Shane Sherrin Sr. is a 36 y.o. y/o male referred for consultation & management of pancreatitis by Dr. Lenore Manner, [facility].  This patient comes in today to see me in the office after being seen by me back in 2021 for alcoholic pancreatitis with change in mental status and ascites.  At that time I was consulted for PEG tube placement but due to his ascites it was a contraindication.  The patient was then subsequently seen by Dr. Norma Fredrickson in 2022 and at that time he had not been drinking for 7 to 8 months but had developed a pseudocyst and was discharged at that time.  The patient was recently in the hospital and it was reported that he was back to drinking alcohol and the hospitalist had mentioned that he was drinking a half a case of beer a day and sometimes a full case of beer per day.  The patient had a repeat CT scan that showed:  IMPRESSION: Recurrent inflammation surrounding the Pancreas is moderate, similar to that in January.   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 Hemorrhage into the pre-existing pseudocyst is possible. Recommend imaging follow-up.   No overt pancreatic necrosis. No regional vascular thrombosis. Secondary inflammation of regional abdominal viscera. Trace free fluid in the greater omentum. No clear drainable fluid collection at this time.  The patient had presented to the emergency department with abdominal pain at that time.  The patient has been noted to have chronic thrombocytopenia.  It appears that the most recent CT scan did not show any ascites. The patient reports that he has not had any further drinking since discharge.   He also reports that he feels much better.  He does state that he has gone back to drinking the past due to depression.  He now feels that he is doing much better and feels good about himself without drinking.  He denies any diarrhea and reports little constipation.  There is no report of any black stools or bloody stools or unexplained weight loss.  Past Medical History:  Diagnosis Date   Alcohol use disorder, severe, dependence (HCC)     No past surgical history on file.  Prior to Admission medications   Medication Sig Start Date End Date Taking? Authorizing Provider  acetaminophen (TYLENOL) 500 MG tablet Take 500 mg by mouth every 6 (six) hours as needed for headache, moderate pain or mild pain.    [provider]  docusate sodium (COLACE) 100 MG capsule Take 1 capsule (100 mg total) by mouth 2 (two) times daily. 04/14/22   Shane Salter, MD  gabapentin (NEURONTIN) 100 MG capsule Take 1 capsule (100 mg total) by mouth 3 (three) times daily. 04/14/22   Shane Salter, MD  Multiple Vitamin (MULTIVITAMIN WITH MINERALS) TABS tablet Take 1 tablet by mouth daily. 04/14/22   Shane Salter, MD  pantoprazole (PROTONIX) 40 MG tablet Take 1 tablet (40 mg total) by mouth daily for 14 days. 04/14/22 04/28/22  Shane Salter, MD  thiamine 100 MG tablet Take 1 tablet (100 mg total) by mouth daily. 04/14/22   Allena Katz,  Zachery Conch, MD    Family History  Problem Relation Age of Onset   Alcohol abuse Mother      Social History   Tobacco Use   Smoking status: Every Day   Smokeless tobacco: Never  Substance Use Topics   Alcohol use: Not Currently    Comment: none since June 2021   Drug use: Not Currently    Allergies as of 04/30/2022 - Review Complete 04/30/2022  Allergen Reaction Noted   Oxycodone Rash 01/02/2021   Sudafed [pseudoephedrine]  05/03/2020    Review of Systems:    All systems reviewed and negative except where noted in HPI.   Physical Exam:  BP 121/86   Pulse 98   Temp (!)  97.4 F (36.3 C) (Oral)   Ht 6\' 1"  (1.854 m)   Wt 145 lb (65.8 kg)   BMI 19.13 kg/m  No LMP for male patient. General:   Alert,  Well-developed, well-nourished, pleasant and cooperative in NAD Head:  Normocephalic and atraumatic. Eyes:  Sclera clear, no icterus.   Conjunctiva pink. Ears:  Normal auditory acuity. Neck:  Supple; no masses or thyromegaly. Lungs:  Respirations even and unlabored.  Clear throughout to auscultation.   No wheezes, crackles, or rhonchi. No acute distress. Heart:  Regular rate and rhythm; no murmurs, clicks, rubs, or gallops. Abdomen:  Normal bowel sounds.  No bruits.  Soft, non-tender and non-distended without masses, hepatosplenomegaly or hernias noted.  No guarding or rebound tenderness.  Negative Carnett sign.   Rectal:  Deferred.  Pulses:  Normal pulses noted. Extremities:  No clubbing or edema.  No cyanosis. Neurologic:  Alert and oriented x3;  grossly normal neurologically. Skin:  Intact without significant lesions or rashes.  No jaundice. Lymph Nodes:  No significant cervical adenopathy. Psych:  Alert and cooperative. Normal mood and affect.  Imaging Studies: CT ABDOMEN PELVIS W CONTRAST  Result Date: 04/11/2022 CLINICAL DATA:  36 year old male with abdominal and back pain. Postprandial pain, history of pancreatitis. EXAM: CT ABDOMEN AND PELVIS WITH CONTRAST TECHNIQUE: Multidetector CT imaging of the abdomen and pelvis was performed using the standard protocol following bolus administration of intravenous contrast. RADIATION DOSE REDUCTION: This exam was performed according to the departmental dose-optimization program which includes automated exposure control, adjustment of the mA and/or kV according to patient size and/or use of iterative reconstruction technique. CONTRAST:  31 OMNIPAQUE IOHEXOL 300 MG/ML  SOLN COMPARISON:  CT Abdomen and Pelvis 12/03/2021 and earlier. FINDINGS: Lower chest: Negative aside from questionable pulmonary hyperinflation.  Hepatobiliary: Inflammation at the porta hepatis, but the liver and gallbladder appear to remain normal. No biliary ductal dilatation. Pancreas: Confluent inflammation surrounding the pancreas in the lesser sac, very similar to the appearance in January. There is dilatation of the main pancreatic duct in the head, and around 2.9 cm isodense mass in the pancreatic uncinate now (series 2, image 25, which has very indistinct margins on coronal image 38 and is at least 3 cm craniocaudal. This is roughly 70 Hounsfield units. Multilocular pseudocyst was previously present in this area including in 2021. There has been some atrophy of the pancreatic tail since 2021. No overt pancreatic necrosis at this time. Spleen: Mild secondary inflammation at the splenic hilum but otherwise negative. Adrenals/Urinary Tract: Negative.  Nonobstructed kidneys. Stomach/Bowel: Negative large bowel. Gas-filled but nondilated distal small bowel might reflect a degree of ileus. Inflammation in the lesser sac secondarily affects the stomach and duodenum. The proximal duodenum is mildly dilated and tapers as it crosses the  midline. No free air. Trace free fluid at the greater omentum. Vascular/Lymphatic: Major arterial structures in the abdomen and pelvis remain patent with no significant lymphadenopathy. Inflammation surrounding the celiac and SMA which remain patent. Splenic vein and portal venous system also remain patent despite lesser sac inflammation. No lymphadenopathy identified. Reproductive: Negative. Other: No pelvic free fluid. Musculoskeletal: Negative. IMPRESSION: Recurrent inflammation surrounding the Pancreas is moderate, similar to that in January. 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 Hemorrhage into the pre-existing pseudocyst is possible. Recommend imaging follow-up. No overt pancreatic necrosis. No regional vascular thrombosis. Secondary  inflammation of regional abdominal viscera. Trace free fluid in the greater omentum. No clear drainable fluid collection at this time. Electronically Signed   By: Odessa Fleming M.D.   On: 04/11/2022 05:17    Assessment and Plan:   Shane Mcneel Sr. is a 36 y.o. y/o male who comes in today with a history of recurrent pancreatitis and alcohol abuse.  The patient has stopped drinking and had been found on a CT scan to have a 3 cm lesion in his pancreas.  It was recommended by radiology to have a repeat follow-up imaging of the lesion without a interval recommended.  The patient will have a repeat CT scan of the pancreas in 4 months to see if organization or resolution has happened with the patient.  The patient has been explained the plan and agrees with it.    Midge Minium, MD. Clementeen Graham    Note: This dictation was prepared with Dragon dictation along with smaller phrase technology. Any transcriptional errors that result from this process are unintentional.

## 2022-07-02 ENCOUNTER — Other Ambulatory Visit: Payer: Self-pay

## 2022-07-02 ENCOUNTER — Emergency Department
Admission: EM | Admit: 2022-07-02 | Discharge: 2022-07-02 | Disposition: A | Discharge status: Home / Self Care | Payer: Medicaid Other | Attending: Emergency Medicine | Admitting: Emergency Medicine

## 2022-07-02 DIAGNOSIS — R55 Syncope and collapse: Secondary | ICD-10-CM | POA: Diagnosis not present

## 2022-07-02 DIAGNOSIS — R110 Nausea: Secondary | ICD-10-CM | POA: Insufficient documentation

## 2022-07-02 DIAGNOSIS — R531 Weakness: Secondary | ICD-10-CM | POA: Diagnosis not present

## 2022-07-02 DIAGNOSIS — R5383 Other fatigue: Secondary | ICD-10-CM | POA: Insufficient documentation

## 2022-07-02 DIAGNOSIS — R42 Dizziness and giddiness: Secondary | ICD-10-CM | POA: Insufficient documentation

## 2022-07-02 DIAGNOSIS — I10 Essential (primary) hypertension: Secondary | ICD-10-CM | POA: Diagnosis not present

## 2022-07-02 DIAGNOSIS — Z8616 Personal history of COVID-19: Secondary | ICD-10-CM | POA: Insufficient documentation

## 2022-07-02 DIAGNOSIS — Z20822 Contact with and (suspected) exposure to covid-19: Secondary | ICD-10-CM | POA: Insufficient documentation

## 2022-07-02 LAB — CBC WITH DIFFERENTIAL/PLATELET
Abs Immature Granulocytes: 0.01 10*3/uL (ref 0.00–0.07)
Basophils Absolute: 0 10*3/uL (ref 0.0–0.1)
Basophils Relative: 1 %
Eosinophils Absolute: 0.3 10*3/uL (ref 0.0–0.5)
Eosinophils Relative: 6 %
HCT: 41.3 % (ref 39.0–52.0)
Hemoglobin: 13.8 g/dL (ref 13.0–17.0)
Immature Granulocytes: 0 %
Lymphocytes Relative: 36 %
Lymphs Abs: 1.5 10*3/uL (ref 0.7–4.0)
MCH: 30.3 pg (ref 26.0–34.0)
MCHC: 33.4 g/dL (ref 30.0–36.0)
MCV: 90.6 fL (ref 80.0–100.0)
Monocytes Absolute: 0.3 10*3/uL (ref 0.1–1.0)
Monocytes Relative: 8 %
Neutro Abs: 2.1 10*3/uL (ref 1.7–7.7)
Neutrophils Relative %: 49 %
Platelets: 172 10*3/uL (ref 150–400)
RBC: 4.56 MIL/uL (ref 4.22–5.81)
RDW: 12.5 % (ref 11.5–15.5)
WBC: 4.2 10*3/uL (ref 4.0–10.5)
nRBC: 0 % (ref 0.0–0.2)

## 2022-07-02 LAB — SARS CORONAVIRUS 2 BY RT PCR: SARS Coronavirus 2 by RT PCR: NEGATIVE

## 2022-07-02 LAB — COMPREHENSIVE METABOLIC PANEL
ALT: 21 U/L (ref 0–44)
AST: 44 U/L — ABNORMAL HIGH (ref 15–41)
Albumin: 5 g/dL (ref 3.5–5.0)
Alkaline Phosphatase: 67 U/L (ref 38–126)
Anion gap: 10 (ref 5–15)
BUN: 8 mg/dL (ref 6–20)
CO2: 25 mmol/L (ref 22–32)
Calcium: 9.6 mg/dL (ref 8.9–10.3)
Chloride: 105 mmol/L (ref 98–111)
Creatinine, Ser: 0.95 mg/dL (ref 0.61–1.24)
GFR, Estimated: >60 mL/min (ref >60)
Glucose, Bld: 110 mg/dL — ABNORMAL HIGH (ref 70–99)
Potassium: 4.4 mmol/L (ref 3.5–5.1)
Sodium: 140 mmol/L (ref 135–145)
Total Bilirubin: 0.9 mg/dL (ref 0.3–1.2)
Total Protein: 7.7 g/dL (ref 6.5–8.1)

## 2022-07-02 LAB — TROPONIN I (HIGH SENSITIVITY): Troponin I (High Sensitivity): 3 ng/L (ref <18)

## 2022-07-02 MED ORDER — ONDANSETRON 4 MG PO TBDP: 4.0000 mg | ORAL_TABLET | Freq: Three times a day (TID) | ORAL | 0 refills | Status: DC | PRN

## 2022-07-02 MED ORDER — SODIUM CHLORIDE 0.9 % IV BOLUS
1000.0000 mL | Freq: Once | INTRAVENOUS | Status: AC
  Administered 2022-07-02: 1000 mL via INTRAVENOUS

## 2022-07-02 MED ORDER — ONDANSETRON HCL 4 MG/2ML IJ SOLN
4.0000 mg | Freq: Once | INTRAMUSCULAR | Status: AC
Start: 2022-07-02 — End: 2022-07-02
  Administered 2022-07-02: 4 mg via INTRAVENOUS
  Filled 2022-07-02: qty 2

## 2022-07-02 NOTE — ED Notes (Addendum)
Pt reports feeling "weak" still and would like to speak to MD, MD aware.

## 2022-07-02 NOTE — Discharge Instructions (Addendum)
Your blood work including your blood counts and electrolytes were all reassuring.  Please make sure you are staying hydrated.  I have prescribed you Zofran which you can take as needed for nausea.  If you are still feeling increasingly fatigued in several days please follow-up with your primary care provider.

## 2022-07-02 NOTE — ED Provider Notes (Signed)
[facility] Provider Note    Event Date/Time   First MD Initiated Contact with Patient 07/02/22 [phone]     (approximate)   History   Loss of Consciousness   HPI  Shane Bury Sr. is a 36 y.o. male  with past medical history of alcohol use disorder, hypertension DVT who presents after syncopal episode.  Patient was in the shower as he normally does when he takes a shower this morning when he felt dizzy and thinks he lost consciousness because when he woke up about 15 minutes and passed.  Denies any preceding chest pain shortness of breath or palpitations.  Notes that over the last week or so he is felt just generally weak with decreased appetite fatigue and some nausea.  Denies abdominal pain actual vomiting or diarrhea.  He is no longer drinking alcohol has been sober for about 2 months.  Nuys other drug use.  Denies any chest pain or shortness of breath.    Past Medical History:  Diagnosis Date   Alcohol use disorder, severe, dependence (HCC)     Patient Active Problem List   Diagnosis Date Noted   Alcohol induced acute pancreatitis 04/11/2022   Hypertensive urgency 04/11/2022   Pancreatic cyst 12/03/2021   Alcohol use disorder, moderate, dependence (HCC) 12/03/2021   History of DVT (deep vein thrombosis) 12/03/2021   Chronic anticoagulation 12/03/2021   Alcohol use disorder in remission 12/03/2021   Elevated blood-pressure reading, without diagnosis of hypertension 12/03/2021   COVID-19 virus infection 12/03/2021   Bilateral pleural effusion 01/09/2021   Malnutrition of moderate degree 01/03/2021   Acute pancreatitis 12/29/2020   Nicotine dependence 12/29/2020   RLS (restless legs syndrome) 09/12/2020   Pressure injury of skin 05/28/2020   Protein-calorie malnutrition, severe 05/22/2020   Hypotension    Abdominal ascites    Acute metabolic encephalopathy 05/10/2020   Severe malnutrition (HCC) 05/10/2020   Sinus tachycardia 05/10/2020   Delirium  tremens (HCC) 05/03/2020   Hyponatremia 05/03/2020   Hypokalemia 05/03/2020   AKI (acute kidney injury) (HCC) 05/03/2020   Elevated CK 05/03/2020   Heme positive stool 05/03/2020   Thrombocytopenia (HCC) 05/03/2020     Physical Exam  Triage Vital Signs: ED Triage Vitals  Enc Vitals Group     BP 07/02/22 0605 (!) 158/90     Pulse Rate 07/02/22 0605 85     Resp 07/02/22 0605 19     Temp 07/02/22 0605 97.8 F (36.6 C)     Temp Source 07/02/22 0605 Oral     SpO2 07/02/22 0605 100 %     Weight 07/02/22 0603 145 lb (65.8 kg)     Height 07/02/22 0603 6\' 1"  (1.854 m)     Head Circumference --      Peak Flow --      Pain Score 07/02/22 0602 0     Pain Loc --      Pain Edu? --      Excl. in [place]? --     Most recent vital signs: Vitals:   07/02/22 0605  BP: (!) 158/90  Pulse: 85  Resp: 19  Temp: 97.8 F (36.6 C)  SpO2: 100%     General: Awake, no distress.  Dry mucous membranes CV:  Good peripheral perfusion.  No edema Resp:  Normal effort.  Lungs are clear Abd:  No distention.  Abdomen is soft and nontender throughout Neuro:             Awake, Alert, Oriented  x 3  Other:     ED Results / Procedures / Treatments  Labs (all labs ordered are listed, but only abnormal results are displayed) Labs Reviewed  COMPREHENSIVE METABOLIC PANEL - Abnormal; Notable for the following components:      Result Value   Glucose, Bld 110 (*)    AST 44 (*)    All other components within normal limits  SARS CORONAVIRUS 2 BY RT PCR  CBC WITH DIFFERENTIAL/PLATELET  TROPONIN I (HIGH SENSITIVITY)     EKG  EKG shows normal sinus rhythm with normal axis normal intervals, benign appearing ST elevation in V2 V3   RADIOLOGY    PROCEDURES:  Critical Care performed: No  .1-3 Lead EKG Interpretation  Performed by: Georga Hacking, MD Authorized by: Georga Hacking, MD     Interpretation: normal     ECG rate assessment: normal     Rhythm: sinus rhythm     Ectopy: none      Conduction: normal     The patient is on the cardiac monitor to evaluate for evidence of arrhythmia and/or significant heart rate changes.   MEDICATIONS ORDERED IN ED: Medications  sodium chloride 0.9 % bolus 1,000 mL (1,000 mLs Intravenous New Bag/Given 07/02/22 0625)  ondansetron (ZOFRAN) injection 4 mg (4 mg Intravenous Given 07/02/22 0644)     IMPRESSION / MDM / ASSESSMENT AND PLAN / ED COURSE  I reviewed the triage vital signs and the nursing notes.                              Patient's presentation is most consistent with acute complicated illness / injury requiring diagnostic workup.  Differential diagnosis includes, but is not limited to, vasovagal syncope, orthostatic syncope, electrolyte abnormality, cardiac arrhythmia, anemia, hypovolemia  The patient is a 36 year old male presents after a presumed syncopal episode he was sitting in the shower taking a shower and then woke up several minutes later thinks he passed out but question whether he may have just fallen asleep.  Did feel dizzy when getting into the shower.  Denies any chest pain or shortness of breath over the last week or so he has had decreased appetite and fatigue have to leave work early also with some nausea but no vomiting or abdominal pain.  He is not drinking alcohol currently previously did drink heavily but has been sober for the last 2 months.  On exam he looks somewhat dry but otherwise exam is reassuring.  Plan to check labs including electrolytes and CBC.  EKG is not concerning for cardiogenic cause of syncope.  We will give a liter of fluid and Zofran.  Will check COVID test as well as his symptoms could be viral related.  CBC and CMP are reassuring troponin is negative.  No significant electrolyte abnormality anemia or kidney injury.  Suspect that this episode was in part due to vasodilatation for being in the shower and that there is also component of him being somewhat hypervolemic from poor p.o. intake.   Discussed maintaining hydration.  Will discharge with ODT Zofran.  Recommend he follow-up with his PCP if he still feeling increasing fatigue in several days.   FINAL CLINICAL IMPRESSION(S) / ED DIAGNOSES   Final diagnoses:  Syncope, unspecified syncope type     Rx / DC Orders   ED Discharge Orders          Ordered    ondansetron (ZOFRAN-ODT) 4 MG  disintegrating tablet  Every 8 hours PRN        07/02/22 4098             Note:  This document was prepared using Dragon voice recognition software and may include unintentional dictation errors.   Georga Hacking, MD 07/02/22 [phone]

## 2022-07-02 NOTE — ED Triage Notes (Signed)
Syncopal episode in shower this morning apx 5am. Pt denies fall was sitting down in shower when he had syncopal episode.  Pt felt dizzy yesterday with blurred vision, fatigue, body aches and loss of appetite/nausea.  Also states he has tingling in both arms.

## 2022-07-02 NOTE — ED Notes (Signed)
IV fluids infusing, pt with no needs. Alert and oriented at this time .

## 2022-08-12 ENCOUNTER — Ambulatory Visit: Admission: RE | Admit: 2022-08-12 | Payer: Medicaid Other | Source: Ambulatory Visit

## 2022-12-18 ENCOUNTER — Inpatient Hospital Stay
Admission: EM | Admit: 2022-12-18 | Discharge: 2022-12-27 | DRG: 438 | Disposition: A | Discharge status: Home / Self Care | Payer: Medicaid Other | Attending: Internal Medicine | Admitting: Internal Medicine

## 2022-12-18 ENCOUNTER — Emergency Department: Payer: Medicaid Other

## 2022-12-18 ENCOUNTER — Other Ambulatory Visit: Payer: Self-pay

## 2022-12-18 DIAGNOSIS — E860 Dehydration: Secondary | ICD-10-CM | POA: Diagnosis present

## 2022-12-18 DIAGNOSIS — I10 Essential (primary) hypertension: Secondary | ICD-10-CM | POA: Diagnosis present

## 2022-12-18 DIAGNOSIS — K859 Acute pancreatitis without necrosis or infection, unspecified: Secondary | ICD-10-CM | POA: Diagnosis present

## 2022-12-18 DIAGNOSIS — F17210 Nicotine dependence, cigarettes, uncomplicated: Secondary | ICD-10-CM | POA: Diagnosis present

## 2022-12-18 DIAGNOSIS — J101 Influenza due to other identified influenza virus with other respiratory manifestations: Secondary | ICD-10-CM | POA: Diagnosis present

## 2022-12-18 DIAGNOSIS — K219 Gastro-esophageal reflux disease without esophagitis: Secondary | ICD-10-CM | POA: Diagnosis present

## 2022-12-18 DIAGNOSIS — E876 Hypokalemia: Secondary | ICD-10-CM | POA: Diagnosis not present

## 2022-12-18 DIAGNOSIS — K8689 Other specified diseases of pancreas: Secondary | ICD-10-CM | POA: Diagnosis present

## 2022-12-18 DIAGNOSIS — K861 Other chronic pancreatitis: Secondary | ICD-10-CM | POA: Diagnosis not present

## 2022-12-18 DIAGNOSIS — K920 Hematemesis: Secondary | ICD-10-CM | POA: Diagnosis present

## 2022-12-18 DIAGNOSIS — K2921 Alcoholic gastritis with bleeding: Secondary | ICD-10-CM | POA: Diagnosis present

## 2022-12-18 DIAGNOSIS — E441 Mild protein-calorie malnutrition: Secondary | ICD-10-CM | POA: Diagnosis present

## 2022-12-18 DIAGNOSIS — D6959 Other secondary thrombocytopenia: Secondary | ICD-10-CM | POA: Diagnosis present

## 2022-12-18 DIAGNOSIS — E871 Hypo-osmolality and hyponatremia: Secondary | ICD-10-CM | POA: Diagnosis not present

## 2022-12-18 DIAGNOSIS — K860 Alcohol-induced chronic pancreatitis: Secondary | ICD-10-CM | POA: Diagnosis present

## 2022-12-18 DIAGNOSIS — Z1152 Encounter for screening for COVID-19: Secondary | ICD-10-CM

## 2022-12-18 DIAGNOSIS — Z681 Body mass index (BMI) 19 or less, adult: Secondary | ICD-10-CM | POA: Diagnosis not present

## 2022-12-18 DIAGNOSIS — K863 Pseudocyst of pancreas: Secondary | ICD-10-CM | POA: Diagnosis present

## 2022-12-18 DIAGNOSIS — Z885 Allergy status to narcotic agent status: Secondary | ICD-10-CM

## 2022-12-18 DIAGNOSIS — K922 Gastrointestinal hemorrhage, unspecified: Secondary | ICD-10-CM | POA: Diagnosis present

## 2022-12-18 DIAGNOSIS — D696 Thrombocytopenia, unspecified: Secondary | ICD-10-CM

## 2022-12-18 DIAGNOSIS — Z888 Allergy status to other drugs, medicaments and biological substances status: Secondary | ICD-10-CM | POA: Diagnosis not present

## 2022-12-18 DIAGNOSIS — F1021 Alcohol dependence, in remission: Secondary | ICD-10-CM | POA: Diagnosis present

## 2022-12-18 DIAGNOSIS — Z811 Family history of alcohol abuse and dependence: Secondary | ICD-10-CM

## 2022-12-18 DIAGNOSIS — K852 Alcohol induced acute pancreatitis without necrosis or infection: Principal | ICD-10-CM | POA: Diagnosis present

## 2022-12-18 DIAGNOSIS — Z79899 Other long term (current) drug therapy: Secondary | ICD-10-CM

## 2022-12-18 LAB — RESP PANEL BY RT-PCR (RSV, FLU A&B, COVID)  RVPGX2
Influenza A by PCR: POSITIVE — AB
Influenza B by PCR: NEGATIVE
Resp Syncytial Virus by PCR: NEGATIVE
SARS Coronavirus 2 by RT PCR: NEGATIVE

## 2022-12-18 LAB — URINALYSIS, ROUTINE W REFLEX MICROSCOPIC
Bacteria, UA: NONE SEEN
Bilirubin Urine: NEGATIVE
Glucose, UA: 50 mg/dL — AB
Hgb urine dipstick: NEGATIVE
Ketones, ur: 20 mg/dL — AB
Leukocytes,Ua: NEGATIVE
Nitrite: NEGATIVE
Protein, ur: 30 mg/dL — AB
Specific Gravity, Urine: >1.046 — ABNORMAL HIGH (ref 1.005–1.030)
pH: 6 (ref 5.0–8.0)

## 2022-12-18 LAB — OSMOLALITY: Osmolality: 283 mOsm/kg (ref 275–295)

## 2022-12-18 LAB — BASIC METABOLIC PANEL
Anion gap: 7 (ref 5–15)
Anion gap: 9 (ref 5–15)
BUN: 12 mg/dL (ref 6–20)
BUN: 10 mg/dL (ref 6–20)
CO2: 29 mmol/L (ref 22–32)
CO2: 24 mmol/L (ref 22–32)
Calcium: 8.1 mg/dL — ABNORMAL LOW (ref 8.9–10.3)
Calcium: 7.9 mg/dL — ABNORMAL LOW (ref 8.9–10.3)
Chloride: 93 mmol/L — ABNORMAL LOW (ref 98–111)
Chloride: 97 mmol/L — ABNORMAL LOW (ref 98–111)
Creatinine, Ser: 0.77 mg/dL (ref 0.61–1.24)
Creatinine, Ser: 0.82 mg/dL (ref 0.61–1.24)
GFR, Estimated: >60 mL/min (ref >60)
GFR, Estimated: >60 mL/min (ref >60)
Glucose, Bld: 145 mg/dL — ABNORMAL HIGH (ref 70–99)
Glucose, Bld: 116 mg/dL — ABNORMAL HIGH (ref 70–99)
Potassium: 4.2 mmol/L (ref 3.5–5.1)
Potassium: 3.7 mmol/L (ref 3.5–5.1)
Sodium: 129 mmol/L — ABNORMAL LOW (ref 135–145)
Sodium: 130 mmol/L — ABNORMAL LOW (ref 135–145)

## 2022-12-18 LAB — OSMOLALITY, URINE: Osmolality, Ur: 597 mOsm/kg (ref 300–900)

## 2022-12-18 LAB — COMPREHENSIVE METABOLIC PANEL
ALT: 18 U/L (ref 0–44)
AST: 35 U/L (ref 15–41)
Albumin: 4.2 g/dL (ref 3.5–5.0)
Alkaline Phosphatase: 59 U/L (ref 38–126)
Anion gap: 18 — ABNORMAL HIGH (ref 5–15)
BUN: 13 mg/dL (ref 6–20)
CO2: 20 mmol/L — ABNORMAL LOW (ref 22–32)
Calcium: 8.5 mg/dL — ABNORMAL LOW (ref 8.9–10.3)
Chloride: 90 mmol/L — ABNORMAL LOW (ref 98–111)
Creatinine, Ser: 0.89 mg/dL (ref 0.61–1.24)
GFR, Estimated: >60 mL/min (ref >60)
Glucose, Bld: 196 mg/dL — ABNORMAL HIGH (ref 70–99)
Potassium: 3.7 mmol/L (ref 3.5–5.1)
Sodium: 128 mmol/L — ABNORMAL LOW (ref 135–145)
Total Bilirubin: 1 mg/dL (ref 0.3–1.2)
Total Protein: 7.7 g/dL (ref 6.5–8.1)

## 2022-12-18 LAB — TRIGLYCERIDES: Triglycerides: 47 mg/dL (ref <150)

## 2022-12-18 LAB — TYPE AND SCREEN
ABO/RH(D): A POS
Antibody Screen: NEGATIVE

## 2022-12-18 LAB — LIPASE, BLOOD: Lipase: 4265 U/L — ABNORMAL HIGH (ref 11–51)

## 2022-12-18 LAB — CBC
HCT: 46 % (ref 39.0–52.0)
Hemoglobin: 15.4 g/dL (ref 13.0–17.0)
MCH: 29.9 pg (ref 26.0–34.0)
MCHC: 33.5 g/dL (ref 30.0–36.0)
MCV: 89.3 fL (ref 80.0–100.0)
Platelets: 135 10*3/uL — ABNORMAL LOW (ref 150–400)
RBC: 5.15 MIL/uL (ref 4.22–5.81)
RDW: 12.6 % (ref 11.5–15.5)
WBC: 8.4 10*3/uL (ref 4.0–10.5)
nRBC: 0 % (ref 0.0–0.2)

## 2022-12-18 LAB — MAGNESIUM: Magnesium: 1.8 mg/dL (ref 1.7–2.4)

## 2022-12-18 LAB — PHOSPHORUS: Phosphorus: 3.2 mg/dL (ref 2.5–4.6)

## 2022-12-18 LAB — APTT: aPTT: 30 seconds (ref 24–36)

## 2022-12-18 LAB — PROTIME-INR
INR: 1 (ref 0.8–1.2)
Prothrombin Time: 12.7 seconds (ref 11.4–15.2)

## 2022-12-18 LAB — SODIUM, URINE, RANDOM: Sodium, Ur: <10 mmol/L

## 2022-12-18 MED ORDER — SODIUM CHLORIDE 1 G PO TABS
1.0000 g | ORAL_TABLET | Freq: Two times a day (BID) | ORAL | Status: DC
  Administered 2022-12-18 – 2022-12-23 (×10): 1 g via ORAL
  Filled 2022-12-18 (×10): qty 1

## 2022-12-18 MED ORDER — ONDANSETRON HCL 4 MG/2ML IJ SOLN
4.0000 mg | Freq: Three times a day (TID) | INTRAMUSCULAR | Status: DC | PRN
  Administered 2022-12-22 – 2022-12-26 (×3): 4 mg via INTRAVENOUS
  Filled 2022-12-18 (×4): qty 2

## 2022-12-18 MED ORDER — ONDANSETRON HCL 4 MG/2ML IJ SOLN
4.0000 mg | Freq: Once | INTRAMUSCULAR | Status: AC
  Administered 2022-12-18: 4 mg via INTRAVENOUS
  Filled 2022-12-18: qty 2

## 2022-12-18 MED ORDER — HYDROMORPHONE HCL 1 MG/ML IJ SOLN
1.0000 mg | Freq: Once | INTRAMUSCULAR | Status: AC
  Administered 2022-12-18: 1 mg via INTRAVENOUS
  Filled 2022-12-18: qty 1

## 2022-12-18 MED ORDER — ACETAMINOPHEN 325 MG PO TABS
650.0000 mg | ORAL_TABLET | Freq: Four times a day (QID) | ORAL | Status: DC | PRN
  Administered 2022-12-23 – 2022-12-27 (×3): 650 mg via ORAL
  Filled 2022-12-18 (×3): qty 2

## 2022-12-18 MED ORDER — LACTATED RINGERS IV BOLUS
1000.0000 mL | Freq: Once | INTRAVENOUS | Status: AC
  Administered 2022-12-18: 1000 mL via INTRAVENOUS

## 2022-12-18 MED ORDER — NICOTINE 21 MG/24HR TD PT24
21.0000 mg | MEDICATED_PATCH | Freq: Every day | TRANSDERMAL | Status: DC
  Administered 2022-12-18 – 2022-12-27 (×10): 21 mg via TRANSDERMAL
  Filled 2022-12-18 (×10): qty 1

## 2022-12-18 MED ORDER — ONDANSETRON 4 MG PO TBDP
4.0000 mg | ORAL_TABLET | Freq: Once | ORAL | Status: AC | PRN
  Administered 2022-12-24: 4 mg via ORAL
  Filled 2022-12-18: qty 1

## 2022-12-18 MED ORDER — FENTANYL CITRATE PF 50 MCG/ML IJ SOSY
25.0000 ug | PREFILLED_SYRINGE | INTRAMUSCULAR | Status: DC | PRN
  Administered 2022-12-18 – 2022-12-26 (×46): 25 ug via INTRAVENOUS
  Filled 2022-12-18 (×47): qty 1

## 2022-12-18 MED ORDER — MORPHINE SULFATE (PF) 4 MG/ML IV SOLN
4.0000 mg | Freq: Once | INTRAVENOUS | Status: AC
  Administered 2022-12-18: 4 mg via INTRAVENOUS
  Filled 2022-12-18: qty 1

## 2022-12-18 MED ORDER — SODIUM CHLORIDE 0.9 % IV SOLN: INTRAVENOUS | Status: DC

## 2022-12-18 MED ORDER — OSELTAMIVIR PHOSPHATE 75 MG PO CAPS
75.0000 mg | ORAL_CAPSULE | Freq: Two times a day (BID) | ORAL | Status: AC
  Administered 2022-12-18 – 2022-12-22 (×10): 75 mg via ORAL
  Filled 2022-12-18 (×11): qty 1

## 2022-12-18 MED ORDER — PANTOPRAZOLE SODIUM 40 MG IV SOLR
40.0000 mg | Freq: Once | INTRAVENOUS | Status: AC
  Administered 2022-12-18: 40 mg via INTRAVENOUS
  Filled 2022-12-18: qty 10

## 2022-12-18 MED ORDER — HYDRALAZINE HCL 20 MG/ML IJ SOLN
5.0000 mg | INTRAMUSCULAR | Status: DC | PRN
  Administered 2022-12-18 – 2022-12-19 (×2): 5 mg via INTRAVENOUS
  Filled 2022-12-18 (×2): qty 1

## 2022-12-18 MED ORDER — IOHEXOL 300 MG/ML  SOLN
100.0000 mL | Freq: Once | INTRAMUSCULAR | Status: AC | PRN
  Administered 2022-12-18: 100 mL via INTRAVENOUS

## 2022-12-18 MED ORDER — PANTOPRAZOLE SODIUM 40 MG IV SOLR
40.0000 mg | Freq: Two times a day (BID) | INTRAVENOUS | Status: DC
  Administered 2022-12-19 – 2022-12-27 (×18): 40 mg via INTRAVENOUS
  Filled 2022-12-18 (×18): qty 10

## 2022-12-18 MED ORDER — ALBUTEROL SULFATE (2.5 MG/3ML) 0.083% IN NEBU: 3.0000 mL | INHALATION_SOLUTION | RESPIRATORY_TRACT | Status: DC | PRN

## 2022-12-18 MED ORDER — DM-GUAIFENESIN ER 30-600 MG PO TB12
1.0000 | ORAL_TABLET | Freq: Two times a day (BID) | ORAL | Status: DC | PRN
  Administered 2022-12-26 – 2022-12-27 (×2): 1 via ORAL
  Filled 2022-12-18 (×2): qty 1

## 2022-12-18 NOTE — ED Notes (Signed)
Beth, RN (admission nurse) here to transport patient. Pt stable at time of departure.

## 2022-12-18 NOTE — ED Provider Notes (Signed)
[facility] Provider Note    Event Date/Time   First MD Initiated Contact with Patient 12/18/22 1114     (approximate)   History   Chief Complaint: Abdominal Pain   HPI  Shane Dockter Sr. is a 37 y.o. male with a past history of alcohol dependence and pancreatic cyst who comes ED complaining of diffuse abdominal pain that started yesterday.  Associated with vomiting which was blood-tinged.  No black or bloody stool.  No dizziness/lightheadedness or syncope.  No chest pain or shortness of breath.  Symptoms are constant no aggravating or alleviating factors     Physical Exam   Triage Vital Signs: ED Triage Vitals  Enc Vitals Group     BP 12/18/22 1108 (!) 179/120     Pulse Rate 12/18/22 1106 (!) 120     Resp 12/18/22 1106 20     Temp 12/18/22 1152 98.8 F (37.1 C)     Temp Source 12/18/22 1106 Oral     SpO2 12/18/22 1106 100 %     Weight 12/18/22 1104 145 lb (65.8 kg)     Height 12/18/22 1104 6\' 1"  (1.854 m)     Head Circumference --      Peak Flow --      Pain Score 12/18/22 1104 10     Pain Loc --      Pain Edu? --      Excl. in [place]? --     Most recent vital signs: Vitals:   12/18/22 1152 12/18/22 1421  BP:  (!) 163/115  Pulse:  (!) 104  Resp:  18  Temp: 98.8 F (37.1 C)   SpO2:  100%    General: Awake, no distress.  CV:  Good peripheral perfusion.  Tachycardia heart rate 110.  Normal distal pulses Resp:  Normal effort.  Clear to auscultation bilaterally Abd:  No distention.  Soft with pronounced epigastric tenderness and diffuse guarding. Other:  Dry mucous membranes   ED Results / Procedures / Treatments   Labs (all labs ordered are listed, but only abnormal results are displayed) Labs Reviewed  RESP PANEL BY RT-PCR (RSV, FLU A&B, COVID)  RVPGX2 - Abnormal; Notable for the following components:      Result Value   Influenza A by PCR POSITIVE (*)    All other components within normal limits  LIPASE, BLOOD - Abnormal; Notable  for the following components:   Lipase 4,265 (*)    All other components within normal limits  COMPREHENSIVE METABOLIC PANEL - Abnormal; Notable for the following components:   Sodium 128 (*)    Chloride 90 (*)    CO2 20 (*)    Glucose, Bld 196 (*)    Calcium 8.5 (*)    Anion gap 18 (*)    All other components within normal limits  CBC - Abnormal; Notable for the following components:   Platelets 135 (*)    All other components within normal limits  URINALYSIS, ROUTINE W REFLEX MICROSCOPIC - Abnormal; Notable for the following components:   Color, Urine YELLOW (*)    APPearance CLEAR (*)    Specific Gravity, Urine >1.046 (*)    Glucose, UA 50 (*)    Ketones, ur 20 (*)    Protein, ur 30 (*)    All other components within normal limits  PROTIME-INR  TRIGLYCERIDES     EKG    RADIOLOGY CT abdomen pelvis interpreted by me, negative for free air.  Radiology report reviewed noting  signs of acute pancreatitis.   PROCEDURES:  Procedures   MEDICATIONS ORDERED IN ED: Medications  ondansetron (ZOFRAN-ODT) disintegrating tablet 4 mg (has no administration in time range)  0.9 %  sodium chloride infusion (has no administration in time range)  ondansetron (ZOFRAN) injection 4 mg (4 mg Intravenous Given 12/18/22 1210)  morphine (PF) 4 MG/ML injection 4 mg (4 mg Intravenous Given 12/18/22 1210)  iohexol (OMNIPAQUE) 300 MG/ML solution 100 mL (100 mLs Intravenous Contrast Given 12/18/22 1317)  pantoprazole (PROTONIX) injection 40 mg (40 mg Intravenous Given 12/18/22 1412)  lactated ringers bolus 1,000 mL (1,000 mLs Intravenous New Bag/Given 12/18/22 1421)  HYDROmorphone (DILAUDID) injection 1 mg (1 mg Intravenous Given 12/18/22 1431)     IMPRESSION / MDM / ASSESSMENT AND PLAN / ED COURSE  I reviewed the triage vital signs and the nursing notes.  DDx: Perforated stomach ulcer, pancreatitis, biliary disease, bowel obstruction, viral illness, dehydration, AKI, electrolyte  abnormality  Patient's presentation is most consistent with acute presentation with potential threat to life or bodily function.  Patient comes to the ED with abdominal pain and vomiting, concerning abdominal exam.  Labs reveal dehydration with a sodium of 128, normal CBC.  Influenza A positive.  Urinalysis negative for infection but consistent with dehydration with concentration and medium ketones.  Lipase pending.  CT does show signs of acute pancreatitis.  No abdominal free air to suggest perforation.  Doubt mesenteric ischemia, AAA, or dissection.  Will continue IV hydration, admit for pain control and bowel rest.   ----------------------------------------- 2:47 PM on 12/18/2022 ----------------------------------------- Lipase is 4000.  Patient having additional severe pain, Dilaudid 1 mg IV ordered.  Case discussed with hospitalist.      FINAL CLINICAL IMPRESSION(S) / ED DIAGNOSES   Final diagnoses:  Acute pancreatitis, unspecified complication status, unspecified pancreatitis type  Influenza A     Rx / DC Orders   ED Discharge Orders     None        Note:  This document was prepared using Dragon voice recognition software and may include unintentional dictation errors.   Carrie Mew, MD 12/18/22 1447

## 2022-12-18 NOTE — ED Triage Notes (Signed)
Pt with c/o lower abdominal pain since yesterday. Pt states he noticed blood in emesis at one point. Pt denies blood in stool. Pt states lower back pain as well. Pt states burning with urination. Pt alert, walking in triage. Pt denies any blood thinners.

## 2022-12-18 NOTE — H&P (Addendum)
History and Physical    Jule Schlabach XBM:[id number] DOB: [birth year] DOA: 12/18/2022  Referring MD/NP/PA:   PCP: [facility]   Patient coming from:  The patient is coming from home.     Chief Complaint: Abdominal pain  HPI: Shane Rajan Sr. is a 37 y.o. male with medical history significant of alcohol abuse in remission, alcoholic pancreatitis with pseudocyst, thrombocytopenia, hyponatremia, GERD, DVT not on anticoagulants, who presents with abdominal pain.  Patient states that his abdominal pain started yesterday, which is located in lower abdomen, constant, sharp, 10 out of 10 in severity, radiating to the lower back.  Associated with multiple episodes of nonbilious vomiting.  Patient states that he noticed dark blood twice in the vomitus.  No diarrhea.  Patient has dry cough, no shortness breath, chest pain.  Patient has chills, no fever.  No symptoms of UTI.  Patient has burning on urination, no hematuria or dysuria.  Patient states that he stopped drinking alcohol more than 4 months ago.   Data reviewed independently and ED Course: pt was found to have lipase 4265, positive Flu A PCR, negative urinalysis, sodium 128, potassium 3.7, GFR> 60.  Temperature normal, blood pressure 163/115, heart rate 120, RR 20, oxygen saturation 100% on room air.  Patient is admitted to [place] bed as inpatient.  CT abdomen/pelvis: 1. Findings of pancreatitis, most likely acute interstitial pancreatitis, with increased peripancreatic free fluid in the left upper quadrant without definite encapsulation. 2. Increased size of uncinate process mass. While this finding may reflect sequela of recurrent pancreatitis, underlying mass lesion is not excluded. Consider further evaluation with MRI once acute inflammation has resolved. 3. Interval development of chronic-appearing attenuation of the splenic vein with development of left upper quadrant collaterals.   EKG: Not done in ED,  will get one.      Review of Systems:   General: no fevers, has chills, no body weight gain, has poor appetite HEENT: no blurry vision, hearing changes or sore throat Respiratory: no dyspnea, has coughing, no wheezing CV: no chest pain, no palpitations GI: has nausea, vomiting, abdominal pain, hematemesis, no diarrhea, constipation GU: no dysuria, burning on urination, increased urinary frequency, hematuria  Ext: no leg edema Neuro: no unilateral weakness, numbness, or tingling, no vision change or hearing loss Skin: no rash, no skin tear. MSK: No muscle spasm, no deformity, no limitation of range of movement in spin Heme: No easy bruising.  Travel history: No recent long distant travel.   Allergy:  Allergies  Allergen Reactions   Oxycodone Rash    Rash/itching which required benadryl to resolve   Sudafed [Pseudoephedrine]     History per pt of this being linked with his seizures    Past Medical History:  Diagnosis Date   Alcohol use disorder, severe, dependence ([place])     History reviewed. No pertinent surgical history.  Social History:  reports that he has been smoking. He has never used smokeless tobacco. He reports that he does not currently use alcohol. He reports that he does not currently use drugs.  Family History:  Family History  Problem Relation Age of Onset   Alcohol abuse Mother      Prior to Admission medications   Medication Sig Start Date End Date Taking? Authorizing Provider  acetaminophen (TYLENOL) 500 MG tablet Take 500 mg by mouth every 6 (six) hours as needed for headache, moderate pain or mild pain.    [provider]  gabapentin (NEURONTIN) 100 MG capsule  Take 1 capsule (100 mg total) by mouth 3 (three) times daily. 04/14/22   Rolly Salter, MD  ondansetron (ZOFRAN-ODT) 4 MG disintegrating tablet Take 1 tablet (4 mg total) by mouth every 8 (eight) hours as needed for nausea or vomiting. 07/02/22   Georga Hacking, MD  pantoprazole  (PROTONIX) 40 MG tablet Take 1 tablet (40 mg total) by mouth daily for 14 days. 04/14/22 04/30/22  Rolly Salter, MD  traMADol (ULTRAM) 50 MG tablet Take 50 mg by mouth every 6 (six) hours as needed.    [provider]    Physical Exam: Vitals:   12/18/22 1108 12/18/22 1133 12/18/22 1152 12/18/22 1421  BP: (!) 179/120 (!) 194/117  (!) 163/115  Pulse:  98  (!) 104  Resp:  20  18  Temp:   98.8 F (37.1 C)   TempSrc: Oral  Oral   SpO2:  100%  100%  Weight:      Height:       General: Not in acute distress HEENT:       Eyes: PERRL, EOMI, no scleral icterus.       ENT: No discharge from the ears and nose, no pharynx injection, no tonsillar enlargement.        Neck: No JVD, no bruit, no mass felt. Heme: No neck lymph node enlargement. Cardiac: S1/S2, RRR, No murmurs, No gallops or rubs. Respiratory: No rales, wheezing, rhonchi or rubs. GI: Soft, nondistended, has tenderness in lower abdomen, no rebound pain, no organomegaly, BS present. GU: No hematuria Ext: No pitting leg edema bilaterally. 1+DP/PT pulse bilaterally. Musculoskeletal: No joint deformities, No joint redness or warmth, no limitation of ROM in spin. Skin: No rashes.  Neuro: Alert, oriented X3, cranial nerves II-XII grossly intact, moves all extremities normally.  Psych: Patient is not psychotic, no suicidal or hemocidal ideation.  Labs on Admission: I have personally reviewed following labs and imaging studies  CBC: Recent Labs  Lab 12/18/22 1110  WBC 8.4  HGB 15.4  HCT 46.0  MCV 89.3  PLT 135*   Basic Metabolic Panel: Recent Labs  Lab 12/18/22 1110  NA 128*  K 3.7  CL 90*  CO2 20*  GLUCOSE 196*  BUN 13  CREATININE 0.89  CALCIUM 8.5*   GFR: Estimated Creatinine Clearance: 106.8 mL/min (by C-G formula based on SCr of 0.89 mg/dL). Liver Function Tests: Recent Labs  Lab 12/18/22 1110  AST 35  ALT 18  ALKPHOS 59  BILITOT 1.0  PROT 7.7  ALBUMIN 4.2   Recent Labs  Lab 12/18/22 1110   LIPASE 4,265*   No results for input(s): "AMMONIA" in the last 168 hours. Coagulation Profile: Recent Labs  Lab 12/18/22 1005  INR 1.0   Cardiac Enzymes: No results for input(s): "CKTOTAL", "CKMB", "CKMBINDEX", "TROPONINI" in the last 168 hours. BNP (last 3 results) No results for input(s): "PROBNP" in the last 8760 hours. HbA1C: No results for input(s): "HGBA1C" in the last 72 hours. CBG: No results for input(s): "GLUCAP" in the last 168 hours. Lipid Profile: No results for input(s): "CHOL", "HDL", "LDLCALC", "TRIG", "CHOLHDL", "LDLDIRECT" in the last 72 hours. Thyroid Function Tests: No results for input(s): "TSH", "T4TOTAL", "FREET4", "T3FREE", "THYROIDAB" in the last 72 hours. Anemia Panel: No results for input(s): "VITAMINB12", "FOLATE", "FERRITIN", "TIBC", "IRON", "RETICCTPCT" in the last 72 hours. Urine analysis:    Component Value Date/Time   COLORURINE YELLOW (A) 12/18/2022 1111   APPEARANCEUR CLEAR (A) 12/18/2022 1111   LABSPEC >1.046 (H) 12/18/2022  1111   PHURINE 6.0 12/18/2022 1111   GLUCOSEU 50 (A) 12/18/2022 1111   HGBUR NEGATIVE 12/18/2022 1111   BILIRUBINUR NEGATIVE 12/18/2022 1111   KETONESUR 20 (A) 12/18/2022 1111   PROTEINUR 30 (A) 12/18/2022 1111   NITRITE NEGATIVE 12/18/2022 1111   LEUKOCYTESUR NEGATIVE 12/18/2022 1111   Sepsis Labs: @LABRCNTIP (procalcitonin:4,lacticidven:4) ) Recent Results (from the past 240 hour(s))  Resp panel by RT-PCR (RSV, Flu A&B, Covid) Anterior Nasal Swab     Status: Abnormal   Collection Time: 12/18/22 11:11 AM   Specimen: Anterior Nasal Swab  Result Value Ref Range Status   SARS Coronavirus 2 by RT PCR NEGATIVE NEGATIVE Final    Comment: (NOTE) SARS-CoV-2 target nucleic acids are NOT DETECTED.  The SARS-CoV-2 RNA is generally detectable in upper respiratory specimens during the acute phase of infection. The lowest concentration of SARS-CoV-2 viral copies this assay can detect is 138 copies/mL. A negative result  does not preclude SARS-Cov-2 infection and should not be used as the sole basis for treatment or other patient management decisions. A negative result may occur with  improper specimen collection/handling, submission of specimen other than nasopharyngeal swab, presence of viral mutation(s) within the areas targeted by this assay, and inadequate number of viral copies(<138 copies/mL). A negative result must be combined with clinical observations, patient history, and epidemiological information. The expected result is Negative.  Fact Sheet for Patients:  EntrepreneurPulse.com.au  Fact Sheet for Healthcare Providers:  IncredibleEmployment.be  This test is no t yet approved or cleared by the [place] FDA and  has been authorized for detection and/or diagnosis of SARS-CoV-2 by FDA under an Emergency Use Authorization (EUA). This EUA will remain  in effect (meaning this test can be used) for the duration of the COVID-19 declaration under Section 564(b)(1) of the Act, 21 U.S.C.section 360bbb-3(b)(1), unless the authorization is terminated  or revoked sooner.       Influenza A by PCR POSITIVE (A) NEGATIVE Final   Influenza B by PCR NEGATIVE NEGATIVE Final    Comment: (NOTE) The Xpert Xpress SARS-CoV-2/FLU/RSV plus assay is intended as an aid in the diagnosis of influenza from Nasopharyngeal swab specimens and should not be used as a sole basis for treatment. Nasal washings and aspirates are unacceptable for Xpert Xpress SARS-CoV-2/FLU/RSV testing.  Fact Sheet for Patients: EntrepreneurPulse.com.au  Fact Sheet for Healthcare Providers: IncredibleEmployment.be  This test is not yet approved or cleared by the [place] FDA and has been authorized for detection and/or diagnosis of SARS-CoV-2 by FDA under an Emergency Use Authorization (EUA). This EUA will remain in effect (meaning this test can be used)  for the duration of the COVID-19 declaration under Section 564(b)(1) of the Act, 21 U.S.C. section 360bbb-3(b)(1), unless the authorization is terminated or revoked.     Resp Syncytial Virus by PCR NEGATIVE NEGATIVE Final    Comment: (NOTE) Fact Sheet for Patients: EntrepreneurPulse.com.au  Fact Sheet for Healthcare Providers: IncredibleEmployment.be  This test is not yet approved or cleared by the [place] FDA and has been authorized for detection and/or diagnosis of SARS-CoV-2 by FDA under an Emergency Use Authorization (EUA). This EUA will remain in effect (meaning this test can be used) for the duration of the COVID-19 declaration under Section 564(b)(1) of the Act, 21 U.S.C. section 360bbb-3(b)(1), unless the authorization is terminated or revoked.  Performed at [facility], [address], [place], [place] [address]      Radiological Exams on Admission: CT ABDOMEN PELVIS W CONTRAST  Result  Date: 12/18/2022 CLINICAL DATA:  Lower abdominal pain associated with dysuria and hematemesis EXAM: CT ABDOMEN AND PELVIS WITH CONTRAST TECHNIQUE: Multidetector CT imaging of the abdomen and pelvis was performed using the standard protocol following bolus administration of intravenous contrast. RADIATION DOSE REDUCTION: This exam was performed according to the departmental dose-optimization program which includes automated exposure control, adjustment of the mA and/or kV according to patient size and/or use of iterative reconstruction technique. CONTRAST:  OMNIPAQUE IOHEXOL 300 MG/ML  SOLN COMPARISON:  CT abdomen and pelvis dated 04/11/2022 and multiple priors dating back to 05/04/2020 FINDINGS: Lower chest: No focal consolidation or pulmonary nodule in the lung bases. No pleural effusion or pneumothorax demonstrated. Partially imaged heart size is normal. Hepatobiliary: No focal hepatic lesions. No intra or extrahepatic biliary ductal  dilation. Normal gallbladder. Pancreas: Improved edematous appearance of the pancreas compared to 04/11/2022 with mild residual peripancreatic free fluid and stranding. 10 mm hypoattenuating focus within the anterior aspect of the pancreatic head (2:27) is similar to 05/04/2020. Centered within the uncinate process, there is a heterogeneous hypoattenuating focus measuring 3.9 x 3.2 cm, increased in size from 2.8 x 2.6 cm. A cystic focus was previously seen in this region on more remote examinations. Irregular pancreatic ductal dilation in the body and tail measuring up to 4 mm. Spleen: Normal in size without focal abnormality. Adrenals/Urinary Tract: No adrenal nodules. No suspicious renal mass, calculi or hydronephrosis. No focal bladder wall thickening. Stomach/Bowel: Normal appearance of the stomach. No evidence of bowel wall thickening, distention, or inflammatory changes. Normal appendix. Vascular/Lymphatic: No significant vascular findings are present. New attenuation of the splenic vein with interval development of left upper quadrant collaterals. No enlarged abdominal or pelvic lymph nodes. Reproductive: Prostate is unremarkable. Other: Increased peripancreatic free fluid without definite encapsulation, for example in the left upper quadrant measuring 10.8 by 6.8 cm (2:14). Ill-defined fluid is also seen tracking inferiorly along the right anterior pararenal fascia and into the pelvis. Free air. Musculoskeletal: No acute or abnormal lytic or blastic osseous lesions. Chronic right twelfth rib fracture. Small fat-containing bilateral inguinal hernias. IMPRESSION: 1. Findings of pancreatitis, most likely acute interstitial pancreatitis, with increased peripancreatic free fluid in the left upper quadrant without definite encapsulation. 2. Increased size of uncinate process mass. While this finding may reflect sequela of recurrent pancreatitis, underlying mass lesion is not excluded. Consider further evaluation  with MRI once acute inflammation has resolved. 3. Interval development of chronic-appearing attenuation of the splenic vein with development of left upper quadrant collaterals. Electronically Signed   By: Agustin Cree M.D.   On: 12/18/2022 13:45      Assessment/Plan Principal Problem:   Acute pancreatitis Active Problems:   Hematemesis   Hyponatremia   Thrombocytopenia (HCC)   Pancreatic mass   Influenza A   Hypertension   Assessment and Plan:  Acute pancreatitis: lipase 4265.   CT scan showed pancreatitis, most likely acute interstitial pancreatitis, with increased peripancreatic free fluid in the left upper quadrant without definite encapsulation. Pt states that he stopped alcohol more than 4 months ago.  Etiology is not clear.  -will admit to med-surg bed as inpt -NPO for pancreatitis -IVF: 1L LR and then at 125 cc/hr of NS -prn IV fentanyl for pain control -prn IV zofran for nausea -check triglyceride level  Hematemesis: Hgb 15.4.  Possibly due to alcoholic gastritis -Protonix 40 mg twice daily -Check INR/PTT/type screen -Follow-up CBC twice daily  Hyponatremia: Sodium 128.  Mental status normal. Likely due to poor  oral intake and dehydration - Will check urine sodium, urine osmolality, serum osmolality. - IVF: as above - Sodium chloride tablet 1 g twice daily - f/u by BMP q8h  Thrombocytopenia ([place]): Platelet 135, likely related to history of alcohol abuse -Follow-up with CBC  Pancreatic mass: CT showed  increased size of uncinate process mass. While this finding may reflect sequela of recurrent pancreatitis, underlying mass lesion is not excluded. I have discussed this issue with patient.  I recommended patient to follow-up with PCP for further evaluation with MRI as outpatient when his acute pancreatitis resolves.  He agreed to do so.  Influenza A: Oxygen saturation 100% on room air. -Tamiflu 75 mg twice daily -As needed Mucinex and albuterol  Hypertension: Patient  denies history of hypertension.  His blood pressure is 163/115.  May be due to pain -IV hydralazine as needed     DVT ppx: SCD  Code Status: Full code  Family Communication: not done, no family member is at bed side.   Disposition Plan:  Anticipate discharge back to previous environment  Consults called:  none  Admission status and Level of care: Med-Surg:  as inpt     Dispo: The patient is from: Home              Anticipated d/c is to: Home              Anticipated d/c date is: 2 days              Patient currently is not medically stable to d/c.    Severity of Illness:  The appropriate patient status for this patient is INPATIENT. Inpatient status is judged to be reasonable and necessary in order to provide the required intensity of service to ensure the patient's safety. The patient's presenting symptoms, physical exam findings, and initial radiographic and laboratory data in the context of their chronic comorbidities is felt to place them at high risk for further clinical deterioration. Furthermore, it is not anticipated that the patient will be medically stable for discharge from the hospital within 2 midnights of admission.   * I certify that at the point of admission it is my clinical judgment that the patient will require inpatient hospital care spanning beyond 2 midnights from the point of admission due to high intensity of service, high risk for further deterioration and high frequency of surveillance required.*       Date of Service 12/18/2022    Ivor [facility]   If 7PM-7AM, please contact night-coverage [email or web address] 12/18/2022, 3:29 PM

## 2022-12-19 DIAGNOSIS — K852 Alcohol induced acute pancreatitis without necrosis or infection: Secondary | ICD-10-CM

## 2022-12-19 DIAGNOSIS — K859 Acute pancreatitis without necrosis or infection, unspecified: Secondary | ICD-10-CM | POA: Diagnosis not present

## 2022-12-19 LAB — CBC
HCT: 36.4 % — ABNORMAL LOW (ref 39.0–52.0)
HCT: 31.4 % — ABNORMAL LOW (ref 39.0–52.0)
HCT: 32.9 % — ABNORMAL LOW (ref 39.0–52.0)
Hemoglobin: 12.6 g/dL — ABNORMAL LOW (ref 13.0–17.0)
Hemoglobin: 10.6 g/dL — ABNORMAL LOW (ref 13.0–17.0)
Hemoglobin: 10.9 g/dL — ABNORMAL LOW (ref 13.0–17.0)
MCH: 30.6 pg (ref 26.0–34.0)
MCH: 29.8 pg (ref 26.0–34.0)
MCH: 30 pg (ref 26.0–34.0)
MCHC: 34.6 g/dL (ref 30.0–36.0)
MCHC: 33.8 g/dL (ref 30.0–36.0)
MCHC: 33.1 g/dL (ref 30.0–36.0)
MCV: 88.3 fL (ref 80.0–100.0)
MCV: 88.2 fL (ref 80.0–100.0)
MCV: 90.6 fL (ref 80.0–100.0)
Platelets: 109 10*3/uL — ABNORMAL LOW (ref 150–400)
Platelets: 86 10*3/uL — ABNORMAL LOW (ref 150–400)
Platelets: 85 10*3/uL — ABNORMAL LOW (ref 150–400)
RBC: 4.12 MIL/uL — ABNORMAL LOW (ref 4.22–5.81)
RBC: 3.56 MIL/uL — ABNORMAL LOW (ref 4.22–5.81)
RBC: 3.63 MIL/uL — ABNORMAL LOW (ref 4.22–5.81)
RDW: 12.8 % (ref 11.5–15.5)
RDW: 12.8 % (ref 11.5–15.5)
RDW: 12.8 % (ref 11.5–15.5)
WBC: 7.3 10*3/uL (ref 4.0–10.5)
WBC: 5.1 10*3/uL (ref 4.0–10.5)
WBC: 4.7 10*3/uL (ref 4.0–10.5)
nRBC: 0 % (ref 0.0–0.2)
nRBC: 0 % (ref 0.0–0.2)
nRBC: 0 % (ref 0.0–0.2)

## 2022-12-19 LAB — BASIC METABOLIC PANEL
Anion gap: 9 (ref 5–15)
Anion gap: 10 (ref 5–15)
BUN: 9 mg/dL (ref 6–20)
BUN: 8 mg/dL (ref 6–20)
CO2: 23 mmol/L (ref 22–32)
CO2: 22 mmol/L (ref 22–32)
Calcium: 7.2 mg/dL — ABNORMAL LOW (ref 8.9–10.3)
Calcium: 7.5 mg/dL — ABNORMAL LOW (ref 8.9–10.3)
Chloride: 101 mmol/L (ref 98–111)
Chloride: 96 mmol/L — ABNORMAL LOW (ref 98–111)
Creatinine, Ser: 0.76 mg/dL (ref 0.61–1.24)
Creatinine, Ser: 0.76 mg/dL (ref 0.61–1.24)
GFR, Estimated: >60 mL/min (ref >60)
GFR, Estimated: >60 mL/min (ref >60)
Glucose, Bld: 93 mg/dL (ref 70–99)
Glucose, Bld: 94 mg/dL (ref 70–99)
Potassium: 3.5 mmol/L (ref 3.5–5.1)
Potassium: 3.5 mmol/L (ref 3.5–5.1)
Sodium: 133 mmol/L — ABNORMAL LOW (ref 135–145)
Sodium: 128 mmol/L — ABNORMAL LOW (ref 135–145)

## 2022-12-19 LAB — GLUCOSE, CAPILLARY: Glucose-Capillary: 90 mg/dL (ref 70–99)

## 2022-12-19 LAB — HIV ANTIBODY (ROUTINE TESTING W REFLEX): HIV Screen 4th Generation wRfx: NONREACTIVE

## 2022-12-19 LAB — LIPASE, BLOOD: Lipase: 388 U/L — ABNORMAL HIGH (ref 11–51)

## 2022-12-19 NOTE — Progress Notes (Signed)
PROGRESS NOTE    Shane Keck Sr.  [id number] DOB: [birth year] DOA: 12/18/2022 PCP: Pediatrics, [facility]    Brief Narrative:  This 37 years old male with PMH significant for alcohol abuse in remission, alcoholic pancreatitis with pseudocyst, thrombocytopenia, hyponatremia, GERD, DVT not on anticoagulant presented to the ED with complaints of abdominal pain. He describes abdominal pain as sharp, constant, severe in intensity,  radiating towards the back.  Patient is found to have lipase 4265, influenza A+,CT abdomen and pelvis shows finding consistent with acute interstitial pancreatitis.  Increased size of uncinate process mass,  consider further evaluation with MRI once acute inflammation has resolved.  Patient is admitted for further evaluation.  Assessment & Plan:   Principal Problem:   Acute pancreatitis Active Problems:   Hematemesis   Hyponatremia   Thrombocytopenia (HCC)   Pancreatic mass   Influenza A   Hypertension  Acute pancreatitis : He presented with abdominal pain,  lipase 4265.    CT scan showed pancreatitis, most likely acute interstitial pancreatitis. Pt states that he stopped alcohol more than 4 months ago.  Etiology is not clear. Continue bowel rest, IV fluid resuscitation. Continue IV pain control with fentanyl. Continue IV Zofran for nausea and vomiting Triglyceride levels normal. Lipase trending down 4265 > 388  Hematemesis: Patient reports 2 episodes of coffee-ground emesis at home. Hgb 15.4.  Possibly due to alcoholic gastritis Continue Protonix 40 mg IV every 12 hours. Monitor H&H every 12 hours. Hb 15.1> 12.6>10.6 GI is consulted.  Awaiting recommendation.  Hyponatremia:  Sodium 128.  Could be secondary to poor oral intake and dehydration.   Mental status normal.  Continue IV fluid resuscitation. Serum sodium has improved to 133.   Thrombocytopenia ([place]):  Platelet 135, likely related to history of alcohol abuse -Follow-up  with CBC   Pancreatic mass:  CT showed  increased size of uncinate process mass. While this finding may reflect sequela of recurrent pancreatitis, underlying mass lesion is not excluded.  I recommended patient to follow-up with PCP for further evaluation with MRI as outpatient when his acute pancreatitis resolves.  He agreed to do so.   Influenza A:  SpO2 100% on room air. Continue Tamiflu 75 mg twice daily As needed Mucinex and albuterol   Hypertension:  Patient denies history of hypertension.   His blood pressure is 163/115.  May be due to pain -IV hydralazine as needed    DVT prophylaxis: SCDs Code Status: Full code Family Communication: No family at bed side. Disposition Plan:   Admitted for acute abdominal pain consistent with acute pancreatitis.  He reported an episode of hematemesis at home.  GI is consulted.   Consultants:  Gastroenterology  Procedures: None  Antimicrobials: None  Subjective: Patient was seen and examined at bedside.  Overnight events noted.  Patient reports feeling better,  still reports having abdominal tenderness,  does not feel to resume diet.  Objective: Vitals:   12/18/22 1805 12/18/22 1900 12/18/22 1944 12/19/22 0437  BP: (!) 170/108 (!) 161/106 (!) 182/99 (!) 140/93  Pulse: 95 (!) 101 (!) 110 90  Resp: 18 18 18 17  $ Temp: 98.9 F (37.2 C) 98.8 F (37.1 C) 98.3 F (36.8 C) 99.9 F (37.7 C)  TempSrc: Oral Oral Oral Oral  SpO2: 98% 97% 100% 98%  Weight:      Height:        Intake/Output Summary (Last 24 hours) at 12/19/2022 1137 Last data filed at 12/19/2022 0600 Gross per 24 hour  Intake 1249.78 ml  Output --  Net 1249.78 ml   Filed Weights   12/18/22 1104  Weight: 65.8 kg    Examination:  General exam: Appears calm and comfortable, not in any acute distress. Respiratory system: Clear to auscultation. Respiratory effort normal.  RR 16 Cardiovascular system: S1 & S2 heard, RRR. No JVD, murmurs, rubs, gallops or clicks. No  pedal edema. Gastrointestinal system: Abdomen is soft, generally tender, nondistended, BS +. Central nervous system: Alert and oriented. No focal neurological deficits. Extremities: Symmetric 5 x 5 power. Skin: No rashes, lesions or ulcers Psychiatry: Judgement and insight appear normal. Mood & affect appropriate.     Data Reviewed: I have personally reviewed following labs and imaging studies  CBC: Recent Labs  Lab 12/18/22 1110 12/18/22 2308 12/19/22 0830  WBC 8.4 7.3 5.1  HGB 15.4 12.6* 10.6*  HCT 46.0 36.4* 31.4*  MCV 89.3 88.3 88.2  PLT 135* 109* 86*   Basic Metabolic Panel: Recent Labs  Lab 12/18/22 1110 12/18/22 1518 12/18/22 2308 12/19/22 0604  NA 128* 129* 130* 133*  K 3.7 4.2 3.7 3.5  CL 90* 93* 97* 101  CO2 20* 29 24 23  $ GLUCOSE 196* 145* 116* 93  BUN 13 12 10 9  $ CREATININE 0.89 0.77 0.82 0.76  CALCIUM 8.5* 8.1* 7.9* 7.2*  MG  --  1.8  --   --   PHOS  --  3.2  --   --    GFR: Estimated Creatinine Clearance: 118.8 mL/min (by C-G formula based on SCr of 0.76 mg/dL). Liver Function Tests: Recent Labs  Lab 12/18/22 1110  AST 35  ALT 18  ALKPHOS 59  BILITOT 1.0  PROT 7.7  ALBUMIN 4.2   Recent Labs  Lab 12/18/22 1110 12/19/22 0830  LIPASE 4,265* 388*   No results for input(s): "AMMONIA" in the last 168 hours. Coagulation Profile: Recent Labs  Lab 12/18/22 1005  INR 1.0   Cardiac Enzymes: No results for input(s): "CKTOTAL", "CKMB", "CKMBINDEX", "TROPONINI" in the last 168 hours. BNP (last 3 results) No results for input(s): "PROBNP" in the last 8760 hours. HbA1C: No results for input(s): "HGBA1C" in the last 72 hours. CBG: Recent Labs  Lab 12/19/22 0808  GLUCAP 90   Lipid Profile: Recent Labs    12/18/22 1518  TRIG 47   Thyroid Function Tests: No results for input(s): "TSH", "T4TOTAL", "FREET4", "T3FREE", "THYROIDAB" in the last 72 hours. Anemia Panel: No results for input(s): "VITAMINB12", "FOLATE", "FERRITIN", "TIBC",  "IRON", "RETICCTPCT" in the last 72 hours. Sepsis Labs: No results for input(s): "PROCALCITON", "LATICACIDVEN" in the last 168 hours.  Recent Results (from the past 240 hour(s))  Resp panel by RT-PCR (RSV, Flu A&B, Covid) Anterior Nasal Swab     Status: Abnormal   Collection Time: 12/18/22 11:11 AM   Specimen: Anterior Nasal Swab  Result Value Ref Range Status   SARS Coronavirus 2 by RT PCR NEGATIVE NEGATIVE Final    Comment: (NOTE) SARS-CoV-2 target nucleic acids are NOT DETECTED.  The SARS-CoV-2 RNA is generally detectable in upper respiratory specimens during the acute phase of infection. The lowest concentration of SARS-CoV-2 viral copies this assay can detect is 138 copies/mL. A negative result does not preclude SARS-Cov-2 infection and should not be used as the sole basis for treatment or other patient management decisions. A negative result may occur with  improper specimen collection/handling, submission of specimen other than nasopharyngeal swab, presence of viral mutation(s) within the areas targeted by this assay, and inadequate  number of viral copies(<138 copies/mL). A negative result must be combined with clinical observations, patient history, and epidemiological information. The expected result is Negative.  Fact Sheet for Patients:  EntrepreneurPulse.com.au  Fact Sheet for Healthcare Providers:  IncredibleEmployment.be  This test is no t yet approved or cleared by the [place] FDA and  has been authorized for detection and/or diagnosis of SARS-CoV-2 by FDA under an Emergency Use Authorization (EUA). This EUA will remain  in effect (meaning this test can be used) for the duration of the COVID-19 declaration under Section 564(b)(1) of the Act, 21 U.S.C.section 360bbb-3(b)(1), unless the authorization is terminated  or revoked sooner.       Influenza A by PCR POSITIVE (A) NEGATIVE Final   Influenza B by PCR NEGATIVE  NEGATIVE Final    Comment: (NOTE) The Xpert Xpress SARS-CoV-2/FLU/RSV plus assay is intended as an aid in the diagnosis of influenza from Nasopharyngeal swab specimens and should not be used as a sole basis for treatment. Nasal washings and aspirates are unacceptable for Xpert Xpress SARS-CoV-2/FLU/RSV testing.  Fact Sheet for Patients: EntrepreneurPulse.com.au  Fact Sheet for Healthcare Providers: IncredibleEmployment.be  This test is not yet approved or cleared by the [place] FDA and has been authorized for detection and/or diagnosis of SARS-CoV-2 by FDA under an Emergency Use Authorization (EUA). This EUA will remain in effect (meaning this test can be used) for the duration of the COVID-19 declaration under Section 564(b)(1) of the Act, 21 U.S.C. section 360bbb-3(b)(1), unless the authorization is terminated or revoked.     Resp Syncytial Virus by PCR NEGATIVE NEGATIVE Final    Comment: (NOTE) Fact Sheet for Patients: EntrepreneurPulse.com.au  Fact Sheet for Healthcare Providers: IncredibleEmployment.be  This test is not yet approved or cleared by the [place] FDA and has been authorized for detection and/or diagnosis of SARS-CoV-2 by FDA under an Emergency Use Authorization (EUA). This EUA will remain in effect (meaning this test can be used) for the duration of the COVID-19 declaration under Section 564(b)(1) of the Act, 21 U.S.C. section 360bbb-3(b)(1), unless the authorization is terminated or revoked.  Performed at [facility], [address] [place], [place] [address]     Radiology Studies: CT ABDOMEN PELVIS W CONTRAST  Result Date: 12/18/2022 CLINICAL DATA:  Lower abdominal pain associated with dysuria and hematemesis EXAM: CT ABDOMEN AND PELVIS WITH CONTRAST TECHNIQUE: Multidetector CT imaging of the abdomen and pelvis was performed using the standard protocol  following bolus administration of intravenous contrast. RADIATION DOSE REDUCTION: This exam was performed according to the departmental dose-optimization program which includes automated exposure control, adjustment of the mA and/or kV according to patient size and/or use of iterative reconstruction technique. CONTRAST:  155m OMNIPAQUE IOHEXOL 300 MG/ML  SOLN COMPARISON:  CT abdomen and pelvis dated 04/11/2022 and multiple priors dating back to 05/04/2020 FINDINGS: Lower chest: No focal consolidation or pulmonary nodule in the lung bases. No pleural effusion or pneumothorax demonstrated. Partially imaged heart size is normal. Hepatobiliary: No focal hepatic lesions. No intra or extrahepatic biliary ductal dilation. Normal gallbladder. Pancreas: Improved edematous appearance of the pancreas compared to 04/11/2022 with mild residual peripancreatic free fluid and stranding. 10 mm hypoattenuating focus within the anterior aspect of the pancreatic head (2:27) is similar to 05/04/2020. Centered within the uncinate process, there is a heterogeneous hypoattenuating focus measuring 3.9 x 3.2 cm, increased in size from 2.8 x 2.6 cm. A cystic focus was previously seen in this region on more remote examinations. Irregular pancreatic ductal  dilation in the body and tail measuring up to 4 mm. Spleen: Normal in size without focal abnormality. Adrenals/Urinary Tract: No adrenal nodules. No suspicious renal mass, calculi or hydronephrosis. No focal bladder wall thickening. Stomach/Bowel: Normal appearance of the stomach. No evidence of bowel wall thickening, distention, or inflammatory changes. Normal appendix. Vascular/Lymphatic: No significant vascular findings are present. New attenuation of the splenic vein with interval development of left upper quadrant collaterals. No enlarged abdominal or pelvic lymph nodes. Reproductive: Prostate is unremarkable. Other: Increased peripancreatic free fluid without definite encapsulation,  for example in the left upper quadrant measuring 10.8 by 6.8 cm (2:14). Ill-defined fluid is also seen tracking inferiorly along the right anterior pararenal fascia and into the pelvis. Free air. Musculoskeletal: No acute or abnormal lytic or blastic osseous lesions. Chronic right twelfth rib fracture. Small fat-containing bilateral inguinal hernias. IMPRESSION: 1. Findings of pancreatitis, most likely acute interstitial pancreatitis, with increased peripancreatic free fluid in the left upper quadrant without definite encapsulation. 2. Increased size of uncinate process mass. While this finding may reflect sequela of recurrent pancreatitis, underlying mass lesion is not excluded. Consider further evaluation with MRI once acute inflammation has resolved. 3. Interval development of chronic-appearing attenuation of the splenic vein with development of left upper quadrant collaterals. Electronically Signed   By: Darrin Nipper M.D.   On: 12/18/2022 13:45    Scheduled Meds:  nicotine  21 mg Transdermal Daily   oseltamivir  75 mg Oral BID   pantoprazole (PROTONIX) IV  40 mg Intravenous Q12H   sodium chloride  1 g Oral BID WC   Continuous Infusions:  sodium chloride 125 mL/hr at 12/19/22 0728     LOS: 1 day    Time spent: 50 mins    Clifford Coudriet, MD [facility]   If 7PM-7AM, please contact night-coverage

## 2022-12-19 NOTE — Plan of Care (Signed)
°  Problem: Nutrition: °Goal: Adequate nutrition will be maintained °Outcome: Progressing °  °Problem: Coping: °Goal: Level of anxiety will decrease °Outcome: Progressing °  °Problem: Safety: °Goal: Ability to remain free from injury will improve °Outcome: Progressing °  °

## 2022-12-19 NOTE — TOC Initial Note (Signed)
Transition of Care (TOC) - Initial/Assessment Note    Patient Details  Name: Elzia Klase Sr. MRN: [id number] Date of Birth: 1986-09-05  [facility]) CM/SW Contact:    Beverly Sessions, RN Phone Number: 12/19/2022, 11:49 AM  Clinical Narrative:                   Transition of Care [facility]) Screening Note   Patient Details  Name: Delquan Huibregtse Sr. Date of Birth: 11-13-1985   Transition [facility] Care [facility]) CM/SW Contact:    Beverly Sessions, RN Phone Number: 12/19/2022, 11:49 AM    Transition of Care Department [facility]) has reviewed patient and no TOC needs have been identified at this time. We will continue to monitor patient advancement through interdisciplinary progression rounds. If new patient transition needs arise, please place a TOC consult.         Patient Goals and CMS Choice            Expected Discharge Plan and Services                                              Prior Living Arrangements/Services                       Activities of Daily Living Home Assistive Devices/Equipment: None ADL Screening (condition at time of admission) Patient's cognitive ability adequate to safely complete daily activities?: Yes Is the patient deaf or have difficulty hearing?: No Does the patient have difficulty seeing, even when wearing glasses/contacts?: No Does the patient have difficulty concentrating, remembering, or making decisions?: No Patient able to express need for assistance with ADLs?: Yes Does the patient have difficulty dressing or bathing?: No Independently performs ADLs?: Yes (appropriate for developmental age) Does the patient have difficulty walking or climbing stairs?: No Weakness of Legs: None Weakness of Arms/Hands: None  Permission Sought/Granted                  Emotional Assessment              Admission diagnosis:  Acute pancreatitis [K85.90] Influenza A [J10.1] Acute pancreatitis, unspecified complication  status, unspecified pancreatitis type [K85.90] Patient Active Problem List   Diagnosis Date Noted   Hematemesis 12/18/2022   Pancreatic mass 12/18/2022   Influenza A 12/18/2022   Hypertension 12/18/2022   Alcohol induced acute pancreatitis 04/11/2022   Hypertensive urgency 04/11/2022   Pancreatic cyst 12/03/2021   Alcohol use disorder, moderate, dependence ([place]) 12/03/2021   History of DVT (deep vein thrombosis) 12/03/2021   Chronic anticoagulation 12/03/2021   Alcohol use disorder in remission 12/03/2021   Elevated blood-pressure reading, without diagnosis of hypertension 12/03/2021   COVID-19 virus infection 12/03/2021   Bilateral pleural effusion 01/09/2021   Malnutrition of moderate degree 01/03/2021   Acute pancreatitis 12/29/2020   Nicotine dependence 12/29/2020   RLS (restless legs syndrome) 09/12/2020   Pressure injury of skin 05/28/2020   Protein-calorie malnutrition, severe 05/22/2020   Hypotension    Abdominal ascites    Acute metabolic encephalopathy [id number]   Severe malnutrition ([place]) 05/10/2020   Sinus tachycardia 05/10/2020   Delirium tremens ([place]) 05/03/2020   Hyponatremia 05/03/2020   Hypokalemia 05/03/2020   AKI (acute kidney injury) ([place]) 05/03/2020   Elevated CK 05/03/2020   Heme positive stool 05/03/2020   Thrombocytopenia ([place]) 05/03/2020   PCP:  Pediatrics, [place]:   [facility] [address] [place] [address] [place]) [place] [address] Phone: [phone] Fax: [phone]     Social Determinants of Health (SDOH) Social History: SDOH Screenings   Food Insecurity: No Food Insecurity (12/18/2022)  Housing: Low Risk  (12/18/2022)  Transportation Needs: No Transportation Needs (12/18/2022)  Utilities: Not At Risk (12/18/2022)  Tobacco Use: High Risk (12/18/2022)   SDOH Interventions:     Readmission Risk Interventions     No data to display

## 2022-12-19 NOTE — Consult Note (Signed)
Lucilla Lame, MD [facility]  [address], [place], [place] [address] Phone: [phone] Fax : [phone]  Consultation  Referring Provider:     Dr. Dwyane Dee Primary Care Physician:  P[facility], [facility] Primary Gastroenterologist:  Dr. Allen Norris         Reason for Consultation:     Pancreatitis  Date of Admission:  12/18/2022 Date of Consultation:  12/19/2022         HPI:   Lawernce Du. is a 37 y.o. male who has a history of alcohol abuse and had seen me back in 2021: The hospital and then was seen by  Dr. 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  The patient then followed up with me in June 2023.  The patient now comes in with abdominal pain that started yesterday morning and he came to the hospital and was found to have a positive test for influenza A.  The patient was also found to have a lipase of 4265 yesterday on admission that came down to 388 today.  He reports that he is feeling much better today but still having abdominal pain.  The patient's baseline hemoglobin approximately 8 months ago was between 12.6 and 13.2.  The patient came in with a hemoglobin of 15.4 which appears to be due to dehydration with a drop to 12.6 yesterday and now with a hemoglobin of 10.6.  The patient denies any black stools or bloody stools.  The BUN has been normal since admission.  The patient had a CT scan of the abdomen yesterday that showed  IMPRESSION: 1. Findings of pancreatitis, most likely acute interstitial pancreatitis, with increased peripancreatic free fluid in the left upper quadrant without definite encapsulation. 2. Increased size of uncinate process mass. While this finding may reflect sequela of recurrent pancreatitis, underlying mass lesion is not excluded. Consider further evaluation with MRI once acute inflammation has resolved. 3. Interval development of chronic-appearing attenuation of the splenic vein  with development of left upper quadrant collaterals.  With the patient had previously seen me in the office in June he was recommended to have a repeat CT scan in 4 months to assess the lesion but did not follow-up with a repeat CT scan.  It appears that the patient had a CT scan ordered on October 3 but was a no-show for that procedure.  Past Medical History:  Diagnosis Date   Alcohol use disorder, severe, dependence ([place])     History reviewed. No pertinent surgical history.  Prior to Admission medications   Medication Sig Start Date End Date Taking? Authorizing Provider  acetaminophen (TYLENOL) 500 MG tablet Take 500 mg by mouth every 6 (six) hours as needed for headache, moderate pain or mild pain.    [provider]  gabapentin (NEURONTIN) 100 MG capsule Take 1 capsule (100 mg total) by mouth 3 (three) times daily. Patient not taking: Reported on 12/18/2022 04/14/22   Lavina Hamman, MD  ondansetron (ZOFRAN-ODT) 4 MG disintegrating tablet Take 1 tablet (4 mg total) by mouth every 8 (eight) hours as needed for nausea or vomiting. Patient not taking: Reported on 12/18/2022 07/02/22   Rada Hay, MD  pantoprazole (PROTONIX) 40 MG tablet Take 1 tablet (40 mg total) by mouth daily for 14 days. 04/14/22 04/30/22  Lavina Hamman, MD  traMADol (ULTRAM) 50 MG tablet Take 50 mg by mouth every 6 (six) hours as needed.  Patient not taking: Reported on 12/18/2022    [provider]    Family History  Problem Relation Age of Onset   Alcohol abuse Mother      Social History   Tobacco Use   Smoking status: Every Day   Smokeless tobacco: Never  Substance Use Topics   Alcohol use: Not Currently    Comment: none since June 2021   Drug use: Not Currently    Allergies as of 12/18/2022 - Review Complete 12/18/2022  Allergen Reaction Noted   Oxycodone Rash 01/02/2021   Sudafed [pseudoephedrine]  05/03/2020    Review of Systems:    All systems reviewed and negative except  where noted in HPI.   Physical Exam:  Vital signs in last 24 hours: Temp:  [98.3 F (36.8 C)-99.9 F (37.7 C)] 99.9 F (37.7 C) (02/09 0437) Pulse Rate:  [90-110] 90 (02/09 0437) Resp:  [17-18] 17 (02/09 0437) BP: (140-182)/(93-115) 140/93 (02/09 0437) SpO2:  [97 %-100 %] 98 % (02/09 0437) Last BM Date : 12/15/22 General:   Pleasant, cooperative in NAD Head:  Normocephalic and atraumatic. Eyes:   No icterus.   Conjunctiva pink. PERRLA. Ears:  Normal auditory acuity. Neck:  Supple; no masses or thyroidomegaly Lungs: Respirations even and unlabored. Lungs clear to auscultation bilaterally.   No wheezes, crackles, or rhonchi.  Heart:  Regular rate and rhythm;  Without murmur, clicks, rubs or gallops Abdomen:  Soft, nondistended, diffusely tender. Normal bowel sounds. No appreciable masses or hepatomegaly.  No rebound or guarding.  Rectal:  Not performed. Msk:  Symmetrical without gross deformities.    Extremities:  Without edema, cyanosis or clubbing. Neurologic:  Alert and oriented x3;  grossly normal neurologically. Skin:  Intact without significant lesions or rashes. Cervical Nodes:  No significant cervical adenopathy. Psych:  Alert and cooperative. Normal affect.  LAB RESULTS: Recent Labs    12/18/22 1110 12/18/22 2308 12/19/22 0830  WBC 8.4 7.3 5.1  HGB 15.4 12.6* 10.6*  HCT 46.0 36.4* 31.4*  PLT 135* 109* 86*   BMET Recent Labs    12/18/22 1518 12/18/22 2308 12/19/22 0604  NA 129* 130* 133*  K 4.2 3.7 3.5  CL 93* 97* 101  CO2 29 24 23  $ GLUCOSE 145* 116* 93  BUN 12 10 9  $ CREATININE 0.77 0.82 0.76  CALCIUM 8.1* 7.9* 7.2*   LFT Recent Labs    12/18/22 1110  PROT 7.7  ALBUMIN 4.2  AST 35  ALT 18  ALKPHOS 59  BILITOT 1.0   PT/INR Recent Labs    12/18/22 1005  LABPROT 12.7  INR 1.0    STUDIES: CT ABDOMEN PELVIS W CONTRAST  Result Date: 12/18/2022 CLINICAL DATA:  Lower abdominal pain associated with dysuria and hematemesis EXAM: CT ABDOMEN AND  PELVIS WITH CONTRAST TECHNIQUE: Multidetector CT imaging of the abdomen and pelvis was performed using the standard protocol following bolus administration of intravenous contrast. RADIATION DOSE REDUCTION: This exam was performed according to the departmental dose-optimization program which includes automated exposure control, adjustment of the mA and/or kV according to patient size and/or use of iterative reconstruction technique. CONTRAST:  161m OMNIPAQUE IOHEXOL 300 MG/ML  SOLN COMPARISON:  CT abdomen and pelvis dated 04/11/2022 and multiple priors dating back to 05/04/2020 FINDINGS: Lower chest: No focal consolidation or pulmonary nodule in the lung bases. No pleural effusion or pneumothorax demonstrated. Partially imaged heart size is normal. Hepatobiliary: No focal hepatic lesions. No intra or extrahepatic biliary ductal dilation. Normal gallbladder. Pancreas: Improved edematous  appearance of the pancreas compared to 04/11/2022 with mild residual peripancreatic free fluid and stranding. 10 mm hypoattenuating focus within the anterior aspect of the pancreatic head (2:27) is similar to 05/04/2020. Centered within the uncinate process, there is a heterogeneous hypoattenuating focus measuring 3.9 x 3.2 cm, increased in size from 2.8 x 2.6 cm. A cystic focus was previously seen in this region on more remote examinations. Irregular pancreatic ductal dilation in the body and tail measuring up to 4 mm. Spleen: Normal in size without focal abnormality. Adrenals/Urinary Tract: No adrenal nodules. No suspicious renal mass, calculi or hydronephrosis. No focal bladder wall thickening. Stomach/Bowel: Normal appearance of the stomach. No evidence of bowel wall thickening, distention, or inflammatory changes. Normal appendix. Vascular/Lymphatic: No significant vascular findings are present. New attenuation of the splenic vein with interval development of left upper quadrant collaterals. No enlarged abdominal or pelvic lymph  nodes. Reproductive: Prostate is unremarkable. Other: Increased peripancreatic free fluid without definite encapsulation, for example in the left upper quadrant measuring 10.8 by 6.8 cm (2:14). Ill-defined fluid is also seen tracking inferiorly along the right anterior pararenal fascia and into the pelvis. Free air. Musculoskeletal: No acute or abnormal lytic or blastic osseous lesions. Chronic right twelfth rib fracture. Small fat-containing bilateral inguinal hernias. IMPRESSION: 1. Findings of pancreatitis, most likely acute interstitial pancreatitis, with increased peripancreatic free fluid in the left upper quadrant without definite encapsulation. 2. Increased size of uncinate process mass. While this finding may reflect sequela of recurrent pancreatitis, underlying mass lesion is not excluded. Consider further evaluation with MRI once acute inflammation has resolved. 3. Interval development of chronic-appearing attenuation of the splenic vein with development of left upper quadrant collaterals. Electronically Signed   By: Darrin Nipper M.D.   On: 12/18/2022 13:45      Impression / Plan:   Assessment: Principal Problem:   Acute pancreatitis Active Problems:   Hyponatremia   Thrombocytopenia (HCC)   Hematemesis   Pancreatic mass   Influenza A   Hypertension   Latray Surrency Sr. is a 36 y.o. y/o male with with recurrent pancreatitis with a lesion on the uncinate process and acute pancreatitis.  The patient was supposed to have a repeat CT scan in October but did not show up for that imaging.  The patient's drop in hemoglobin of 5 g is likely due to hydration since he has a normal BUN and has had no sign of a blood loss of 5 g equaling approximately 5 units of blood loss.  The patient's lipase has gone down from 4265 down to 388 and his pain is improving.  Plan:  I recommend continued conservative management which appears to be working with the patient's lipase going down and his pain improving.  The  patient states he has not drank in 4 months and he should continue alcohol abstinence.  The patient's blood counts going down is unlikely GI bleed and more likely from hemoconcentration on admission and then a result of hydration.  No need to initiate any luminal investigation at this time.  The patient will need a MRI of the pancreas to look for resolution of his mass when the pancreatitis has resolved.  The patient's CA 19-9 was elevated at 81 back in 2021 and I will reorder a repeat of the CA 19-9 during this admission.  If the patient should show any sign of any acute GI bleeding please do not hesitate to reconsult [place].  As for the pancreatitis that is responding to conservative therapy.  I will sign off.  Please call if any further GI concerns or questions.  We would like to thank you for the opportunity to participate in the care of [place]..   Thank you for involving me in the care of this patient.      LOS: 1 day   Lucilla Lame, MD, [facility] 12/19/2022, 11:50 AM,  Pager [phone] 7am-5pm  Check AMION for 5pm -7am coverage and on weekends   Note: This dictation was prepared with Dragon dictation along with smaller phrase technology. Any transcriptional errors that result from this process are unintentional.

## 2022-12-20 DIAGNOSIS — E871 Hypo-osmolality and hyponatremia: Secondary | ICD-10-CM | POA: Diagnosis not present

## 2022-12-20 DIAGNOSIS — K920 Hematemesis: Secondary | ICD-10-CM | POA: Diagnosis not present

## 2022-12-20 DIAGNOSIS — K859 Acute pancreatitis without necrosis or infection, unspecified: Secondary | ICD-10-CM | POA: Diagnosis not present

## 2022-12-20 DIAGNOSIS — D696 Thrombocytopenia, unspecified: Secondary | ICD-10-CM | POA: Diagnosis not present

## 2022-12-20 LAB — CBC
HCT: 33 % — ABNORMAL LOW (ref 39.0–52.0)
Hemoglobin: 11.4 g/dL — ABNORMAL LOW (ref 13.0–17.0)
MCH: 30.1 pg (ref 26.0–34.0)
MCHC: 34.5 g/dL (ref 30.0–36.0)
MCV: 87.1 fL (ref 80.0–100.0)
Platelets: 102 10*3/uL — ABNORMAL LOW (ref 150–400)
RBC: 3.79 MIL/uL — ABNORMAL LOW (ref 4.22–5.81)
RDW: 12.8 % (ref 11.5–15.5)
WBC: 5.3 10*3/uL (ref 4.0–10.5)
nRBC: 0 % (ref 0.0–0.2)

## 2022-12-20 LAB — GLUCOSE, CAPILLARY: Glucose-Capillary: 90 mg/dL (ref 70–99)

## 2022-12-20 LAB — MAGNESIUM: Magnesium: 1.7 mg/dL (ref 1.7–2.4)

## 2022-12-20 LAB — BASIC METABOLIC PANEL
Anion gap: 12 (ref 5–15)
BUN: 5 mg/dL — ABNORMAL LOW (ref 6–20)
CO2: 21 mmol/L — ABNORMAL LOW (ref 22–32)
Calcium: 7.6 mg/dL — ABNORMAL LOW (ref 8.9–10.3)
Chloride: 98 mmol/L (ref 98–111)
Creatinine, Ser: 0.68 mg/dL (ref 0.61–1.24)
GFR, Estimated: >60 mL/min (ref >60)
Glucose, Bld: 84 mg/dL (ref 70–99)
Potassium: 3.4 mmol/L — ABNORMAL LOW (ref 3.5–5.1)
Sodium: 131 mmol/L — ABNORMAL LOW (ref 135–145)

## 2022-12-20 LAB — LIPASE, BLOOD: Lipase: 152 U/L — ABNORMAL HIGH (ref 11–51)

## 2022-12-20 LAB — PHOSPHORUS: Phosphorus: 2.4 mg/dL — ABNORMAL LOW (ref 2.5–4.6)

## 2022-12-20 MED ORDER — CALCIUM CARBONATE ANTACID 500 MG PO CHEW
1.0000 | CHEWABLE_TABLET | Freq: Three times a day (TID) | ORAL | Status: DC
  Administered 2022-12-20 – 2022-12-27 (×21): 200 mg via ORAL
  Filled 2022-12-20 (×21): qty 1

## 2022-12-20 MED ORDER — HYDRALAZINE HCL 20 MG/ML IJ SOLN
5.0000 mg | INTRAMUSCULAR | Status: DC | PRN
  Administered 2022-12-20: 5 mg via INTRAVENOUS
  Filled 2022-12-20: qty 1

## 2022-12-20 MED ORDER — LACTATED RINGERS IV SOLN: INTRAVENOUS | Status: DC

## 2022-12-20 MED ORDER — POTASSIUM CHLORIDE CRYS ER 20 MEQ PO TBCR
40.0000 meq | EXTENDED_RELEASE_TABLET | Freq: Once | ORAL | Status: AC
  Administered 2022-12-20: 40 meq via ORAL
  Filled 2022-12-20: qty 2

## 2022-12-20 NOTE — Progress Notes (Signed)
PROGRESS NOTE    Shane Stiteler Sr.  [id number] DOB: 10-20-1986 DOA: 12/18/2022 PCP: Pediatrics, [facility]   Brief Narrative: Shane Bailiff Sr. is a 37 y.o. male with a history of alcohol abuse in remission, alcoholic pancreatitis with pseudocyst, hyponatremia, GERD, DVT not currently on anticoagulation. Patient presented secondary to abdominal pain and found to have evidence of acute pancreatitis. Bowel rest, IV fluids and analgesics provided. Incidentally, patient was found to have an influenza A infection and started on Tamiflu.   Assessment and Plan:  Acute pancreatitis Abdominal pain with associated Lipase of 4,265 and CT imaging consistent with pancreatitis. Bowel rest and IV analgesic therapy initiated. -Continue bowel rest; NPO -Continue IV fluids -Continue IV analgesics while NPO; switch to oral analgesics once advancing diet  Hematemesis Reported episodes prior to admission. Hemoglobin of 15.4 on admission with downward drift to 10.6 and is currently stable. GI consulted and recommended no inpatient evaluation. No recurrent episodes.  Hyponatremia Mild. Potassium of 128  on admission. Stable. Urine studies suggest likely dehydration as etiology. -Continue IV fluids  Hypokalemia Mild. -Potassium supplementation  Hypocalcemia Likely related to pancreatitis -Calcium carbonate  Thrombocytopenia Mild. Chronic issue. Stable.  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 Recommendation for repeat MRI once acute inflammation has resolved to rule out malignancy. Discussed with patient at bedside.  Influenza A infection -Continue Tamiflu and supportive care  Elevated blood pressure No prior history of hypertension. Patient significant hypertensive this admission, but is asymptomatic. -Hydralazine PRN for symptomatic hypertension  DVT  prophylaxis: SCDs Code Status:   Code Status: Full Code Family Communication: None at bedside Disposition Plan: Discharge home once abdominal pain improved and patient's diet advanced successfully. Anticipate discharge in 2-4 days   Consultants:  Gastroenterology  Procedures:  None  Antimicrobials: Tamiflu    Subjective: Patient reports continued significant abdominal pain  Objective: BP (!) 176/98 (BP Location: Right Arm)   Pulse 77   Temp 98.6 F (37 C) (Oral)   Resp 18   Ht 6' 1"$  (1.854 m)   Wt 65.8 kg   SpO2 99%   BMI 19.13 kg/m   Examination:  General exam: Appears calm and comfortable Respiratory system: Clear to auscultation. Respiratory effort normal. Cardiovascular system: S1 & S2 heard, RRR. No murmurs, rubs, gallops or clicks. Gastrointestinal system: Abdomen is nondistended, soft and nontender. Normal bowel sounds heard. Central nervous system: Alert and oriented. No focal neurological deficits. Musculoskeletal: No edema. No calf tenderness Skin: No cyanosis. No rashes Psychiatry: Judgement and insight appear normal. Mood & affect appropriate.    Data Reviewed: I have personally reviewed following labs and imaging studies  CBC Lab Results  Component Value Date   WBC 5.3 12/20/2022   RBC 3.79 (L) 12/20/2022   HGB 11.4 (L) 12/20/2022   HCT 33.0 (L) 12/20/2022   MCV 87.1 12/20/2022   MCH 30.1 12/20/2022   PLT 102 (L) 12/20/2022   MCHC 34.5 12/20/2022   RDW 12.8 12/20/2022   LYMPHSABS 1.5 07/02/2022   MONOABS 0.3 07/02/2022   EOSABS 0.3 07/02/2022   BASOSABS 0.0 99991111     Last metabolic panel Lab Results  Component Value Date   NA 131 (L) 12/20/2022   K 3.4 (L) 12/20/2022   CL 98 12/20/2022   CO2 21 (L) 12/20/2022   BUN 5 (L) 12/20/2022   CREATININE 0.68 12/20/2022   GLUCOSE 84 12/20/2022  GFRNONAA >60 12/20/2022   GFRAA >60 05/28/2020   CALCIUM 7.6 (L) 12/20/2022   PHOS 2.4 (L) 12/20/2022   PROT 7.7 12/18/2022   ALBUMIN 4.2  12/18/2022   BILITOT 1.0 12/18/2022   ALKPHOS 59 12/18/2022   AST 35 12/18/2022   ALT 18 12/18/2022   ANIONGAP 12 12/20/2022    GFR: Estimated Creatinine Clearance: 118.8 mL/min (by C-G formula based on SCr of 0.68 mg/dL).  Recent Results (from the past 240 hour(s))  Resp panel by RT-PCR (RSV, Flu A&B, Covid) Anterior Nasal Swab     Status: Abnormal   Collection Time: 12/18/22 11:11 AM   Specimen: Anterior Nasal Swab  Result Value Ref Range Status   SARS Coronavirus 2 by RT PCR NEGATIVE NEGATIVE Final    Comment: (NOTE) SARS-CoV-2 target nucleic acids are NOT DETECTED.  The SARS-CoV-2 RNA is generally detectable in upper respiratory specimens during the acute phase of infection. The lowest concentration of SARS-CoV-2 viral copies this assay can detect is 138 copies/mL. A negative result does not preclude SARS-Cov-2 infection and should not be used as the sole basis for treatment or other patient management decisions. A negative result may occur with  improper specimen collection/handling, submission of specimen other than nasopharyngeal swab, presence of viral mutation(s) within the areas targeted by this assay, and inadequate number of viral copies(<138 copies/mL). A negative result must be combined with clinical observations, patient history, and epidemiological information. The expected result is Negative.  Fact Sheet for Patients:  EntrepreneurPulse.com.au  Fact Sheet for Healthcare Providers:  IncredibleEmployment.be  This test is no t yet approved or cleared by the [place] FDA and  has been authorized for detection and/or diagnosis of SARS-CoV-2 by FDA under an Emergency Use Authorization (EUA). This EUA will remain  in effect (meaning this test can be used) for the duration of the COVID-19 declaration under Section 564(b)(1) of the Act, 21 U.S.C.section 360bbb-3(b)(1), unless the authorization is terminated  or revoked  sooner.       Influenza A by PCR POSITIVE (A) NEGATIVE Final   Influenza B by PCR NEGATIVE NEGATIVE Final    Comment: (NOTE) The Xpert Xpress SARS-CoV-2/FLU/RSV plus assay is intended as an aid in the diagnosis of influenza from Nasopharyngeal swab specimens and should not be used as a sole basis for treatment. Nasal washings and aspirates are unacceptable for Xpert Xpress SARS-CoV-2/FLU/RSV testing.  Fact Sheet for Patients: EntrepreneurPulse.com.au  Fact Sheet for Healthcare Providers: IncredibleEmployment.be  This test is not yet approved or cleared by the [place] FDA and has been authorized for detection and/or diagnosis of SARS-CoV-2 by FDA under an Emergency Use Authorization (EUA). This EUA will remain in effect (meaning this test can be used) for the duration of the COVID-19 declaration under Section 564(b)(1) of the Act, 21 U.S.C. section 360bbb-3(b)(1), unless the authorization is terminated or revoked.     Resp Syncytial Virus by PCR NEGATIVE NEGATIVE Final    Comment: (NOTE) Fact Sheet for Patients: EntrepreneurPulse.com.au  Fact Sheet for Healthcare Providers: IncredibleEmployment.be  This test is not yet approved or cleared by the [place] FDA and has been authorized for detection and/or diagnosis of SARS-CoV-2 by FDA under an Emergency Use Authorization (EUA). This EUA will remain in effect (meaning this test can be used) for the duration of the COVID-19 declaration under Section 564(b)(1) of the Act, 21 U.S.C. section 360bbb-3(b)(1), unless the authorization is terminated or revoked.  Performed at [facility], [address], [place], [place] [address]  Radiology Studies: No results found.    LOS: 2 days    Cordelia Poche, MD [facility] 12/20/2022, 2:36 PM   If 7PM-7AM, please contact night-coverage [email or web address]

## 2022-12-20 NOTE — Hospital Course (Addendum)
Shane Rodkey Sr. is a 37 y.o. male with a history of alcohol abuse in remission, alcoholic pancreatitis with pseudocyst, hyponatremia, GERD, DVT not currently on anticoagulation. Patient presented to the emergency room on 2/8 with midepigastric abdominal pain and found to have acute pancreatitis.  Also found to have influenza A.    By 2/11, patient started to feel better.  Diet started on clear liquids and initial attempt to advance diet very slowly due to patient recurrent pain and nausea.  Gastroenterology consulted.  Patient started on Creon.

## 2022-12-21 DIAGNOSIS — K859 Acute pancreatitis without necrosis or infection, unspecified: Secondary | ICD-10-CM | POA: Diagnosis not present

## 2022-12-21 LAB — COMPREHENSIVE METABOLIC PANEL
ALT: 11 U/L (ref 0–44)
AST: 19 U/L (ref 15–41)
Albumin: 2.9 g/dL — ABNORMAL LOW (ref 3.5–5.0)
Alkaline Phosphatase: 44 U/L (ref 38–126)
Anion gap: 11 (ref 5–15)
BUN: <5 mg/dL — ABNORMAL LOW (ref 6–20)
CO2: 20 mmol/L — ABNORMAL LOW (ref 22–32)
Calcium: 8.3 mg/dL — ABNORMAL LOW (ref 8.9–10.3)
Chloride: 99 mmol/L (ref 98–111)
Creatinine, Ser: 0.68 mg/dL (ref 0.61–1.24)
GFR, Estimated: >60 mL/min (ref >60)
Glucose, Bld: 83 mg/dL (ref 70–99)
Potassium: 3.6 mmol/L (ref 3.5–5.1)
Sodium: 130 mmol/L — ABNORMAL LOW (ref 135–145)
Total Bilirubin: 1.3 mg/dL — ABNORMAL HIGH (ref 0.3–1.2)
Total Protein: 6.1 g/dL — ABNORMAL LOW (ref 6.5–8.1)

## 2022-12-21 LAB — CBC
HCT: 34.3 % — ABNORMAL LOW (ref 39.0–52.0)
Hemoglobin: 12.3 g/dL — ABNORMAL LOW (ref 13.0–17.0)
MCH: 30.8 pg (ref 26.0–34.0)
MCHC: 35.9 g/dL (ref 30.0–36.0)
MCV: 86 fL (ref 80.0–100.0)
Platelets: 144 10*3/uL — ABNORMAL LOW (ref 150–400)
RBC: 3.99 MIL/uL — ABNORMAL LOW (ref 4.22–5.81)
RDW: 12.3 % (ref 11.5–15.5)
WBC: 5.2 10*3/uL (ref 4.0–10.5)
nRBC: 0 % (ref 0.0–0.2)

## 2022-12-21 LAB — GLUCOSE, CAPILLARY: Glucose-Capillary: 78 mg/dL (ref 70–99)

## 2022-12-21 MED ORDER — HYDRALAZINE HCL 20 MG/ML IJ SOLN
5.0000 mg | INTRAMUSCULAR | Status: DC | PRN
  Administered 2022-12-21 – 2022-12-22 (×2): 5 mg via INTRAVENOUS
  Filled 2022-12-21 (×3): qty 1

## 2022-12-21 NOTE — Progress Notes (Signed)
PROGRESS NOTE    Shane Muscato Sr.  [id number] DOB: 03-Oct-1986 DOA: 12/18/2022 PCP: Pediatrics, [facility]   Brief Narrative: Shane Bailiff Sr. is a 37 y.o. male with a history of alcohol abuse in remission, alcoholic pancreatitis with pseudocyst, hyponatremia, GERD, DVT not currently on anticoagulation. Patient presented secondary to abdominal pain and found to have evidence of acute pancreatitis. Bowel rest, IV fluids and analgesics provided. Incidentally, patient was found to have an influenza A infection and started on Tamiflu.  2/11: Blood pressure remained elevated, pain can be contributory.  Some improvement in nausea and vomiting.  CT scan with concern of enlarging pancreatic mass, apparently prior C 19-9 was little elevated and GI recommended repeating CT abdomen which was scheduled couple of months ago but patient never showed up.  Repeat CA 19-9 was ordered by GI. Patient need a pancreatic MRI to rule out any pancreatic malignancy once acute inflammation subsides.  Had another discussion with patient today. Per patient he has stopped drinking. Starting him on clear liquid diet and we will slowly progress as tolerated.   Assessment and Plan:  Acute pancreatitis Abdominal pain with associated Lipase of 4,265 and CT imaging consistent with pancreatitis. Bowel rest and IV analgesic therapy initiated.  Lipase with significant improvement. -Start him on clear liquid -Continue IV fluids-we will stop once started taking p.o. -Continue IV analgesics   Hematemesis Reported episodes prior to admission. Hemoglobin of 15.4 on admission with downward drift to 10.6 and is currently improving, at 12.3 today. GI consulted and recommended no inpatient evaluation. No recurrent episodes.  Hyponatremia Mild. Potassium of 128  on admission. Stable and slowly improving. Urine studies suggest likely dehydration as etiology. -Continue IV fluids  Hypokalemia Improved with  repletion.  Hypocalcemia. Improving Likely related to pancreatitis -Continue calcium carbonate  Thrombocytopenia Mild. Chronic issue. Stable.  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 Recommendation for repeat MRI once acute inflammation has resolved to rule out malignancy. Discussed with patient at bedside. Had mildly elevated CA 19-9 few months ago, repeat CT scan was arranged by GI but patient never showed up. -Repeat CA 19 9 was ordered by GI-pending results.  Influenza A infection -Continue Tamiflu and supportive care  Elevated blood pressure No prior history of hypertension. Patient significant hypertensive this admission, but is asymptomatic. -Hydralazine PRN for symptomatic hypertension -Will add an agent once start taking p.o. and if blood pressure remained elevated with no pain  DVT prophylaxis: SCDs Code Status:   Code Status: Full Code Family Communication: None at bedside Disposition Plan: Discharge home once abdominal pain improved and patient's diet advanced successfully. Anticipate discharge in 2-4 days   Consultants:  Gastroenterology  Procedures:  None  Antimicrobials: Tamiflu    Subjective: Patient was seen and examined today.  No more nausea or vomiting.  Having some lower abdominal pain.  Objective: BP (!) 141/85 (BP Location: Right Arm)   Pulse 83   Temp 98.9 F (37.2 C) (Oral)   Resp 16   Ht 6' 1"$  (1.854 m)   Wt 65.8 kg   SpO2 97%   BMI 19.13 kg/m   Examination:  General.     In no acute distress. Pulmonary.  Few scattered rhonchi, bilaterally, normal respiratory effort. CV.  Regular rate and rhythm, no JVD, rub or murmur. Abdomen.  Soft, nontender, nondistended, BS positive. CNS.  Alert and oriented .  No focal neurologic deficit. Extremities.  No edema, no cyanosis, pulses intact and symmetrical. Psychiatry.   Judgment and insight appears normal.    Data Reviewed: I have personally reviewed following labs and imaging studies  CBC Lab Results  Component Value Date   WBC 5.2 12/21/2022   RBC 3.99 (L) 12/21/2022   HGB 12.3 (L) 12/21/2022   HCT 34.3 (L) 12/21/2022   MCV 86.0 12/21/2022   MCH 30.8 12/21/2022   PLT 144 (L) 12/21/2022   MCHC 35.9 12/21/2022   RDW 12.3 12/21/2022   LYMPHSABS 1.5 07/02/2022   MONOABS 0.3 07/02/2022   EOSABS 0.3 07/02/2022   BASOSABS 0.0 99991111     Last metabolic panel Lab Results  Component Value Date   NA 130 (L) 12/21/2022   K 3.6 12/21/2022   CL 99 12/21/2022   CO2 20 (L) 12/21/2022   BUN <5 (L) 12/21/2022   CREATININE 0.68 12/21/2022   GLUCOSE 83 12/21/2022   GFRNONAA >60 12/21/2022   GFRAA >60 05/28/2020   CALCIUM 8.3 (L) 12/21/2022   PHOS 2.4 (L) 12/20/2022   PROT 6.1 (L) 12/21/2022   ALBUMIN 2.9 (L) 12/21/2022   BILITOT 1.3 (H) 12/21/2022   ALKPHOS 44 12/21/2022   AST 19 12/21/2022   ALT 11 12/21/2022   ANIONGAP 11 12/21/2022    GFR: Estimated Creatinine Clearance: 118.8 mL/min (by C-G formula based on SCr of 0.68 mg/dL).  Recent Results (from the past 240 hour(s))  Resp panel by RT-PCR (RSV, Flu A&B, Covid) Anterior Nasal Swab     Status: Abnormal   Collection Time: 12/18/22 11:11 AM   Specimen: Anterior Nasal Swab  Result Value Ref Range Status   SARS Coronavirus 2 by RT PCR NEGATIVE NEGATIVE Final    Comment: (NOTE) SARS-CoV-2 target nucleic acids are NOT DETECTED.  The SARS-CoV-2 RNA is generally detectable in upper respiratory specimens during the acute phase of infection. The lowest concentration of SARS-CoV-2 viral copies this assay can detect is 138 copies/mL. A negative result does not preclude SARS-Cov-2 infection and should not be used as the sole basis for treatment or other patient management decisions. A negative result may occur with  improper specimen collection/handling, submission of specimen other than  nasopharyngeal swab, presence of viral mutation(s) within the areas targeted by this assay, and inadequate number of viral copies(<138 copies/mL). A negative result must be combined with clinical observations, patient history, and epidemiological information. The expected result is Negative.  Fact Sheet for Patients:  EntrepreneurPulse.com.au  Fact Sheet for Healthcare Providers:  IncredibleEmployment.be  This test is no t yet approved or cleared by the [place] FDA and  has been authorized for detection and/or diagnosis of SARS-CoV-2 by FDA under an Emergency Use Authorization (EUA). This EUA will remain  in effect (meaning this test can be used) for the duration of the COVID-19 declaration under Section 564(b)(1) of the Act, 21 U.S.C.section 360bbb-3(b)(1), unless the authorization is terminated  or revoked sooner.       Influenza A by PCR POSITIVE (A) NEGATIVE Final   Influenza B by PCR NEGATIVE NEGATIVE Final    Comment: (NOTE) The Xpert Xpress SARS-CoV-2/FLU/RSV plus assay is intended as an aid in the diagnosis of influenza from Nasopharyngeal swab specimens and should not be used as a sole basis for treatment. Nasal washings and aspirates are unacceptable for Xpert Xpress SARS-CoV-2/FLU/RSV testing.  Fact Sheet for Patients: EntrepreneurPulse.com.au  Fact Sheet for Healthcare Providers: IncredibleEmployment.be  This test is not yet approved or cleared by  the [employer] and has been authorized for detection and/or diagnosis of SARS-CoV-2 by FDA under an Emergency Use Authorization (EUA). This EUA will remain in effect (meaning this test can be used) for the duration of the COVID-19 declaration under Section 564(b)(1) of the Act, 21 U.S.C. section 360bbb-3(b)(1), unless the authorization is terminated or revoked.     Resp Syncytial Virus by PCR NEGATIVE NEGATIVE Final    Comment:  (NOTE) Fact Sheet for Patients: EntrepreneurPulse.com.au  Fact Sheet for Healthcare Providers: IncredibleEmployment.be  This test is not yet approved or cleared by the [place] FDA and has been authorized for detection and/or diagnosis of SARS-CoV-2 by FDA under an Emergency Use Authorization (EUA). This EUA will remain in effect (meaning this test can be used) for the duration of the COVID-19 declaration under Section 564(b)(1) of the Act, 21 U.S.C. section 360bbb-3(b)(1), unless the authorization is terminated or revoked.  Performed at [facility], [address] [place], [place] [address]       Radiology Studies: No results found.    LOS: 3 days   This record has been created using Systems analyst. Errors have been sought and corrected,but may not always be located. Such creation errors do not reflect on the standard of care.   Lorella Nimrod, MD [facility] 12/21/2022, 4:08 PM   If 7PM-7AM, please contact night-coverage [email or web address]

## 2022-12-22 DIAGNOSIS — K859 Acute pancreatitis without necrosis or infection, unspecified: Secondary | ICD-10-CM | POA: Diagnosis not present

## 2022-12-22 LAB — COMPREHENSIVE METABOLIC PANEL
ALT: 11 U/L (ref 0–44)
AST: 16 U/L (ref 15–41)
Albumin: 2.7 g/dL — ABNORMAL LOW (ref 3.5–5.0)
Alkaline Phosphatase: 42 U/L (ref 38–126)
Anion gap: 8 (ref 5–15)
BUN: <5 mg/dL — ABNORMAL LOW (ref 6–20)
CO2: 24 mmol/L (ref 22–32)
Calcium: 8.5 mg/dL — ABNORMAL LOW (ref 8.9–10.3)
Chloride: 104 mmol/L (ref 98–111)
Creatinine, Ser: 0.67 mg/dL (ref 0.61–1.24)
GFR, Estimated: >60 mL/min (ref >60)
Glucose, Bld: 105 mg/dL — ABNORMAL HIGH (ref 70–99)
Potassium: 3.5 mmol/L (ref 3.5–5.1)
Sodium: 136 mmol/L (ref 135–145)
Total Bilirubin: 0.7 mg/dL (ref 0.3–1.2)
Total Protein: 5.7 g/dL — ABNORMAL LOW (ref 6.5–8.1)

## 2022-12-22 LAB — GLUCOSE, CAPILLARY: Glucose-Capillary: 110 mg/dL — ABNORMAL HIGH (ref 70–99)

## 2022-12-22 LAB — CA 19-9 (SERIAL): CA 19-9: 12 U/mL (ref 0–35)

## 2022-12-22 MED ORDER — LOSARTAN POTASSIUM 25 MG PO TABS
25.0000 mg | ORAL_TABLET | Freq: Every day | ORAL | Status: DC
  Administered 2022-12-22 – 2022-12-27 (×6): 25 mg via ORAL
  Filled 2022-12-22 (×6): qty 1

## 2022-12-22 NOTE — Progress Notes (Signed)
PROGRESS NOTE    Shane Reinwald Sr.  [id number] DOB: 03-Jan-1986 DOA: 12/18/2022 PCP: Pediatrics, [facility]   Brief Narrative: Shane Bailiff Sr. is a 37 y.o. male with a history of alcohol abuse in remission, alcoholic pancreatitis with pseudocyst, hyponatremia, GERD, DVT not currently on anticoagulation. Patient presented secondary to abdominal pain and found to have evidence of acute pancreatitis. Bowel rest, IV fluids and analgesics provided. Incidentally, patient was found to have an influenza A infection and started on Tamiflu.  2/11: Blood pressure remained elevated, pain can be contributory.  Some improvement in nausea and vomiting.  CT scan with concern of enlarging pancreatic mass, apparently prior C 19-9 was little elevated and GI recommended repeating CT abdomen which was scheduled couple of months ago but patient never showed up.  Repeat CA 19-9 was ordered by GI. Patient need a pancreatic MRI to rule out any pancreatic malignancy once acute inflammation subsides.  Had another discussion with patient today. Per patient he has stopped drinking. Starting him on clear liquid diet and we will slowly progress as tolerated.  2/12: Blood pressure remained elevated at 161/97, starting him on losartan 25 mg daily. Tolerating clear liquid well, diet advanced to full liquid, he tolerated full liquid but developed pain and vomiting with soft diet.  Diet was downgraded again to full liquid. Continue to have lower abdominal pain and some diarrhea.   Assessment and Plan:  Acute pancreatitis Abdominal pain with associated Lipase of 4,265 and CT imaging consistent with pancreatitis. Bowel rest and IV analgesic therapy initiated.  Lipase with significant improvement. -Diet advanced to full liquid -Continue IV fluids-we will stop once started taking sufficient p.o. -Continue IV analgesics   Hematemesis Reported episodes prior to admission. Hemoglobin of 15.4 on admission with  downward drift to 10.6 and is currently improving, at 12.3 today. GI consulted and recommended no inpatient evaluation. No recurrent episodes.  Hyponatremia Resolved with IV fluid  Urine studies suggest likely dehydration as etiology. -Continue IV fluids  Hypokalemia Improved with repletion.  Hypocalcemia. Improving Likely related to pancreatitis -Continue calcium carbonate  Thrombocytopenia Mild. Chronic issue. Stable.  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 Recommendation for repeat MRI once acute inflammation has resolved to rule out malignancy. Discussed with patient at bedside. Had mildly elevated CA 19-9 few months ago, repeat CT scan was arranged by GI but patient never showed up. -Repeat CA 19 9 are normal  Influenza A infection -Continue Tamiflu and supportive care  Elevated blood pressure No prior history of hypertension. Patient significant hypertensive this admission, but is asymptomatic. -Hydralazine PRN for symptomatic hypertension -Add low-dose losartan  DVT prophylaxis: SCDs Code Status:   Code Status: Full Code Family Communication: None at bedside Disposition Plan: Discharge home once abdominal pain improved and patient's diet advanced successfully. Anticipate discharge in 2-4 days   Consultants:  Gastroenterology  Procedures:  None  Antimicrobials: Tamiflu    Subjective: Patient was seen and examined today.  He tolerated full liquid diet with breakfast and would trial like to try soft diet.  Continue to have lower abdominal pain.  Had 1 episode of diarrhea last night.  Objective: BP (!) 161/97 (BP Location: Right Arm)   Pulse 75   Temp 98.4 F (36.9 C)   Resp 17   Ht 6' 1"$  (1.854 m)   Wt 65.8 kg   SpO2 100%   BMI  19.13 kg/m   Examination:  General.  Well-developed gentleman, in no acute distress. Pulmonary.   Lungs clear bilaterally, normal respiratory effort. CV.  Regular rate and rhythm, no JVD, rub or murmur. Abdomen.  Soft, nontender, nondistended, BS positive. CNS.  Alert and oriented .  No focal neurologic deficit. Extremities.  No edema, no cyanosis, pulses intact and symmetrical. Psychiatry.  Judgment and insight appears normal.    Data Reviewed: I have personally reviewed following labs and imaging studies  CBC Lab Results  Component Value Date   WBC 5.2 12/21/2022   RBC 3.99 (L) 12/21/2022   HGB 12.3 (L) 12/21/2022   HCT 34.3 (L) 12/21/2022   MCV 86.0 12/21/2022   MCH 30.8 12/21/2022   PLT 144 (L) 12/21/2022   MCHC 35.9 12/21/2022   RDW 12.3 12/21/2022   LYMPHSABS 1.5 07/02/2022   MONOABS 0.3 07/02/2022   EOSABS 0.3 07/02/2022   BASOSABS 0.0 99991111     Last metabolic panel Lab Results  Component Value Date   NA 136 12/22/2022   K 3.5 12/22/2022   CL 104 12/22/2022   CO2 24 12/22/2022   BUN <5 (L) 12/22/2022   CREATININE 0.67 12/22/2022   GLUCOSE 105 (H) 12/22/2022   GFRNONAA >60 12/22/2022   GFRAA >60 05/28/2020   CALCIUM 8.5 (L) 12/22/2022   PHOS 2.4 (L) 12/20/2022   PROT 5.7 (L) 12/22/2022   ALBUMIN 2.7 (L) 12/22/2022   BILITOT 0.7 12/22/2022   ALKPHOS 42 12/22/2022   AST 16 12/22/2022   ALT 11 12/22/2022   ANIONGAP 8 12/22/2022    GFR: Estimated Creatinine Clearance: 118.8 mL/min (by C-G formula based on SCr of 0.67 mg/dL).  Recent Results (from the past 240 hour(s))  Resp panel by RT-PCR (RSV, Flu A&B, Covid) Anterior Nasal Swab     Status: Abnormal   Collection Time: 12/18/22 11:11 AM   Specimen: Anterior Nasal Swab  Result Value Ref Range Status   SARS Coronavirus 2 by RT PCR NEGATIVE NEGATIVE Final    Comment: (NOTE) SARS-CoV-2 target nucleic acids are NOT DETECTED.  The SARS-CoV-2 RNA is generally detectable in upper respiratory specimens during the acute phase of infection. The lowest concentration of SARS-CoV-2 viral copies this  assay can detect is 138 copies/mL. A negative result does not preclude SARS-Cov-2 infection and should not be used as the sole basis for treatment or other patient management decisions. A negative result may occur with  improper specimen collection/handling, submission of specimen other than nasopharyngeal swab, presence of viral mutation(s) within the areas targeted by this assay, and inadequate number of viral copies(<138 copies/mL). A negative result must be combined with clinical observations, patient history, and epidemiological information. The expected result is Negative.  Fact Sheet for Patients:  EntrepreneurPulse.com.au  Fact Sheet for Healthcare Providers:  IncredibleEmployment.be  This test is no t yet approved or cleared by the [place] FDA and  has been authorized for detection and/or diagnosis of SARS-CoV-2 by FDA under an Emergency Use Authorization (EUA). This EUA will remain  in effect (meaning this test can be used) for the duration of the COVID-19 declaration under Section 564(b)(1) of the Act, 21 U.S.C.section 360bbb-3(b)(1), unless the authorization is terminated  or revoked sooner.       Influenza A by PCR POSITIVE (A) NEGATIVE Final   Influenza B by PCR NEGATIVE NEGATIVE Final    Comment: (NOTE) The Xpert Xpress SARS-CoV-2/FLU/RSV plus assay is intended as an aid in the diagnosis of influenza from Nasopharyngeal swab specimens  and should not be used as a sole basis for treatment. Nasal washings and aspirates are unacceptable for Xpert Xpress SARS-CoV-2/FLU/RSV testing.  Fact Sheet for Patients: EntrepreneurPulse.com.au  Fact Sheet for Healthcare Providers: IncredibleEmployment.be  This test is not yet approved or cleared by the [place] FDA and has been authorized for detection and/or diagnosis of SARS-CoV-2 by FDA under an Emergency Use Authorization (EUA). This EUA  will remain in effect (meaning this test can be used) for the duration of the COVID-19 declaration under Section 564(b)(1) of the Act, 21 U.S.C. section 360bbb-3(b)(1), unless the authorization is terminated or revoked.     Resp Syncytial Virus by PCR NEGATIVE NEGATIVE Final    Comment: (NOTE) Fact Sheet for Patients: EntrepreneurPulse.com.au  Fact Sheet for Healthcare Providers: IncredibleEmployment.be  This test is not yet approved or cleared by the [place] FDA and has been authorized for detection and/or diagnosis of SARS-CoV-2 by FDA under an Emergency Use Authorization (EUA). This EUA will remain in effect (meaning this test can be used) for the duration of the COVID-19 declaration under Section 564(b)(1) of the Act, 21 U.S.C. section 360bbb-3(b)(1), unless the authorization is terminated or revoked.  Performed at [facility], [address], [place],  [address]       Radiology Studies: No results found.    LOS: 4 days   This record has been created using Systems analyst. Errors have been sought and corrected,but may not always be located. Such creation errors do not reflect on the standard of care.   Lorella Nimrod, MD [facility] 12/22/2022, 2:13 PM   If 7PM-7AM, please contact night-coverage [email or web address]

## 2022-12-23 ENCOUNTER — Encounter: Payer: Self-pay | Admitting: Internal Medicine

## 2022-12-23 ENCOUNTER — Inpatient Hospital Stay: Payer: Medicaid Other

## 2022-12-23 DIAGNOSIS — K863 Pseudocyst of pancreas: Secondary | ICD-10-CM

## 2022-12-23 DIAGNOSIS — K859 Acute pancreatitis without necrosis or infection, unspecified: Secondary | ICD-10-CM

## 2022-12-23 DIAGNOSIS — K861 Other chronic pancreatitis: Secondary | ICD-10-CM | POA: Diagnosis not present

## 2022-12-23 LAB — GLUCOSE, CAPILLARY
Glucose-Capillary: 98 mg/dL (ref 70–99)
Glucose-Capillary: 108 mg/dL — ABNORMAL HIGH (ref 70–99)

## 2022-12-23 MED ORDER — PANCRELIPASE (LIP-PROT-AMYL) 12000-38000 UNITS PO CPEP: 36000.0000 [IU] | ORAL_CAPSULE | Freq: Three times a day (TID) | ORAL | Status: DC

## 2022-12-23 MED ORDER — MELATONIN 5 MG PO TABS
5.0000 mg | ORAL_TABLET | Freq: Once | ORAL | Status: AC
  Administered 2022-12-23: 5 mg via ORAL
  Filled 2022-12-23: qty 1

## 2022-12-23 MED ORDER — MORPHINE SULFATE ER 15 MG PO TBCR
15.0000 mg | EXTENDED_RELEASE_TABLET | Freq: Two times a day (BID) | ORAL | Status: DC
  Administered 2022-12-23 – 2022-12-25 (×5): 15 mg via ORAL
  Filled 2022-12-23 (×5): qty 1

## 2022-12-23 MED ORDER — PANCRELIPASE (LIP-PROT-AMYL) 36000-114000 UNITS PO CPEP
72000.0000 [IU] | ORAL_CAPSULE | Freq: Three times a day (TID) | ORAL | Status: DC
  Administered 2022-12-24 – 2022-12-27 (×10): 72000 [IU] via ORAL
  Filled 2022-12-23 (×4): qty 6
  Filled 2022-12-23: qty 2
  Filled 2022-12-23: qty 6
  Filled 2022-12-23: qty 2
  Filled 2022-12-23 (×2): qty 6
  Filled 2022-12-23 (×2): qty 2
  Filled 2022-12-23: qty 6

## 2022-12-23 MED ORDER — ORAL CARE MOUTH RINSE: 15.0000 mL | OROMUCOSAL | Status: DC | PRN

## 2022-12-23 MED ORDER — IOHEXOL 300 MG/ML  SOLN
80.0000 mL | Freq: Once | INTRAMUSCULAR | Status: AC | PRN
  Administered 2022-12-23: 80 mL via INTRAVENOUS

## 2022-12-23 MED ORDER — IOHEXOL 9 MG/ML PO SOLN
500.0000 mL | ORAL | Status: AC
  Administered 2022-12-23 (×2): 500 mL via ORAL

## 2022-12-23 NOTE — Progress Notes (Signed)
Cephas Darby, MD [address]  [place]  [place], [place] [address]  Main: [phone]  Fax: [phone] Pager: [phone]   Subjective: I was asked to reconsult on Mr. Eddrick Bolek because of worsening abdominal pain.  He is admitted originally on 2/8 secondary to acute alcoholic pancreatitis.  Patient has known history of acute on chronic pancreatitis with pseudocyst.  Patient reports worsening of abdominal pain today.  He underwent repeat CT abdomen pelvis with contrast this afternoon.  When I saw the patient, he denied any nausea, reports worsening of pain with any food in his stomach.  Patient reports having 2 loose to formed bowel movements today   Objective: Vital signs in last 24 hours: Vitals:   12/23/22 0425 12/23/22 0633 12/23/22 0730 12/23/22 1544  BP: (!) 152/96  (!) 140/93 (!) 152/98  Pulse: 75  73 76  Resp: 19  18 18  $ Temp: 98.3 F (36.8 C)  98.4 F (36.9 C) 98.4 F (36.9 C)  TempSrc: Oral  Oral   SpO2: 97% 99% 97% 98%  Weight:      Height:       Weight change:  No intake or output data in the 24 hours ending 12/23/22 1620  Exam: Heart:: Regular rate and rhythm, S1S2 present, or without murmur or extra heart sounds Lungs: normal Abdomen: Generalized abdominal tenderness, voluntary guarding   Lab Results:    Latest Ref Rng & Units 12/21/2022    2:17 AM 12/20/2022    8:39 AM 12/19/2022    6:00 PM  CBC  WBC 4.0 - 10.5 K/uL 5.2  5.3  4.7   Hemoglobin 13.0 - 17.0 g/dL 12.3  11.4  10.9   Hematocrit 39.0 - 52.0 % 34.3  33.0  32.9   Platelets 150 - 400 K/uL 144  102  85       Latest Ref Rng & Units 12/22/2022    8:32 AM 12/21/2022    2:17 AM 12/20/2022    8:39 AM  CMP  Glucose 70 - 99 mg/dL 105  83  84   BUN 6 - 20 mg/dL <5  <5  5   Creatinine 0.61 - 1.24 mg/dL 0.67  0.68  0.68   Sodium 135 - 145 mmol/L 136  130  131   Potassium 3.5 - 5.1 mmol/L 3.5  3.6  3.4   Chloride 98 - 111 mmol/L 104  99  98   CO2 22 - 32 mmol/L 24  20  21   $ Calcium  8.9 - 10.3 mg/dL 8.5  8.3  7.6   Total Protein 6.5 - 8.1 g/dL 5.7  6.1    Total Bilirubin 0.3 - 1.2 mg/dL 0.7  1.3    Alkaline Phos 38 - 126 U/L 42  44    AST 15 - 41 U/L 16  19    ALT 0 - 44 U/L 11  11      Micro Results: Recent Results (from the past 240 hour(s))  Resp panel by RT-PCR (RSV, Flu A&B, Covid) Anterior Nasal Swab     Status: Abnormal   Collection Time: 12/18/22 11:11 AM   Specimen: Anterior Nasal Swab  Result Value Ref Range Status   SARS Coronavirus 2 by RT PCR NEGATIVE NEGATIVE Final    Comment: (NOTE) SARS-CoV-2 target nucleic acids are NOT DETECTED.  The SARS-CoV-2 RNA is generally detectable in upper respiratory specimens during the acute phase of infection. The lowest concentration of SARS-CoV-2 viral copies this assay  can detect is 138 copies/mL. A negative result does not preclude SARS-Cov-2 infection and should not be used as the sole basis for treatment or other patient management decisions. A negative result may occur with  improper specimen collection/handling, submission of specimen other than nasopharyngeal swab, presence of viral mutation(s) within the areas targeted by this assay, and inadequate number of viral copies(<138 copies/mL). A negative result must be combined with clinical observations, patient history, and epidemiological information. The expected result is Negative.  Fact Sheet for Patients:  EntrepreneurPulse.com.au  Fact Sheet for Healthcare Providers:  IncredibleEmployment.be  This test is no t yet approved or cleared by the [place] FDA and  has been authorized for detection and/or diagnosis of SARS-CoV-2 by FDA under an Emergency Use Authorization (EUA). This EUA will remain  in effect (meaning this test can be used) for the duration of the COVID-19 declaration under Section 564(b)(1) of the Act, 21 U.S.C.section 360bbb-3(b)(1), unless the authorization is terminated  or revoked sooner.        Influenza A by PCR POSITIVE (A) NEGATIVE Final   Influenza B by PCR NEGATIVE NEGATIVE Final    Comment: (NOTE) The Xpert Xpress SARS-CoV-2/FLU/RSV plus assay is intended as an aid in the diagnosis of influenza from Nasopharyngeal swab specimens and should not be used as a sole basis for treatment. Nasal washings and aspirates are unacceptable for Xpert Xpress SARS-CoV-2/FLU/RSV testing.  Fact Sheet for Patients: EntrepreneurPulse.com.au  Fact Sheet for Healthcare Providers: IncredibleEmployment.be  This test is not yet approved or cleared by the [place] FDA and has been authorized for detection and/or diagnosis of SARS-CoV-2 by FDA under an Emergency Use Authorization (EUA). This EUA will remain in effect (meaning this test can be used) for the duration of the COVID-19 declaration under Section 564(b)(1) of the Act, 21 U.S.C. section 360bbb-3(b)(1), unless the authorization is terminated or revoked.     Resp Syncytial Virus by PCR NEGATIVE NEGATIVE Final    Comment: (NOTE) Fact Sheet for Patients: EntrepreneurPulse.com.au  Fact Sheet for Healthcare Providers: IncredibleEmployment.be  This test is not yet approved or cleared by the [place] FDA and has been authorized for detection and/or diagnosis of SARS-CoV-2 by FDA under an Emergency Use Authorization (EUA). This EUA will remain in effect (meaning this test can be used) for the duration of the COVID-19 declaration under Section 564(b)(1) of the Act, 21 U.S.C. section 360bbb-3(b)(1), unless the authorization is terminated or revoked.  Performed at [facility], [address] [place], [place] [address]    Studies/Results: CT ABDOMEN PELVIS W CONTRAST  Result Date: 12/23/2022 CLINICAL DATA:  Alcohol abuse, in remission. Alcoholic pancreatitis with pseudocyst. EXAM: CT ABDOMEN AND PELVIS WITH CONTRAST TECHNIQUE:  Multidetector CT imaging of the abdomen and pelvis was performed using the standard protocol following bolus administration of intravenous contrast. RADIATION DOSE REDUCTION: This exam was performed according to the departmental dose-optimization program which includes automated exposure control, adjustment of the mA and/or kV according to patient size and/or use of iterative reconstruction technique. CONTRAST:  79m OMNIPAQUE IOHEXOL 300 MG/ML  SOLN COMPARISON:  12/18/2022.  04/11/2022. FINDINGS: Lower chest: Dependent left base subsegmental atelectasis. Normal heart size with trace left pleural fluid. Hepatobiliary: Normal liver. Normal gallbladder, without biliary ductal dilatation. Pancreas: Again identified is borderline to mild pancreatic duct dilatation within the body, tail, and neck. Followed to the level of the pancreatic head where it becomes more normal in caliber. Pancreatic uncinate process cystic lesion measures 2.9 x 2.9 cm on  30/2 versus 3.9 x 3.2 cm on the prior exam. More well encapsulated today with suggestion of peripheral enhancement. Probable debris within its dependent portion on 31/2. Anterior pancreatic head cystic lesion of 9 mm on 30/2 is similar to 10 mm on the prior. Peripancreatic edema is most apparent adjacent the head and uncinate process, felt to be slightly decreased. No pancreatic necrosis. Relatively ill-defined fluid within the gastrosplenic ligament measures 11.5 x 7.9 cm on 17/2. Compare 9.2 x 8.8 cm when remeasured in a similar fashion on the prior. Increased extension caudally into the greater omentum including on 27/2. Spleen: Normal in size, without focal abnormality. Adrenals/Urinary Tract: Normal adrenal glands. Normal kidneys, without hydronephrosis. Normal urinary bladder. Stomach/Bowel: The proximal stomach is underdistended but appears thick walled including on 12/02. Portions of the colon appear mildly thick walled, favored to be due to underdistention. Normal  terminal ileum. Normal small bowel. Vascular/Lymphatic: Normal caliber of the aorta and branch vessels. The splenic vein is attenuated centrally and favored to be chronically thrombosed in the region of the pancreatic tail and upstream body. Gastroepiploic collaterals again identified. Patent portal vein and SMV. No abdominopelvic adenopathy. Reproductive: Normal prostate. Other: Small volume cul-de-sac fluid, increased. Trace perihepatic ascites, new or increased. No free intraperitoneal air. Musculoskeletal: No acute osseous abnormality. IMPRESSION: 1. Evolution of complicated pancreatitis. The uncinate process lesion is more well-defined and decreased in size since the prior exam of 5 days ago, most consistent with developing pseudocyst. The pancreatic head smaller lesion is unchanged and likely also a tiny pseudocyst. The left upper quadrant peripancreatic ill-defined fluid collection is slightly enlarged. No evidence of pancreatic necrosis or other acute complication. 2. 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 3. Increase in small volume abdominopelvic fluid. 4. Probable secondary gastritis, accentuated by underdistention. 5. Trace left pleural fluid. 6.  Aortic Atherosclerosis (ICD10-I70.0). Electronically Signed   By: Abigail Miyamoto M.D.   On: 12/23/2022 13:42   Medications: I have reviewed the patient's current medications. Prior to Admission:  Medications Prior to Admission  Medication Sig Dispense Refill Last Dose   acetaminophen (TYLENOL) 500 MG tablet Take 500 mg by mouth every 6 (six) hours as needed for headache, moderate pain or mild pain.   prn at unk   Scheduled:  calcium carbonate  1 tablet Oral TID   [START ON 12/24/2022] lipase/protease/amylase  36,000 Units Oral TID WC   losartan  25 mg Oral Daily   nicotine  21 mg Transdermal Daily   pantoprazole (PROTONIX) IV  40 mg Intravenous Q12H   Continuous:  lactated ringers 125 mL/hr at  12/23/22 [id number]   [id number], albuterol, dextromethorphan-guaiFENesin, fentaNYL (SUBLIMAZE) injection, hydrALAZINE, ondansetron (ZOFRAN) IV, ondansetron, mouth rinse Anti-infectives (From admission, onward)    Start     Dose/Rate Route Frequency Ordered Stop   12/18/22 1500  oseltamivir (TAMIFLU) capsule 75 mg        75 mg Oral 2 times daily 12/18/22 1447 12/22/22 2213      Scheduled Meds:  calcium carbonate  1 tablet Oral TID   [START ON 12/24/2022] lipase/protease/amylase  36,000 Units Oral TID WC   losartan  25 mg Oral Daily   nicotine  21 mg Transdermal Daily   pantoprazole (PROTONIX) IV  40 mg Intravenous Q12H   Continuous Infusions:  lactated ringers 125 mL/hr at 12/23/22 0637   PRN Meds:.acetaminophen, albuterol, dextromethorphan-guaiFENesin, fentaNYL (SUBLIMAZE) injection, hydrALAZINE, ondansetron (ZOFRAN) IV, ondansetron, mouth rinse   Assessment: Principal Problem:   Acute pancreatitis Active  Problems:   Hyponatremia   Thrombocytopenia (HCC)   Hematemesis   Pancreatic mass   Influenza A   Hypertension  Mr. Shango Pippenger is a 37 year old male with history of alcohol induced chronic pancreatitis, admitted with acute on chronic pancreatitis  Plan: Acute on chronic pancreatitis: CT scan revealed evolution of complicated pancreatitis.  The lesion in the uncinate process is more well-defined and decreased in size consistent with developing pseudocyst.  The pancreatic head lesion is unchanged and most likely a pseudocyst.  The left upper quadrant peripancreatic ill-defined fluid collection is slightly enlarged.  There is no evidence of pancreatic necrosis. Recommend to start pancreatic enzyme replacement therapy which she will need long-term Creon 72 K with first bite of each meal and 1 with snack Check pancreatic fecal elastase levels If patient has worsening of loose stools, leads to diarrhea, recommend stool studies to rule out any infection Check serum IgG4  levels Diet as tolerated, recommend low carb, low-fat and high-protein diet, small frequent meals    LOS: 5 days   Gailene Youkhana 12/23/2022, 4:20 PM

## 2022-12-23 NOTE — Progress Notes (Signed)
PROGRESS NOTE    Shane Manderville Sr.  [id number] DOB: Jul 26, 1986 DOA: 12/18/2022 PCP: Pediatrics, [facility]   Brief Narrative: [name] is a 37 y.o. male with a history of alcohol abuse in remission, alcoholic pancreatitis with pseudocyst, hyponatremia, GERD, DVT not currently on anticoagulation. Patient presented secondary to abdominal pain and found to have evidence of acute pancreatitis. Bowel rest, IV fluids and analgesics provided. Incidentally, patient was found to have an influenza A infection and started on Tamiflu.  2/11: Blood pressure remained elevated, pain can be contributory.  Some improvement in nausea and vomiting.  CT scan with concern of enlarging pancreatic mass, apparently prior C 19-9 was little elevated and GI recommended repeating CT abdomen which was scheduled couple of months ago but patient never showed up.  Repeat CA 19-9 was ordered by GI. Patient need a pancreatic MRI to rule out any pancreatic malignancy once acute inflammation subsides.  Had another discussion with patient today. Per patient he has stopped drinking. Starting him on clear liquid diet and we will slowly progress as tolerated.  2/12: Blood pressure remained elevated at 161/97, starting him on losartan 25 mg daily. Tolerating clear liquid well, diet advanced to full liquid, he tolerated full liquid but developed pain and vomiting with soft diet.  Diet was downgraded again to full liquid. Continue to have lower abdominal pain and some diarrhea.  2/13: Vital stable, with some improvement in blood pressure.  Patient with worsening generalized abdominal pain and mild distention.  Repeat CT abdomen with contrast was obtained, which shows  1. Evolution of complicated pancreatitis. The uncinate process lesion is more well-defined and decreased in size since the prior exam of 5 days ago, most consistent with developing pseudocyst. The pancreatic head smaller lesion is unchanged and  likely also a tiny pseudocyst. The left upper quadrant peripancreatic ill-defined fluid collection is slightly enlarged. No evidence of pancreatic necrosis or other acute complication. 2. 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 3. Increase in small volume abdominopelvic fluid. 4. Probable secondary gastritis, accentuated by underdistention. 5. Trace left pleural fluid. 6.  Aortic Atherosclerosis (ICD10-I70.0). His repeat CA 19-9 came back normal. Another message sent to GI to revisit, he was originally seen by Dr. Allen Norris   Assessment and Plan:  Acute pancreatitis Abdominal pain with associated Lipase of 4,265 and CT imaging consistent with pancreatitis. Bowel rest and IV analgesic therapy initiated.  Lipase with significant improvement.  Repeat imaging with concern of developing pseudocyst, no necrosis noted. Worsening abdominal pain today. -Continue with full liquid diet as tolerated -Continue IV fluids-we will stop once started taking p.o. -Continue IV analgesics   Hematemesis Reported episodes prior to admission. Hemoglobin of 15.4 on admission with downward drift to 10.6 and is currently improving, at 12.3 today. GI consulted and recommended no inpatient evaluation. No recurrent episodes.  Hyponatremia. Resolved with IV fluid. Mild. Potassium of 128  on admission. Urine studies suggest likely dehydration as etiology. -Continue IV fluids  Hypokalemia Improved with repletion.  Hypocalcemia. Improving Likely related to pancreatitis -Continue calcium carbonate  Thrombocytopenia Mild. Chronic issue. Stable.  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 Recommendation for repeat MRI once acute inflammation has resolved to rule out malignancy. Discussed with patient at bedside. Had mildly elevated CA 19-9 few  months ago, repeat CT scan was arranged by GI but patient never showed  up. -Repeat CA 19 9 within normal limit. -Some improvement of that mass on repeat CT today-most likely developing pseudocyst.  Influenza A infection -Continue Tamiflu to complete a 5-day course  -Continue with supportive care  Elevated blood pressure No prior history of hypertension. Patient significant hypertensive this admission, but is asymptomatic. Low-dose losartan was added with improvement in blood pressure -Hydralazine PRN for symptomatic hypertension -Continue losartan-titrate as needed  DVT prophylaxis: SCDs Code Status:   Code Status: Full Code Family Communication: Discussed with patient Disposition Plan: Discharge home once abdominal pain improved and patient's diet advanced successfully. Anticipate discharge in 2-4 days   Consultants:  Gastroenterology  Procedures:  None  Antimicrobials: Tamiflu    Subjective: Patient continued to have worsening abdominal pain and nausea.  Objective: BP (!) 140/93 (BP Location: Right Arm)   Pulse 73   Temp 98.4 F (36.9 C) (Oral)   Resp 18   Ht 6' 1"$  (1.854 m)   Wt 65.8 kg   SpO2 97%   BMI 19.13 kg/m   Examination:  General.  Well-developed gentleman, in no acute distress. Pulmonary.  Lungs clear bilaterally, normal respiratory effort. CV.  Regular rate and rhythm, no JVD, rub or murmur. Abdomen.  Soft, diffuse tenderness with mildly distended, BS positive. CNS.  Alert and oriented .  No focal neurologic deficit. Extremities.  No edema, no cyanosis, pulses intact and symmetrical. Psychiatry.  Judgment and insight appears normal.    Data Reviewed: I have personally reviewed following labs and imaging studies  CBC Lab Results  Component Value Date   WBC 5.2 12/21/2022   RBC 3.99 (L) 12/21/2022   HGB 12.3 (L) 12/21/2022   HCT 34.3 (L) 12/21/2022   MCV 86.0 12/21/2022   MCH 30.8 12/21/2022   PLT 144 (L) 12/21/2022   MCHC 35.9 12/21/2022    RDW 12.3 12/21/2022   LYMPHSABS 1.5 07/02/2022   MONOABS 0.3 07/02/2022   EOSABS 0.3 07/02/2022   BASOSABS 0.0 99991111     Last metabolic panel Lab Results  Component Value Date   NA 136 12/22/2022   K 3.5 12/22/2022   CL 104 12/22/2022   CO2 24 12/22/2022   BUN <5 (L) 12/22/2022   CREATININE 0.67 12/22/2022   GLUCOSE 105 (H) 12/22/2022   GFRNONAA >60 12/22/2022   GFRAA >60 05/28/2020   CALCIUM 8.5 (L) 12/22/2022   PHOS 2.4 (L) 12/20/2022   PROT 5.7 (L) 12/22/2022   ALBUMIN 2.7 (L) 12/22/2022   BILITOT 0.7 12/22/2022   ALKPHOS 42 12/22/2022   AST 16 12/22/2022   ALT 11 12/22/2022   ANIONGAP 8 12/22/2022    GFR: Estimated Creatinine Clearance: 118.8 mL/min (by C-G formula based on SCr of 0.67 mg/dL).  Recent Results (from the past 240 hour(s))  Resp panel by RT-PCR (RSV, Flu A&B, Covid) Anterior Nasal Swab     Status: Abnormal   Collection Time: 12/18/22 11:11 AM   Specimen: Anterior Nasal Swab  Result Value Ref Range Status   SARS Coronavirus 2 by RT PCR NEGATIVE NEGATIVE Final    Comment: (NOTE) SARS-CoV-2 target nucleic acids are NOT DETECTED.  The SARS-CoV-2 RNA is generally detectable in upper respiratory specimens during the acute phase of infection. The lowest concentration of SARS-CoV-2 viral copies this assay can detect is 138 copies/mL. A negative result does not preclude SARS-Cov-2 infection and should not be used as the sole basis for treatment or other patient management decisions. A negative result may occur with  improper specimen collection/handling, submission of  specimen other than nasopharyngeal swab, presence of viral mutation(s) within the areas targeted by this assay, and inadequate number of viral copies(<138 copies/mL). A negative result must be combined with clinical observations, patient history, and epidemiological information. The expected result is Negative.  Fact Sheet for Patients:   EntrepreneurPulse.com.au  Fact Sheet for Healthcare Providers:  IncredibleEmployment.be  This test is no t yet approved or cleared by the [place] FDA and  has been authorized for detection and/or diagnosis of SARS-CoV-2 by FDA under an Emergency Use Authorization (EUA). This EUA will remain  in effect (meaning this test can be used) for the duration of the COVID-19 declaration under Section 564(b)(1) of the Act, 21 U.S.C.section 360bbb-3(b)(1), unless the authorization is terminated  or revoked sooner.       Influenza A by PCR POSITIVE (A) NEGATIVE Final   Influenza B by PCR NEGATIVE NEGATIVE Final    Comment: (NOTE) The Xpert Xpress SARS-CoV-2/FLU/RSV plus assay is intended as an aid in the diagnosis of influenza from Nasopharyngeal swab specimens and should not be used as a sole basis for treatment. Nasal washings and aspirates are unacceptable for Xpert Xpress SARS-CoV-2/FLU/RSV testing.  Fact Sheet for Patients: EntrepreneurPulse.com.au  Fact Sheet for Healthcare Providers: IncredibleEmployment.be  This test is not yet approved or cleared by the [place] FDA and has been authorized for detection and/or diagnosis of SARS-CoV-2 by FDA under an Emergency Use Authorization (EUA). This EUA will remain in effect (meaning this test can be used) for the duration of the COVID-19 declaration under Section 564(b)(1) of the Act, 21 U.S.C. section 360bbb-3(b)(1), unless the authorization is terminated or revoked.     Resp Syncytial Virus by PCR NEGATIVE NEGATIVE Final    Comment: (NOTE) Fact Sheet for Patients: EntrepreneurPulse.com.au  Fact Sheet for Healthcare Providers: IncredibleEmployment.be  This test is not yet approved or cleared by the [place] FDA and has been authorized for detection and/or diagnosis of SARS-CoV-2 by FDA under an Emergency Use  Authorization (EUA). This EUA will remain in effect (meaning this test can be used) for the duration of the COVID-19 declaration under Section 564(b)(1) of the Act, 21 U.S.C. section 360bbb-3(b)(1), unless the authorization is terminated or revoked.  Performed at [facility], [address] [place], [place] [address]       Radiology Studies: CT ABDOMEN PELVIS W CONTRAST  Result Date: 12/23/2022 CLINICAL DATA:  Alcohol abuse, in remission. Alcoholic pancreatitis with pseudocyst. EXAM: CT ABDOMEN AND PELVIS WITH CONTRAST TECHNIQUE: Multidetector CT imaging of the abdomen and pelvis was performed using the standard protocol following bolus administration of intravenous contrast. RADIATION DOSE REDUCTION: This exam was performed according to the departmental dose-optimization program which includes automated exposure control, adjustment of the mA and/or kV according to patient size and/or use of iterative reconstruction technique. CONTRAST:  87m OMNIPAQUE IOHEXOL 300 MG/ML  SOLN COMPARISON:  12/18/2022.  04/11/2022. FINDINGS: Lower chest: Dependent left base subsegmental atelectasis. Normal heart size with trace left pleural fluid. Hepatobiliary: Normal liver. Normal gallbladder, without biliary ductal dilatation. Pancreas: Again identified is borderline to mild pancreatic duct dilatation within the body, tail, and neck. Followed to the level of the pancreatic head where it becomes more normal in caliber. Pancreatic uncinate process cystic lesion measures 2.9 x 2.9 cm on 30/2 versus 3.9 x 3.2 cm on the prior exam. More well encapsulated today with suggestion of peripheral enhancement. Probable debris within its dependent portion on 31/2. Anterior pancreatic head cystic lesion of 9 mm on 30/2  is similar to 10 mm on the prior. Peripancreatic edema is most apparent adjacent the head and uncinate process, felt to be slightly decreased. No pancreatic necrosis. Relatively ill-defined fluid within  the gastrosplenic ligament measures 11.5 x 7.9 cm on 17/2. Compare 9.2 x 8.8 cm when remeasured in a similar fashion on the prior. Increased extension caudally into the greater omentum including on 27/2. Spleen: Normal in size, without focal abnormality. Adrenals/Urinary Tract: Normal adrenal glands. Normal kidneys, without hydronephrosis. Normal urinary bladder. Stomach/Bowel: The proximal stomach is underdistended but appears thick walled including on 12/02. Portions of the colon appear mildly thick walled, favored to be due to underdistention. Normal terminal ileum. Normal small bowel. Vascular/Lymphatic: Normal caliber of the aorta and branch vessels. The splenic vein is attenuated centrally and favored to be chronically thrombosed in the region of the pancreatic tail and upstream body. Gastroepiploic collaterals again identified. Patent portal vein and SMV. No abdominopelvic adenopathy. Reproductive: Normal prostate. Other: Small volume cul-de-sac fluid, increased. Trace perihepatic ascites, new or increased. No free intraperitoneal air. Musculoskeletal: No acute osseous abnormality. IMPRESSION: 1. Evolution of complicated pancreatitis. The uncinate process lesion is more well-defined and decreased in size since the prior exam of 5 days ago, most consistent with developing pseudocyst. The pancreatic head smaller lesion is unchanged and likely also a tiny pseudocyst. The left upper quadrant peripancreatic ill-defined fluid collection is slightly enlarged. No evidence of pancreatic necrosis or other acute complication. 2. 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 3. Increase in small volume abdominopelvic fluid. 4. Probable secondary gastritis, accentuated by underdistention. 5. Trace left pleural fluid. 6.  Aortic Atherosclerosis (ICD10-I70.0). Electronically Signed   By: Abigail Miyamoto M.D.   On: 12/23/2022 13:42      LOS: 5 days   This record has been  created using Systems analyst. Errors have been sought and corrected,but may not always be located. Such creation errors do not reflect on the standard of care.   Lorella Nimrod, MD [facility] 12/23/2022, 2:48 PM   If 7PM-7AM, please contact night-coverage [email or web address]

## 2022-12-24 DIAGNOSIS — K863 Pseudocyst of pancreas: Secondary | ICD-10-CM | POA: Diagnosis not present

## 2022-12-24 DIAGNOSIS — K861 Other chronic pancreatitis: Secondary | ICD-10-CM | POA: Diagnosis not present

## 2022-12-24 DIAGNOSIS — K859 Acute pancreatitis without necrosis or infection, unspecified: Secondary | ICD-10-CM | POA: Diagnosis not present

## 2022-12-24 LAB — COMPREHENSIVE METABOLIC PANEL
ALT: 8 U/L (ref 0–44)
AST: 12 U/L — ABNORMAL LOW (ref 15–41)
Albumin: 2.5 g/dL — ABNORMAL LOW (ref 3.5–5.0)
Alkaline Phosphatase: 38 U/L (ref 38–126)
Anion gap: 7 (ref 5–15)
BUN: <5 mg/dL — ABNORMAL LOW (ref 6–20)
CO2: 26 mmol/L (ref 22–32)
Calcium: 8.4 mg/dL — ABNORMAL LOW (ref 8.9–10.3)
Chloride: 102 mmol/L (ref 98–111)
Creatinine, Ser: 0.7 mg/dL (ref 0.61–1.24)
GFR, Estimated: >60 mL/min (ref >60)
Glucose, Bld: 95 mg/dL (ref 70–99)
Potassium: 3.6 mmol/L (ref 3.5–5.1)
Sodium: 135 mmol/L (ref 135–145)
Total Bilirubin: 0.7 mg/dL (ref 0.3–1.2)
Total Protein: 5.2 g/dL — ABNORMAL LOW (ref 6.5–8.1)

## 2022-12-24 LAB — GLUCOSE, CAPILLARY: Glucose-Capillary: 91 mg/dL (ref 70–99)

## 2022-12-24 LAB — CBC
HCT: 33.3 % — ABNORMAL LOW (ref 39.0–52.0)
Hemoglobin: 11.2 g/dL — ABNORMAL LOW (ref 13.0–17.0)
MCH: 29.5 pg (ref 26.0–34.0)
MCHC: 33.6 g/dL (ref 30.0–36.0)
MCV: 87.6 fL (ref 80.0–100.0)
Platelets: 219 10*3/uL (ref 150–400)
RBC: 3.8 MIL/uL — ABNORMAL LOW (ref 4.22–5.81)
RDW: 12.5 % (ref 11.5–15.5)
WBC: 5 10*3/uL (ref 4.0–10.5)
nRBC: 0 % (ref 0.0–0.2)

## 2022-12-24 MED ORDER — TRAZODONE HCL 50 MG PO TABS
50.0000 mg | ORAL_TABLET | Freq: Every evening | ORAL | Status: DC | PRN
  Administered 2022-12-24 – 2022-12-26 (×2): 50 mg via ORAL
  Filled 2022-12-24 (×2): qty 1

## 2022-12-24 NOTE — Progress Notes (Signed)
PROGRESS NOTE    Shane Jenkins.  [id number] DOB: 05/12/1986 DOA: 12/18/2022 PCP: Pediatrics, [facility]   Brief Narrative: Shane Bailiff Jenkins. is a 37 y.o. male with a history of alcohol abuse in remission, alcoholic pancreatitis with pseudocyst, hyponatremia, GERD, DVT not currently on anticoagulation. Patient presented secondary to abdominal pain and found to have evidence of acute pancreatitis. Bowel rest, IV fluids and analgesics provided. Incidentally, patient was found to have an influenza A infection and started on Tamiflu.  2/11: Blood pressure remained elevated, pain can be contributory.  Some improvement in nausea and vomiting.  CT scan with concern of enlarging pancreatic mass, apparently prior C 19-9 was little elevated and GI recommended repeating CT abdomen which was scheduled couple of months ago but patient never showed up.  Repeat CA 19-9 was ordered by GI. Patient need a pancreatic MRI to rule out any pancreatic malignancy once acute inflammation subsides.  Had another discussion with patient today. Per patient he has stopped drinking. Starting him on clear liquid diet and we will slowly progress as tolerated.  2/12: Blood pressure remained elevated at 161/97, starting him on losartan 25 mg daily. Tolerating clear liquid well, diet advanced to full liquid, he tolerated full liquid but developed pain and vomiting with soft diet.  Diet was downgraded again to full liquid. Continue to have lower abdominal pain and some diarrhea.  2/13: Vital stable, with some improvement in blood pressure.  Patient with worsening generalized abdominal pain and mild distention.  Repeat CT abdomen with contrast was obtained, which shows  1. Evolution of complicated pancreatitis. The uncinate process lesion is more well-defined and decreased in size since the prior exam of 5 days ago, most consistent with developing pseudocyst. The pancreatic head smaller lesion is unchanged and  likely also a tiny pseudocyst. The left upper quadrant peripancreatic ill-defined fluid collection is slightly enlarged. No evidence of pancreatic necrosis or other acute complication. 2. 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 3. Increase in small volume abdominopelvic fluid. 4. Probable secondary gastritis, accentuated by underdistention. 5. Trace left pleural fluid. 6.  Aortic Atherosclerosis (ICD10-I70.0). His repeat CA 19-9 came back normal. Another message sent to GI to revisit, he was originally seen by Dr. Allen Norris.  2/14: Hemodynamically stable.  GI started him on Creon.  Tolerating full liquid diet, he was advanced to soft again and developed worsening abdominal pain and nausea. Will monitor for another day, if remains stable can go home tomorrow with gradually advancing diet.   Assessment and Plan:  Acute pancreatitis Abdominal pain with associated Lipase of 4,265 and CT imaging consistent with pancreatitis. Bowel rest and IV analgesic therapy initiated.  Lipase with significant improvement.  Repeat imaging with concern of developing pseudocyst, no necrosis noted. GI started him on Creon which he will need long-term -Continue with full liquid diet and advance as tolerated, again developed worsening abdominal pain after taking soft diet with lunch. -Continue IV analgesics   Hematemesis Reported episodes prior to admission. Hemoglobin of 15.4 on admission with downward drift to 10.6 and is currently improving, at 12.3 today. GI consulted and recommended no inpatient evaluation. No recurrent episodes.  Hyponatremia. Resolved with IV fluid. Mild. Potassium of 128  on admission. Urine studies suggest likely dehydration as etiology. -Continue IV fluids  Hypokalemia Improved with repletion.  Hypocalcemia. Improving Likely related to pancreatitis -Continue calcium carbonate  Thrombocytopenia Mild. Chronic issue.  Stable.  Pancreatic mass Located within the uncinate process, measuring  3.9 x 3.2 cm, increased from 2.8 x 2.6 cm. 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 Recommendation for repeat MRI once acute inflammation has resolved to rule out malignancy. Discussed with patient at bedside. Had mildly elevated CA 19-9 few months ago, repeat CT scan was arranged by GI but patient never showed up. -Repeat CA 19 9 within normal limit. -Some improvement of that mass on repeat CT today-most likely developing pseudocyst. -GI follow-up on discharge  Influenza A infection -Completed a course of Tamiflu -Continue with supportive care  Elevated blood pressure No prior history of hypertension. Patient significant hypertensive this admission, but is asymptomatic. Low-dose losartan was added with improvement in blood pressure -Hydralazine PRN for symptomatic hypertension -Continue losartan-titrate as needed  DVT prophylaxis: SCDs Code Status:   Code Status: Full Code Family Communication: Discussed with patient Disposition Plan: Discharge home once abdominal pain improved and patient's diet advanced successfully. Anticipate discharge in 1-2 days   Consultants:  Gastroenterology  Procedures:  None  Antimicrobials: Tamiflu    Subjective: Patient was feeling little better during morning rounds and was tolerating full liquid diet well with Creon.  Agrees to try soft with lunch  Objective: BP 132/86 (BP Location: Right Arm)   Pulse 76   Temp 97.6 F (36.4 C) (Oral)   Resp 18   Ht 6' 1"$  (1.854 m)   Wt 65.8 kg   SpO2 100%   BMI 19.13 kg/m   Examination:  General.  Well-developed gentleman, in no acute distress. Pulmonary.  Lungs clear bilaterally, normal respiratory effort. CV.  Regular rate and rhythm, no JVD, rub or murmur. Abdomen.  Soft, nontender, nondistended, BS positive. CNS.  Alert and oriented .  No focal neurologic deficit. Extremities.  No edema, no cyanosis,  pulses intact and symmetrical. Psychiatry.  Judgment and insight appears normal.   Data Reviewed: I have personally reviewed following labs and imaging studies  CBC Lab Results  Component Value Date   WBC 5.0 12/24/2022   RBC 3.80 (L) 12/24/2022   HGB 11.2 (L) 12/24/2022   HCT 33.3 (L) 12/24/2022   MCV 87.6 12/24/2022   MCH 29.5 12/24/2022   PLT 219 12/24/2022   MCHC 33.6 12/24/2022   RDW 12.5 12/24/2022   LYMPHSABS 1.5 07/02/2022   MONOABS 0.3 07/02/2022   EOSABS 0.3 07/02/2022   BASOSABS 0.0 99991111     Last metabolic panel Lab Results  Component Value Date   NA 135 12/24/2022   K 3.6 12/24/2022   CL 102 12/24/2022   CO2 26 12/24/2022   BUN <5 (L) 12/24/2022   CREATININE 0.70 12/24/2022   GLUCOSE 95 12/24/2022   GFRNONAA >60 12/24/2022   GFRAA >60 05/28/2020   CALCIUM 8.4 (L) 12/24/2022   PHOS 2.4 (L) 12/20/2022   PROT 5.2 (L) 12/24/2022   ALBUMIN 2.5 (L) 12/24/2022   BILITOT 0.7 12/24/2022   ALKPHOS 38 12/24/2022   AST 12 (L) 12/24/2022   ALT 8 12/24/2022   ANIONGAP 7 12/24/2022    GFR: Estimated Creatinine Clearance: 118.8 mL/min (by C-G formula based on SCr of 0.7 mg/dL).  Recent Results (from the past 240 hour(s))  Resp panel by RT-PCR (RSV, Flu A&B, Covid) Anterior Nasal Swab     Status: Abnormal   Collection Time: 12/18/22 11:11 AM   Specimen: Anterior Nasal Swab  Result Value Ref Range Status   SARS Coronavirus 2 by RT PCR NEGATIVE NEGATIVE Final    Comment: (NOTE) SARS-CoV-2 target nucleic acids are NOT DETECTED.  The SARS-CoV-2  RNA is generally detectable in upper respiratory specimens during the acute phase of infection. The lowest concentration of SARS-CoV-2 viral copies this assay can detect is 138 copies/mL. A negative result does not preclude SARS-Cov-2 infection and should not be used as the sole basis for treatment or other patient management decisions. A negative result may occur with  improper specimen collection/handling,  submission of specimen other than nasopharyngeal swab, presence of viral mutation(s) within the areas targeted by this assay, and inadequate number of viral copies(<138 copies/mL). A negative result must be combined with clinical observations, patient history, and epidemiological information. The expected result is Negative.  Fact Sheet for Patients:  EntrepreneurPulse.com.au  Fact Sheet for Healthcare Providers:  IncredibleEmployment.be  This test is no t yet approved or cleared by the [place] FDA and  has been authorized for detection and/or diagnosis of SARS-CoV-2 by FDA under an Emergency Use Authorization (EUA). This EUA will remain  in effect (meaning this test can be used) for the duration of the COVID-19 declaration under Section 564(b)(1) of the Act, 21 U.S.C.section 360bbb-3(b)(1), unless the authorization is terminated  or revoked sooner.       Influenza A by PCR POSITIVE (A) NEGATIVE Final   Influenza B by PCR NEGATIVE NEGATIVE Final    Comment: (NOTE) The Xpert Xpress SARS-CoV-2/FLU/RSV plus assay is intended as an aid in the diagnosis of influenza from Nasopharyngeal swab specimens and should not be used as a sole basis for treatment. Nasal washings and aspirates are unacceptable for Xpert Xpress SARS-CoV-2/FLU/RSV testing.  Fact Sheet for Patients: EntrepreneurPulse.com.au  Fact Sheet for Healthcare Providers: IncredibleEmployment.be  This test is not yet approved or cleared by the [place] FDA and has been authorized for detection and/or diagnosis of SARS-CoV-2 by FDA under an Emergency Use Authorization (EUA). This EUA will remain in effect (meaning this test can be used) for the duration of the COVID-19 declaration under Section 564(b)(1) of the Act, 21 U.S.C. section 360bbb-3(b)(1), unless the authorization is terminated or revoked.     Resp Syncytial Virus by PCR  NEGATIVE NEGATIVE Final    Comment: (NOTE) Fact Sheet for Patients: EntrepreneurPulse.com.au  Fact Sheet for Healthcare Providers: IncredibleEmployment.be  This test is not yet approved or cleared by the [place] FDA and has been authorized for detection and/or diagnosis of SARS-CoV-2 by FDA under an Emergency Use Authorization (EUA). This EUA will remain in effect (meaning this test can be used) for the duration of the COVID-19 declaration under Section 564(b)(1) of the Act, 21 U.S.C. section 360bbb-3(b)(1), unless the authorization is terminated or revoked.  Performed at [facility], [address] [place], [place] [address]       Radiology Studies: CT ABDOMEN PELVIS W CONTRAST  Result Date: 12/23/2022 CLINICAL DATA:  Alcohol abuse, in remission. Alcoholic pancreatitis with pseudocyst. EXAM: CT ABDOMEN AND PELVIS WITH CONTRAST TECHNIQUE: Multidetector CT imaging of the abdomen and pelvis was performed using the standard protocol following bolus administration of intravenous contrast. RADIATION DOSE REDUCTION: This exam was performed according to the departmental dose-optimization program which includes automated exposure control, adjustment of the mA and/or kV according to patient size and/or use of iterative reconstruction technique. CONTRAST:  32m OMNIPAQUE IOHEXOL 300 MG/ML  SOLN COMPARISON:  12/18/2022.  04/11/2022. FINDINGS: Lower chest: Dependent left base subsegmental atelectasis. Normal heart size with trace left pleural fluid. Hepatobiliary: Normal liver. Normal gallbladder, without biliary ductal dilatation. Pancreas: Again identified is borderline to mild pancreatic duct dilatation within the body, tail, and  neck. Followed to the level of the pancreatic head where it becomes more normal in caliber. Pancreatic uncinate process cystic lesion measures 2.9 x 2.9 cm on 30/2 versus 3.9 x 3.2 cm on the prior exam. More well  encapsulated today with suggestion of peripheral enhancement. Probable debris within its dependent portion on 31/2. Anterior pancreatic head cystic lesion of 9 mm on 30/2 is similar to 10 mm on the prior. Peripancreatic edema is most apparent adjacent the head and uncinate process, felt to be slightly decreased. No pancreatic necrosis. Relatively ill-defined fluid within the gastrosplenic ligament measures 11.5 x 7.9 cm on 17/2. Compare 9.2 x 8.8 cm when remeasured in a similar fashion on the prior. Increased extension caudally into the greater omentum including on 27/2. Spleen: Normal in size, without focal abnormality. Adrenals/Urinary Tract: Normal adrenal glands. Normal kidneys, without hydronephrosis. Normal urinary bladder. Stomach/Bowel: The proximal stomach is underdistended but appears thick walled including on 12/02. Portions of the colon appear mildly thick walled, favored to be due to underdistention. Normal terminal ileum. Normal small bowel. Vascular/Lymphatic: Normal caliber of the aorta and branch vessels. The splenic vein is attenuated centrally and favored to be chronically thrombosed in the region of the pancreatic tail and upstream body. Gastroepiploic collaterals again identified. Patent portal vein and SMV. No abdominopelvic adenopathy. Reproductive: Normal prostate. Other: Small volume cul-de-sac fluid, increased. Trace perihepatic ascites, new or increased. No free intraperitoneal air. Musculoskeletal: No acute osseous abnormality. IMPRESSION: 1. Evolution of complicated pancreatitis. The uncinate process lesion is more well-defined and decreased in size since the prior exam of 5 days ago, most consistent with developing pseudocyst. The pancreatic head smaller lesion is unchanged and likely also a tiny pseudocyst. The left upper quadrant peripancreatic ill-defined fluid collection is slightly enlarged. No evidence of pancreatic necrosis or other acute complication. 2. Chronic splenic vein  insufficiency and probable thrombosis with gastroepiploic collaterals. 3. Increase in small volume abdominopelvic fluid. 4. Probable secondary gastritis, accentuated by underdistention. 5. Trace left pleural fluid. 6.  Aortic Atherosclerosis (ICD10-I70.0). Electronically Signed   By: Abigail Miyamoto M.D.   On: 12/23/2022 13:42      LOS: 6 days   This record has been created using Systems analyst. Errors have been sought and corrected,but may not always be located. Such creation errors do not reflect on the standard of care.   Lorella Nimrod, MD [facility] 12/24/2022, 5:20 PM   If 7PM-7AM, please contact night-coverage [email or web address]

## 2022-12-24 NOTE — Progress Notes (Addendum)
       CROSS COVER NOTE  NAME: Shane Pullman Sr. MRN: [id number] DOB : 10/26/1986    HPI/Events of Note   Nurse reports patient with difficulty sleeping and now improvement with melatonin night prior  Assessment and  Interventions   Assessment: Allergies, vitals, labs reviewed Plan: Trazodone 50 mg q HS prn ordered Bmp, Mag and phos added to am labs       Kathlene Cote NP [facility]

## 2022-12-24 NOTE — Progress Notes (Signed)
Cephas Darby, MD [address]  [place]  [place], [place] [address]  Main: [phone]  Fax: [phone] Pager: [phone]   Subjective: Started taking Creon, reports having some abdominal pain which is better, feels less bloated and did not have diarrhea.  Tolerating full liquids, transitioned to soft diet today   Objective: Vital signs in last 24 hours: Vitals:   12/23/22 1544 12/23/22 2040 12/24/22 0408 12/24/22 0753  BP: (!) 152/98 (!) 152/98 (!) 134/91 132/86  Pulse: 76 81 74 76  Resp: 18 (!) 24 18 18  $ Temp: 98.4 F (36.9 C) 97.9 F (36.6 C) 97.7 F (36.5 C) 97.6 F (36.4 C)  TempSrc:  Oral Oral Oral  SpO2: 98% 98% 98% 100%  Weight:      Height:       Weight change:   Intake/Output Summary (Last 24 hours) at 12/24/2022 1604 Last data filed at 12/24/2022 1206 Gross per 24 hour  Intake 6451.82 ml  Output --  Net 6451.82 ml    Exam: Heart:: Regular rate and rhythm, S1S2 present, or without murmur or extra heart sounds Lungs: normal and clear to auscultation Abdomen: Soft, mild tenderness, mildly distended   Lab Results:    Latest Ref Rng & Units 12/24/2022    6:01 AM 12/21/2022    2:17 AM 12/20/2022    8:39 AM  CBC  WBC 4.0 - 10.5 K/uL 5.0  5.2  5.3   Hemoglobin 13.0 - 17.0 g/dL 11.2  12.3  11.4   Hematocrit 39.0 - 52.0 % 33.3  34.3  33.0   Platelets 150 - 400 K/uL 219  144  102       Latest Ref Rng & Units 12/24/2022    6:01 AM 12/22/2022    8:32 AM 12/21/2022    2:17 AM  CMP  Glucose 70 - 99 mg/dL 95  105  83   BUN 6 - 20 mg/dL <5  <5  <5   Creatinine 0.61 - 1.24 mg/dL 0.70  0.67  0.68   Sodium 135 - 145 mmol/L 135  136  130   Potassium 3.5 - 5.1 mmol/L 3.6  3.5  3.6   Chloride 98 - 111 mmol/L 102  104  99   CO2 22 - 32 mmol/L 26  24  20   $ Calcium 8.9 - 10.3 mg/dL 8.4  8.5  8.3   Total Protein 6.5 - 8.1 g/dL 5.2  5.7  6.1   Total Bilirubin 0.3 - 1.2 mg/dL 0.7  0.7  1.3   Alkaline Phos 38 - 126 U/L 38  42  44   AST 15 - 41 U/L 12   16  19   $ ALT 0 - 44 U/L 8  11  11     $ Micro Results: Recent Results (from the past 240 hour(s))  Resp panel by RT-PCR (RSV, Flu A&B, Covid) Anterior Nasal Swab     Status: Abnormal   Collection Time: 12/18/22 11:11 AM   Specimen: Anterior Nasal Swab  Result Value Ref Range Status   SARS Coronavirus 2 by RT PCR NEGATIVE NEGATIVE Final    Comment: (NOTE) SARS-CoV-2 target nucleic acids are NOT DETECTED.  The SARS-CoV-2 RNA is generally detectable in upper respiratory specimens during the acute phase of infection. The lowest concentration of SARS-CoV-2 viral copies this assay can detect is 138 copies/mL. A negative result does not preclude SARS-Cov-2 infection and should not be used as the sole basis for treatment or other patient  management decisions. A negative result may occur with  improper specimen collection/handling, submission of specimen other than nasopharyngeal swab, presence of viral mutation(s) within the areas targeted by this assay, and inadequate number of viral copies(<138 copies/mL). A negative result must be combined with clinical observations, patient history, and epidemiological information. The expected result is Negative.  Fact Sheet for Patients:  EntrepreneurPulse.com.au  Fact Sheet for Healthcare Providers:  IncredibleEmployment.be  This test is no t yet approved or cleared by the [place] FDA and  has been authorized for detection and/or diagnosis of SARS-CoV-2 by FDA under an Emergency Use Authorization (EUA). This EUA will remain  in effect (meaning this test can be used) for the duration of the COVID-19 declaration under Section 564(b)(1) of the Act, 21 U.S.C.section 360bbb-3(b)(1), unless the authorization is terminated  or revoked sooner.       Influenza A by PCR POSITIVE (A) NEGATIVE Final   Influenza B by PCR NEGATIVE NEGATIVE Final    Comment: (NOTE) The Xpert Xpress SARS-CoV-2/FLU/RSV plus assay is  intended as an aid in the diagnosis of influenza from Nasopharyngeal swab specimens and should not be used as a sole basis for treatment. Nasal washings and aspirates are unacceptable for Xpert Xpress SARS-CoV-2/FLU/RSV testing.  Fact Sheet for Patients: EntrepreneurPulse.com.au  Fact Sheet for Healthcare Providers: IncredibleEmployment.be  This test is not yet approved or cleared by the [place] FDA and has been authorized for detection and/or diagnosis of SARS-CoV-2 by FDA under an Emergency Use Authorization (EUA). This EUA will remain in effect (meaning this test can be used) for the duration of the COVID-19 declaration under Section 564(b)(1) of the Act, 21 U.S.C. section 360bbb-3(b)(1), unless the authorization is terminated or revoked.     Resp Syncytial Virus by PCR NEGATIVE NEGATIVE Final    Comment: (NOTE) Fact Sheet for Patients: EntrepreneurPulse.com.au  Fact Sheet for Healthcare Providers: IncredibleEmployment.be  This test is not yet approved or cleared by the [place] FDA and has been authorized for detection and/or diagnosis of SARS-CoV-2 by FDA under an Emergency Use Authorization (EUA). This EUA will remain in effect (meaning this test can be used) for the duration of the COVID-19 declaration under Section 564(b)(1) of the Act, 21 U.S.C. section 360bbb-3(b)(1), unless the authorization is terminated or revoked.  Performed at [facility], [address] [place], [place] [address]    Studies/Results: CT ABDOMEN PELVIS W CONTRAST  Result Date: 12/23/2022 CLINICAL DATA:  Alcohol abuse, in remission. Alcoholic pancreatitis with pseudocyst. EXAM: CT ABDOMEN AND PELVIS WITH CONTRAST TECHNIQUE: Multidetector CT imaging of the abdomen and pelvis was performed using the standard protocol following bolus administration of intravenous contrast. RADIATION DOSE REDUCTION: This  exam was performed according to the departmental dose-optimization program which includes automated exposure control, adjustment of the mA and/or kV according to patient size and/or use of iterative reconstruction technique. CONTRAST:  32m OMNIPAQUE IOHEXOL 300 MG/ML  SOLN COMPARISON:  12/18/2022.  04/11/2022. FINDINGS: Lower chest: Dependent left base subsegmental atelectasis. Normal heart size with trace left pleural fluid. Hepatobiliary: Normal liver. Normal gallbladder, without biliary ductal dilatation. Pancreas: Again identified is borderline to mild pancreatic duct dilatation within the body, tail, and neck. Followed to the level of the pancreatic head where it becomes more normal in caliber. Pancreatic uncinate process cystic lesion measures 2.9 x 2.9 cm on 30/2 versus 3.9 x 3.2 cm on the prior exam. More well encapsulated today with suggestion of peripheral enhancement. Probable debris within its dependent portion on 31/2.  Anterior pancreatic head cystic lesion of 9 mm on 30/2 is similar to 10 mm on the prior. Peripancreatic edema is most apparent adjacent the head and uncinate process, felt to be slightly decreased. No pancreatic necrosis. Relatively ill-defined fluid within the gastrosplenic ligament measures 11.5 x 7.9 cm on 17/2. Compare 9.2 x 8.8 cm when remeasured in a similar fashion on the prior. Increased extension caudally into the greater omentum including on 27/2. Spleen: Normal in size, without focal abnormality. Adrenals/Urinary Tract: Normal adrenal glands. Normal kidneys, without hydronephrosis. Normal urinary bladder. Stomach/Bowel: The proximal stomach is underdistended but appears thick walled including on 12/02. Portions of the colon appear mildly thick walled, favored to be due to underdistention. Normal terminal ileum. Normal small bowel. Vascular/Lymphatic: Normal caliber of the aorta and branch vessels. The splenic vein is attenuated centrally and favored to be chronically  thrombosed in the region of the pancreatic tail and upstream body. Gastroepiploic collaterals again identified. Patent portal vein and SMV. No abdominopelvic adenopathy. Reproductive: Normal prostate. Other: Small volume cul-de-sac fluid, increased. Trace perihepatic ascites, new or increased. No free intraperitoneal air. Musculoskeletal: No acute osseous abnormality. IMPRESSION: 1. Evolution of complicated pancreatitis. The uncinate process lesion is more well-defined and decreased in size since the prior exam of 5 days ago, most consistent with developing pseudocyst. The pancreatic head smaller lesion is unchanged and likely also a tiny pseudocyst. The left upper quadrant peripancreatic ill-defined fluid collection is slightly enlarged. No evidence of pancreatic necrosis or other acute complication. 2. 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 3. Increase in small volume abdominopelvic fluid. 4. Probable secondary gastritis, accentuated by underdistention. 5. Trace left pleural fluid. 6.  Aortic Atherosclerosis (ICD10-I70.0). Electronically Signed   By: Abigail Miyamoto M.D.   On: 12/23/2022 13:42   Medications: I have reviewed the patient's current medications. Prior to Admission:  Medications Prior to Admission  Medication Sig Dispense Refill Last Dose   acetaminophen (TYLENOL) 500 MG tablet Take 500 mg by mouth every 6 (six) hours as needed for headache, moderate pain or mild pain.   prn at unk   Scheduled:  calcium carbonate  1 tablet Oral TID   lipase/protease/amylase  72,000 Units Oral TID WC   losartan  25 mg Oral Daily   morphine  15 mg Oral Q12H   nicotine  21 mg Transdermal Daily   pantoprazole (PROTONIX) IV  40 mg Intravenous Q12H   Continuous:  lactated ringers 75 mL/hr at 12/24/22 1206   [id number], albuterol, dextromethorphan-guaiFENesin, fentaNYL (SUBLIMAZE) injection, hydrALAZINE, ondansetron (ZOFRAN) IV, mouth  rinse Anti-infectives (From admission, onward)    Start     Dose/Rate Route Frequency Ordered Stop   12/18/22 1500  oseltamivir (TAMIFLU) capsule 75 mg        75 mg Oral 2 times daily 12/18/22 1447 12/22/22 2213      Scheduled Meds:  calcium carbonate  1 tablet Oral TID   lipase/protease/amylase  72,000 Units Oral TID WC   losartan  25 mg Oral Daily   morphine  15 mg Oral Q12H   nicotine  21 mg Transdermal Daily   pantoprazole (PROTONIX) IV  40 mg Intravenous Q12H   Continuous Infusions:  lactated ringers 75 mL/hr at 12/24/22 1206   PRN Meds:.acetaminophen, albuterol, dextromethorphan-guaiFENesin, fentaNYL (SUBLIMAZE) injection, hydrALAZINE, ondansetron (ZOFRAN) IV, mouth rinse   Assessment: Principal Problem:   Acute pancreatitis Active Problems:   Hyponatremia   Thrombocytopenia (HCC)   Hematemesis   Pancreatic mass   Influenza A  Hypertension   Pancreatic pseudocyst   Acute on chronic pancreatitis [facility])  Mr. Zackrey Schlicher is a 37 year old male with history of alcohol induced chronic pancreatitis, admitted with acute on chronic pancreatitis   Plan: Acute on chronic pancreatitis: CT scan revealed evolution of complicated pancreatitis.  The lesion in the uncinate process is more well-defined and decreased in size consistent with developing pseudocyst.  The pancreatic head lesion is unchanged and most likely a pseudocyst.  The left upper quadrant peripancreatic ill-defined fluid collection is slightly enlarged.  There is no evidence of pancreatic necrosis. Recommend to start pancreatic enzyme replacement therapy which he will need long-term Creon 72 K with first bite of each meal and 1 with snack, continue as outpatient Check pancreatic fecal elastase levels Check serum IgG4 levels CA 19-9 levels are normal, low concern for pancreatic neoplasm Diet as tolerated, recommend low carb, low-fat and high-protein diet, small frequent meals Recommend follow-up with GI as  outpatient  GI will sign off at this time, please call us back with questions or concerns   LOS: 6 days   Rhylie Stehr 12/24/2022, 4:04 PM

## 2022-12-25 DIAGNOSIS — D696 Thrombocytopenia, unspecified: Secondary | ICD-10-CM | POA: Diagnosis not present

## 2022-12-25 DIAGNOSIS — E871 Hypo-osmolality and hyponatremia: Secondary | ICD-10-CM | POA: Diagnosis not present

## 2022-12-25 DIAGNOSIS — K859 Acute pancreatitis without necrosis or infection, unspecified: Secondary | ICD-10-CM | POA: Diagnosis not present

## 2022-12-25 DIAGNOSIS — K920 Hematemesis: Secondary | ICD-10-CM | POA: Diagnosis not present

## 2022-12-25 LAB — PHOSPHORUS: Phosphorus: 5.1 mg/dL — ABNORMAL HIGH (ref 2.5–4.6)

## 2022-12-25 LAB — IGG 4: IgG, Subclass 4: 18 mg/dL (ref 2–96)

## 2022-12-25 LAB — MAGNESIUM: Magnesium: 1.7 mg/dL (ref 1.7–2.4)

## 2022-12-25 LAB — GLUCOSE, CAPILLARY: Glucose-Capillary: 88 mg/dL (ref 70–99)

## 2022-12-25 NOTE — Progress Notes (Signed)
PROGRESS NOTE    Shane Popwell Sr.  [id number] DOB: [birth year] DOA: 12/18/2022 PCP: Pediatrics, [facility]   Brief Narrative: Shane Bailiff Sr. is a 37 y.o. male with a history of alcohol abuse in remission, alcoholic pancreatitis with pseudocyst, hyponatremia, GERD, DVT not currently on anticoagulation. Patient presented to the emergency room on 2/8 with midepigastric abdominal pain and found to have acute pancreatitis.  Also found to have influenza A.    By 2/11, patient started to feel better.  Diet started on clear liquids and initial attempt to advance diet very slowly due to patient recurrent pain and nausea.  Gastroenterology consulted.  Patient started on Creon.   Assessment and Plan:  Acute pancreatitis Abdominal pain with associated Lipase of 4,265 and CT imaging consistent with pancreatitis. Bowel rest and IV analgesic therapy initiated.  Lipase with significant improvement.  Repeat imaging with concern of developing pseudocyst, no necrosis noted. GI started him on Creon which he will need long-term -Continue with full liquid diet and advance as tolerated, again developed worsening abdominal pain after taking soft diet with lunch. -Continue IV analgesics   Hematemesis-resolved Reported episodes prior to admission. Hemoglobin of 15.4 on admission with downward drift to 10.6 and since then, stable and even increasing  Hyponatremia-resolved Resolved with IV fluid.  Likely secondary to dehydration.  Hypokalemia-resolved Improved with repletion.  Hypocalcemia. Improving Likely related to pancreatitis -Continue calcium carbonate  Thrombocytopenia-resolved Mild. Chronic issue. Stable.  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 Recommendation for repeat MRI once acute inflammation has resolved to rule out malignancy. Discussed with  patient at bedside. Had mildly elevated CA 19-9 few months ago, repeat CT scan was arranged by GI but patient never showed up. -Repeat CA 19 9 within normal limit. -Some improvement of that mass on repeat CT today-most likely developing pseudocyst. -GI follow-up on discharge  Influenza A infection-resolved -Completed a course of Tamiflu -Continue with supportive care  Elevated blood pressure No prior history of hypertension. Patient significant hypertensive this admission, but is asymptomatic. Low-dose losartan was added with improvement in blood pressure -Hydralazine PRN for symptomatic hypertension -Continue losartan-titrate as needed  DVT prophylaxis: SCDs Code Status:   Code Status: Full Code Family Communication: Discussed with patient Disposition Plan: Discharge home if he tolerates soft diet, possibly tomorrow   Consultants:  Gastroenterology  Procedures:  None  Antimicrobials: Tamiflu    Subjective: Some mild nausea and abdominal discomfort yesterday.  Objective: BP 127/86 (BP Location: Right Arm)   Pulse 71   Temp (!) 97.4 F (36.3 C) (Oral)   Resp 16   Ht 6' 1"$  (1.854 m)   Wt 65.8 kg   SpO2 98%   BMI 19.13 kg/m   Examination:  General.  Alert and orient x 3, no acute distress. Pulmonary.  Clear to auscultation bilaterally CV.  Regular rate and rhythm, no JVD, rub or murmur. Abdomen.  Soft, nontender, nondistended, BS positive. CNS.  Alert and oriented .  No focal neurologic deficit. Extremities.  No clubbing or cyanosis or edema Psychiatry.  Judgment and insight appears normal.  No acute psychoses  Data Reviewed: I have personally reviewed following labs and imaging studies  CBC Lab Results  Component Value Date   WBC 5.0 12/24/2022   RBC 3.80 (L) 12/24/2022   HGB 11.2 (L) 12/24/2022   HCT 33.3 (L) 12/24/2022   MCV 87.6 12/24/2022  MCH 29.5 12/24/2022   PLT 219 12/24/2022   MCHC 33.6 12/24/2022   RDW 12.5 12/24/2022   LYMPHSABS 1.5  07/02/2022   MONOABS 0.3 07/02/2022   EOSABS 0.3 07/02/2022   BASOSABS 0.0 [id number]     Last metabolic panel Lab Results  Component Value Date   NA 135 12/24/2022   K 3.6 12/24/2022   CL 102 12/24/2022   CO2 26 12/24/2022   BUN <5 (L) 12/24/2022   CREATININE 0.70 12/24/2022   GLUCOSE 95 12/24/2022   GFRNONAA >60 12/24/2022   GFRAA >60 05/28/2020   CALCIUM 8.4 (L) 12/24/2022   PHOS 5.1 (H) 12/25/2022   PROT 5.2 (L) 12/24/2022   ALBUMIN 2.5 (L) 12/24/2022   BILITOT 0.7 12/24/2022   ALKPHOS 38 12/24/2022   AST 12 (L) 12/24/2022   ALT 8 12/24/2022   ANIONGAP 7 12/24/2022    GFR: Estimated Creatinine Clearance: 118.8 mL/min (by C-G formula based on SCr of 0.7 mg/dL).  Recent Results (from the past 240 hour(s))  Resp panel by RT-PCR (RSV, Flu A&B, Covid) Anterior Nasal Swab     Status: Abnormal   Collection Time: 12/18/22 11:11 AM   Specimen: Anterior Nasal Swab  Result Value Ref Range Status   SARS Coronavirus 2 by RT PCR NEGATIVE NEGATIVE Final    Comment: (NOTE) SARS-CoV-2 target nucleic acids are NOT DETECTED.  The SARS-CoV-2 RNA is generally detectable in upper respiratory specimens during the acute phase of infection. The lowest concentration of SARS-CoV-2 viral copies this assay can detect is 138 copies/mL. A negative result does not preclude SARS-Cov-2 infection and should not be used as the sole basis for treatment or other patient management decisions. A negative result may occur with  improper specimen collection/handling, submission of specimen other than nasopharyngeal swab, presence of viral mutation(s) within the areas targeted by this assay, and inadequate number of viral copies(<138 copies/mL). A negative result must be combined with clinical observations, patient history, and epidemiological information. The expected result is Negative.  Fact Sheet for Patients:  EntrepreneurPulse.com.au  Fact Sheet for Healthcare Providers:   IncredibleEmployment.be  This test is no t yet approved or cleared by the [place] FDA and  has been authorized for detection and/or diagnosis of SARS-CoV-2 by FDA under an Emergency Use Authorization (EUA). This EUA will remain  in effect (meaning this test can be used) for the duration of the COVID-19 declaration under Section 564(b)(1) of the Act, 21 U.S.C.section 360bbb-3(b)(1), unless the authorization is terminated  or revoked sooner.       Influenza A by PCR POSITIVE (A) NEGATIVE Final   Influenza B by PCR NEGATIVE NEGATIVE Final    Comment: (NOTE) The Xpert Xpress SARS-CoV-2/FLU/RSV plus assay is intended as an aid in the diagnosis of influenza from Nasopharyngeal swab specimens and should not be used as a sole basis for treatment. Nasal washings and aspirates are unacceptable for Xpert Xpress SARS-CoV-2/FLU/RSV testing.  Fact Sheet for Patients: EntrepreneurPulse.com.au  Fact Sheet for Healthcare Providers: IncredibleEmployment.be  This test is not yet approved or cleared by the [place] FDA and has been authorized for detection and/or diagnosis of SARS-CoV-2 by FDA under an Emergency Use Authorization (EUA). This EUA will remain in effect (meaning this test can be used) for the duration of the COVID-19 declaration under Section 564(b)(1) of the Act, 21 U.S.C. section 360bbb-3(b)(1), unless the authorization is terminated or revoked.     Resp Syncytial Virus by PCR NEGATIVE NEGATIVE Final    Comment: (NOTE) Fact  Sheet for Patients: EntrepreneurPulse.com.au  Fact Sheet for Healthcare Providers: IncredibleEmployment.be  This test is not yet approved or cleared by the [place] FDA and has been authorized for detection and/or diagnosis of SARS-CoV-2 by FDA under an Emergency Use Authorization (EUA). This EUA will remain in effect (meaning this test can be used)  for the duration of the COVID-19 declaration under Section 564(b)(1) of the Act, 21 U.S.C. section 360bbb-3(b)(1), unless the authorization is terminated or revoked.  Performed at [facility], [address], [place], [place] [address]       Radiology Studies: No results found.    LOS: 7 days   This record has been created using Systems analyst. Errors have been sought and corrected,but may not always be located. Such creation errors do not reflect on the standard of care.   Lorella Nimrod, MD [facility] 12/25/2022, 4:30 PM   If 7PM-7AM, please contact night-coverage [email or web address]

## 2022-12-26 DIAGNOSIS — E871 Hypo-osmolality and hyponatremia: Secondary | ICD-10-CM | POA: Diagnosis not present

## 2022-12-26 DIAGNOSIS — D696 Thrombocytopenia, unspecified: Secondary | ICD-10-CM | POA: Diagnosis not present

## 2022-12-26 DIAGNOSIS — K859 Acute pancreatitis without necrosis or infection, unspecified: Secondary | ICD-10-CM | POA: Diagnosis not present

## 2022-12-26 DIAGNOSIS — K920 Hematemesis: Secondary | ICD-10-CM | POA: Diagnosis not present

## 2022-12-26 LAB — GLUCOSE, CAPILLARY: Glucose-Capillary: 91 mg/dL (ref 70–99)

## 2022-12-26 MED ORDER — MORPHINE SULFATE (PF) 2 MG/ML IV SOLN
2.0000 mg | INTRAVENOUS | Status: DC | PRN
  Administered 2022-12-26 – 2022-12-27 (×7): 2 mg via INTRAVENOUS
  Filled 2022-12-26 (×7): qty 1

## 2022-12-26 NOTE — Progress Notes (Signed)
PROGRESS NOTE    Shane Debenedetti Sr.  [id number] DOB: 03/19/1986 DOA: 12/18/2022 PCP: Pediatrics, [facility]   Brief Narrative: Shane Bailiff Sr. is a 37 y.o. male with a history of alcohol abuse in remission, alcoholic pancreatitis with pseudocyst, hyponatremia, GERD, DVT not currently on anticoagulation. Patient presented to the emergency room on 2/8 with midepigastric abdominal pain and found to have acute pancreatitis.  Also found to have influenza A.    By 2/11, patient started to feel better.  Diet started on clear liquids and initial attempt to advance diet very slowly due to patient recurrent pain and nausea.  Gastroenterology consulted.  Patient started on Creon.   Assessment and Plan:  Acute pancreatitis Abdominal pain with associated Lipase of 4,265 and CT imaging consistent with pancreatitis. Bowel rest and IV analgesic therapy initiated.  Lipase with significant improvement.  Repeat imaging with concern of developing pseudocyst, no necrosis noted. GI started him on Creon which he will need long-term -Continue with full liquid diet and advance as tolerated, again developed worsening abdominal pain after taking soft diet with lunch.  Due to uncontrolled pain initially, patient placed on MS Contin 15 mg every 12 hours plus every 2 hours fentanyl which she has been taking consistently.  Now that he is doing better, will stop the MS Contin and transition fentanyl to every 4 hours morphine so that patient is not going to acute withdrawals upon discharge.  He is not on any chronic pain medications at home.  Hematemesis-resolved Reported episodes prior to admission. Hemoglobin of 15.4 on admission with downward drift to 10.6 and since then, stable and even increasing.  Check labs in the morning  Hyponatremia-resolved Resolved with IV fluid.  Likely secondary to dehydration.  Hypokalemia-resolved Improved with repletion.  Hypocalcemia. Improving Likely related to  pancreatitis -Continue calcium carbonate  Thrombocytopenia-resolved Mild. Chronic issue. Stable.  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 Recommendation for repeat MRI once acute inflammation has resolved to rule out malignancy. Discussed with patient at bedside. Had mildly elevated CA 19-9 few months ago, repeat CT scan was arranged by GI but patient never showed up. -Repeat CA 19 9 within normal limit. -Some improvement of that mass on repeat CT today-most likely developing pseudocyst. -GI follow-up on discharge  Influenza A infection-resolved -Completed a course of Tamiflu -Continue with supportive care  Elevated blood pressure No prior history of hypertension. Patient significant hypertensive this admission, but is asymptomatic. Low-dose losartan was added with improvement in blood pressure -Hydralazine PRN for symptomatic hypertension -Continue losartan-titrate as needed  DVT prophylaxis: SCDs Code Status:   Code Status: Full Code Family Communication: Discussed with patient Disposition Plan: Discharge home tomorrow, once pain medication weaned down appropriately   Consultants:  Gastroenterology  Procedures:  None  Antimicrobials: Tamiflu    Subjective: Tolerating soft diet  Objective: BP (!) 167/104 (BP Location: Right Arm)   Pulse 64   Temp 98 F (36.7 C) (Oral)   Resp 18   Ht 6' 1"$  (1.854 m)   Wt 65.8 kg   SpO2 97%   BMI 19.13 kg/m   Examination:  General.  Alert and orient x 3, no acute distress. Pulmonary.  Clear to auscultation bilaterally CV.  Regular rate and rhythm, no JVD, rub or murmur. Abdomen.  Soft, nontender, nondistended, BS positive. CNS.  Alert and oriented .  No focal neurologic deficit. Extremities.  No clubbing or cyanosis or edema Psychiatry.  Judgment and insight appears normal.  No acute psychoses  Data  Reviewed: I have personally reviewed following labs and imaging studies  CBC Lab Results  Component Value Date   WBC 5.0 12/24/2022   RBC 3.80 (L) 12/24/2022   HGB 11.2 (L) 12/24/2022   HCT 33.3 (L) 12/24/2022   MCV 87.6 12/24/2022   MCH 29.5 12/24/2022   PLT 219 12/24/2022   MCHC 33.6 12/24/2022   RDW 12.5 12/24/2022   LYMPHSABS 1.5 07/02/2022   MONOABS 0.3 07/02/2022   EOSABS 0.3 07/02/2022   BASOSABS 0.0 99991111     Last metabolic panel Lab Results  Component Value Date   NA 135 12/24/2022   K 3.6 12/24/2022   CL 102 12/24/2022   CO2 26 12/24/2022   BUN <5 (L) 12/24/2022   CREATININE 0.70 12/24/2022   GLUCOSE 95 12/24/2022   GFRNONAA >60 12/24/2022   GFRAA >60 05/28/2020   CALCIUM 8.4 (L) 12/24/2022   PHOS 5.1 (H) 12/25/2022   PROT 5.2 (L) 12/24/2022   ALBUMIN 2.5 (L) 12/24/2022   BILITOT 0.7 12/24/2022   ALKPHOS 38 12/24/2022   AST 12 (L) 12/24/2022   ALT 8 12/24/2022   ANIONGAP 7 12/24/2022    GFR: Estimated Creatinine Clearance: 118.8 mL/min (by C-G formula based on SCr of 0.7 mg/dL).  Recent Results (from the past 240 hour(s))  Resp panel by RT-PCR (RSV, Flu A&B, Covid) Anterior Nasal Swab     Status: Abnormal   Collection Time: 12/18/22 11:11 AM   Specimen: Anterior Nasal Swab  Result Value Ref Range Status   SARS Coronavirus 2 by RT PCR NEGATIVE NEGATIVE Final    Comment: (NOTE) SARS-CoV-2 target nucleic acids are NOT DETECTED.  The SARS-CoV-2 RNA is generally detectable in upper respiratory specimens during the acute phase of infection. The lowest concentration of SARS-CoV-2 viral copies this assay can detect is 138 copies/mL. A negative result does not preclude SARS-Cov-2 infection and should not be used as the sole basis for treatment or other patient management decisions. A negative result may occur with  improper specimen collection/handling, submission of specimen other than nasopharyngeal swab, presence of viral mutation(s) within  the areas targeted by this assay, and inadequate number of viral copies(<138 copies/mL). A negative result must be combined with clinical observations, patient history, and epidemiological information. The expected result is Negative.  Fact Sheet for Patients:  EntrepreneurPulse.com.au  Fact Sheet for Healthcare Providers:  IncredibleEmployment.be  This test is no t yet approved or cleared by the [place] FDA and  has been authorized for detection and/or diagnosis of SARS-CoV-2 by FDA under an Emergency Use Authorization (EUA). This EUA will remain  in effect (meaning this test can be used) for the duration of the COVID-19 declaration under Section 564(b)(1) of the Act, 21 U.S.C.section 360bbb-3(b)(1), unless the authorization is terminated  or revoked sooner.       Influenza A by PCR POSITIVE (A) NEGATIVE Final   Influenza B by PCR NEGATIVE NEGATIVE Final    Comment: (NOTE) The Xpert Xpress SARS-CoV-2/FLU/RSV plus assay is intended as an aid in the diagnosis of influenza from Nasopharyngeal swab specimens and should not be used as a sole basis for treatment. Nasal washings and aspirates are unacceptable for Xpert Xpress SARS-CoV-2/FLU/RSV testing.  Fact Sheet for Patients: EntrepreneurPulse.com.au  Fact Sheet for Healthcare Providers: IncredibleEmployment.be  This test is not yet approved or cleared by the [place] and has been authorized for  detection and/or diagnosis of SARS-CoV-2 by FDA under an Emergency Use Authorization (EUA). This EUA will remain in effect (meaning this test can be used) for the duration of the COVID-19 declaration under Section 564(b)(1) of the Act, 21 U.S.C. section 360bbb-3(b)(1), unless the authorization is terminated or revoked.     Resp Syncytial Virus by PCR NEGATIVE NEGATIVE Final    Comment: (NOTE) Fact Sheet for  Patients: EntrepreneurPulse.com.au  Fact Sheet for Healthcare Providers: IncredibleEmployment.be  This test is not yet approved or cleared by the [place] FDA and has been authorized for detection and/or diagnosis of SARS-CoV-2 by FDA under an Emergency Use Authorization (EUA). This EUA will remain in effect (meaning this test can be used) for the duration of the COVID-19 declaration under Section 564(b)(1) of the Act, 21 U.S.C. section 360bbb-3(b)(1), unless the authorization is terminated or revoked.  Performed at [facility], [address], [place], [place] [address]       Radiology Studies: No results found.    LOS: 8 days   Gevena Barre, MD [facility] 12/26/2022, 1:56 PM   If 7PM-7AM, please contact night-coverage [email or web address]

## 2022-12-27 DIAGNOSIS — E871 Hypo-osmolality and hyponatremia: Secondary | ICD-10-CM | POA: Diagnosis not present

## 2022-12-27 DIAGNOSIS — J101 Influenza due to other identified influenza virus with other respiratory manifestations: Secondary | ICD-10-CM | POA: Diagnosis not present

## 2022-12-27 DIAGNOSIS — K863 Pseudocyst of pancreas: Secondary | ICD-10-CM | POA: Diagnosis not present

## 2022-12-27 DIAGNOSIS — K859 Acute pancreatitis without necrosis or infection, unspecified: Secondary | ICD-10-CM | POA: Diagnosis not present

## 2022-12-27 LAB — CBC
HCT: 33.1 % — ABNORMAL LOW (ref 39.0–52.0)
Hemoglobin: 11 g/dL — ABNORMAL LOW (ref 13.0–17.0)
MCH: 29.3 pg (ref 26.0–34.0)
MCHC: 33.2 g/dL (ref 30.0–36.0)
MCV: 88 fL (ref 80.0–100.0)
Platelets: 311 10*3/uL (ref 150–400)
RBC: 3.76 MIL/uL — ABNORMAL LOW (ref 4.22–5.81)
RDW: 12.4 % (ref 11.5–15.5)
WBC: 4.6 10*3/uL (ref 4.0–10.5)
nRBC: 0 % (ref 0.0–0.2)

## 2022-12-27 LAB — GLUCOSE, CAPILLARY: Glucose-Capillary: 96 mg/dL (ref 70–99)

## 2022-12-27 MED ORDER — LOSARTAN POTASSIUM 25 MG PO TABS: 25.0000 mg | ORAL_TABLET | Freq: Every day | ORAL | 3 refills | Status: DC

## 2022-12-27 MED ORDER — PANCRELIPASE (LIP-PROT-AMYL) 36000-114000 UNITS PO CPEP: 72000.0000 [IU] | ORAL_CAPSULE | Freq: Three times a day (TID) | ORAL | 3 refills | Status: DC

## 2022-12-27 MED ORDER — MORPHINE SULFATE (PF) 2 MG/ML IV SOLN: 1.0000 mg | Freq: Four times a day (QID) | INTRAVENOUS | Status: DC | PRN

## 2022-12-27 NOTE — Progress Notes (Signed)
Patient was discharged to home, AVS reviewed and all questions answered. IV removed. NT assisted patient to the exit.

## 2022-12-27 NOTE — Discharge Summary (Signed)
Physician Discharge Summary   Patient: Shane Scarfone Sr. MRN: [id number] DOB: 07/11/1986  Admit date:     12/18/2022  Discharge date: 12/27/22  Discharge Physician: Annita Brod   PCP: Pediatric[facility], [facility]   Recommendations at discharge:   New medication: Losartan 25 p.o. daily New medication: Creon 2 capsules p.o. 3 times daily with meals  Discharge Diagnoses: Principal Problem:   Acute pancreatitis Active Problems:   Hematemesis   Hyponatremia   Thrombocytopenia ([place])   Pancreatic mass   Influenza A   Hypertension   Pancreatic pseudocyst   Acute on chronic pancreatitis ([place])  Resolved Problems:   * No resolved hospital problems. *  Hospital Course: Shane Reade Sr. is a 37 y.o. male with a history of alcohol abuse in remission, alcoholic pancreatitis with pseudocyst, hyponatremia, GERD, DVT not currently on anticoagulation. Patient presented to the emergency room on 2/8 with midepigastric abdominal pain and found to have acute pancreatitis.  Also found to have influenza A.    By 2/11, patient started to feel better.  Diet started on clear liquids and initial attempt to advance diet very slowly due to patient recurrent pain and nausea.  Gastroenterology consulted.  Patient started on Creon.  Assessment and Plan: Acute pancreatitis Abdominal pain with associated Lipase of 4,265 and CT imaging consistent with pancreatitis. Bowel rest and IV analgesic therapy initiated.  Lipase with significant improvement.  Repeat imaging with concern of developing pseudocyst, no necrosis noted. GI started him on Creon which he will need long-term -Continue with full liquid diet and advance as tolerated, again developed worsening abdominal pain after taking soft diet with lunch.   Due to uncontrolled pain initially, patient placed on MS Contin 15 mg every 12 hours plus every 2 hours fentanyl which she has been taking consistently.  Now that he is doing better, will stop the  MS Contin and transition fentanyl to every 4 hours morphine so that patient is not going to acute withdrawals upon discharge.  He is not on any chronic pain medications at home.   Hematemesis-resolved Reported episodes prior to admission. Hemoglobin of 15.4 on admission with downward drift to 10.6 and since then, stable and even increasing.  Hemoglobin on day of discharge at 11.0   Hyponatremia-resolved Resolved with IV fluid.  Likely secondary to dehydration.   Hypokalemia-resolved Improved with repletion.   Hypocalcemia. Improving Likely related to pancreatitis -Continue calcium carbonate   Thrombocytopenia-resolved Mild. Chronic issue. Stable.   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 Recommendation for repeat MRI once acute inflammation has resolved to rule out malignancy. Discussed with patient at bedside. Had mildly elevated CA 19-9 few months ago, repeat CT scan was arranged by GI but patient never showed up. -Repeat CA 19 9 within normal limit. -Some improvement of that mass on repeat CT today-most likely developing pseudocyst. -GI follow-up on discharge   Influenza A infection-resolved -Completed a course of Tamiflu -Continue with supportive care   Elevated blood pressure No prior history of hypertension. Patient significant hypertensive this admission, but is asymptomatic. Low-dose losartan was added with improvement in blood pressure Patient discharged on p.o. losartan       Consultants: None Procedures performed: None Disposition: Home Diet recommendation:  Discharge Diet Orders (From admission, onward)     Start     Ordered   12/27/22 0000  Diet - low sodium heart healthy  12/27/22 1200           Cardiac diet DISCHARGE MEDICATION: Allergies as of 12/27/2022       Reactions   Oxycodone Rash   Rash/itching which  required benadryl to resolve   Sudafed [pseudoephedrine]    History per pt of this being linked with his seizures        Medication List     TAKE these medications    acetaminophen 500 MG tablet Commonly known as: TYLENOL Take 500 mg by mouth every 6 (six) hours as needed for headache, moderate pain or mild pain.   lipase/protease/amylase 36000 UNITS Cpep capsule Commonly known as: CREON Take 2 capsules (72,000 Units total) by mouth 3 (three) times daily with meals.   losartan 25 MG tablet Commonly known as: COZAAR Take 1 tablet (25 mg total) by mouth daily. Start taking on: December 28, 2022        Discharge Exam: Danley Danker Weights   12/18/22 1104  Weight: 65.8 kg   General: Alert and oriented x 3, no acute distress cardiovascular: Regular rate and rhythm, S1-S2 Lungs: Clear to auscultation bilaterally  Condition at discharge: good  The results of significant diagnostics from this hospitalization (including imaging, microbiology, ancillary and laboratory) are listed below for reference.   Imaging Studies: CT ABDOMEN PELVIS W CONTRAST  Result Date: 12/23/2022 CLINICAL DATA:  Alcohol abuse, in remission. Alcoholic pancreatitis with pseudocyst. EXAM: CT ABDOMEN AND PELVIS WITH CONTRAST TECHNIQUE: Multidetector CT imaging of the abdomen and pelvis was performed using the standard protocol following bolus administration of intravenous contrast. RADIATION DOSE REDUCTION: This exam was performed according to the departmental dose-optimization program which includes automated exposure control, adjustment of the mA and/or kV according to patient size and/or use of iterative reconstruction technique. CONTRAST:  31m OMNIPAQUE IOHEXOL 300 MG/ML  SOLN COMPARISON:  12/18/2022.  04/11/2022. FINDINGS: Lower chest: Dependent left base subsegmental atelectasis. Normal heart size with trace left pleural fluid. Hepatobiliary: Normal liver. Normal gallbladder, without biliary ductal dilatation.  Pancreas: Again identified is borderline to mild pancreatic duct dilatation within the body, tail, and neck. Followed to the level of the pancreatic head where it becomes more normal in caliber. Pancreatic uncinate process cystic lesion measures 2.9 x 2.9 cm on 30/2 versus 3.9 x 3.2 cm on the prior exam. More well encapsulated today with suggestion of peripheral enhancement. Probable debris within its dependent portion on 31/2. Anterior pancreatic head cystic lesion of 9 mm on 30/2 is similar to 10 mm on the prior. Peripancreatic edema is most apparent adjacent the head and uncinate process, felt to be slightly decreased. No pancreatic necrosis. Relatively ill-defined fluid within the gastrosplenic ligament measures 11.5 x 7.9 cm on 17/2. Compare 9.2 x 8.8 cm when remeasured in a similar fashion on the prior. Increased extension caudally into the greater omentum including on 27/2. Spleen: Normal in size, without focal abnormality. Adrenals/Urinary Tract: Normal adrenal glands. Normal kidneys, without hydronephrosis. Normal urinary bladder. Stomach/Bowel: The proximal stomach is underdistended but appears thick walled including on 12/02. Portions of the colon appear mildly thick walled, favored to be due to underdistention. Normal terminal ileum. Normal small bowel. Vascular/Lymphatic: Normal caliber of the aorta and branch vessels. The splenic vein is attenuated centrally and favored to be chronically thrombosed in the region of the pancreatic tail and upstream body. Gastroepiploic collaterals again identified. Patent portal vein and SMV. No abdominopelvic adenopathy. Reproductive: Normal prostate. Other: Small volume cul-de-sac fluid, increased. Trace perihepatic ascites, new or increased. No free  intraperitoneal air. Musculoskeletal: No acute osseous abnormality. IMPRESSION: 1. Evolution of complicated pancreatitis. The uncinate process lesion is more well-defined and decreased in size since the prior exam of 5  days ago, most consistent with developing pseudocyst. The pancreatic head smaller lesion is unchanged and likely also a tiny pseudocyst. The left upper quadrant peripancreatic ill-defined fluid collection is slightly enlarged. No evidence of pancreatic necrosis or other acute complication. 2. 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 3. Increase in small volume abdominopelvic fluid. 4. Probable secondary gastritis, accentuated by underdistention. 5. Trace left pleural fluid. 6.  Aortic Atherosclerosis (ICD10-I70.0). Electronically Signed   By: Abigail Miyamoto M.D.   On: 12/23/2022 13:42   CT ABDOMEN PELVIS W CONTRAST  Result Date: 12/18/2022 CLINICAL DATA:  Lower abdominal pain associated with dysuria and hematemesis EXAM: CT ABDOMEN AND PELVIS WITH CONTRAST TECHNIQUE: Multidetector CT imaging of the abdomen and pelvis was performed using the standard protocol following bolus administration of intravenous contrast. RADIATION DOSE REDUCTION: This exam was performed according to the departmental dose-optimization program which includes automated exposure control, adjustment of the mA and/or kV according to patient size and/or use of iterative reconstruction technique. CONTRAST:  139m OMNIPAQUE IOHEXOL 300 MG/ML  SOLN COMPARISON:  CT abdomen and pelvis dated 04/11/2022 and multiple priors dating back to 05/04/2020 FINDINGS: Lower chest: No focal consolidation or pulmonary nodule in the lung bases. No pleural effusion or pneumothorax demonstrated. Partially imaged heart size is normal. Hepatobiliary: No focal hepatic lesions. No intra or extrahepatic biliary ductal dilation. Normal gallbladder. Pancreas: Improved edematous appearance of the pancreas compared to 04/11/2022 with mild residual peripancreatic free fluid and stranding. 10 mm hypoattenuating focus within the anterior aspect of the pancreatic head (2:27) is similar to 05/04/2020. Centered within the uncinate  process, there is a heterogeneous hypoattenuating focus measuring 3.9 x 3.2 cm, increased in size from 2.8 x 2.6 cm. A cystic focus was previously seen in this region on more remote examinations. Irregular pancreatic ductal dilation in the body and tail measuring up to 4 mm. Spleen: Normal in size without focal abnormality. Adrenals/Urinary Tract: No adrenal nodules. No suspicious renal mass, calculi or hydronephrosis. No focal bladder wall thickening. Stomach/Bowel: Normal appearance of the stomach. No evidence of bowel wall thickening, distention, or inflammatory changes. Normal appendix. Vascular/Lymphatic: No significant vascular findings are present. New attenuation of the splenic vein with interval development of left upper quadrant collaterals. No enlarged abdominal or pelvic lymph nodes. Reproductive: Prostate is unremarkable. Other: Increased peripancreatic free fluid without definite encapsulation, for example in the left upper quadrant measuring 10.8 by 6.8 cm (2:14). Ill-defined fluid is also seen tracking inferiorly along the right anterior pararenal fascia and into the pelvis. Free air. Musculoskeletal: No acute or abnormal lytic or blastic osseous lesions. Chronic right twelfth rib fracture. Small fat-containing bilateral inguinal hernias. IMPRESSION: 1. Findings of pancreatitis, most likely acute interstitial pancreatitis, with increased peripancreatic free fluid in the left upper quadrant without definite encapsulation. 2. Increased size of uncinate process mass. While this finding may reflect sequela of recurrent pancreatitis, underlying mass lesion is not excluded. Consider further evaluation with MRI once acute inflammation has resolved. 3. Interval development of chronic-appearing attenuation of the splenic vein with development of left upper quadrant collaterals. Electronically Signed   By: LDarrin NipperM.D.   On: 12/18/2022 13:45    Microbiology: Results for orders placed or performed during  the hospital encounter of 12/18/22  Resp panel by RT-PCR (RSV, Flu A&B, Covid)  Anterior Nasal Swab     Status: Abnormal   Collection Time: 12/18/22 11:11 AM   Specimen: Anterior Nasal Swab  Result Value Ref Range Status   SARS Coronavirus 2 by RT PCR NEGATIVE NEGATIVE Final    Comment: (NOTE) SARS-CoV-2 target nucleic acids are NOT DETECTED.  The SARS-CoV-2 RNA is generally detectable in upper respiratory specimens during the acute phase of infection. The lowest concentration of SARS-CoV-2 viral copies this assay can detect is 138 copies/mL. A negative result does not preclude SARS-Cov-2 infection and should not be used as the sole basis for treatment or other patient management decisions. A negative result may occur with  improper specimen collection/handling, submission of specimen other than nasopharyngeal swab, presence of viral mutation(s) within the areas targeted by this assay, and inadequate number of viral copies(<138 copies/mL). A negative result must be combined with clinical observations, patient history, and epidemiological information. The expected result is Negative.  Fact Sheet for Patients:  EntrepreneurPulse.com.au  Fact Sheet for Healthcare Providers:  IncredibleEmployment.be  This test is no t yet approved or cleared by the [place] FDA and  has been authorized for detection and/or diagnosis of SARS-CoV-2 by FDA under an Emergency Use Authorization (EUA). This EUA will remain  in effect (meaning this test can be used) for the duration of the COVID-19 declaration under Section 564(b)(1) of the Act, 21 U.S.C.section 360bbb-3(b)(1), unless the authorization is terminated  or revoked sooner.       Influenza A by PCR POSITIVE (A) NEGATIVE Final   Influenza B by PCR NEGATIVE NEGATIVE Final    Comment: (NOTE) The Xpert Xpress SARS-CoV-2/FLU/RSV plus assay is intended as an aid in the diagnosis of influenza from  Nasopharyngeal swab specimens and should not be used as a sole basis for treatment. Nasal washings and aspirates are unacceptable for Xpert Xpress SARS-CoV-2/FLU/RSV testing.  Fact Sheet for Patients: EntrepreneurPulse.com.au  Fact Sheet for Healthcare Providers: IncredibleEmployment.be  This test is not yet approved or cleared by the [place] FDA and has been authorized for detection and/or diagnosis of SARS-CoV-2 by FDA under an Emergency Use Authorization (EUA). This EUA will remain in effect (meaning this test can be used) for the duration of the COVID-19 declaration under Section 564(b)(1) of the Act, 21 U.S.C. section 360bbb-3(b)(1), unless the authorization is terminated or revoked.     Resp Syncytial Virus by PCR NEGATIVE NEGATIVE Final    Comment: (NOTE) Fact Sheet for Patients: EntrepreneurPulse.com.au  Fact Sheet for Healthcare Providers: IncredibleEmployment.be  This test is not yet approved or cleared by the [place] FDA and has been authorized for detection and/or diagnosis of SARS-CoV-2 by FDA under an Emergency Use Authorization (EUA). This EUA will remain in effect (meaning this test can be used) for the duration of the COVID-19 declaration under Section 564(b)(1) of the Act, 21 U.S.C. section 360bbb-3(b)(1), unless the authorization is terminated or revoked.  Performed at [facility], [address] [place], [place] [id number]     Labs: CBC: Recent Labs  Lab 12/21/22 0217 12/24/22 0601 12/27/22 0328  WBC 5.2 5.0 4.6  HGB 12.3* 11.2* 11.0*  HCT 34.3* 33.3* 33.1*  MCV 86.0 87.6 88.0  PLT 144* 219 [id number]   Basic Metabolic Panel: Recent Labs  Lab 12/21/22 0217 12/22/22 0832 12/24/22 0601 12/25/22 0452  NA 130* 136 135  --   K 3.6 3.5 3.6  --   CL 99 104 102  --   CO2 20* 24 26  --  GLUCOSE 83 105* 95  --   BUN <5* <5* <5*  --   CREATININE 0.68 0.67  0.70  --   CALCIUM 8.3* 8.5* 8.4*  --   MG  --   --   --  1.7  PHOS  --   --   --  5.1*   Liver Function Tests: Recent Labs  Lab 12/21/22 0217 12/22/22 0832 12/24/22 0601  AST 19 16 12*  ALT 11 11 8  $ ALKPHOS 44 42 38  BILITOT 1.3* 0.7 0.7  PROT 6.1* 5.7* 5.2*  ALBUMIN 2.9* 2.7* 2.5*   CBG: Recent Labs  Lab 12/23/22 1639 12/24/22 0850 12/25/22 0911 12/26/22 0808 12/27/22 0746  GLUCAP 108* 91 88 91 96    Discharge time spent: less than 30 minutes.  Signed: Annita Brod, MD [facility] 12/27/2022

## 2023-02-12 ENCOUNTER — Other Ambulatory Visit: Payer: Self-pay | Admitting: Family Medicine

## 2023-02-12 DIAGNOSIS — K862 Cyst of pancreas: Secondary | ICD-10-CM

## 2023-02-23 ENCOUNTER — Ambulatory Visit
Admission: RE | Admit: 2023-02-23 | Discharge: 2023-02-23 | Disposition: A | Discharge status: Home / Self Care | Payer: Medicaid Other | Source: Ambulatory Visit | Attending: Family Medicine | Admitting: Family Medicine

## 2023-02-23 DIAGNOSIS — K863 Pseudocyst of pancreas: Secondary | ICD-10-CM | POA: Diagnosis present

## 2023-02-23 DIAGNOSIS — K862 Cyst of pancreas: Secondary | ICD-10-CM | POA: Insufficient documentation

## 2023-02-23 MED ORDER — GADOBUTROL 1 MMOL/ML IV SOLN
6.0000 mL | Freq: Once | INTRAVENOUS | Status: AC | PRN
  Administered 2023-02-23: 6 mL via INTRAVENOUS

## 2023-06-23 ENCOUNTER — Other Ambulatory Visit: Payer: Self-pay

## 2023-06-23 ENCOUNTER — Emergency Department: Payer: Medicaid Other

## 2023-06-23 ENCOUNTER — Inpatient Hospital Stay
Admission: EM | Admit: 2023-06-23 | Discharge: 2023-07-05 | DRG: 439 | Disposition: A | Discharge status: Home / Self Care | Payer: Medicaid Other | Attending: Internal Medicine | Admitting: Internal Medicine

## 2023-06-23 DIAGNOSIS — K863 Pseudocyst of pancreas: Secondary | ICD-10-CM | POA: Diagnosis present

## 2023-06-23 DIAGNOSIS — I10 Essential (primary) hypertension: Secondary | ICD-10-CM | POA: Diagnosis present

## 2023-06-23 DIAGNOSIS — Z681 Body mass index (BMI) 19 or less, adult: Secondary | ICD-10-CM | POA: Diagnosis not present

## 2023-06-23 DIAGNOSIS — K8689 Other specified diseases of pancreas: Secondary | ICD-10-CM | POA: Diagnosis not present

## 2023-06-23 DIAGNOSIS — E8770 Fluid overload, unspecified: Secondary | ICD-10-CM | POA: Diagnosis present

## 2023-06-23 DIAGNOSIS — D539 Nutritional anemia, unspecified: Secondary | ICD-10-CM | POA: Diagnosis present

## 2023-06-23 DIAGNOSIS — K7460 Unspecified cirrhosis of liver: Secondary | ICD-10-CM | POA: Diagnosis present

## 2023-06-23 DIAGNOSIS — E611 Iron deficiency: Secondary | ICD-10-CM | POA: Diagnosis present

## 2023-06-23 DIAGNOSIS — K852 Alcohol induced acute pancreatitis without necrosis or infection: Secondary | ICD-10-CM | POA: Diagnosis present

## 2023-06-23 DIAGNOSIS — Z720 Tobacco use: Secondary | ICD-10-CM

## 2023-06-23 DIAGNOSIS — R188 Other ascites: Secondary | ICD-10-CM | POA: Diagnosis present

## 2023-06-23 DIAGNOSIS — K8681 Exocrine pancreatic insufficiency: Secondary | ICD-10-CM | POA: Diagnosis present

## 2023-06-23 DIAGNOSIS — Z811 Family history of alcohol abuse and dependence: Secondary | ICD-10-CM

## 2023-06-23 DIAGNOSIS — E871 Hypo-osmolality and hyponatremia: Secondary | ICD-10-CM | POA: Diagnosis present

## 2023-06-23 DIAGNOSIS — E872 Acidosis, unspecified: Secondary | ICD-10-CM | POA: Diagnosis present

## 2023-06-23 DIAGNOSIS — Z66 Do not resuscitate: Secondary | ICD-10-CM | POA: Diagnosis present

## 2023-06-23 DIAGNOSIS — K5900 Constipation, unspecified: Secondary | ICD-10-CM | POA: Diagnosis present

## 2023-06-23 DIAGNOSIS — Z79899 Other long term (current) drug therapy: Secondary | ICD-10-CM | POA: Diagnosis not present

## 2023-06-23 DIAGNOSIS — R5381 Other malaise: Secondary | ICD-10-CM | POA: Diagnosis present

## 2023-06-23 DIAGNOSIS — Z1152 Encounter for screening for COVID-19: Secondary | ICD-10-CM | POA: Diagnosis not present

## 2023-06-23 DIAGNOSIS — K859 Acute pancreatitis without necrosis or infection, unspecified: Secondary | ICD-10-CM | POA: Diagnosis present

## 2023-06-23 DIAGNOSIS — E538 Deficiency of other specified B group vitamins: Secondary | ICD-10-CM | POA: Diagnosis present

## 2023-06-23 DIAGNOSIS — I160 Hypertensive urgency: Secondary | ICD-10-CM | POA: Diagnosis present

## 2023-06-23 DIAGNOSIS — F1021 Alcohol dependence, in remission: Secondary | ICD-10-CM | POA: Diagnosis present

## 2023-06-23 DIAGNOSIS — Z800 Family history of malignant neoplasm of digestive organs: Secondary | ICD-10-CM | POA: Diagnosis not present

## 2023-06-23 DIAGNOSIS — K860 Alcohol-induced chronic pancreatitis: Secondary | ICD-10-CM | POA: Diagnosis not present

## 2023-06-23 DIAGNOSIS — F17200 Nicotine dependence, unspecified, uncomplicated: Secondary | ICD-10-CM | POA: Diagnosis present

## 2023-06-23 DIAGNOSIS — E440 Moderate protein-calorie malnutrition: Secondary | ICD-10-CM | POA: Diagnosis present

## 2023-06-23 DIAGNOSIS — E43 Unspecified severe protein-calorie malnutrition: Secondary | ICD-10-CM | POA: Diagnosis present

## 2023-06-23 DIAGNOSIS — F1011 Alcohol abuse, in remission: Secondary | ICD-10-CM | POA: Diagnosis not present

## 2023-06-23 LAB — URINALYSIS, W/ REFLEX TO CULTURE (INFECTION SUSPECTED)
Bacteria, UA: NONE SEEN
Bilirubin Urine: NEGATIVE
Glucose, UA: NEGATIVE mg/dL
Hgb urine dipstick: NEGATIVE
Ketones, ur: 20 mg/dL — AB
Leukocytes,Ua: NEGATIVE
Nitrite: NEGATIVE
Protein, ur: 30 mg/dL — AB
Specific Gravity, Urine: >1.046 — ABNORMAL HIGH (ref 1.005–1.030)
pH: 5 (ref 5.0–8.0)

## 2023-06-23 LAB — COMPREHENSIVE METABOLIC PANEL
ALT: 5 U/L (ref 0–44)
AST: 17 U/L (ref 15–41)
Albumin: 2.8 g/dL — ABNORMAL LOW (ref 3.5–5.0)
Alkaline Phosphatase: 44 U/L (ref 38–126)
Anion gap: 17 — ABNORMAL HIGH (ref 5–15)
BUN: UNDETERMINED mg/dL (ref 6–20)
CO2: 19 mmol/L — ABNORMAL LOW (ref 22–32)
Calcium: 8.6 mg/dL — ABNORMAL LOW (ref 8.9–10.3)
Chloride: 88 mmol/L — ABNORMAL LOW (ref 98–111)
Creatinine, Ser: UNDETERMINED mg/dL (ref 0.61–1.24)
Glucose, Bld: 124 mg/dL — ABNORMAL HIGH (ref 70–99)
Potassium: 4.5 mmol/L (ref 3.5–5.1)
Sodium: 124 mmol/L — ABNORMAL LOW (ref 135–145)
Total Bilirubin: 1.5 mg/dL — ABNORMAL HIGH (ref 0.3–1.2)
Total Protein: 6.7 g/dL (ref 6.5–8.1)

## 2023-06-23 LAB — NA AND K (SODIUM & POTASSIUM), RAND UR
Potassium Urine: 25 mmol/L
Sodium, Ur: <10 mmol/L

## 2023-06-23 LAB — OSMOLALITY, URINE: Osmolality, Ur: 691 mOsm/kg (ref 300–900)

## 2023-06-23 LAB — TSH: TSH: 1.713 u[IU]/mL (ref 0.350–4.500)

## 2023-06-23 LAB — RESP PANEL BY RT-PCR (RSV, FLU A&B, COVID)  RVPGX2
Influenza A by PCR: NEGATIVE
Influenza B by PCR: NEGATIVE
Resp Syncytial Virus by PCR: NEGATIVE
SARS Coronavirus 2 by RT PCR: NEGATIVE

## 2023-06-23 LAB — PROTIME-INR
INR: 1.1 (ref 0.8–1.2)
Prothrombin Time: 14.7 seconds (ref 11.4–15.2)

## 2023-06-23 LAB — CBC
HCT: 49.1 % (ref 39.0–52.0)
Hemoglobin: 16.5 g/dL (ref 13.0–17.0)
MCH: 29.2 pg (ref 26.0–34.0)
MCHC: 33.6 g/dL (ref 30.0–36.0)
MCV: 86.7 fL (ref 80.0–100.0)
Platelets: 301 10*3/uL (ref 150–400)
RBC: 5.66 MIL/uL (ref 4.22–5.81)
RDW: 13.8 % (ref 11.5–15.5)
WBC: 14.5 10*3/uL — ABNORMAL HIGH (ref 4.0–10.5)
nRBC: 0 % (ref 0.0–0.2)

## 2023-06-23 LAB — OSMOLALITY: Osmolality: 271 mOsm/kg — ABNORMAL LOW (ref 275–295)

## 2023-06-23 LAB — APTT: aPTT: 30 seconds (ref 24–36)

## 2023-06-23 LAB — LACTIC ACID, PLASMA
Lactic Acid, Venous: 2 mmol/L (ref 0.5–1.9)
Lactic Acid, Venous: 1 mmol/L (ref 0.5–1.9)

## 2023-06-23 LAB — LIPASE, BLOOD
Lipase: UNDETERMINED U/L (ref 11–51)
Lipase: 386 U/L — ABNORMAL HIGH (ref 11–51)

## 2023-06-23 LAB — CREATININE, SERUM
Creatinine, Ser: 0.93 mg/dL (ref 0.61–1.24)
GFR, Estimated: >60 mL/min (ref >60)

## 2023-06-23 LAB — BUN: BUN: 14 mg/dL (ref 6–20)

## 2023-06-23 MED ORDER — SODIUM CHLORIDE 0.9 % IV SOLN: INTRAVENOUS | Status: DC

## 2023-06-23 MED ORDER — LACTATED RINGERS IV SOLN: INTRAVENOUS | Status: DC

## 2023-06-23 MED ORDER — SODIUM CHLORIDE 0.9 % IV SOLN
2.0000 g | Freq: Once | INTRAVENOUS | Status: AC
  Administered 2023-06-23: 2 g via INTRAVENOUS
  Filled 2023-06-23: qty 20

## 2023-06-23 MED ORDER — ACETAMINOPHEN 650 MG RE SUPP: 650.0000 mg | Freq: Four times a day (QID) | RECTAL | Status: DC | PRN

## 2023-06-23 MED ORDER — MAGNESIUM HYDROXIDE 400 MG/5ML PO SUSP: 30.0000 mL | Freq: Every day | ORAL | Status: DC | PRN

## 2023-06-23 MED ORDER — SODIUM CHLORIDE 0.9 % IV BOLUS
1000.0000 mL | Freq: Once | INTRAVENOUS | Status: AC
  Administered 2023-06-23: 1000 mL via INTRAVENOUS

## 2023-06-23 MED ORDER — ONDANSETRON HCL 4 MG/2ML IJ SOLN
4.0000 mg | Freq: Once | INTRAMUSCULAR | Status: AC
  Administered 2023-06-23: 4 mg via INTRAVENOUS
  Filled 2023-06-23: qty 2

## 2023-06-23 MED ORDER — TRAZODONE HCL 50 MG PO TABS
25.0000 mg | ORAL_TABLET | Freq: Every evening | ORAL | Status: DC | PRN
  Administered 2023-06-24: 25 mg via ORAL
  Filled 2023-06-23 (×2): qty 1

## 2023-06-23 MED ORDER — MORPHINE SULFATE (PF) 4 MG/ML IV SOLN
4.0000 mg | Freq: Once | INTRAVENOUS | Status: AC
  Administered 2023-06-23: 4 mg via INTRAVENOUS
  Filled 2023-06-23: qty 1

## 2023-06-23 MED ORDER — IOHEXOL 300 MG/ML  SOLN
100.0000 mL | Freq: Once | INTRAMUSCULAR | Status: AC | PRN
  Administered 2023-06-23: 100 mL via INTRAVENOUS

## 2023-06-23 MED ORDER — METRONIDAZOLE 500 MG/100ML IV SOLN
500.0000 mg | Freq: Once | INTRAVENOUS | Status: DC
  Filled 2023-06-23: qty 100

## 2023-06-23 MED ORDER — ACETAMINOPHEN 325 MG PO TABS: 650.0000 mg | ORAL_TABLET | Freq: Four times a day (QID) | ORAL | Status: DC | PRN

## 2023-06-23 MED ORDER — SODIUM CHLORIDE 0.9 % IV BOLUS (SEPSIS)
1000.0000 mL | Freq: Once | INTRAVENOUS | Status: AC
  Administered 2023-06-23: 1000 mL via INTRAVENOUS

## 2023-06-23 MED ORDER — ONDANSETRON HCL 4 MG/2ML IJ SOLN: 4.0000 mg | Freq: Four times a day (QID) | INTRAMUSCULAR | Status: DC | PRN

## 2023-06-23 MED ORDER — LOSARTAN POTASSIUM 25 MG PO TABS
25.0000 mg | ORAL_TABLET | Freq: Every day | ORAL | Status: DC
  Administered 2023-06-23 – 2023-06-24 (×2): 25 mg via ORAL
  Filled 2023-06-23 (×2): qty 1

## 2023-06-23 MED ORDER — IOHEXOL 300 MG/ML  SOLN: 100.0000 mL | Freq: Once | INTRAMUSCULAR | Status: DC | PRN

## 2023-06-23 MED ORDER — FENTANYL CITRATE PF 50 MCG/ML IJ SOSY
50.0000 ug | PREFILLED_SYRINGE | Freq: Once | INTRAMUSCULAR | Status: AC
  Administered 2023-06-23: 50 ug via INTRAVENOUS
  Filled 2023-06-23: qty 1

## 2023-06-23 MED ORDER — LABETALOL HCL 5 MG/ML IV SOLN
20.0000 mg | INTRAVENOUS | Status: DC | PRN
  Administered 2023-06-23: 20 mg via INTRAVENOUS
  Filled 2023-06-23: qty 4

## 2023-06-23 MED ORDER — MORPHINE SULFATE (PF) 2 MG/ML IV SOLN
2.0000 mg | INTRAVENOUS | Status: DC | PRN
  Administered 2023-06-23 – 2023-06-24 (×3): 2 mg via INTRAVENOUS
  Filled 2023-06-23 (×4): qty 1

## 2023-06-23 MED ORDER — PANCRELIPASE (LIP-PROT-AMYL) 12000-38000 UNITS PO CPEP
72000.0000 [IU] | ORAL_CAPSULE | Freq: Three times a day (TID) | ORAL | Status: DC
  Administered 2023-06-24 – 2023-06-25 (×3): 72000 [IU] via ORAL
  Filled 2023-06-23 (×5): qty 6

## 2023-06-23 MED ORDER — METRONIDAZOLE 500 MG/100ML IV SOLN
500.0000 mg | Freq: Once | INTRAVENOUS | Status: AC
  Administered 2023-06-23: 500 mg via INTRAVENOUS
  Filled 2023-06-23: qty 100

## 2023-06-23 MED ORDER — KETOROLAC TROMETHAMINE 15 MG/ML IJ SOLN
15.0000 mg | Freq: Four times a day (QID) | INTRAMUSCULAR | Status: AC | PRN
  Administered 2023-06-23 – 2023-06-28 (×13): 15 mg via INTRAVENOUS
  Filled 2023-06-23 (×15): qty 1

## 2023-06-23 MED ORDER — ENOXAPARIN SODIUM 40 MG/0.4ML IJ SOSY
40.0000 mg | PREFILLED_SYRINGE | INTRAMUSCULAR | Status: DC
  Administered 2023-06-23: 40 mg via SUBCUTANEOUS
  Filled 2023-06-23: qty 0.4

## 2023-06-23 MED ORDER — ONDANSETRON HCL 4 MG PO TABS: 4.0000 mg | ORAL_TABLET | Freq: Four times a day (QID) | ORAL | Status: DC | PRN

## 2023-06-23 NOTE — ED Provider Notes (Addendum)
[facility] Provider Note    Event Date/Time   First MD Initiated Contact with Patient 06/23/23 1916     (approximate)   History   Abdominal Pain   HPI  Shane Heredia Sr. is a 37 y.o. male   Past medical history of prior alcohol use and pancreatitis associate with his alcohol use who presents emerged apartment with 1 week of worsening abdominal pain.  Acutely worsened today.  Associate with nausea but no vomiting.  He has been constipated passing gas.  He states dark urine but no dysuria.  He denies any fever.  Pain in his periumbilical area became severe today.  No history abdominal surgeries.  No longer drinks alcohol and has not had alcohol for 6 months.  Independent Historian contributed to assessment above: His friend is at bedside to corroborate information given above.  External Medical Documents Reviewed: Discharge summary from February 2024 when he was admitted for acute pancreatitis with pseudocyst and was hyponatremic then, was started on Creon      Physical Exam   Triage Vital Signs: ED Triage Vitals  Encounter Vitals Group     BP 06/23/23 1758 (!) 147/100     Systolic BP Percentile --      Diastolic BP Percentile --      Pulse Rate 06/23/23 1801 (!) 163     Resp 06/23/23 1758 (!) 24     Temp 06/23/23 1758 98 F (36.7 C)     Temp Source 06/23/23 1758 Oral     SpO2 06/23/23 1758 100 %     Weight 06/23/23 1758 140 lb (63.5 kg)     Height 06/23/23 1758 6\' 1"  (1.854 m)     Head Circumference --      Peak Flow --      Pain Score 06/23/23 1758 9     Pain Loc --      Pain Education --      Exclude from Growth Chart --     Most recent vital signs: Vitals:   06/23/23 1853 06/23/23 2000  BP:    Pulse: (!) 120 (!) 109  Resp: 15 (!) 25  Temp:    SpO2: 98% 100%    General: Awake, no distress.  CV:  Good peripheral perfusion.  Resp:  Normal effort.  Abd:  No distention.  Other:  Hypertensive and tachycardic.  Afebrile.  Peritoneal  abdomen, rigidity and tenderness diffusely.   ED Results / Procedures / Treatments   Labs (all labs ordered are listed, but only abnormal results are displayed) Labs Reviewed  COMPREHENSIVE METABOLIC PANEL - Abnormal; Notable for the following components:      Result Value   Sodium 124 (*)    Chloride 88 (*)    CO2 19 (*)    Glucose, Bld 124 (*)    Calcium 8.6 (*)    Albumin 2.8 (*)    Total Bilirubin 1.5 (*)    Anion gap 17 (*)    All other components within normal limits  CBC - Abnormal; Notable for the following components:   WBC 14.5 (*)    All other components within normal limits  LACTIC ACID, PLASMA - Abnormal; Notable for the following components:   Lactic Acid, Venous 2.0 (*)    All other components within normal limits  LIPASE, BLOOD - Abnormal; Notable for the following components:   Lipase 386 (*)    All other components within normal limits  RESP PANEL BY RT-PCR (RSV, FLU  A&B, COVID)  [id number]  CULTURE, BLOOD (ROUTINE X 2)  CULTURE, BLOOD (ROUTINE X 2)  LIPASE, BLOOD  PROTIME-INR  APTT  BUN  CREATININE, SERUM  LACTIC ACID, PLASMA  URINALYSIS, W/ REFLEX TO CULTURE (INFECTION SUSPECTED)     I ordered and reviewed the above labs they are notable for hyponatremic 124, white blood cell count elevated 14.5 and lactic acid of 2.0  EKG  ED ECG REPORT I, Pilar Jarvis, the attending physician, personally viewed and interpreted this ECG.   Date: 06/23/2023  EKG Time: 1801  Rate: 150  Rhythm: sinus tachycardia  Axis: nl  Intervals:none  ST&T Change: no stemi    RADIOLOGY I independently reviewed and interpreted CT of the abdomen pelvis see no obvious inflammatory or obstructive changes. I also reviewed radiologist's formal read.   PROCEDURES:  Critical Care performed: Yes, see critical care procedure note(s)  .Critical Care  Performed by: Pilar Jarvis, MD Authorized by: Pilar Jarvis, MD   Critical care provider statement:    Critical care time  (minutes):  30   Critical care was time spent personally by me on the following activities:  Development of treatment plan with patient or surrogate, discussions with consultants, evaluation of patient's response to treatment, examination of patient, ordering and review of laboratory studies, ordering and review of radiographic studies, ordering and performing treatments and interventions, pulse oximetry, re-evaluation of patient's condition and review of old charts    MEDICATIONS ORDERED IN ED: Medications  lactated ringers infusion (has no administration in time range)  metroNIDAZOLE (FLAGYL) IVPB 500 mg (500 mg Intravenous New Bag/Given 06/23/23 1956)  sodium chloride 0.9 % bolus 1,000 mL (0 mLs Intravenous Stopped 06/23/23 1951)  cefTRIAXone (ROCEPHIN) 2 g in sodium chloride 0.9 % 100 mL IVPB (0 g Intravenous Stopped 06/23/23 1905)  fentaNYL (SUBLIMAZE) injection 50 mcg (50 mcg Intravenous Given 06/23/23 1842)  ondansetron (ZOFRAN) injection 4 mg (4 mg Intravenous Given 06/23/23 1842)  iohexol (OMNIPAQUE) 300 MG/ML solution 100 mL (100 mLs Intravenous Contrast Given 06/23/23 1937)  sodium chloride 0.9 % bolus 1,000 mL (1,000 mLs Intravenous New Bag/Given 06/23/23 1956)  morphine (PF) 4 MG/ML injection 4 mg (4 mg Intravenous Given 06/23/23 1958)    External physician / consultants:  I spoke with hospitalist for admission and regarding care plan for this patient.   IMPRESSION / MDM / ASSESSMENT AND PLAN / ED COURSE  I reviewed the triage vital signs and the nursing notes.                                Patient's presentation is most consistent with acute presentation with potential threat to life or bodily function.  Differential diagnosis includes, but is not limited to, pancreatitis, perforated viscus, intra-abdominal infection like appendicitis, cholecystitis, diverticulitis, sepsis, urinary tract infection   The patient is on the cardiac monitor to evaluate for evidence of arrhythmia  and/or significant heart rate changes.  MDM: This is a patient with a rigid abdomen concerning for surgical abdomen like perforated viscus, complicated pancreatitis, intra-abdominal infection.  He is tachycardic, has leukocytosis, concern for sepsis.  He was given 2 L IV crystalloid in addition to IV ceftriaxone and metronidazole to cover for most likely intra-abdominal infectious, in addition to fentanyl for pain.  CT scan of the abdomen pelvis and given the rigidity in his abdomen will defer lab testing to expedite the scan.   CT scan shows pseudocyst like prior, pancreatitis,  mild, and no other acute intra-abdominal pathologies.  Patient remains in pain, though his heart rate got better with some pain medication.  Admission.        FINAL CLINICAL IMPRESSION(S) / ED DIAGNOSES   Final diagnoses:  Acute pancreatitis, unspecified complication status, unspecified pancreatitis type  Pseudocyst of pancreas     Rx / DC Orders   ED Discharge Orders     None        Note:  This document was prepared using Dragon voice recognition software and may include unintentional dictation errors.    Pilar Jarvis, MD 06/23/23 Berton Bon    Pilar Jarvis, MD 06/23/23 2005

## 2023-06-23 NOTE — Assessment & Plan Note (Signed)
-   This is partly related to pain. - We will continue his Cozaar. - He will be placed on as needed IV labetalol.

## 2023-06-23 NOTE — ED Triage Notes (Signed)
Pt sts that he has been having abd pain for the last week. Pt sts that it started out with cramping now it is pain. Pt last BM was 4 days ago and is was all diarrhea. Nausea and Vomiting

## 2023-06-23 NOTE — ED Notes (Signed)
Lab called with critical lactic 2.0

## 2023-06-23 NOTE — Assessment & Plan Note (Signed)
-   continue his Creon.

## 2023-06-23 NOTE — H&P (Addendum)
[place]   PATIENT NAME: Shane Jenkins    MR#:  [id number]  DATE OF BIRTH:  [birth year]  DATE OF ADMISSION:  06/23/2023  PRIMARY CARE PHYSICIAN: Pediatrics, [facility]   Patient is coming from: Home  REQUESTING/REFERRING PHYSICIAN: Pilar Jarvis, MD  CHIEF COMPLAINT:   Chief Complaint  Patient presents with   Abdominal Pain    HISTORY OF PRESENT ILLNESS:  Shane Tesfay Sr. is a 37 y.o. Caucasian male with medical history significant for essential hypertension, alcohol abuse, currently sober for 6 months and alcoholic pancreatitis with pancreatic insufficiency, who presented to the emergency room with acute onset of worsening abdominal pain over the last week mainly in the epigastric area in the left upper quadrant, which significantly deteriorated today with associated nausea without vomiting.  He has been having constipation but is passing flatus.  No fever or chills.  No dysuria, oliguria or hematuria or flank pain.  ED Course: When he came to the ER, BP was 147/100 with respiratory to 24 with otherwise normal vital signs.  Labs revealed hyponatremia 124 and above chloremia of 88 with CO2 of 19 and glucose 124, calcium 8.6 and anion gap of 17.  Albumin was 2.8 and total bili 1.5.  Serum lipase came back elevated at 386.  Lactic acid was 2 and CBC showed leukocytosis of 14.5.  Coagulation profile was normal.  Respiratory panel including COVID-19 PCR came back negative.  Blood cultures were drawn.     EKG as reviewed by me : EKG showed sinus tachycardia with rate 150 with right atrial enlargement and minimal voltage criteria for LVH with peaked T waves and true lateral leads and T wave inversion inferiorly.    Imaging: Abdominal and pelvic CT scan with contrast revealed the following: Possible mild acute pancreatitis, equivocal. Correlate with laboratory evaluation.   Associated small pseudocysts measuring up to 17 mm in the uncinate process, mildly  progressive.   Moderate abdominopelvic ascites, progressive. No drainable walled-off fluid collection.   The patient was given 4 mg of IV morphine sulfate and 4 mg of IV Zofran, 50 mcg of IV fentanyl and 2 g of IV Rocephin as well as 2 L bolus of IV normal saline.  He will be admitted to a medical telemetry bed for further evaluation and management. PAST MEDICAL HISTORY:   Past Medical History:  Diagnosis Date   Alcohol use disorder, severe, dependence (HCC)     PAST SURGICAL HISTORY:  No past surgical history on file. He denies any previous surgeries. SOCIAL HISTORY:   Social History   Tobacco Use   Smoking status: Every Day   Smokeless tobacco: Never  Substance Use Topics   Alcohol use: Not Currently    Comment: none since June 2021  He has not had a drink for 6 months.  FAMILY HISTORY:   Family History  Problem Relation Age of Onset   Alcohol abuse Mother     DRUG ALLERGIES:   Allergies  Allergen Reactions   Oxycodone Rash    Rash/itching which required benadryl to resolve   Sudafed [Pseudoephedrine]     History per pt of this being linked with his seizures    REVIEW OF SYSTEMS:   ROS As per history of present illness. All pertinent systems were reviewed above. Constitutional, HEENT, cardiovascular, respiratory, GI, GU, musculoskeletal, neuro, psychiatric, endocrine, integumentary and hematologic systems were reviewed and are otherwise negative/unremarkable except for positive findings mentioned above in the HPI.  MEDICATIONS AT HOME:   Prior to Admission medications   Medication Sig Start Date End Date Taking? Authorizing Provider  acetaminophen (TYLENOL) 500 MG tablet Take 500 mg by mouth every 6 (six) hours as needed for headache, moderate pain or mild pain.    [provider]  lipase/protease/amylase (CREON) 36000 UNITS CPEP capsule Take 2 capsules (72,000 Units total) by mouth 3 (three) times daily with meals. 12/27/22   Hollice Espy, MD   losartan (COZAAR) 25 MG tablet Take 1 tablet (25 mg total) by mouth daily. 12/28/22   Hollice Espy, MD      VITAL SIGNS:  Blood pressure (!) 170/119, pulse (!) 110, temperature 97.8 F (36.6 C), temperature source Oral, resp. rate 20, height 6\' 1"  (1.854 m), weight 63.5 kg, SpO2 100%.  PHYSICAL EXAMINATION:  Physical Exam  GENERAL:  37 y.o.-year-old Caucasian male patient lying in the bed with  mild distress from pain. EYES: Pupils equal, round, reactive to light and accommodation. No scleral icterus. Extraocular muscles intact.  HEENT: Head atraumatic, normocephalic. Oropharynx and nasopharynx clear.  NECK:  Supple, no jugular venous distention. No thyroid enlargement, no tenderness.  LUNGS: Normal breath sounds bilaterally, no wheezing, rales,rhonchi or crepitation. No use of accessory muscles of respiration.  CARDIOVASCULAR: Regular rate and rhythm, S1, S2 normal. No murmurs, rubs, or gallops.  ABDOMEN: Soft, nondistended, with epigastric and left upper quadrant tenderness without rebound tenderness guarding or rigidity.  Bowel sounds present. No organomegaly or mass.  EXTREMITIES: No pedal edema, cyanosis, or clubbing.  NEUROLOGIC: Cranial nerves II through XII are intact. Muscle strength 5/5 in all extremities. Sensation intact. Gait not checked.  PSYCHIATRIC: The patient is alert and oriented x 3.  Normal affect and good eye contact. SKIN: No obvious rash, lesion, or ulcer.   LABORATORY PANEL:   CBC Recent Labs  Lab 06/23/23 1801  WBC 14.5*  HGB 16.5  HCT 49.1  PLT 301   ------------------------------------------------------------------------------------------------------------------  Chemistries  Recent Labs  Lab 06/23/23 1801 06/23/23 1857  NA 124*  --   K 4.5  --   CL 88*  --   CO2 19*  --   GLUCOSE 124*  --   BUN QUANTITY NOT SUFFICIENT, UNABLE TO PERFORM TEST 14  CREATININE QUANTITY NOT SUFFICIENT, UNABLE TO PERFORM TEST 0.93  CALCIUM 8.6*  --   AST  17  --   ALT 5  --   ALKPHOS 44  --   BILITOT 1.5*  --    ------------------------------------------------------------------------------------------------------------------  Cardiac Enzymes No results for input(s): "TROPONINI" in the last 168 hours. ------------------------------------------------------------------------------------------------------------------  RADIOLOGY:  CT ABDOMEN PELVIS W CONTRAST  Result Date: 06/23/2023 CLINICAL DATA:  Abdominal pain, nausea/vomiting EXAM: CT ABDOMEN AND PELVIS WITH CONTRAST TECHNIQUE: Multidetector CT imaging of the abdomen and pelvis was performed using the standard protocol following bolus administration of intravenous contrast. RADIATION DOSE REDUCTION: This exam was performed according to the departmental dose-optimization program which includes automated exposure control, adjustment of the mA and/or kV according to patient size and/or use of iterative reconstruction technique. CONTRAST:  OMNIPAQUE IOHEXOL 300 MG/ML  SOLN COMPARISON:  MRI abdomen dated 02/23/2023. CT abdomen/pelvis dated 12/23/2022. FINDINGS: Lower chest: Lung bases are clear. Hepatobiliary: Liver is within normal limits. Gallbladder is unremarkable. No intrahepatic or extrahepatic duct dilatation. Pancreas: 17 mm pseudocyst in the pancreatic uncinate process, improved from prior CT but mildly progressive from MR. Additional 8 mm pseudocyst along the pancreatic tail (series 2/image 23), mildly progressive. Very mild peripancreatic fluid/inflammatory  changes along the pancreatic head/uncinate process. No drainable walled-off fluid collection. Spleen: Within normal limits. Adrenals/Urinary Tract: Adrenal glands are within normal limits. Kidneys are within normal limits.  No hydronephrosis. Bladder is within normal limits. Stomach/Bowel: Stomach is within normal limits. Mildly prominent but nondilated loops of small bowel in the central abdomen. No evidence of bowel obstruction. Normal  appendix (series 2/image 86). No colonic wall thickening or inflammatory changes. Vascular/Lymphatic: No evidence of abdominal aortic aneurysm. Chronic splenic vein occlusion. No suspicious abdominopelvic lymphadenopathy. Reproductive: Prostate is unremarkable. Other: Moderate abdominopelvic ascites, progressive. Musculoskeletal: Visualized osseous structures are within normal limits. IMPRESSION: Possible mild acute pancreatitis, equivocal. Correlate with laboratory evaluation. Associated small pseudocysts measuring up to 17 mm in the uncinate process, mildly progressive. Moderate abdominopelvic ascites, progressive. No drainable walled-off fluid collection. Electronically Signed   By: Charline Bills M.D.   On: 06/23/2023 19:52      IMPRESSION AND PLAN:  Assessment and Plan: * Acute pancreatitis - The patient be admitted to a medical telemetry bed. - He will be kept NPO. - Pain management will be provided. - We will follow serial lipase levels. 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 - Dr. Everlene Farrier was notified about the patient.   Hypertensive urgency - This is partly related to pain. - We will continue his Cozaar. - He will be placed on as needed IV labetalol.  Hyponatremia - This is likely hypovolemic from recurrent nausea and diminished appetite. - Will obtain urine lytes and urine and serum osmolality as well as TSH. - Patient will be hydrated with IV normal saline. - Will follow BMPs.  Pancreatic insufficiency - We will continue his Creon.  Tobacco abuse - He was Counseled for smoking cessation and will receive further counseling here.   DVT prophylaxis: Lovenox.  Advanced Care Planning:  Code Status: DNR and DNI Family Communication:  The plan of care was discussed in details with the patient (and family). I answered all questions. The patient agreed to proceed with the above mentioned plan. Further management will depend upon hospital  course. Disposition Plan: Back to previous home environment Consults called: General Surgery. All the records are reviewed and case discussed with ED provider.  Status is: Inpatient  At the time of the admission, it appears that the appropriate admission status for this patient is inpatient.  This is judged to be reasonable and necessary in order to provide the required intensity of service to ensure the patient's safety given the presenting symptoms, physical exam findings and initial radiographic and laboratory data in the context of comorbid conditions.  The patient requires inpatient status due to high intensity of service, high risk of further deterioration and high frequency of surveillance required.  I certify that at the time of admission, it is my clinical judgment that the patient will require inpatient hospital care extending more than 2 midnights.                            Dispo: The patient is from: Home              Anticipated d/c is to: Home              Patient currently is not medically stable to d/c.              Difficult to place patient: No  Shane Jenkins M.D on 06/23/2023 at 9:53 PM  [facility]  From 7 PM-7 AM, contact night-coverage [email or web address]  CC: Primary care physician; Pediatrics, [facility]

## 2023-06-23 NOTE — Assessment & Plan Note (Addendum)
-   The patient be admitted to a medical telemetry bed. - He will be kept NPO. - Pain management will be provided. - We will follow serial lipase levels. 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 - Dr. Everlene Farrier was notified about the patient.

## 2023-06-23 NOTE — Consult Note (Signed)
CODE SEPSIS - PHARMACY COMMUNICATION  **Broad Spectrum Antibiotics should be administered within 1 hour of Sepsis diagnosis**  Time Code Sepsis Called/Page Received: 1807  Antibiotics Ordered: ceftriaxone and flagyl   Time of 1st antibiotic administration: 1842  Additional action taken by pharmacy: none     Ronnald Ramp ,PharmD Clinical Pharmacist  06/23/2023  7:01 PM

## 2023-06-23 NOTE — Assessment & Plan Note (Signed)
-   He was Counseled for smoking cessation and will receive further counseling here.

## 2023-06-23 NOTE — Assessment & Plan Note (Addendum)
-   This is likely hypovolemic from recurrent nausea and diminished appetite. - Will obtain urine lytes and urine and serum osmolality as well as TSH. - Patient will be hydrated with IV normal saline. - Will follow BMPs.

## 2023-06-23 NOTE — Consult Note (Incomplete)
CODE SEPSIS - PHARMACY COMMUNICATION  **Broad-spectrum antimicrobials should be administered within one hour of sepsis diagnosis**  Time Code Sepsis call or page was received: 1807  Antibiotics ordered: Ceftriaxone, Metronidazole  Time of first antibiotic administration: ***  Additional action taken by pharmacy: ***  If necessary, name of provider/nurse contacted: ***    Will M. Dareen Piano, PharmD Clinical Pharmacist 06/23/2023 6:15 PM

## 2023-06-23 NOTE — Sepsis Progress Note (Signed)
Code sepsis protocol being monitored by eLink. 

## 2023-06-24 ENCOUNTER — Inpatient Hospital Stay: Payer: Medicaid Other

## 2023-06-24 DIAGNOSIS — K863 Pseudocyst of pancreas: Secondary | ICD-10-CM | POA: Diagnosis not present

## 2023-06-24 DIAGNOSIS — K8681 Exocrine pancreatic insufficiency: Secondary | ICD-10-CM | POA: Diagnosis not present

## 2023-06-24 DIAGNOSIS — K860 Alcohol-induced chronic pancreatitis: Secondary | ICD-10-CM

## 2023-06-24 DIAGNOSIS — F1011 Alcohol abuse, in remission: Secondary | ICD-10-CM

## 2023-06-24 DIAGNOSIS — K859 Acute pancreatitis without necrosis or infection, unspecified: Secondary | ICD-10-CM | POA: Diagnosis not present

## 2023-06-24 LAB — BODY FLUID CELL COUNT WITH DIFFERENTIAL
Lymphs, Fluid: 17 %
Monocyte-Macrophage-Serous Fluid: 43 %
Neutrophil Count, Fluid: 40 %
Total Nucleated Cell Count, Fluid: 1854 cu mm

## 2023-06-24 LAB — PROTEIN, PLEURAL OR PERITONEAL FLUID: Total protein, fluid: 3.4 g/dL

## 2023-06-24 LAB — AMYLASE, PLEURAL OR PERITONEAL FLUID: Amylase, Fluid: >10000 U/L

## 2023-06-24 LAB — ALBUMIN, PLEURAL OR PERITONEAL FLUID: Albumin, Fluid: 1.6 g/dL

## 2023-06-24 LAB — GLUCOSE, PLEURAL OR PERITONEAL FLUID: Glucose, Fluid: 111 mg/dL

## 2023-06-24 LAB — LACTATE DEHYDROGENASE, PLEURAL OR PERITONEAL FLUID: LD, Fluid: 190 U/L — ABNORMAL HIGH (ref 3–23)

## 2023-06-24 MED ORDER — MORPHINE SULFATE (PF) 2 MG/ML IV SOLN
2.0000 mg | INTRAVENOUS | Status: DC | PRN
  Administered 2023-06-24 – 2023-07-04 (×57): 2 mg via INTRAVENOUS
  Filled 2023-06-24 (×56): qty 1

## 2023-06-24 MED ORDER — ALBUMIN HUMAN 25 % IV SOLN
25.0000 g | Freq: Once | INTRAVENOUS | Status: AC
  Administered 2023-06-24: 25 g via INTRAVENOUS
  Filled 2023-06-24: qty 100

## 2023-06-24 MED ORDER — LIDOCAINE HCL (PF) 1 % IJ SOLN
10.0000 mL | Freq: Once | INTRAMUSCULAR | Status: AC
  Administered 2023-06-24: 10 mL via INTRADERMAL

## 2023-06-24 MED ORDER — ORAL CARE MOUTH RINSE: 15.0000 mL | OROMUCOSAL | Status: DC | PRN

## 2023-06-24 NOTE — Consult Note (Signed)
[facility] SURGICAL CONSULTATION NOTE (initial) - cpt: 82956   HISTORY OF PRESENT ILLNESS (HPI):  37 y.o. male presented to [facility] ED yesterday for evaluation of abdominal pain. Patient reports abdominal pain. Patient with a known history of pancreatitis secondary to history of alcohol abuse. He has cease alcohol abuse and is 6 month sober. He reports pain has been worse in his lower abdomen but his presentation is similar to previous episodes of pancreatitis but this is more severe than previous episodes. No fever, chills, nausea, emesis, CP, SOB, urinary changes, or bowel changes. He denied any history of juandice in the past. He is passing gas and having bowel function. Work up in the ED revealed a mild leukocytosis to 14.5K (now 11.8), Hb to 16.5, hyponatremia to 124 (now 128), albumin 2.8, bilirubin 1.5, lipase 386, initial lactic acidosis to 2.0 (now 1.0), INR 1.1. CT Abdomen/Pelvis was obtained and concerning for pancreatic pseudocyst and ascites. Patient admitted to the medicine service.   Surgery is consulted by hospitalist physician Dr. Valente David, MD in this context for evaluation and management of pancreatic pseudocyst.   PAST MEDICAL HISTORY (PMH):  Past Medical History:  Diagnosis Date   Alcohol use disorder, severe, dependence (HCC)      PAST SURGICAL HISTORY (PSH):  No past surgical history on file.   MEDICATIONS:  Prior to Admission medications   Medication Sig Start Date End Date Taking? Authorizing Provider  acetaminophen (TYLENOL) 500 MG tablet Take 500 mg by mouth every 6 (six) hours as needed for headache, moderate pain or mild pain.    [provider]  lipase/protease/amylase (CREON) 36000 UNITS CPEP capsule Take 2 capsules (72,000 Units total) by mouth 3 (three) times daily with meals. 12/27/22   Hollice Espy, MD  losartan (COZAAR) 25 MG tablet Take 1 tablet (25 mg total) by mouth daily. 12/28/22   Hollice Espy, MD     ALLERGIES:   Allergies  Allergen Reactions   Oxycodone Rash    Rash/itching which required benadryl to resolve   Sudafed [Pseudoephedrine]     History per pt of this being linked with his seizures     SOCIAL HISTORY:  Social History   Socioeconomic History   Marital status: Divorced    Spouse name: Not on file   Number of children: Not on file   Years of education: Not on file   Highest education level: Not on file  Occupational History   Not on file  Tobacco Use   Smoking status: Every Day   Smokeless tobacco: Never  Substance and Sexual Activity   Alcohol use: Not Currently    Comment: none since June 2021   Drug use: Not Currently   Sexual activity: Not on file  Other Topics Concern   Not on file  Social History Narrative   Not on file   Social Determinants of Health   Financial Resource Strain: Not on file  Food Insecurity: No Food Insecurity (12/18/2022)   Hunger Vital Sign    Worried About Running Out of Food in the Last Year: Never true    Ran Out of Food in the Last Year: Never true  Transportation Needs: No Transportation Needs (12/18/2022)   PRAPARE - Administrator, Civil Service (Medical): No    Lack of Transportation (Non-Medical): No  Physical Activity: Not on file  Stress: Not on file  Social Connections: Not on file  Intimate Partner Violence: Not At Risk (12/18/2022)   Humiliation,  Afraid, Rape, and Kick questionnaire    Fear of Current or Ex-Partner: No    Emotionally Abused: No    Physically Abused: No    Sexually Abused: No     FAMILY HISTORY:  Family History  Problem Relation Age of Onset   Alcohol abuse Mother       REVIEW OF SYSTEMS:  Review of Systems  Constitutional:  Negative for chills and fever.  Respiratory:  Negative for cough and shortness of breath.   Cardiovascular:  Negative for chest pain and palpitations.  Gastrointestinal:  Positive for abdominal pain. Negative for constipation, diarrhea, nausea and vomiting.   Genitourinary:  Negative for dysuria and urgency.  All other systems reviewed and are negative.   VITAL SIGNS:  Temp:  [97.8 F (36.6 C)-98.1 F (36.7 C)] 98.1 F (36.7 C) (08/14 0428) Pulse Rate:  [102-163] 105 (08/14 0428) Resp:  [15-25] 18 (08/14 0428) BP: (129-178)/(86-119) 129/86 (08/14 0428) SpO2:  [97 %-100 %] 97 % (08/14 0428) Weight:  [63.5 kg] 63.5 kg (08/13 1758)     Height: 6\' 1"  (185.4 cm) Weight: 63.5 kg BMI (Calculated): 18.47   INTAKE/OUTPUT:  08/13 0701 - 08/14 0700 In: 1000 [IV Piggyback:1000] Out: -   PHYSICAL EXAM:  Physical Exam Vitals and nursing note reviewed. Exam conducted with a chaperone present.  Constitutional:      General: He is not in acute distress.    Appearance: He is well-developed and normal weight. He is not ill-appearing.  HENT:     Head: Normocephalic and atraumatic.  Eyes:     General: No scleral icterus.    Extraocular Movements: Extraocular movements intact.  Cardiovascular:     Rate and Rhythm: Normal rate.     Heart sounds: Normal heart sounds. No murmur heard. Pulmonary:     Effort: Pulmonary effort is normal. No respiratory distress.  Abdominal:     General: Abdomen is protuberant. There is distension.     Palpations: Abdomen is soft.     Tenderness: There is abdominal tenderness in the suprapubic area. There is no guarding or rebound.     Comments: Abdomen is soft, he is tender in lower abdomen, certainly distended likely secondary to ascites, no rebound/guarding. He is certainly non-peritonitic   Genitourinary:    Comments: Deferred Skin:    General: Skin is warm and dry.     Coloration: Skin is not jaundiced.  Neurological:     General: No focal deficit present.     Mental Status: He is alert and oriented to person, place, and time.  Psychiatric:        Mood and Affect: Mood normal.        Behavior: Behavior normal.      Labs:     Latest Ref Rng & Units 06/24/2023    5:19 AM 06/23/2023    6:01 PM 12/27/2022     3:28 AM  CBC  WBC 4.0 - 10.5 K/uL 11.8  14.5  4.6   Hemoglobin 13.0 - 17.0 g/dL 40.3  47.4  25.9   Hematocrit 39.0 - 52.0 % 39.8  49.1  33.1   Platelets 150 - 400 K/uL 253  301  311       Latest Ref Rng & Units 06/24/2023    5:19 AM 06/23/2023    6:57 PM 06/23/2023    6:01 PM  CMP  Glucose 70 - 99 mg/dL 563   875   BUN 6 - 20 mg/dL 14  14  QUANTITY NOT  SUFFICIENT, UNABLE TO PERFORM TEST   Creatinine 0.61 - 1.24 mg/dL 9.60  4.54  QUANTITY NOT SUFFICIENT, UNABLE TO PERFORM TEST   Sodium 135 - 145 mmol/L 128   124   Potassium 3.5 - 5.1 mmol/L 4.4   4.5   Chloride 98 - 111 mmol/L 95   88   CO2 22 - 32 mmol/L 24   19   Calcium 8.9 - 10.3 mg/dL 7.8   8.6   Total Protein 6.5 - 8.1 g/dL 5.2   6.7   Total Bilirubin 0.3 - 1.2 mg/dL 1.0   1.5   Alkaline Phos 38 - 126 U/L 34   44   AST 15 - 41 U/L 9   17   ALT 0 - 44 U/L 6   5      Imaging studies:   CT Abdomen/Pelvis (06/23/2023) personally reviewed, noted small pseudocyst of pancreas, marked ascites, no evidence of obstruction, and radiologist report reviewed below:  IMPRESSION: Possible mild acute pancreatitis, equivocal. Correlate with laboratory evaluation.   Associated small pseudocysts measuring up to 17 mm in the uncinate process, mildly progressive.   Moderate abdominopelvic ascites, progressive. No drainable walled-off fluid collection.   Assessment/Plan: (ICD-10's: K49.3) 37 y.o. male with chronic pancreatitis and small pseudocyst also with significant ascites, complicated by history of alcohol abuse.   - Nothing to do from surgical perspective. He will likely benefit from repeat imaging in a few months to reassess this pseudocyst. He is Marjo Bicker B at minimum which carries about 30% mortality risk peri-operatively.    - May benefit from paracentesis; ? If his pain is secondary to ascites/distension - no evidence of bowel obstruction  - Agree with NPO given his continued pain; Okay to initiate CLD once pain improved  -  Monitor abdominal examination  - Pain control prn; antiemetics prn  - Monitor leukocytosis; improving - Mobilize as tolerated   - Further management per primary service; we will be available   All of the above findings and recommendations were discussed with the patient, and all of patient's questions were answered to his expressed satisfaction.  Thank you for the opportunity to participate in this patient's care.   -- Lynden Oxford, PA-C [facility] 06/24/2023, 7:08 AM M-F: 7am - 4pm

## 2023-06-24 NOTE — Procedures (Signed)
PROCEDURE SUMMARY:  Successful ultrasound guided paracentesis from the right lower quadrant.  Yielded 4.2 L of clear yellow fluid.  No immediate complications.  The patient tolerated the procedure well.   Specimen sent for labs.  EBL < 2 mL  If the patient eventually requires >/=2 paracenteses in a 30 day period, screening evaluation by the [facility] will be assessed.  Alwyn Ren, [place] [phone] 06/24/2023, 4:27 PM

## 2023-06-24 NOTE — Progress Notes (Signed)
Progress Note   Patient: Shane Rowlison Sr. ZOX:[id number] DOB: [birth year] DOA: 06/23/2023     1 DOS: the patient was seen and examined on 06/24/2023     Subjective:  Patient seen and examined at bedside this morning Was initially in pain this morning however improved with pain medication Nausea and vomiting much better  Brief hospital course: Shane Pirrello Sr. is a 37 y.o. Caucasian male with medical history significant for essential hypertension, alcohol abuse, currently sober for 6 months and alcoholic pancreatitis with pancreatic insufficiency, who presented to the emergency room with acute onset of worsening abdominal pain over the last week mainly in the epigastric area in the left upper quadrant, which significantly deteriorated today with associated nausea without vomiting.  He has been having constipation but is passing flatus.  No fever or chills.  No dysuria, oliguria or hematuria or flank pain.   ED Course: When he came to the ER, BP was 147/100 with respiratory to 24 with otherwise normal vital signs.  Labs revealed hyponatremia 124 and above chloremia of 88 with CO2 of 19 and glucose 124, calcium 8.6 and anion gap of 17.  Albumin was 2.8 and total bili 1.5.  Serum lipase came back elevated at 386.  Lactic acid was 2 and CBC showed leukocytosis of 14.5.  Coagulation profile was normal.  Respiratory panel including COVID-19 PCR came back negative.  Blood cultures were drawn.      EKG as reviewed by me : EKG showed sinus tachycardia with rate 150 with right atrial enlargement and minimal voltage criteria for LVH with peaked T waves and true lateral leads and T wave inversion inferiorly.    Imaging: Abdominal and pelvic CT scan with contrast revealed the following: Possible mild acute pancreatitis, equivocal. Correlate with laboratory evaluation.   Associated small pseudocysts measuring up to 17 mm in the uncinate process, mildly progressive.   Moderate abdominopelvic ascites,  progressive. No drainable walled-off fluid collection.    Assessment and Plan:   Acute pancreatitis according to patient he has not used any alcohol within the last 6 months Continue current IV fluid Patient pain slightly better and still n.p.o., with clear liquid diet Continue current pain regiment We appreciate the input of surgery as well as gastroenterology team CT scan of the abdomen showed I personally reviewed the patient's CT scan of the abdomen showed findings of acute pancreatitis as well as pancreatic pseudocyst Patient underwent paracentesis with 4.2 L of ascitic fluid removed on 06/24/2023 I have discussed the case with gastroenterology as well as surgery team   Hypertensive urgency - This is partly related to pain. - Continue losartan - Continue as needed IV labetalol.   Hyponatremia - Likely secondary to hypovolemia in the setting of nausea and vomiting Continue monitoring Continue IV fluid therapy   Pancreatic insufficiency - Continue his Creon.   Tobacco abuse - He was Counseled for smoking cessation and will receive further counseling here.     DVT prophylaxis: Lovenox.   Advanced Care Planning:  Code Status: Full code  Family Communication: None present at bedside    Physical Exam: Middle-aged male in no acute distress EYES: Pupils equal, round, reactive to light and accommodation. No scleral icterus. Extraocular muscles intact.  HEENT: Head atraumatic, normocephalic. Oropharynx and nasopharynx clear.  NECK:  Supple, no jugular venous distention. No thyroid enlargement, no tenderness.  LUNGS: Normal breath sounds bilaterally, no wheezing, rales,rhonchi or crepitation. No use of accessory muscles of respiration.  CARDIOVASCULAR: Regular rate and rhythm,  S1, S2 normal. No murmurs, rubs, or gallops.  ABDOMEN: Abdomen firm generalized tenderness  EXTREMITIES: No pedal edema, cyanosis, or clubbing.  NEUROLOGIC: Cranial nerves II through XII are intact.  Muscle strength 5/5 in all extremities. Sensation intact. Gait not checked.  PSYCHIATRIC: The patient is alert and oriented x 3.  Normal affect and good eye contact. SKIN: No obvious rash, lesion, or ulcer.   Vitals:   06/24/23 0428 06/24/23 0727 06/24/23 1450 06/24/23 1727  BP: 129/86 117/81 (!) 122/93 132/79  Pulse: (!) 105 (!) 102 (!) 113 (!) 105  Resp: 18 16 18 16   Temp: 98.1 F (36.7 C) 97.8 F (36.6 C) (!) 97.5 F (36.4 C) 98.2 F (36.8 C)  TempSrc: Oral Oral Oral Oral  SpO2: 97% 97% 100% 100%  Weight:      Height:        Data Reviewed: I have reviewed patient's lab results as well as CT scan finding as shown below and above     Latest Ref Rng & Units 06/24/2023    5:19 AM 06/23/2023    6:01 PM 12/27/2022    3:28 AM  CBC  WBC 4.0 - 10.5 K/uL 11.8  14.5  4.6   Hemoglobin 13.0 - 17.0 g/dL 31.5  17.6  16.0   Hematocrit 39.0 - 52.0 % 39.8  49.1  33.1   Platelets 150 - 400 K/uL 253  301  311        Latest Ref Rng & Units 06/24/2023    5:19 AM 06/23/2023    6:57 PM 06/23/2023    6:01 PM  CMP  Glucose 70 - 99 mg/dL 737   106   BUN 6 - 20 mg/dL 14  14  QUANTITY NOT SUFFICIENT, UNABLE TO PERFORM TEST   Creatinine 0.61 - 1.24 mg/dL 2.69  4.85  QUANTITY NOT SUFFICIENT, UNABLE TO PERFORM TEST   Sodium 135 - 145 mmol/L 128   124   Potassium 3.5 - 5.1 mmol/L 4.4   4.5   Chloride 98 - 111 mmol/L 95   88   CO2 22 - 32 mmol/L 24   19   Calcium 8.9 - 10.3 mg/dL 7.8   8.6   Total Protein 6.5 - 8.1 g/dL 5.2   6.7   Total Bilirubin 0.3 - 1.2 mg/dL 1.0   1.5   Alkaline Phos 38 - 126 U/L 34   44   AST 15 - 41 U/L 9   17   ALT 0 - 44 U/L 6   5     Family Communication: No family present at bedside    Author: Loyce Dys, MD 06/24/2023 5:42 PM  For on call review [email or web address].

## 2023-06-24 NOTE — TOC CM/SW Note (Signed)
Transition of Care [facility]) - Inpatient Brief Assessment   Patient Details  Name: Shane Zaugg Sr. MRN: [id number] Date of Birth: [birth year]  Transition of Care [facility]) CM/SW Contact:    Chapman Fitch, RN Phone Number: 06/24/2023, 11:17 AM   Clinical Narrative:    Transition of Care [facility]) Screening Note   Patient Details  Name: Shane Ha Sr. Date of Birth: 1986-07-14   Transition of Care [facility]) CM/SW Contact:    Chapman Fitch, RN Phone Number: 06/24/2023, 11:17 AM    Transition of Care Department [facility]) has reviewed patient and no TOC needs have been identified at this time. We will continue to monitor patient advancement through interdisciplinary progression rounds. If new patient transition needs arise, please place a TOC consult.   Transition of Care Asessment: Insurance and Status: Insurance coverage has been reviewed Patient has primary care physician: Yes     Prior/Current Home Services: No current home services Social Determinants of Health Reivew: SDOH reviewed no interventions necessary Readmission risk has been reviewed: Yes Transition of care needs: no transition of care needs at this time

## 2023-06-24 NOTE — Consult Note (Signed)
[facility] GI Inpatient Consult Note   Shane Jenkins, M.D.  Reason for Consult: Alcoholic pancreatitis, ascites   Attending Requesting Consult: Sterling Big, M.D.  History of Present Illness: Shane Salib Sr. is a 37 y.o. 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  GI has been asked to evaluate the patient's secondary to "possible pancreatic ascites". Patient reports he has been drinking alcohol in the form of malt liquor and regular beer for the last several years, beginning when he was 37 years old.  He drank anywhere between 12 and 16 beers a day on a daily basis.  He stopped drinking about 6 months ago after he was admitted after having a syncopal episode. Patient has been complaining of progressive abdominal distention over the last 3 to 6 months and has been evaluated for this in the past.  His abdomen has increased in size progressively causing his pants to be tighter than usual.  Patient just returned from elective ultrasound-guided paracentesis of over 4 L.  Studies are pending. Patient admits to recurrent pain in the epigastrium along with intermittent nausea and vomiting while abstinent from alcohol.  He has been tentatively diagnosed with exocrine pancreatic insufficiency and has been placed on pancreatic enzyme supplements in the outpatient setting.  He denies any jaundice, pruritus, peripheral edema.  He has not had any seizures since childhood. Patient denies any hematemesis melena or hematochezia.  He does remark having both parents die from colon cancer.  Father was approximately 5 years old and the mother 76 years old when they passed away.  Past Medical History:  Past Medical History:  Diagnosis Date   Alcohol use disorder,  severe, dependence (HCC)     Problem List: Patient Active Problem List   Diagnosis Date Noted   Pancreatic insufficiency 06/23/2023   Tobacco abuse 06/23/2023   Pseudocyst of pancreas 06/23/2023   Pancreatic pseudocyst 12/23/2022   Acute on chronic pancreatitis (HCC) 12/23/2022   Hematemesis 12/18/2022   Pancreatic mass 12/18/2022   Influenza A 12/18/2022   Hypertension 12/18/2022   Alcohol induced acute pancreatitis 04/11/2022   Hypertensive urgency 04/11/2022   Pancreatic cyst 12/03/2021   Alcohol use disorder, moderate, dependence (HCC) 12/03/2021   History of DVT (deep vein thrombosis) 12/03/2021   Chronic anticoagulation 12/03/2021   Alcohol use disorder in remission 12/03/2021   Elevated blood-pressure reading, without diagnosis of hypertension 12/03/2021   COVID-19 virus infection 12/03/2021   Bilateral pleural effusion 01/09/2021   Malnutrition of moderate degree 01/03/2021   Acute pancreatitis 12/29/2020   Nicotine dependence 12/29/2020   RLS (restless legs syndrome) 09/12/2020   Pressure injury of skin 05/28/2020   Protein-calorie malnutrition, severe 05/22/2020   Hypotension    Abdominal ascites    Acute metabolic encephalopathy 05/10/2020   Severe malnutrition (HCC) 05/10/2020   Sinus tachycardia 05/10/2020   Delirium tremens (HCC) 05/03/2020   Hyponatremia 05/03/2020   Hypokalemia 05/03/2020   AKI (acute kidney injury) (HCC) 05/03/2020   Elevated CK 05/03/2020   Heme positive stool 05/03/2020   Thrombocytopenia (HCC) 05/03/2020    Past Surgical History: No past surgical history on file.  Allergies: Allergies  Allergen Reactions   Oxycodone Rash    Rash/itching which required benadryl to resolve   Sudafed [Pseudoephedrine]     History per pt of  this being linked with his seizures    Home Medications: Medications Prior to Admission  Medication Sig Dispense Refill Last Dose   acetaminophen (TYLENOL) 500 MG tablet Take 500 mg by mouth every 6 (six)  hours as needed for headache, moderate pain or mild pain.      lipase/protease/amylase (CREON) 36000 UNITS CPEP capsule Take 2 capsules (72,000 Units total) by mouth 3 (three) times daily with meals. 180 capsule 3    losartan (COZAAR) 25 MG tablet Take 1 tablet (25 mg total) by mouth daily. 30 tablet 3    Home medication reconciliation was completed with the patient.   Scheduled Inpatient Medications:    lipase/protease/amylase  72,000 Units Oral TID WC    Continuous Inpatient Infusions:    sodium chloride 100 mL/hr at 06/24/23 0901   albumin human      PRN Inpatient Medications:  acetaminophen **OR** acetaminophen, ketorolac, labetalol, magnesium hydroxide, morphine injection, ondansetron **OR** ondansetron (ZOFRAN) IV, mouth rinse, traZODone  Family History: family history includes Alcohol abuse in his mother.   GI Family History: Positive for colon cancer in both parents who are now deceased due to the disease.  Social History:   reports that he has been smoking. He has never used smokeless tobacco. He reports that he does not currently use alcohol. He reports that he does not currently use drugs. The patient denies ETOH, tobacco, or drug use.    Review of Systems: Review of Systems - Negative except HPI  Physical Examination: BP 117/81 (BP Location: Right Arm)   Pulse (!) 102   Temp 97.8 F (36.6 C) (Oral)   Resp 16   Ht 6\' 1"  (1.854 m)   Wt 63.5 kg   SpO2 97%   BMI 18.47 kg/m  Physical Exam Constitutional:      General: He is not in acute distress.    Appearance: He is not ill-appearing or diaphoretic.  Eyes:     Extraocular Movements: Extraocular movements intact.  Cardiovascular:     Rate and Rhythm: Normal rate.     Heart sounds: No murmur heard.    No friction rub. Gallop present.  Pulmonary:     Effort: Pulmonary effort is normal.     Breath sounds: Normal breath sounds.  Abdominal:     General: Bowel sounds are normal.     Palpations: Abdomen is  soft.     Tenderness: There is generalized abdominal tenderness. There is no guarding or rebound. Negative signs include Murphy's sign.  Skin:    General: Skin is warm.  Neurological:     General: No focal deficit present.     Mental Status: He is alert.  Psychiatric:        Mood and Affect: Mood normal. Mood is not anxious or depressed.        Behavior: Behavior normal.     Data: Lab Results  Component Value Date   WBC 11.8 (H) 06/24/2023   HGB 13.9 06/24/2023   HCT 39.8 06/24/2023   MCV 84.1 06/24/2023   PLT 253 06/24/2023   Recent Labs  Lab 06/23/23 1801 06/24/23 0519  HGB 16.5 13.9   Lab Results  Component Value Date   NA 128 (L) 06/24/2023   K 4.4 06/24/2023   CL 95 (L) 06/24/2023   CO2 24 06/24/2023   BUN 14 06/24/2023   CREATININE 0.88 06/24/2023   Lab Results  Component Value Date   ALT 6 06/24/2023   AST 9 (L) 06/24/2023   ALKPHOS  34 (L) 06/24/2023   BILITOT 1.0 06/24/2023   Recent Labs  Lab 06/23/23 1856  APTT 30  INR 1.1      Latest Ref Rng & Units 06/24/2023    5:19 AM 06/23/2023    6:01 PM 12/27/2022    3:28 AM  CBC  WBC 4.0 - 10.5 K/uL 11.8  14.5  4.6   Hemoglobin 13.0 - 17.0 g/dL 47.8  29.5  62.1   Hematocrit 39.0 - 52.0 % 39.8  49.1  33.1   Platelets 150 - 400 K/uL 253  301  311     STUDIES: CT ABDOMEN PELVIS W CONTRAST  Result Date: 06/23/2023 CLINICAL DATA:  Abdominal pain, nausea/vomiting EXAM: CT ABDOMEN AND PELVIS WITH CONTRAST TECHNIQUE: Multidetector CT imaging of the abdomen and pelvis was performed using the standard protocol following bolus administration of intravenous contrast. RADIATION DOSE REDUCTION: This exam was performed according to the departmental dose-optimization program which includes automated exposure control, adjustment of the mA and/or kV according to patient size and/or use of iterative reconstruction technique. CONTRAST:  OMNIPAQUE IOHEXOL 300 MG/ML  SOLN COMPARISON:  MRI abdomen dated 02/23/2023. CT  abdomen/pelvis dated 12/23/2022. FINDINGS: Lower chest: Lung bases are clear. Hepatobiliary: Liver is within normal limits. Gallbladder is unremarkable. No intrahepatic or extrahepatic duct dilatation. Pancreas: 17 mm pseudocyst in the pancreatic uncinate process, improved from prior CT but mildly progressive from MR. Additional 8 mm pseudocyst along the pancreatic tail (series 2/image 23), mildly progressive. Very mild peripancreatic fluid/inflammatory changes along the pancreatic head/uncinate process. No drainable walled-off fluid collection. Spleen: Within normal limits. Adrenals/Urinary Tract: Adrenal glands are within normal limits. Kidneys are within normal limits.  No hydronephrosis. Bladder is within normal limits. Stomach/Bowel: Stomach is within normal limits. Mildly prominent but nondilated loops of small bowel in the central abdomen. No evidence of bowel obstruction. Normal appendix (series 2/image 86). No colonic wall thickening or inflammatory changes. Vascular/Lymphatic: No evidence of abdominal aortic aneurysm. Chronic splenic vein occlusion. No suspicious abdominopelvic lymphadenopathy. Reproductive: Prostate is unremarkable. Other: Moderate abdominopelvic ascites, progressive. Musculoskeletal: Visualized osseous structures are within normal limits. IMPRESSION: Possible mild acute pancreatitis, equivocal. Correlate with laboratory evaluation. Associated small pseudocysts measuring up to 17 mm in the uncinate process, mildly progressive. Moderate abdominopelvic ascites, progressive. No drainable walled-off fluid collection. Electronically Signed   By: Charline Bills M.D.   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 Abdominal paracentesis with Fluid studies ordered and are pending. Pancreatic pseudocyst, small, chronic. Acute pancreatitis -Mild with improving WBC. Ascites, fluid studies  pending. Hyponatremia    Recommendations: Continue npo for now, IV fluids, IV anagesia. Continue IV pain control, taper as feasible. Await paracentesis results. Agree pseudocyst is chronic and not likely related to current situation. Will follow along  Thank you for the consult. Please call with questions or concerns.  Rosina Lowenstein, "Mellody Dance MD [facility] [address] [place], [place] [address] [phone]  06/24/2023 2:48 PM

## 2023-06-25 ENCOUNTER — Inpatient Hospital Stay: Payer: Medicaid Other

## 2023-06-25 ENCOUNTER — Other Ambulatory Visit: Payer: Self-pay

## 2023-06-25 DIAGNOSIS — K863 Pseudocyst of pancreas: Secondary | ICD-10-CM | POA: Diagnosis not present

## 2023-06-25 DIAGNOSIS — K859 Acute pancreatitis without necrosis or infection, unspecified: Secondary | ICD-10-CM | POA: Diagnosis not present

## 2023-06-25 LAB — CBC
HCT: 36.2 % — ABNORMAL LOW (ref 39.0–52.0)
Hemoglobin: 12.7 g/dL — ABNORMAL LOW (ref 13.0–17.0)
MCH: 29.6 pg (ref 26.0–34.0)
MCHC: 35.1 g/dL (ref 30.0–36.0)
MCV: 84.4 fL (ref 80.0–100.0)
Platelets: 243 10*3/uL (ref 150–400)
RBC: 4.29 MIL/uL (ref 4.22–5.81)
RDW: 13.6 % (ref 11.5–15.5)
WBC: 10.8 10*3/uL — ABNORMAL HIGH (ref 4.0–10.5)
nRBC: 0 % (ref 0.0–0.2)

## 2023-06-25 LAB — COMPREHENSIVE METABOLIC PANEL
ALT: 6 U/L (ref 0–44)
AST: 12 U/L — ABNORMAL LOW (ref 15–41)
Albumin: 2.1 g/dL — ABNORMAL LOW (ref 3.5–5.0)
Alkaline Phosphatase: 26 U/L — ABNORMAL LOW (ref 38–126)
Anion gap: 8 (ref 5–15)
BUN: 10 mg/dL (ref 6–20)
CO2: 21 mmol/L — ABNORMAL LOW (ref 22–32)
Calcium: 7.5 mg/dL — ABNORMAL LOW (ref 8.9–10.3)
Chloride: 96 mmol/L — ABNORMAL LOW (ref 98–111)
Creatinine, Ser: 0.7 mg/dL (ref 0.61–1.24)
GFR, Estimated: >60 mL/min (ref >60)
Glucose, Bld: 107 mg/dL — ABNORMAL HIGH (ref 70–99)
Potassium: 4.1 mmol/L (ref 3.5–5.1)
Sodium: 125 mmol/L — ABNORMAL LOW (ref 135–145)
Total Bilirubin: 0.8 mg/dL (ref 0.3–1.2)
Total Protein: 4.4 g/dL — ABNORMAL LOW (ref 6.5–8.1)

## 2023-06-25 LAB — PROTIME-INR
INR: 1.2 (ref 0.8–1.2)
Prothrombin Time: 15.2 s (ref 11.4–15.2)

## 2023-06-25 LAB — LIPASE, BLOOD: Lipase: 299 U/L — ABNORMAL HIGH (ref 11–51)

## 2023-06-25 MED ORDER — CHLORHEXIDINE GLUCONATE CLOTH 2 % EX PADS
6.0000 | MEDICATED_PAD | Freq: Every day | CUTANEOUS | Status: DC
  Administered 2023-06-25 – 2023-07-05 (×11): 6 via TOPICAL

## 2023-06-25 MED ORDER — GADOBUTROL 1 MMOL/ML IV SOLN
6.0000 mL | Freq: Once | INTRAVENOUS | Status: AC | PRN
  Administered 2023-06-25: 6 mL via INTRAVENOUS

## 2023-06-25 MED ORDER — ALBUMIN HUMAN 25 % IV SOLN
25.0000 g | Freq: Once | INTRAVENOUS | Status: AC
  Administered 2023-06-25: 25 g via INTRAVENOUS
  Filled 2023-06-25: qty 100

## 2023-06-25 MED ORDER — ENOXAPARIN SODIUM 40 MG/0.4ML IJ SOSY
40.0000 mg | PREFILLED_SYRINGE | INTRAMUSCULAR | Status: DC
  Administered 2023-06-25 – 2023-07-05 (×11): 40 mg via SUBCUTANEOUS
  Filled 2023-06-25 (×11): qty 0.4

## 2023-06-25 MED ORDER — SODIUM CHLORIDE 0.9% FLUSH
10.0000 mL | Freq: Two times a day (BID) | INTRAVENOUS | Status: DC
  Administered 2023-06-25: 20 mL
  Administered 2023-06-26 – 2023-07-05 (×17): 10 mL

## 2023-06-25 MED ORDER — OCTREOTIDE ACETATE 100 MCG/ML IJ SOLN
100.0000 ug | Freq: Three times a day (TID) | INTRAMUSCULAR | Status: DC
  Administered 2023-06-25 – 2023-07-05 (×30): 100 ug via SUBCUTANEOUS
  Filled 2023-06-25 (×32): qty 1

## 2023-06-25 MED ORDER — SODIUM CHLORIDE 0.9 % IV SOLN
2.0000 g | INTRAVENOUS | Status: AC
  Administered 2023-06-25 – 2023-07-01 (×7): 2 g via INTRAVENOUS
  Filled 2023-06-25 (×7): qty 20

## 2023-06-25 MED ORDER — TRAVASOL 10 % IV SOLN
INTRAVENOUS | Status: AC
  Filled 2023-06-25: qty 546

## 2023-06-25 MED ORDER — SODIUM CHLORIDE 0.9% FLUSH: 10.0000 mL | INTRAVENOUS | Status: DC | PRN

## 2023-06-25 NOTE — Consult Note (Signed)
PHARMACY - TOTAL PARENTERAL NUTRITION CONSULT NOTE   Indication:  Pancreatitis ascites with abscess. NPO  Patient Measurements: Height: 6\' 1"  (185.4 cm) Weight: 63.5 kg (140 lb) IBW/kg (Calculated) : 79.9 TPN AdjBW (KG): 63.5 Body mass index is 18.47 kg/m. Usual Weight: 63.5  Assessment:  Shane Jenkins is a 37 yo males with recurrent alcohol pancreatitis with a hx of alcohol abuse (reported last drink 6 months ago). Patient has a past medical history of pancreatic pseudocysts (stable, small, chronic) and hyponatremia. Pharmacy was consulted by gastro to start TPN on this patient given their complicated diagnosis - pancreatic ascites.   Glucose / Insulin: None Electrolytes: 8/15: WNL Renal: SCr Stable at baseline (0.7) Hepatic: WNL, Lipase 299 Intake / Output; MIVF: NS@100  GI Imaging: 8/15: moderate volume ascites and mesenteric edema.  GI Surgeries / Procedures: 8/14 Paracentesis with 4.2 L of fluid removed   Central access: None TPN start date: 8/15  Nutritional Goals: Goal TPN rate is 35 mL/hr (provides 65 g of protein and 1210 kcals per day)  RD Assessment: Estimated Needs Total Energy Estimated Needs: 2100-2400kcal/day Total Protein Estimated Needs: 105-120g/day Total Fluid Estimated Needs: 1.9-2.2L/day  Current Nutrition:  Clear liquids to be transition to NPO today (8/15)  Plan:  Start TPN at 35 mL/hr at 1800 Electrolytes in TPN: Na 47mEq/L, K 22mEq/L, Ca 77mEq/L, Mg 96mEq/L, and Phos 37mmol/L. Cl:Ac 1:1 Add standard MVI and trace elements to TPN Initiate Sensitive q8h SSI and adjust as needed  Reduce MIVF to 100 mL/hr at 1800 Monitor TPN labs daily until clinically indicated to monitor only Monday & Thursday.   8/15 Monitor Na for subsequent increase in total mEq/L for future orders due to history of hyponatremia.   Effie Shy PGY1 Pharmacy Resident 06/25/2023,12:05 PM

## 2023-06-25 NOTE — Progress Notes (Signed)
D/w advanced endoscopist at [facility] Dr Meridee Score and he wont be available to place a potential stent.  From General surgery perspective not much I can offer. GI following. We will be available.

## 2023-06-25 NOTE — Progress Notes (Signed)
Peripherally Inserted Central Catheter Placement  The IV Nurse has discussed with the patient and/or persons authorized to consent for the patient, the purpose of this procedure and the potential benefits and risks involved with this procedure.  The benefits include less needle sticks, lab draws from the catheter, and the patient may be discharged home with the catheter. Risks include, but not limited to, infection, bleeding, blood clot (thrombus formation), and puncture of an artery; nerve damage and irregular heartbeat and possibility to perform a PICC exchange if needed/ordered by physician.  Alternatives to this procedure were also discussed.  Bard Power PICC patient education guide, fact sheet on infection prevention and patient information card has been provided to patient /or left at bedside.    PICC Placement Documentation  PICC Double Lumen 06/25/23 Right Basilic 39 cm 2 cm (Active)  Indication for Insertion or Continuance of Line Administration of hyperosmolar/irritating solutions (i.e. TPN, Vancomycin, etc.) 06/25/23 1431  Exposed Catheter (cm) 2 cm 06/25/23 1431  Site Assessment Clean, Dry, Intact 06/25/23 1431  Lumen #1 Status Flushed;Blood return noted 06/25/23 1431  Lumen #2 Status Flushed;Blood return noted;Saline locked 06/25/23 1431  Dressing Type Transparent 06/25/23 1431  Dressing Status Antimicrobial disc in place 06/25/23 1431  Line Care Connections checked and tightened 06/25/23 1431  Line Adjustment (NICU/IV Team Only) No 06/25/23 1431  Dressing Intervention New dressing 06/25/23 1431  Dressing Change Due 07/02/23 06/25/23 1431       Audrie Gallus 06/25/2023, 2:32 PM

## 2023-06-25 NOTE — Progress Notes (Addendum)
GI Inpatient Follow-up Note  Patient Identification: Shane Kindschi Sr. is a 36 y.o. male admitted for abdominal pain on 06/23/23. PMHx of recurrent alcohol pancreatitis and hx of alcohol abuse (last drink 6 months ago per report)  Subjective: overall feeling improved since admission. Minimal abd pain overnight but did have some abd pain during/after MRI completed this morning. Denies any nausea/vomiting. Tolerated a small amount of clears breakfast tray without pain.   Scheduled Inpatient Medications:   enoxaparin (LOVENOX) injection  40 mg Subcutaneous Q24H   lipase/protease/amylase  72,000 Units Oral TID WC    Continuous Inpatient Infusions:    sodium chloride 100 mL/hr at 06/25/23 0824    PRN Inpatient Medications:  acetaminophen **OR** acetaminophen, ketorolac, labetalol, magnesium hydroxide, morphine injection, ondansetron **OR** ondansetron (ZOFRAN) IV, mouth rinse, traZODone  Review of Systems: Constitutional: Weight is stable.  Eyes: No changes in vision. ENT: No oral lesions, sore throat.  GI: see HPI.  Heme/Lymph: No easy bruising.  CV: No chest pain.  GU: No hematuria.  Integumentary: No rashes.  Neuro: No headaches.  Psych: No depression/anxiety.  Endocrine: No heat/cold intolerance.  Allergic/Immunologic: No urticaria.  Resp: No cough, SOB.  Musculoskeletal: No joint swelling.    Physical Examination: BP 123/76 (BP Location: Left Arm)   Pulse 97   Temp 98 F (36.7 C) (Oral)   Resp 20   Ht 6\' 1"  (1.854 m)   Wt 63.5 kg   SpO2 100%   BMI 18.47 kg/m  Gen: NAD, alert and oriented x 4, resting in bed HEENT: PEERLA, EOMI, Neck: supple, no JVD or thyromegaly Chest: CTA bilaterally, no wheezes, crackles, or other adventitious sounds CV: RRR, no m/g/c/r Abd: soft, non distended, TTP diffusely without rebound guarding.  Ext: no edema, well perfused with 2+ pulses, Skin: no rash or lesions noted Lymph: no LAD  Data: Lab Results  Component Value Date   WBC  10.8 (H) 06/25/2023   HGB 12.7 (L) 06/25/2023   HCT 36.2 (L) 06/25/2023   MCV 84.4 06/25/2023   PLT 243 06/25/2023   Recent Labs  Lab 06/23/23 1801 06/24/23 0519 06/25/23 0433  HGB 16.5 13.9 12.7*   Lab Results  Component Value Date   NA 125 (L) 06/25/2023   K 4.1 06/25/2023   CL 96 (L) 06/25/2023   CO2 21 (L) 06/25/2023   BUN 10 06/25/2023   CREATININE 0.70 06/25/2023   Lab Results  Component Value Date   ALT 6 06/25/2023   AST 12 (L) 06/25/2023   ALKPHOS 26 (L) 06/25/2023   BILITOT 0.8 06/25/2023   Recent Labs  Lab 06/23/23 1856 06/25/23 0433  APTT 30  --   INR 1.1 1.2    IMPRESSION: MRCP 06/25/23-- 1. Mild caudate lobe enlargement without specific evidence of cirrhosis. 2. Findings of chronic pancreatitis, with intraparenchymal pseudocysts as detailed above. 3. Moderate volume ascites and mesenteric edema. This makes evaluation for acute pancreatitis challenging. Suspected based on recent elevated lipase. 4. Jejunal wall thickening may represent enteritis or be secondary to decreased albumin.    CT a/p 8/13-IMPRESSION: Possible mild acute pancreatitis, equivocal. Correlate with laboratory evaluation.  Associated small pseudocysts measuring up to 17 mm in the uncinate process, mildly progressive.  Moderate abdominopelvic ascites, progressive. No drainable walled-off fluid collection.  Assessment/Plan: Mr. Shane Jenkins is a 37 y.o. male admitted for abd pain and pancreatitis   Alcoholic pancreatitis w/ pancreatic ascites - he is s/p paracentesis w/ 4L removed on 06/24/23. Fluid studies show SBP (total nucleated count  1854 with 40% neutrophils) and patient will be started on Rocephin 2g IV daily. He has received IV albumin this morning. Amylase >10,000 and low SAG. Given pancreatic ascites will transition to NPO and will consult for TPN. Complex case and Dr. Norma Jenkins to potentially discuss with tertiary center regarding pancreatic stenting.  Pancreatic pseudocyst - small,  chronic, stable. Surgery following Hyponatremia - Na 125    Recommendations: -Rocephin 2g IV daily  -NPO  -TPN consult  -Pain management per primary team  -Further recs pending     Case discussed with Dr. Norma Jenkins who agrees with above plan of care and will provide further recommendations.   Please call with questions or concerns.    Tawni Pummel, PA-C [facility]

## 2023-06-25 NOTE — Progress Notes (Addendum)
Progress Note   Patient: Shane Reeck Sr. ZOX:[id number] DOB: 1986-09-11 DOA: 06/23/2023     2 DOS: the patient was seen and examined on 06/25/2023     Subjective:  Patient seen and examined at bedside this morning Underwent paracentesis yesterday with removal of 4.2 L of ascitic fluid Analysis of ascitic fluid shows possible pancreatic source other than cirrhosis. Plan of care discussed with surgeon as well as gastroenterologist at this point with recommendation for TPN and octreotide. We have discussed the case with interventionist who recommended TPN and octreotide and if after some time there is no improvement then patient will likely need stent placement.   Brief hospital course: Shane Sharpley Sr. is a 37 y.o. Caucasian male with medical history significant for essential hypertension, alcohol abuse, currently sober for 6 months and alcoholic pancreatitis with pancreatic insufficiency, who presented to the emergency room with acute onset of worsening abdominal pain over the last week mainly in the epigastric area in the left upper quadrant, which significantly deteriorated today with associated nausea without vomiting.  He has been having constipation but is passing flatus.  No fever or chills.  No dysuria, oliguria or hematuria or flank pain.   ED Course: When he came to the ER, BP was 147/100 with respiratory to 24 with otherwise normal vital signs.  Labs revealed hyponatremia 124 and above chloremia of 88 with CO2 of 19 and glucose 124, calcium 8.6 and anion gap of 17.  Albumin was 2.8 and total bili 1.5.  Serum lipase came back elevated at 386.  Lactic acid was 2 and CBC showed leukocytosis of 14.5.  Coagulation profile was normal.  Respiratory panel including COVID-19 PCR came back negative.  Blood cultures were drawn.      EKG as reviewed by me : EKG showed sinus tachycardia with rate 150 with right atrial enlargement and minimal voltage criteria for LVH with peaked T waves and true lateral  leads and T wave inversion inferiorly.    Imaging: Abdominal and pelvic CT scan with contrast revealed the following: Possible mild acute pancreatitis, equivocal. Correlate with laboratory evaluation.   Associated small pseudocysts measuring up to 17 mm in the uncinate process, mildly progressive.   Moderate abdominopelvic ascites, progressive. No drainable walled-off fluid collection.     Assessment and Plan:    Acute pancreatitis with  ascites according to patient he has not used any alcohol within the last 6 months Continue current IV fluid We will keep n.p.o. Continue current pain regiment We appreciate the input of surgery as well as gastroenterology team CT scan of the abdomen showed I personally reviewed the patient's CT scan of the abdomen showed findings of acute pancreatitis as well as pancreatic pseudocyst Patient underwent paracentesis with 4.2 L of ascitic fluid removed on 06/24/2023 Analysis of ascitic fluid shows possible pancreatic source other than cirrhosis. Plan of care discussed with surgeon as well as gastroenterologist at this point with recommendation for TPN and octreotide. We have discussed the case with interventionist who recommended TPN and octreotide and if after some time there is no improvement then patient will likely need stent placement. We will keep patient n.p.o. at this time Patient will need transfer to tertiary facility [facility] or [facility]   Hypertensive urgency - This is partly related to pain. - Continue losartan - Continue as needed IV labetalol.   Hyponatremia - Likely secondary to hypovolemia in the setting of nausea and vomiting Continue monitoring Continue IV fluid therapy   Pancreatic insufficiency -  Continue his Creon.   Tobacco abuse - He was Counseled for smoking cessation and will receive further counseling here.     DVT prophylaxis: Lovenox.    Advanced Care Planning:  Code Status: Full code   Family  Communication: None present at bedside     Physical Exam: Middle-aged male in no acute distress EYES: Pupils equal, round, reactive to light and accommodation. No scleral icterus. Extraocular muscles intact.  HEENT: Head atraumatic, normocephalic. Oropharynx and nasopharynx clear.  NECK:  Supple, no jugular venous distention. No thyroid enlargement, no tenderness.  LUNGS: Normal breath sounds bilaterally, no wheezing, rales,rhonchi or crepitation. No use of accessory muscles of respiration.  CARDIOVASCULAR: Regular rate and rhythm, S1, S2 normal. No murmurs, rubs, or gallops.  ABDOMEN: Abdominal tenderness improved, distention improved EXTREMITIES: No pedal edema, cyanosis, or clubbing.  NEUROLOGIC: Cranial nerves II through XII are intact. Muscle strength 5/5 in all extremities. Sensation intact. Gait not checked.  PSYCHIATRIC: The patient is alert and oriented x 3.  Normal affect and good eye contact. SKIN: No obvious rash, lesion, or ulcer.     Data Reviewed: I have reviewed patient's lab results shown below as well as patient's MRI abdomen reviewed that did not show any evidence of liver cirrhosis but rather pseudocyst seen.     Latest Ref Rng & Units 06/25/2023    4:33 AM 06/24/2023    5:19 AM 06/23/2023    6:01 PM  CBC  WBC 4.0 - 10.5 K/uL 10.8  11.8  14.5   Hemoglobin 13.0 - 17.0 g/dL 62.9  52.8  41.3   Hematocrit 39.0 - 52.0 % 36.2  39.8  49.1   Platelets 150 - 400 K/uL 243  253  301        Latest Ref Rng & Units 06/25/2023    4:33 AM 06/24/2023    5:19 AM 06/23/2023    6:57 PM  BMP  Glucose 70 - 99 mg/dL 244  010    BUN 6 - 20 mg/dL 10  14  14    Creatinine 0.61 - 1.24 mg/dL 2.72  5.36  6.44   Sodium 135 - 145 mmol/L 125  128    Potassium 3.5 - 5.1 mmol/L 4.1  4.4    Chloride 98 - 111 mmol/L 96  95    CO2 22 - 32 mmol/L 21  24    Calcium 8.9 - 10.3 mg/dL 7.5  7.8       Family Communication: No family present at bedside       Vitals:   06/24/23 1922 06/24/23 2003  06/25/23 0027 06/25/23 0419  BP: 132/78 120/77 130/82 123/76  Pulse: (!) 103 (!) 104 (!) 102 97  Resp: 18 18 18 20   Temp: 98.2 F (36.8 C) 98.1 F (36.7 C) 98.2 F (36.8 C) 98 F (36.7 C)  TempSrc:  Oral Oral Oral  SpO2: 100% 100% 100% 100%  Weight:      Height:       Patient with high mortality risks given above condition  Author: Loyce Dys, MD 06/25/2023 2:25 PM  For on call review [email or web address].

## 2023-06-25 NOTE — Progress Notes (Signed)
[facility] SURGICAL PROGRESS NOTE (cpt [phone])  Hospital Day(s): 2.   Interval History: Patient seen and examined, no acute events or new complaints overnight. Patient reports he has somewhat increase in upper abdominal pain this morning after moving around for MRI, denies fever, chills, emesis. His leukocytosis is improving; now 10.8K (from 11.8K), Hgb to 12.7. Renal function normal; sCr - 0.70; UO - 150 ccs. Hyponatremia to 125. Hyperbilirubinemia resolved; 0.8 this AM. INR normal at 1.2. Lipase 299. He is on CLD. Plan for MRI Liver this morning.   Of note, he did undergo paracentesis and results available are as follows:   - Amylase: >10,000  - Protein: 3.4  - Albumin: 1.6 (SAAG 0.5)  - Lipase: Pending  - Bilirubin: Pending  - Cx; Pending   Review of Systems:  Constitutional: denies fever, chills  HEENT: denies cough or congestion  Respiratory: denies any shortness of breath  Cardiovascular: denies chest pain or palpitations  Gastrointestinal: + abdominal pain, denied N/V Musculoskeletal: denies pain, decreased motor or sensation  Vital signs in last 24 hours: [min-max] current  Temp:  [97.5 F (36.4 C)-98.2 F (36.8 C)] 98 F (36.7 C) (08/15 0419) Pulse Rate:  [97-113] 97 (08/15 0419) Resp:  [16-20] 20 (08/15 0419) BP: (117-132)/(76-93) 123/76 (08/15 0419) SpO2:  [97 %-100 %] 100 % (08/15 0419)     Height: 6\' 1"  (185.4 cm) Weight: 63.5 kg BMI (Calculated): 18.47   Intake/Output last 2 shifts:  08/14 0701 - 08/15 0700 In: 2862.1 [P.O.:840; I.V.:2022.1] Out: 150 [Urine:150]   Physical Exam:  Constitutional: alert, cooperative and no distress  HENT: normocephalic without obvious abnormality  Eyes: PERRL, EOM's grossly intact and symmetric  Respiratory: breathing non-labored at rest  Cardiovascular: regular rate and sinus rhythm  Gastrointestinal: soft, remains tender in epigastrium, distension improved some, no rebound/guarding Musculoskeletal: no edema  or wounds, motor and sensation grossly intact, NT    Labs:     Latest Ref Rng & Units 06/25/2023    4:33 AM 06/24/2023    5:19 AM 06/23/2023    6:01 PM  CBC  WBC 4.0 - 10.5 K/uL 10.8  11.8  14.5   Hemoglobin 13.0 - 17.0 g/dL 60.4  54.0  98.1   Hematocrit 39.0 - 52.0 % 36.2  39.8  49.1   Platelets 150 - 400 K/uL 243  253  301       Latest Ref Rng & Units 06/25/2023    4:33 AM 06/24/2023    5:19 AM 06/23/2023    6:57 PM  CMP  Glucose 70 - 99 mg/dL 191  478    BUN 6 - 20 mg/dL 10  14  14    Creatinine 0.61 - 1.24 mg/dL 2.95  6.21  3.08   Sodium 135 - 145 mmol/L 125  128    Potassium 3.5 - 5.1 mmol/L 4.1  4.4    Chloride 98 - 111 mmol/L 96  95    CO2 22 - 32 mmol/L 21  24    Calcium 8.9 - 10.3 mg/dL 7.5  7.8    Total Protein 6.5 - 8.1 g/dL 4.4  5.2    Total Bilirubin 0.3 - 1.2 mg/dL 0.8  1.0    Alkaline Phos 38 - 126 U/L 26  34    AST 15 - 41 U/L 12  9    ALT 0 - 44 U/L 6  6       Imaging studies:  Pending MRI Liver   Assessment/Plan: (ICD-10's: K86.3)  37 y.o. male with chronic pancreatitis and small pseudocyst also with significant ascites without significant evidence of cirrhosis concerning for possible pancreatic ascites, complicated by history of alcohol abuse.   - Agree with proceeding with MRI Liver. Question need for MRCP, or additional imaging studies, to access for any pancreatic duct disruption. Paracentesis revealed Amylase >10,000, protein 3.4, and SAAG 0.5 (serum albumin 2.1, Albumin in fluid 1.6) and without gross evidence of cirrhosis on imaging, this raises concerns for other etiology of his ascites. Will await read and discuss with radiology if any other imaging studies are indicated  - CLD is reasonable   - Nothing to do at this point from surgical perspective; he is Marjo Bicker B at minimum which carries about 30% mortality risk peri-operatively.    - Monitor abdominal examination             - Pain control prn; antiemetics prn             - Monitor leukocytosis;  improving - Mobilize as tolerated    All of the above findings and recommendations were discussed with the patient, and the medical team, and all of patient's questions were answered to his expressed satisfaction.  -- Lynden Oxford, PA-C [facility] 06/25/2023, 7:23 AM M-F: 7am - 4pm

## 2023-06-25 NOTE — Progress Notes (Signed)
Initial Nutrition Assessment  DOCUMENTATION CODES:   Non-severe (moderate) malnutrition in context of chronic illness  INTERVENTION:   TPN per pharmacy  Recommend thiamine and folic acid daily in TPN  Pt at high refeed risk; recommend monitor potassium, magnesium and phosphorus labs daily until stable  Daily weights  NUTRITION DIAGNOSIS:   Moderate Malnutrition related to chronic illness (pancreatitis, suspected EPI, etoh abuse) as evidenced by mild fat depletion, moderate muscle depletion.  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  -Pt s/p paracentesis 8/14 with 4.0L output   Met with pt in room today. Pt reports decreased appetite and oral intake for one week pta r/t nausea and vomiting. Pt reports that he has been having some liquids and broth in hospital. Pt with abdominal distension today secondary to ascites. Pt made NPO. Plan today is for PICC line and TPN. RD discussed with the medical team the possibility of placing post pyloric NGT and providing enteral nutrition versus TPN. Medical team feels that patient should remain NPO and receive TPN at this time. Pt is at high refeed risk.   Per chart, pt is down 5lbs(3%) from his UBW of 145lbs.   Medications reviewed and include: lovenox, creon, octreotide, NaCl @100ml /hr, ceftriaxone  Labs reviewed: Na 125(L), K 4.1 wnl, lipase 299(H) Wbc- 10.8(H)  NUTRITION - FOCUSED PHYSICAL EXAM:  Flowsheet Row Most Recent Value  Orbital Region No depletion  Upper Arm Region Mild depletion  Thoracic and Lumbar Region Mild depletion  Buccal Region No depletion  Temple  Region No depletion  Clavicle Bone Region Moderate depletion  Clavicle and Acromion Bone Region Moderate depletion  Scapular Bone Region No depletion  Dorsal Hand No depletion  Patellar Region Moderate depletion  Anterior Thigh Region Moderate depletion  Posterior Calf Region Moderate depletion  Edema (RD Assessment) None  Hair Reviewed  Eyes Reviewed  Mouth Reviewed  Skin Reviewed  Nails Reviewed   Diet Order:   Diet Order             Diet NPO time specified  Diet effective now                  EDUCATION NEEDS:   Education needs have been addressed  Skin:  Skin Assessment: Reviewed RN Assessment  Last BM:  8/10  Height:   Ht Readings from Last 1 Encounters:  06/23/23 6\' 1"  (1.854 m)    Weight:   Wt Readings from Last 1 Encounters:  06/23/23 63.5 kg    Ideal Body Weight:  83.6 kg  BMI:  Body mass index is 18.47 kg/m.  Estimated Nutritional Needs:   Kcal:  2100-2400kcal/day  Protein:  105-120g/day  Fluid:  1.9-2.2L/day  Betsey Holiday MS, RD, LDN Please refer to [facility] for RD and/or RD on-call/weekend/after hours pager

## 2023-06-26 DIAGNOSIS — K859 Acute pancreatitis without necrosis or infection, unspecified: Secondary | ICD-10-CM | POA: Diagnosis not present

## 2023-06-26 LAB — COMPREHENSIVE METABOLIC PANEL
ALT: 7 U/L (ref 0–44)
AST: 12 U/L — ABNORMAL LOW (ref 15–41)
Albumin: 2 g/dL — ABNORMAL LOW (ref 3.5–5.0)
Alkaline Phosphatase: 25 U/L — ABNORMAL LOW (ref 38–126)
Anion gap: 6 (ref 5–15)
BUN: 11 mg/dL (ref 6–20)
CO2: 22 mmol/L (ref 22–32)
Calcium: 7.6 mg/dL — ABNORMAL LOW (ref 8.9–10.3)
Chloride: 103 mmol/L (ref 98–111)
Creatinine, Ser: 0.58 mg/dL — ABNORMAL LOW (ref 0.61–1.24)
GFR, Estimated: >60 mL/min (ref >60)
Glucose, Bld: 118 mg/dL — ABNORMAL HIGH (ref 70–99)
Potassium: 4.1 mmol/L (ref 3.5–5.1)
Sodium: 131 mmol/L — ABNORMAL LOW (ref 135–145)
Total Bilirubin: 0.2 mg/dL — ABNORMAL LOW (ref 0.3–1.2)
Total Protein: 4.3 g/dL — ABNORMAL LOW (ref 6.5–8.1)

## 2023-06-26 LAB — CBC
HCT: 31.2 % — ABNORMAL LOW (ref 39.0–52.0)
Hemoglobin: 10.6 g/dL — ABNORMAL LOW (ref 13.0–17.0)
MCH: 29.7 pg (ref 26.0–34.0)
MCHC: 34 g/dL (ref 30.0–36.0)
MCV: 87.4 fL (ref 80.0–100.0)
Platelets: 235 10*3/uL (ref 150–400)
RBC: 3.57 MIL/uL — ABNORMAL LOW (ref 4.22–5.81)
RDW: 13.7 % (ref 11.5–15.5)
WBC: 7.6 10*3/uL (ref 4.0–10.5)
nRBC: 0 % (ref 0.0–0.2)

## 2023-06-26 LAB — GLUCOSE, CAPILLARY
Glucose-Capillary: 81 mg/dL (ref 70–99)
Glucose-Capillary: 143 mg/dL — ABNORMAL HIGH (ref 70–99)

## 2023-06-26 LAB — ACID FAST SMEAR (AFB, MYCOBACTERIA): Acid Fast Smear: NEGATIVE

## 2023-06-26 LAB — LIPASE, BLOOD: Lipase: 522 U/L — ABNORMAL HIGH (ref 11–51)

## 2023-06-26 LAB — MAGNESIUM: Magnesium: 2 mg/dL (ref 1.7–2.4)

## 2023-06-26 LAB — PHOSPHORUS: Phosphorus: 2.7 mg/dL (ref 2.5–4.6)

## 2023-06-26 MED ORDER — TRACE MINERALS CU-MN-SE-ZN 300-55-60-3000 MCG/ML IV SOLN
INTRAVENOUS | Status: AC
  Filled 2023-06-26: qty 744

## 2023-06-26 MED ORDER — THIAMINE HCL 100 MG/ML IJ SOLN
100.0000 mg | Freq: Once | INTRAMUSCULAR | Status: AC
  Administered 2023-06-26: 100 mg via INTRAVENOUS
  Filled 2023-06-26: qty 2

## 2023-06-26 MED ORDER — BISACODYL 5 MG PO TBEC
10.0000 mg | DELAYED_RELEASE_TABLET | Freq: Every day | ORAL | Status: DC
  Filled 2023-06-26 (×6): qty 2

## 2023-06-26 MED ORDER — INSULIN ASPART 100 UNIT/ML IJ SOLN
0.0000 [IU] | Freq: Three times a day (TID) | INTRAMUSCULAR | Status: DC
  Administered 2023-06-26 – 2023-06-28 (×3): 1 [IU] via SUBCUTANEOUS
  Filled 2023-06-26 (×3): qty 1

## 2023-06-26 MED ORDER — THIAMINE HCL 100 MG/ML IJ SOLN
100.0000 mg | INTRAMUSCULAR | Status: AC
  Administered 2023-06-27 – 2023-07-01 (×5): 100 mg via INTRAVENOUS
  Filled 2023-06-26 (×5): qty 2

## 2023-06-26 MED ORDER — POLYETHYLENE GLYCOL 3350 17 G PO PACK
17.0000 g | PACK | Freq: Two times a day (BID) | ORAL | Status: DC
  Administered 2023-06-26 – 2023-07-05 (×7): 17 g via ORAL
  Filled 2023-06-26 (×15): qty 1

## 2023-06-26 NOTE — Progress Notes (Signed)
[facility] Gastroenterology Inpatient Progress Note    Subjective: Patient seen for f/u pancreatic ascites. Patient still in mild to moderate pain. Passing flatus, no bm in several days.  Objective: Vital signs in last 24 hours: Temp:  [98.5 F (36.9 C)-99 F (37.2 C)] 98.5 F (36.9 C) (08/16 0859) Pulse Rate:  [92-95] 95 (08/16 0859) Resp:  [16-19] 16 (08/16 0859) BP: (123-134)/(80-83) 134/83 (08/16 0859) SpO2:  [97 %-100 %] 100 % (08/16 0859) Blood pressure 134/83, pulse 95, temperature 98.5 F (36.9 C), resp. rate 16, height 6\' 1"  (1.854 m), weight 63.5 kg, SpO2 100%.    Intake/Output from previous day: 08/15 0701 - 08/16 0700 In: 2384.9 [I.V.:2384.9] Out: 200 [Urine:200]  Intake/Output this shift: Total I/O In: 1125.4 [I.V.:922; IV Piggyback:203.4] Out: 150 [Urine:150]   Gen: NAD. Appears comfortable.  HEENT: [place]/AT. PERRLA. Normal external ear exam.  Chest: CTA, no wheezes.  CV: RR nl S1, S2. No gallops.  Abd: soft, firm, diffusely tender, mildly distended. BS+. No guarding or rebound.  Ext: no edema. Pulses 2+  Neuro: Alert and oriented. Judgement appears normal. Nonfocal.   Lab Results: Results for orders placed or performed during the hospital encounter of 06/23/23 (from the past 24 hour(s))  CBC     Status: Abnormal   Collection Time: 06/26/23  9:38 AM  Result Value Ref Range   WBC 7.6 4.0 - 10.5 K/uL   RBC 3.57 (L) 4.22 - 5.81 MIL/uL   Hemoglobin 10.6 (L) 13.0 - 17.0 g/dL   HCT 08.6 (L) 57.8 - 46.9 %   MCV 87.4 80.0 - 100.0 fL   MCH 29.7 26.0 - 34.0 pg   MCHC 34.0 30.0 - 36.0 g/dL   RDW 62.9 52.8 - 41.3 %   Platelets 235 150 - 400 K/uL   nRBC 0.0 0.0 - 0.2 %  Comprehensive metabolic panel     Status: Abnormal   Collection Time: 06/26/23  9:38 AM  Result Value Ref Range   Sodium 131 (L) 135 - 145 mmol/L   Potassium 4.1 3.5 - 5.1 mmol/L   Chloride 103 98 - 111 mmol/L   CO2 22 22 - 32 mmol/L   Glucose, Bld 118 (H) 70 - 99 mg/dL   BUN 11  6 - 20 mg/dL   Creatinine, Ser 2.44 (L) 0.61 - 1.24 mg/dL   Calcium 7.6 (L) 8.9 - 10.3 mg/dL   Total Protein 4.3 (L) 6.5 - 8.1 g/dL   Albumin 2.0 (L) 3.5 - 5.0 g/dL   AST 12 (L) 15 - 41 U/L   ALT 7 0 - 44 U/L   Alkaline Phosphatase 25 (L) 38 - 126 U/L   Total Bilirubin 0.2 (L) 0.3 - 1.2 mg/dL   GFR, Estimated >01 >02 mL/min   Anion gap 6 5 - 15  Magnesium     Status: None   Collection Time: 06/26/23  9:38 AM  Result Value Ref Range   Magnesium 2.0 1.7 - 2.4 mg/dL  Phosphorus     Status: None   Collection Time: 06/26/23  9:38 AM  Result Value Ref Range   Phosphorus 2.7 2.5 - 4.6 mg/dL  Lipase, blood     Status: Abnormal   Collection Time: 06/26/23  9:58 AM  Result Value Ref Range   Lipase 522 (H) 11 - 51 U/L     Recent Labs    06/24/23 0519 06/25/23 0433 06/26/23 0938  WBC 11.8* 10.8* 7.6  HGB 13.9 12.7* 10.6*  HCT 39.8 36.2* 31.2*  PLT 253 243 235   BMET Recent Labs    06/24/23 0519 06/25/23 0433 06/26/23 0938  NA 128* 125* 131*  K 4.4 4.1 4.1  CL 95* 96* 103  CO2 24 21* 22  GLUCOSE 106* 107* 118*  BUN 14 10 11   CREATININE 0.88 0.70 0.58*  CALCIUM 7.8* 7.5* 7.6*   LFT Recent Labs    06/26/23 0938  PROT 4.3*  ALBUMIN 2.0*  AST 12*  ALT 7  ALKPHOS 25*  BILITOT 0.2*   PT/INR Recent Labs    06/23/23 1856 06/25/23 0433  LABPROT 14.7 15.2  INR 1.1 1.2   Hepatitis Panel No results for input(s): "HEPBSAG", "HCVAB", "HEPAIGM", "HEPBIGM" in the last 72 hours. C-Diff No results for input(s): "CDIFFTOX" in the last 72 hours. No results for input(s): "CDIFFPCR" in the last 72 hours.   Studies/Results: [place] EKG SITE RITE  Result Date: 06/25/2023 If Site Rite image not attached, placement could not be confirmed due to current cardiac rhythm.  MR LIVER W WO CONTRAST  Result Date: 06/25/2023 CLINICAL DATA:  Cirrhosis.  Pancreatic cyst. EXAM: MRI ABDOMEN WITHOUT AND WITH CONTRAST TECHNIQUE: Multiplanar multisequence MR imaging of the abdomen was  performed both before and after the administration of intravenous contrast. CONTRAST:  6mL GADAVIST GADOBUTROL 1 MMOL/ML IV SOLN COMPARISON:  06/23/2023 CT.  02/23/2023 MRI FINDINGS: Lower chest: Normal heart size without pericardial or pleural effusion. Hepatobiliary: The caudate is mildly prominent but there is no evidence of cirrhosis. No gallstones or acute cholecystitis. The common duct is upper normal to minimally dilated for age at 8 mm on 18/3. No obstructive stone or mass. Pancreas: Sequelae of chronic pancreatitis, including mild duct dilatation, irregularity, and side branch duct ectasia. Acute pancreatitis is difficult to evaluate/exclude, given the diffuse ascites and mesenteric edema. Pancreatic uncinate process nonenhancing complex cystic lesion of 1.3 cm on 23/4 is again favored to represent a pseudocyst. This does demonstrate complexity in its dependent portion, including on 25/12 and 37/15. Smaller cystic foci within the pancreatic tail and body including at up to 6 mm on 38/21 are also likely pseudocysts. Spleen:  Normal in size, without focal abnormality. Adrenals/Urinary Tract: Normal adrenal glands. Normal kidneys, without hydronephrosis. Stomach/Bowel: Proximal gastric underdistention. Thickening of jejunal loops including on 67/21 is mild to moderate. No bowel obstruction. Normal colon. Vascular/Lymphatic: Aortic atherosclerosis. Patent portal vein. The splenic vein is patent but diminutive. gastroepiploic collaterals including on 23/21, suggesting chronic splenic vein insufficiency. Prominent abdominal retroperitoneal and peripancreatic nodes are likely reactive. There is mild adenopathy within the jejunal mesentery, similar back to 05/04/2020 and likely reactive. Example 8 mm on 64/26. Other:  Moderate volume ascites. Musculoskeletal: No acute osseous abnormality. IMPRESSION: 1. Mild caudate lobe enlargement without specific evidence of cirrhosis. 2. Findings of chronic pancreatitis, with  intraparenchymal pseudocysts as detailed above. 3. Moderate volume ascites and mesenteric edema. This makes evaluation for acute pancreatitis challenging. Suspected based on recent elevated lipase. 4. Jejunal wall thickening may represent enteritis or be secondary to decreased albumin. Electronically Signed   By: Jeronimo Greaves M.D.   On: 06/25/2023 09:22   US Paracentesis  Result Date: 06/24/2023 INDICATION: Patient with a history of cirrhosis presents today with ascites. Interventional radiology asked to perform a diagnostic and therapeutic paracentesis. EXAM: ULTRASOUND GUIDED PARACENTESIS MEDICATIONS: 1% lidocaine 10 mL COMPLICATIONS: None immediate. PROCEDURE: Informed written consent was obtained from the patient after a discussion of the risks, benefits and alternatives to treatment. A timeout was performed prior to the initiation  of the procedure. Initial ultrasound scanning demonstrates a large amount of ascites within the right lower abdominal quadrant. The right lower abdomen was prepped and draped in the usual sterile fashion. 1% lidocaine was used for local anesthesia. Following this, a 19 gauge, 7-cm, Yueh catheter was introduced. An ultrasound image was saved for documentation purposes. The paracentesis was performed. The catheter was removed and a dressing was applied. The patient tolerated the procedure well without immediate post procedural complication. Patient received post-procedure intravenous albumin; see nursing notes for details. FINDINGS: A total of approximately 4.2 L of clear yellow fluid was removed. Samples were sent to the laboratory as requested by the clinical team. IMPRESSION: Successful ultrasound-guided paracentesis yielding 4.2 liters of peritoneal fluid. Procedure performed by Alwyn Ren NP PLAN: If the patient eventually requires >/=2 paracenteses in a 30 day period, candidacy for formal evaluation by the [facility]  will be assessed. Electronically Signed   By: Malachy Moan M.D.   On: 06/24/2023 16:29    Scheduled Inpatient Medications:    bisacodyl  10 mg Oral QHS   Chlorhexidine Gluconate Cloth  6 each Topical Daily   enoxaparin (LOVENOX) injection  40 mg Subcutaneous Q24H   insulin aspart  0-9 Units Subcutaneous Q8H   octreotide  100 mcg Subcutaneous TID   polyethylene glycol  17 g Oral BID   sodium chloride flush  10-40 mL Intracatheter Q12H   [START ON 06/27/2023] thiamine (VITAMIN B1) injection  100 mg Intravenous Q24H    Continuous Inpatient Infusions:    sodium chloride 50 mL/hr at 06/26/23 1539   cefTRIAXone (ROCEPHIN)  IV Stopped (06/26/23 1129)   TPN ADULT (ION) 35 mL/hr at 06/26/23 1539   TPN ADULT (ION)      PRN Inpatient Medications:  acetaminophen **OR** acetaminophen, ketorolac, labetalol, magnesium hydroxide, morphine injection, ondansetron **OR** ondansetron (ZOFRAN) IV, mouth rinse, sodium chloride flush  Miscellaneous: N/A  Assessment: 37 y/o male with history of alcohol abuse, abstinent for past 6 months, but having acute on chronic pancreatitis with pancreatic ascites. Some neutrocytic ascites as well requiring IV antibiotics. On IV octreotide tid.  Pancreatic ascites - S/P LVP with over 4 liters removed. Neutrocytic ascites. On IV rocephin Acute on chronic pancreatitis Hyponatremia -  Na improved with 131 today. Alcohol abuse - in remission.  Plan:  Oral laxative okay. Continue TPN, Iv octreotide. Consider transfer to tertiary care center in 3-4 days if little to no improvement or any worsening of ascites and pain. Dr. Tobi Bastos from [facility] will be covering service over weekend. Dr. Timothy Lasso or Mia Creek will assume care Monday as I will be out of town.  Demetri Goshert K. Norma Fredrickson, M.D. 06/26/2023, 3:58 PM

## 2023-06-26 NOTE — Consult Note (Addendum)
PHARMACY - TOTAL PARENTERAL NUTRITION CONSULT NOTE   Indication:  Pancreatitis ascites with abscess. NPO  Patient Measurements: Height: 6\' 1"  (185.4 cm) Weight: 63.5 kg (140 lb) IBW/kg (Calculated) : 79.9 TPN AdjBW (KG): 63.5 Body mass index is 18.47 kg/m. Usual Weight: 63.5  Assessment:  Shane Jenkins is a 37 yo males with recurrent alcohol pancreatitis with a hx of alcohol abuse (reported last drink 6 months ago). Patient has a past medical history of pancreatic pseudocysts (stable, small, chronic) and hyponatremia. Pharmacy was consulted by gastro to start TPN on this patient given their complicated diagnosis - pancreatic ascites.   Glucose / Insulin: None Electrolytes: 8/15: WNL Renal: SCr Stable at baseline (0.7) Hepatic: WNL, Lipase 299 >> 522 Intake / Output; MIVF: NS@100  GI Imaging: 8/15: moderate volume ascites and mesenteric edema.  GI Surgeries / Procedures: 8/14 Paracentesis with 4.2 L of fluid removed   Central access: None TPN start date: 8/15  Nutritional Goals: Goal TPN rate is 75 mL/hr (see components below)  RD Assessment: Estimated Needs Total Energy Estimated Needs: 2100-2400kcal/day Total Protein Estimated Needs: 105-120g/day Total Fluid Estimated Needs: 1.9-2.2L/day  Current Nutrition:  NPO  Plan:  Increased TPN to 75 mL/hr at 1800 from 35 mL/hr  Clinisol, 15% Dextrose, 19% Lipid, 38 g/L Protein, 111.6 g/day Kcal, 2200 kcal/day Electrolytes in TPN: Na 2mEq/L, K 80mEq/L, Ca 101mEq/L, Mg 11mEq/L, and Phos 81mmol/L. Cl:Ac 1:1 Add standard MVI and trace elements to TPN Decreased fluids to NS@50  Initiate Sensitive q8h SSI and adjust as needed  Monitor TPN labs daily until clinically indicated to monitor only Monday & Thursday.   8/15 Monitor Na for subsequent increase in total mEq/L for future orders due to history of hyponatremia.   Effie Shy PGY1 Pharmacy Resident 06/26/2023,11:03 AM

## 2023-06-26 NOTE — Progress Notes (Signed)
[facility] Progress Note  Patient: Shane Jenkins    WUJ:[id number]  DOA: 06/23/2023     Date of Service: the patient was seen and examined on 06/26/2023  Chief Complaint  Patient presents with   Abdominal Pain   Brief hospital course: Shane Boivin Sr. is a 37 y.o. Caucasian male with medical history significant for essential hypertension, alcohol abuse, currently sober for 6 months and alcoholic pancreatitis with pancreatic insufficiency, who presented to the emergency room with acute onset of worsening abdominal pain over the last week mainly in the epigastric area in the left upper quadrant, which significantly deteriorated today with associated nausea without vomiting.  He has been having constipation but is passing flatus.  No fever or chills.  No dysuria, oliguria or hematuria or flank pain.   ED Course: When he came to the ER, BP was 147/100 with respiratory to 24 with otherwise normal vital signs.  Labs revealed hyponatremia 124 and above chloremia of 88 with CO2 of 19 and glucose 124, calcium 8.6 and anion gap of 17.  Albumin was 2.8 and total bili 1.5.  Serum lipase came back elevated at 386.  Lactic acid was 2 and CBC showed leukocytosis of 14.5.  Coagulation profile was normal.  Respiratory panel including COVID-19 PCR came back negative.  Blood cultures were drawn.      EKG as reviewed by me : EKG showed sinus tachycardia with rate 150 with right atrial enlargement and minimal voltage criteria for LVH with peaked T waves and true lateral leads and T wave inversion inferiorly.    Imaging: Abdominal and pelvic CT scan with contrast revealed the following: Possible mild acute pancreatitis, equivocal. Correlate with laboratory evaluation.   Associated small pseudocysts measuring up to 17 mm in the uncinate process, mildly progressive.   Moderate abdominopelvic ascites, progressive. No drainable walled-off fluid collection.   Assessment and Plan:  Acute pancreatitis with   ascites according to patient he has not used any alcohol within the last 6 months Continue current IV fluid We will keep n.p.o. Continue current pain regiment We appreciate the input of surgery as well as gastroenterology team CT scan of the abdomen showed I personally reviewed the patient's CT scan of the abdomen showed findings of acute pancreatitis as well as pancreatic pseudocyst S/p paracentesis 4.2 L of ascitic fluid removed on 06/24/2023 Analysis of ascitic fluid shows possible pancreatic source other than cirrhosis. Plan of care discussed with surgeon as well as gastroenterologist at this point with recommendation for TPN and octreotide. We have discussed the case with interventionist who recommended TPN and octreotide and if after some time there is no improvement then patient will likely need stent placement. We will keep patient n.p.o. at this time Patient will need transfer to tertiary facility [facility] or [facility]   Hypertensive urgency - Elevated BP could be secondary to pain and stress - Continue losartan - Continue as needed IV labetalol.   Hypotonic hyponatremia Most likely due to volume overload Patient is n.p.o., continue NS Monitor sodium level daily    Pancreatic insufficiency - Resume Creon when diet will be resumed   Tobacco abuse - Smoking cessation counseling done.  Constipation, started laxatives.   Body mass index is 18.47 kg/m.  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  The pt provided permission to discuss medical plan with the family. Opportunity was given to ask question and all questions were answered satisfactorily.   Disposition:  Pt is from Home, admitted  with acute pancreatitis and pseudocyst, still n.p.o. on TPN, which precludes a safe discharge. Discharge to home versus transfer to higher level of care for pancreatic stent insertion if no improvement as per general surgery.  Follow GI for further management.  Complicated case, may require several days to improve.  Subjective: No significant events overnight, patient is having abdominal pain 8/10, which improved after pain medication currently pain is 4/10.  Patient says that he has not moved bowels for the past 2 weeks due to decreased p.o. intake.  Denied any other active issues.  Physical Exam: General: NAD, lying comfortably Appear in no distress, affect appropriate Eyes: PERRLA ENT: Oral Mucosa Clear, moist  Neck: no JVD,  Cardiovascular: S1 and S2 Present, no Murmur,  Respiratory: good respiratory effort, Bilateral Air entry equal and Decreased, no Crackles, no wheezes Abdomen: BS positive, distended secondary to ascites, generalized tenderness. Skin: no rashes Extremities: no Pedal edema, no calf tenderness Neurologic: without any new focal findings Gait not checked due to patient safety concerns  Vitals:   06/25/23 1631 06/25/23 2056 06/26/23 0441 06/26/23 0859  BP: 123/80 130/82 131/81 134/83  Pulse: 93 95 92 95  Resp: 16 16 19 16   Temp: 98.9 F (37.2 C) 98.6 F (37 C) 99 F (37.2 C) 98.5 F (36.9 C)  TempSrc:      SpO2: 100% 100% 97% 100%  Weight:      Height:        Intake/Output Summary (Last 24 hours) at 06/26/2023 1512 Last data filed at 06/26/2023 0645 Gross per 24 hour  Intake 2384.86 ml  Output 200 ml  Net 2184.86 ml   Filed Weights   06/23/23 1758  Weight: 63.5 kg    Data Reviewed: I have personally reviewed and interpreted daily labs, tele strips, imagings as discussed above. I reviewed all nursing notes, pharmacy notes, vitals, pertinent old records I have discussed plan of care as described above with RN and patient/family.  CBC: Recent Labs   Lab 06/23/23 1801 06/24/23 0519 06/25/23 0433 06/26/23 0938  WBC 14.5* 11.8* 10.8* 7.6  HGB 16.5 13.9 12.7* 10.6*  HCT 49.1 39.8 36.2* 31.2*  MCV 86.7 84.1 84.4 87.4  PLT 301 253 243 235   Basic Metabolic Panel: Recent Labs  Lab 06/23/23 1801 06/23/23 1857 06/24/23 0519 06/25/23 0433 06/26/23 0938  NA 124*  --  128* 125* 131*  K 4.5  --  4.4 4.1 4.1  CL 88*  --  95* 96* 103  CO2 19*  --  24 21* 22  GLUCOSE 124*  --  106* 107* 118*  BUN QUANTITY NOT SUFFICIENT, UNABLE TO PERFORM TEST 14 14 10 11   CREATININE QUANTITY NOT SUFFICIENT, UNABLE TO PERFORM TEST 0.93 0.88 0.70 0.58*  CALCIUM 8.6*  --  7.8* 7.5* 7.6*  MG  --   --   --   --  2.0  PHOS  --   --   --   --  2.7    Studies: No results found.  Scheduled Meds:  Chlorhexidine Gluconate Cloth  6 each Topical Daily   enoxaparin (LOVENOX) injection  40 mg Subcutaneous Q24H   octreotide  100 mcg Subcutaneous TID   sodium chloride flush  10-40 mL Intracatheter Q12H   [START ON 06/27/2023] thiamine (  VITAMIN B1) injection  100 mg Intravenous Q24H   Continuous Infusions:  sodium chloride 50 mL/hr at 06/26/23 1102   cefTRIAXone (ROCEPHIN)  IV 2 g (06/26/23 1057)   TPN ADULT (ION) 35 mL/hr at 06/25/23 1744   TPN ADULT (ION)     PRN Meds: acetaminophen **OR** acetaminophen, ketorolac, labetalol, magnesium hydroxide, morphine injection, ondansetron **OR** ondansetron (ZOFRAN) IV, mouth rinse, sodium chloride flush, traZODone  Time spent: 35 minutes  Author: Gillis Santa. MD [facility] 06/26/2023 3:12 PM  To reach On-call, see care teams to locate the attending and reach out to them via [email or web address]. If 7PM-7AM, please contact night-coverage If you still have difficulty reaching the attending provider, please page the [facility] (Director on Call) for [facility] on amion for assistance.

## 2023-06-26 NOTE — Plan of Care (Signed)
  Problem: Clinical Measurements: Goal: Ability to maintain clinical measurements within normal limits will improve Outcome: Progressing Goal: Will remain free from infection Outcome: Progressing Goal: Diagnostic test results will improve Outcome: Progressing Goal: Respiratory complications will improve Outcome: Progressing Goal: Cardiovascular complication will be avoided Outcome: Progressing   Problem: Activity: Goal: Risk for activity intolerance will decrease Outcome: Progressing   Problem: Elimination: Goal: Will not experience complications related to bowel motility Outcome: Progressing   Problem: Pain Managment: Goal: General experience of comfort will improve Outcome: Progressing   Problem: Safety: Goal: Ability to remain free from injury will improve Outcome: Progressing   Problem: Skin Integrity: Goal: Risk for impaired skin integrity will decrease Outcome: Progressing

## 2023-06-27 ENCOUNTER — Encounter: Payer: Self-pay | Admitting: Family Medicine

## 2023-06-27 DIAGNOSIS — K859 Acute pancreatitis without necrosis or infection, unspecified: Secondary | ICD-10-CM | POA: Diagnosis not present

## 2023-06-27 LAB — GLUCOSE, CAPILLARY
Glucose-Capillary: 111 mg/dL — ABNORMAL HIGH (ref 70–99)
Glucose-Capillary: 112 mg/dL — ABNORMAL HIGH (ref 70–99)
Glucose-Capillary: 143 mg/dL — ABNORMAL HIGH (ref 70–99)

## 2023-06-27 LAB — CBC
HCT: 30.7 % — ABNORMAL LOW (ref 39.0–52.0)
Hemoglobin: 10.6 g/dL — ABNORMAL LOW (ref 13.0–17.0)
MCH: 29.9 pg (ref 26.0–34.0)
MCHC: 34.5 g/dL (ref 30.0–36.0)
MCV: 86.5 fL (ref 80.0–100.0)
Platelets: 270 10*3/uL (ref 150–400)
RBC: 3.55 MIL/uL — ABNORMAL LOW (ref 4.22–5.81)
RDW: 13.4 % (ref 11.5–15.5)
WBC: 7.6 10*3/uL (ref 4.0–10.5)
nRBC: 0 % (ref 0.0–0.2)

## 2023-06-27 LAB — VITAMIN D 25 HYDROXY (VIT D DEFICIENCY, FRACTURES): Vit D, 25-Hydroxy: 37.9 ng/mL (ref 30–100)

## 2023-06-27 LAB — BASIC METABOLIC PANEL
Anion gap: 5 (ref 5–15)
BUN: 13 mg/dL (ref 6–20)
CO2: 23 mmol/L (ref 22–32)
Calcium: 7.7 mg/dL — ABNORMAL LOW (ref 8.9–10.3)
Chloride: 105 mmol/L (ref 98–111)
Creatinine, Ser: 0.66 mg/dL (ref 0.61–1.24)
GFR, Estimated: >60 mL/min (ref >60)
Glucose, Bld: 125 mg/dL — ABNORMAL HIGH (ref 70–99)
Potassium: 4.4 mmol/L (ref 3.5–5.1)
Sodium: 133 mmol/L — ABNORMAL LOW (ref 135–145)

## 2023-06-27 LAB — IRON AND TIBC: Iron: 8 ug/dL — ABNORMAL LOW (ref 45–182)

## 2023-06-27 LAB — LIPASE, BLOOD: Lipase: 452 U/L — ABNORMAL HIGH (ref 11–51)

## 2023-06-27 LAB — HEPATIC FUNCTION PANEL
ALT: 9 U/L (ref 0–44)
AST: 14 U/L — ABNORMAL LOW (ref 15–41)
Albumin: 1.9 g/dL — ABNORMAL LOW (ref 3.5–5.0)
Alkaline Phosphatase: 29 U/L — ABNORMAL LOW (ref 38–126)
Bilirubin, Direct: <0.1 mg/dL (ref 0.0–0.2)
Total Bilirubin: 0.3 mg/dL (ref 0.3–1.2)
Total Protein: 4.5 g/dL — ABNORMAL LOW (ref 6.5–8.1)

## 2023-06-27 LAB — MAGNESIUM: Magnesium: 1.9 mg/dL (ref 1.7–2.4)

## 2023-06-27 LAB — PHOSPHORUS: Phosphorus: 2.9 mg/dL (ref 2.5–4.6)

## 2023-06-27 LAB — VITAMIN B12: Vitamin B-12: 234 pg/mL (ref 180–914)

## 2023-06-27 MED ORDER — TRACE MINERALS CU-MN-SE-ZN 300-55-60-3000 MCG/ML IV SOLN
INTRAVENOUS | Status: AC
  Filled 2023-06-27: qty 744

## 2023-06-27 MED ORDER — SODIUM CHLORIDE 0.9 % IV SOLN
200.0000 mg | Freq: Every day | INTRAVENOUS | Status: DC
  Administered 2023-06-27 – 2023-06-30 (×4): 200 mg via INTRAVENOUS
  Filled 2023-06-27 (×4): qty 200

## 2023-06-27 MED ORDER — PANTOPRAZOLE SODIUM 40 MG PO TBEC
40.0000 mg | DELAYED_RELEASE_TABLET | Freq: Every day | ORAL | Status: DC
  Administered 2023-06-27 – 2023-07-05 (×9): 40 mg via ORAL
  Filled 2023-06-27 (×9): qty 1

## 2023-06-27 NOTE — Plan of Care (Signed)
  Problem: Clinical Measurements: Goal: Ability to maintain clinical measurements within normal limits will improve Outcome: Progressing   Problem: Activity: Goal: Risk for activity intolerance will decrease Outcome: Progressing   Problem: Elimination: Goal: Will not experience complications related to bowel motility Outcome: Progressing Goal: Will not experience complications related to urinary retention Outcome: Progressing

## 2023-06-27 NOTE — Consult Note (Signed)
PHARMACY - TOTAL PARENTERAL NUTRITION CONSULT NOTE   Indication:  Pancreatitis ascites with abscess. NPO  Patient Measurements: Height: 6\' 1"  (185.4 cm) Weight: 63.5 kg (140 lb) IBW/kg (Calculated) : 79.9 TPN AdjBW (KG): 63.5 Body mass index is 18.47 kg/m. Usual Weight: 63.5  Assessment:  Shane Jenkins is a 37 yo males with recurrent alcohol pancreatitis with a hx of alcohol abuse (reported last drink 6 months ago). Patient has a past medical history of pancreatic pseudocysts (stable, small, chronic) and hyponatremia. Pharmacy was consulted by gastro to start TPN on this patient given their complicated diagnosis - pancreatic ascites.   Glucose / Insulin: BG 125 (Novolog 1 unit in the last 24 hours.  Electrolytes: 8/15: WNL Renal: SCr Stable at baseline (0.7) Hepatic: WNL, Lipase 299 >> 522 > 452 Intake / Output; MIVF: positive 8.2 L GI Imaging: 8/15: moderate volume ascites and mesenteric edema.  GI Surgeries / Procedures: 8/14 Paracentesis with 4.2 L of fluid removed   Central access: 8/15 TPN start date: 8/15  Nutritional Goals: Goal TPN rate is 75 mL/hr (see components below)  RD Assessment: Estimated Needs Total Energy Estimated Needs: 2100-2400kcal/day Total Protein Estimated Needs: 105-120g/day Total Fluid Estimated Needs: 1.9-2.2L/day  Current Nutrition:  NPO  Plan:  Continue TPN at 75 mL/hr (goal rate) at 1800  Clinisol, 15% Dextrose, 19% Lipid, 38 g/L Protein, 111.6 g/day Kcal, 2200 kcal/day Electrolytes in TPN: Na 13mEq/L, K 61mEq/L, Ca 62mEq/L, Mg 98mEq/L, and Phos 34mmol/L. Cl:Ac 1:1 Add standard MVI and trace elements to TPN Continue fluids NS@50  due to pancreatitis  Initiate Sensitive q8h SSI and adjust as needed  Monitor TPN labs daily until clinically indicated to monitor only Monday & Thursday.   Ronnald Ramp. PharmD, BCPS 06/27/2023,9:07 AM

## 2023-06-27 NOTE — Progress Notes (Signed)
[facility] Progress Note  Patient: Shane Jenkins    VZD:[id number]  DOA: 06/23/2023     Date of Service: the patient was seen and examined on 06/27/2023  Chief Complaint  Patient presents with   Abdominal Pain   Brief hospital course: Demere Capel Sr. is a 37 y.o. Caucasian male with medical history significant for essential hypertension, alcohol abuse, currently sober for 6 months and alcoholic pancreatitis with pancreatic insufficiency, who presented to the emergency room with acute onset of worsening abdominal pain over the last week mainly in the epigastric area in the left upper quadrant, which significantly deteriorated today with associated nausea without vomiting.  He has been having constipation but is passing flatus.  No fever or chills.  No dysuria, oliguria or hematuria or flank pain.   ED Course: When he came to the ER, BP was 147/100 with respiratory to 24 with otherwise normal vital signs.  Labs revealed hyponatremia 124 and above chloremia of 88 with CO2 of 19 and glucose 124, calcium 8.6 and anion gap of 17.  Albumin was 2.8 and total bili 1.5.  Serum lipase came back elevated at 386.  Lactic acid was 2 and CBC showed leukocytosis of 14.5.  Coagulation profile was normal.  Respiratory panel including COVID-19 PCR came back negative.  Blood cultures were drawn.      EKG as reviewed by me : EKG showed sinus tachycardia with rate 150 with right atrial enlargement and minimal voltage criteria for LVH with peaked T waves and true lateral leads and T wave inversion inferiorly.    Imaging: Abdominal and pelvic CT scan with contrast revealed the following: Possible mild acute pancreatitis, equivocal. Correlate with laboratory evaluation.   Associated small pseudocysts measuring up to 17 mm in the uncinate process, mildly progressive.   Moderate abdominopelvic ascites, progressive. No drainable walled-off fluid collection.   Assessment and Plan:  #Acute pancreatitis with   ascites according to patient he has not used any alcohol within the last 6 months Continue current IV fluid We will keep n.p.o. Continue current pain regiment We appreciate the input of surgery as well as gastroenterology team CT scan of the abdomen showed I personally reviewed the patient's CT scan of the abdomen showed findings of acute pancreatitis as well as pancreatic pseudocyst S/p paracentesis 4.2 L of ascitic fluid removed on 06/24/2023 Analysis of ascitic fluid shows possible pancreatic source other than cirrhosis. Plan of care discussed with surgeon as well as gastroenterologist at this point with recommendation for TPN and octreotide. We have discussed the case with interventionist who recommended TPN and octreotide and if after some time there is no improvement then patient will likely need stent placement. We will keep patient n.p.o. at this time Patient will need transfer to tertiary facility [facility] or [facility] Lipase [phone]    #Hypertensive urgency - Elevated BP could be secondary to pain and stress - Continue losartan - Continue as needed IV labetalol.   # Hypotonic hyponatremia Most likely due to volume overload Patient is n.p.o., continue NS Na 133 Monitor sodium level daily    # Pancreatic insufficiency - Resume Creon when diet will be resumed   # Tobacco abuse - Smoking cessation counseling done.  # Constipation, started laxatives.  # Iron deficiency, iron level is 8, could not calculate transferrin saturation. Started Venofer 200 mg IV daily x 5 days followed by oral supplement on discharge. Follow-up with PCP to repeat iron profile after 3 to 6 months.  Body mass index is 18.47 kg/m.  Nutrition Problem: Moderate Malnutrition Etiology: chronic illness (pancreatitis, suspected EPI, etoh abuse) Interventions:  Diet: N.p.o., continue TPN DVT Prophylaxis: Subcutaneous Lovenox   Advance goals of care discussion: Full code  Family  Communication: family was not present at bedside, at the time of interview.  The pt provided permission to discuss medical plan with the family. Opportunity was given to ask question and all questions were answered satisfactorily.   Disposition:  Pt is from Home, admitted with acute pancreatitis and pseudocyst, still n.p.o. on TPN, which precludes a safe discharge. Discharge to home versus transfer to higher level of care for pancreatic stent insertion if no improvement as per general surgery.  Follow GI for further management.  Complicated case, may require several days to improve.  Subjective: No significant events overnight, patient was complaining of abdominal pain 9/10, improved after pain medication to 6/10, patient did move bowels yesterday.  Denied any other complaints.   Physical Exam: General: NAD, lying comfortably Appear in no distress, affect appropriate Eyes: PERRLA ENT: Oral Mucosa Clear, moist  Neck: no JVD,  Cardiovascular: S1 and S2 Present, no Murmur,  Respiratory: good respiratory effort, Bilateral Air entry equal and Decreased, no Crackles, no wheezes Abdomen: BS positive, distended due to ascites, generalized tenderness. Skin: no rashes Extremities: no Pedal edema, no calf tenderness Neurologic: without any new focal findings Gait not checked due to patient safety concerns  Vitals:   06/26/23 1700 06/26/23 1924 06/27/23 0442 06/27/23 0803  BP: 120/77 (!) 151/85 (!) 140/85 (!) 150/94  Pulse: 87 98 86 95  Resp: 16 18 16 16   Temp: 98.2 F (36.8 C) 99.2 F (37.3 C) 98.5 F (36.9 C) 97.8 F (36.6 C)  TempSrc: Oral Oral Oral   SpO2: 100% 97% 100% 100%  Weight:      Height:        Intake/Output Summary (Last 24 hours) at 06/27/2023 1328 Last data filed at 06/27/2023 1209 Gross per 24 hour  Intake 3582.07 ml  Output 850 ml  Net 2732.07 ml   Filed Weights   06/23/23 1758  Weight: 63.5 kg    Data Reviewed: I have personally reviewed and interpreted  daily labs, tele strips, imagings as discussed above. I reviewed all nursing notes, pharmacy notes, vitals, pertinent old records I have discussed plan of care as described above with RN and patient/family.  CBC: Recent Labs  Lab 06/23/23 1801 06/24/23 0519 06/25/23 0433 06/26/23 0938 06/27/23 0513  WBC 14.5* 11.8* 10.8* 7.6 7.6  HGB 16.5 13.9 12.7* 10.6* 10.6*  HCT 49.1 39.8 36.2* 31.2* 30.7*  MCV 86.7 84.1 84.4 87.4 86.5  PLT 301 253 243 235 270   Basic Metabolic Panel: Recent Labs  Lab 06/23/23 1801 06/23/23 1857 06/24/23 0519 06/25/23 0433 06/26/23 0938 06/27/23 0513  NA 124*  --  128* 125* 131* 133*  K 4.5  --  4.4 4.1 4.1 4.4  CL 88*  --  95* 96* 103 105  CO2 19*  --  24 21* 22 23  GLUCOSE 124*  --  106* 107* 118* 125*  BUN QUANTITY NOT SUFFICIENT, UNABLE TO PERFORM TEST 14 14 10 11 13   CREATININE QUANTITY NOT SUFFICIENT, UNABLE TO PERFORM TEST 0.93 0.88 0.70 0.58* 0.66  CALCIUM 8.6*  --  7.8* 7.5* 7.6* 7.7*  MG  --   --   --   --  2.0 1.9  PHOS  --   --   --   --  2.7 2.9    Studies: No results found.  Scheduled Meds:  bisacodyl  10 mg Oral QHS   Chlorhexidine Gluconate Cloth  6 each Topical Daily   enoxaparin (LOVENOX) injection  40 mg Subcutaneous Q24H   insulin aspart  0-9 Units Subcutaneous Q8H   octreotide  100 mcg Subcutaneous TID   polyethylene glycol  17 g Oral BID   sodium chloride flush  10-40 mL Intracatheter Q12H   thiamine (VITAMIN B1) injection  100 mg Intravenous Q24H   Continuous Infusions:  sodium chloride Stopped (06/27/23 1156)   cefTRIAXone (ROCEPHIN)  IV 200 mL/hr at 06/27/23 1209   TPN ADULT (ION) 75 mL/hr at 06/27/23 1209   TPN ADULT (ION)     PRN Meds: acetaminophen **OR** acetaminophen, ketorolac, labetalol, magnesium hydroxide, morphine injection, ondansetron **OR** ondansetron (ZOFRAN) IV, mouth rinse, sodium chloride flush  Time spent: 35 minutes  Author: Gillis Santa. MD [facility] 06/27/2023 1:28 PM  To reach  On-call, see care teams to locate the attending and reach out to them via [email or web address]. If 7PM-7AM, please contact night-coverage If you still have difficulty reaching the attending provider, please page the [facility] (Director on Call) for [facility] on amion for assistance.

## 2023-06-28 DIAGNOSIS — R188 Other ascites: Secondary | ICD-10-CM | POA: Diagnosis not present

## 2023-06-28 DIAGNOSIS — K859 Acute pancreatitis without necrosis or infection, unspecified: Secondary | ICD-10-CM | POA: Diagnosis not present

## 2023-06-28 LAB — BASIC METABOLIC PANEL
Anion gap: 9 (ref 5–15)
BUN: 13 mg/dL (ref 6–20)
CO2: 23 mmol/L (ref 22–32)
Calcium: 7.9 mg/dL — ABNORMAL LOW (ref 8.9–10.3)
Chloride: 103 mmol/L (ref 98–111)
Creatinine, Ser: 0.65 mg/dL (ref 0.61–1.24)
GFR, Estimated: >60 mL/min (ref >60)
Glucose, Bld: 141 mg/dL — ABNORMAL HIGH (ref 70–99)
Potassium: 4 mmol/L (ref 3.5–5.1)
Sodium: 135 mmol/L (ref 135–145)

## 2023-06-28 LAB — BODY FLUID CULTURE W GRAM STAIN: Culture: NO GROWTH

## 2023-06-28 LAB — GLUCOSE, CAPILLARY
Glucose-Capillary: 124 mg/dL — ABNORMAL HIGH (ref 70–99)
Glucose-Capillary: 139 mg/dL — ABNORMAL HIGH (ref 70–99)
Glucose-Capillary: 138 mg/dL — ABNORMAL HIGH (ref 70–99)

## 2023-06-28 LAB — MAGNESIUM: Magnesium: 1.7 mg/dL (ref 1.7–2.4)

## 2023-06-28 LAB — TRIGLYCERIDES, BODY FLUIDS: Triglycerides, Fluid: 25 mg/dL

## 2023-06-28 LAB — TOTAL BILIRUBIN, BODY FLUID: Total bilirubin, fluid: 0.5 mg/dL

## 2023-06-28 LAB — PHOSPHORUS: Phosphorus: 3.5 mg/dL (ref 2.5–4.6)

## 2023-06-28 LAB — LIPASE, BLOOD: Lipase: 359 U/L — ABNORMAL HIGH (ref 11–51)

## 2023-06-28 MED ORDER — CYANOCOBALAMIN 1000 MCG/ML IJ SOLN
1000.0000 ug | Freq: Every day | INTRAMUSCULAR | Status: DC
  Administered 2023-06-28 – 2023-06-30 (×3): 1000 ug via INTRAMUSCULAR
  Filled 2023-06-28 (×5): qty 1

## 2023-06-28 MED ORDER — INSULIN ASPART 100 UNIT/ML IJ SOLN
0.0000 [IU] | Freq: Four times a day (QID) | INTRAMUSCULAR | Status: DC
  Administered 2023-06-28 – 2023-06-29 (×3): 1 [IU] via SUBCUTANEOUS
  Administered 2023-06-29: 2 [IU] via SUBCUTANEOUS
  Administered 2023-06-29 – 2023-07-03 (×9): 1 [IU] via SUBCUTANEOUS
  Filled 2023-06-28 (×13): qty 1

## 2023-06-28 MED ORDER — TRACE MINERALS CU-MN-SE-ZN 300-55-60-3000 MCG/ML IV SOLN
INTRAVENOUS | Status: AC
  Filled 2023-06-28: qty 744

## 2023-06-28 MED ORDER — VITAMIN B-12 1000 MCG PO TABS: 1000.0000 ug | ORAL_TABLET | Freq: Every day | ORAL | Status: DC

## 2023-06-28 MED ORDER — TRAZODONE HCL 50 MG PO TABS
50.0000 mg | ORAL_TABLET | Freq: Every evening | ORAL | Status: DC | PRN
  Administered 2023-06-28: 50 mg via ORAL
  Filled 2023-06-28: qty 1

## 2023-06-28 MED ORDER — MAGNESIUM SULFATE 2 GM/50ML IV SOLN
2.0000 g | Freq: Once | INTRAVENOUS | Status: AC
  Administered 2023-06-28: 2 g via INTRAVENOUS
  Filled 2023-06-28: qty 50

## 2023-06-28 NOTE — Progress Notes (Signed)
Wyline Mood , MD [address], [place], [place], [address] [address], [place], [place], [address] Phone: [phone]  Fax: [phone]   Shane Lingren Sr. is being followed for acute on chronic alcoholic pancreatitis complicated with ascites   Subjective: Feels better, having bowel movements, no vomiting , denies any feevers   Objective: Vital signs in last 24 hours: Vitals:   06/27/23 0803 06/27/23 1919 06/28/23 0426 06/28/23 0905  BP: (!) 150/94 132/77 128/81 131/81  Pulse: 95 96 85 95  Resp: 16 16 20 18   Temp: 97.8 F (36.6 C) 99.3 F (37.4 C) 98.6 F (37 C) 98.6 F (37 C)  TempSrc:  Oral Oral   SpO2: 100% 96% 100% 98%  Weight:   73 kg   Height:       Weight change:   Intake/Output Summary (Last 24 hours) at 06/28/2023 1246 Last data filed at 06/28/2023 0102 Gross per 24 hour  Intake 2268.93 ml  Output 500 ml  Net 1768.93 ml     Exam: Awake and alert, appears comfortable not in any distress Abdomen: soft,distended , free fluid + nontender, normal bowel sounds   Lab Results: @LABTEST2 @ Micro Results: Recent Results (from the past 240 hour(s))  Blood Culture (routine x 2)     Status: None   Collection Time: 06/23/23  6:10 PM   Specimen: BLOOD  Result Value Ref Range Status   Specimen Description BLOOD BLOOD RIGHT WRIST  Final   Special Requests   Final    BOTTLES DRAWN AEROBIC AND ANAEROBIC Blood Culture results may not be optimal due to an inadequate volume of blood received in culture bottles   Culture   Final    NO GROWTH 5 DAYS Performed at [facility], [address] [place], [place] [address]    Report Status 06/28/2023 FINAL  Final  Resp panel by RT-PCR (RSV, Flu A&B, Covid) Anterior Nasal Swab     Status: None   Collection Time: 06/23/23  6:56 PM   Specimen: Anterior Nasal Swab  Result Value Ref Range Status   SARS Coronavirus 2 by RT PCR NEGATIVE NEGATIVE Final    Comment: (NOTE) SARS-CoV-2 target  nucleic acids are NOT DETECTED.  The SARS-CoV-2 RNA is generally detectable in upper respiratory specimens during the acute phase of infection. The lowest concentration of SARS-CoV-2 viral copies this assay can detect is 138 copies/mL. A negative result does not preclude SARS-Cov-2 infection and should not be used as the sole basis for treatment or other patient management decisions. A negative result may occur with  improper specimen collection/handling, submission of specimen other than nasopharyngeal swab, presence of viral mutation(s) within the areas targeted by this assay, and inadequate number of viral copies(<138 copies/mL). A negative result must be combined with clinical observations, patient history, and epidemiological information. The expected result is Negative.  Fact Sheet for Patients:  BloggerCourse.com  Fact Sheet for Healthcare Providers:  SeriousBroker.it  This test is no t yet approved or cleared by the [place] FDA and  has been authorized for detection and/or diagnosis of SARS-CoV-2 by FDA under an Emergency Use Authorization (EUA). This EUA will remain  in effect (meaning this test can be used) for the duration of the COVID-19 declaration under Section 564(b)(1) of the Act, 21 U.S.C.section 360bbb-3(b)(1), unless the authorization is terminated  or revoked sooner.       Influenza A by PCR NEGATIVE NEGATIVE Final   Influenza B by PCR NEGATIVE NEGATIVE  Final    Comment: (NOTE) The Xpert Xpress SARS-CoV-2/FLU/RSV plus assay is intended as an aid in the diagnosis of influenza from Nasopharyngeal swab specimens and should not be used as a sole basis for treatment. Nasal washings and aspirates are unacceptable for Xpert Xpress SARS-CoV-2/FLU/RSV testing.  Fact Sheet for Patients: BloggerCourse.com  Fact Sheet for Healthcare  Providers: SeriousBroker.it  This test is not yet approved or cleared by the [place] FDA and has been authorized for detection and/or diagnosis of SARS-CoV-2 by FDA under an Emergency Use Authorization (EUA). This EUA will remain in effect (meaning this test can be used) for the duration of the COVID-19 declaration under Section 564(b)(1) of the Act, 21 U.S.C. section 360bbb-3(b)(1), unless the authorization is terminated or revoked.     Resp Syncytial Virus by PCR NEGATIVE NEGATIVE Final    Comment: (NOTE) Fact Sheet for Patients: BloggerCourse.com  Fact Sheet for Healthcare Providers: SeriousBroker.it  This test is not yet approved or cleared by the [place] FDA and has been authorized for detection and/or diagnosis of SARS-CoV-2 by FDA under an Emergency Use Authorization (EUA). This EUA will remain in effect (meaning this test can be used) for the duration of the COVID-19 declaration under Section 564(b)(1) of the Act, 21 U.S.C. section 360bbb-3(b)(1), unless the authorization is terminated or revoked.  Performed at [facility], [address] [place], [place] [address]   Blood Culture (routine x 2)     Status: None   Collection Time: 06/23/23  6:56 PM   Specimen: BLOOD  Result Value Ref Range Status   Specimen Description BLOOD LEFT ANTECUBITAL  Final   Special Requests   Final    BOTTLES DRAWN AEROBIC AND ANAEROBIC Blood Culture adequate volume   Culture   Final    NO GROWTH 5 DAYS Performed at [facility], [address], [place], [place] [address]    Report Status 06/28/2023 FINAL  Final  Fungus Culture With Stain     Status: None (Preliminary result)   Collection Time: 06/24/23  3:30 PM   Specimen: PATH Cytology Peritoneal fluid  Result Value Ref Range Status   Fungus Stain Final report  Final    Comment: (NOTE) Performed At: [facility] [address] [place], [place] [id number] Jolene Schimke MD EX:[id number]    Fungus (Mycology) Culture PENDING  Incomplete   Fungal Source PERITONEAL  Final    Comment: Performed at [facility], [address] [place], [place] [address]  Acid Fast Smear (AFB)     Status: None   Collection Time: 06/24/23  3:30 PM   Specimen: PATH Cytology Peritoneal fluid  Result Value Ref Range Status   AFB Specimen Processing Concentration  Final   Acid Fast Smear Negative  Final    Comment: (NOTE) Performed At: [facility] [address] [place], [place] [id number] Jolene Schimke MD VQ:[id number]    Source (AFB) PERITONEAL  Final    Comment: Performed at [facility], [address] [place], [place] [address]  Body fluid culture w Gram Stain     Status: None   Collection Time: 06/24/23  3:30 PM   Specimen: PATH Cytology Peritoneal fluid  Result Value Ref Range Status   Specimen Description   Final    PERITONEAL Performed at [facility], [address], [place], [place] [address]    Special Requests   Final    PERITONEAL Performed at [facility], [address], [place], [place]  [address]    Gram Stain   Final    RARE WBC PRESENT,BOTH PMN AND MONONUCLEAR NO ORGANISMS SEEN    Culture   Final    NO GROWTH 3 DAYS Performed at [facility], [address], [place], [place] [address]    Report Status 06/28/2023 FINAL  Final  Anaerobic culture w Gram Stain     Status: None (Preliminary result)   Collection Time: 06/24/23  3:30 PM   Specimen: Peritoneal Washings  Result Value Ref Range Status   Specimen Description PERITONEAL  Final   Special Requests   Final    NONE Performed at [facility], [address] [place], [place] [address]    Culture   Final    NO ANAEROBES ISOLATED; CULTURE IN PROGRESS FOR 5 DAYS   Report Status PENDING  Incomplete  Fungus Culture Result     Status: None   Collection Time: 06/24/23  3:30 PM   Result Value Ref Range Status   Result 1 Comment  Final    Comment: (NOTE) KOH/Calcofluor preparation:  no fungus observed. Performed At: [facility] [address] [place], [place] [id number] Jolene Schimke MD QI:[id number]    Studies/Results: No results found. Medications: I have reviewed the patient's current medications. Scheduled Meds:  bisacodyl  10 mg Oral QHS   Chlorhexidine Gluconate Cloth  6 each Topical Daily   cyanocobalamin  1,000 mcg Intramuscular Q1200   Followed by   Melene Muller ON 07/05/2023] vitamin B-12  1,000 mcg Oral Daily   enoxaparin (LOVENOX) injection  40 mg Subcutaneous Q24H   insulin aspart  0-9 Units Subcutaneous Q6H   octreotide  100 mcg Subcutaneous TID   pantoprazole  40 mg Oral Daily   polyethylene glycol  17 g Oral BID   sodium chloride flush  10-40 mL Intracatheter Q12H   thiamine (VITAMIN B1) injection  100 mg Intravenous Q24H   Continuous Infusions:  sodium chloride 50 mL/hr at 06/27/23 1912   cefTRIAXone (ROCEPHIN)  IV 2 g (06/28/23 1158)   iron sucrose 200 mg (06/28/23 0918)   TPN ADULT (ION) 75 mL/hr at 06/27/23 1913   TPN ADULT (ION)     PRN Meds:.acetaminophen **OR** acetaminophen, ketorolac, labetalol, magnesium hydroxide, morphine injection, ondansetron **OR** ondansetron (ZOFRAN) IV, mouth rinse, sodium chloride flush   Assessment: Principal Problem:   Acute pancreatitis Active Problems:   Hyponatremia   Protein-calorie malnutrition, severe   Hypertensive urgency   Pancreatic insufficiency   Tobacco abuse   Pseudocyst of pancreas   Shane Sergeant Sr. 37 y.o. male being followed for acute on chronic pancreatitis with ascites on IV antibiotics.  On TPN.  Creatinine is 0.65 today stable calcium 7.9 stable no CBC checked today.  Hepatic function checked yesterday showed no elevation in total bilirubin albumin is 1.9.  Plan: 1.  Continue TPN monitor electrolytes and replace as needed, ? Start on clears tomorrow as a trial    2.  MRI of the liver on 06/25/2023 demonstrates findings of chronic pancreatitis with intraparenchymal pseudocysts moderate volume ascites.  The management of this is conservative.  3.  [facility] will follow the patient back from tomorrow   LOS: 5 days   Wyline Mood, MD 06/28/2023, 12:46 PM

## 2023-06-28 NOTE — Consult Note (Signed)
PHARMACY - TOTAL PARENTERAL NUTRITION CONSULT NOTE   Indication:  Pancreatitis ascites with abscess. NPO  Patient Measurements: Height: 6\' 1"  (185.4 cm) Weight: 73 kg (160 lb 15 oz) IBW/kg (Calculated) : 79.9 TPN AdjBW (KG): 63.5 Body mass index is 21.23 kg/m. Usual Weight: 63.5  Assessment:  Shane Jenkins is a 37 yo males with recurrent alcohol pancreatitis with a hx of alcohol abuse (reported last drink 6 months ago). Patient has a past medical history of pancreatic pseudocysts (stable, small, chronic) and hyponatremia. Pharmacy was consulted by gastro to start TPN on this patient given their complicated diagnosis - pancreatic ascites.   Glucose / Insulin: BG 141 (Novolog 1 unit in the last 24 hours.  Electrolytes: 8/15: WNL Renal: SCr Stable at baseline (0.7) Hepatic: WNL, Lipase 299 >> 522 > 452 > 359 Intake / Output; MIVF: positive 8.2 L GI Imaging: 8/15: moderate volume ascites and mesenteric edema.  GI Surgeries / Procedures: 8/14 Paracentesis with 4.2 L of fluid removed   Central access: 8/15 TPN start date: 8/15  Nutritional Goals: Goal TPN rate is 75 mL/hr (see components below)  RD Assessment: Estimated Needs Total Energy Estimated Needs: 2100-2400kcal/day Total Protein Estimated Needs: 105-120g/day Total Fluid Estimated Needs: 1.9-2.2L/day  Current Nutrition:  NPO  Plan:  Continue TPN at 75 mL/hr (goal rate) at 1800  Clinisol, 15% Dextrose, 19% Lipid, 38 g/L Protein, 111.6 g/day Kcal, 2200 kcal/day Electrolytes in TPN: Na 46mEq/L, K 33mEq/L, Ca 33mEq/L, Mg 66mEq/L, and Phos 74mmol/L. Cl:Ac 1:1 Mg 2 g IV x 1.  Add standard MVI and trace elements to TPN Continue fluids NS@50  due to pancreatitis  Initiate Sensitive q8h SSI and adjust as needed  Monitor TPN labs daily until clinically indicated to monitor only Monday & Thursday.   Ronnald Ramp. PharmD, BCPS 06/28/2023,8:23 AM

## 2023-06-28 NOTE — Progress Notes (Signed)
[facility] Progress Note  Patient: Shane Jenkins    ZOX:[id number]  DOA: 06/23/2023     Date of Service: the patient was seen and examined on 06/28/2023  Chief Complaint  Patient presents with   Abdominal Pain   Brief hospital course: Shane Loretto Sr. is a 37 y.o. Caucasian male with medical history significant for essential hypertension, alcohol abuse, currently sober for 6 months and alcoholic pancreatitis with pancreatic insufficiency, who presented to the emergency room with acute onset of worsening abdominal pain over the last week mainly in the epigastric area in the left upper quadrant, which significantly deteriorated today with associated nausea without vomiting.  He has been having constipation but is passing flatus.  No fever or chills.  No dysuria, oliguria or hematuria or flank pain.   ED Course: When he came to the ER, BP was 147/100 with respiratory to 24 with otherwise normal vital signs.  Labs revealed hyponatremia 124 and above chloremia of 88 with CO2 of 19 and glucose 124, calcium 8.6 and anion gap of 17.  Albumin was 2.8 and total bili 1.5.  Serum lipase came back elevated at 386.  Lactic acid was 2 and CBC showed leukocytosis of 14.5.  Coagulation profile was normal.  Respiratory panel including COVID-19 PCR came back negative.  Blood cultures were drawn.      EKG as reviewed by me : EKG showed sinus tachycardia with rate 150 with right atrial enlargement and minimal voltage criteria for LVH with peaked T waves and true lateral leads and T wave inversion inferiorly.    Imaging: Abdominal and pelvic CT scan with contrast revealed the following: Possible mild acute pancreatitis, equivocal. Correlate with laboratory evaluation.   Associated small pseudocysts measuring up to 17 mm in the uncinate process, mildly progressive.   Moderate abdominopelvic ascites, progressive. No drainable walled-off fluid collection.   Assessment and Plan:  #Acute pancreatitis with   ascites according to patient he has not used any alcohol within the last 6 months Continue current IV fluid We will keep n.p.o. Continue current pain regiment We appreciate the input of surgery as well as gastroenterology team CT scan of the abdomen showed I personally reviewed the patient's CT scan of the abdomen showed findings of acute pancreatitis as well as pancreatic pseudocyst S/p paracentesis 4.2 L of ascitic fluid removed on 06/24/2023 Analysis of ascitic fluid shows possible pancreatic source other than cirrhosis. Plan of care discussed with surgeon as well as gastroenterologist at this point with recommendation for TPN and octreotide. We have discussed the case with interventionist who recommended TPN and octreotide and if after some time there is no improvement then patient will likely need stent placement. We will keep patient n.p.o. at this time Patient will need transfer to tertiary facility [facility] or [facility] Lipase [phone]  [phone]  #Hypertensive urgency - Elevated BP could be secondary to pain and stress - Continue losartan - Continue as needed IV labetalol.   # Hypotonic hyponatremia Most likely due to volume overload Patient is n.p.o., continue NS Na 133 Monitor sodium level daily    # Pancreatic insufficiency - Resume Creon when diet will be resumed   # Tobacco abuse - Smoking cessation counseling done.  # Constipation, started laxatives.  # Iron deficiency, iron level is 8, could not calculate transferrin saturation. Started Venofer 200 mg IV daily x 5 days followed by oral supplement on discharge. Follow-up with PCP to repeat iron profile after 3 to 6 months.  #  Vitamin B12 level 234, goal >400.  Started vitamin B12 1000 mcg IM injection for 7 days followed by oral supplement.   Body mass index is 21.23 kg/m.  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  The pt provided permission to discuss medical plan with the family. Opportunity was given to ask question and all questions were answered satisfactorily.   Disposition:  Pt is from Home, admitted with acute pancreatitis and pseudocyst, still n.p.o. on TPN, which precludes a safe discharge. Discharge to home versus transfer to higher level of care for pancreatic stent insertion if no improvement as per general surgery.  Follow GI for further management.  Complicated case, may require several days to improve.  Subjective: No significant events overnight, patient is abdominal pain is improving, started no pain at this time, patient chest she is bathroom for BM.  Abdomen is not that much tight due to ascites, tolerating well.  Denies any worsening of shortness of breath, no chest pain or palpitation.   Physical Exam: General: NAD, lying comfortably Appear in no distress, affect appropriate Eyes: PERRLA ENT: Oral Mucosa Clear, moist  Neck: no JVD,  Cardiovascular: S1 and S2 Present, no Murmur,  Respiratory: good respiratory effort, Bilateral Air entry equal and Decreased, no Crackles, no wheezes Abdomen: BS positive, distended due to ascites, mild generalized tenderness. Skin: no rashes Extremities: no Pedal edema, no calf tenderness Neurologic: without any new focal findings Gait not checked due to patient safety concerns  Vitals:   06/27/23 0803 06/27/23 1919 06/28/23 0426 06/28/23 0905  BP: (!) 150/94 132/77 128/81 131/81  Pulse: 95 96 85 95  Resp: 16 16 20 18   Temp: 97.8 F (36.6 C) 99.3 F (37.4 C) 98.6 F (37 C) 98.6 F (37 C)  TempSrc:  Oral Oral   SpO2: 100% 96% 100% 98%  Weight:   73 kg   Height:        Intake/Output Summary (Last 24 hours) at 06/28/2023 1429 Last data filed at  06/28/2023 0865 Gross per 24 hour  Intake 2268.93 ml  Output 500 ml  Net 1768.93 ml   Filed Weights   06/23/23 1758 06/28/23 0426  Weight: 63.5 kg 73 kg    Data Reviewed: I have personally reviewed and interpreted daily labs, tele strips, imagings as discussed above. I reviewed all nursing notes, pharmacy notes, vitals, pertinent old records I have discussed plan of care as described above with RN and patient/family.  CBC: Recent Labs  Lab 06/23/23 1801 06/24/23 0519 06/25/23 0433 06/26/23 0938 06/27/23 0513  WBC 14.5* 11.8* 10.8* 7.6 7.6  HGB 16.5 13.9 12.7* 10.6* 10.6*  HCT 49.1 39.8 36.2* 31.2* 30.7*  MCV 86.7 84.1 84.4 87.4 86.5  PLT 301 253 243 235 270   Basic Metabolic Panel: Recent Labs  Lab 06/24/23 0519 06/25/23 0433 06/26/23 0938 06/27/23 0513 06/28/23 0321  NA 128* 125* 131* 133* 135  K 4.4 4.1 4.1 4.4 4.0  CL 95* 96* 103 105 103  CO2 24 21* 22 23 23   GLUCOSE 106* 107* 118* 125* 141*  BUN 14 10 11 13 13   CREATININE 0.88 0.70 0.58* 0.66 0.65  CALCIUM 7.8* 7.5* 7.6* 7.7* 7.9*  MG  --   --  2.0 1.9 1.7  PHOS  --   --  2.7 2.9 3.5    Studies: No results found.  Scheduled Meds:  bisacodyl  10 mg Oral QHS   Chlorhexidine Gluconate Cloth  6 each Topical Daily   cyanocobalamin  1,000 mcg Intramuscular Q1200   Followed by   Melene Muller ON 07/05/2023] vitamin B-12  1,000 mcg Oral Daily   enoxaparin (LOVENOX) injection  40 mg Subcutaneous Q24H   insulin aspart  0-9 Units Subcutaneous Q6H   octreotide  100 mcg Subcutaneous TID   pantoprazole  40 mg Oral Daily   polyethylene glycol  17 g Oral BID   sodium chloride flush  10-40 mL Intracatheter Q12H   thiamine (VITAMIN B1) injection  100 mg Intravenous Q24H   Continuous Infusions:  sodium chloride 50 mL/hr at 06/27/23 1912   cefTRIAXone (ROCEPHIN)  IV 2 g (06/28/23 1158)   iron sucrose 200 mg (06/28/23 0918)   TPN ADULT (ION) 75 mL/hr at 06/27/23 1913   TPN ADULT (ION)     PRN Meds: acetaminophen **OR**  acetaminophen, ketorolac, labetalol, magnesium hydroxide, morphine injection, ondansetron **OR** ondansetron (ZOFRAN) IV, mouth rinse, sodium chloride flush  Time spent: 35 minutes  Author: Gillis Santa. MD [facility] 06/28/2023 2:29 PM  To reach On-call, see care teams to locate the attending and reach out to them via [email or web address]. If 7PM-7AM, please contact night-coverage If you still have difficulty reaching the attending provider, please page the [facility] (Director on Call) for [facility] on amion for assistance.

## 2023-06-29 ENCOUNTER — Other Ambulatory Visit: Payer: Self-pay | Admitting: Student

## 2023-06-29 ENCOUNTER — Inpatient Hospital Stay: Payer: Medicaid Other

## 2023-06-29 DIAGNOSIS — K859 Acute pancreatitis without necrosis or infection, unspecified: Secondary | ICD-10-CM | POA: Diagnosis not present

## 2023-06-29 LAB — COMPREHENSIVE METABOLIC PANEL
ALT: 17 U/L (ref 0–44)
AST: 30 U/L (ref 15–41)
Albumin: 1.6 g/dL — ABNORMAL LOW (ref 3.5–5.0)
Alkaline Phosphatase: 73 U/L (ref 38–126)
Anion gap: 4 — ABNORMAL LOW (ref 5–15)
BUN: 11 mg/dL (ref 6–20)
CO2: 28 mmol/L (ref 22–32)
Calcium: 7.6 mg/dL — ABNORMAL LOW (ref 8.9–10.3)
Chloride: 101 mmol/L (ref 98–111)
Creatinine, Ser: 0.65 mg/dL (ref 0.61–1.24)
GFR, Estimated: >60 mL/min (ref >60)
Glucose, Bld: 145 mg/dL — ABNORMAL HIGH (ref 70–99)
Potassium: 4 mmol/L (ref 3.5–5.1)
Sodium: 133 mmol/L — ABNORMAL LOW (ref 135–145)
Total Bilirubin: 0.3 mg/dL (ref 0.3–1.2)
Total Protein: 4.4 g/dL — ABNORMAL LOW (ref 6.5–8.1)

## 2023-06-29 LAB — TRIGLYCERIDES: Triglycerides: 53 mg/dL (ref <150)

## 2023-06-29 LAB — LIPASE, BLOOD: Lipase: 222 U/L — ABNORMAL HIGH (ref 11–51)

## 2023-06-29 LAB — GLUCOSE, CAPILLARY
Glucose-Capillary: 154 mg/dL — ABNORMAL HIGH (ref 70–99)
Glucose-Capillary: 134 mg/dL — ABNORMAL HIGH (ref 70–99)
Glucose-Capillary: 133 mg/dL — ABNORMAL HIGH (ref 70–99)
Glucose-Capillary: 129 mg/dL — ABNORMAL HIGH (ref 70–99)

## 2023-06-29 LAB — ANAEROBIC CULTURE W GRAM STAIN

## 2023-06-29 LAB — MAGNESIUM: Magnesium: 1.9 mg/dL (ref 1.7–2.4)

## 2023-06-29 LAB — PHOSPHORUS: Phosphorus: 4 mg/dL (ref 2.5–4.6)

## 2023-06-29 MED ORDER — ALBUMIN HUMAN 25 % IV SOLN: 25.0000 g | Freq: Once | INTRAVENOUS | Status: DC | PRN

## 2023-06-29 MED ORDER — TRACE MINERALS CU-MN-SE-ZN 300-55-60-3000 MCG/ML IV SOLN
INTRAVENOUS | Status: AC
  Filled 2023-06-29: qty 744

## 2023-06-29 MED ORDER — LIDOCAINE HCL (PF) 1 % IJ SOLN
10.0000 mL | Freq: Once | INTRAMUSCULAR | Status: AC
  Administered 2023-06-29: 10 mL via SUBCUTANEOUS

## 2023-06-29 NOTE — Progress Notes (Signed)
Inpatient Follow-up/Progress Note   Patient ID: Shane Bozzi Sr. is a 37 y.o. male.  Overnight Events / Subjective Findings Pt reports pain improved. Abdomen is distended/taut. Has not attempted clear liquids yet. No fever/chills. Feels "sleepy." No n/v. No other acute gi complaints. Nurse is at bedside.  Review of Systems  Constitutional:  Positive for appetite change. Negative for activity change, chills, diaphoresis, fatigue, fever and unexpected weight change.  HENT:  Negative for trouble swallowing and voice change.   Respiratory:  Negative for shortness of breath and wheezing.   Cardiovascular:  Negative for chest pain, palpitations and leg swelling.  Gastrointestinal:  Positive for abdominal distention and abdominal pain. Negative for anal bleeding, blood in stool, constipation, diarrhea, nausea and vomiting.  Musculoskeletal:  Negative for arthralgias and myalgias.  Skin:  Negative for color change and pallor.  Neurological:  Negative for dizziness, syncope and weakness.  Psychiatric/Behavioral:  Negative for confusion. The patient is not nervous/anxious.   All other systems reviewed and are negative.    Medications  Current Facility-Administered Medications:    acetaminophen (TYLENOL) tablet 650 mg, 650 mg, Oral, Q6H PRN **OR** acetaminophen (TYLENOL) suppository 650 mg, 650 mg, Rectal, Q6H PRN, Mansy, Jan A, MD   bisacodyl (DULCOLAX) EC tablet 10 mg, 10 mg, Oral, QHS, Lucianne Muss, Dileep, MD   cefTRIAXone (ROCEPHIN) 2 g in sodium chloride 0.9 % 100 mL IVPB, 2 g, Intravenous, Q24H, Woodard, Janice B, PA-C, Last Rate: 200 mL/hr at 06/28/23 1158, 2 g at 06/28/23 1158   Chlorhexidine Gluconate Cloth 2 % PADS 6 each, 6 each, Topical, Daily, Djan, Prince T, MD, 6 each at 06/29/23 1001   cyanocobalamin (VITAMIN B12) injection 1,000 mcg, 1,000 mcg, Intramuscular, Q1200, 1,000 mcg at 06/29/23 1002 **FOLLOWED BY** [START ON 07/05/2023] cyanocobalamin (VITAMIN B12) tablet 1,000 mcg, 1,000 mcg,  Oral, Daily, Lucianne Muss, Dileep, MD   enoxaparin (LOVENOX) injection 40 mg, 40 mg, Subcutaneous, Q24H, Pabon, Diego F, MD, 40 mg at 06/29/23 1001   insulin aspart (novoLOG) injection 0-9 Units, 0-9 Units, Subcutaneous, Q6H, Gillis Santa, MD, 2 Units at 06/29/23 0526   iron sucrose (VENOFER) 200 mg in sodium chloride 0.9 % 100 mL IVPB, 200 mg, Intravenous, Daily, Gillis Santa, MD, Last Rate: 440 mL/hr at 06/29/23 1006, 200 mg at 06/29/23 1006   labetalol (NORMODYNE) injection 20 mg, 20 mg, Intravenous, Q3H PRN, Mansy, Jan A, MD, 20 mg at 06/23/23 2145   magnesium hydroxide (MILK OF MAGNESIA) suspension 30 mL, 30 mL, Oral, Daily PRN, Mansy, Jan A, MD   morphine (PF) 2 MG/ML injection 2 mg, 2 mg, Intravenous, Q2H PRN, Rosezetta Schlatter T, MD, 2 mg at 06/29/23 0820   octreotide (SANDOSTATIN) injection 100 mcg, 100 mcg, Subcutaneous, TID, Lynden Oxford R, PA-C, 100 mcg at 06/29/23 1008   ondansetron (ZOFRAN) tablet 4 mg, 4 mg, Oral, Q6H PRN **OR** ondansetron (ZOFRAN) injection 4 mg, 4 mg, Intravenous, Q6H PRN, Mansy, Jan A, MD   Oral care mouth rinse, 15 mL, Mouth Rinse, PRN, Djan, Scarlette Calico, MD   pantoprazole (PROTONIX) EC tablet 40 mg, 40 mg, Oral, Daily, Gillis Santa, MD, 40 mg at 06/29/23 1001   polyethylene glycol (MIRALAX / GLYCOLAX) packet 17 g, 17 g, Oral, BID, Gillis Santa, MD, 17 g at 06/28/23 0913   sodium chloride flush (NS) 0.9 % injection 10-40 mL, 10-40 mL, Intracatheter, Q12H, Djan, Prince T, MD, 10 mL at 06/29/23 1003   sodium chloride flush (NS) 0.9 % injection 10-40 mL, 10-40 mL, Intracatheter, PRN, Rosezetta Schlatter  T, MD   [COMPLETED] thiamine (VITAMIN B1) injection 100 mg, 100 mg, Intravenous, Once, 100 mg at 06/26/23 1241 **FOLLOWED BY** thiamine (VITAMIN B1) injection 100 mg, 100 mg, Intravenous, Q24H, Effie Shy, RPH, 100 mg at 06/28/23 1153   TPN ADULT (ION), , Intravenous, Continuous TPN, Ronnald Ramp, RPH, Last Rate: 75 mL/hr at 06/28/23 1802, New Bag at 06/28/23 1802   TPN  ADULT (ION), , Intravenous, Continuous TPN, Gillis Santa, MD   traZODone (DESYREL) tablet 50 mg, 50 mg, Oral, QHS PRN, Andris Baumann, MD, 50 mg at 06/28/23 2354  cefTRIAXone (ROCEPHIN)  IV 2 g (06/28/23 1158)   iron sucrose 200 mg (06/29/23 1006)   TPN ADULT (ION) 75 mL/hr at 06/28/23 1802   TPN ADULT (ION)      acetaminophen **OR** acetaminophen, labetalol, magnesium hydroxide, morphine injection, ondansetron **OR** ondansetron (ZOFRAN) IV, mouth rinse, sodium chloride flush, traZODone   Objective    Vitals:   06/28/23 1629 06/28/23 1914 06/29/23 0321 06/29/23 0837  BP: 130/80 131/81 127/79 132/88  Pulse: 98 88 82 82  Resp: 18 16 16 19   Temp: 98.5 F (36.9 C) 98.5 F (36.9 C) 98.2 F (36.8 C) 98.9 F (37.2 C)  TempSrc:  Oral Oral Oral  SpO2: 96% 96% 98% 97%  Weight:   71 kg   Height:         Physical Exam Vitals and nursing note reviewed.  Constitutional:      General: He is not in acute distress.    Appearance: He is not ill-appearing, toxic-appearing or diaphoretic.  HENT:     Head: Normocephalic and atraumatic.     Nose: Nose normal.     Mouth/Throat:     Mouth: Mucous membranes are moist.     Pharynx: Oropharynx is clear.  Eyes:     General: No scleral icterus.    Extraocular Movements: Extraocular movements intact.  Cardiovascular:     Rate and Rhythm: Normal rate and regular rhythm.     Heart sounds: Normal heart sounds. No murmur heard.    No friction rub. No gallop.  Pulmonary:     Effort: Pulmonary effort is normal. No respiratory distress.     Breath sounds: Normal breath sounds. No wheezing, rhonchi or rales.  Abdominal:     General: Bowel sounds are normal. There is distension.     Palpations: Abdomen is soft.     Tenderness: There is abdominal tenderness (mild). There is no guarding or rebound.  Musculoskeletal:     Cervical back: Neck supple.     Right lower leg: No edema.     Left lower leg: No edema.  Skin:    General: Skin is warm and  dry.     Coloration: Skin is not jaundiced or pale.  Neurological:     General: No focal deficit present.     Mental Status: He is alert and oriented to person, place, and time. Mental status is at baseline.  Psychiatric:        Mood and Affect: Mood normal.        Behavior: Behavior normal.        Thought Content: Thought content normal.        Judgment: Judgment normal.      Laboratory Data Recent Labs  Lab 06/25/23 0433 06/26/23 0938 06/27/23 0513  WBC 10.8* 7.6 7.6  HGB 12.7* 10.6* 10.6*  HCT 36.2* 31.2* 30.7*  PLT 243 235 270   Recent Labs  Lab 06/26/23  0272 06/27/23 0513 06/28/23 0321 06/29/23 0509  NA 131* 133* 135 133*  K 4.1 4.4 4.0 4.0  CL 103 105 103 101  CO2 22 23 23 28   BUN 11 13 13 11   CREATININE 0.58* 0.66 0.65 0.65  CALCIUM 7.6* 7.7* 7.9* 7.6*  PROT 4.3* 4.5*  --  4.4*  BILITOT 0.2* 0.3  --  0.3  ALKPHOS 25* 29*  --  73  ALT 7 9  --  17  AST 12* 14*  --  30  GLUCOSE 118* 125* 141* 145*   Recent Labs  Lab 06/23/23 1856 06/25/23 0433  INR 1.1 1.2      Imaging Studies: No results found.  Assessment:   # Pancreatitis with pseudocyst formation with pancreatic ascites - presence of amylase in ascites, can be sign of ductal disruption - patient's pain is currently improving - on TPN per primary team  # abdominal pain 2/2 above  # Hyponatremia - presently 133  # EtOH Dependence- in remission  # Chronic anemia- stable in comparison to February 2024 - no signs of GIB  # protein calorie malnutrition  Plan:  Recommend starting clear liquid diet to assess tolerance. Ideally off of TPN as soon as possible. Advance as tolerated with plan for eventual low fat diet Recommend repeat paracentesis with studies Continue abx for total of 7 days Management and correction of electrolytes as per primary team and pharmacist Recommend repeat CT in ~ 4 weeks to assess pseudocysts/pancreatic duct Symptomatic support including pain control and anti  emetics as per primary team Continued etoh and tobacco cessation Continue bowel regimen  Management of other medical comorbidities as per primary team  I personally performed the service.  Thank you for allowing [place] to participate in this patient's care. Please don't hesitate to call if any questions or concerns arise.   Jaynie Collins, DO [facility]  Portions of the record may have been created with voice recognition software. Occasional wrong-word or 'sound-a-like' substitutions may have occurred due to the inherent limitations of voice recognition software.  Read the chart carefully and recognize, using context, where substitutions may have occurred.

## 2023-06-29 NOTE — Consult Note (Addendum)
PHARMACY - TOTAL PARENTERAL NUTRITION CONSULT NOTE   Indication:  Pancreatitis ascites with abscess. NPO  Patient Measurements: Height: 6\' 1"  (185.4 cm) Weight: 71 kg (156 lb 8.4 oz) IBW/kg (Calculated) : 79.9 TPN AdjBW (KG): 63.5 Body mass index is 20.65 kg/m. Usual Weight: 63.5  Assessment:  Shane Jenkins is a 37 yo males with recurrent alcohol pancreatitis with a hx of alcohol abuse (reported last drink 6 months ago). Patient has a past medical history of pancreatic pseudocysts (stable, small, chronic) and hyponatremia. Pharmacy was consulted by gastro to start TPN on this patient given their complicated diagnosis - pancreatic ascites.   Glucose / Insulin: BG 154 (Novolog 4 unit in the last 24 hours.  Electrolytes: 8/15: WNL Renal: SCr Stable at baseline (0.7) Hepatic: WNL, Lipase 299 >> 522 > 452 > 222 Intake / Output; MIVF: positive 12.5 L GI Imaging: 8/15: moderate volume ascites and mesenteric edema.  GI Surgeries / Procedures: 8/14 Paracentesis with 4.2 L of fluid removed   Central access: 8/15 TPN start date: 8/15  Nutritional Goals: Goal TPN rate is 75 mL/hr (see components below)  RD Assessment: Estimated Needs Total Energy Estimated Needs: 2100-2400kcal/day Total Protein Estimated Needs: 105-120g/day Total Fluid Estimated Needs: 1.9-2.2L/day  Current Nutrition:  NPO  Plan:  Continue TPN at 75 mL/hr (goal rate) at 1800  Clinisol, 15% Dextrose, 19% Lipid, 38 g/L Protein, 111.6 g/day Kcal, 2200 kcal/day Electrolytes in TPN: Na 21mEq/L, K 77mEq/L, Ca 44mEq/L, Mg 57mEq/L, and Phos 73mmol/L. Cl:Ac 1:1 Add standard MVI and trace elements to TPN Continue fluids NS@50  due to pancreatitis  Initiate Sensitive q8h SSI and adjust as needed  Monitor TPN labs daily until clinically indicated to monitor only Monday & Thursday.   Ronnald Ramp. PharmD, BCPS 06/29/2023,8:59 AM

## 2023-06-29 NOTE — Progress Notes (Signed)
[facility] Progress Note  Patient: Shane Jenkins    QMV:[id number]  DOA: 06/23/2023     Date of Service: the patient was seen and examined on 06/29/2023  Chief Complaint  Patient presents with   Abdominal Pain   Brief hospital course: Shane Braam Sr. is a 37 y.o. Caucasian male with medical history significant for essential hypertension, alcohol abuse, currently sober for 6 months and alcoholic pancreatitis with pancreatic insufficiency, who presented to the emergency room with acute onset of worsening abdominal pain over the last week mainly in the epigastric area in the left upper quadrant, which significantly deteriorated today with associated nausea without vomiting.  He has been having constipation but is passing flatus.  No fever or chills.  No dysuria, oliguria or hematuria or flank pain.   ED Course: When he came to the ER, BP was 147/100 with respiratory to 24 with otherwise normal vital signs.  Labs revealed hyponatremia 124 and above chloremia of 88 with CO2 of 19 and glucose 124, calcium 8.6 and anion gap of 17.  Albumin was 2.8 and total bili 1.5.  Serum lipase came back elevated at 386.  Lactic acid was 2 and CBC showed leukocytosis of 14.5.  Coagulation profile was normal.  Respiratory panel including COVID-19 PCR came back negative.  Blood cultures were drawn.      EKG as reviewed by me : EKG showed sinus tachycardia with rate 150 with right atrial enlargement and minimal voltage criteria for LVH with peaked T waves and true lateral leads and T wave inversion inferiorly.    Imaging: Abdominal and pelvic CT scan with contrast revealed the following: Possible mild acute pancreatitis, equivocal. Correlate with laboratory evaluation.   Associated small pseudocysts measuring up to 17 mm in the uncinate process, mildly progressive.   Moderate abdominopelvic ascites, progressive. No drainable walled-off fluid collection.   Assessment and Plan:  #Acute pancreatitis with   ascites according to patient he has not used any alcohol within the last 6 months Continue current IV fluid We will keep n.p.o. Continue current pain regiment We appreciate the input of surgery as well as gastroenterology team CT scan of the abdomen showed I personally reviewed the patient's CT scan of the abdomen showed findings of acute pancreatitis as well as pancreatic pseudocyst S/p paracentesis 4.2 L of ascitic fluid removed on 06/24/2023 Analysis of ascitic fluid shows possible pancreatic source other than cirrhosis. Plan of care discussed with surgeon as well as gastroenterologist at this point with recommendation for TPN and octreotide. We have discussed the case with interventionist who recommended TPN and octreotide and if after some time there is no improvement then patient will likely need stent placement. Patient will need transfer to tertiary facility [facility] or [facility] Lipase [phone]  --[phone] 8/19 GI started clear liquid diet, gradually advance to low-fat diet, continue antibiotics for 7 days, repeat paracentesis with ascites, repeat CT abdomen pelvis after 4 weeks to assess pseudocyst and pancreatic duct. Follow IR for repeat paracentesis and follow fluid studies.  #Hypertensive urgency - Elevated BP could be secondary to pain and stress - Continue losartan - Continue as needed IV labetalol.   # Hypotonic hyponatremia Most likely due to volume overload Patient is n.p.o., continue NS Na 133 Monitor sodium level daily    # Pancreatic insufficiency - Resume Creon when diet will be resumed   # Tobacco abuse - Smoking cessation counseling done.  # Constipation, started laxatives.  # Iron deficiency, iron level is 8,  could not calculate transferrin saturation. Started Venofer 200 mg IV daily x 5 days followed by oral supplement on discharge. Follow-up with PCP to repeat iron profile after 3 to 6 months.  # Vitamin B12 level 234, goal >400.  Started vitamin  B12 1000 mcg IM injection for 7 days followed by oral supplement.   Body mass index is 20.65 kg/m.  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  The pt provided permission to discuss medical plan with the family. Opportunity was given to ask question and all questions were answered satisfactorily.   Disposition:  Pt is from Home, admitted with acute pancreatitis and pseudocyst, still n.p.o. on TPN, which precludes a safe discharge. Discharge to home versus transfer to higher level of care for pancreatic stent insertion if no improvement as per general surgery.  Follow GI for further management.  Complicated case, may require few days more to improve.  Subjective: No significant events overnight, patient does not have any abdominal pain at rest, has generalized tenderness and feels abdomen is more tense as compared to yesterday.  May need paracentesis.  Denied any worsening of shortness of breath, no chest pain or palpitation, no any other active issues.  Patient is moving bowels.  Physical Exam: General: NAD, lying comfortably Appear in no distress, affect appropriate Eyes: PERRLA ENT: Oral Mucosa Clear, moist  Neck: no JVD,  Cardiovascular: S1 and S2 Present, no Murmur,  Respiratory: good respiratory effort, Bilateral Air entry equal and Decreased, no Crackles, no wheezes Abdomen: BS positive, distended due to ascites, mild generalized tenderness. Skin: no rashes Extremities: no Pedal edema, no calf tenderness Neurologic: without any new focal findings Gait not checked due to patient safety concerns  Vitals:   06/28/23 1629 06/28/23 1914 06/29/23 0321 06/29/23 0837  BP: 130/80 131/81 127/79 132/88   Pulse: 98 88 82 82  Resp: 18 16 16 19   Temp: 98.5 F (36.9 C) 98.5 F (36.9 C) 98.2 F (36.8 C) 98.9 F (37.2 C)  TempSrc:  Oral Oral Oral  SpO2: 96% 96% 98% 97%  Weight:   71 kg   Height:        Intake/Output Summary (Last 24 hours) at 06/29/2023 1439 Last data filed at 06/29/2023 1050 Gross per 24 hour  Intake 2124.34 ml  Output 400 ml  Net 1724.34 ml   Filed Weights   06/23/23 1758 06/28/23 0426 06/29/23 0321  Weight: 63.5 kg 73 kg 71 kg    Data Reviewed: I have personally reviewed and interpreted daily labs, tele strips, imagings as discussed above. I reviewed all nursing notes, pharmacy notes, vitals, pertinent old records I have discussed plan of care as described above with RN and patient/family.  CBC: Recent Labs  Lab 06/23/23 1801 06/24/23 0519 06/25/23 0433 06/26/23 0938 06/27/23 0513  WBC 14.5* 11.8* 10.8* 7.6 7.6  HGB 16.5 13.9 12.7* 10.6* 10.6*  HCT 49.1 39.8 36.2* 31.2* 30.7*  MCV 86.7 84.1 84.4 87.4 86.5  PLT 301 253 243 235 270   Basic Metabolic Panel: Recent Labs  Lab 06/25/23 0433 06/26/23 0938 06/27/23 0513 06/28/23 0321 06/29/23 0509  NA 125* 131* 133* 135 133*  K 4.1 4.1 4.4 4.0 4.0  CL 96* 103 105 103 101  CO2 21* 22 23 23  28  GLUCOSE 107* 118* 125* 141* 145*  BUN 10 11 13 13 11   CREATININE 0.70 0.58* 0.66 0.65 0.65  CALCIUM 7.5* 7.6* 7.7* 7.9* 7.6*  MG  --  2.0 1.9 1.7 1.9  PHOS  --  2.7 2.9 3.5 4.0    Studies: No results found.  Scheduled Meds:  bisacodyl  10 mg Oral QHS   Chlorhexidine Gluconate Cloth  6 each Topical Daily   cyanocobalamin  1,000 mcg Intramuscular Q1200   Followed by   Melene Muller ON 07/05/2023] vitamin B-12  1,000 mcg Oral Daily   enoxaparin (LOVENOX) injection  40 mg Subcutaneous Q24H   insulin aspart  0-9 Units Subcutaneous Q6H   octreotide  100 mcg Subcutaneous TID   pantoprazole  40 mg Oral Daily   polyethylene glycol  17 g Oral BID   sodium chloride flush  10-40 mL Intracatheter Q12H   thiamine  (VITAMIN B1) injection  100 mg Intravenous Q24H   Continuous Infusions:  albumin human     cefTRIAXone (ROCEPHIN)  IV 2 g (06/29/23 1053)   iron sucrose 200 mg (06/29/23 1006)   TPN ADULT (ION) 75 mL/hr at 06/28/23 1802   TPN ADULT (ION)     PRN Meds: acetaminophen **OR** acetaminophen, albumin human, labetalol, magnesium hydroxide, morphine injection, ondansetron **OR** ondansetron (ZOFRAN) IV, mouth rinse, sodium chloride flush, traZODone  Time spent: 35 minutes  Author: Gillis Santa. MD [facility] 06/29/2023 2:39 PM  To reach On-call, see care teams to locate the attending and reach out to them via [email or web address]. If 7PM-7AM, please contact night-coverage If you still have difficulty reaching the attending provider, please page the [facility] (Director on Call) for [facility] on amion for assistance.

## 2023-06-30 DIAGNOSIS — K859 Acute pancreatitis without necrosis or infection, unspecified: Secondary | ICD-10-CM | POA: Diagnosis not present

## 2023-06-30 LAB — ALBUMIN, PLEURAL OR PERITONEAL FLUID: Albumin, Fluid: <1.5 g/dL

## 2023-06-30 LAB — BODY FLUID CELL COUNT WITH DIFFERENTIAL
Eos, Fluid: 0 %
Lymphs, Fluid: 14 %
Monocyte-Macrophage-Serous Fluid: 12 %
Neutrophil Count, Fluid: 74 %
Total Nucleated Cell Count, Fluid: 3617 uL

## 2023-06-30 LAB — GLUCOSE, CAPILLARY
Glucose-Capillary: 122 mg/dL — ABNORMAL HIGH (ref 70–99)
Glucose-Capillary: 161 mg/dL — ABNORMAL HIGH (ref 70–99)
Glucose-Capillary: 127 mg/dL — ABNORMAL HIGH (ref 70–99)
Glucose-Capillary: 106 mg/dL — ABNORMAL HIGH (ref 70–99)

## 2023-06-30 LAB — LIPASE, BLOOD: Lipase: 140 U/L — ABNORMAL HIGH (ref 11–51)

## 2023-06-30 LAB — AMYLASE, PLEURAL OR PERITONEAL FLUID: Amylase, Fluid: 7651 U/L

## 2023-06-30 LAB — CBC
HCT: 33.7 % — ABNORMAL LOW (ref 39.0–52.0)
Hemoglobin: 10.4 g/dL — ABNORMAL LOW (ref 13.0–17.0)
MCH: 31.3 pg (ref 26.0–34.0)
MCHC: 30.9 g/dL (ref 30.0–36.0)
MCV: 101.5 fL — ABNORMAL HIGH (ref 80.0–100.0)
Platelets: 338 10*3/uL (ref 150–400)
RBC: 3.32 MIL/uL — ABNORMAL LOW (ref 4.22–5.81)
RDW: 14.1 % (ref 11.5–15.5)
WBC: 7.6 10*3/uL (ref 4.0–10.5)
nRBC: 0 % (ref 0.0–0.2)

## 2023-06-30 LAB — MISC LABCORP TEST (SEND OUT)

## 2023-06-30 LAB — AMYLASE

## 2023-06-30 LAB — ALBUMIN

## 2023-06-30 MED ORDER — ENSURE ENLIVE PO LIQD
237.0000 mL | Freq: Three times a day (TID) | ORAL | Status: DC
  Administered 2023-06-30 – 2023-07-05 (×7): 237 mL via ORAL

## 2023-06-30 MED ORDER — TRACE MINERALS CU-MN-SE-ZN 300-55-60-3000 MCG/ML IV SOLN
INTRAVENOUS | Status: AC
  Filled 2023-06-30: qty 744

## 2023-06-30 NOTE — Plan of Care (Signed)
  Problem: Safety: Goal: Ability to remain free from injury will improve Outcome: Progressing   Problem: Pain Managment: Goal: General experience of comfort will improve Outcome: Progressing   Problem: Elimination: Goal: Will not experience complications related to bowel motility Outcome: Progressing   Problem: Education: Goal: Ability to describe self-care measures that may prevent or decrease complications (Diabetes Survival Skills Education) will improve Outcome: Progressing

## 2023-06-30 NOTE — Progress Notes (Addendum)
Inpatient Follow-up/Progress Note   Patient ID: Shane Roulhac Sr. is a 37 y.o. male.  Overnight Events / Subjective Findings NAEON. Able to tolerate clear liquids yesterday. Abdominal pain continues to improve. Underwent paracentesis yesterday with 2.9L removed. PMNs increasing and amylase remains high. No n/v. No other acute gi complaints.   Review of Systems  Constitutional:  Negative for activity change, appetite change, chills, diaphoresis, fatigue, fever and unexpected weight change.  HENT:  Negative for trouble swallowing and voice change.   Respiratory:  Negative for shortness of breath and wheezing.   Cardiovascular:  Negative for chest pain, palpitations and leg swelling.  Gastrointestinal:  Negative for abdominal distention, abdominal pain, anal bleeding, blood in stool, constipation, diarrhea, nausea and vomiting.  Musculoskeletal:  Negative for arthralgias and myalgias.  Skin:  Negative for color change and pallor.  Neurological:  Negative for dizziness, syncope and weakness.  Psychiatric/Behavioral:  Negative for confusion. The patient is not nervous/anxious.   All other systems reviewed and are negative.    Medications  Current Facility-Administered Medications:    acetaminophen (TYLENOL) tablet 650 mg, 650 mg, Oral, Q6H PRN **OR** acetaminophen (TYLENOL) suppository 650 mg, 650 mg, Rectal, Q6H PRN, Mansy, Jan A, MD   albumin human 25 % solution 25 g, 25 g, Intravenous, Once PRN, Jaynie Collins, DO   bisacodyl (DULCOLAX) EC tablet 10 mg, 10 mg, Oral, QHS, Gillis Santa, MD   cefTRIAXone (ROCEPHIN) 2 g in sodium chloride 0.9 % 100 mL IVPB, 2 g, Intravenous, Q24H, Woodard, Janice B, PA-C, Last Rate: 200 mL/hr at 06/29/23 1053, 2 g at 06/29/23 1053   Chlorhexidine Gluconate Cloth 2 % PADS 6 each, 6 each, Topical, Daily, Djan, Prince T, MD, 6 each at 06/29/23 1001   cyanocobalamin (VITAMIN B12) injection 1,000 mcg, 1,000 mcg, Intramuscular, Q1200, 1,000 mcg at 06/29/23  1002 **FOLLOWED BY** [START ON 07/05/2023] cyanocobalamin (VITAMIN B12) tablet 1,000 mcg, 1,000 mcg, Oral, Daily, Lucianne Muss, Dileep, MD   enoxaparin (LOVENOX) injection 40 mg, 40 mg, Subcutaneous, Q24H, Pabon, Diego F, MD, 40 mg at 06/29/23 1001   insulin aspart (novoLOG) injection 0-9 Units, 0-9 Units, Subcutaneous, Q6H, Gillis Santa, MD, 1 Units at 06/30/23 0615   iron sucrose (VENOFER) 200 mg in sodium chloride 0.9 % 100 mL IVPB, 200 mg, Intravenous, Daily, Gillis Santa, MD, Last Rate: 440 mL/hr at 06/29/23 1006, 200 mg at 06/29/23 1006   labetalol (NORMODYNE) injection 20 mg, 20 mg, Intravenous, Q3H PRN, Mansy, Jan A, MD, 20 mg at 06/23/23 2145   magnesium hydroxide (MILK OF MAGNESIA) suspension 30 mL, 30 mL, Oral, Daily PRN, Mansy, Jan A, MD   morphine (PF) 2 MG/ML injection 2 mg, 2 mg, Intravenous, Q2H PRN, Rosezetta Schlatter T, MD, 2 mg at 06/30/23 0433   octreotide (SANDOSTATIN) injection 100 mcg, 100 mcg, Subcutaneous, TID, Donovan Kail, PA-C, 100 mcg at 06/29/23 2219   ondansetron (ZOFRAN) tablet 4 mg, 4 mg, Oral, Q6H PRN **OR** ondansetron (ZOFRAN) injection 4 mg, 4 mg, Intravenous, Q6H PRN, Mansy, Jan A, MD   Oral care mouth rinse, 15 mL, Mouth Rinse, PRN, Djan, Scarlette Calico, MD   pantoprazole (PROTONIX) EC tablet 40 mg, 40 mg, Oral, Daily, Gillis Santa, MD, 40 mg at 06/29/23 1001   polyethylene glycol (MIRALAX / GLYCOLAX) packet 17 g, 17 g, Oral, BID, Gillis Santa, MD, 17 g at 06/28/23 0913   sodium chloride flush (NS) 0.9 % injection 10-40 mL, 10-40 mL, Intracatheter, Q12H, Loyce Dys, MD, 10 mL at 06/29/23 2104  sodium chloride flush (NS) 0.9 % injection 10-40 mL, 10-40 mL, Intracatheter, PRN, Meriam Sprague, Scarlette Calico, MD   [COMPLETED] thiamine (VITAMIN B1) injection 100 mg, 100 mg, Intravenous, Once, 100 mg at 06/26/23 1241 **FOLLOWED BY** thiamine (VITAMIN B1) injection 100 mg, 100 mg, Intravenous, Q24H, Effie Shy, RPH, 100 mg at 06/29/23 1130   TPN ADULT (ION), , Intravenous, Continuous  TPN, Gillis Santa, MD, Last Rate: 75 mL/hr at 06/29/23 1749, New Bag at 06/29/23 1749   traZODone (DESYREL) tablet 50 mg, 50 mg, Oral, QHS PRN, Andris Baumann, MD, 50 mg at 06/28/23 2354  albumin human     cefTRIAXone (ROCEPHIN)  IV 2 g (06/29/23 1053)   iron sucrose 200 mg (06/29/23 1006)   TPN ADULT (ION) 75 mL/hr at 06/29/23 1749    acetaminophen **OR** acetaminophen, albumin human, labetalol, magnesium hydroxide, morphine injection, ondansetron **OR** ondansetron (ZOFRAN) IV, mouth rinse, sodium chloride flush, traZODone   Objective    Vitals:   06/29/23 2025 06/30/23 0411 06/30/23 0413 06/30/23 0731  BP: 134/86  129/87 121/80  Pulse: 83  75 78  Resp: 18  18 16   Temp: 98.8 F (37.1 C)  98.3 F (36.8 C) 98.3 F (36.8 C)  TempSrc: Oral  Oral Oral  SpO2: 96%  97% 97%  Weight:  73.2 kg    Height:         Physical Exam Vitals and nursing note reviewed.  Constitutional:      General: He is not in acute distress.    Appearance: He is not ill-appearing, toxic-appearing or diaphoretic.  HENT:     Head: Normocephalic and atraumatic.     Nose: Nose normal.     Mouth/Throat:     Mouth: Mucous membranes are moist.     Pharynx: Oropharynx is clear.  Eyes:     General: No scleral icterus.    Extraocular Movements: Extraocular movements intact.  Cardiovascular:     Rate and Rhythm: Normal rate and regular rhythm.     Heart sounds: Normal heart sounds. No murmur heard.    No friction rub. No gallop.  Pulmonary:     Effort: Pulmonary effort is normal. No respiratory distress.     Breath sounds: Normal breath sounds. No wheezing, rhonchi or rales.  Abdominal:     General: Bowel sounds are normal. There is no distension.     Palpations: Abdomen is soft.     Tenderness: There is no abdominal tenderness. There is no guarding or rebound.  Musculoskeletal:     Cervical back: Neck supple.     Right lower leg: No edema.     Left lower leg: No edema.  Skin:    General: Skin is  warm and dry.     Coloration: Skin is not jaundiced or pale.  Neurological:     General: No focal deficit present.     Mental Status: He is alert and oriented to person, place, and time. Mental status is at baseline.  Psychiatric:        Mood and Affect: Mood normal.        Behavior: Behavior normal.        Thought Content: Thought content normal.        Judgment: Judgment normal.      Laboratory Data Recent Labs  Lab 06/26/23 0938 06/27/23 0513 06/30/23 0607  WBC 7.6 7.6 7.6  HGB 10.6* 10.6* 10.4*  HCT 31.2* 30.7* 33.7*  PLT 235 270 338   Recent Labs  Lab  06/26/23 0938 06/27/23 0513 06/28/23 0321 06/29/23 0509  NA 131* 133* 135 133*  K 4.1 4.4 4.0 4.0  CL 103 105 103 101  CO2 22 23 23 28   BUN 11 13 13 11   CREATININE 0.58* 0.66 0.65 0.65  CALCIUM 7.6* 7.7* 7.9* 7.6*  PROT 4.3* 4.5*  --  4.4*  BILITOT 0.2* 0.3  --  0.3  ALKPHOS 25* 29*  --  73  ALT 7 9  --  17  AST 12* 14*  --  30  GLUCOSE 118* 125* 141* 145*   Recent Labs  Lab 06/23/23 1856 06/25/23 0433  INR 1.1 1.2      Imaging Studies: No results found.  Assessment:   # Pancreatitis with pseudocyst formation with pancreatic ascites - presence of amylase in ascites, can be sign of ductal disruption - repeat paracentesis demonstrates increasing pmn count and amylase (06/30/23) - patient's pain is currently improving - on octreotide - on TPN per primary team  # abdominal pain 2/2 above  # Hyponatremia - presently 133  # EtOH Dependence- in remission  # Chronic anemia- stable in comparison to February 2024 - no signs of GIB  # protein calorie malnutrition  Plan:  Tolerating clear liquid diet.  Ideally off of TPN as soon as possible. Advance as tolerated with plan for eventual low fat diet Repeat paracentesis reveals increasing pmn count and amylase - he does not clinically appear septic or with SBP. On ceftriaxone 2g at this time Concern for pancreatic duct disruption due to recurrent  ascites with amylase present. Suspect PMN increase from acute pancreatitis Normal serum cbc count. Gram stain and culture negative thus far  I do believe he would benefit from evaluation at tertiary care center such as [facility] or [facility] as pancreatic stenting may be indicated if continued disruption. If things worsen, he may even need surgery at a higher level center. I discussed transfer with the hospitalist team and the patient who is amenable.  Management and correction of electrolytes as per primary team and pharmacist Recommend repeat CT in ~ 4 weeks to assess pseudocysts/pancreatic duct Symptomatic support including pain control and anti emetics as per primary team Continued etoh and tobacco cessation Continue bowel regimen  GI here will continue to be available as needed. Will follow peripherally. Please call back with questions if needed.  Management of other medical comorbidities as per primary team  I personally performed the service.  Thank you for allowing [place] to participate in this patient's care. Please don't hesitate to call if any questions or concerns arise.   Jaynie Collins, DO [facility]  Portions of the record may have been created with voice recognition software. Occasional wrong-word or 'sound-a-like' substitutions may have occurred due to the inherent limitations of voice recognition software.  Read the chart carefully and recognize, using context, where substitutions may have occurred.

## 2023-06-30 NOTE — Plan of Care (Signed)
  Problem: Activity: Goal: Risk for activity intolerance will decrease Outcome: Progressing   Problem: Nutrition: Goal: Adequate nutrition will be maintained Outcome: Progressing   Problem: Coping: Goal: Level of anxiety will decrease Outcome: Progressing   

## 2023-06-30 NOTE — Consult Note (Signed)
PHARMACY - TOTAL PARENTERAL NUTRITION CONSULT NOTE   Indication:  Pancreatitis ascites with abscess. NPO  Patient Measurements: Height: 6\' 1"  (185.4 cm) Weight: 73.2 kg (161 lb 6 oz) IBW/kg (Calculated) : 79.9 TPN AdjBW (KG): 63.5 Body mass index is 21.29 kg/m. Usual Weight: 63.5  Assessment:  Shane Jenkins is a 37 yo males with recurrent alcohol pancreatitis with a hx of alcohol abuse (reported last drink 6 months ago). Patient has a past medical history of pancreatic pseudocysts (stable, small, chronic) and hyponatremia. Pharmacy was consulted by gastro to start TPN on this patient given their complicated diagnosis - pancreatic ascites.   Glucose / Insulin: BG 154 (Novolog 4 unit in the last 24 hours.  Electrolytes: 8/15: WNL Renal: SCr Stable at baseline (0.7) Hepatic: WNL, Lipase 299 >> 522 > 452 > 222 Intake / Output; MIVF: positive 12.5 L GI Imaging: 8/15: moderate volume ascites and mesenteric edema.  GI Surgeries / Procedures: 8/14 Paracentesis with 4.2 L of fluid removed   Central access: 8/15 TPN start date: 8/15  Nutritional Goals: Goal TPN rate is 75 mL/hr (see components below)  RD Assessment: Estimated Needs Total Energy Estimated Needs: 2100-2400kcal/day Total Protein Estimated Needs: 105-120g/day Total Fluid Estimated Needs: 1.9-2.2L/day  Current Nutrition:  Clear liquid diet  Plan: Follow up with GI regarding duration of TPN.  Continue TPN at 75 mL/hr (goal rate) at 1800  Clinisol, 15% Dextrose, 19% Lipid, 38 g/L Protein, 111.6 g/day Kcal, 2200 kcal/day Electrolytes in TPN: Na 92mEq/L, K 66mEq/L, Ca 46mEq/L, Mg 58mEq/L, and Phos 27mmol/L. Cl:Ac 1:1 Add standard MVI and trace elements to TPN Continue fluids NS@50  due to pancreatitis  Initiate Sensitive q8h SSI and adjust as needed  Monitor TPN labs daily until clinically indicated to monitor only Monday & Thursday.    Elliot Gurney, PharmD, BCPS Clinical Pharmacist  06/30/2023 11:22  AM

## 2023-06-30 NOTE — Progress Notes (Signed)
[facility] Progress Note  Patient: Shane Jenkins    GNF:[id number]  DOA: 06/23/2023     Date of Service: the patient was seen and examined on 06/30/2023  Chief Complaint  Patient presents with   Abdominal Pain   Brief hospital course: Shane Formoso Sr. is a 37 y.o. Caucasian male with medical history significant for essential hypertension, alcohol abuse, currently sober for 6 months and alcoholic pancreatitis with pancreatic insufficiency, who presented to the emergency room with acute onset of worsening abdominal pain over the last week mainly in the epigastric area in the left upper quadrant, which significantly deteriorated today with associated nausea without vomiting.  He has been having constipation but is passing flatus.  No fever or chills.  No dysuria, oliguria or hematuria or flank pain.   ED Course: When he came to the ER, BP was 147/100 with respiratory to 24 with otherwise normal vital signs.  Labs revealed hyponatremia 124 and above chloremia of 88 with CO2 of 19 and glucose 124, calcium 8.6 and anion gap of 17.  Albumin was 2.8 and total bili 1.5.  Serum lipase came back elevated at 386.  Lactic acid was 2 and CBC showed leukocytosis of 14.5.  Coagulation profile was normal.  Respiratory panel including COVID-19 PCR came back negative.  Blood cultures were drawn.      EKG as reviewed by me : EKG showed sinus tachycardia with rate 150 with right atrial enlargement and minimal voltage criteria for LVH with peaked T waves and true lateral leads and T wave inversion inferiorly.    Imaging: Abdominal and pelvic CT scan with contrast revealed the following: Possible mild acute pancreatitis, equivocal. Correlate with laboratory evaluation.   Associated small pseudocysts measuring up to 17 mm in the uncinate process, mildly progressive.   Moderate abdominopelvic ascites, progressive. No drainable walled-off fluid collection.   Assessment and Plan:  #Acute pancreatitis with   ascites according to patient he has not used any alcohol within the last 6 months Continue current IV fluid We will keep n.p.o. Continue current pain regiment We appreciate the input of surgery as well as gastroenterology team CT scan of the abdomen showed I personally reviewed the patient's CT scan of the abdomen showed findings of acute pancreatitis as well as pancreatic pseudocyst S/p paracentesis 4.2 L of ascitic fluid removed on 06/24/2023 Analysis of ascitic fluid shows possible pancreatic source other than cirrhosis. Plan of care discussed with surgeon as well as gastroenterologist at this point with recommendation for TPN and octreotide. We have discussed the case with interventionist who recommended TPN and octreotide and if after some time there is no improvement then patient will likely need stent placement. Patient will need transfer to tertiary facility [facility] or [facility] Lipase [phone]  --[phone]--[phone]--140 8/19 GI started clear liquid diet, gradually advance to low-fat diet, continue antibiotics for 7 days, repeat paracentesis with ascites, repeat CT abdomen pelvis after 4 weeks to assess pseudocyst and pancreatic duct.  8/20 GI recommended EUS and pancreatic stent and transferred to [facility] versus [facility] 8/20 s/p IR paracentesis 2.9 L fluid was tapped.  WBC 3617, neutrophil 74%, albumin <1.5, malaise 7651 elevated.  8/20 Patient got accepted at [facility] under IM service and GI will be consulted for EUS and pancreatic stent placement.  Patient is on waiting list, it may take a few days.   #Hypertensive urgency - Elevated BP could be secondary to pain and stress - Continue losartan - Continue as needed IV labetalol.   #  Hypotonic hyponatremia Most likely due to volume overload Patient is n.p.o., continue NS Na 133 Monitor sodium level daily    # Pancreatic insufficiency - Resume Creon when diet will be resumed   # Tobacco abuse - Smoking cessation counseling done.  #  Constipation, started laxatives.  # Iron deficiency, iron level is 8, could not calculate transferrin saturation. Started Venofer 200 mg IV daily x 4 doses given. oral supplement as an as an outpatient Follow-up with PCP to repeat iron profile after 3 to 6 months.  # Vitamin B12 level 234, goal >400.  Started vitamin B12 1000 mcg IM injection for 7 days followed by oral supplement.   Body mass index is 21.29 kg/m.  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  The pt provided permission to discuss medical plan with the family. Opportunity was given to ask question and all questions were answered satisfactorily.   Disposition:  Pt is from Home, admitted with acute pancreatitis and pseudocyst, still on TPN, and FLD, recurrent ascites.  GI recommended to transfer for EUS and pancreatic stent placement.  8/20 Patient got accepted at [facility] under IM service and GI will be consulted for EUS and pancreatic stent placement.  Patient is on waiting list, it may take a few days.   Subjective: No significant events overnight, patient abdominal pain is off and on, he was feeling fine and then said that his coming back again, right now pain is 7/10.  Denied any other complaints.  Physical Exam: General: NAD, lying comfortably Appear in no distress, affect appropriate Eyes: PERRLA ENT: Oral Mucosa Clear, moist  Neck: no JVD,  Cardiovascular: S1 and S2 Present, no Murmur,  Respiratory: good respiratory effort, Bilateral Air entry equal and Decreased, no Crackles, no wheezes Abdomen: BS positive, distended due to ascites, mild generalized tenderness. Skin: no rashes Extremities: no Pedal edema, no calf tenderness Neurologic:  without any new focal findings Gait not checked due to patient safety concerns  Vitals:   06/30/23 0411 06/30/23 0413 06/30/23 0731 06/30/23 1531  BP:  129/87 121/80 106/72  Pulse:  75 78 78  Resp:  18 16 18   Temp:  98.3 F (36.8 C) 98.3 F (36.8 C) (!) 97.2 F (36.2 C)  TempSrc:  Oral Oral   SpO2:  97% 97% 97%  Weight: 73.2 kg     Height:        Intake/Output Summary (Last 24 hours) at 06/30/2023 1616 Last data filed at 06/30/2023 1535 Gross per 24 hour  Intake 2756.96 ml  Output 2500 ml  Net 256.96 ml   Filed Weights   06/28/23 0426 06/29/23 0321 06/30/23 0411  Weight: 73 kg 71 kg 73.2 kg    Data Reviewed: I have personally reviewed and interpreted daily labs, tele strips, imagings as discussed above. I reviewed all nursing notes, pharmacy notes, vitals, pertinent old records I have discussed plan of care as described above with RN and patient/family.  CBC: Recent Labs  Lab 06/24/23 0519 06/25/23 0433 06/26/23 0938 06/27/23 0513 06/30/23 0607  WBC 11.8* 10.8* 7.6 7.6 7.6  HGB 13.9 12.7* 10.6* 10.6* 10.4*  HCT 39.8 36.2* 31.2* 30.7* 33.7*  MCV 84.1 84.4 87.4 86.5 101.5*  PLT 253 243 235 270 338   Basic Metabolic Panel: Recent  Labs  Lab 06/25/23 0433 06/26/23 0938 06/27/23 0513 06/28/23 0321 06/29/23 0509  NA 125* 131* 133* 135 133*  K 4.1 4.1 4.4 4.0 4.0  CL 96* 103 105 103 101  CO2 21* 22 23 23 28   GLUCOSE 107* 118* 125* 141* 145*  BUN 10 11 13 13 11   CREATININE 0.70 0.58* 0.66 0.65 0.65  CALCIUM 7.5* 7.6* 7.7* 7.9* 7.6*  MG  --  2.0 1.9 1.7 1.9  PHOS  --  2.7 2.9 3.5 4.0    Studies: No results found.  Scheduled Meds:  bisacodyl  10 mg Oral QHS   Chlorhexidine Gluconate Cloth  6 each Topical Daily   cyanocobalamin  1,000 mcg Intramuscular Q1200   Followed by   Melene Muller ON 07/05/2023] vitamin B-12  1,000 mcg Oral Daily   enoxaparin (LOVENOX) injection  40 mg Subcutaneous Q24H   insulin aspart  0-9 Units Subcutaneous Q6H   octreotide  100 mcg  Subcutaneous TID   pantoprazole  40 mg Oral Daily   polyethylene glycol  17 g Oral BID   sodium chloride flush  10-40 mL Intracatheter Q12H   thiamine (VITAMIN B1) injection  100 mg Intravenous Q24H   Continuous Infusions:  albumin human     cefTRIAXone (ROCEPHIN)  IV Stopped (06/30/23 1435)   iron sucrose Stopped (06/30/23 1002)   TPN ADULT (ION) 75 mL/hr at 06/30/23 1535   TPN ADULT (ION)     PRN Meds: acetaminophen **OR** acetaminophen, albumin human, labetalol, magnesium hydroxide, morphine injection, ondansetron **OR** ondansetron (ZOFRAN) IV, mouth rinse, sodium chloride flush, traZODone  Time spent: 55 minutes  Author: Gillis Santa. MD [facility] 06/30/2023 4:16 PM  To reach On-call, see care teams to locate the attending and reach out to them via [email or web address]. If 7PM-7AM, please contact night-coverage If you still have difficulty reaching the attending provider, please page the [facility] (Director on Call) for [facility] on amion for assistance.

## 2023-06-30 NOTE — Progress Notes (Signed)
Nutrition Follow Up Note   DOCUMENTATION CODES:   Non-severe (moderate) malnutrition in context of chronic illness  INTERVENTION:   TPN per pharmacy- currently at goal rate   Ensure Enlive po TID, each supplement provides 350 kcal and 20 grams of protein.  Daily weights  NUTRITION DIAGNOSIS:   Moderate Malnutrition related to chronic illness (pancreatitis, suspected EPI, etoh abuse) as evidenced by mild fat depletion, moderate muscle depletion. 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  -Pt s/p IR paracentesis today with 2.9 L output   Pt tolerating TPN at goal rate. Refeed labs stable. Triglycerides wnl. Pt initiated on a clear liquid diet yesterday and was advanced to full liquids today. RD will add Ensure supplements. Plan is for transfer to [facility].   Per chart, pt is up ~21lbs from his UBW.   Medications reviewed and include: dulcolax, B12, lovenox, insulin, octreotide, protonix, miralax, thiamine, ceftriaxone, TPN  Labs reviewed: Na 133(L), K 4.0 wnl, P 4.0 wnl, Mg 1.9 wnl Lipase- 140(H) Triglycerides 53- 8/19 Hgb 10.4(L), Hct 33.7(L) Cbgs- 127, 161, 122 x 24 hrs  Diet Order:    Diet Order             Diet full liquid Fluid consistency: Thin  Diet effective now                  EDUCATION NEEDS:   Education needs have been addressed  Skin:  Skin Assessment: Reviewed RN Assessment  Last BM:  8/19  Height:   Ht Readings from Last 1 Encounters:  06/23/23 6\' 1"  (1.854 m)    Weight:   Wt Readings from Last 1 Encounters:  06/30/23 73.2 kg    Ideal Body Weight:  83.6 kg  BMI:  Body mass index is 21.29  kg/m.  Estimated Nutritional Needs:   Kcal:  2100-2400kcal/day  Protein:  105-120g/day  Fluid:  1.9-2.2L/day  Betsey Holiday MS, RD, LDN Please refer to [facility] for RD and/or RD on-call/weekend/after hours pager

## 2023-07-01 DIAGNOSIS — K859 Acute pancreatitis without necrosis or infection, unspecified: Secondary | ICD-10-CM | POA: Diagnosis not present

## 2023-07-01 LAB — BASIC METABOLIC PANEL
Anion gap: 8 (ref 5–15)
BUN: 13 mg/dL (ref 6–20)
CO2: 29 mmol/L (ref 22–32)
Calcium: 8 mg/dL — ABNORMAL LOW (ref 8.9–10.3)
Chloride: 97 mmol/L — ABNORMAL LOW (ref 98–111)
Creatinine, Ser: 0.61 mg/dL (ref 0.61–1.24)
GFR, Estimated: >60 mL/min (ref >60)
Glucose, Bld: 124 mg/dL — ABNORMAL HIGH (ref 70–99)
Potassium: 4.4 mmol/L (ref 3.5–5.1)
Sodium: 134 mmol/L — ABNORMAL LOW (ref 135–145)

## 2023-07-01 LAB — GLUCOSE, CAPILLARY
Glucose-Capillary: 132 mg/dL — ABNORMAL HIGH (ref 70–99)
Glucose-Capillary: 123 mg/dL — ABNORMAL HIGH (ref 70–99)

## 2023-07-01 LAB — HEPATIC FUNCTION PANEL
ALT: 29 U/L (ref 0–44)
AST: 38 U/L (ref 15–41)
Albumin: 1.8 g/dL — ABNORMAL LOW (ref 3.5–5.0)
Alkaline Phosphatase: 64 U/L (ref 38–126)
Bilirubin, Direct: <0.1 mg/dL (ref 0.0–0.2)
Total Bilirubin: 0.3 mg/dL (ref 0.3–1.2)
Total Protein: 4.6 g/dL — ABNORMAL LOW (ref 6.5–8.1)

## 2023-07-01 LAB — LIPASE, BLOOD: Lipase: 80 U/L — ABNORMAL HIGH (ref 11–51)

## 2023-07-01 LAB — LIPASE, FLUID: Lipase-Fluid: >60600 U/L

## 2023-07-01 MED ORDER — HYDROCODONE-ACETAMINOPHEN 5-325 MG PO TABS
1.0000 | ORAL_TABLET | Freq: Four times a day (QID) | ORAL | Status: DC | PRN
  Administered 2023-07-01 – 2023-07-05 (×10): 2 via ORAL
  Filled 2023-07-01 (×11): qty 2

## 2023-07-01 MED ORDER — TRACE MINERALS CU-MN-SE-ZN 300-55-60-3000 MCG/ML IV SOLN
INTRAVENOUS | Status: AC
  Filled 2023-07-01: qty 744

## 2023-07-01 MED ORDER — VITAMIN B-12 1000 MCG PO TABS
1000.0000 ug | ORAL_TABLET | Freq: Every day | ORAL | Status: DC
  Administered 2023-07-01 – 2023-07-05 (×5): 1000 ug via ORAL
  Filled 2023-07-01 (×5): qty 1

## 2023-07-01 MED ORDER — PANCRELIPASE (LIP-PROT-AMYL) 12000-38000 UNITS PO CPEP
72000.0000 [IU] | ORAL_CAPSULE | Freq: Three times a day (TID) | ORAL | Status: DC
  Administered 2023-07-01 – 2023-07-05 (×9): 72000 [IU] via ORAL
  Filled 2023-07-01 (×12): qty 6

## 2023-07-01 NOTE — Progress Notes (Signed)
Inpatient Follow-up/Progress Note   Patient ID: Shane Toennies Sr. is a 37 y.o. male.  Overnight Events / Subjective Findings NAEON.  Tolerating advances in diet. No abdominal pain. Feels his abdomen is still soft and non-distended Hospitalist team is titrating TPN off. Waiting for evaluation at [facility]. No n/v. No other acute gi complaints.   Review of Systems  Constitutional:  Negative for activity change, appetite change, chills, diaphoresis, fatigue, fever and unexpected weight change.  HENT:  Negative for trouble swallowing and voice change.   Respiratory:  Negative for shortness of breath and wheezing.   Cardiovascular:  Negative for chest pain, palpitations and leg swelling.  Gastrointestinal:  Negative for abdominal distention, abdominal pain, anal bleeding, blood in stool, constipation, diarrhea, nausea and vomiting.  Musculoskeletal:  Negative for arthralgias and myalgias.  Skin:  Negative for color change and pallor.  Neurological:  Negative for dizziness, syncope and weakness.  Psychiatric/Behavioral:  Negative for confusion. The patient is not nervous/anxious.   All other systems reviewed and are negative.    Medications  Current Facility-Administered Medications:    acetaminophen (TYLENOL) tablet 650 mg, 650 mg, Oral, Q6H PRN **OR** acetaminophen (TYLENOL) suppository 650 mg, 650 mg, Rectal, Q6H PRN, Mansy, Jan A, MD   albumin human 25 % solution 25 g, 25 g, Intravenous, Once PRN, Jaynie Collins, DO   bisacodyl (DULCOLAX) EC tablet 10 mg, 10 mg, Oral, QHS, Gillis Santa, MD   Chlorhexidine Gluconate Cloth 2 % PADS 6 each, 6 each, Topical, Daily, Djan, Scarlette Calico, MD, 6 each at 07/01/23 1028   cyanocobalamin (VITAMIN B12) tablet 1,000 mcg, 1,000 mcg, Oral, Daily, Charise Killian, MD, 1,000 mcg at 07/01/23 1313   enoxaparin (LOVENOX) injection 40 mg, 40 mg, Subcutaneous, Q24H, Pabon, Diego F, MD, 40 mg at 07/01/23 1029   feeding supplement (ENSURE ENLIVE / ENSURE PLUS)  liquid 237 mL, 237 mL, Oral, TID BM, Gillis Santa, MD, 237 mL at 07/01/23 1028   HYDROcodone-acetaminophen (NORCO/VICODIN) 5-325 MG per tablet 1-2 tablet, 1-2 tablet, Oral, Q6H PRN, Charise Killian, MD, 2 tablet at 07/01/23 1028   insulin aspart (novoLOG) injection 0-9 Units, 0-9 Units, Subcutaneous, Q6H, Gillis Santa, MD, 1 Units at 07/01/23 1313   labetalol (NORMODYNE) injection 20 mg, 20 mg, Intravenous, Q3H PRN, Mansy, Jan A, MD, 20 mg at 06/23/23 2145   lipase/protease/amylase (CREON) capsule 72,000 Units, 72,000 Units, Oral, TID WC, Meegan, Eryn, RPH   magnesium hydroxide (MILK OF MAGNESIA) suspension 30 mL, 30 mL, Oral, Daily PRN, Mansy, Jan A, MD   morphine (PF) 2 MG/ML injection 2 mg, 2 mg, Intravenous, Q2H PRN, Rosezetta Schlatter T, MD, 2 mg at 07/01/23 0813   octreotide (SANDOSTATIN) injection 100 mcg, 100 mcg, Subcutaneous, TID, Lynden Oxford R, PA-C, 100 mcg at 07/01/23 1027   ondansetron (ZOFRAN) tablet 4 mg, 4 mg, Oral, Q6H PRN **OR** ondansetron (ZOFRAN) injection 4 mg, 4 mg, Intravenous, Q6H PRN, Mansy, Jan A, MD   Oral care mouth rinse, 15 mL, Mouth Rinse, PRN, Djan, Scarlette Calico, MD   pantoprazole (PROTONIX) EC tablet 40 mg, 40 mg, Oral, Daily, Gillis Santa, MD, 40 mg at 07/01/23 1028   polyethylene glycol (MIRALAX / GLYCOLAX) packet 17 g, 17 g, Oral, BID, Gillis Santa, MD, 17 g at 06/30/23 2134   sodium chloride flush (NS) 0.9 % injection 10-40 mL, 10-40 mL, Intracatheter, Q12H, Djan, Prince T, MD, 10 mL at 07/01/23 1028   sodium chloride flush (NS) 0.9 % injection 10-40 mL, 10-40 mL, Intracatheter,  PRN, Loyce Dys, MD   TPN ADULT (ION), , Intravenous, Continuous TPN, Jaynie Bream [facility], Last Rate: 75 mL/hr at 06/30/23 1937, Infusion Verify at 06/30/23 1937   TPN ADULT (ION), , Intravenous, Continuous TPN, Meegan, Eryn, RPH   traZODone (DESYREL) tablet 50 mg, 50 mg, Oral, QHS PRN, Andris Baumann, MD, 50 mg at 06/28/23 2354  albumin human     TPN ADULT (ION) 75 mL/hr at  06/30/23 1937   TPN ADULT (ION)      acetaminophen **OR** acetaminophen, albumin human, HYDROcodone-acetaminophen, labetalol, magnesium hydroxide, morphine injection, ondansetron **OR** ondansetron (ZOFRAN) IV, mouth rinse, sodium chloride flush, traZODone   Objective    Vitals:   06/30/23 1531 06/30/23 2025 07/01/23 0412 07/01/23 0756  BP: 106/72 114/74 106/73 112/76  Pulse: 78 75 77 72  Resp: 18 18 18 18   Temp: (!) 97.2 F (36.2 C) 98.2 F (36.8 C) 98.3 F (36.8 C) 98 F (36.7 C)  TempSrc:  Oral Oral   SpO2: 97% 97% 97% 100%  Weight:   70.3 kg   Height:         Physical Exam Vitals and nursing note reviewed.  Constitutional:      General: He is not in acute distress.    Appearance: He is not ill-appearing, toxic-appearing or diaphoretic.  HENT:     Head: Normocephalic and atraumatic.     Nose: Nose normal.     Mouth/Throat:     Mouth: Mucous membranes are moist.     Pharynx: Oropharynx is clear.  Eyes:     General: No scleral icterus.    Extraocular Movements: Extraocular movements intact.  Cardiovascular:     Rate and Rhythm: Normal rate and regular rhythm.     Heart sounds: Normal heart sounds. No murmur heard.    No friction rub. No gallop.  Pulmonary:     Effort: Pulmonary effort is normal. No respiratory distress.     Breath sounds: Normal breath sounds. No wheezing, rhonchi or rales.  Abdominal:     General: Bowel sounds are normal. There is no distension.     Palpations: Abdomen is soft.     Tenderness: There is no abdominal tenderness. There is no guarding or rebound.  Musculoskeletal:     Cervical back: Neck supple.     Right lower leg: No edema.     Left lower leg: No edema.  Skin:    General: Skin is warm and dry.     Coloration: Skin is not jaundiced or pale.  Neurological:     General: No focal deficit present.     Mental Status: He is alert and oriented to person, place, and time. Mental status is at baseline.  Psychiatric:        Mood and  Affect: Mood normal.        Behavior: Behavior normal.        Thought Content: Thought content normal.        Judgment: Judgment normal.      Laboratory Data Recent Labs  Lab 06/26/23 0938 06/27/23 0513 06/30/23 0607  WBC 7.6 7.6 7.6  HGB 10.6* 10.6* 10.4*  HCT 31.2* 30.7* 33.7*  PLT 235 270 338   Recent Labs  Lab 06/27/23 0513 06/28/23 0321 06/29/23 0509 07/01/23 0448  NA 133* 135 133* 134*  K 4.4 4.0 4.0 4.4  CL 105 103 101 97*  CO2 23 23 28 29   BUN 13 13 11 13   CREATININE 0.66 0.65 0.65 0.61  CALCIUM 7.7* 7.9* 7.6* 8.0*  PROT 4.5*  --  4.4* 4.6*  BILITOT 0.3  --  0.3 0.3  ALKPHOS 29*  --  73 64  ALT 9  --  17 29  AST 14*  --  30 38  GLUCOSE 125* 141* 145* 124*   Recent Labs  Lab 06/25/23 0433  INR 1.2      Imaging Studies: No results found.  Assessment:   # Pancreatitis with pseudocyst formation with pancreatic ascites - presence of amylase in ascites, can be sign of ductal disruption - repeat paracentesis demonstrates increasing pmn count and amylase (06/30/23) - patient's pain is currently improving - on octreotide - on TPN per primary team  # abdominal pain 2/2 above  # Hyponatremia - presently 133  # EtOH Dependence- in remission  # Chronic anemia- stable in comparison to February 2024 - no signs of GIB  # protein calorie malnutrition  Plan:  Tolerating advancing diet. Ideally off of TPN as soon as possible. Advance as tolerated with plan for eventual low fat diet TPN being titrated off- hospitalist team following this Continue octreotide Complete 7 day abx course Concern for pancreatic duct disruption due to recurrent ascites with amylase present. Suspect PMN increase from acute pancreatitis Normal serum cbc count. Gram stain and culture negative thus far; Repeat paracentesis as needed with studies  I do believe he would benefit from evaluation at tertiary care center such as [facility] or [facility] as pancreatic stenting may be indicated if  continued disruption. If things worsen, he may even need surgery at a higher level center. I discussed transfer with the hospitalist team and the patient who is amenable. Awaiting [facility] transfer  Management and correction of electrolytes as per primary team and pharmacist Recommend repeat CT in ~ 4 weeks to assess pseudocysts/pancreatic duct with Dr. Norma Fredrickson Symptomatic support including pain control and anti emetics as per primary team Continued etoh and tobacco cessation Continue bowel regimen  GI here will continue to be available as needed. Will follow peripherally. Please call back with questions if needed.  Management of other medical comorbidities as per primary team  I personally performed the service.  Thank you for allowing [place] to participate in this patient's care. Please don't hesitate to call if any questions or concerns arise.   Jaynie Collins, DO [facility]  Portions of the record may have been created with voice recognition software. Occasional wrong-word or 'sound-a-like' substitutions may have occurred due to the inherent limitations of voice recognition software.  Read the chart carefully and recognize, using context, where substitutions may have occurred.

## 2023-07-01 NOTE — Progress Notes (Signed)
PROGRESS NOTE    Shane Jenkins  PIR:[id number] DOB: Mar 26, 1986 DOA: 06/23/2023 PCP: Pediatrics, [facility]    Assessment & Plan:   Principal Problem:   Acute pancreatitis Active Problems:   Hypertensive urgency   Hyponatremia   Pancreatic insufficiency   Tobacco abuse   Protein-calorie malnutrition, severe   Pseudocyst of pancreas  Assessment and Plan: Acute pancreatitis: w/ pseudocyst & ascites. Not used alcohol w/in last 6 months as per pt. Continue on IV octreotide as per GI. Completed 7 day course of rocephin for SBP. Will start to wean TPN as pt is tolerating po diet. S/p paracentesis 4.2 L of ascitic fluid removed on 06/24/2023. Analysis of ascitic fluid shows possible pancreatic source other than cirrhosis. Will likely need stent placement and will need transfer to [facility]. On 8/20 GI recommended EUS and pancreatic stent and transferred to [facility]. S/p IR paracentesis x2 on 06/30/23, 2.9 L fluid was removed. Patient got accepted at [facility] under IM service and GI will be consulted for EUS and pancreatic stent placement.  On waiting list as no beds available    Hypertensive urgency: urgency resolved but still w/ HTN. Labetalol prn    Hyponatremia: possibly secondary to volume overload. Trending up    Pancreatic insufficiency: restart home dose of creon   Tobacco abuse: received smoking cessation counseling already    Constipation: continue on miralax, dulcolax  Iron deficiency: s/p IV iron x 4 doses. Will need to start po iron    Vitamin B12 deficiency: continue on B12 supplement           DVT prophylaxis: lovenox  Code Status: full  Family Communication:  Disposition Plan: waiting on bed at [facility], already been accepted   Level of care: Telemetry Medical Consultants:  GI   Procedures:  Antimicrobials:    Subjective: Pt c/o fatigue   Objective: Vitals:   06/30/23 0731 06/30/23 1531 06/30/23 2025 07/01/23 0412  BP: 121/80 106/72 114/74 106/73   Pulse: 78 78 75 77  Resp: 16 18 18 18   Temp: 98.3 F (36.8 C) (!) 97.2 F (36.2 C) 98.2 F (36.8 C) 98.3 F (36.8 C)  TempSrc: Oral  Oral Oral  SpO2: 97% 97% 97% 97%  Weight:    70.3 kg  Height:        Intake/Output Summary (Last 24 hours) at 07/01/2023 0750 Last data filed at 07/01/2023 0501 Gross per 24 hour  Intake 4014.83 ml  Output 2100 ml  Net 1914.83 ml   Filed Weights   06/29/23 0321 06/30/23 0411 07/01/23 0412  Weight: 71 kg 73.2 kg 70.3 kg    Examination:  General exam: Appears calm and comfortable  Respiratory system: Clear to auscultation. Respiratory effort normal. Cardiovascular system: S1 & S2+. No rubs, gallops or clicks.  Gastrointestinal system: Abdomen is nondistended, soft and nontender. Hypoactive bowel sounds heard. Central nervous system: Alert and oriented. Moves all extremities  Psychiatry: Judgement and insight appear normal. Mood & affect appropriate.     Data Reviewed: I have personally reviewed following labs and imaging studies  CBC: Recent Labs  Lab 06/25/23 0433 06/26/23 0938 06/27/23 0513 06/30/23 0607  WBC 10.8* 7.6 7.6 7.6  HGB 12.7* 10.6* 10.6* 10.4*  HCT 36.2* 31.2* 30.7* 33.7*  MCV 84.4 87.4 86.5 101.5*  PLT 243 235 270 338   Basic Metabolic Panel: Recent Labs  Lab 06/26/23 0938 06/27/23 0513 06/28/23 0321 06/29/23 0509 07/01/23 0448  NA 131* 133* 135 133* 134*  K 4.1 4.4  4.0 4.0 4.4  CL 103 105 103 101 97*  CO2 22 23 23 28 29   GLUCOSE 118* 125* 141* 145* 124*  BUN 11 13 13 11 13   CREATININE 0.58* 0.66 0.65 0.65 0.61  CALCIUM 7.6* 7.7* 7.9* 7.6* 8.0*  MG 2.0 1.9 1.7 1.9  --   PHOS 2.7 2.9 3.5 4.0  --    GFR: Estimated Creatinine Clearance: 125.7 mL/min (by C-G formula based on SCr of 0.61 mg/dL). Liver Function Tests: Recent Labs  Lab 06/25/23 0433 06/26/23 0938 06/27/23 0513 06/29/23 0509 06/29/23 1546 07/01/23 0448  AST 12* 12* 14* 30  --  38  ALT 6 7 9 17   --  29  ALKPHOS 26* 25* 29* 73  --  64   BILITOT 0.8 0.2* 0.3 0.3  --  0.3  PROT 4.4* 4.3* 4.5* 4.4*  --  4.6*  ALBUMIN 2.1* 2.0* 1.9* 1.6* THIS TEST WAS ORDERED IN ERROR AND HAS BEEN CREDITED. 1.8*   Recent Labs  Lab 06/27/23 0513 06/28/23 0321 06/29/23 0509 06/29/23 1546 06/30/23 0607 07/01/23 0448  LIPASE 452* 359* 222*  --  140* 80*  AMYLASE  --   --   --  THIS TEST WAS ORDERED IN ERROR AND HAS BEEN CREDITED.  --   --    No results for input(s): "AMMONIA" in the last 168 hours. Coagulation Profile: Recent Labs  Lab 06/25/23 0433  INR 1.2   Cardiac Enzymes: No results for input(s): "CKTOTAL", "CKMB", "CKMBINDEX", "TROPONINI" in the last 168 hours. BNP (last 3 results) No results for input(s): "PROBNP" in the last 8760 hours. HbA1C: No results for input(s): "HGBA1C" in the last 72 hours. CBG: Recent Labs  Lab 06/30/23 0613 06/30/23 1123 06/30/23 1750 06/30/23 2315 07/01/23 0537  GLUCAP 122* 161* 127* 106* 132*   Lipid Profile: Recent Labs    06/29/23 0509  TRIG 53   Thyroid Function Tests: No results for input(s): "TSH", "T4TOTAL", "FREET4", "T3FREE", "THYROIDAB" in the last 72 hours. Anemia Panel: No results for input(s): "VITAMINB12", "FOLATE", "FERRITIN", "TIBC", "IRON", "RETICCTPCT" in the last 72 hours. Sepsis Labs: No results for input(s): "PROCALCITON", "LATICACIDVEN" in the last 168 hours.  Recent Results (from the past 240 hour(s))  Blood Culture (routine x 2)     Status: None   Collection Time: 06/23/23  6:10 PM   Specimen: BLOOD  Result Value Ref Range Status   Specimen Description BLOOD BLOOD RIGHT WRIST  Final   Special Requests   Final    BOTTLES DRAWN AEROBIC AND ANAEROBIC Blood Culture results may not be optimal due to an inadequate volume of blood received in culture bottles   Culture   Final    NO GROWTH 5 DAYS Performed at [facility], [address], [place], [place] [address]    Report Status 06/28/2023 FINAL  Final  Resp panel by RT-PCR (RSV, Flu A&B,  Covid) Anterior Nasal Swab     Status: None   Collection Time: 06/23/23  6:56 PM   Specimen: Anterior Nasal Swab  Result Value Ref Range Status   SARS Coronavirus 2 by RT PCR NEGATIVE NEGATIVE Final    Comment: (NOTE) SARS-CoV-2 target nucleic acids are NOT DETECTED.  The SARS-CoV-2 RNA is generally detectable in upper respiratory specimens during the acute phase of infection. The lowest concentration of SARS-CoV-2 viral copies this assay can detect is 138 copies/mL. A negative result does not preclude SARS-Cov-2 infection and should not be used as the sole basis for treatment or  other patient management decisions. A negative result may occur with  improper specimen collection/handling, submission of specimen other than nasopharyngeal swab, presence of viral mutation(s) within the areas targeted by this assay, and inadequate number of viral copies(<138 copies/mL). A negative result must be combined with clinical observations, patient history, and epidemiological information. The expected result is Negative.  Fact Sheet for Patients:  BloggerCourse.com  Fact Sheet for Healthcare Providers:  SeriousBroker.it  This test is no t yet approved or cleared by the [place] FDA and  has been authorized for detection and/or diagnosis of SARS-CoV-2 by FDA under an Emergency Use Authorization (EUA). This EUA will remain  in effect (meaning this test can be used) for the duration of the COVID-19 declaration under Section 564(b)(1) of the Act, 21 U.S.C.section 360bbb-3(b)(1), unless the authorization is terminated  or revoked sooner.       Influenza A by PCR NEGATIVE NEGATIVE Final   Influenza B by PCR NEGATIVE NEGATIVE Final    Comment: (NOTE) The Xpert Xpress SARS-CoV-2/FLU/RSV plus assay is intended as an aid in the diagnosis of influenza from Nasopharyngeal swab specimens and should not be used as a sole basis for treatment. Nasal  washings and aspirates are unacceptable for Xpert Xpress SARS-CoV-2/FLU/RSV testing.  Fact Sheet for Patients: BloggerCourse.com  Fact Sheet for Healthcare Providers: SeriousBroker.it  This test is not yet approved or cleared by the [place] FDA and has been authorized for detection and/or diagnosis of SARS-CoV-2 by FDA under an Emergency Use Authorization (EUA). This EUA will remain in effect (meaning this test can be used) for the duration of the COVID-19 declaration under Section 564(b)(1) of the Act, 21 U.S.C. section 360bbb-3(b)(1), unless the authorization is terminated or revoked.     Resp Syncytial Virus by PCR NEGATIVE NEGATIVE Final    Comment: (NOTE) Fact Sheet for Patients: BloggerCourse.com  Fact Sheet for Healthcare Providers: SeriousBroker.it  This test is not yet approved or cleared by the [place] FDA and has been authorized for detection and/or diagnosis of SARS-CoV-2 by FDA under an Emergency Use Authorization (EUA). This EUA will remain in effect (meaning this test can be used) for the duration of the COVID-19 declaration under Section 564(b)(1) of the Act, 21 U.S.C. section 360bbb-3(b)(1), unless the authorization is terminated or revoked.  Performed at [facility], [address] [place], [place] [address]   Blood Culture (routine x 2)     Status: None   Collection Time: 06/23/23  6:56 PM   Specimen: BLOOD  Result Value Ref Range Status   Specimen Description BLOOD LEFT ANTECUBITAL  Final   Special Requests   Final    BOTTLES DRAWN AEROBIC AND ANAEROBIC Blood Culture adequate volume   Culture   Final    NO GROWTH 5 DAYS Performed at [facility], [address], [place], [place] [address]    Report Status 06/28/2023 FINAL  Final  Fungus Culture With Stain     Status: None (Preliminary result)   Collection Time:  06/24/23  3:30 PM   Specimen: PATH Cytology Peritoneal fluid  Result Value Ref Range Status   Fungus Stain Final report  Final    Comment: (NOTE) Performed At: [facility] [address] [place], [place] [id number] Jolene Schimke MD WG:[id number]    Fungus (Mycology) Culture PENDING  Incomplete   Fungal Source PERITONEAL  Final    Comment: Performed at [facility], [address], [place], [place] [address]  Acid Fast Smear (AFB)  Status: None   Collection Time: 06/24/23  3:30 PM   Specimen: PATH Cytology Peritoneal fluid  Result Value Ref Range Status   AFB Specimen Processing Concentration  Final   Acid Fast Smear Negative  Final    Comment: (NOTE) Performed At: [facility] [address] [place], [place] [id number] Jolene Schimke MD WU:[id number]    Source (AFB) PERITONEAL  Final    Comment: Performed at [facility], [address] [place], [place] [address]  Body fluid culture w Gram Stain     Status: None   Collection Time: 06/24/23  3:30 PM   Specimen: PATH Cytology Peritoneal fluid  Result Value Ref Range Status   Specimen Description   Final    PERITONEAL Performed at [facility], [address], [place], [place] [address]    Special Requests   Final    PERITONEAL Performed at [facility], [address] [place], [place] [address]    Gram Stain   Final    RARE WBC PRESENT,BOTH PMN AND MONONUCLEAR NO ORGANISMS SEEN    Culture   Final    NO GROWTH 3 DAYS Performed at [facility], [address] [place], [place] [address]    Report Status 06/28/2023 FINAL  Final  Anaerobic culture w Gram Stain     Status: None   Collection Time: 06/24/23  3:30 PM   Specimen: Peritoneal Washings  Result Value Ref Range Status   Specimen Description PERITONEAL  Final   Special Requests NONE  Final   Culture   Final    NO ANAEROBES ISOLATED Performed at [facility], [address],  [place], [place] [address]    Report Status 06/29/2023 FINAL  Final  Fungus Culture Result     Status: None   Collection Time: 06/24/23  3:30 PM  Result Value Ref Range Status   Result 1 Comment  Final    Comment: (NOTE) KOH/Calcofluor preparation:  no fungus observed. Performed At: [facility] [address] [place], [place] [id number] Jolene Schimke MD DG:[id number]   Body fluid culture w Gram Stain     Status: None (Preliminary result)   Collection Time: 06/29/23  3:45 PM   Specimen: PATH Gross Only; Tissue  Result Value Ref Range Status   Specimen Description   Final    WOUND Performed at [facility], [address] [place], [place] [address]    Special Requests   Final    RT SIDE ASCITES Performed at [facility], [address] [place], [place] [address]    Gram Stain   Final    FEW WBC PRESENT, PREDOMINANTLY PMN NO ORGANISMS SEEN    Culture   Final    NO GROWTH < 24 HOURS Performed at [facility], [address] [place], [place] [address]    Report Status PENDING  Incomplete         Radiology Studies: US Paracentesis  Result Date: 06/30/2023 INDICATION: [id number] Ascites [id number] EXAM: ULTRASOUND GUIDED  PARACENTESIS MEDICATIONS: None. COMPLICATIONS: None immediate. PROCEDURE: Informed written consent was obtained from the patient after a discussion of the risks, benefits and alternatives to treatment. A timeout was performed prior to the initiation of the procedure. Initial ultrasound scanning demonstrates a large amount of ascites within the right lower abdominal quadrant. The right lower abdomen was prepped and draped in the usual sterile fashion. 1% lidocaine was used for local anesthesia. Following this, a 19 gauge, 7-cm,  Yueh catheter was introduced. An ultrasound image was saved for documentation purposes. The paracentesis was performed. The catheter was removed and a dressing was applied. The patient tolerated the procedure well  without immediate post procedural complication. FINDINGS: A total of approximately 2.9 L of clear, straw-colored peritoneal fluid was removed. IMPRESSION: Successful ultrasound-guided paracentesis yielding 2.9 liters of peritoneal fluid. Electronically Signed   By: Olive Bass M.D.   On: 06/30/2023 08:43        Scheduled Meds:  bisacodyl  10 mg Oral QHS   Chlorhexidine Gluconate Cloth  6 each Topical Daily   cyanocobalamin  1,000 mcg Intramuscular Q1200   Followed by   Melene Muller ON 07/05/2023] vitamin B-12  1,000 mcg Oral Daily   enoxaparin (LOVENOX) injection  40 mg Subcutaneous Q24H   feeding supplement  237 mL Oral TID BM   insulin aspart  0-9 Units Subcutaneous Q6H   octreotide  100 mcg Subcutaneous TID   pantoprazole  40 mg Oral Daily   polyethylene glycol  17 g Oral BID   sodium chloride flush  10-40 mL Intracatheter Q12H   thiamine (VITAMIN B1) injection  100 mg Intravenous Q24H   Continuous Infusions:  albumin human     cefTRIAXone (ROCEPHIN)  IV Stopped (06/30/23 1435)   TPN ADULT (ION) 75 mL/hr at 06/30/23 1937     LOS: 8 days    Time spent: 25 mins     Charise Killian, MD [facility] Pager [phone]  If 7PM-7AM, please contact night-coverage [email or web address] 07/01/2023, 7:50 AM

## 2023-07-01 NOTE — Progress Notes (Signed)
Patient is stable this shift. Lines are clean dry and intact with good blood return. TPN is still running- goal is to wean tomorrow as he is tolerating PO intake well. He is able to ambulate to the bathroom and hallway independently. Sounds like he has some social struggles stating "I need to get out of here to get a job and pay my child support before they take me to court." Patient is waiting for a bed at [facility] however there is not one available. Patient is tolerating PO pain meds best this shift.

## 2023-07-01 NOTE — Consult Note (Signed)
PHARMACY - TOTAL PARENTERAL NUTRITION CONSULT NOTE   Indication:  Pancreatitis ascites with abscess. NPO  Patient Measurements: Height: 6\' 1"  (185.4 cm) Weight: 70.3 kg (154 lb 15.7 oz) IBW/kg (Calculated) : 79.9 TPN AdjBW (KG): 63.5 Body mass index is 20.45 kg/m. 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 Patient has a past medical history of pancreatic pseudocysts (stable, small, chronic) and hyponatremia. Pharmacy was consulted by gastro to start TPN on this patient given their complicated diagnosis - pancreatic ascites.   Glucose / Insulin: BG 154 (Novolog 4 unit in the last 24 hours.  Electrolytes: 8/15: WNL Renal: SCr Stable at baseline (0.7) Hepatic: WNL, Lipase 299 >> 522 > 452 > 222 > 140 > 88 Intake / Output; MIVF: positive 12.5 L GI Imaging: 8/15: moderate volume ascites and mesenteric edema.  GI Surgeries / Procedures: 8/14 Paracentesis with 4.2 L of fluid removed   Central access: 8/15 TPN start date: 8/15  Nutritional Goals: Goal TPN rate is 75 mL/hr (see components below)  RD Assessment: Estimated Needs Total Energy Estimated Needs: 2100-2400kcal/day Total Protein Estimated Needs: 105-120g/day Total Fluid Estimated Needs: 1.9-2.2L/day  Current Nutrition:  Clear liquid diet  Plan: Follow up with GI regarding duration of TPN Continue TPN at 75 mL/hr (goal rate) at 1800  Clinisol, 15% Dextrose, 19% Lipid, 38 g/L Protein, 111.6 g/day Kcal, 2200 kcal/day Electrolytes in TPN: Na 80mEq/L, K 28mEq/L, Ca 33mEq/L, Mg 42mEq/L, and Phos 26mmol/L. Cl:Ac 1:1 Add standard MVI and trace elements to TPN Initiate Sensitive q8h SSI and adjust as needed  Monitor TPN labs daily until clinically indicated to monitor only Monday & Thursday  Littie Deeds, PharmD PGY1 Pharmacy 07/01/2023 11:34 AM

## 2023-07-01 NOTE — Plan of Care (Signed)
  Problem: Skin Integrity: Goal: Risk for impaired skin integrity will decrease Outcome: Progressing   Problem: Nutritional: Goal: Maintenance of adequate nutrition will improve Outcome: Progressing Goal: Progress toward achieving an optimal weight will improve Outcome: Progressing

## 2023-07-02 DIAGNOSIS — K859 Acute pancreatitis without necrosis or infection, unspecified: Secondary | ICD-10-CM | POA: Diagnosis not present

## 2023-07-02 LAB — GLUCOSE, CAPILLARY
Glucose-Capillary: 131 mg/dL — ABNORMAL HIGH (ref 70–99)
Glucose-Capillary: 97 mg/dL (ref 70–99)
Glucose-Capillary: 120 mg/dL — ABNORMAL HIGH (ref 70–99)
Glucose-Capillary: 88 mg/dL (ref 70–99)

## 2023-07-02 LAB — COMPREHENSIVE METABOLIC PANEL
ALT: 26 U/L (ref 0–44)
AST: 28 U/L (ref 15–41)
Albumin: 1.9 g/dL — ABNORMAL LOW (ref 3.5–5.0)
Alkaline Phosphatase: 56 U/L (ref 38–126)
Anion gap: 9 (ref 5–15)
BUN: 12 mg/dL (ref 6–20)
CO2: 28 mmol/L (ref 22–32)
Calcium: 8.2 mg/dL — ABNORMAL LOW (ref 8.9–10.3)
Chloride: 97 mmol/L — ABNORMAL LOW (ref 98–111)
Creatinine, Ser: 0.59 mg/dL — ABNORMAL LOW (ref 0.61–1.24)
GFR, Estimated: >60 mL/min (ref >60)
Glucose, Bld: 127 mg/dL — ABNORMAL HIGH (ref 70–99)
Potassium: 4.3 mmol/L (ref 3.5–5.1)
Sodium: 134 mmol/L — ABNORMAL LOW (ref 135–145)
Total Bilirubin: 0.3 mg/dL (ref 0.3–1.2)
Total Protein: 4.8 g/dL — ABNORMAL LOW (ref 6.5–8.1)

## 2023-07-02 LAB — MAGNESIUM: Magnesium: 2.1 mg/dL (ref 1.7–2.4)

## 2023-07-02 LAB — PHOSPHORUS: Phosphorus: 5 mg/dL — ABNORMAL HIGH (ref 2.5–4.6)

## 2023-07-02 LAB — LIPASE, BLOOD: Lipase: 67 U/L — ABNORMAL HIGH (ref 11–51)

## 2023-07-02 MED ORDER — TRACE MINERALS CU-MN-SE-ZN 300-55-60-3000 MCG/ML IV SOLN
INTRAVENOUS | Status: AC
  Filled 2023-07-02: qty 397.6

## 2023-07-02 NOTE — Consult Note (Signed)
PHARMACY - TOTAL PARENTERAL NUTRITION CONSULT NOTE   Indication:  Pancreatitis ascites with abscess. NPO  Patient Measurements: Height: 6\' 1"  (185.4 cm) Weight: 71.4 kg (157 lb 6.5 oz) IBW/kg (Calculated) : 79.9 TPN AdjBW (KG): 63.5 Body mass index is 20.77 kg/m. 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 Patient has a past medical history of pancreatic pseudocysts (stable, small, chronic) and hyponatremia. Pharmacy was consulted by gastro to start TPN on this patient given their complicated diagnosis - pancreatic ascites.   Glucose / Insulin: BG 154 (Novolog 4 unit in the last 24 hours.  Electrolytes: 8/15: WNL Renal: SCr Stable at baseline (0.7) Hepatic: WNL, Lipase 299 >> 522 > 452 > 222 > 140 > 88 Intake / Output; MIVF: positive 12.5 L GI Imaging: 8/15: moderate volume ascites and mesenteric edema.  GI Surgeries / Procedures: 8/14 Paracentesis with 4.2 L of fluid removed   Central access: 8/15 TPN start date: 8/15  Nutritional Goals: Goal TPN rate is 75 mL/hr (see components below)  RD Assessment: Estimated Needs Total Energy Estimated Needs: 2100-2400kcal/day Total Protein Estimated Needs: 105-120g/day Total Fluid Estimated Needs: 1.9-2.2L/day  Current Nutrition:  Clear liquid diet  Plan: Weaning TPN for this evening. Expect to discontinue TPN tomorrow Reduce TPN to 35 mL/hr Electrolytes in TPN: Na 71mEq/L, K 35mEq/L, Ca 68mEq/L, Mg 7mEq/L, and Phos 84mmol/L. Cl:Ac 1:1 Add standard MVI and trace elements to TPN Initiate Sensitive q8h SSI and adjust as needed  Monitor TPN labs daily until clinically indicated to monitor only Monday & Thursday   Elliot Gurney, PharmD, BCPS Clinical Pharmacist  07/02/2023 8:53 AM

## 2023-07-02 NOTE — Plan of Care (Signed)
  Problem: Pain Managment: Goal: General experience of comfort will improve Outcome: Progressing   Problem: Safety: Goal: Ability to remain free from injury will improve Outcome: Progressing   

## 2023-07-02 NOTE — Progress Notes (Signed)
PROGRESS NOTE    Shane Jenkins  NWG:[id number] DOB: 06/20/1986 DOA: 06/23/2023 PCP: Pediatrics, [facility]    Assessment & Plan:   Principal Problem:   Acute pancreatitis Active Problems:   Hypertensive urgency   Hyponatremia   Pancreatic insufficiency   Tobacco abuse   Protein-calorie malnutrition, severe   Pseudocyst of pancreas  Assessment and Plan: Acute pancreatitis: w/ pseudocyst & ascites. Not used alcohol w/in last 6 months as per pt. Continue on IV octreotide as per GI. Completed 7 day course of rocephin for SBP. Advanced to soft diet today & start weaning TPN. S/p paracentesis 4.2 L of ascitic fluid removed on 06/24/2023. Analysis of ascitic fluid shows possible pancreatic source other than cirrhosis. Will likely need stent placement and will need transfer to [facility]. On 8/20 GI recommended EUS, pancreatic stent and transfer to [facility]. S/p IR paracentesis x2 w/ last one on 06/30/23, 2.9 L fluid was removed. Patient got accepted at [facility] under IM service and GI will be consulted for EUS and pancreatic stent placement.  On waiting list still as no beds are available at [facility]    Hypertensive urgency: urgency resolved. BP is WNL currently    Hyponatremia: possibly secondary to volume overload. Na is stable.    Pancreatic insufficiency: continue on home dose of creon   Tobacco abuse: received smoking cessation counseling already    Constipation: continue on dulcolax, milk of mg   Iron deficiency: s/p IV iron x 4 doses. Will need to start po iron    Vitamin B12 deficiency: continue on B12 supplement. Likely etiology of macrocytic anemia           DVT prophylaxis: lovenox  Code Status: full  Family Communication:  Disposition Plan: waiting on a bed at [facility]  Level of care: Telemetry Medical Consultants:  GI   Procedures:  Antimicrobials:    Subjective: Pt c/o intermittent abd pain   Objective: Vitals:   07/01/23 0412 07/01/23 0756 07/01/23 2023  07/02/23 0346  BP: 106/73 112/76 105/66 109/68  Pulse: 77 72 78 73  Resp: 18 18 18 18   Temp: 98.3 F (36.8 C) 98 F (36.7 C) 98.5 F (36.9 C) 97.8 F (36.6 C)  TempSrc: Oral  Oral Oral  SpO2: 97% 100% 98% 98%  Weight: 70.3 kg   71.4 kg  Height:        Intake/Output Summary (Last 24 hours) at 07/02/2023 0839 Last data filed at 07/02/2023 0650 Gross per 24 hour  Intake 240.81 ml  Output 1150 ml  Net -909.19 ml   Filed Weights   06/30/23 0411 07/01/23 0412 07/02/23 0346  Weight: 73.2 kg 70.3 kg 71.4 kg    Examination:  General exam: Appears comfortable  Respiratory system: clear breath sounds b/l  Cardiovascular system: S1/S2+. No rubs or clicks  Gastrointestinal system: abd is soft, tenderness to palpation & normal bowel sounds  Central nervous system: alert & oriented. Moves all extremities  Psychiatry: judgement and insight appears normal. Flat mood and affect     Data Reviewed: I have personally reviewed following labs and imaging studies  CBC: Recent Labs  Lab 06/26/23 0938 06/27/23 0513 06/30/23 0607  WBC 7.6 7.6 7.6  HGB 10.6* 10.6* 10.4*  HCT 31.2* 30.7* 33.7*  MCV 87.4 86.5 101.5*  PLT 235 270 338   Basic Metabolic Panel: Recent Labs  Lab 06/26/23 0938 06/27/23 0513 06/28/23 0321 06/29/23 0509 07/01/23 0448 07/02/23 0517  NA 131* 133* 135 133* 134* 134*  K  4.1 4.4 4.0 4.0 4.4 4.3  CL 103 105 103 101 97* 97*  CO2 22 23 23 28 29 28   GLUCOSE 118* 125* 141* 145* 124* 127*  BUN 11 13 13 11 13 12   CREATININE 0.58* 0.66 0.65 0.65 0.61 0.59*  CALCIUM 7.6* 7.7* 7.9* 7.6* 8.0* 8.2*  MG 2.0 1.9 1.7 1.9  --  2.1  PHOS 2.7 2.9 3.5 4.0  --  5.0*   GFR: Estimated Creatinine Clearance: 127.7 mL/min (A) (by C-G formula based on SCr of 0.59 mg/dL (L)). Liver Function Tests: Recent Labs  Lab 06/26/23 0938 06/27/23 0513 06/29/23 0509 06/29/23 1546 07/01/23 0448 07/02/23 0517  AST 12* 14* 30  --  38 28  ALT 7 9 17   --  29 26  ALKPHOS 25* 29* 73  --   64 56  BILITOT 0.2* 0.3 0.3  --  0.3 0.3  PROT 4.3* 4.5* 4.4*  --  4.6* 4.8*  ALBUMIN 2.0* 1.9* 1.6* THIS TEST WAS ORDERED IN ERROR AND HAS BEEN CREDITED. 1.8* 1.9*   Recent Labs  Lab 06/28/23 0321 06/29/23 0509 06/29/23 1546 06/30/23 0607 07/01/23 0448 07/02/23 0517  LIPASE 359* 222*  --  140* 80* 67*  AMYLASE  --   --  THIS TEST WAS ORDERED IN ERROR AND HAS BEEN CREDITED.  --   --   --    No results for input(s): "AMMONIA" in the last 168 hours. Coagulation Profile: No results for input(s): "INR", "PROTIME" in the last 168 hours.  Cardiac Enzymes: No results for input(s): "CKTOTAL", "CKMB", "CKMBINDEX", "TROPONINI" in the last 168 hours. BNP (last 3 results) No results for input(s): "PROBNP" in the last 8760 hours. HbA1C: No results for input(s): "HGBA1C" in the last 72 hours. CBG: Recent Labs  Lab 06/30/23 2315 07/01/23 0537 07/01/23 2323 07/02/23 0515 07/02/23 0804  GLUCAP 106* 132* 123* 131* 97   Lipid Profile: No results for input(s): "CHOL", "HDL", "LDLCALC", "TRIG", "CHOLHDL", "LDLDIRECT" in the last 72 hours.  Thyroid Function Tests: No results for input(s): "TSH", "T4TOTAL", "FREET4", "T3FREE", "THYROIDAB" in the last 72 hours. Anemia Panel: No results for input(s): "VITAMINB12", "FOLATE", "FERRITIN", "TIBC", "IRON", "RETICCTPCT" in the last 72 hours. Sepsis Labs: No results for input(s): "PROCALCITON", "LATICACIDVEN" in the last 168 hours.  Recent Results (from the past 240 hour(s))  Blood Culture (routine x 2)     Status: None   Collection Time: 06/23/23  6:10 PM   Specimen: BLOOD  Result Value Ref Range Status   Specimen Description BLOOD BLOOD RIGHT WRIST  Final   Special Requests   Final    BOTTLES DRAWN AEROBIC AND ANAEROBIC Blood Culture results may not be optimal due to an inadequate volume of blood received in culture bottles   Culture   Final    NO GROWTH 5 DAYS Performed at [facility], [address] [place], [place]  [address]    Report Status 06/28/2023 FINAL  Final  Resp panel by RT-PCR (RSV, Flu A&B, Covid) Anterior Nasal Swab     Status: None   Collection Time: 06/23/23  6:56 PM   Specimen: Anterior Nasal Swab  Result Value Ref Range Status   SARS Coronavirus 2 by RT PCR NEGATIVE NEGATIVE Final    Comment: (NOTE) SARS-CoV-2 target nucleic acids are NOT DETECTED.  The SARS-CoV-2 RNA is generally detectable in upper respiratory specimens during the acute phase of infection. The lowest concentration of SARS-CoV-2 viral copies this assay can detect is 138 copies/mL. A negative  result does not preclude SARS-Cov-2 infection and should not be used as the sole basis for treatment or other patient management decisions. A negative result may occur with  improper specimen collection/handling, submission of specimen other than nasopharyngeal swab, presence of viral mutation(s) within the areas targeted by this assay, and inadequate number of viral copies(<138 copies/mL). A negative result must be combined with clinical observations, patient history, and epidemiological information. The expected result is Negative.  Fact Sheet for Patients:  BloggerCourse.com  Fact Sheet for Healthcare Providers:  SeriousBroker.it  This test is no t yet approved or cleared by the [place] FDA and  has been authorized for detection and/or diagnosis of SARS-CoV-2 by FDA under an Emergency Use Authorization (EUA). This EUA will remain  in effect (meaning this test can be used) for the duration of the COVID-19 declaration under Section 564(b)(1) of the Act, 21 U.S.C.section 360bbb-3(b)(1), unless the authorization is terminated  or revoked sooner.       Influenza A by PCR NEGATIVE NEGATIVE Final   Influenza B by PCR NEGATIVE NEGATIVE Final    Comment: (NOTE) The Xpert Xpress SARS-CoV-2/FLU/RSV plus assay is intended as an aid in the diagnosis of influenza from  Nasopharyngeal swab specimens and should not be used as a sole basis for treatment. Nasal washings and aspirates are unacceptable for Xpert Xpress SARS-CoV-2/FLU/RSV testing.  Fact Sheet for Patients: BloggerCourse.com  Fact Sheet for Healthcare Providers: SeriousBroker.it  This test is not yet approved or cleared by the [place] FDA and has been authorized for detection and/or diagnosis of SARS-CoV-2 by FDA under an Emergency Use Authorization (EUA). This EUA will remain in effect (meaning this test can be used) for the duration of the COVID-19 declaration under Section 564(b)(1) of the Act, 21 U.S.C. section 360bbb-3(b)(1), unless the authorization is terminated or revoked.     Resp Syncytial Virus by PCR NEGATIVE NEGATIVE Final    Comment: (NOTE) Fact Sheet for Patients: BloggerCourse.com  Fact Sheet for Healthcare Providers: SeriousBroker.it  This test is not yet approved or cleared by the [place] FDA and has been authorized for detection and/or diagnosis of SARS-CoV-2 by FDA under an Emergency Use Authorization (EUA). This EUA will remain in effect (meaning this test can be used) for the duration of the COVID-19 declaration under Section 564(b)(1) of the Act, 21 U.S.C. section 360bbb-3(b)(1), unless the authorization is terminated or revoked.  Performed at [facility], [address] [place], [place] [address]   Blood Culture (routine x 2)     Status: None   Collection Time: 06/23/23  6:56 PM   Specimen: BLOOD  Result Value Ref Range Status   Specimen Description BLOOD LEFT ANTECUBITAL  Final   Special Requests   Final    BOTTLES DRAWN AEROBIC AND ANAEROBIC Blood Culture adequate volume   Culture   Final    NO GROWTH 5 DAYS Performed at [facility], [address] [place], [place] [address]    Report Status 06/28/2023 FINAL  Final   Fungus Culture With Stain     Status: None (Preliminary result)   Collection Time: 06/24/23  3:30 PM   Specimen: PATH Cytology Peritoneal fluid  Result Value Ref Range Status   Fungus Stain Final report  Final    Comment: (NOTE) Performed At: [facility] [address] [place], [place] [id number] Jolene Schimke MD WG:[id number]    Fungus (Mycology) Culture PENDING  Incomplete   Fungal Source PERITONEAL  Final    Comment:  Performed at [facility], [address] [place], [place] [address]  Acid Fast Smear (AFB)     Status: None   Collection Time: 06/24/23  3:30 PM   Specimen: PATH Cytology Peritoneal fluid  Result Value Ref Range Status   AFB Specimen Processing Concentration  Final   Acid Fast Smear Negative  Final    Comment: (NOTE) Performed At: [facility] [address] [place], [place] [id number] Jolene Schimke MD [id number]:[id number]    Source (AFB) PERITONEAL  Final    Comment: Performed at [facility], [address] [place], [place] [address]  Body fluid culture w Gram Stain     Status: None   Collection Time: 06/24/23  3:30 PM   Specimen: PATH Cytology Peritoneal fluid  Result Value Ref Range Status   Specimen Description   Final    PERITONEAL Performed at [facility], [address], [place], [place] [address]    Special Requests   Final    PERITONEAL Performed at [facility], [address] [place], [place] [address]    Gram Stain   Final    RARE WBC PRESENT,BOTH PMN AND MONONUCLEAR NO ORGANISMS SEEN    Culture   Final    NO GROWTH 3 DAYS Performed at [facility], [address] [place], [place] [address]    Report Status 06/28/2023 FINAL  Final  Anaerobic culture w Gram Stain     Status: None   Collection Time: 06/24/23  3:30 PM   Specimen: Peritoneal Washings  Result Value Ref Range Status   Specimen Description PERITONEAL  Final   Special Requests NONE  Final   Culture   Final     NO ANAEROBES ISOLATED Performed at [facility], [address] [place], [place] [address]    Report Status 06/29/2023 FINAL  Final  Fungus Culture Result     Status: None   Collection Time: 06/24/23  3:30 PM  Result Value Ref Range Status   Result 1 Comment  Final    Comment: (NOTE) KOH/Calcofluor preparation:  no fungus observed. Performed At: [facility] [address] [place], [place] [id number] Jolene Schimke MD TK:[id number]   Body fluid culture w Gram Stain     Status: None (Preliminary result)   Collection Time: 06/29/23  3:45 PM   Specimen: PATH Gross Only; Tissue  Result Value Ref Range Status   Specimen Description   Final    WOUND Performed at [facility], [address], [place], [place] [address]    Special Requests   Final    RT SIDE ASCITES Performed at [facility], [address] [place], [place] [address]    Gram Stain   Final    FEW WBC PRESENT, PREDOMINANTLY PMN NO ORGANISMS SEEN    Culture   Final    NO GROWTH 2 DAYS Performed at [facility], [address], [place], [place] [address]    Report Status PENDING  Incomplete         Radiology Studies: No results found.      Scheduled Meds:  bisacodyl  10 mg Oral QHS   Chlorhexidine Gluconate Cloth  6 each Topical Daily   vitamin B-12  1,000 mcg Oral Daily   enoxaparin (LOVENOX) injection  40 mg Subcutaneous Q24H   feeding supplement  237 mL Oral TID BM   insulin aspart  0-9 Units Subcutaneous Q6H   lipase/protease/amylase  72,000 Units Oral  TID WC   octreotide  100 mcg Subcutaneous TID   pantoprazole  40 mg Oral Daily   polyethylene glycol  17 g Oral BID   sodium chloride flush  10-40 mL Intracatheter Q12H   Continuous Infusions:  albumin human     TPN ADULT (ION) 75 mL/hr at 07/01/23 1733     LOS: 9 days    Time spent: 25 mins     Charise Killian, MD [facility] Pager [phone]  If 7PM-7AM, please contact  night-coverage [email or web address] 07/02/2023, 8:39 AM

## 2023-07-03 DIAGNOSIS — K859 Acute pancreatitis without necrosis or infection, unspecified: Secondary | ICD-10-CM | POA: Diagnosis not present

## 2023-07-03 LAB — GLUCOSE, CAPILLARY
Glucose-Capillary: 116 mg/dL — ABNORMAL HIGH (ref 70–99)
Glucose-Capillary: 134 mg/dL — ABNORMAL HIGH (ref 70–99)
Glucose-Capillary: 87 mg/dL (ref 70–99)
Glucose-Capillary: 96 mg/dL (ref 70–99)
Glucose-Capillary: 99 mg/dL (ref 70–99)

## 2023-07-03 LAB — BASIC METABOLIC PANEL
Anion gap: 9 (ref 5–15)
BUN: 14 mg/dL (ref 6–20)
CO2: 31 mmol/L (ref 22–32)
Calcium: 8.1 mg/dL — ABNORMAL LOW (ref 8.9–10.3)
Chloride: 95 mmol/L — ABNORMAL LOW (ref 98–111)
Creatinine, Ser: 0.81 mg/dL (ref 0.61–1.24)
GFR, Estimated: >60 mL/min (ref >60)
Glucose, Bld: 99 mg/dL (ref 70–99)
Potassium: 4.6 mmol/L (ref 3.5–5.1)
Sodium: 135 mmol/L (ref 135–145)

## 2023-07-03 LAB — BODY FLUID CULTURE W GRAM STAIN: Culture: NO GROWTH

## 2023-07-03 LAB — LIPASE, BLOOD: Lipase: 61 U/L — ABNORMAL HIGH (ref 11–51)

## 2023-07-03 NOTE — Plan of Care (Signed)

## 2023-07-03 NOTE — Consult Note (Signed)
PHARMACY - TOTAL PARENTERAL NUTRITION CONSULT NOTE   Indication:  Pancreatitis ascites with abscess. NPO  Patient Measurements: Height: 6\' 1"  (185.4 cm) Weight: 71.4 kg (157 lb 6.5 oz) IBW/kg (Calculated) : 79.9 TPN AdjBW (KG): 63.5 Body mass index is 20.77 kg/m. 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 Patient has a past medical history of pancreatic pseudocysts (stable, small, chronic) and hyponatremia. Pharmacy was consulted by gastro to start TPN on this patient given their complicated diagnosis - pancreatic ascites.   Glucose / Insulin: BG 154 (Novolog 4 unit in the last 24 hours.  Electrolytes: 8/15: WNL Renal: SCr Stable at baseline (0.7) Hepatic: WNL, Lipase 299 >> 522 > 452 > 222 > 140 > 88 Intake / Output; MIVF: positive 12.5 L GI Imaging: 8/15: moderate volume ascites and mesenteric edema.  GI Surgeries / Procedures: 8/14 Paracentesis with 4.2 L of fluid removed   Central access: 8/15 TPN start date: 8/15  Nutritional Goals: Goal TPN rate is 75 mL/hr (see components below)  RD Assessment: Estimated Needs Total Energy Estimated Needs: 2100-2400kcal/day Total Protein Estimated Needs: 105-120g/day Total Fluid Estimated Needs: 1.9-2.2L/day  Current Nutrition:  Clear liquid diet  Plan: Stop TPN 8/23 @1800 .  Add standard MVI and trace elements to TPN Initiate Sensitive q8h SSI and adjust as needed  Monitor TPN labs daily until clinically indicated to monitor only Monday & Thursday   Elliot Gurney, PharmD, BCPS Clinical Pharmacist  07/03/2023 10:17 AM

## 2023-07-03 NOTE — Progress Notes (Signed)
PROGRESS NOTE    Shane Jenkins  NGE:[id number] DOB: 09/07/1986 DOA: 06/23/2023 PCP: Pediatrics, [facility]    Assessment & Plan:   Principal Problem:   Acute pancreatitis Active Problems:   Hypertensive urgency   Hyponatremia   Pancreatic insufficiency   Tobacco abuse   Protein-calorie malnutrition, severe   Pseudocyst of pancreas  Assessment and Plan: Acute pancreatitis: w/ pseudocyst & ascites. Not used alcohol w/in last 6 months as per pt. Continue on IV octreotide as per GI. Completed 7 day course of rocephin for SBP. Will wean off TPN today and diet advanced to regular diet. S/p paracentesis 4.2 L of ascitic fluid removed on 06/24/2023. Analysis of ascitic fluid shows possible pancreatic source other than cirrhosis. Will likely need stent placement and will need transfer to [facility]. On 8/20 GI recommended EUS, pancreatic stent and transfer to [facility]. S/p IR paracentesis x2 w/ last one on 06/30/23, 2.9 L fluid was removed. Patient got accepted at [facility] under IM service and GI will be consulted for EUS and pancreatic stent placement.  On a waiting list as still no beds at [facility]    Hypertensive urgency: urgency resolved. BP is WNL currently still    Hyponatremia: WNL today    Pancreatic insufficiency: continue on home dose of creon   Tobacco abuse: received smoking cessation counseling already    Constipation: continue on milk of mg, dulcolax  Iron deficiency: s/p IV iron x 4 doses. Will need to start po iron   Vitamin B12 deficiency: continue on B12 supplement. Likely etiology of macrocytic anemia   Moderate protein calorie malnutrition: continue w/ nutritional supplements          DVT prophylaxis: lovenox  Code Status: full  Family Communication:  Disposition Plan: waiting on a bed at [facility]  Level of care: Telemetry Medical Consultants:  GI   Procedures:  Antimicrobials:    Subjective: Pt c/o malaise   Objective: Vitals:   07/02/23 0346 07/02/23  1402 07/02/23 2041 07/03/23 0213  BP: 109/68 102/70 108/69 98/64  Pulse: 73 77 76 73  Resp: 18 18 18 16   Temp: 97.8 F (36.6 C) 97.9 F (36.6 C) 97.8 F (36.6 C) (!) 97.4 F (36.3 C)  TempSrc: Oral Oral    SpO2: 98% 99% 98% 98%  Weight: 71.4 kg     Height:        Intake/Output Summary (Last 24 hours) at 07/03/2023 0824 Last data filed at 07/03/2023 0400 Gross per 24 hour  Intake 358.04 ml  Output 2450 ml  Net -2091.96 ml   Filed Weights   06/30/23 0411 07/01/23 0412 07/02/23 0346  Weight: 73.2 kg 70.3 kg 71.4 kg    Examination:  General exam: appears calm & comfortable   Respiratory system: clear breath sounds b/l  Cardiovascular system: S1 &S2+. No rubs or clicks  Gastrointestinal system: abd is soft, NT, ND & hypoactive bowel sounds  Central nervous system: alert & oriented. Moves all extremities  Psychiatry: judgement and insight appears at baseline. Flat mood and affect     Data Reviewed: I have personally reviewed following labs and imaging studies  CBC: Recent Labs  Lab 06/26/23 0938 06/27/23 0513 06/30/23 0607  WBC 7.6 7.6 7.6  HGB 10.6* 10.6* 10.4*  HCT 31.2* 30.7* 33.7*  MCV 87.4 86.5 101.5*  PLT 235 270 338   Basic Metabolic Panel: Recent Labs  Lab 06/26/23 0938 06/27/23 0513 06/28/23 0321 06/29/23 0509 07/01/23 0448 07/02/23 0517  NA 131* 133* 135  133* 134* 134*  K 4.1 4.4 4.0 4.0 4.4 4.3  CL 103 105 103 101 97* 97*  CO2 22 23 23 28 29 28   GLUCOSE 118* 125* 141* 145* 124* 127*  BUN 11 13 13 11 13 12   CREATININE 0.58* 0.66 0.65 0.65 0.61 0.59*  CALCIUM 7.6* 7.7* 7.9* 7.6* 8.0* 8.2*  MG 2.0 1.9 1.7 1.9  --  2.1  PHOS 2.7 2.9 3.5 4.0  --  5.0*   GFR: Estimated Creatinine Clearance: 127.7 mL/min (A) (by C-G formula based on SCr of 0.59 mg/dL (L)). Liver Function Tests: Recent Labs  Lab 06/26/23 0938 06/27/23 0513 06/29/23 0509 06/29/23 1546 07/01/23 0448 07/02/23 0517  AST 12* 14* 30  --  38 28  ALT 7 9 17   --  29 26  ALKPHOS  25* 29* 73  --  64 56  BILITOT 0.2* 0.3 0.3  --  0.3 0.3  PROT 4.3* 4.5* 4.4*  --  4.6* 4.8*  ALBUMIN 2.0* 1.9* 1.6* THIS TEST WAS ORDERED IN ERROR AND HAS BEEN CREDITED. 1.8* 1.9*   Recent Labs  Lab 06/29/23 0509 06/29/23 1546 06/30/23 0607 07/01/23 0448 07/02/23 0517 07/03/23 0619  LIPASE 222*  --  140* 80* 67* 61*  AMYLASE  --  THIS TEST WAS ORDERED IN ERROR AND HAS BEEN CREDITED.  --   --   --   --    No results for input(s): "AMMONIA" in the last 168 hours. Coagulation Profile: No results for input(s): "INR", "PROTIME" in the last 168 hours.  Cardiac Enzymes: No results for input(s): "CKTOTAL", "CKMB", "CKMBINDEX", "TROPONINI" in the last 168 hours. BNP (last 3 results) No results for input(s): "PROBNP" in the last 8760 hours. HbA1C: No results for input(s): "HGBA1C" in the last 72 hours. CBG: Recent Labs  Lab 07/02/23 0804 07/02/23 1148 07/02/23 1712 07/03/23 0023 07/03/23 0637  GLUCAP 97 120* 88 96 99   Lipid Profile: No results for input(s): "CHOL", "HDL", "LDLCALC", "TRIG", "CHOLHDL", "LDLDIRECT" in the last 72 hours.  Thyroid Function Tests: No results for input(s): "TSH", "T4TOTAL", "FREET4", "T3FREE", "THYROIDAB" in the last 72 hours. Anemia Panel: No results for input(s): "VITAMINB12", "FOLATE", "FERRITIN", "TIBC", "IRON", "RETICCTPCT" in the last 72 hours. Sepsis Labs: No results for input(s): "PROCALCITON", "LATICACIDVEN" in the last 168 hours.  Recent Results (from the past 240 hour(s))  Blood Culture (routine x 2)     Status: None   Collection Time: 06/23/23  6:10 PM   Specimen: BLOOD  Result Value Ref Range Status   Specimen Description BLOOD BLOOD RIGHT WRIST  Final   Special Requests   Final    BOTTLES DRAWN AEROBIC AND ANAEROBIC Blood Culture results may not be optimal due to an inadequate volume of blood received in culture bottles   Culture   Final    NO GROWTH 5 DAYS Performed at [facility], [address], [place],  [place] [address]    Report Status 06/28/2023 FINAL  Final  Resp panel by RT-PCR (RSV, Flu A&B, Covid) Anterior Nasal Swab     Status: None   Collection Time: 06/23/23  6:56 PM   Specimen: Anterior Nasal Swab  Result Value Ref Range Status   SARS Coronavirus 2 by RT PCR NEGATIVE NEGATIVE Final    Comment: (NOTE) SARS-CoV-2 target nucleic acids are NOT DETECTED.  The SARS-CoV-2 RNA is generally detectable in upper respiratory specimens during the acute phase of infection. The lowest concentration of SARS-CoV-2 viral copies this assay can detect  is 138 copies/mL. A negative result does not preclude SARS-Cov-2 infection and should not be used as the sole basis for treatment or other patient management decisions. A negative result may occur with  improper specimen collection/handling, submission of specimen other than nasopharyngeal swab, presence of viral mutation(s) within the areas targeted by this assay, and inadequate number of viral copies(<138 copies/mL). A negative result must be combined with clinical observations, patient history, and epidemiological information. The expected result is Negative.  Fact Sheet for Patients:  BloggerCourse.com  Fact Sheet for Healthcare Providers:  SeriousBroker.it  This test is no t yet approved or cleared by the [place] FDA and  has been authorized for detection and/or diagnosis of SARS-CoV-2 by FDA under an Emergency Use Authorization (EUA). This EUA will remain  in effect (meaning this test can be used) for the duration of the COVID-19 declaration under Section 564(b)(1) of the Act, 21 U.S.C.section 360bbb-3(b)(1), unless the authorization is terminated  or revoked sooner.       Influenza A by PCR NEGATIVE NEGATIVE Final   Influenza B by PCR NEGATIVE NEGATIVE Final    Comment: (NOTE) The Xpert Xpress SARS-CoV-2/FLU/RSV plus assay is intended as an aid in the diagnosis of influenza from  Nasopharyngeal swab specimens and should not be used as a sole basis for treatment. Nasal washings and aspirates are unacceptable for Xpert Xpress SARS-CoV-2/FLU/RSV testing.  Fact Sheet for Patients: BloggerCourse.com  Fact Sheet for Healthcare Providers: SeriousBroker.it  This test is not yet approved or cleared by the [place] FDA and has been authorized for detection and/or diagnosis of SARS-CoV-2 by FDA under an Emergency Use Authorization (EUA). This EUA will remain in effect (meaning this test can be used) for the duration of the COVID-19 declaration under Section 564(b)(1) of the Act, 21 U.S.C. section 360bbb-3(b)(1), unless the authorization is terminated or revoked.     Resp Syncytial Virus by PCR NEGATIVE NEGATIVE Final    Comment: (NOTE) Fact Sheet for Patients: BloggerCourse.com  Fact Sheet for Healthcare Providers: SeriousBroker.it  This test is not yet approved or cleared by the [place] FDA and has been authorized for detection and/or diagnosis of SARS-CoV-2 by FDA under an Emergency Use Authorization (EUA). This EUA will remain in effect (meaning this test can be used) for the duration of the COVID-19 declaration under Section 564(b)(1) of the Act, 21 U.S.C. section 360bbb-3(b)(1), unless the authorization is terminated or revoked.  Performed at [facility], [address] [place], [place] [address]   Blood Culture (routine x 2)     Status: None   Collection Time: 06/23/23  6:56 PM   Specimen: BLOOD  Result Value Ref Range Status   Specimen Description BLOOD LEFT ANTECUBITAL  Final   Special Requests   Final    BOTTLES DRAWN AEROBIC AND ANAEROBIC Blood Culture adequate volume   Culture   Final    NO GROWTH 5 DAYS Performed at [facility], [address], [place], [place] [address]    Report Status 06/28/2023 FINAL  Final   Fungus Culture With Stain     Status: None (Preliminary result)   Collection Time: 06/24/23  3:30 PM   Specimen: PATH Cytology Peritoneal fluid  Result Value Ref Range Status   Fungus Stain Final report  Final    Comment: (NOTE) Performed At: [facility] [address] [place], [place] [id number] Jolene Schimke MD QV:[id number]    Fungus (Mycology) Culture PENDING  Incomplete   Fungal Source PERITONEAL  Final    Comment: Performed at [facility], [address] [place], [place] [address]  Acid Fast Smear (AFB)     Status: None   Collection Time: 06/24/23  3:30 PM   Specimen: PATH Cytology Peritoneal fluid  Result Value Ref Range Status   AFB Specimen Processing Concentration  Final   Acid Fast Smear Negative  Final    Comment: (NOTE) Performed At: [facility] [address] [place], [place] [id number] Jolene Schimke MD XB:[id number]    Source (AFB) PERITONEAL  Final    Comment: Performed at [facility], [address] [place], [place] [address]  Body fluid culture w Gram Stain     Status: None   Collection Time: 06/24/23  3:30 PM   Specimen: PATH Cytology Peritoneal fluid  Result Value Ref Range Status   Specimen Description   Final    PERITONEAL Performed at [facility], [address], [place], [place] [address]    Special Requests   Final    PERITONEAL Performed at [facility], [address] [place], [place] [address]    Gram Stain   Final    RARE WBC PRESENT,BOTH PMN AND MONONUCLEAR NO ORGANISMS SEEN    Culture   Final    NO GROWTH 3 DAYS Performed at [facility], [address], [place], [place] [address]    Report Status 06/28/2023 FINAL  Final  Anaerobic culture w Gram Stain     Status: None   Collection Time: 06/24/23  3:30 PM   Specimen: Peritoneal Washings  Result Value Ref Range Status   Specimen Description PERITONEAL  Final   Special Requests NONE  Final   Culture   Final     NO ANAEROBES ISOLATED Performed at [facility], [address], [place], [place] [address]    Report Status 06/29/2023 FINAL  Final  Fungus Culture Result     Status: None   Collection Time: 06/24/23  3:30 PM  Result Value Ref Range Status   Result 1 Comment  Final    Comment: (NOTE) KOH/Calcofluor preparation:  no fungus observed. Performed At: [facility] [address] [place], [place] [id number] Jolene Schimke MD IH:[id number]   Body fluid culture w Gram Stain     Status: None (Preliminary result)   Collection Time: 06/29/23  3:45 PM   Specimen: PATH Gross Only; Tissue  Result Value Ref Range Status   Specimen Description   Final    WOUND Performed at [facility], [address], [place], [place] [address]    Special Requests   Final    RT SIDE ASCITES Performed at [facility], [address] [place], [place] [address]    Gram Stain   Final    FEW WBC PRESENT, PREDOMINANTLY PMN NO ORGANISMS SEEN    Culture   Final    NO GROWTH 3 DAYS Performed at [facility], [address] [place], [place] [address]    Report Status PENDING  Incomplete         Radiology Studies: No results found.      Scheduled Meds:  bisacodyl  10 mg Oral QHS   Chlorhexidine Gluconate Cloth  6 each Topical Daily   vitamin B-12  1,000 mcg Oral Daily   enoxaparin (LOVENOX) injection  40 mg Subcutaneous Q24H   feeding supplement  237 mL Oral TID BM   insulin aspart  0-9 Units Subcutaneous Q6H  lipase/protease/amylase  72,000 Units Oral TID WC   octreotide  100 mcg Subcutaneous TID   pantoprazole  40 mg Oral Daily   polyethylene glycol  17 g Oral BID   sodium chloride flush  10-40 mL Intracatheter Q12H   Continuous Infusions:  albumin human     TPN ADULT (ION) 35 mL/hr at 07/02/23 1726     LOS: 10 days    Time spent: 25 mins     Charise Killian, MD [facility] Pager [phone]  If 7PM-7AM, please contact  night-coverage [email or web address] 07/03/2023, 8:24 AM

## 2023-07-04 DIAGNOSIS — K859 Acute pancreatitis without necrosis or infection, unspecified: Secondary | ICD-10-CM | POA: Diagnosis not present

## 2023-07-04 LAB — LIPASE, FLUID: Lipase-Fluid: 16905 U/L

## 2023-07-04 LAB — LIPASE, BLOOD: Lipase: 61 U/L — ABNORMAL HIGH (ref 11–51)

## 2023-07-04 LAB — GLUCOSE, CAPILLARY
Glucose-Capillary: 87 mg/dL (ref 70–99)
Glucose-Capillary: 96 mg/dL (ref 70–99)

## 2023-07-04 NOTE — Plan of Care (Signed)

## 2023-07-04 NOTE — Progress Notes (Signed)
GI Inpatient Follow-up Note  Subjective:  Patient seen walking the halls. Has been weaned off TPN. Eating solid food without pain. He is requesting IV morphine 3-4 times daily.  Scheduled Inpatient Medications:   bisacodyl  10 mg Oral QHS   Chlorhexidine Gluconate Cloth  6 each Topical Daily   vitamin B-12  1,000 mcg Oral Daily   enoxaparin (LOVENOX) injection  40 mg Subcutaneous Q24H   feeding supplement  237 mL Oral TID BM   lipase/protease/amylase  72,000 Units Oral TID WC   octreotide  100 mcg Subcutaneous TID   pantoprazole  40 mg Oral Daily   polyethylene glycol  17 g Oral BID   sodium chloride flush  10-40 mL Intracatheter Q12H    Continuous Inpatient Infusions:    albumin human      PRN Inpatient Medications:  acetaminophen **OR** acetaminophen, albumin human, HYDROcodone-acetaminophen, labetalol, magnesium hydroxide, morphine injection, ondansetron **OR** ondansetron (ZOFRAN) IV, mouth rinse, sodium chloride flush, traZODone  Review of Systems: Review of Systems  Constitutional:  Negative for chills and fever.  Respiratory:  Negative for cough.   Cardiovascular:  Negative for chest pain.  Gastrointestinal:  Positive for abdominal pain. Negative for blood in stool, constipation, diarrhea, melena, nausea and vomiting.  Musculoskeletal:  Negative for joint pain.  Skin:  Negative for rash.  Neurological:  Negative for focal weakness.  Psychiatric/Behavioral:  Negative for substance abuse.   All other systems reviewed and are negative.     Physical Examination: BP 103/73 (BP Location: Left Arm)   Pulse 66   Temp 98.7 F (37.1 C) (Oral)   Resp 18   Ht 6\' 1"  (1.854 m)   Wt 72 kg   SpO2 95%   BMI 20.94 kg/m  Gen: NAD, alert and oriented x 4 HEENT: PEERLA, EOMI, Neck: supple, no JVD or thyromegaly Chest: No respiratory distress Abd: soft, slightly distended but no fluid wave Ext: no edema, well perfused with 2+ pulses, Skin: no rash or lesions noted Lymph:  no LAD  Data: Lab Results  Component Value Date   WBC 7.6 06/30/2023   HGB 10.4 (L) 06/30/2023   HCT 33.7 (L) 06/30/2023   MCV 101.5 (H) 06/30/2023   PLT 338 06/30/2023   Recent Labs  Lab 06/30/23 0607  HGB 10.4*   Lab Results  Component Value Date   NA 135 07/03/2023   K 4.6 07/03/2023   CL 95 (L) 07/03/2023   CO2 31 07/03/2023   BUN 14 07/03/2023   CREATININE 0.81 07/03/2023   Lab Results  Component Value Date   ALT 26 07/02/2023   AST 28 07/02/2023   ALKPHOS 56 07/02/2023   BILITOT 0.3 07/02/2023   No results for input(s): "APTT", "INR", "PTT" in the last 168 hours. Assessment/Plan: Shane Jenkins is a 37 y.o. gentleman with history of alcohol abuse but sober for 7 months who presented with abdominal pain and found to have pancreatitis with pancreatic ascites. Treated with TPN and octreotide. Required paracentesis in hospital. Concern that he may need a tertiary center due to pancreatic ascites but now patient is tolerating solid diet and is off TPN. His abdomen is not severely distended and he is walking the halls. He is still requiring IV pain medicines occasionally.  Recommendations:  - likely can f/u as an outpatient with a tertiary center given he is tolerating PO intake with minimal abdominal distension - he does need to be weaned off IV pain medicines - please call back if any questions or  concerns  Merlyn Lot MD, MPH [facility] GI

## 2023-07-04 NOTE — Progress Notes (Signed)
PROGRESS NOTE    Motty Spano  VZD:[id number] DOB: 1986-03-03 DOA: 06/23/2023 PCP: Pediatrics, [facility]    Assessment & Plan:   Principal Problem:   Acute pancreatitis Active Problems:   Hypertensive urgency   Hyponatremia   Pancreatic insufficiency   Tobacco abuse   Protein-calorie malnutrition, severe   Pseudocyst of pancreas  Assessment and Plan: Acute pancreatitis: w/ pseudocyst & ascites. Not used alcohol w/in last 6 months as per pt. Continue on IV octreotide as per GI. Completed 7 day course of rocephin for SBP. TPN has been d/c and pt is tolerating a regular diet. S/p paracentesis 4.2 L of ascitic fluid removed on 06/24/2023. Analysis of ascitic fluid shows possible pancreatic source other than cirrhosis. Will likely need stent placement and will need transfer to [facility]. On 8/20 GI recommended EUS, pancreatic stent and transfer to [facility]. S/p IR paracentesis x2 w/ last one on 06/30/23, 2.9 L fluid was removed. Got accepted at [facility] under IM service and GI will be consulted for EUS and pancreatic stent placement.  On a waiting list as still no beds at [facility] but transfer to [facility] is no longer needed at this time & pt can f/u outpatient w/ [facility] as per GI. Will start using po pain meds instead of IV morphine in effort to prepare to be d/c home    Hypertensive urgency: urgency resolved. BP is WNL currently    Hyponatremia: resolved   Pancreatic insufficiency: continue on home dose of creon   Tobacco abuse: received smoking cessation counseling already    Constipation: continue w/ dulcolax, milk of mg   Iron deficiency: s/p IV iron x 4 doses. Will start po iron at d/c   Vitamin B12 deficiency: continue on B12 supplement. Likely etiology of macrocytic anemia   Moderate protein calorie malnutrition: continue w/ nutritional supplements           DVT prophylaxis: lovenox  Code Status: full  Family Communication:  Disposition Plan: can likely d/c home when pt's pain  is controlled w/ po pain meds only   Level of care: Telemetry Medical Status is: Inpatient Remains inpatient appropriate because: can likely d/c home when pt's pain is controlled w/ po pain meds only     Consultants:  GI   Procedures:  Antimicrobials:    Subjective: Pt c/o fatigue. Pt denies any nausea or vomiting  Objective: Vitals:   07/03/23 0834 07/03/23 1559 07/03/23 1942 07/04/23 0436  BP: 108/72 100/64 95/60 104/68  Pulse: 65 80 80 75  Resp: 17 20 20 20   Temp: 97.9 F (36.6 C) 97.8 F (36.6 C) 98.4 F (36.9 C) 98.3 F (36.8 C)  TempSrc: Oral Oral Oral Oral  SpO2: 95% 96% 98% 97%  Weight:    72 kg  Height:        Intake/Output Summary (Last 24 hours) at 07/04/2023 0818 Last data filed at 07/03/2023 1100 Gross per 24 hour  Intake 480 ml  Output --  Net 480 ml   Filed Weights   07/02/23 0346 07/03/23 0740 07/04/23 0436  Weight: 71.4 kg 71.4 kg 72 kg    Examination:  General exam: appears comfortable  Respiratory system: clear breath sounds b/l  Cardiovascular system: S1&S2+. No rubs or clicks  Gastrointestinal system: abd is tenderness to palpation, ND & hypoactive bowel sounds  Central nervous system: alert & oriented. Moves all extremities  Psychiatry: judgement and insight appears normal. Flat mood and affect    Data Reviewed: I have personally  reviewed following labs and imaging studies  CBC: Recent Labs  Lab 06/30/23 0607  WBC 7.6  HGB 10.4*  HCT 33.7*  MCV 101.5*  PLT 338   Basic Metabolic Panel: Recent Labs  Lab 06/28/23 0321 06/29/23 0509 07/01/23 0448 07/02/23 0517 07/03/23 0618  NA 135 133* 134* 134* 135  K 4.0 4.0 4.4 4.3 4.6  CL 103 101 97* 97* 95*  CO2 23 28 29 28 31   GLUCOSE 141* 145* 124* 127* 99  BUN 13 11 13 12 14   CREATININE 0.65 0.65 0.61 0.59* 0.81  CALCIUM 7.9* 7.6* 8.0* 8.2* 8.1*  MG 1.7 1.9  --  2.1  --   PHOS 3.5 4.0  --  5.0*  --    GFR: Estimated Creatinine Clearance: 127.2 mL/min (by C-G formula  based on SCr of 0.81 mg/dL). Liver Function Tests: Recent Labs  Lab 06/29/23 0509 06/29/23 1546 07/01/23 0448 07/02/23 0517  AST 30  --  38 28  ALT 17  --  29 26  ALKPHOS 73  --  64 56  BILITOT 0.3  --  0.3 0.3  PROT 4.4*  --  4.6* 4.8*  ALBUMIN 1.6* THIS TEST WAS ORDERED IN ERROR AND HAS BEEN CREDITED. 1.8* 1.9*   Recent Labs  Lab 06/29/23 1546 06/30/23 0607 07/01/23 0448 07/02/23 0517 07/03/23 0619 07/04/23 0456  LIPASE  --  140* 80* 67* 61* 61*  AMYLASE THIS TEST WAS ORDERED IN ERROR AND HAS BEEN CREDITED.  --   --   --   --   --    No results for input(s): "AMMONIA" in the last 168 hours. Coagulation Profile: No results for input(s): "INR", "PROTIME" in the last 168 hours.  Cardiac Enzymes: No results for input(s): "CKTOTAL", "CKMB", "CKMBINDEX", "TROPONINI" in the last 168 hours. BNP (last 3 results) No results for input(s): "PROBNP" in the last 8760 hours. HbA1C: No results for input(s): "HGBA1C" in the last 72 hours. CBG: Recent Labs  Lab 07/03/23 1126 07/03/23 1603 07/03/23 2036 07/04/23 0015 07/04/23 0514  GLUCAP 134* 116* 87 87 96   Lipid Profile: No results for input(s): "CHOL", "HDL", "LDLCALC", "TRIG", "CHOLHDL", "LDLDIRECT" in the last 72 hours.  Thyroid Function Tests: No results for input(s): "TSH", "T4TOTAL", "FREET4", "T3FREE", "THYROIDAB" in the last 72 hours. Anemia Panel: No results for input(s): "VITAMINB12", "FOLATE", "FERRITIN", "TIBC", "IRON", "RETICCTPCT" in the last 72 hours. Sepsis Labs: No results for input(s): "PROCALCITON", "LATICACIDVEN" in the last 168 hours.  Recent Results (from the past 240 hour(s))  Fungus Culture With Stain     Status: None (Preliminary result)   Collection Time: 06/24/23  3:30 PM   Specimen: PATH Cytology Peritoneal fluid  Result Value Ref Range Status   Fungus Stain Final report  Final    Comment: (NOTE) Performed At: [facility] [address] [place], [place] [id number] Jolene Schimke  MD [id number]:[id number]    Fungus (Mycology) Culture PENDING  Incomplete   Fungal Source PERITONEAL  Final    Comment: Performed at [facility], [address] [place], [place] [address]  Acid Fast Smear (AFB)     Status: None   Collection Time: 06/24/23  3:30 PM   Specimen: PATH Cytology Peritoneal fluid  Result Value Ref Range Status   AFB Specimen Processing Concentration  Final   Acid Fast Smear Negative  Final    Comment: (NOTE) Performed At: [facility] [address] [place], [place] [id number] Jolene Schimke MD [id number]    Source (AFB)  PERITONEAL  Final    Comment: Performed at [facility], [address] [place], [place] [address]  Body fluid culture w Gram Stain     Status: None   Collection Time: 06/24/23  3:30 PM   Specimen: PATH Cytology Peritoneal fluid  Result Value Ref Range Status   Specimen Description   Final    PERITONEAL Performed at [facility], [address] [place], [place] [address]    Special Requests   Final    PERITONEAL Performed at [facility], [address] [place], [place] [address]    Gram Stain   Final    RARE WBC PRESENT,BOTH PMN AND MONONUCLEAR NO ORGANISMS SEEN    Culture   Final    NO GROWTH 3 DAYS Performed at [facility], [address], [place], [place] [address]    Report Status 06/28/2023 FINAL  Final  Anaerobic culture w Gram Stain     Status: None   Collection Time: 06/24/23  3:30 PM   Specimen: Peritoneal Washings  Result Value Ref Range Status   Specimen Description PERITONEAL  Final   Special Requests NONE  Final   Culture   Final    NO ANAEROBES ISOLATED Performed at [facility], [address], [place], [place] [address]    Report Status 06/29/2023 FINAL  Final  Fungus Culture Result     Status: None   Collection Time: 06/24/23  3:30 PM  Result Value Ref Range Status   Result 1 Comment  Final    Comment: (NOTE) KOH/Calcofluor preparation:  no  fungus observed. Performed At: [facility] [address] [place], [place] [id number] Jolene Schimke MD VQ:[id number]   Body fluid culture w Gram Stain     Status: None   Collection Time: 06/29/23  3:45 PM   Specimen: PATH Gross Only; Tissue  Result Value Ref Range Status   Specimen Description   Final    WOUND Performed at [facility], [address], [place], [place] [address]    Special Requests   Final    RT SIDE ASCITES Performed at [facility], [address] [place], [place] [address]    Gram Stain   Final    FEW WBC PRESENT, PREDOMINANTLY PMN NO ORGANISMS SEEN    Culture   Final    NO GROWTH 3 DAYS Performed at [facility], [address] [place], [place] [address]    Report Status 07/03/2023 FINAL  Final         Radiology Studies: No results found.      Scheduled Meds:  bisacodyl  10 mg Oral QHS   Chlorhexidine Gluconate Cloth  6 each Topical Daily   vitamin B-12  1,000 mcg Oral Daily   enoxaparin (LOVENOX) injection  40 mg Subcutaneous Q24H   feeding supplement  237 mL Oral TID BM   lipase/protease/amylase  72,000 Units Oral TID WC   octreotide  100 mcg Subcutaneous TID   pantoprazole  40 mg Oral Daily   polyethylene glycol  17 g Oral BID   sodium chloride flush  10-40 mL Intracatheter Q12H   Continuous Infusions:  albumin human       LOS: 11 days    Time spent: 25 mins     Charise Killian, MD [facility] Pager [phone]  If 7PM-7AM, please contact night-coverage [email or web address] 07/04/2023, 8:18 AM

## 2023-07-05 DIAGNOSIS — K859 Acute pancreatitis without necrosis or infection, unspecified: Secondary | ICD-10-CM | POA: Diagnosis not present

## 2023-07-05 LAB — CBC
HCT: 27.1 % — ABNORMAL LOW (ref 39.0–52.0)
Hemoglobin: 8.9 g/dL — ABNORMAL LOW (ref 13.0–17.0)
MCH: 31.1 pg (ref 26.0–34.0)
MCHC: 32.8 g/dL (ref 30.0–36.0)
MCV: 94.8 fL (ref 80.0–100.0)
Platelets: 274 10*3/uL (ref 150–400)
RBC: 2.86 MIL/uL — ABNORMAL LOW (ref 4.22–5.81)
RDW: 12.2 % (ref 11.5–15.5)
WBC: 5.3 10*3/uL (ref 4.0–10.5)
nRBC: 0 % (ref 0.0–0.2)

## 2023-07-05 LAB — COMPREHENSIVE METABOLIC PANEL
ALT: 19 U/L (ref 0–44)
AST: 21 U/L (ref 15–41)
Albumin: 2.5 g/dL — ABNORMAL LOW (ref 3.5–5.0)
Alkaline Phosphatase: 92 U/L (ref 38–126)
Anion gap: 8 (ref 5–15)
BUN: 29 mg/dL — ABNORMAL HIGH (ref 6–20)
CO2: 17 mmol/L — ABNORMAL LOW (ref 22–32)
Calcium: 8 mg/dL — ABNORMAL LOW (ref 8.9–10.3)
Chloride: 109 mmol/L (ref 98–111)
Creatinine, Ser: 1.21 mg/dL (ref 0.61–1.24)
GFR, Estimated: >60 mL/min (ref >60)
Glucose, Bld: 143 mg/dL — ABNORMAL HIGH (ref 70–99)
Potassium: 4.4 mmol/L (ref 3.5–5.1)
Sodium: 134 mmol/L — ABNORMAL LOW (ref 135–145)
Total Bilirubin: <0.1 mg/dL — ABNORMAL LOW (ref 0.3–1.2)
Total Protein: 6.3 g/dL — ABNORMAL LOW (ref 6.5–8.1)

## 2023-07-05 MED ORDER — HYDROCODONE-ACETAMINOPHEN 5-325 MG PO TABS: 1.0000 | ORAL_TABLET | Freq: Four times a day (QID) | ORAL | 0 refills | Status: AC | PRN

## 2023-07-05 MED ORDER — CYANOCOBALAMIN 1000 MCG PO TABS: 1000.0000 ug | ORAL_TABLET | Freq: Every day | ORAL | 0 refills | Status: DC

## 2023-07-05 MED ORDER — PANTOPRAZOLE SODIUM 40 MG PO TBEC: 40.0000 mg | DELAYED_RELEASE_TABLET | Freq: Every day | ORAL | 0 refills | Status: DC

## 2023-07-05 MED ORDER — PANCRELIPASE (LIP-PROT-AMYL) 36000-114000 UNITS PO CPEP: 72000.0000 [IU] | ORAL_CAPSULE | Freq: Three times a day (TID) | ORAL | 0 refills | Status: DC

## 2023-07-05 NOTE — Discharge Summary (Signed)
Physician Discharge Summary  Shane Wallen Sr. ZOX:[id number] DOB: 05-15-1986 DOA: 06/23/2023  PCP: Pediatrics, [facility]  Admit date: 06/23/2023 Discharge date: 07/05/2023  Admitted From: home Disposition:  home  Recommendations for Outpatient Follow-up:  Follow up with PCP in 1-2 weeks F/u w/ GI at [facility] as soon as you can   Home Health: no  Equipment/Devices:  Discharge Condition: stable  CODE STATUS: full  Diet recommendation: regular  Brief/Interim Summary: HPI was taken from Dr. Arville Care: Shane Sergeant Sr. is a 37 y.o. Caucasian male with medical history significant for essential hypertension, alcohol abuse, currently sober for 6 months and alcoholic pancreatitis with pancreatic insufficiency, who presented to the emergency room with acute onset of worsening abdominal pain over the last week mainly in the epigastric area in the left upper quadrant, which significantly deteriorated today with associated nausea without vomiting.  He has been having constipation but is passing flatus.  No fever or chills.  No dysuria, oliguria or hematuria or flank pain.   ED Course: When he came to the ER, BP was 147/100 with respiratory to 24 with otherwise normal vital signs.  Labs revealed hyponatremia 124 and above chloremia of 88 with CO2 of 19 and glucose 124, calcium 8.6 and anion gap of 17.  Albumin was 2.8 and total bili 1.5.  Serum lipase came back elevated at 386.  Lactic acid was 2 and CBC showed leukocytosis of 14.5.  Coagulation profile was normal.  Respiratory panel including COVID-19 PCR came back negative.  Blood cultures were drawn.      EKG as reviewed by me : EKG showed sinus tachycardia with rate 150 with right atrial enlargement and minimal voltage criteria for LVH with peaked T waves and true lateral leads and T wave inversion inferiorly.    Imaging: Abdominal and pelvic CT scan with contrast revealed the following: Possible mild acute pancreatitis, equivocal. Correlate  with laboratory evaluation.   Associated small pseudocysts measuring up to 17 mm in the uncinate process, mildly progressive.   Moderate abdominopelvic ascites, progressive. No drainable walled-off fluid collection.     The patient was given 4 mg of IV morphine sulfate and 4 mg of IV Zofran, 50 mcg of IV fentanyl and 2 g of IV Rocephin as well as 2 L bolus of IV normal saline.  He will be admitted to a medical telemetry bed for further evaluation and management.  As per Dr. Mayford Knife 8/21-8/25/24: Pt was able to be weaned off of TPN and was tolerating regular po diet prior to d/c. GI re-evaluated pt on 07/04/23 and stated pt did not need to be transferred to [facility] inpatient and could f/u outpatient at [facility] Pt verbalized his understanding   Discharge Diagnoses:  Principal Problem:   Acute pancreatitis Active Problems:   Hypertensive urgency   Hyponatremia   Pancreatic insufficiency   Tobacco abuse   Protein-calorie malnutrition, severe   Pseudocyst of pancreas  Acute pancreatitis: w/ pseudocyst & ascites. Not used alcohol w/in last 6 months as per pt. Continue on IV octreotide as per GI. Completed 7 day course of rocephin for SBP. TPN has been d/c and pt is tolerating a regular diet. S/p paracentesis 4.2 L of ascitic fluid removed on 06/24/2023. Analysis of ascitic fluid shows possible pancreatic source other than cirrhosis. Will likely need stent placement and will need transfer to [facility]. On 8/20 GI recommended EUS, pancreatic stent and transfer to [facility]. S/p IR paracentesis x2 w/ last one on 06/30/23, 2.9 L fluid  was removed. Got accepted at [facility] under IM service and GI will be consulted for EUS and pancreatic stent placement.  On a waiting list as still no beds at [facility] but transfer to [facility] is no longer needed at this time & pt can f/u outpatient w/ [facility] as per GI. Norco prn for pain.    Hypertensive urgency: urgency resolved. BP is WNL currently    Hyponatremia: resolved   Pancreatic  insufficiency: continue on home dose of creon    Tobacco abuse: received smoking cessation counseling already    Constipation: continue w/ dulcolax, milk of mg    Iron deficiency: s/p IV iron x 4 doses. Will start po iron at d/c    Vitamin B12 deficiency: continue on B12 supplement. Likely etiology of macrocytic anemia    Moderate protein calorie malnutrition: continue w/ nutritional supplements   Discharge Instructions  Discharge Instructions     Diet general   Complete by: As directed    Discharge instructions   Complete by: As directed    F/u w/ PCP in 1-2 weeks. F/u w/ GI at [facility] as soon as you can   Increase activity slowly   Complete by: As directed       Allergies as of 07/05/2023       Reactions   Oxycodone Rash   Rash/itching which required benadryl to resolve   Sudafed [pseudoephedrine]    History per pt of this being linked with his seizures        Medication List     STOP taking these medications    acetaminophen 500 MG tablet Commonly known as: TYLENOL   losartan 25 MG tablet Commonly known as: COZAAR       TAKE these medications    cyanocobalamin 1000 MCG tablet Take 1 tablet (1,000 mcg total) by mouth daily. Start taking on: July 06, 2023   HYDROcodone-acetaminophen 5-325 MG tablet Commonly known as: NORCO/VICODIN Take 1 tablet by mouth every 6 (six) hours as needed for up to 5 days for moderate pain or severe pain.   lipase/protease/amylase 16109 UNITS Cpep capsule Commonly known as: CREON Take 2 capsules (72,000 Units total) by mouth 3 (three) times daily with meals.   pantoprazole 40 MG tablet Commonly known as: PROTONIX Take 1 tablet (40 mg total) by mouth daily. Start taking on: July 06, 2023        Allergies  Allergen Reactions   Oxycodone Rash    Rash/itching which required benadryl to resolve   Sudafed [Pseudoephedrine]     History per pt of this being linked with his seizures    Consultations: GI     Procedures/Studies: US Paracentesis  Result Date: 06/30/2023 INDICATION: [id number] Ascites [id number] EXAM: ULTRASOUND GUIDED  PARACENTESIS MEDICATIONS: None. COMPLICATIONS: None immediate. PROCEDURE: Informed written consent was obtained from the patient after a discussion of the risks, benefits and alternatives to treatment. A timeout was performed prior to the initiation of the procedure. Initial ultrasound scanning demonstrates a large amount of ascites within the right lower abdominal quadrant. The right lower abdomen was prepped and draped in the usual sterile fashion. 1% lidocaine was used for local anesthesia. Following this, a 19 gauge, 7-cm, Yueh catheter was introduced. An ultrasound image was saved for documentation purposes. The paracentesis was performed. The catheter was removed and a dressing was applied. The patient tolerated the procedure well without immediate post procedural complication. FINDINGS: A total of approximately 2.9 L of clear, straw-colored peritoneal fluid was removed. IMPRESSION: Successful ultrasound-guided  paracentesis yielding 2.9 liters of peritoneal fluid. Electronically Signed   By: Olive Bass M.D.   On: 06/30/2023 08:43   [place] EKG SITE RITE  Result Date: 06/25/2023 If Site Rite image not attached, placement could not be confirmed due to current cardiac rhythm.  MR LIVER W WO CONTRAST  Result Date: 06/25/2023 CLINICAL DATA:  Cirrhosis.  Pancreatic cyst. EXAM: MRI ABDOMEN WITHOUT AND WITH CONTRAST TECHNIQUE: Multiplanar multisequence MR imaging of the abdomen was performed both before and after the administration of intravenous contrast. CONTRAST:  6mL GADAVIST GADOBUTROL 1 MMOL/ML IV SOLN COMPARISON:  06/23/2023 CT.  02/23/2023 MRI FINDINGS: Lower chest: Normal heart size without pericardial or pleural effusion. Hepatobiliary: The caudate is mildly prominent but there is no evidence of cirrhosis. No gallstones or acute cholecystitis. The common duct is upper normal  to minimally dilated for age at 8 mm on 18/3. No obstructive stone or mass. Pancreas: Sequelae of chronic pancreatitis, including mild duct dilatation, irregularity, and side branch duct ectasia. Acute pancreatitis is difficult to evaluate/exclude, given the diffuse ascites and mesenteric edema. Pancreatic uncinate process nonenhancing complex cystic lesion of 1.3 cm on 23/4 is again favored to represent a pseudocyst. This does demonstrate complexity in its dependent portion, including on 25/12 and 37/15. Smaller cystic foci within the pancreatic tail and body including at up to 6 mm on 38/21 are also likely pseudocysts. Spleen:  Normal in size, without focal abnormality. Adrenals/Urinary Tract: Normal adrenal glands. Normal kidneys, without hydronephrosis. Stomach/Bowel: Proximal gastric underdistention. Thickening of jejunal loops including on 67/21 is mild to moderate. No bowel obstruction. Normal colon. Vascular/Lymphatic: Aortic atherosclerosis. Patent portal vein. The splenic vein is patent but diminutive. gastroepiploic collaterals including on 23/21, suggesting chronic splenic vein insufficiency. Prominent abdominal retroperitoneal and peripancreatic nodes are likely reactive. There is mild adenopathy within the jejunal mesentery, similar back to 05/04/2020 and likely reactive. Example 8 mm on 64/26. Other:  Moderate volume ascites. Musculoskeletal: No acute osseous abnormality. IMPRESSION: 1. Mild caudate lobe enlargement without specific evidence of cirrhosis. 2. Findings of chronic pancreatitis, with intraparenchymal pseudocysts as detailed above. 3. Moderate volume ascites and mesenteric edema. This makes evaluation for acute pancreatitis challenging. Suspected based on recent elevated lipase. 4. Jejunal wall thickening may represent enteritis or be secondary to decreased albumin. Electronically Signed   By: Jeronimo Greaves M.D.   On: 06/25/2023 09:22   US Paracentesis  Result Date:  06/24/2023 INDICATION: Patient with a history of cirrhosis presents today with ascites. Interventional radiology asked to perform a diagnostic and therapeutic paracentesis. EXAM: ULTRASOUND GUIDED PARACENTESIS MEDICATIONS: 1% lidocaine 10 mL COMPLICATIONS: None immediate. PROCEDURE: Informed written consent was obtained from the patient after a discussion of the risks, benefits and alternatives to treatment. A timeout was performed prior to the initiation of the procedure. Initial ultrasound scanning demonstrates a large amount of ascites within the right lower abdominal quadrant. The right lower abdomen was prepped and draped in the usual sterile fashion. 1% lidocaine was used for local anesthesia. Following this, a 19 gauge, 7-cm, Yueh catheter was introduced. An ultrasound image was saved for documentation purposes. The paracentesis was performed. The catheter was removed and a dressing was applied. The patient tolerated the procedure well without immediate post procedural complication. Patient received post-procedure intravenous albumin; see nursing notes for details. FINDINGS: A total of approximately 4.2 L of clear yellow fluid was removed. Samples were sent to the laboratory as requested by the clinical team. IMPRESSION: Successful ultrasound-guided paracentesis yielding 4.2 liters of peritoneal  fluid. Procedure performed by Alwyn Ren NP PLAN: If the patient eventually requires >/=2 paracenteses in a 30 day period, candidacy for formal evaluation by the [facility] Interventional Radiology [facility] will be assessed. Electronically Signed   By: Malachy Moan M.D.   On: 06/24/2023 16:29   CT ABDOMEN PELVIS W CONTRAST  Result Date: 06/23/2023 CLINICAL DATA:  Abdominal pain, nausea/vomiting EXAM: CT ABDOMEN AND PELVIS WITH CONTRAST TECHNIQUE: Multidetector CT imaging of the abdomen and pelvis was performed using the standard protocol following bolus administration of intravenous  contrast. RADIATION DOSE REDUCTION: This exam was performed according to the departmental dose-optimization program which includes automated exposure control, adjustment of the mA and/or kV according to patient size and/or use of iterative reconstruction technique. CONTRAST:  OMNIPAQUE IOHEXOL 300 MG/ML  SOLN COMPARISON:  MRI abdomen dated 02/23/2023. CT abdomen/pelvis dated 12/23/2022. FINDINGS: Lower chest: Lung bases are clear. Hepatobiliary: Liver is within normal limits. Gallbladder is unremarkable. No intrahepatic or extrahepatic duct dilatation. Pancreas: 17 mm pseudocyst in the pancreatic uncinate process, improved from prior CT but mildly progressive from MR. Additional 8 mm pseudocyst along the pancreatic tail (series 2/image 23), mildly progressive. Very mild peripancreatic fluid/inflammatory changes along the pancreatic head/uncinate process. No drainable walled-off fluid collection. Spleen: Within normal limits. Adrenals/Urinary Tract: Adrenal glands are within normal limits. Kidneys are within normal limits.  No hydronephrosis. Bladder is within normal limits. Stomach/Bowel: Stomach is within normal limits. Mildly prominent but nondilated loops of small bowel in the central abdomen. No evidence of bowel obstruction. Normal appendix (series 2/image 86). No colonic wall thickening or inflammatory changes. Vascular/Lymphatic: No evidence of abdominal aortic aneurysm. Chronic splenic vein occlusion. No suspicious abdominopelvic lymphadenopathy. Reproductive: Prostate is unremarkable. Other: Moderate abdominopelvic ascites, progressive. Musculoskeletal: Visualized osseous structures are within normal limits. IMPRESSION: Possible mild acute pancreatitis, equivocal. Correlate with laboratory evaluation. Associated small pseudocysts measuring up to 17 mm in the uncinate process, mildly progressive. Moderate abdominopelvic ascites, progressive. No drainable walled-off fluid collection. Electronically  Signed   By: Charline Bills M.D.   On: 06/23/2023 19:52   (Echo, Carotid, EGD, Colonoscopy, ERCP)    Subjective: Pt denies any complaints    Discharge Exam: Vitals:   07/04/23 2013 07/05/23 0348  BP: (!) 97/58 109/75  Pulse: 75 63  Resp: 16 20  Temp: 98.4 F (36.9 C) 97.9 F (36.6 C)  SpO2: 97% 99%   Vitals:   07/04/23 0933 07/04/23 1622 07/04/23 2013 07/05/23 0348  BP: 103/73 109/71 (!) 97/58 109/75  Pulse: 66 66 75 63  Resp: 18 16 16 20   Temp: 98.7 F (37.1 C) 97.7 F (36.5 C) 98.4 F (36.9 C) 97.9 F (36.6 C)  TempSrc: Oral Oral Oral Oral  SpO2: 95% 98% 97% 99%  Weight:    74 kg  Height:        General: Pt is alert, awake, not in acute distress Cardiovascular: S1/S2 +, no rubs, no gallops Respiratory: CTA bilaterally, no wheezing, no rhonchi Abdominal: Soft, NT,  bowel sounds + Extremities: no edema, no cyanosis    The results of significant diagnostics from this hospitalization (including imaging, microbiology, ancillary and laboratory) are listed below for reference.     Microbiology: Recent Results (from the past 240 hour(s))  Body fluid culture w Gram Stain     Status: None   Collection Time: 06/29/23  3:45 PM   Specimen: PATH Gross Only; Tissue  Result Value Ref Range Status   Specimen Description   Final  WOUND Performed at [facility], [address] [place], [place] [address]    Special Requests   Final    RT SIDE ASCITES Performed at [facility], [address] [place], [place] [address]    Gram Stain   Final    FEW WBC PRESENT, PREDOMINANTLY PMN NO ORGANISMS SEEN    Culture   Final    NO GROWTH 3 DAYS Performed at [facility], [address] [place], [place] [address]    Report Status 07/03/2023 FINAL  Final     Labs: BNP (last 3 results) No results for input(s): "BNP" in the last 8760 hours. Basic Metabolic Panel: Recent Labs  Lab 06/29/23 0509 07/01/23 0448 07/02/23 0517 07/03/23 0618  07/05/23 0437  NA 133* 134* 134* 135 134*  K 4.0 4.4 4.3 4.6 4.4  CL 101 97* 97* 95* 109  CO2 28 29 28 31  17*  GLUCOSE 145* 124* 127* 99 143*  BUN 11 13 12 14  29*  CREATININE 0.65 0.61 0.59* 0.81 1.21  CALCIUM 7.6* 8.0* 8.2* 8.1* 8.0*  MG 1.9  --  2.1  --   --   PHOS 4.0  --  5.0*  --   --    Liver Function Tests: Recent Labs  Lab 06/29/23 0509 06/29/23 1546 07/01/23 0448 07/02/23 0517 07/05/23 0437  AST 30  --  38 28 21  ALT 17  --  29 26 19   ALKPHOS 73  --  64 56 92  BILITOT 0.3  --  0.3 0.3 <0.1*  PROT 4.4*  --  4.6* 4.8* 6.3*  ALBUMIN 1.6* THIS TEST WAS ORDERED IN ERROR AND HAS BEEN CREDITED. 1.8* 1.9* 2.5*   Recent Labs  Lab 06/29/23 1546 06/30/23 0607 07/01/23 0448 07/02/23 0517 07/03/23 0619 07/04/23 0456  LIPASE  --  140* 80* 67* 61* 61*  AMYLASE THIS TEST WAS ORDERED IN ERROR AND HAS BEEN CREDITED.  --   --   --   --   --    No results for input(s): "AMMONIA" in the last 168 hours. CBC: Recent Labs  Lab 06/30/23 0607 07/05/23 0437  WBC 7.6 5.3  HGB 10.4* 8.9*  HCT 33.7* 27.1*  MCV 101.5* 94.8  PLT 338 274   Cardiac Enzymes: No results for input(s): "CKTOTAL", "CKMB", "CKMBINDEX", "TROPONINI" in the last 168 hours. BNP: Invalid input(s): "POCBNP" CBG: Recent Labs  Lab 07/03/23 1126 07/03/23 1603 07/03/23 2036 07/04/23 0015 07/04/23 0514  GLUCAP 134* 116* 87 87 96   D-Dimer No results for input(s): "DDIMER" in the last 72 hours. Hgb A1c No results for input(s): "HGBA1C" in the last 72 hours. Lipid Profile No results for input(s): "CHOL", "HDL", "LDLCALC", "TRIG", "CHOLHDL", "LDLDIRECT" in the last 72 hours. Thyroid function studies No results for input(s): "TSH", "T4TOTAL", "T3FREE", "THYROIDAB" in the last 72 hours.  Invalid input(s): "FREET3" Anemia work up No results for input(s): "VITAMINB12", "FOLATE", "FERRITIN", "TIBC", "IRON", "RETICCTPCT" in the last 72 hours. Urinalysis    Component Value Date/Time   COLORURINE YELLOW  (A) 06/23/2023 2200   APPEARANCEUR CLEAR (A) 06/23/2023 2200   LABSPEC >1.046 (H) 06/23/2023 2200   PHURINE 5.0 06/23/2023 2200   GLUCOSEU NEGATIVE 06/23/2023 2200   HGBUR NEGATIVE 06/23/2023 2200   BILIRUBINUR NEGATIVE 06/23/2023 2200   KETONESUR 20 (A) 06/23/2023 2200   PROTEINUR 30 (A) 06/23/2023 2200   NITRITE NEGATIVE 06/23/2023 2200   LEUKOCYTESUR NEGATIVE 06/23/2023 2200   Sepsis Labs Recent Labs  Lab 06/30/23  1610 07/05/23 0437  WBC 7.6 5.3   Microbiology Recent Results (from the past 240 hour(s))  Body fluid culture w Gram Stain     Status: None   Collection Time: 06/29/23  3:45 PM   Specimen: PATH Gross Only; Tissue  Result Value Ref Range Status   Specimen Description   Final    WOUND Performed at [facility], [address], [place], [place] [address]    Special Requests   Final    RT SIDE ASCITES Performed at [facility] [address] [place], [place] [address]    Gram Stain   Final    FEW WBC PRESENT, PREDOMINANTLY PMN NO ORGANISMS SEEN    Culture   Final    NO GROWTH 3 DAYS Performed at [facility], [address], [place], [place] [address]    Report Status 07/03/2023 FINAL  Final     Time coordinating discharge: Over 30 minutes  SIGNED:   Charise Killian, MD  [facility] 07/05/2023, 11:29 AM Pager   If 7PM-7AM, please contact night-coverage [email or web address]

## 2023-07-05 NOTE — Progress Notes (Signed)
PICC line removed, tip intact. Discharge instructions reviewed. Patient being wheeled to the medical mall exit for discharge in stable condition.  Shane Jenkins

## 2023-07-05 NOTE — TOC Transition Note (Signed)
Transition of Care [facility]) - CM/SW Discharge Note   Patient Details  Name: Shane Guarnieri Sr. MRN: [id number] Date of Birth: 1986-08-24  Transition of Care [facility]) CM/SW Contact:  Kemper Durie, RN Phone Number: 07/05/2023, 11:37 AM   Clinical Narrative:     Patient has discharge orders to go home today, report he has family/friend that will provide transportation. Denies any TOC needs, state he will call [facility] tomorrow to schedule follow up.   Final next level of care: Home/Self Care Barriers to Discharge: Barriers Resolved   Patient Goals and CMS Choice      Discharge Placement                         Discharge Plan and Services Additional resources added to the After Visit Summary for                                       Social Determinants of Health (SDOH) Interventions SDOH Screenings   Food Insecurity: No Food Insecurity (06/23/2023)  Housing: Low Risk  (06/23/2023)  Transportation Needs: No Transportation Needs (06/23/2023)  Utilities: Not At Risk (06/23/2023)  Tobacco Use: High Risk (06/27/2023)     Readmission Risk Interventions     No data to display

## 2023-07-07 NOTE — ED Notes (Signed)
Patient/fam called and say they cannot get in [facility] because they were never sent the referral fax.  They prefer to go to [place] office if possible.  I called the [place] [facility] office and faxed them the chart at [phone].  Called fam member to inform.  She mentioned that he had pain, but then said he overdid it yesterday.  I told her that he may not be seen right away by GI,and that if he got worse he can always return to the ED in the mean time.

## 2023-07-10 ENCOUNTER — Emergency Department: Payer: Medicaid Other

## 2023-07-10 ENCOUNTER — Emergency Department
Admission: EM | Admit: 2023-07-10 | Discharge: 2023-07-10 | Disposition: A | Discharge status: Home / Self Care | Payer: Medicaid Other | Attending: Student in an Organized Health Care Education/Training Program | Admitting: Student in an Organized Health Care Education/Training Program

## 2023-07-10 ENCOUNTER — Other Ambulatory Visit: Payer: Self-pay

## 2023-07-10 DIAGNOSIS — R188 Other ascites: Secondary | ICD-10-CM | POA: Diagnosis not present

## 2023-07-10 LAB — CBC
HCT: 39.4 % (ref 39.0–52.0)
Hemoglobin: 12.7 g/dL — ABNORMAL LOW (ref 13.0–17.0)
MCH: 28.8 pg (ref 26.0–34.0)
MCHC: 32.2 g/dL (ref 30.0–36.0)
MCV: 89.3 fL (ref 80.0–100.0)
Platelets: 326 10*3/uL (ref 150–400)
RBC: 4.41 MIL/uL (ref 4.22–5.81)
RDW: 14.3 % (ref 11.5–15.5)
WBC: 7.4 10*3/uL (ref 4.0–10.5)
nRBC: 0 % (ref 0.0–0.2)

## 2023-07-10 LAB — COMPREHENSIVE METABOLIC PANEL
ALT: 15 U/L (ref 0–44)
AST: 15 U/L (ref 15–41)
Albumin: 2.7 g/dL — ABNORMAL LOW (ref 3.5–5.0)
Alkaline Phosphatase: 60 U/L (ref 38–126)
Anion gap: 8 (ref 5–15)
BUN: 5 mg/dL — ABNORMAL LOW (ref 6–20)
CO2: 25 mmol/L (ref 22–32)
Calcium: 8.5 mg/dL — ABNORMAL LOW (ref 8.9–10.3)
Chloride: 103 mmol/L (ref 98–111)
Creatinine, Ser: 0.95 mg/dL (ref 0.61–1.24)
GFR, Estimated: >60 mL/min (ref >60)
Glucose, Bld: 98 mg/dL (ref 70–99)
Potassium: 4.1 mmol/L (ref 3.5–5.1)
Sodium: 136 mmol/L (ref 135–145)
Total Bilirubin: 0.3 mg/dL (ref 0.3–1.2)
Total Protein: 6 g/dL — ABNORMAL LOW (ref 6.5–8.1)

## 2023-07-10 LAB — BODY FLUID CELL COUNT WITH DIFFERENTIAL
Eos, Fluid: 0 %
Lymphs, Fluid: 64 %
Monocyte-Macrophage-Serous Fluid: 7 %
Neutrophil Count, Fluid: 28 %
Other Cells, Fluid: 1 %
Total Nucleated Cell Count, Fluid: 3083 cu mm

## 2023-07-10 LAB — PATHOLOGIST SMEAR REVIEW

## 2023-07-10 LAB — LIPASE, BLOOD: Lipase: 43 U/L (ref 11–51)

## 2023-07-10 MED ORDER — HYDROCODONE-ACETAMINOPHEN 5-325 MG PO TABS
1.0000 | ORAL_TABLET | Freq: Once | ORAL | Status: AC
  Administered 2023-07-10: 1 via ORAL
  Filled 2023-07-10: qty 1

## 2023-07-10 MED ORDER — LIDOCAINE HCL (PF) 1 % IJ SOLN
10.0000 mL | Freq: Once | INTRAMUSCULAR | Status: AC
  Administered 2023-07-10: 10 mL via INTRADERMAL

## 2023-07-10 MED ORDER — MORPHINE SULFATE (PF) 4 MG/ML IV SOLN: 4.0000 mg | INTRAVENOUS | Status: DC | PRN

## 2023-07-10 MED ORDER — ALBUMIN HUMAN 25 % IV SOLN
12.5000 g | Freq: Once | INTRAVENOUS | Status: AC
  Administered 2023-07-10: 12.5 g via INTRAVENOUS
  Filled 2023-07-10: qty 50

## 2023-07-10 MED ORDER — SODIUM CHLORIDE 0.9 % IV SOLN: 1.0000 g | Freq: Once | INTRAVENOUS | Status: DC

## 2023-07-10 NOTE — Procedures (Signed)
PROCEDURE SUMMARY:  Successful image-guided paracentesis from the right lower abdomen.  Yielded 4 liters of hazy white fluid.  No immediate complications.  EBL = trace. Patient tolerated well.   Specimen was sent for labs.  Please see imaging section of Epic for full dictation.   Kennieth Francois PA-C 07/10/2023 10:28 AM

## 2023-07-10 NOTE — ED Provider Notes (Signed)
[facility] Provider Note    Event Date/Time   First MD Initiated Contact with Patient 07/10/23 [phone]     (approximate)   History   Abdominal Pain   HPI  Shane Maslo Sr. is a 37 y.o. male history of recurrent ascites with pancreatitis and pseudocyst.  Presents to the ER for evaluation of abdominal discomfort distention.  Does have outpatient follow-up established with [facility] but appointment not for a few weeks.  Was recently admitted for similar symptoms had multiple paracentesis but was deemed appropriate for outpatient follow-up.  He denies any fevers.  Has been tolerating p.o.     Physical Exam   Triage Vital Signs: ED Triage Vitals  Encounter Vitals Group     BP 07/10/23 0727 (!) 131/92     Systolic BP Percentile --      Diastolic BP Percentile --      Pulse Rate 07/10/23 0727 (!) 109     Resp 07/10/23 0727 19     Temp 07/10/23 0727 97.6 F (36.4 C)     Temp Source 07/10/23 0727 Oral     SpO2 07/10/23 0727 100 %     Weight 07/10/23 0728 145 lb (65.8 kg)     Height 07/10/23 0728 6\' 1"  (1.854 m)     Head Circumference --      Peak Flow --      Pain Score 07/10/23 0727 8     Pain Loc --      Pain Education --      Exclude from Growth Chart --     Most recent vital signs: Vitals:   07/10/23 1100 07/10/23 1100  BP: 115/81 115/81  Pulse: 69 69  Resp: 17 17  Temp:    SpO2: 98%      Constitutional: Alert  Eyes: Conjunctivae are normal.  Head: Atraumatic. Nose: No congestion/rhinnorhea. Mouth/Throat: Mucous membranes are moist.   Neck: Painless ROM.  Cardiovascular:   Good peripheral circulation. Respiratory: Normal respiratory effort.  No retractions.  Gastrointestinal: Distended with positive fluid wave some generalized tenderness to palpation no guarding or rebound. Musculoskeletal:  no deformity Neurologic:  MAE spontaneously. No gross focal neurologic deficits are appreciated.  Skin:  Skin is warm, dry and intact. No rash  noted. Psychiatric: Mood and affect are normal. Speech and behavior are normal.    ED Results / Procedures / Treatments   Labs (all labs ordered are listed, but only abnormal results are displayed) Labs Reviewed  COMPREHENSIVE METABOLIC PANEL - Abnormal; Notable for the following components:      Result Value   BUN 5 (*)    Calcium 8.5 (*)    Total Protein 6.0 (*)    Albumin 2.7 (*)    All other components within normal limits  CBC - Abnormal; Notable for the following components:   Hemoglobin 12.7 (*)    All other components within normal limits  BODY FLUID CELL COUNT WITH DIFFERENTIAL - Abnormal; Notable for the following components:   Color, Fluid WHITE (*)    Appearance, Fluid TURBID (*)    All other components within normal limits  LIPASE, BLOOD  URINALYSIS, ROUTINE W REFLEX MICROSCOPIC  PATHOLOGIST SMEAR REVIEW     EKG     RADIOLOGY   PROCEDURES:  Critical Care performed:   Procedures   MEDICATIONS ORDERED IN ED: Medications  morphine (PF) 4 MG/ML injection 4 mg (has no administration in time range)  HYDROcodone-acetaminophen (NORCO/VICODIN) 5-325 MG per tablet 1  tablet (1 tablet Oral Given 07/10/23 0918)  lidocaine (PF) (XYLOCAINE) 1 % injection 10 mL (10 mLs Intradermal Given 07/10/23 1021)  albumin human 25 % solution 12.5 g (12.5 g Intravenous New Bag/Given 07/10/23 1123)     IMPRESSION / MDM / ASSESSMENT AND PLAN / ED COURSE  I reviewed the triage vital signs and the nursing notes.                              Differential diagnosis includes, but is not limited to, SBP, recurrent ascites, pancreatitis, colitis,  Patient presenting to the ER for evaluation of symptoms as described above.  Based on symptoms, risk factors and considered above differential, this presenting complaint could reflect a potentially life-threatening illness therefore the patient will be placed on continuous pulse oximetry and telemetry for monitoring.  Laboratory evaluation  will be sent to evaluate for the above complaints.  ,on   Clinical Course as of 07/10/23 1241  Fri Jul 10, 2023  1106 Patient had paracentesis, pleated with 4 L removed. [PR]  1126 Ultrasound imaging of the abdomen on my review and interpretation to show evidence of ascites. [PR]  1228 Patient feels significantly improved after paracentesis.  Hemodynamically stable.  Repeat abdominal exam soft and benign.  No sign of fever.  Will speak with GI regarding the patient's neutrophilic ascites but on review of past medical record his cultures have been negative with similar differentials so have a lower suspicion for SBP. 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  Patient does have follow-up with [facility].  Does appear appropriate for outpatient follow-up. [PR]    Clinical Course User Index [PR] Willy Eddy, MD     FINAL CLINICAL IMPRESSION(S) / ED DIAGNOSES   Final diagnoses:  Other ascites     Rx / DC Orders   ED Discharge Orders     None        Note:  This document was prepared using Dragon voice recognition software and may include unintentional dictation errors.    Willy Eddy, MD 07/10/23 [phone]

## 2023-07-10 NOTE — ED Triage Notes (Signed)
Pt presents to ED with c/o of ABD pain, pt states "my pancreas is leaking and I need to be drained". Pt states "I was drained 2 weeks ago". NAD noted.

## 2023-07-10 NOTE — Discharge Instructions (Signed)
Please be sure to follow-up with [facility] for further evaluation of your pancreatic pseudocyst and recurrent ascites.

## 2023-07-17 ENCOUNTER — Other Ambulatory Visit: Payer: Self-pay

## 2023-07-17 ENCOUNTER — Emergency Department: Payer: Medicaid Other

## 2023-07-17 ENCOUNTER — Inpatient Hospital Stay
Admission: EM | Admit: 2023-07-17 | Discharge: 2023-07-28 | DRG: 871 | Disposition: A | Discharge status: Home / Self Care | Payer: Medicaid Other | Attending: Internal Medicine | Admitting: Internal Medicine

## 2023-07-17 DIAGNOSIS — R338 Other retention of urine: Secondary | ICD-10-CM | POA: Insufficient documentation

## 2023-07-17 DIAGNOSIS — D62 Acute posthemorrhagic anemia: Secondary | ICD-10-CM | POA: Diagnosis not present

## 2023-07-17 DIAGNOSIS — F1020 Alcohol dependence, uncomplicated: Secondary | ICD-10-CM | POA: Diagnosis present

## 2023-07-17 DIAGNOSIS — K567 Ileus, unspecified: Secondary | ICD-10-CM | POA: Diagnosis present

## 2023-07-17 DIAGNOSIS — K862 Cyst of pancreas: Secondary | ICD-10-CM | POA: Diagnosis present

## 2023-07-17 DIAGNOSIS — Z811 Family history of alcohol abuse and dependence: Secondary | ICD-10-CM

## 2023-07-17 DIAGNOSIS — E872 Acidosis, unspecified: Secondary | ICD-10-CM | POA: Insufficient documentation

## 2023-07-17 DIAGNOSIS — K921 Melena: Secondary | ICD-10-CM | POA: Diagnosis not present

## 2023-07-17 DIAGNOSIS — K652 Spontaneous bacterial peritonitis: Secondary | ICD-10-CM

## 2023-07-17 DIAGNOSIS — E1165 Type 2 diabetes mellitus with hyperglycemia: Secondary | ICD-10-CM | POA: Diagnosis present

## 2023-07-17 DIAGNOSIS — I10 Essential (primary) hypertension: Secondary | ICD-10-CM | POA: Diagnosis present

## 2023-07-17 DIAGNOSIS — Z6821 Body mass index (BMI) 21.0-21.9, adult: Secondary | ICD-10-CM

## 2023-07-17 DIAGNOSIS — R339 Retention of urine, unspecified: Secondary | ICD-10-CM | POA: Diagnosis present

## 2023-07-17 DIAGNOSIS — G8929 Other chronic pain: Secondary | ICD-10-CM | POA: Diagnosis present

## 2023-07-17 DIAGNOSIS — F329 Major depressive disorder, single episode, unspecified: Secondary | ICD-10-CM | POA: Diagnosis present

## 2023-07-17 DIAGNOSIS — F17200 Nicotine dependence, unspecified, uncomplicated: Secondary | ICD-10-CM | POA: Diagnosis present

## 2023-07-17 DIAGNOSIS — K8521 Alcohol induced acute pancreatitis with uninfected necrosis: Secondary | ICD-10-CM | POA: Diagnosis not present

## 2023-07-17 DIAGNOSIS — R000 Tachycardia, unspecified: Secondary | ICD-10-CM | POA: Diagnosis not present

## 2023-07-17 DIAGNOSIS — F1021 Alcohol dependence, in remission: Secondary | ICD-10-CM | POA: Diagnosis present

## 2023-07-17 DIAGNOSIS — A419 Sepsis, unspecified organism: Principal | ICD-10-CM

## 2023-07-17 DIAGNOSIS — K7031 Alcoholic cirrhosis of liver with ascites: Secondary | ICD-10-CM | POA: Diagnosis present

## 2023-07-17 DIAGNOSIS — R748 Abnormal levels of other serum enzymes: Secondary | ICD-10-CM | POA: Diagnosis present

## 2023-07-17 DIAGNOSIS — I82891 Chronic embolism and thrombosis of other specified veins: Secondary | ICD-10-CM | POA: Diagnosis present

## 2023-07-17 DIAGNOSIS — K852 Alcohol induced acute pancreatitis without necrosis or infection: Secondary | ICD-10-CM | POA: Diagnosis present

## 2023-07-17 DIAGNOSIS — K661 Hemoperitoneum: Secondary | ICD-10-CM

## 2023-07-17 DIAGNOSIS — K860 Alcohol-induced chronic pancreatitis: Secondary | ICD-10-CM | POA: Diagnosis present

## 2023-07-17 DIAGNOSIS — K863 Pseudocyst of pancreas: Secondary | ICD-10-CM | POA: Diagnosis present

## 2023-07-17 DIAGNOSIS — E871 Hypo-osmolality and hyponatremia: Secondary | ICD-10-CM | POA: Diagnosis present

## 2023-07-17 DIAGNOSIS — K859 Acute pancreatitis without necrosis or infection, unspecified: Secondary | ICD-10-CM | POA: Diagnosis present

## 2023-07-17 DIAGNOSIS — D696 Thrombocytopenia, unspecified: Secondary | ICD-10-CM | POA: Diagnosis present

## 2023-07-17 DIAGNOSIS — E43 Unspecified severe protein-calorie malnutrition: Secondary | ICD-10-CM | POA: Diagnosis present

## 2023-07-17 DIAGNOSIS — I160 Hypertensive urgency: Secondary | ICD-10-CM | POA: Diagnosis present

## 2023-07-17 DIAGNOSIS — E440 Moderate protein-calorie malnutrition: Secondary | ICD-10-CM | POA: Diagnosis present

## 2023-07-17 DIAGNOSIS — N179 Acute kidney failure, unspecified: Secondary | ICD-10-CM | POA: Diagnosis present

## 2023-07-17 DIAGNOSIS — Z885 Allergy status to narcotic agent status: Secondary | ICD-10-CM

## 2023-07-17 DIAGNOSIS — Z79899 Other long term (current) drug therapy: Secondary | ICD-10-CM

## 2023-07-17 DIAGNOSIS — D75838 Other thrombocytosis: Secondary | ICD-10-CM | POA: Insufficient documentation

## 2023-07-17 DIAGNOSIS — K922 Gastrointestinal hemorrhage, unspecified: Secondary | ICD-10-CM | POA: Diagnosis not present

## 2023-07-17 DIAGNOSIS — Z720 Tobacco use: Secondary | ICD-10-CM | POA: Diagnosis present

## 2023-07-17 DIAGNOSIS — Z87898 Personal history of other specified conditions: Secondary | ICD-10-CM

## 2023-07-17 DIAGNOSIS — R6520 Severe sepsis without septic shock: Secondary | ICD-10-CM | POA: Diagnosis present

## 2023-07-17 DIAGNOSIS — R188 Other ascites: Secondary | ICD-10-CM | POA: Diagnosis not present

## 2023-07-17 DIAGNOSIS — E8809 Other disorders of plasma-protein metabolism, not elsewhere classified: Secondary | ICD-10-CM | POA: Diagnosis present

## 2023-07-17 DIAGNOSIS — Z888 Allergy status to other drugs, medicaments and biological substances status: Secondary | ICD-10-CM

## 2023-07-17 DIAGNOSIS — R140 Abdominal distension (gaseous): Secondary | ICD-10-CM | POA: Diagnosis not present

## 2023-07-17 LAB — BLOOD GAS, VENOUS
Acid-base deficit: 10.4 mmol/L — ABNORMAL HIGH (ref 0.0–2.0)
Bicarbonate: 18.5 mmol/L — ABNORMAL LOW (ref 20.0–28.0)
O2 Saturation: 54.8 %
Patient temperature: 37
pCO2, Ven: 52 mmHg (ref 44–60)
pH, Ven: 7.16 — CL (ref 7.25–7.43)
pO2, Ven: 36 mmHg (ref 32–45)

## 2023-07-17 LAB — CBC
HCT: 46.5 % (ref 39.0–52.0)
Hemoglobin: 15.5 g/dL (ref 13.0–17.0)
MCH: 28.9 pg (ref 26.0–34.0)
MCHC: 33.3 g/dL (ref 30.0–36.0)
MCV: 86.6 fL (ref 80.0–100.0)
Platelets: 474 10*3/uL — ABNORMAL HIGH (ref 150–400)
RBC: 5.37 MIL/uL (ref 4.22–5.81)
RDW: 14.1 % (ref 11.5–15.5)
WBC: 21 10*3/uL — ABNORMAL HIGH (ref 4.0–10.5)
nRBC: 0 % (ref 0.0–0.2)

## 2023-07-17 LAB — COMPREHENSIVE METABOLIC PANEL
ALT: 11 U/L (ref 0–44)
AST: 14 U/L — ABNORMAL LOW (ref 15–41)
Albumin: 2.8 g/dL — ABNORMAL LOW (ref 3.5–5.0)
Alkaline Phosphatase: 60 U/L (ref 38–126)
Anion gap: 14 (ref 5–15)
BUN: 8 mg/dL (ref 6–20)
CO2: 17 mmol/L — ABNORMAL LOW (ref 22–32)
Calcium: 6.7 mg/dL — ABNORMAL LOW (ref 8.9–10.3)
Chloride: 103 mmol/L (ref 98–111)
Creatinine, Ser: 0.82 mg/dL (ref 0.61–1.24)
GFR, Estimated: >60 mL/min (ref >60)
Glucose, Bld: 197 mg/dL — ABNORMAL HIGH (ref 70–99)
Potassium: 4.2 mmol/L (ref 3.5–5.1)
Sodium: 134 mmol/L — ABNORMAL LOW (ref 135–145)
Total Bilirubin: 0.5 mg/dL (ref 0.3–1.2)
Total Protein: 6.2 g/dL — ABNORMAL LOW (ref 6.5–8.1)

## 2023-07-17 LAB — TYPE AND SCREEN
ABO/RH(D): A POS
Antibody Screen: NEGATIVE

## 2023-07-17 LAB — LIPASE, BLOOD: Lipase: 454 U/L — ABNORMAL HIGH (ref 11–51)

## 2023-07-17 MED ORDER — ONDANSETRON HCL 4 MG PO TABS: 4.0000 mg | ORAL_TABLET | Freq: Four times a day (QID) | ORAL | Status: DC | PRN

## 2023-07-17 MED ORDER — HYDROMORPHONE HCL 1 MG/ML IJ SOLN
1.0000 mg | INTRAMUSCULAR | Status: DC | PRN
  Administered 2023-07-17 – 2023-07-18 (×7): 1 mg via INTRAVENOUS
  Filled 2023-07-17 (×7): qty 1

## 2023-07-17 MED ORDER — ENOXAPARIN SODIUM 40 MG/0.4ML IJ SOSY
40.0000 mg | PREFILLED_SYRINGE | INTRAMUSCULAR | Status: DC
  Administered 2023-07-17 – 2023-07-23 (×7): 40 mg via SUBCUTANEOUS
  Filled 2023-07-17 (×7): qty 0.4

## 2023-07-17 MED ORDER — SODIUM CHLORIDE 0.9 % IV SOLN: Freq: Once | INTRAVENOUS | Status: AC

## 2023-07-17 MED ORDER — MORPHINE SULFATE (PF) 4 MG/ML IV SOLN
4.0000 mg | Freq: Once | INTRAVENOUS | Status: AC
  Administered 2023-07-17: 4 mg via INTRAVENOUS
  Filled 2023-07-17: qty 1

## 2023-07-17 MED ORDER — MORPHINE SULFATE (PF) 4 MG/ML IV SOLN
4.0000 mg | Freq: Once | INTRAVENOUS | Status: AC | PRN
  Administered 2023-07-17: 4 mg via INTRAVENOUS
  Filled 2023-07-17: qty 1

## 2023-07-17 MED ORDER — ONDANSETRON HCL 4 MG/2ML IJ SOLN
4.0000 mg | Freq: Four times a day (QID) | INTRAMUSCULAR | Status: DC | PRN
  Administered 2023-07-18 (×2): 4 mg via INTRAVENOUS
  Filled 2023-07-17 (×2): qty 2

## 2023-07-17 MED ORDER — ONDANSETRON HCL 4 MG/2ML IJ SOLN
4.0000 mg | Freq: Once | INTRAMUSCULAR | Status: AC
  Administered 2023-07-17: 4 mg via INTRAVENOUS
  Filled 2023-07-17: qty 2

## 2023-07-17 MED ORDER — DEXTROSE IN LACTATED RINGERS 5 % IV SOLN: INTRAVENOUS | Status: DC

## 2023-07-17 MED ORDER — KETOROLAC TROMETHAMINE 15 MG/ML IJ SOLN
15.0000 mg | Freq: Once | INTRAMUSCULAR | Status: AC
  Administered 2023-07-17: 15 mg via INTRAVENOUS
  Filled 2023-07-17: qty 1

## 2023-07-17 MED ORDER — IOHEXOL 300 MG/ML  SOLN
100.0000 mL | Freq: Once | INTRAMUSCULAR | Status: AC | PRN
  Administered 2023-07-17: 100 mL via INTRAVENOUS

## 2023-07-17 MED ORDER — INSULIN ASPART 100 UNIT/ML IJ SOLN
0.0000 [IU] | INTRAMUSCULAR | Status: DC
  Administered 2023-07-18: 1 [IU] via SUBCUTANEOUS
  Administered 2023-07-18: 2 [IU] via SUBCUTANEOUS
  Administered 2023-07-18: 7 [IU] via SUBCUTANEOUS
  Administered 2023-07-18: 5 [IU] via SUBCUTANEOUS
  Administered 2023-07-18 – 2023-07-19 (×2): 2 [IU] via SUBCUTANEOUS
  Filled 2023-07-17 (×6): qty 1

## 2023-07-17 NOTE — ED Notes (Signed)
RN received called pt vomiting up more blood. EDP made aware. This RN, Geographical information systems officer and EDP at bedside to assess pt.

## 2023-07-17 NOTE — ED Notes (Signed)
RN at bedside. Pt reports increased pain. Pt HR also noted to remain in the 130s. EDP made aware.

## 2023-07-17 NOTE — ED Notes (Signed)
Pt family came to CPOD. Family advised they could not find the call bell and he is vomiting blood. This RN called the assigned Rn and she was made aware.

## 2023-07-17 NOTE — ED Notes (Signed)
Pt transported to CT ?

## 2023-07-17 NOTE — Progress Notes (Signed)
Critical value ph 7.16 called to RN Brittney M. From VBG results.

## 2023-07-17 NOTE — ED Notes (Signed)
This nurse is requesting something for pain since dilaudid was given a hr ago at this time by instant message. Waiting for a response from the provider.

## 2023-07-17 NOTE — ED Triage Notes (Signed)
Pt here with abd pain that started this morning. Pt states the pain is all over and sharp in nature. Pt tachy on arrival to ED as well. Pt endorses NVD and vomit was green and yellow in color.

## 2023-07-17 NOTE — ED Provider Notes (Signed)
[facility] Provider Note    Event Date/Time   First MD Initiated Contact with Patient 07/17/23 1504     (approximate)   History   Abdominal Pain   HPI  Shane Angelopoulos Sr. is a 37 y.o. male with a history of alcohol induced pancreatitis, currently sober for over 6 months although review of records demonstrates the patient was admitted on August 13 for pancreatitis.  Had ascites noted on CT scan thought to be possibly related to pancreatitis rather than liver disease.  Patient presents with left upper abdominal pain similar to last presentation for pancreatitis, he does report that his son excellently bumped into his abdomen last night.     Physical Exam   Triage Vital Signs: ED Triage Vitals  Encounter Vitals Group     BP 07/17/23 1357 121/87     Systolic BP Percentile --      Diastolic BP Percentile --      Pulse Rate 07/17/23 1357 (!) 147     Resp 07/17/23 1357 18     Temp 07/17/23 1408 97.7 F (36.5 C)     Temp src --      SpO2 07/17/23 1357 100 %     Weight 07/17/23 1357 65.8 kg (145 lb 1 oz)     Height 07/17/23 1357 1.854 m (6\' 1" )     Head Circumference --      Peak Flow --      Pain Score 07/17/23 1357 10     Pain Loc --      Pain Education --      Exclude from Growth Chart --     Most recent vital signs: Vitals:   07/17/23 1730 07/17/23 1800  BP: (!) 118/90 115/89  Pulse: (!) 132 (!) 133  Resp: (!) 36 (!) 32  Temp:    SpO2: 99% 99%     General: Awake, no distress.  CV:  Good peripheral perfusion.  Resp:  Normal effort.  Abd:  No distention.  Tenderness epigastrically, left upper quadrant and, suspicious for ascites as well Other:     ED Results / Procedures / Treatments   Labs (all labs ordered are listed, but only abnormal results are displayed) Labs Reviewed  LIPASE, BLOOD - Abnormal; Notable for the following components:      Result Value   Lipase 454 (*)    All other components within normal limits  COMPREHENSIVE  METABOLIC PANEL - Abnormal; Notable for the following components:   Sodium 134 (*)    CO2 17 (*)    Glucose, Bld 197 (*)    Calcium 6.7 (*)    Total Protein 6.2 (*)    Albumin 2.8 (*)    AST 14 (*)    All other components within normal limits  CBC - Abnormal; Notable for the following components:   WBC 21.0 (*)    Platelets 474 (*)    All other components within normal limits  URINALYSIS, ROUTINE W REFLEX MICROSCOPIC  TYPE AND SCREEN  TYPE AND SCREEN     EKG     RADIOLOGY Ct scan not significantly changed from before    PROCEDURES:  Critical Care performed:   Procedures   MEDICATIONS ORDERED IN ED: Medications  morphine (PF) 4 MG/ML injection 4 mg (4 mg Intravenous Given 07/17/23 1526)  ondansetron (ZOFRAN) injection 4 mg (4 mg Intravenous Given 07/17/23 1526)  0.9 %  sodium chloride infusion (0 mLs Intravenous Stopped 07/17/23 1647)  iohexol (OMNIPAQUE)  300 MG/ML solution 100 mL (100 mLs Intravenous Contrast Given 07/17/23 1547)  morphine (PF) 4 MG/ML injection 4 mg (4 mg Intravenous Given 07/17/23 1615)  morphine (PF) 4 MG/ML injection 4 mg (4 mg Intravenous Given 07/17/23 1735)     IMPRESSION / MDM / ASSESSMENT AND PLAN / ED COURSE  I reviewed the triage vital signs and the nursing notes. Patient's presentation is most consistent with acute presentation with potential threat to life or bodily function.  Patient presents with abdominal pain as detailed above, he is markedly tachycardic on arrival  although in the room his heart rate is in the 120s given location of his pain and his history suspicious for pancreatitis, likely ascites as well.  He is having nausea and vomiting although nonbloody  IV morphine, IV Zofran, IV fluids ordered, lab demonstrates elevated lipase of 454, will send for CT given increased pain    CT scan is not significantly changed from prior,  Have discussed with the hospitalist for admission       FINAL CLINICAL IMPRESSION(S) / ED  DIAGNOSES   Final diagnoses:  Alcohol-induced acute pancreatitis, unspecified complication status     Rx / DC Orders   ED Discharge Orders     None        Note:  This document was prepared using Dragon voice recognition software and may include unintentional dictation errors.   Jene Every, MD 07/17/23 [phone]

## 2023-07-17 NOTE — ED Notes (Addendum)
While RNs at bedside to start IV when pt vomited with small amount of bright red blood noted. Type and screen collected. EDP made aware. Pt stating first time emesis has had blood in it.

## 2023-07-17 NOTE — ED Notes (Signed)
Blood bank reporting type and screen hemolyzed. Lab called to recollect.

## 2023-07-17 NOTE — Progress Notes (Signed)
       CROSS COVER NOTE  NAME: Shane Jenkins. MRN: [id number] DOB : [birth year]    Concern as stated by nurse / staff    Patient only able to void 30 ml bladder scan volume elevated above 400    Pertinent findings on chart review: Blood sugar above 180 (197)  Admitted severe acute on chronic pancreatitis meeting SIRS criteria.  CT abdomen reports no new changes on pancreas, ascites present.  WBC  21.1 and lipase 444. Bicarb low on metpane;   Assessment and  Interventions   Assessment:    07/17/2023    9:49 PM 07/17/2023    9:09 PM 07/17/2023    8:35 PM  Vitals with BMI  Systolic 111 104 90  Diastolic 68 85 68  Pulse 139 136 138    Plan: Bladder scan volume likely alsely elevated due to ascites. Continue IV fluids and reeval urine output in 5 h CBG monitoring and low dose insulin SQ for  glycemic control Consider paracentesis to rule out SBP  vbg       Donnie Mesa NP [facility] Cross Cover 7pm-7am - check amion for availability Pager [phone]

## 2023-07-17 NOTE — ED Notes (Signed)
Inquired to provider about VS to initiate sepsis protocol-no new orders at this time

## 2023-07-17 NOTE — H&P (Signed)
History and Physical    Patient: Shane Jenkins ZHY:[id number] DOB: Mar 01, 1986 DOA: 07/17/2023 DOS: the patient was seen and examined on 07/17/2023 PCP: Pediatrics, [facility]  Patient coming from: Home  Chief Complaint:  Chief Complaint  Patient presents with   Abdominal Pain   HPI: Shane Estime Sr. is a 37 y.o. male with medical history significant of prior history of alcoholism who actually quit a few months ago, tobacco abuse, prior history of pancreatitis who presents to the ER with abdominal pain and nausea.  Patient was apparently here in August on August 13 with pancreatitis.  At the time he had ascites which was thought to be due to pancreatitis.  Patient did not have liver disease.  He was seen in the ER and evaluated.  Patient appears to have lipase of 454.  Glucose 197 and sodium 134.  Also leukocytosis and significant tachycardia with heart rate up to 140s. CT abdomen and pelvis showed stable changes of chronic pancreatitis with probable tiny pseudocyst in the uncinate process.  There is multiple multiloculated fluid collection throughout the abdomen also small bowel ileus.  Patient is being admitted with severe pancreatitis.  Appears to be acute on chronic as patient has quit drinking.  Review of Systems: As mentioned in the history of present illness. All other systems reviewed and are negative. Past Medical History:  Diagnosis Date   Alcohol use disorder, severe, dependence (HCC)    No past surgical history on file. Social History:  reports that he has been smoking. He has never used smokeless tobacco. He reports that he does not currently use alcohol. He reports that he does not currently use drugs.  Allergies  Allergen Reactions   Oxycodone Rash    Rash/itching which required benadryl to resolve   Sudafed [Pseudoephedrine]     History per pt of this being linked with his seizures    Family History  Problem Relation Age of Onset   Alcohol abuse Mother      Prior to Admission medications   Medication Sig Start Date End Date Taking? Authorizing Provider  cyanocobalamin 1000 MCG tablet Take 1 tablet (1,000 mcg total) by mouth daily. 07/06/23 08/05/23  Charise Killian, MD  lipase/protease/amylase (CREON) 36000 UNITS CPEP capsule Take 2 capsules (72,000 Units total) by mouth 3 (three) times daily with meals. 07/05/23 08/04/23  Charise Killian, MD  pantoprazole (PROTONIX) 40 MG tablet Take 1 tablet (40 mg total) by mouth daily. 07/06/23 08/05/23  Charise Killian, MD    Physical Exam: Vitals:   07/17/23 1632 07/17/23 1700 07/17/23 1730 07/17/23 1800  BP: 113/81 112/88 (!) 118/90 115/89  Pulse: (!) 126 (!) 132 (!) 132 (!) 133  Resp: (!) 30 (!) 34 (!) 36 (!) 32  Temp:      SpO2: 100% 100% 99% 99%  Weight:      Height:       Constitutional: Acutely ill looking in mild distress,  Eyes: PERRL, lids and conjunctivae normal ENMT: Mucous membranes are moist. Posterior pharynx clear of any exudate or lesions.Normal dentition.  Neck: normal, supple, no masses, no thyromegaly Respiratory: clear to auscultation bilaterally, no wheezing, no crackles. Normal respiratory effort. No accessory muscle use.  Cardiovascular: Sinus tachycardia, no murmurs / rubs / gallops. No extremity edema. 2+ pedal pulses. No carotid bruits.  Abdomen: Distended, tender, no hepatosplenomegaly. Bowel sounds positive.  Musculoskeletal: Good range of motion, no joint swelling or tenderness, Skin: no rashes, lesions, ulcers. No induration Neurologic: CN  2-12 grossly intact. Sensation intact, DTR normal. Strength 5/5 in all 4.  Psychiatric: Normal judgment and insight. Alert and oriented x 3. Normal mood  Data Reviewed:  Sodium 134 CO2 17 glucose 197, calcium 6.7 albumin 2.8 lipase 454 AST 40 total protein 6.2.  White count 21 and platelets 474 CT abdomen pelvis shows stable changes of chronic pancreatitis with probable tiny pseudocyst in the uncinate process and body  noted.  No radiographic signs of acute pancreatitis.  Also multiply multiloculated fluid collection throughout the abdomen and moderate large amount of ascites showing mild peritoneal enhancement.  This findings are consistent with pancreatic pseudocyst with no significant change in mild small bowel ileus and diffuse small bowel wall enteritis which may be reactive.  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  Assessment and Plan:  #1 severe acute on chronic pancreatitis with SIRS: Patient will be admitted and be monitored.  Patient will need to go to progressive care due to severe pancreatitis.  By Ranson's criteria he qualified for severe pancreatitis with hypocalcemia also noted.  Follow lipase levels.  Pain management as well as IV fluids.  Aggressive fluids 8 to 50 cc an hour of LR.  #2 hypertensive urgency:.  As needed blood pressure medications.  #3 hyponatremia: Stable.  #4 severe malnutrition: Improved.  Secondary to chronic pancreatitis.  #5 pancreatic insufficiency: Patient will need pancreatic enzymes once p.o. intake starts.    Advance Care Planning:   Code Status: Full Code   Consults: None  Family Communication: Significant other in the room  Severity of Illness: The appropriate patient status for this patient is INPATIENT. Inpatient status is judged to be reasonable and necessary in order to provide the required intensity of service to ensure the patient's safety. The patient's presenting symptoms, physical exam findings, and initial radiographic and laboratory data in the context of their chronic comorbidities is felt to place them at high risk for further clinical deterioration. Furthermore, it is not anticipated that the patient will be medically stable for discharge from the hospital within 2 midnights of admission.   * I certify that at the point of admission it is my clinical judgment that the patient will require inpatient  hospital care spanning beyond 2 midnights from the point of admission due to high intensity of service, high risk for further deterioration and high frequency of surveillance required.*  AuthorLonia Blood, MD 07/17/2023 6:58 PM  For on call review [email or web address].

## 2023-07-17 NOTE — ED Notes (Signed)
Per provider (Dr. Mikeal Hawthorne), increase d5lr to 228ml/hr

## 2023-07-18 ENCOUNTER — Inpatient Hospital Stay: Payer: Medicaid Other

## 2023-07-18 DIAGNOSIS — K8521 Alcohol induced acute pancreatitis with uninfected necrosis: Secondary | ICD-10-CM

## 2023-07-18 DIAGNOSIS — R188 Other ascites: Secondary | ICD-10-CM | POA: Diagnosis not present

## 2023-07-18 DIAGNOSIS — K860 Alcohol-induced chronic pancreatitis: Secondary | ICD-10-CM | POA: Diagnosis not present

## 2023-07-18 DIAGNOSIS — F1020 Alcohol dependence, uncomplicated: Secondary | ICD-10-CM | POA: Diagnosis not present

## 2023-07-18 DIAGNOSIS — A419 Sepsis, unspecified organism: Secondary | ICD-10-CM | POA: Diagnosis not present

## 2023-07-18 DIAGNOSIS — R140 Abdominal distension (gaseous): Secondary | ICD-10-CM

## 2023-07-18 DIAGNOSIS — R6520 Severe sepsis without septic shock: Secondary | ICD-10-CM

## 2023-07-18 DIAGNOSIS — K652 Spontaneous bacterial peritonitis: Secondary | ICD-10-CM | POA: Diagnosis not present

## 2023-07-18 DIAGNOSIS — D75838 Other thrombocytosis: Secondary | ICD-10-CM | POA: Insufficient documentation

## 2023-07-18 DIAGNOSIS — E872 Acidosis, unspecified: Secondary | ICD-10-CM | POA: Insufficient documentation

## 2023-07-18 LAB — LACTIC ACID, PLASMA
Lactic Acid, Venous: 3 mmol/L (ref 0.5–1.9)
Lactic Acid, Venous: 3.1 mmol/L (ref 0.5–1.9)
Lactic Acid, Venous: 3.7 mmol/L (ref 0.5–1.9)

## 2023-07-18 LAB — URINALYSIS, ROUTINE W REFLEX MICROSCOPIC
Bilirubin Urine: NEGATIVE
Glucose, UA: NEGATIVE mg/dL
Ketones, ur: NEGATIVE mg/dL
Leukocytes,Ua: NEGATIVE
Nitrite: NEGATIVE
Protein, ur: 30 mg/dL — AB
Specific Gravity, Urine: >1.046 — ABNORMAL HIGH (ref 1.005–1.030)
Squamous Epithelial / HPF: NONE SEEN /HPF (ref 0–5)
pH: 5 (ref 5.0–8.0)

## 2023-07-18 LAB — COMPREHENSIVE METABOLIC PANEL
ALT: 8 U/L (ref 0–44)
AST: 24 U/L (ref 15–41)
Albumin: 1.9 g/dL — ABNORMAL LOW (ref 3.5–5.0)
Alkaline Phosphatase: 50 U/L (ref 38–126)
Anion gap: 13 (ref 5–15)
BUN: 15 mg/dL (ref 6–20)
CO2: 16 mmol/L — ABNORMAL LOW (ref 22–32)
Calcium: 4.1 mg/dL — CL (ref 8.9–10.3)
Chloride: 105 mmol/L (ref 98–111)
Creatinine, Ser: 1.45 mg/dL — ABNORMAL HIGH (ref 0.61–1.24)
GFR, Estimated: >60 mL/min (ref >60)
Glucose, Bld: 136 mg/dL — ABNORMAL HIGH (ref 70–99)
Potassium: 5 mmol/L (ref 3.5–5.1)
Sodium: 134 mmol/L — ABNORMAL LOW (ref 135–145)
Total Bilirubin: 0.3 mg/dL (ref 0.3–1.2)
Total Protein: 4.8 g/dL — ABNORMAL LOW (ref 6.5–8.1)

## 2023-07-18 LAB — HEMOGLOBIN A1C
Hgb A1c MFr Bld: 5.2 % (ref 4.8–5.6)
Mean Plasma Glucose: 102.54 mg/dL

## 2023-07-18 LAB — GLUCOSE, CAPILLARY
Glucose-Capillary: 136 mg/dL — ABNORMAL HIGH (ref 70–99)
Glucose-Capillary: 155 mg/dL — ABNORMAL HIGH (ref 70–99)
Glucose-Capillary: 161 mg/dL — ABNORMAL HIGH (ref 70–99)
Glucose-Capillary: 178 mg/dL — ABNORMAL HIGH (ref 70–99)
Glucose-Capillary: 255 mg/dL — ABNORMAL HIGH (ref 70–99)
Glucose-Capillary: 335 mg/dL — ABNORMAL HIGH (ref 70–99)

## 2023-07-18 LAB — CBC
HCT: 53.1 % — ABNORMAL HIGH (ref 39.0–52.0)
Hemoglobin: 17 g/dL (ref 13.0–17.0)
MCH: 28.4 pg (ref 26.0–34.0)
MCHC: 32 g/dL (ref 30.0–36.0)
MCV: 88.8 fL (ref 80.0–100.0)
Platelets: 468 10*3/uL — ABNORMAL HIGH (ref 150–400)
RBC: 5.98 MIL/uL — ABNORMAL HIGH (ref 4.22–5.81)
RDW: 14.4 % (ref 11.5–15.5)
WBC: 25.4 10*3/uL — ABNORMAL HIGH (ref 4.0–10.5)
nRBC: 0 % (ref 0.0–0.2)

## 2023-07-18 LAB — BLOOD GAS, VENOUS
Acid-base deficit: 9.2 mmol/L — ABNORMAL HIGH (ref 0.0–2.0)
Bicarbonate: 18 mmol/L — ABNORMAL LOW (ref 20.0–28.0)
O2 Saturation: 85.2 %
Patient temperature: 37
pCO2, Ven: 43 mmHg — ABNORMAL LOW (ref 44–60)
pH, Ven: 7.23 — ABNORMAL LOW (ref 7.25–7.43)
pO2, Ven: 54 mmHg — ABNORMAL HIGH (ref 32–45)

## 2023-07-18 LAB — LIPASE, BLOOD: Lipase: 246 U/L — ABNORMAL HIGH (ref 11–51)

## 2023-07-18 LAB — MAGNESIUM: Magnesium: 1.6 mg/dL — ABNORMAL LOW (ref 1.7–2.4)

## 2023-07-18 MED ORDER — VITAMIN B-12 1000 MCG PO TABS
1000.0000 ug | ORAL_TABLET | Freq: Every day | ORAL | Status: DC
  Filled 2023-07-18: qty 1

## 2023-07-18 MED ORDER — CALCIUM GLUCONATE-NACL 1-0.675 GM/50ML-% IV SOLN
1.0000 g | Freq: Once | INTRAVENOUS | Status: AC
  Administered 2023-07-18: 1000 mg via INTRAVENOUS
  Filled 2023-07-18 (×2): qty 50

## 2023-07-18 MED ORDER — SODIUM CHLORIDE 0.9 % IV SOLN
2.0000 g | Freq: Two times a day (BID) | INTRAVENOUS | Status: DC
  Administered 2023-07-18 (×3): 2 g via INTRAVENOUS
  Filled 2023-07-18 (×4): qty 20

## 2023-07-18 MED ORDER — SODIUM CHLORIDE 0.9 % IV BOLUS
500.0000 mL | Freq: Once | INTRAVENOUS | Status: AC
  Administered 2023-07-18: 500 mL via INTRAVENOUS

## 2023-07-18 MED ORDER — DICYCLOMINE HCL 20 MG PO TABS
20.0000 mg | ORAL_TABLET | Freq: Once | ORAL | Status: DC
  Filled 2023-07-18: qty 1

## 2023-07-18 MED ORDER — PANTOPRAZOLE SODIUM 40 MG IV SOLR
40.0000 mg | INTRAVENOUS | Status: DC
  Administered 2023-07-18 – 2023-07-23 (×6): 40 mg via INTRAVENOUS
  Filled 2023-07-18 (×6): qty 10

## 2023-07-18 MED ORDER — HYDROMORPHONE HCL 1 MG/ML IJ SOLN
1.0000 mg | INTRAMUSCULAR | Status: DC | PRN
  Administered 2023-07-18: 1 mg via INTRAVENOUS
  Filled 2023-07-18: qty 1

## 2023-07-18 MED ORDER — GLUCERNA SHAKE PO LIQD: 237.0000 mL | Freq: Two times a day (BID) | ORAL | Status: DC

## 2023-07-18 MED ORDER — HYDROMORPHONE HCL 1 MG/ML IJ SOLN
1.0000 mg | INTRAMUSCULAR | Status: DC | PRN
  Administered 2023-07-18 – 2023-07-20 (×13): 1 mg via INTRAVENOUS
  Filled 2023-07-18 (×14): qty 1

## 2023-07-18 MED ORDER — STERILE WATER FOR INJECTION IV SOLN
INTRAVENOUS | Status: DC
  Filled 2023-07-18 (×2): qty 1000
  Filled 2023-07-18: qty 150

## 2023-07-18 MED ORDER — MAGNESIUM SULFATE 4 GM/100ML IV SOLN
4.0000 g | Freq: Once | INTRAVENOUS | Status: AC
  Administered 2023-07-18: 4 g via INTRAVENOUS
  Filled 2023-07-18: qty 100

## 2023-07-18 MED ORDER — POLYETHYLENE GLYCOL 3350 17 G PO PACK
17.0000 g | PACK | Freq: Every day | ORAL | Status: DC
  Administered 2023-07-18 – 2023-07-22 (×5): 17 g via ORAL
  Filled 2023-07-18 (×5): qty 1

## 2023-07-18 MED ORDER — PANCRELIPASE (LIP-PROT-AMYL) 12000-38000 UNITS PO CPEP
72000.0000 [IU] | ORAL_CAPSULE | Freq: Three times a day (TID) | ORAL | Status: DC
  Administered 2023-07-23 – 2023-07-28 (×11): 72000 [IU] via ORAL
  Filled 2023-07-18 (×23): qty 2
  Filled 2023-07-18 (×2): qty 6
  Filled 2023-07-18: qty 2
  Filled 2023-07-18: qty 6
  Filled 2023-07-18 (×4): qty 2

## 2023-07-18 MED ORDER — SODIUM CHLORIDE 0.9 % IV SOLN: INTRAVENOUS | Status: DC

## 2023-07-18 NOTE — Consult Note (Signed)
Shane Repress, MD [address]  [address]  [place], [place] [address]  Main: [phone]  Fax: [phone] Pager: [phone]   Consultation  Referring Provider:     No ref. provider found Primary Care Physician:  Pediatrics, [facility]       Reason for Consultation: Pancreatic ascites  Date of Admission:  07/17/2023 Date of Consultation:  07/18/2023         HPI:   Shane Jenkins Sr. is a 37 y.o. 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  Patient was recently admitted to [facility] from mid August to end of August secondary to worsening of abdominal pain from pancreatitis and pancreatic ascites.  Patient underwent therapeutic paracentesis, he was also started on TPN and octreotide, gradually transitioned to p.o.  Patient was discharged home on 8/25.  He presented to ER on 8/30 due to worsening of abdominal distention, underwent paracentesis with 4 L of hazy fluid removed.  Presented to ER again yesterday due to worsening of abdominal distention resulting in severe abdominal pain, nausea and vomiting.  He underwent cross-sectional imaging yesterday which revealed stable changes of chronic pancreatitis, tiny pseudocyst, multiple multiloculated fluid collections throughout the abdomen, moderate to large amount of ascites showing mild peritoneal enhancement.  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   Current Facility-Administered Medications:    cefTRIAXone (ROCEPHIN) 2 g in sodium  chloride 0.9 % 100 mL IVPB, 2 g, Intravenous, Q12H, Manuela Schwartz, NP, Last Rate: 200 mL/hr at 07/18/23 1034, 2 g at 07/18/23 1034   cyanocobalamin (VITAMIN B12) tablet 1,000 mcg, 1,000 mcg, Oral, Daily, Marrion Coy, MD   dicyclomine (BENTYL) tablet 20 mg, 20 mg, Oral, Once, Manuela Schwartz, NP   enoxaparin (LOVENOX) injection 40 mg, 40 mg, Subcutaneous, Q24H, Garba, Mohammad L, MD, 40 mg at 07/17/23 2112   feeding supplement (GLUCERNA SHAKE) (GLUCERNA SHAKE) liquid 237 mL, 237 mL, Oral, BID BM, Marrion Coy, MD   HYDROmorphone (DILAUDID) injection 1 mg, 1 mg, Intravenous, Q2H PRN, Marrion Coy, MD, 1 mg at 07/18/23 1739   insulin aspart (novoLOG) injection 0-9 Units, 0-9 Units, Subcutaneous, Q4H, Manuela Schwartz, NP, 1 Units at 07/18/23 1736   lipase/protease/amylase (CREON) capsule 72,000 Units, 72,000 Units, Oral, TID WC, Marrion Coy, MD   ondansetron (ZOFRAN) tablet 4 mg, 4 mg, Oral, Q6H PRN **OR** ondansetron (ZOFRAN) injection 4 mg, 4 mg, Intravenous, Q6H PRN, Mikeal Hawthorne, Mohammad L, MD, 4 mg at 07/18/23 1347   pantoprazole (PROTONIX) injection 40 mg, 40 mg, Intravenous, Q24H, Manuela Schwartz, NP, 40 mg at 07/18/23 3664   sodium bicarbonate 150 mEq in sterile water 1,150 mL infusion, , Intravenous, Continuous, Marrion Coy, MD, Last Rate: 75 mL/hr at 07/18/23 1537, New Bag at 07/18/23 1537   Family History  Problem Relation Age of Onset   Alcohol abuse Mother      Social History   Tobacco Use   Smoking status: Every Day   Smokeless tobacco: Never  Substance Use Topics   Alcohol use: Not Currently    Comment: none since June  2021   Drug use: Not Currently    Allergies as of 07/17/2023 - Review Complete 07/17/2023  Allergen Reaction Noted   Oxycodone Rash 01/02/2021   Sudafed [pseudoephedrine]  05/03/2020    Review of Systems:    All systems reviewed and negative except where noted in HPI.   Physical Exam:  Vital signs in last 24 hours: Temp:  [96.9 F (36.1 C)-99 F  (37.2 C)] 99 F (37.2 C) (09/07 0748) Pulse Rate:  [119-141] 119 (09/07 1608) Resp:  [16-41] 16 (09/07 1608) BP: (90-118)/(68-90) 111/79 (09/07 1608) SpO2:  [97 %-100 %] 98 % (09/07 1608) Last BM Date : 07/15/23 (PTA) General: Well-nourished, ill-appearing, cooperative in NAD Head:  Normocephalic and atraumatic. Eyes:   No icterus.   Conjunctiva pink. PERRLA. Ears:  Normal auditory acuity. Neck:  Supple; no masses or thyroidomegaly Lungs: Respirations even and unlabored. Lungs clear to auscultation bilaterally.   No wheezes, crackles, or rhonchi.  Heart:  Regular rate and rhythm;  Without murmur, clicks, rubs or gallops Abdomen:  tense abdomen, distended, diffuse tenderness, tympanic to percussion, No appreciable masses or hepatomegaly.  No rebound or guarding.  Rectal:  Not performed. Msk:  Symmetrical without gross deformities.  Strength generalized weakness Extremities:  Without edema, cyanosis or clubbing. Neurologic:  Alert and oriented x3;  grossly normal neurologically. Skin:  Intact without significant lesions or rashes. Alcoholic pancreatitis complicated by chronic pancreatitis, pancreatic ascitesPsych:  Alert and cooperative. Normal affect.  LAB RESULTS:    Latest Ref Rng & Units 07/18/2023    6:49 AM 07/17/2023    2:08 PM 07/10/2023    7:29 AM  CBC  WBC 4.0 - 10.5 K/uL 25.4  21.0  7.4   Hemoglobin 13.0 - 17.0 g/dL 16.1  09.6  04.5   Hematocrit 39.0 - 52.0 % 53.1  46.5  39.4   Platelets 150 - 400 K/uL 468  474  326     BMET    Latest Ref Rng & Units 07/18/2023    6:49 AM 07/17/2023    2:08 PM 07/10/2023    7:29 AM  BMP  Glucose 70 - 99 mg/dL 409  811  98   BUN 6 - 20 mg/dL 15  8  5    Creatinine 0.61 - 1.24 mg/dL 9.14  7.82  9.56   Sodium 135 - 145 mmol/L 134  134  136   Potassium 3.5 - 5.1 mmol/L 5.0  4.2  4.1   Chloride 98 - 111 mmol/L 105  103  103   CO2 22 - 32 mmol/L 16  17  25    Calcium 8.9 - 10.3 mg/dL 4.1  6.7  8.5     LFT    Latest Ref Rng & Units  07/18/2023    6:49 AM 07/17/2023    2:08 PM 07/10/2023    7:29 AM  Hepatic Function  Total Protein 6.5 - 8.1 g/dL 4.8  6.2  6.0   Albumin 3.5 - 5.0 g/dL 1.9  2.8  2.7   AST 15 - 41 U/L 24  14  15    ALT 0 - 44 U/L 8  11  15    Alk Phosphatase 38 - 126 U/L 50  60  60   Total Bilirubin 0.3 - 1.2 mg/dL 0.3  0.5  0.3      STUDIES: DG ABD ACUTE 2+V W 1V CHEST  Result Date: 07/18/2023 CLINICAL DATA:  Abdominal pain and nausea. EXAM: DG ABDOMEN ACUTE WITH 1 VIEW CHEST COMPARISON:  Chest  radiograph on 01/01/2021 FINDINGS: There is no evidence of dilated bowel loops or free intraperitoneal air. No radiopaque calculi or other significant radiographic abnormality is seen. Contrast seen in urinary bladder from recent CT. Heart size and mediastinal contours are within normal limits. Mild scarring again noted in left lung base. No No evidence of acute infiltrate or pleural effusion. IMPRESSION: Unremarkable bowel gas pattern. Mild left basilar scarring.  No active cardiopulmonary disease. Electronically Signed   By: Danae Orleans M.D.   On: 07/18/2023 10:28   CT ABDOMEN PELVIS W CONTRAST  Result Date: 07/17/2023 CLINICAL DATA:  Severe abdominal pain beginning this morning. Nausea and vomiting diarrhea. Tachycardia. Pancreatitis. EXAM: CT ABDOMEN AND PELVIS WITH CONTRAST TECHNIQUE: Multidetector CT imaging of the abdomen and pelvis was performed using the standard protocol following bolus administration of intravenous contrast. RADIATION DOSE REDUCTION: This exam was performed according to the departmental dose-optimization program which includes automated exposure control, adjustment of the mA and/or kV according to patient size and/or use of iterative reconstruction technique. CONTRAST:  OMNIPAQUE IOHEXOL 300 MG/ML  SOLN COMPARISON:  06/23/2023 FINDINGS: Lower Chest: No acute findings. Hepatobiliary: No suspicious hepatic masses identified. Gallbladder is unremarkable. No evidence of biliary ductal dilatation.  Pancreas: Mild dilatation and irregularity of the pancreatic duct is again seen, consistent with chronic pancreatitis. No pancreatic calcifications noted. Few small cystic lesions are again seen in the uncinate process and body, largest measuring 12 mm in the uncinate process, which is mildly decreased from 17 mm on previous study. These are consistent with small pseudocysts. No pancreatic masses or acute peripancreatic inflammatory changes are seen. Multiple multiloculated fluid collections are again seen throughout the abdomen which show thin peripheral rim enhancement. Moderate large amount of ascites is again seen in the abdomen and pelvis showing mild peritoneal enhancement. These findings show no significant change in consistent with pseudocysts. Spleen: Within normal limits in size and appearance. Adrenals/Urinary Tract: No suspicious masses identified. No evidence of ureteral calculi or hydronephrosis. Stomach/Bowel: New small hiatal hernia noted. No evidence of bowel obstruction. Mild small bowel ileus is again noted with mild diffuse small bowel wall thickening, consistent with enteritis. Vascular/Lymphatic: No pathologically enlarged lymph nodes. No acute vascular findings. Chronic splenic vein thrombosis is again seen with numerous venous collaterals in the gastrosplenic ligament. Reproductive:  No mass or other significant abnormality. Other:  None. Musculoskeletal:  No suspicious bone lesions identified. IMPRESSION: Stable changes of chronic pancreatitis, with probable tiny pseudocysts in the uncinate process and body. No radiographic signs of acute pancreatitis. Multiple multiloculated fluid collections throughout the abdomen, and moderate to large amount of ascites showing mild peritoneal enhancement. These findings are consistent with pancreatic pseudocysts. No significant change in mild small bowel ileus, and diffuse small bowel wall enteritis which may be reactive. No evidence of bowel  obstruction. Chronic splenic vein thrombosis, with numerous venous collaterals in the gastrosplenic ligament. New small hiatal hernia. Electronically Signed   By: Danae Orleans M.D.   On: 07/17/2023 17:42      Impression / Plan:   Shane Kochan Sr. is a 37 y.o. male with alcoholic pancreatitis resulting in chronic pancreatitis, recurrent pancreatic ascites readmitted with worsening of abdominal distention, recurrence of pancreatic ascites  Abdominal distention with recurrence of pancreatic ascites Patient had paracentesis in the past which revealed significantly elevated amylase and lipase levels and ascitic fluid consistent with pancreatic ascites which is concerning for pancreatic duct disruption Patient is scheduled to undergo therapeutic paracentesis tomorrow Patient has an appointment with  [facility] advanced endoscopy fellow on 9/18 to discuss about management of pancreatic ascites, and possible PD stent placement Discussed with patient regarding putting him back on TPN versus enteral nutrition which may help reduce reaccumulation of ascitic fluid.  Patient is hesitant to start TPN On empiric antibiotic with ceftriaxone Discussed with Dr. Chipper Herb about the option of reviewing case with Dr. Meridee Score for inpatient transfer for EUS/ERCP and PD stent placement.  His case is currently under review Diet as tolerated Continue pancreatic enzyme replacement therapy   Thank you for involving me in the care of this patient.      LOS: 1 day   Lannette Donath, MD  07/18/2023, 4:09 PM    Note: This dictation was prepared with Dragon dictation along with smaller phrase technology. Any transcriptional errors that result from this process are unintentional.

## 2023-07-18 NOTE — Progress Notes (Signed)
OVN: Patient abdominal pain rated 6-9/10 with abdominal distention, see MAR for meds given. VSS, HR >120's ST. RA. Afebrile. WBC 21.0, VBG abnormal. lactate 3.1-3.7, repeat lactate drawn. IVABX ordered, blood cultures drawn. Mag replaced, mag 1.6. 500cc bolus given and IV maintenance fluids changed to NS. Low urine output, in and out cath 200cc. UA sent. Patient remains NPO sips with meds. Patient and spouse educated, plan of care continued.

## 2023-07-18 NOTE — Progress Notes (Addendum)
Progress Note   Patient: Shane Erker Sr. ZOX:[id number] DOB: 30-Nov-1985 DOA: 07/17/2023     1 DOS: the patient was seen and examined on 07/18/2023   Brief hospital course: Jamisen Steketee Sr. is a 37 y.o. male with medical history significant of prior history of alcoholism who actually quit a few months ago, tobacco abuse, prior history of pancreatitis who presents to the ER with abdominal pain and nausea.  Upon arriving the emergency room, her heart rate was 147, respiratory rate was 34, lab study significant leukocytosis of 21, lactic acid 3.7.  Patient also has significant abdominal tenderness with rebound tenderness.  CT abdomen/pelvis showed moderate to large ascites with peritoneal enhancement.  Small pancreatic pseudocyst. Patient was started on IV fluids, pain medicine.  Due to concern for peritonitis, patient was started on Rocephin.    Principal Problem:   Alcohol induced acute pancreatitis Active Problems:   Severe malnutrition (HCC)   Sinus tachycardia   Pancreatic cyst   Alcohol use disorder, moderate, dependence (HCC)   Hyponatremia   Thrombocytopenia (HCC)   Tobacco abuse   AKI (acute kidney injury) (HCC)   Protein-calorie malnutrition, severe   Pancreatitis   Hypomagnesemia   Spontaneous bacterial peritonitis (HCC)   Severe sepsis (HCC)   Reactive thrombocytosis   Assessment and Plan: Severe sepsis. Spontaneous bacterial peritonitis. Patient met sepsis criteria with significant tachycardia, tachypnea and severe leukocytosis.  Patient also has organ failure with acute kidney injury, lactic acidosis. Based on my examination, patient had a significant peritonitis, evidenced by significant tenderness and rebound tenderness.  I personally reviewed patient's CT scan images, there is no evidence of organ perforation.  Due to significant rebound tenderness, I repeated stat KUB/chest x-ray, still did not show any evidence of organ perforation.  Condition most likely due to  spontaneous bacterial peritonitis.  Patient is started on Rocephin, blood culture so far negative I will continue Rocephin for at least 7 days. Since patient already had antibiotics, paracentesis can be performed later, potentially Monday.  Alcoholic acute pancreatitis with chronic pancreatitis.  Small pseudocyst. Patient had increased lipase, consistent with pancreatitis.  Patient also had history of chronic diarrhea, will continue Creon.  Acute kidney injury. Hyponatremia. Metabolic acidosis. Hypomagnesemia. Hypocalcemia. Patient be treated with sodium bicarb drip.  Received 4 g of magnesium sulfate.  Also give 1 g of calcium gluconate.  Severe protein calorie malnutrition. Start protein supplements.  Reactive thrombocytosis. Secondary to sepsis, continue to follow.  Uncontrolled type 2 diabetes with hyperglycemia  Patient on sliding scale insulin.  Patient will be placed on liquid diet.  CODE STATUS. Discussed with the patient and wife, patient wishes to be full code.  1704.  Consulted Dr. Everlene Farrier and Dr. Allegra Lai, patient has pancreatic ascites with pancreatic duct obstruction, will need a pancreatic stent placement. I have reached out to Dr. Meridee Score, he will review the case, and let us know if can transfer to [facility]. If not, have to try [facility], patient has an appointment with [facility] advanced endoscopy on 9/18. At meanwhile, will obtain paracentesis to relieve the symptoms.    Subjective:  Patient still complaining of significant abdominal pain, but no nausea vomiting. No fever or chills.  Physical Exam: Vitals:   07/17/23 2343 07/18/23 0249 07/18/23 0328 07/18/23 0748  BP: 102/78 115/85 113/84 106/73  Pulse: (!) 128 (!) 120 (!) 123 (!) 119  Resp: 20  (!) 21 20  Temp: 97.6 F (36.4 C)  97.6 F (36.4 C) 99 F (37.2 C)  TempSrc:  Oral Oral  SpO2: 97%  97% 98%  Weight:      Height:       General exam: Appears ill, severely malnourished. Respiratory system: Clear to  auscultation. Respiratory effort normal. Cardiovascular system: S1 & S2 heard, RRR. No JVD, murmurs, rubs, gallops or clicks. No pedal edema. Gastrointestinal system: Abdomen is distended, firm and tender. No organomegaly or masses felt. Normal bowel sounds heard. Central nervous system: Alert and oriented. No focal neurological deficits. Extremities: Symmetric 5 x 5 power. Skin: No rashes, lesions or ulcers Psychiatry: Mood & affect appropriate.    Data Reviewed:  Reviewed CT scan results, KUB results.  Lab results.  Family Communication: Wife at bedside.  Disposition: Status is: Inpatient Remains inpatient appropriate because: Severity of disease, IV treatment.     Time spent: 55 minutes  Author: Marrion Coy, MD 07/18/2023 11:36 AM  For on call review [email or web address].

## 2023-07-18 NOTE — Hospital Course (Addendum)
Shane Hedquist Sr. is a 37 y.o. male with medical history significant of prior history of alcoholism who actually quit a few months ago, tobacco abuse, prior history of pancreatitis who presents to the ER with abdominal pain and nausea.  Upon arriving the emergency room, her heart rate was 147, respiratory rate was 34, lab study significant leukocytosis of 21, lactic acid 3.7.  Patient also has significant abdominal tenderness with rebound tenderness.  CT abdomen/pelvis showed moderate to large ascites with peritoneal enhancement.  Small pancreatic pseudocyst. Patient was started on IV fluids, pain medicine.  Due to concern for peritonitis, patient was started on Rocephin.  9/8.  5.8 L drawn off with paracentesis 9/11.  400 mL drawn off with paracentesis.  Confirmed with [facility] that the plan is to transfer over to [facility] for procedure starting at 10 AM. 9/12.  Patient seen prior to transfer to [facility] for ERCP procedure. 9/13.  Patient had more abdominal pain overnight.  Had a CT scan.  Which showed hemoperitoneum likely from last paracentesis.  Patient was started on empiric antibiotic.  Patient was evaluated this morning by me.  Then had an episode of dark reddish maroonish blood.  Patient already on IV Protonix.  Case discussed with general surgery and gastroenterology.  CT angio negative for acute bleeding.  Continue to monitor hemoglobin.  Last hemoglobin 8.0 and a unit of blood was ordered. 9/14.  Patient has not had a bloody bowel movement since yesterday.  Hemoglobin down to 7.5.  Will transfuse another unit of blood today. 9/15.  Patient's hemoglobin 8.3 this morning.  Continue to monitor closely. 9/16.  Patient still having a lot of soreness in his abdomen.  Received IV pain medication this morning.  Advised in order to go home we need to be off the IV pain medication.  Hemoglobin up to 8.7 today. 9/17.  Hemoglobin 8.6 today.  Still having soreness in his abdomen but willing to go home and follow-up as  outpatient.

## 2023-07-18 NOTE — Progress Notes (Signed)
37 yo male well known w recurrent pancreatitis w pancreatic ascites. Recently DC. [facility] was trying to arrange pancreatic stent if pt did not improve. It seems that he has reached this point. Unfortunately very complex situation. Typically pancreatic stent will be done first.. If unsuccessful pancreatic surgery might be his last option and obviously he will need tertiary center for all this. Please note that his liver is functional ans he does not have cirrhosis. Ascites likely related to a ruptured pseudocyst or pancreatic duct disruption. Full consult to follow

## 2023-07-18 NOTE — Progress Notes (Signed)
Patient was unable to void this afternoon and has been in severe pain throught the shift requiring pain medication q2hrs. Abdomen distended and tender to the touch. Limited movement due to pain. Notified md/ foly cath inserted, dilaudid ordered q2prn

## 2023-07-19 ENCOUNTER — Inpatient Hospital Stay: Payer: Medicaid Other

## 2023-07-19 DIAGNOSIS — K652 Spontaneous bacterial peritonitis: Secondary | ICD-10-CM | POA: Diagnosis not present

## 2023-07-19 DIAGNOSIS — K860 Alcohol-induced chronic pancreatitis: Secondary | ICD-10-CM | POA: Diagnosis not present

## 2023-07-19 DIAGNOSIS — R140 Abdominal distension (gaseous): Secondary | ICD-10-CM | POA: Diagnosis not present

## 2023-07-19 DIAGNOSIS — K852 Alcohol induced acute pancreatitis without necrosis or infection: Secondary | ICD-10-CM | POA: Diagnosis not present

## 2023-07-19 DIAGNOSIS — K8521 Alcohol induced acute pancreatitis with uninfected necrosis: Secondary | ICD-10-CM | POA: Diagnosis not present

## 2023-07-19 DIAGNOSIS — R188 Other ascites: Secondary | ICD-10-CM

## 2023-07-19 DIAGNOSIS — F1020 Alcohol dependence, uncomplicated: Secondary | ICD-10-CM | POA: Diagnosis not present

## 2023-07-19 DIAGNOSIS — F329 Major depressive disorder, single episode, unspecified: Secondary | ICD-10-CM | POA: Insufficient documentation

## 2023-07-19 LAB — CBC
HCT: 43.3 % (ref 39.0–52.0)
Hemoglobin: 14.4 g/dL (ref 13.0–17.0)
MCH: 28.8 pg (ref 26.0–34.0)
MCHC: 33.3 g/dL (ref 30.0–36.0)
MCV: 86.6 fL (ref 80.0–100.0)
Platelets: 345 10*3/uL (ref 150–400)
RBC: 5 MIL/uL (ref 4.22–5.81)
RDW: 14.5 % (ref 11.5–15.5)
WBC: 18.7 10*3/uL — ABNORMAL HIGH (ref 4.0–10.5)
nRBC: 0 % (ref 0.0–0.2)

## 2023-07-19 LAB — BODY FLUID CELL COUNT WITH DIFFERENTIAL: Total Nucleated Cell Count, Fluid: UNDETERMINED uL

## 2023-07-19 LAB — BASIC METABOLIC PANEL
Anion gap: 12 (ref 5–15)
BUN: 24 mg/dL — ABNORMAL HIGH (ref 6–20)
CO2: 23 mmol/L (ref 22–32)
Calcium: <4 mg/dL — CL (ref 8.9–10.3)
Chloride: 95 mmol/L — ABNORMAL LOW (ref 98–111)
Creatinine, Ser: 1.19 mg/dL (ref 0.61–1.24)
GFR, Estimated: >60 mL/min (ref >60)
Glucose, Bld: 117 mg/dL — ABNORMAL HIGH (ref 70–99)
Potassium: 4.7 mmol/L (ref 3.5–5.1)
Sodium: 130 mmol/L — ABNORMAL LOW (ref 135–145)

## 2023-07-19 LAB — AMYLASE, PLEURAL OR PERITONEAL FLUID: Amylase, Fluid: 1793 U/L

## 2023-07-19 LAB — MAGNESIUM: Magnesium: 2.2 mg/dL (ref 1.7–2.4)

## 2023-07-19 LAB — GLUCOSE, CAPILLARY: Glucose-Capillary: 113 mg/dL — ABNORMAL HIGH (ref 70–99)

## 2023-07-19 LAB — LACTIC ACID, PLASMA: Lactic Acid, Venous: 2.1 mmol/L (ref 0.5–1.9)

## 2023-07-19 LAB — PHOSPHORUS: Phosphorus: 3.6 mg/dL (ref 2.5–4.6)

## 2023-07-19 LAB — ALBUMIN: Albumin: 1.7 g/dL — ABNORMAL LOW (ref 3.5–5.0)

## 2023-07-19 MED ORDER — SODIUM CHLORIDE 0.9 % IV SOLN: 4.0000 g | Freq: Once | INTRAVENOUS | Status: DC

## 2023-07-19 MED ORDER — HYDROMORPHONE HCL 1 MG/ML IJ SOLN
1.0000 mg | Freq: Once | INTRAMUSCULAR | Status: AC
  Administered 2023-07-19: 1 mg via INTRAVENOUS
  Filled 2023-07-19: qty 1

## 2023-07-19 MED ORDER — CITALOPRAM HYDROBROMIDE 20 MG PO TABS
20.0000 mg | ORAL_TABLET | Freq: Every day | ORAL | Status: DC
  Administered 2023-07-19 – 2023-07-28 (×10): 20 mg via ORAL
  Filled 2023-07-19 (×10): qty 1

## 2023-07-19 MED ORDER — LIDOCAINE HCL (PF) 1 % IJ SOLN
10.0000 mL | Freq: Once | INTRAMUSCULAR | Status: AC
  Administered 2023-07-19: 10 mL via SUBCUTANEOUS
  Filled 2023-07-19: qty 10

## 2023-07-19 MED ORDER — ALBUMIN HUMAN 25 % IV SOLN
25.0000 g | Freq: Once | INTRAVENOUS | Status: AC
  Administered 2023-07-19: 25 g via INTRAVENOUS
  Filled 2023-07-19: qty 100

## 2023-07-19 MED ORDER — CALCIUM GLUCONATE-NACL 2-0.675 GM/100ML-% IV SOLN
2.0000 g | Freq: Once | INTRAVENOUS | Status: AC
  Administered 2023-07-19: 2000 mg via INTRAVENOUS
  Filled 2023-07-19: qty 100

## 2023-07-19 MED ORDER — CYANOCOBALAMIN 1000 MCG/ML IJ SOLN
1000.0000 ug | Freq: Every day | INTRAMUSCULAR | Status: AC
  Administered 2023-07-20 – 2023-07-21 (×2): 1000 ug via INTRAMUSCULAR
  Filled 2023-07-19 (×3): qty 1

## 2023-07-19 MED ORDER — SODIUM CHLORIDE 0.9 % IV SOLN
2.0000 g | INTRAVENOUS | Status: DC
  Administered 2023-07-20 – 2023-07-22 (×3): 2 g via INTRAVENOUS
  Filled 2023-07-19 (×4): qty 20

## 2023-07-19 MED ORDER — DEXTROSE-SODIUM CHLORIDE 5-0.9 % IV SOLN: INTRAVENOUS | Status: DC

## 2023-07-19 MED ORDER — ACETAMINOPHEN 10 MG/ML IV SOLN
1000.0000 mg | Freq: Four times a day (QID) | INTRAVENOUS | Status: AC
  Administered 2023-07-19 – 2023-07-20 (×4): 1000 mg via INTRAVENOUS
  Filled 2023-07-19 (×4): qty 100

## 2023-07-19 NOTE — Progress Notes (Addendum)
       CROSS COVER NOTE  NAME: Ralphie Ostling Sr. MRN: [id number] DOB : Aug 17, 1986    Concern as stated by nurse / staff   Critical calcium of 5.3 paged   Pertinent findings on chart review:    07/19/2023    4:22 AM 07/18/2023   11:13 PM 07/18/2023    7:49 PM  Vitals with BMI  Systolic 117 117 413  Diastolic 89 84 78  Pulse  119 244       Latest Reference Range & Units 07/19/23 04:52  Sodium 135 - 145 mmol/L 130 (L)  Potassium 3.5 - 5.1 mmol/L 4.7  Chloride 98 - 111 mmol/L 95 (L)  CO2 22 - 32 mmol/L 23  Glucose 70 - 99 mg/dL 010 (H)  BUN 6 - 20 mg/dL 24 (H)  Creatinine 2.72 - 1.24 mg/dL 5.36  Calcium 8.9 - 64.4 mg/dL <0.3 (LL)  Anion gap 5 - 15  12  Phosphorus 2.5 - 4.6 mg/dL 3.6  Magnesium 1.7 - 2.4 mg/dL 2.2  GFR, Estimated >47 mL/min >60  Lactic Acid, Venous 0.5 - 1.9 mmol/L 2.1 (HH)  (LL): Data is critically low (HH): Data is critically high (L): Data is abnormally low (H): Data is abnormally high  Assessment and  Interventions   Assessment: Metabolic acidosis almost completely resolved.  Severe hypoalbuminemia but calcium yesterday nearly same with albumin 1.9 making corrected calcium 5.8. AKI resolved Having paracentesis this am - albumin to be ordered by day  team prior to procedure Plan: 4 gm calcium gluconate Stop bicarb fluids - different IVF therapy not needed at this time Continue to monitor on tele Albumin level added to blood in lab - lab informed       Donnie Mesa NP [facility] Cross Cover 7pm-7am - check amion for availability Pager [phone]

## 2023-07-19 NOTE — Plan of Care (Signed)

## 2023-07-19 NOTE — Consult Note (Signed)
Patient ID: Shane Sergeant Sr., male   DOB: September 18, 1986, 37 y.o.   MRN: [id number]  HPI Shane Elza Sr. is a 37 y.o. 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  Patient was recently admitted to [facility] from mid August to end of August secondary to worsening of abdominal pain from pancreatitis and pancreatic ascites.   Presented to ER again yesterday due to worsening of abdominal distention resulting in severe abdominal pain, nausea and vomiting.  He reports that the pain was severe dull diffuse worsening with certain movements.  He underwent CT A/P which I have pers reviewed revealing  stable changes of chronic pancreatitis, tiny pseudocyst, multiple multiloculated fluid collections throughout the abdomen, moderate to large amount of ascites showing mild peritoneal enhancement.  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  Last hospitalization he did okay with a trial and n.p.o. TPN octreotide and some antibiotics. HPI  Past Medical History:  Diagnosis Date   Alcohol use disorder, severe, dependence (HCC)     No past surgical history on file.  Family History  Problem Relation Age of Onset   Alcohol abuse Mother     Social History Social History   Tobacco Use   Smoking status: Every Day   Smokeless tobacco: Never  Substance Use Topics   Alcohol use: Not Currently    Comment: none since June 2021   Drug use: Not Currently    Allergies  Allergen Reactions   Oxycodone Rash    Rash/itching which required benadryl to resolve   Sudafed [Pseudoephedrine]     History per pt of this being linked with his seizures    Current Facility-Administered Medications  Medication Dose Route Frequency Provider Last Rate Last Admin   acetaminophen  (OFIRMEV) IV 1,000 mg  1,000 mg Intravenous Q6H Amron Guerrette F, MD 400 mL/hr at 07/19/23 1759 1,000 mg at 07/19/23 1759   [START ON 07/20/2023] cefTRIAXone (ROCEPHIN) 2 g in sodium chloride 0.9 % 100 mL IVPB  2 g Intravenous Q24H Marrion Coy, MD       citalopram (CELEXA) tablet 20 mg  20 mg Oral Daily Marrion Coy, MD   20 mg at 07/19/23 1733   cyanocobalamin (VITAMIN B12) injection 1,000 mcg  1,000 mcg Intramuscular Daily Marrion Coy, MD       enoxaparin (LOVENOX) injection 40 mg  40 mg Subcutaneous Q24H Earlie Lou L, MD   40 mg at 07/18/23 2104   HYDROmorphone (DILAUDID) injection 1 mg  1 mg Intravenous Q2H PRN Marrion Coy, MD   1 mg at 07/19/23 2045   lipase/protease/amylase (CREON) capsule 72,000 Units  72,000 Units Oral TID WC Marrion Coy, MD       ondansetron (ZOFRAN) tablet 4 mg  4 mg Oral Q6H PRN Rometta Emery, MD       Or   ondansetron (ZOFRAN) injection 4 mg  4 mg Intravenous Q6H PRN Earlie Lou L, MD   4 mg at 07/18/23 2320   pantoprazole (PROTONIX) injection 40 mg  40 mg Intravenous Q24H Manuela Schwartz, NP   40 mg at 07/19/23 0518   polyethylene glycol (MIRALAX / GLYCOLAX) packet 17 g  17 g Oral Daily Toney Reil, MD   17 g at 07/19/23 1254     Review of Systems Full ROS  was asked and was negative  except for the information on the HPI  Physical Exam Blood pressure (!) 129/90, pulse 95, temperature 97.8 F (36.6 C), temperature source Oral, resp. rate 19, height 6\' 1"  (1.854 m), weight 65.8 kg, SpO2 99%. CONSTITUTIONAL: malnourished. EYES: Pupils are equal, round, and reactive to light, Sclera are non-icteric. EARS, NOSE, MOUTH AND THROAT: The oropharynx is clear. The oral mucosa is pink and moist. Hearing is intact to voice. LYMPH NODES:  Lymph nodes in the neck are normal. RESPIRATORY:  Lungs are clear. There is normal respiratory effort, with equal breath sounds bilaterally, and without pathologic use of accessory muscles. CARDIOVASCULAR: Heart is  regular without murmurs, gallops, or rubs. GI: The abdomen is  soft, mild diffuse tenderness w/o peritonitis, some ascites, . There are no palpable masses. There is no hepatosplenomegaly. There are decreased bowel sounds in all quadrants. GU: Rectal deferred.   MUSCULOSKELETAL: Normal muscle strength and tone. No cyanosis or edema.   SKIN: Turgor is good and there are no pathologic skin lesions or ulcers. NEUROLOGIC: Motor and sensation is grossly normal. Cranial nerves are grossly intact. PSYCH:  Oriented to person, place and time. Affect is normal.  Data Reviewed  I have personally reviewed the patient's imaging, laboratory findings and medical records.    Assessment/Plan 37 year old male with chronic alcoholic pancreatitis now with pancreatic ascites that is severe and has failed prior medical management.  My recommendation is to proceed with advanced endoscopy for possible pancreatic stent.  If pancreatic stents fails he may require distal pancreatectomy or other type of pancreatic drainage procedure depending on where the disruption of the pancreatic duct is.  We do not do any pancreatic surgery or any advanced endoscopy here at [facility] and hospitalist advised about the need to transfer the patient.  From a general surgery perspective I do not have much to offer.  There is no need for emergent surgical intervention at this time.  Please note that I spent 75 minutes including personally reviewing the images, placing order, counseling the patient and the family about his disease process and performing documentation. Agree w paracentesis, octrotide in the meantime.  Sterling Big, MD FACS General Surgeon 07/19/2023, 9:06 PM

## 2023-07-19 NOTE — Progress Notes (Signed)
Progress Note   Patient: Shane Colla Sr. MWU:[id number] DOB: 04-Jun-1986 DOA: 07/17/2023     2 DOS: the patient was seen and examined on 07/19/2023   Brief hospital course: Shane Titman Sr. is a 37 y.o. male with medical history significant of prior history of alcoholism who actually quit a few months ago, tobacco abuse, prior history of pancreatitis who presents to the ER with abdominal pain and nausea.  Upon arriving the emergency room, her heart rate was 147, respiratory rate was 34, lab study significant leukocytosis of 21, lactic acid 3.7.  Patient also has significant abdominal tenderness with rebound tenderness.  CT abdomen/pelvis showed moderate to large ascites with peritoneal enhancement.  Small pancreatic pseudocyst. Patient was started on IV fluids, pain medicine.  Due to concern for peritonitis, patient was started on Rocephin. Consult from General surgery and GI are obtained, paracentesis performed 9/8.      Principal Problem:   Alcohol induced acute pancreatitis Active Problems:   Severe malnutrition (HCC)   Sinus tachycardia   Pancreatic cyst   Alcohol use disorder, moderate, dependence (HCC)   Hyponatremia   Tobacco abuse   AKI (acute kidney injury) (HCC)   Protein-calorie malnutrition, severe   Pancreatitis   Hypomagnesemia   Spontaneous bacterial peritonitis (HCC)   Severe sepsis (HCC)   Reactive thrombocytosis   Normal anion gap metabolic acidosis   Hypocalcemia   Pancreatic ascites   Assessment and Plan: Severe sepsis. Spontaneous bacterial peritonitis. Patient met sepsis criteria with significant tachycardia, tachypnea and severe leukocytosis.  Patient also has organ failure with acute kidney injury, lactic acidosis. Based on my examination, patient had a significant peritonitis, evidenced by significant tenderness and rebound tenderness.  I personally reviewed patient's CT scan images, there is no evidence of organ perforation.  Due to significant rebound  tenderness, I repeated stat KUB/chest x-ray, still did not show any evidence of organ perforation.   Patient is started on Rocephin, blood culture so far negative I will continue Rocephin for at least 7 days. Paracentesis is performed today with removing of 5.6 L of ascites, patient also giving albumin after. Following culture results from ascites.   Alcoholic acute pancreatitis with chronic pancreatitis.   Small pseudocyst. Pancreatic ascites. Patient had increased lipase, consistent with pancreatitis.  Patient also had history of chronic diarrhea, will continue Creon. Patient has been seen by general surgery and GI, condition most likely due to pancreatic ascites with disruption of pancreatic duct.  I have been contacted with home health ERCP physician, unfortunately, they cannot take patient due to complexity. I also contact the [facility], spoke with Dr. Arty Baumgartner, who will review imaging studies performed since August, and discussed with her colleagues and, with the solution. Currently, her recommendation is started diet with clear, advance as tolerated.  Patient also has a scheduled appointment with them on 9/18.  Major depression. Patient has a significant depressed mood, will start Celexa.   Acute kidney injury. Hyponatremia. Metabolic acidosis. Hypomagnesemia. Hypocalcemia. Condition improving after giving fluids.  Reduce IV fluids.   Severe protein calorie malnutrition. Start protein supplements.   Reactive thrombocytosis. Secondary to sepsis, continue to follow.   Uncontrolled type 2 diabetes with hyperglycemia  Patient on sliding scale insulin.  Patient will be placed on liquid diet.        Subjective:  Patient still has significant pain in the stomach, but seem to be better today after the paracentesis.  No nausea vomiting.  Physical Exam: Vitals:   07/18/23 2313 07/19/23  0422 07/19/23 0857 07/19/23 1238  BP: 117/84 117/89 (!) 121/93 (!) 140/87  Pulse: (!) 119  (!) 120 99 (!) 114  Resp: (!) 21   16  Temp: 97.7 F (36.5 C) (!) 97.5 F (36.4 C)  98 F (36.7 C)  TempSrc: Oral Axillary  Oral  SpO2: 98% 100% 100% 95%  Weight:      Height:       General exam: Appears calm and comfortable  Respiratory system: Clear to auscultation. Respiratory effort normal. Cardiovascular system: S1 & S2 heard, RRR. No JVD, murmurs, rubs, gallops or clicks. No pedal edema. Gastrointestinal system: Abdomen is nondistended, soft and diffusely tender. No organomegaly or masses felt. Normal bowel sounds heard. Central nervous system: Alert and oriented. No focal neurological deficits. Extremities: Symmetric 5 x 5 power. Skin: No rashes, lesions or ulcers Psychiatry: Judgement and insight appear normal. Mood & affect appropriate.    Data Reviewed:  Lab results reviewed.  Family Communication: Significant other at the bedside.  Disposition: Status is: Inpatient Remains inpatient appropriate because: Severity of disease, IV treatment.     Time spent: 55 minutes  Author: Marrion Coy, MD 07/19/2023 1:17 PM  For on call review [email or web address].

## 2023-07-19 NOTE — Progress Notes (Signed)
Shane Repress, MD [address]  [address]  [place], [place] [address]  Main: [phone]  Fax: [phone] Pager: [phone]   Subjective: Patient underwent paracentesis today, 5.8 L of hazy yellowish-white fluid was removed.  Patient reports that his abdomen is softer and comfortable.  His wife is bedside.  Objective: Vital signs in last 24 hours: Vitals:   07/18/23 2313 07/19/23 0422 07/19/23 0857 07/19/23 1238  BP: 117/84 117/89 (!) 121/93 (!) 140/87  Pulse: (!) 119 (!) 120 99 (!) 114  Resp: (!) 21   16  Temp: 97.7 F (36.5 C) (!) 97.5 F (36.4 C)  98 F (36.7 C)  TempSrc: Oral Axillary  Oral  SpO2: 98% 100% 100% 95%  Weight:      Height:       Weight change:   Intake/Output Summary (Last 24 hours) at 07/19/2023 1520 Last data filed at 07/19/2023 1100 Gross per 24 hour  Intake 1824.44 ml  Output 350 ml  Net 1474.44 ml     Exam: Heart: Tachycardia Lungs: clear to auscultation Abdomen: Soft, mildly distended, mild diffuse tenderness   Lab Results:    Latest Ref Rng & Units 07/19/2023    4:52 AM 07/18/2023    6:49 AM 07/17/2023    2:08 PM  CBC  WBC 4.0 - 10.5 K/uL 18.7  25.4  21.0   Hemoglobin 13.0 - 17.0 g/dL 64.4  03.4  74.2   Hematocrit 39.0 - 52.0 % 43.3  53.1  46.5   Platelets 150 - 400 K/uL 345  468  474       Latest Ref Rng & Units 07/19/2023    4:52 AM 07/18/2023    6:49 AM 07/17/2023    2:08 PM  CMP  Glucose 70 - 99 mg/dL 595  638  756   BUN 6 - 20 mg/dL 24  15  8    Creatinine 0.61 - 1.24 mg/dL 4.33  2.95  1.88   Sodium 135 - 145 mmol/L 130  134  134   Potassium 3.5 - 5.1 mmol/L 4.7  5.0  4.2   Chloride 98 - 111 mmol/L 95  105  103   CO2 22 - 32 mmol/L 23  16  17    Calcium 8.9 - 10.3 mg/dL <4.1  4.1  6.7   Total Protein 6.5 - 8.1 g/dL  4.8  6.2   Total Bilirubin 0.3 - 1.2 mg/dL  0.3  0.5   Alkaline Phos 38 - 126 U/L  50  60   AST 15 - 41 U/L  24  14   ALT 0 - 44 U/L  8  11     Micro Results: Recent Results (from the past 240  hour(s))  Culture, blood (Routine X 2) w Reflex to ID Panel     Status: None (Preliminary result)   Collection Time: 07/18/23  1:38 AM   Specimen: BLOOD  Result Value Ref Range Status   Specimen Description BLOOD BLOOD LEFT ARM  Final   Special Requests   Final    BOTTLES DRAWN AEROBIC ONLY Blood Culture results may not be optimal due to an inadequate volume of blood received in culture bottles   Culture   Final    NO GROWTH 1 DAY Performed at [facility], [address] [place], [place] [address]    Report Status PENDING  Incomplete  Culture, blood (Routine X 2) w Reflex to ID Panel     Status: None (Preliminary result)  Collection Time: 07/18/23  1:38 AM   Specimen: BLOOD  Result Value Ref Range Status   Specimen Description BLOOD BLOOD LEFT HAND  Final   Special Requests   Final    BOTTLES DRAWN AEROBIC AND ANAEROBIC Blood Culture adequate volume   Culture   Final    NO GROWTH 1 DAY Performed at [facility], [address], [place], [place] [address]    Report Status PENDING  Incomplete   Studies/Results: US Paracentesis  Result Date: 07/19/2023 INDICATION: History of alcoholic cirrhosis and pancreatitis. Concern for peritonitis today. Request received for diagnostic and therapeutic paracentesis. EXAM: ULTRASOUND GUIDED PARACENTESIS MEDICATIONS: 8 cc 1% lidocaine COMPLICATIONS: None immediate. PROCEDURE: Informed written consent was obtained from the patient after a discussion of the risks, benefits and alternatives to treatment. A timeout was performed prior to the initiation of the procedure. Initial ultrasound scanning demonstrates a large amount of ascites within the right lower abdominal quadrant. The right lower abdomen was prepped and draped in the usual sterile fashion. 1% lidocaine was used for local anesthesia. Following this, a 19 gauge, 7-cm, Yueh catheter was introduced. An ultrasound image was saved for documentation purposes. The paracentesis was  performed. The catheter was removed and a dressing was applied. The patient tolerated the procedure well without immediate post procedural complication. Patient received post-procedure intravenous albumin; see nursing notes for details. FINDINGS: A total of approximately 5.8 L of hazy, yellow-white fluid was removed. Samples were sent to the laboratory as requested by the clinical team. IMPRESSION: Successful ultrasound-guided paracentesis yielding 5.8 liters of peritoneal fluid. PLAN: The patient has previously been formally evaluated by the [facility] and is being actively followed for potential future intervention. Procedure performed by Mina Marble, PA-C Electronically Signed   By: Simonne Come M.D.   On: 07/19/2023 14:47   DG ABD ACUTE 2+V W 1V CHEST  Result Date: 07/18/2023 CLINICAL DATA:  Abdominal pain and nausea. EXAM: DG ABDOMEN ACUTE WITH 1 VIEW CHEST COMPARISON:  Chest radiograph on 01/01/2021 FINDINGS: There is no evidence of dilated bowel loops or free intraperitoneal air. No radiopaque calculi or other significant radiographic abnormality is seen. Contrast seen in urinary bladder from recent CT. Heart size and mediastinal contours are within normal limits. Mild scarring again noted in left lung base. No No evidence of acute infiltrate or pleural effusion. IMPRESSION: Unremarkable bowel gas pattern. Mild left basilar scarring.  No active cardiopulmonary disease. Electronically Signed   By: Danae Orleans M.D.   On: 07/18/2023 10:28   CT ABDOMEN PELVIS W CONTRAST  Result Date: 07/17/2023 CLINICAL DATA:  Severe abdominal pain beginning this morning. Nausea and vomiting diarrhea. Tachycardia. Pancreatitis. EXAM: CT ABDOMEN AND PELVIS WITH CONTRAST TECHNIQUE: Multidetector CT imaging of the abdomen and pelvis was performed using the standard protocol following bolus administration of intravenous contrast. RADIATION DOSE REDUCTION: This exam was  performed according to the departmental dose-optimization program which includes automated exposure control, adjustment of the mA and/or kV according to patient size and/or use of iterative reconstruction technique. CONTRAST:  OMNIPAQUE IOHEXOL 300 MG/ML  SOLN COMPARISON:  06/23/2023 FINDINGS: Lower Chest: No acute findings. Hepatobiliary: No suspicious hepatic masses identified. Gallbladder is unremarkable. No evidence of biliary ductal dilatation. Pancreas: Mild dilatation and irregularity of the pancreatic duct is again seen, consistent with chronic pancreatitis. No pancreatic calcifications noted. Few small cystic lesions are again seen in the uncinate process and body, largest measuring 12 mm in the uncinate process, which is  mildly decreased from 17 mm on previous study. These are consistent with small pseudocysts. No pancreatic masses or acute peripancreatic inflammatory changes are seen. Multiple multiloculated fluid collections are again seen throughout the abdomen which show thin peripheral rim enhancement. Moderate large amount of ascites is again seen in the abdomen and pelvis showing mild peritoneal enhancement. These findings show no significant change in consistent with pseudocysts. Spleen: Within normal limits in size and appearance. Adrenals/Urinary Tract: No suspicious masses identified. No evidence of ureteral calculi or hydronephrosis. Stomach/Bowel: New small hiatal hernia noted. No evidence of bowel obstruction. Mild small bowel ileus is again noted with mild diffuse small bowel wall thickening, consistent with enteritis. Vascular/Lymphatic: No pathologically enlarged lymph nodes. No acute vascular findings. Chronic splenic vein thrombosis is again seen with numerous venous collaterals in the gastrosplenic ligament. Reproductive:  No mass or other significant abnormality. Other:  None. Musculoskeletal:  No suspicious bone lesions identified. IMPRESSION: Stable changes of chronic  pancreatitis, with probable tiny pseudocysts in the uncinate process and body. No radiographic signs of acute pancreatitis. Multiple multiloculated fluid collections throughout the abdomen, and moderate to large amount of ascites showing mild peritoneal enhancement. These findings are consistent with pancreatic pseudocysts. No significant change in mild small bowel ileus, and diffuse small bowel wall enteritis which may be reactive. No evidence of bowel obstruction. Chronic splenic vein thrombosis, with numerous venous collaterals in the gastrosplenic ligament. New small hiatal hernia. Electronically Signed   By: Danae Orleans M.D.   On: 07/17/2023 17:42   Medications: I have reviewed the patient's current medications. Prior to Admission:  Medications Prior to Admission  Medication Sig Dispense Refill Last Dose   cyanocobalamin 1000 MCG tablet Take 1 tablet (1,000 mcg total) by mouth daily. 30 tablet 0 07/16/2023   ergocalciferol (VITAMIN D2) 1.25 MG (50000 UT) capsule Take 50,000 Units by mouth once a week.   Past Week   lipase/protease/amylase (CREON) 36000 UNITS CPEP capsule Take 2 capsules (72,000 Units total) by mouth 3 (three) times daily with meals. 180 capsule 0 07/16/2023   pantoprazole (PROTONIX) 40 MG tablet Take 1 tablet (40 mg total) by mouth daily. 30 tablet 0 07/16/2023   Scheduled:  citalopram  20 mg Oral Daily   cyanocobalamin  1,000 mcg Intramuscular Daily   enoxaparin (LOVENOX) injection  40 mg Subcutaneous Q24H   feeding supplement (GLUCERNA SHAKE)  237 mL Oral BID BM   insulin aspart  0-9 Units Subcutaneous Q4H   lipase/protease/amylase  72,000 Units Oral TID WC   pantoprazole (PROTONIX) IV  40 mg Intravenous Q24H   polyethylene glycol  17 g Oral Daily   Continuous:  acetaminophen 1,000 mg (07/19/23 1258)   [START ON 07/20/2023] cefTRIAXone (ROCEPHIN)  IV     dextrose 5 % and 0.9 % NaCl 50 mL/hr at 07/19/23 1452   WUJ:[id number] (DILAUDID) injection, ondansetron **OR**  ondansetron (ZOFRAN) IV Anti-infectives (From admission, onward)    Start     Dose/Rate Route Frequency Ordered Stop   07/20/23 1000  cefTRIAXone (ROCEPHIN) 2 g in sodium chloride 0.9 % 100 mL IVPB        2 g 200 mL/hr over 30 Minutes Intravenous Every 24 hours 07/19/23 1046     07/18/23 0130  cefTRIAXone (ROCEPHIN) 2 g in sodium chloride 0.9 % 100 mL IVPB  Status:  Discontinued        2 g 200 mL/hr over 30 Minutes Intravenous Every 12 hours 07/18/23 0037 07/19/23 1046      Scheduled Meds:  citalopram  20 mg Oral Daily   cyanocobalamin  1,000 mcg Intramuscular Daily   enoxaparin (LOVENOX) injection  40 mg Subcutaneous Q24H   feeding supplement (GLUCERNA SHAKE)  237 mL Oral BID BM   insulin aspart  0-9 Units Subcutaneous Q4H   lipase/protease/amylase  72,000 Units Oral TID WC   pantoprazole (PROTONIX) IV  40 mg Intravenous Q24H   polyethylene glycol  17 g Oral Daily   Continuous Infusions:  acetaminophen 1,000 mg (07/19/23 1258)   [START ON 07/20/2023] cefTRIAXone (ROCEPHIN)  IV     dextrose 5 % and 0.9 % NaCl 50 mL/hr at 07/19/23 1452   PRN Meds:.HYDROmorphone (DILAUDID) injection, ondansetron **OR** ondansetron (ZOFRAN) IV   Assessment: Principal Problem:   Alcohol induced acute pancreatitis Active Problems:   Hyponatremia   AKI (acute kidney injury) (HCC)   Severe malnutrition (HCC)   Sinus tachycardia   Protein-calorie malnutrition, severe   Pancreatic cyst   Alcohol use disorder, moderate, dependence (HCC)   Tobacco abuse   Pancreatitis   Hypomagnesemia   Spontaneous bacterial peritonitis (HCC)   Severe sepsis (HCC)   Reactive thrombocytosis   Normal anion gap metabolic acidosis   Hypocalcemia   Pancreatic ascites   Major depression  Shane Fabbri Sr. is a 37 y.o. male with alcoholic pancreatitis resulting in chronic pancreatitis, recurrent pancreatic ascites readmitted with worsening of abdominal distention, recurrence of pancreatic ascites   Plan: Abdominal  distention with recurrence of pancreatic ascites Patient had paracentesis in the past which revealed significantly elevated amylase and lipase levels and ascitic fluid consistent with pancreatic ascites which is concerning for pancreatic duct disruption Status post therapeutic paracentesis on 9/8, fluid analysis was performed, however cell count with differential could not be reported because of the nature of specimen.  Patient is empirically treated for secondary bacterial peritonitis.  Fluid amylase is elevated.  Fluid lipase is pending.  If there is lipase in ascitic fluid, recommend to start octreotide 200 mg twice daily and titrate upwards Okay to continue full liquid diet and protein supplements Patient has an appointment with [facility] advanced endoscopy fellow on 9/18 to discuss about management of pancreatic ascites, and possible PD stent placement Discussed with patient regarding putting him back on TPN versus enteral nutrition which may help reduce reaccumulation of ascitic fluid.  Patient is hesitant to start TPN Dr. Chipper Herb contacted [facility] who will review the case before inpatient transfer Continue pancreatic enzyme replacement therapy  Hypocalcemia Corrected calcium is 5.8, patient has received albumin and calcium gluconate Monitor calcium levels closely  Dr. Tobi Bastos will cover from tomorrow    LOS: 2 days   Haedyn Breau 07/19/2023, 3:20 PM

## 2023-07-19 NOTE — Procedures (Signed)
PROCEDURE SUMMARY:  Successful image-guided paracentesis from the right lower abdomen.  Yielded 5.8 liters of hazy, yellow-white fluid.  No immediate complications.  EBL = trace. Patient tolerated well.   Specimen was sent for labs.  Please see imaging section of Epic for full dictation.   Kennieth Francois PA-C 07/19/2023 9:02 AM

## 2023-07-20 DIAGNOSIS — A419 Sepsis, unspecified organism: Secondary | ICD-10-CM

## 2023-07-20 DIAGNOSIS — K652 Spontaneous bacterial peritonitis: Secondary | ICD-10-CM

## 2023-07-20 DIAGNOSIS — R188 Other ascites: Secondary | ICD-10-CM | POA: Diagnosis not present

## 2023-07-20 DIAGNOSIS — R6520 Severe sepsis without septic shock: Secondary | ICD-10-CM

## 2023-07-20 LAB — GLUCOSE, CAPILLARY
Glucose-Capillary: 105 mg/dL — ABNORMAL HIGH (ref 70–99)
Glucose-Capillary: 102 mg/dL — ABNORMAL HIGH (ref 70–99)

## 2023-07-20 LAB — PATHOLOGIST SMEAR REVIEW

## 2023-07-20 MED ORDER — OCTREOTIDE ACETATE 100 MCG/ML IJ SOLN
200.0000 ug | Freq: Two times a day (BID) | INTRAMUSCULAR | Status: DC
  Administered 2023-07-20 – 2023-07-28 (×16): 200 ug via SUBCUTANEOUS
  Filled 2023-07-20 (×17): qty 2

## 2023-07-20 MED ORDER — INSULIN ASPART 100 UNIT/ML IJ SOLN: 0.0000 [IU] | Freq: Three times a day (TID) | INTRAMUSCULAR | Status: DC

## 2023-07-20 MED ORDER — SENNOSIDES-DOCUSATE SODIUM 8.6-50 MG PO TABS
2.0000 | ORAL_TABLET | Freq: Two times a day (BID) | ORAL | Status: DC
  Administered 2023-07-20 – 2023-07-21 (×4): 2 via ORAL
  Filled 2023-07-20 (×7): qty 2

## 2023-07-20 MED ORDER — BOOST / RESOURCE BREEZE PO LIQD CUSTOM
1.0000 | Freq: Three times a day (TID) | ORAL | Status: DC
  Administered 2023-07-25 – 2023-07-27 (×6): 1 via ORAL

## 2023-07-20 MED ORDER — OCTREOTIDE LOAD VIA INFUSION: 200.0000 ug | Freq: Two times a day (BID) | INTRAVENOUS | Status: DC

## 2023-07-20 MED ORDER — ADULT MULTIVITAMIN W/MINERALS CH
1.0000 | ORAL_TABLET | Freq: Every day | ORAL | Status: DC
  Administered 2023-07-20 – 2023-07-28 (×8): 1 via ORAL
  Filled 2023-07-20 (×8): qty 1

## 2023-07-20 MED ORDER — HYDROMORPHONE HCL 1 MG/ML IJ SOLN
1.0000 mg | INTRAMUSCULAR | Status: DC | PRN
  Administered 2023-07-20 – 2023-07-27 (×33): 1 mg via INTRAVENOUS
  Filled 2023-07-20 (×35): qty 1

## 2023-07-20 MED ORDER — ALBUMIN HUMAN 25 % IV SOLN
50.0000 g | Freq: Once | INTRAVENOUS | Status: AC
  Administered 2023-07-20: 50 g via INTRAVENOUS
  Filled 2023-07-20: qty 200

## 2023-07-20 MED ORDER — PROSOURCE PLUS PO LIQD
30.0000 mL | Freq: Three times a day (TID) | ORAL | Status: DC
  Administered 2023-07-25 – 2023-07-26 (×5): 30 mL via ORAL
  Filled 2023-07-20: qty 30

## 2023-07-20 MED ORDER — CHLORHEXIDINE GLUCONATE CLOTH 2 % EX PADS
6.0000 | MEDICATED_PAD | Freq: Every day | CUTANEOUS | Status: DC
  Administered 2023-07-20 – 2023-07-26 (×7): 6 via TOPICAL

## 2023-07-20 NOTE — Plan of Care (Signed)

## 2023-07-20 NOTE — Progress Notes (Addendum)
Initial Nutrition Assessment  DOCUMENTATION CODES:   Non-severe (moderate) malnutrition in context of chronic illness  INTERVENTION:   -Boost Breeze po TID, each supplement provides 250 kcal and 9 grams of protein  -30 ml Prosource Plus TID, each supplement provides 100 kcals and 15 grams protein -MVI with minerals daily -Consider initiation of enteral nutrition support (recommend post-pyloric tube placement secondary to pancreatitis):   Initiate Vital 1.5 @ 20 ml/hr and increase by 10 ml every 12 hours to goal rate of 60 ml/hr.   60 ml Prosource TF BID    30 ml free water flush every 4 hours to maintain tube patency  Tube feeding regimen provides 2320 kcal (100% of needs), 137 grams of protein, and 1100 ml of H2O. Total free water: 1280 ml daily   -If feedings are started, recommend monitor Mg K and Phos daily and replete as needed and add 100 mg thiamine daily x 5 days secondary to high refeeding risk  NUTRITION DIAGNOSIS:   Moderate Malnutrition related to chronic illness (alcoholic pancreatitis) as evidenced by mild fat depletion, moderate fat depletion, mild muscle depletion, moderate muscle depletion, energy intake < 75% for > or equal to 1 month.  GOAL:   Patient will meet greater than or equal to 90% of their needs  MONITOR:   PO intake, Supplement acceptance, Diet advancement  REASON FOR ASSESSMENT:   Malnutrition Screening Tool    ASSESSMENT:   Pt with medical history significant of prior history of alcoholism who actually quit a few months ago, tobacco abuse, prior history of pancreatitis who presents with abdominal pain and nausea.  Pt admitted with severe sepsis, spontaneous bacterial peritonitis, and alcoholic pancreatitis with small pseudocyst and pancreatic ascites.   9/8- s/p rt paracentesis (5.8 L removed)  Reviewed I/O's: -10 ml x 24 hours and +3.4 L since admission  UOP: 450 ml x 24 hours   Per MD notes, suspect pancreatic ascites with  disruption of pancreatic duct. MD also awaiting feedback from [facility]. Of note, pt has appointment with [facility] on 07/29/23 to discuss management of pancreatic ascites and possible PD stent placement.   Spoke with pt, who reports not feeling well today and answered mostly close ended questions. Pt wife at bedside; pt deferred most questioning to her. She acknowledges that pt was discharged from the hospital a few weeks ago and intake has declined since then. Per wife, pt was eating, but was generally consuming only one meal per day. Intake has been extremely minimal here. Pt has not had anything today and consumed only a few sips of gatorade yesterday. Pt has not had any significant oral intake since last Thursday (4 days ago).   Pt wife is sure pt has lost weight, but unsure how much. She reports that the waist on his pants were getting tight secondary to ascites, which is likely masking any weight loss. Reviewed wt hx; pt has experienced a 3.2% wt loss over the past 3 months, which is not significant for time frame.   Discussed importance of good meal and supplement intake. Pt reluctantly agreeable to supplements. MD has discussed TF vs TPN with pt. Case discussed with MD and recommended TF (post-pyloric). Pt has been at least 4 days without adequate nutrition and at a high refeeding risk. Noted nutrition-focused physical exam has worsened since hospitalization 3 weeks ago. Per MD, no plans for diet advancement or enteral feeds yet. Pt having procedure on Thursday and can consider diet advancement vs enteral feeds then.  Medications reviewed and include vitamin B-12, creon, and miralax.   Lab Results  Component Value Date   HGBA1C 5.2 07/17/2023   PTA DM medications are none.   Labs reviewed: Na: 130. CBGS: [phone] (inpatient orders for glycemic control are 0-9 units insulin aspart TID with meals).    NUTRITION - FOCUSED PHYSICAL EXAM:  Flowsheet Row Most Recent Value  Orbital Region Moderate  depletion  Upper Arm Region Mild depletion  Thoracic and Lumbar Region Mild depletion  Buccal Region Mild depletion  Temple Region Moderate depletion  Clavicle Bone Region Moderate depletion  Clavicle and Acromion Bone Region Moderate depletion  Scapular Bone Region Moderate depletion  Dorsal Hand Mild depletion  Patellar Region Moderate depletion  Anterior Thigh Region Moderate depletion  Posterior Calf Region Moderate depletion  Edema (RD Assessment) None  Hair Reviewed  Eyes Reviewed  Mouth Reviewed  Skin Reviewed  Nails Reviewed       Diet Order:   Diet Order             Diet clear liquid Fluid consistency: Thin  Diet effective now                   EDUCATION NEEDS:   Education needs have been addressed  Skin:  Skin Assessment: Reviewed RN Assessment  Last BM:  07/15/23  Height:   Ht Readings from Last 1 Encounters:  07/17/23 6\' 1"  (1.854 m)    Weight:   Wt Readings from Last 1 Encounters:  07/17/23 65.8 kg    Ideal Body Weight:  83.6 kg  BMI:  Body mass index is 19.14 kg/m.  Estimated Nutritional Needs:   Kcal:  2300-2500  Protein:  130-145 grams  Fluid:  > 2 L    Levada Schilling, RD, LDN, CDCES Registered Dietitian II Certified Diabetes Care and Education Specialist Please refer to [facility] for RD and/or RD on-call/weekend/after hours pager

## 2023-07-20 NOTE — Progress Notes (Addendum)
Progress Note   Patient: Shane Vandenbosch Sr. AVW:[id number] DOB: [birth year] DOA: 07/17/2023     3 DOS: the patient was seen and examined on 07/20/2023   Brief hospital course: Shane Raimondo Sr. is a 37 y.o. male with medical history significant of prior history of alcoholism who actually quit a few months ago, tobacco abuse, prior history of pancreatitis who presents to the ER with abdominal pain and nausea.  Upon arriving the emergency room, her heart rate was 147, respiratory rate was 34, lab study significant leukocytosis of 21, lactic acid 3.7.  Patient also has significant abdominal tenderness with rebound tenderness.  CT abdomen/pelvis showed moderate to large ascites with peritoneal enhancement.  Small pancreatic pseudocyst. Patient was started on IV fluids, pain medicine.  Due to concern for peritonitis, patient was started on Rocephin. Consult from General surgery and GI are obtained, paracentesis performed 9/8.      Principal Problem:   Alcohol induced acute pancreatitis Active Problems:   Severe malnutrition (HCC)   Sinus tachycardia   Pancreatic cyst   Alcohol use disorder, moderate, dependence (HCC)   Hyponatremia   Tobacco abuse   AKI (acute kidney injury) (HCC)   Protein-calorie malnutrition, severe   Pancreatitis   Hypomagnesemia   Spontaneous bacterial peritonitis (HCC)   Severe sepsis (HCC)   Reactive thrombocytosis   Normal anion gap metabolic acidosis   Hypocalcemia   Pancreatic ascites   Major depression   Assessment and Plan: Severe sepsis. Spontaneous bacterial peritonitis. Patient met sepsis criteria with significant tachycardia, tachypnea and severe leukocytosis.  Patient also has organ failure with acute kidney injury, lactic acidosis. Based on my examination, patient had a significant peritonitis, evidenced by significant tenderness and rebound tenderness.  I personally reviewed patient's CT scan images, there is no evidence of organ perforation.  Due to  significant rebound tenderness, I repeated stat KUB/chest x-ray, still did not show any evidence of organ perforation.   Patient is started on Rocephin, blood culture so far negative I will continue Rocephin for at least 7 days. Paracentesis is performed today with removing of 5.6 L of ascites, patient also giving albumin after. Cell count could not be performed on ascites.  Cultures so far negative. Will continue 5-day dose of antibiotics.   Alcoholic acute pancreatitis with chronic pancreatitis.   Small pseudocyst. Pancreatic ascites. Patient had increased lipase, consistent with pancreatitis.  Patient also had history of chronic diarrhea, will continue Creon. Patient has been seen by general surgery and GI, condition most likely due to pancreatic ascites with disruption of pancreatic duct.  I have been contacted with home health ERCP physician, unfortunately, they cannot take patient due to complexity. I also contact the [facility], spoke with Dr. Arty Baumgartner 9/8, who will review imaging studies performed since August, and discussed with her colleagues and come up with a solution. Currently, her recommendation is started diet with clear, advance as tolerated.  Patient also has a scheduled appointment with them on 9/18. Currently still pending [facility] feedback. Discussed with Dr. Tobi Bastos, will start octreotide 200 mg IV twice a day. Patient also has severe hypoalbuminemia, to prevent rapid recommendation of ascites, I will give 50 g albumin. Ascites appears to be reaccumulating pretty quickly, may need repeat paracentesis in the near future.  Addendum: 1600. Dr. Penny Pia called me back, tentatively scheduled to have the ERCP and pancreatic stent on Thursday. Patient needs to be transferred [facility] GI procedure room (in the basement) by 10am Thursday. Wants to have a  repeated paracentesis on Wednesday.    Major depression. Patient has a significant depressed mood,started Celexa.      Acute kidney injury. Hyponatremia. Metabolic acidosis. Hypomagnesemia. Hypocalcemia. IV fluids discontinued.  Still has some hyponatremia.   Severe protein calorie malnutrition. Start protein supplements.   Reactive thrombocytosis. Secondary to sepsis, continue to follow.   Uncontrolled type 2 diabetes with hyperglycemia  Patient on sliding scale insulin.  Patient is placed on liquid diet.          Subjective:  Patient still has significant pain, but is better since paracentesis. No nausea vomiting.  Physical Exam: Vitals:   07/19/23 2025 07/20/23 0433 07/20/23 0821 07/20/23 1223  BP: (!) 129/90 138/89 (!) 132/93 (!) 140/91  Pulse: 95 90 85 92  Resp: 19 11 16 18   Temp: 97.8 F (36.6 C) 98 F (36.7 C) 97.6 F (36.4 C) 98.5 F (36.9 C)  TempSrc: Oral Oral Oral Oral  SpO2: 99% 97% 94% 98%  Weight:      Height:       General exam: Appears calm and comfortable  Respiratory system: Clear to auscultation. Respiratory effort normal. Cardiovascular system: S1 & S2 heard, RRR. No JVD, murmurs, rubs, gallops or clicks. No pedal edema. Gastrointestinal system: Abdomen is distended, soft and tender. No organomegaly or masses felt. Normal bowel sounds heard. Central nervous system: Alert and oriented. No focal neurological deficits. Extremities: Symmetric 5 x 5 power. Skin: No rashes, lesions or ulcers Psychiatry: Judgement and insight appear normal. Mood & affect appropriate.    Data Reviewed:  Lab results reviewed.  Family Communication: Wife updated at the bedside.  Disposition: Status is: Inpatient Remains inpatient appropriate because: Severity of disease.  IV treatment.     Time spent: 50 minutes  Author: Marrion Coy, MD 07/20/2023 12:50 PM  For on call review [email or web address].

## 2023-07-20 NOTE — Progress Notes (Signed)
Transition of Care [facility]) - Inpatient Brief Assessment   Patient Details  Name: Shane Bilson Sr. MRN: [id number] Date of Birth: [birth year]  [facility] CM/SW Contact:    Truddie Hidden, RN Phone Number: 07/20/2023, 4:09 PM   Clinical Narrative: TOC continuing to follow patient's progress throughout discharge planning.   Transition of Care Asessment: Insurance and Status: Insurance coverage has been reviewed Patient has primary care physician: Yes Home environment has been reviewed: home Prior level of function:: independent Prior/Current Home Services: No current home services Social Determinants of Health Reivew: SDOH reviewed no interventions necessary Readmission risk has been reviewed: Yes Transition of care needs: no transition of care needs at this time

## 2023-07-21 ENCOUNTER — Inpatient Hospital Stay: Payer: Medicaid Other

## 2023-07-21 DIAGNOSIS — R188 Other ascites: Secondary | ICD-10-CM | POA: Diagnosis not present

## 2023-07-21 DIAGNOSIS — K652 Spontaneous bacterial peritonitis: Secondary | ICD-10-CM | POA: Diagnosis not present

## 2023-07-21 DIAGNOSIS — A419 Sepsis, unspecified organism: Secondary | ICD-10-CM | POA: Diagnosis not present

## 2023-07-21 DIAGNOSIS — R6520 Severe sepsis without septic shock: Secondary | ICD-10-CM | POA: Diagnosis not present

## 2023-07-21 LAB — COMPREHENSIVE METABOLIC PANEL
ALT: 6 U/L (ref 0–44)
AST: 11 U/L — ABNORMAL LOW (ref 15–41)
Albumin: 2.3 g/dL — ABNORMAL LOW (ref 3.5–5.0)
Alkaline Phosphatase: 45 U/L (ref 38–126)
Anion gap: 6 (ref 5–15)
BUN: 10 mg/dL (ref 6–20)
CO2: 29 mmol/L (ref 22–32)
Calcium: 7.5 mg/dL — ABNORMAL LOW (ref 8.9–10.3)
Chloride: 96 mmol/L — ABNORMAL LOW (ref 98–111)
Creatinine, Ser: 0.58 mg/dL — ABNORMAL LOW (ref 0.61–1.24)
GFR, Estimated: >60 mL/min (ref >60)
Glucose, Bld: 92 mg/dL (ref 70–99)
Potassium: 4.4 mmol/L (ref 3.5–5.1)
Sodium: 131 mmol/L — ABNORMAL LOW (ref 135–145)
Total Bilirubin: 0.8 mg/dL (ref 0.3–1.2)
Total Protein: 4.6 g/dL — ABNORMAL LOW (ref 6.5–8.1)

## 2023-07-21 LAB — AEROBIC CULTURE W GRAM STAIN (SUPERFICIAL SPECIMEN)
Culture: NO GROWTH
Gram Stain: NONE SEEN

## 2023-07-21 LAB — GLUCOSE, CAPILLARY
Glucose-Capillary: 71 mg/dL (ref 70–99)
Glucose-Capillary: 80 mg/dL (ref 70–99)
Glucose-Capillary: 89 mg/dL (ref 70–99)
Glucose-Capillary: 105 mg/dL — ABNORMAL HIGH (ref 70–99)

## 2023-07-21 LAB — CBC
HCT: 31.3 % — ABNORMAL LOW (ref 39.0–52.0)
Hemoglobin: 10.5 g/dL — ABNORMAL LOW (ref 13.0–17.0)
MCH: 28.8 pg (ref 26.0–34.0)
MCHC: 33.5 g/dL (ref 30.0–36.0)
MCV: 86 fL (ref 80.0–100.0)
Platelets: 245 10*3/uL (ref 150–400)
RBC: 3.64 MIL/uL — ABNORMAL LOW (ref 4.22–5.81)
RDW: 14 % (ref 11.5–15.5)
WBC: 8 10*3/uL (ref 4.0–10.5)
nRBC: 0 % (ref 0.0–0.2)

## 2023-07-21 LAB — MAGNESIUM: Magnesium: 2.3 mg/dL (ref 1.7–2.4)

## 2023-07-21 LAB — PHOSPHORUS: Phosphorus: 2.3 mg/dL — ABNORMAL LOW (ref 2.5–4.6)

## 2023-07-21 LAB — HOMOCYSTEINE: Homocysteine: 6.2 umol/L (ref 0.0–14.5)

## 2023-07-21 MED ORDER — SODIUM PHOSPHATES 45 MMOLE/15ML IV SOLN
15.0000 mmol | Freq: Once | INTRAVENOUS | Status: AC
  Administered 2023-07-21: 15 mmol via INTRAVENOUS
  Filled 2023-07-21: qty 5

## 2023-07-21 MED ORDER — HYDROCODONE-ACETAMINOPHEN 7.5-325 MG PO TABS
1.0000 | ORAL_TABLET | ORAL | Status: DC | PRN
  Administered 2023-07-21 – 2023-07-27 (×24): 1 via ORAL
  Filled 2023-07-21 (×26): qty 1

## 2023-07-21 MED ORDER — POTASSIUM & SODIUM PHOSPHATES 280-160-250 MG PO PACK
2.0000 | PACK | Freq: Three times a day (TID) | ORAL | Status: DC
  Administered 2023-07-21: 2 via ORAL
  Filled 2023-07-21 (×2): qty 2

## 2023-07-21 NOTE — Progress Notes (Signed)
Progress Note   Patient: Shane Arakelian Sr. [id number]:[id number] DOB: 1986/01/07 DOA: 07/17/2023     4 DOS: the patient was seen and examined on 07/21/2023   Brief hospital course: Shane Garbo Sr. is a 37 y.o. male with medical history significant of prior history of alcoholism who actually quit a few months ago, tobacco abuse, prior history of pancreatitis who presents to the ER with abdominal pain and nausea.  Upon arriving the emergency room, her heart rate was 147, respiratory rate was 34, lab study significant leukocytosis of 21, lactic acid 3.7.  Patient also has significant abdominal tenderness with rebound tenderness.  CT abdomen/pelvis showed moderate to large ascites with peritoneal enhancement.  Small pancreatic pseudocyst. Patient was started on IV fluids, pain medicine.  Due to concern for peritonitis, patient was started on Rocephin. Consult from General surgery and GI are obtained, paracentesis performed 9/8.      Principal Problem:   Alcohol induced acute pancreatitis Active Problems:   Severe malnutrition (HCC)   Sinus tachycardia   Pancreatic cyst   Alcohol use disorder, moderate, dependence (HCC)   Hyponatremia   Tobacco abuse   AKI (acute kidney injury) (HCC)   Protein-calorie malnutrition, severe   Pancreatitis   Hypomagnesemia   Spontaneous bacterial peritonitis (HCC)   Severe sepsis (HCC)   Reactive thrombocytosis   Normal anion gap metabolic acidosis   Hypocalcemia   Pancreatic ascites   Major depression   Assessment and Plan:  Severe sepsis. Spontaneous bacterial peritonitis. Patient met sepsis criteria with significant tachycardia, tachypnea and severe leukocytosis.  Patient also has organ failure with acute kidney injury, lactic acidosis. Based on my examination, patient had a significant peritonitis, evidenced by significant tenderness and rebound tenderness.  I personally reviewed patient's CT scan images, there is no evidence of organ perforation.  Due to  significant rebound tenderness, I repeated stat KUB/chest x-ray, still did not show any evidence of organ perforation.   Patient is started on Rocephin, blood culture so far negative. Paracentesis is performed today with removing of 5.6 L of ascites, patient also giving albumin after. Cell count could not be performed on ascites.  Cultures so far negative. Will continue 5-day dose of antibiotics, last dose tomorrow.   Alcoholic acute pancreatitis with chronic pancreatitis.   Small pseudocyst. Pancreatic ascites. Patient had increased lipase, consistent with pancreatitis.  Patient also had history of chronic diarrhea, will continue Creon. Patient has been seen by general surgery and GI, condition most likely due to pancreatic ascites with disruption of pancreatic duct.  I have been contacted with [place] ERCP physician, unfortunately, they cannot take patient due to complexity. I also contact the [facility], spoke with Dr. Arty Baumgartner 9/8, Dr. Penny Pia has scheduled ERCP and pancreatic stent on Thursday. Patient needs to be transferred [facility] GI procedure room (in the basement) [phone] by 10am Thursday. Wants to have a repeated paracentesis on Wednesday.  Also discussed with Dr. Tobi Bastos, started octreotide 200 mg IV twice a day 9/9. Patient also has severe hypoalbuminemia, to prevent rapid recommendation of ascites, received 50 g albumin.    Major depression. Patient has a significant depressed mood,started Celexa.     Acute kidney injury. Hyponatremia. Metabolic acidosis. Hypomagnesemia. Hypocalcemia. Hypophosphatemia. Has received IV magnesium, calcium.  Condition improving, developed mild hypophosphatemia, give oral treatment.   Severe protein calorie malnutrition. Start protein supplements.   Reactive thrombocytosis. Secondary to sepsis, improved.   Uncontrolled type 2 diabetes with hyperglycemia  Patient on sliding scale insulin.  Patient is on liquid diet.         Subjective:  Patient feels better with abdominal pain.  Tolerating liquid diet.  Physical Exam: Vitals:   07/20/23 1954 07/20/23 2358 07/21/23 0359 07/21/23 0743  BP: (!) 144/88 (!) 136/96 (!) 138/90 (!) 134/96  Pulse: 89 96 85 89  Resp: 20 18 18 18   Temp: 98 F (36.7 C) 98.6 F (37 C) 98.5 F (36.9 C) 98.3 F (36.8 C)  TempSrc:      SpO2: 96% 92% 95% 96%  Weight:      Height:       General exam: Appears calm and comfortable  Respiratory system: Clear to auscultation. Respiratory effort normal. Cardiovascular system: S1 & S2 heard, RRR. No JVD, murmurs, rubs, gallops or clicks. No pedal edema. Gastrointestinal system: Abdomen is distended, soft and mildly tender. No organomegaly or masses felt. Normal bowel sounds heard. Central nervous system: Alert and oriented. No focal neurological deficits. Extremities: Symmetric 5 x 5 power. Skin: No rashes, lesions or ulcers Psychiatry:  Mood & affect appropriate.    Data Reviewed:  Results reviewed.  Family Communication: None  Disposition: Status is: Inpatient Remains inpatient appropriate because: Severity of disease, IV treatment, inpatient procedure needed.     Time spent: 50 minutes  Author: Marrion Coy, MD 07/21/2023 10:51 AM  For on call review [email or web address].

## 2023-07-21 NOTE — Plan of Care (Signed)
  Problem: Education: Goal: Knowledge of General Education information will improve Description: Including pain rating scale, medication(s)/side effects and non-pharmacologic comfort measures Outcome: Progressing   Problem: Health Behavior/Discharge Planning: Goal: Ability to manage health-related needs will improve Outcome: Progressing   Problem: Clinical Measurements: Goal: Ability to maintain clinical measurements within normal limits will improve Outcome: Progressing Goal: Will remain free from infection Outcome: Progressing Goal: Diagnostic test results will improve Outcome: Progressing Goal: Respiratory complications will improve Outcome: Progressing Goal: Cardiovascular complication will be avoided Outcome: Progressing   Problem: Activity: Goal: Risk for activity intolerance will decrease Outcome: Progressing   Problem: Nutrition: Goal: Adequate nutrition will be maintained Outcome: Progressing   Problem: Coping: Goal: Level of anxiety will decrease Outcome: Progressing   Problem: Elimination: Goal: Will not experience complications related to bowel motility Outcome: Progressing Goal: Will not experience complications related to urinary retention Outcome: Progressing   Problem: Pain Managment: Goal: General experience of comfort will improve Outcome: Progressing   Problem: Safety: Goal: Ability to remain free from injury will improve Outcome: Progressing   Problem: Skin Integrity: Goal: Risk for impaired skin integrity will decrease Outcome: Progressing   Problem: Coping: Goal: Ability to adjust to condition or change in health will improve Outcome: Progressing   Problem: Skin Integrity: Goal: Risk for impaired skin integrity will decrease Outcome: Progressing   Problem: Tissue Perfusion: Goal: Adequacy of tissue perfusion will improve Outcome: Progressing

## 2023-07-21 NOTE — Plan of Care (Signed)
  Problem: Education: Goal: Knowledge of General Education information will improve Description: Including pain rating scale, medication(s)/side effects and non-pharmacologic comfort measures Outcome: Progressing   Problem: Clinical Measurements: Goal: Respiratory complications will improve Outcome: Progressing   Problem: Clinical Measurements: Goal: Cardiovascular complication will be avoided Outcome: Progressing   Problem: Activity: Goal: Risk for activity intolerance will decrease Outcome: Progressing   Problem: Elimination: Goal: Will not experience complications related to bowel motility Outcome: Progressing   Problem: Pain Managment: Goal: General experience of comfort will improve Outcome: Progressing

## 2023-07-22 ENCOUNTER — Inpatient Hospital Stay: Payer: Medicaid Other

## 2023-07-22 DIAGNOSIS — K863 Pseudocyst of pancreas: Secondary | ICD-10-CM

## 2023-07-22 DIAGNOSIS — A419 Sepsis, unspecified organism: Secondary | ICD-10-CM | POA: Diagnosis not present

## 2023-07-22 DIAGNOSIS — N179 Acute kidney failure, unspecified: Secondary | ICD-10-CM

## 2023-07-22 DIAGNOSIS — K852 Alcohol induced acute pancreatitis without necrosis or infection: Secondary | ICD-10-CM | POA: Diagnosis not present

## 2023-07-22 DIAGNOSIS — D75838 Other thrombocytosis: Secondary | ICD-10-CM

## 2023-07-22 DIAGNOSIS — K652 Spontaneous bacterial peritonitis: Secondary | ICD-10-CM | POA: Diagnosis not present

## 2023-07-22 LAB — COMPREHENSIVE METABOLIC PANEL
ALT: 6 U/L (ref 0–44)
AST: 11 U/L — ABNORMAL LOW (ref 15–41)
Albumin: 1.9 g/dL — ABNORMAL LOW (ref 3.5–5.0)
Alkaline Phosphatase: 58 U/L (ref 38–126)
Anion gap: 7 (ref 5–15)
BUN: 7 mg/dL (ref 6–20)
CO2: 32 mmol/L (ref 22–32)
Calcium: 7.7 mg/dL — ABNORMAL LOW (ref 8.9–10.3)
Chloride: 93 mmol/L — ABNORMAL LOW (ref 98–111)
Creatinine, Ser: 0.6 mg/dL — ABNORMAL LOW (ref 0.61–1.24)
GFR, Estimated: >60 mL/min (ref >60)
Glucose, Bld: 93 mg/dL (ref 70–99)
Potassium: 4.2 mmol/L (ref 3.5–5.1)
Sodium: 132 mmol/L — ABNORMAL LOW (ref 135–145)
Total Bilirubin: 0.4 mg/dL (ref 0.3–1.2)
Total Protein: 4.7 g/dL — ABNORMAL LOW (ref 6.5–8.1)

## 2023-07-22 LAB — BODY FLUID CULTURE W GRAM STAIN
Culture: NO GROWTH
Gram Stain: NONE SEEN

## 2023-07-22 LAB — PHOSPHORUS: Phosphorus: 4.1 mg/dL (ref 2.5–4.6)

## 2023-07-22 LAB — GLUCOSE, CAPILLARY
Glucose-Capillary: 78 mg/dL (ref 70–99)
Glucose-Capillary: 116 mg/dL — ABNORMAL HIGH (ref 70–99)
Glucose-Capillary: 100 mg/dL — ABNORMAL HIGH (ref 70–99)
Glucose-Capillary: 95 mg/dL (ref 70–99)

## 2023-07-22 LAB — LIPASE, FLUID: Lipase-Fluid: 5439 U/L

## 2023-07-22 MED ORDER — LIDOCAINE HCL (PF) 1 % IJ SOLN
10.0000 mL | Freq: Once | INTRAMUSCULAR | Status: AC
  Administered 2023-07-22: 10 mL via INTRADERMAL
  Filled 2023-07-22: qty 10

## 2023-07-22 NOTE — Assessment & Plan Note (Addendum)
Secondary to spontaneous bacterial peritonitis.  No growth out of culture.  Patient completed antibiotics with Rocephin but put on empiric Zosyn with worsening abdominal pain yesterday

## 2023-07-22 NOTE — Assessment & Plan Note (Signed)
Continue supplements

## 2023-07-22 NOTE — Procedures (Signed)
PROCEDURE SUMMARY:  Successful image-guided paracentesis from the left lower abdomen.  Yielded 400 milliliters of golden yellow fluid.  No immediate complications.  EBL = trace. Patient tolerated well.   Specimen was not sent for labs.  Please see imaging section of Epic for full dictation.   Kennieth Francois PA-C 07/22/2023 3:03 PM

## 2023-07-22 NOTE — Assessment & Plan Note (Addendum)
Last calcium 7.8

## 2023-07-22 NOTE — Assessment & Plan Note (Signed)
On Celexa 

## 2023-07-22 NOTE — Assessment & Plan Note (Signed)
Resolved

## 2023-07-22 NOTE — Assessment & Plan Note (Addendum)
Completed course of Rocephin but placed on antibiotics with worsening abdominal pain this morning

## 2023-07-22 NOTE — Assessment & Plan Note (Signed)
Improved

## 2023-07-22 NOTE — Assessment & Plan Note (Addendum)
Try to advance to soft diet today

## 2023-07-22 NOTE — Progress Notes (Signed)
Progress Note   Patient: Shane Mckinney Sr. ZOX:[id number] DOB: [birth year] DOA: 07/17/2023     5 DOS: the patient was seen and examined on 07/22/2023   Brief hospital course: Idiris Capano Sr. is a 37 y.o. male with medical history significant of prior history of alcoholism who actually quit a few months ago, tobacco abuse, prior history of pancreatitis who presents to the ER with abdominal pain and nausea.  Upon arriving the emergency room, her heart rate was 147, respiratory rate was 34, lab study significant leukocytosis of 21, lactic acid 3.7.  Patient also has significant abdominal tenderness with rebound tenderness.  CT abdomen/pelvis showed moderate to large ascites with peritoneal enhancement.  Small pancreatic pseudocyst. Patient was started on IV fluids, pain medicine.  Due to concern for peritonitis, patient was started on Rocephin.  9/8.  5.8 L drawn off with paracentesis 9/11.  400 mL drawn off with paracentesis.  Confirmed with [facility] that the plan is to transfer over to [facility] for procedure starting at 10 AM.    Assessment and Plan: * Severe sepsis (HCC) Secondary to spontaneous bacterial peritonitis.  No growth out of culture.  On IV Rocephin.  Spontaneous bacterial peritonitis (HCC) Continue Rocephin  Pancreatic pseudocyst Large pseudocyst seen on CT scan of the abdomen.  Patient will go to [facility] for procedure on 9/12.  Alcohol induced acute pancreatitis Patient on full liquid diet.  Will be n.p.o. after midnight for procedure tomorrow.  Pancreatic ascites Will likely need serial paracentesis.  Major depression On Celexa  Hypocalcemia Last calcium 7.7  Normal anion gap metabolic acidosis Improved  Reactive thrombocytosis Platelet count normalized  Hypomagnesemia Replaced  Protein-calorie malnutrition, severe Continue supplements  AKI (acute kidney injury) (HCC) Resolved        Subjective: Patient trying to relax today.  Feels okay.  Seen before  paracentesis today.  Physical Exam: Vitals:   07/22/23 0846 07/22/23 1250 07/22/23 1300 07/22/23 1551  BP: (!) 149/97 (!) 141/87 (!) 132/90 (!) 137/95  Pulse: 79 77 83 97  Resp: 18 16  17   Temp: 97.9 F (36.6 C) 98 F (36.7 C)  98.2 F (36.8 C)  TempSrc: Oral Axillary  Oral  SpO2: 98% 95% 95% 96%  Weight:      Height:       Physical Exam HENT:     Head: Normocephalic.     Mouth/Throat:     Pharynx: No oropharyngeal exudate.  Eyes:     General: Lids are normal.     Conjunctiva/sclera: Conjunctivae normal.  Cardiovascular:     Rate and Rhythm: Normal rate and regular rhythm.     Heart sounds: Normal heart sounds, S1 normal and S2 normal.  Pulmonary:     Breath sounds: No decreased breath sounds, wheezing, rhonchi or rales.  Abdominal:     General: There is distension.     Palpations: Abdomen is soft.     Tenderness: There is no abdominal tenderness.  Musculoskeletal:     Right lower leg: No swelling.     Left lower leg: No swelling.  Skin:    General: Skin is warm.     Findings: No rash.  Neurological:     Mental Status: He is alert and oriented to person, place, and time.     Data Reviewed: Sodium 132, creatinine 0.6, white blood cell count 8.0, hemoglobin 10.5, platelet count 245 Disposition: Status is: Inpatient Remains inpatient appropriate because: Patient will go to [facility] tomorrow morning and then return back after  procedure.  Planned Discharge Destination: Home    Time spent: 28 minutes  Author: Alford Highland, MD 07/22/2023 4:29 PM  For on call review [email or web address].

## 2023-07-22 NOTE — Assessment & Plan Note (Addendum)
Large pseudocyst seen on CT scan of the abdomen.  Patient had pancreatic stents placed by [facility] on 9/12.

## 2023-07-22 NOTE — Assessment & Plan Note (Signed)
Platelet count normalized

## 2023-07-22 NOTE — Assessment & Plan Note (Addendum)
Patient 5.9 L drawn off on 9/8.  400 mL drawn off on 9/11 with hemoperitoneum afterwards.

## 2023-07-22 NOTE — Assessment & Plan Note (Signed)
Replaced. °

## 2023-07-23 ENCOUNTER — Encounter: Payer: Self-pay | Admitting: Internal Medicine

## 2023-07-23 DIAGNOSIS — R338 Other retention of urine: Secondary | ICD-10-CM | POA: Insufficient documentation

## 2023-07-23 DIAGNOSIS — K852 Alcohol induced acute pancreatitis without necrosis or infection: Secondary | ICD-10-CM | POA: Diagnosis not present

## 2023-07-23 DIAGNOSIS — K652 Spontaneous bacterial peritonitis: Secondary | ICD-10-CM | POA: Diagnosis not present

## 2023-07-23 DIAGNOSIS — A419 Sepsis, unspecified organism: Secondary | ICD-10-CM | POA: Diagnosis not present

## 2023-07-23 DIAGNOSIS — K863 Pseudocyst of pancreas: Secondary | ICD-10-CM | POA: Diagnosis not present

## 2023-07-23 LAB — CULTURE, BLOOD (ROUTINE X 2)
Culture: NO GROWTH
Culture: NO GROWTH
Special Requests: ADEQUATE

## 2023-07-23 LAB — FUNGAL ORGANISM REFLEX

## 2023-07-23 LAB — FUNGUS CULTURE WITH STAIN

## 2023-07-23 LAB — FUNGUS CULTURE RESULT

## 2023-07-23 LAB — GLUCOSE, CAPILLARY: Glucose-Capillary: 141 mg/dL — ABNORMAL HIGH (ref 70–99)

## 2023-07-23 MED ORDER — TAMSULOSIN HCL 0.4 MG PO CAPS
0.4000 mg | ORAL_CAPSULE | Freq: Every day | ORAL | Status: DC
  Administered 2023-07-23 – 2023-07-27 (×5): 0.4 mg via ORAL
  Filled 2023-07-23 (×5): qty 1

## 2023-07-23 MED ORDER — DEXTROSE-SODIUM CHLORIDE 5-0.9 % IV SOLN: INTRAVENOUS | Status: DC

## 2023-07-23 MED ORDER — DICLOFENAC SUPPOSITORY 100 MG
100.0000 mg | Freq: Once | RECTAL | Status: DC
  Filled 2023-07-23: qty 1

## 2023-07-23 NOTE — Progress Notes (Signed)
Progress Note   Patient: Shane Morini Sr. ZOX:[id number] DOB: [birth year] DOA: 07/17/2023     6 DOS: the patient was seen and examined on 07/23/2023   Brief hospital course: Shane Irigoyen Sr. is a 37 y.o. male with medical history significant of prior history of alcoholism who actually quit a few months ago, tobacco abuse, prior history of pancreatitis who presents to the ER with abdominal pain and nausea.  Upon arriving the emergency room, her heart rate was 147, respiratory rate was 34, lab study significant leukocytosis of 21, lactic acid 3.7.  Patient also has significant abdominal tenderness with rebound tenderness.  CT abdomen/pelvis showed moderate to large ascites with peritoneal enhancement.  Small pancreatic pseudocyst. Patient was started on IV fluids, pain medicine.  Due to concern for peritonitis, patient was started on Rocephin.  9/8.  5.8 L drawn off with paracentesis 9/11.  400 mL drawn off with paracentesis.  Confirmed with [facility] that the plan is to transfer over to [facility] for procedure starting at 10 AM. 9/12.  Patient seen prior to transfer to [facility] for ERCP procedure.   Assessment and Plan: * Severe sepsis (HCC) Secondary to spontaneous bacterial peritonitis.  No growth out of culture.  Patient will complete antibiotics today.  Spontaneous bacterial peritonitis [facility]) Patient will complete antibiotics today.  Pancreatic pseudocyst Large pseudocyst seen on CT scan of the abdomen.  Patient will go to [facility] for ERCP procedure on 9/12.  Alcohol induced acute pancreatitis N.p.o. for procedure today.  Can restart diet if feeling okay after procedure.  Hyponatremia Last sodium 132  Pancreatic ascites Patient 5.9 L drawn off on 9/8.  400 mL drawn off on 9/11.  Acute urinary retention Start Flomax and hopefully can take out Foley in a couple days.  Major depression On Celexa  Hypocalcemia Last calcium 7.7  Normal anion gap metabolic acidosis Improved  Reactive  thrombocytosis Platelet count normalized  Hypomagnesemia Replaced  Protein-calorie malnutrition, severe Continue supplements  AKI (acute kidney injury) (HCC) Resolved        Subjective: Patient seen this morning prior to transfer to [facility] for procedure.  Was nervous about the procedure.  Has some abdominal pain.  Admitted for SBP.  Physical Exam: Vitals:   07/22/23 2059 07/22/23 2347 07/23/23 0526 07/23/23 0757  BP: (!) 133/90 (!) 124/90 131/89 129/87  Pulse: 86 80 83 86  Resp: 17 15 17 16   Temp: 98.3 F (36.8 C) 98.3 F (36.8 C) 98.4 F (36.9 C) 98.7 F (37.1 C)  TempSrc:   Oral Oral  SpO2: 96% 97% 97% 95%  Weight:      Height:       Physical Exam HENT:     Head: Normocephalic.     Mouth/Throat:     Pharynx: No oropharyngeal exudate.  Eyes:     General: Lids are normal.     Conjunctiva/sclera: Conjunctivae normal.  Cardiovascular:     Rate and Rhythm: Normal rate and regular rhythm.     Heart sounds: Normal heart sounds, S1 normal and S2 normal.  Pulmonary:     Breath sounds: No decreased breath sounds, wheezing, rhonchi or rales.  Abdominal:     General: There is distension.     Palpations: Abdomen is soft.     Tenderness: There is no abdominal tenderness.  Musculoskeletal:     Right lower leg: No swelling.     Left lower leg: No swelling.  Skin:    General: Skin is warm.     Findings:  No rash.  Neurological:     Mental Status: He is alert and oriented to person, place, and time.     Data Reviewed: Sodium 132, creatinine 0.6, albumin 1.9  Family Communication: Spoke with wife at bedside  Disposition: Status is: Inpatient Remains inpatient appropriate because: Transfer to [facility] for ERCP procedure.  Patient will be transferred back to [facility] after that.  Planned Discharge Destination: Home    Time spent: 28 minutes  Author: Alford Highland, MD 07/23/2023 12:20 PM  For on call review [email or web address].

## 2023-07-23 NOTE — Assessment & Plan Note (Addendum)
Start Flomax and hopefully can take out Foley potentially tomorrow.

## 2023-07-23 NOTE — Plan of Care (Signed)
  Problem: Education: Goal: Knowledge of General Education information will improve Description: Including pain rating scale, medication(s)/side effects and non-pharmacologic comfort measures Outcome: Progressing   Problem: Clinical Measurements: Goal: Respiratory complications will improve Outcome: Progressing   Problem: Clinical Measurements: Goal: Cardiovascular complication will be avoided Outcome: Progressing   Problem: Activity: Goal: Risk for activity intolerance will decrease Outcome: Progressing   Problem: Elimination: Goal: Will not experience complications related to bowel motility Outcome: Progressing   Problem: Elimination: Goal: Will not experience complications related to urinary retention Outcome: Progressing   Problem: Pain Managment: Goal: General experience of comfort will improve Outcome: Progressing

## 2023-07-23 NOTE — Assessment & Plan Note (Addendum)
Last sodium normal range

## 2023-07-23 NOTE — Assessment & Plan Note (Deleted)
Continue supplements once back on a diet.

## 2023-07-24 ENCOUNTER — Inpatient Hospital Stay: Payer: Medicaid Other

## 2023-07-24 DIAGNOSIS — D62 Acute posthemorrhagic anemia: Secondary | ICD-10-CM

## 2023-07-24 DIAGNOSIS — R188 Other ascites: Secondary | ICD-10-CM | POA: Diagnosis not present

## 2023-07-24 DIAGNOSIS — R338 Other retention of urine: Secondary | ICD-10-CM

## 2023-07-24 DIAGNOSIS — K661 Hemoperitoneum: Secondary | ICD-10-CM | POA: Diagnosis not present

## 2023-07-24 DIAGNOSIS — K860 Alcohol-induced chronic pancreatitis: Secondary | ICD-10-CM | POA: Diagnosis not present

## 2023-07-24 DIAGNOSIS — K852 Alcohol induced acute pancreatitis without necrosis or infection: Secondary | ICD-10-CM | POA: Diagnosis not present

## 2023-07-24 DIAGNOSIS — K922 Gastrointestinal hemorrhage, unspecified: Secondary | ICD-10-CM | POA: Diagnosis not present

## 2023-07-24 DIAGNOSIS — F1020 Alcohol dependence, uncomplicated: Secondary | ICD-10-CM | POA: Diagnosis not present

## 2023-07-24 DIAGNOSIS — R140 Abdominal distension (gaseous): Secondary | ICD-10-CM | POA: Diagnosis not present

## 2023-07-24 LAB — COMPREHENSIVE METABOLIC PANEL
ALT: 7 U/L (ref 0–44)
AST: 12 U/L — ABNORMAL LOW (ref 15–41)
Albumin: 1.8 g/dL — ABNORMAL LOW (ref 3.5–5.0)
Alkaline Phosphatase: 54 U/L (ref 38–126)
Anion gap: 11 (ref 5–15)
BUN: 7 mg/dL (ref 6–20)
CO2: 26 mmol/L (ref 22–32)
Calcium: 7.8 mg/dL — ABNORMAL LOW (ref 8.9–10.3)
Chloride: 97 mmol/L — ABNORMAL LOW (ref 98–111)
Creatinine, Ser: 0.65 mg/dL (ref 0.61–1.24)
GFR, Estimated: >60 mL/min (ref >60)
Glucose, Bld: 162 mg/dL — ABNORMAL HIGH (ref 70–99)
Potassium: 4.2 mmol/L (ref 3.5–5.1)
Sodium: 134 mmol/L — ABNORMAL LOW (ref 135–145)
Total Bilirubin: 0.4 mg/dL (ref 0.3–1.2)
Total Protein: 4.6 g/dL — ABNORMAL LOW (ref 6.5–8.1)

## 2023-07-24 LAB — CBC WITH DIFFERENTIAL/PLATELET
Abs Immature Granulocytes: 1.06 10*3/uL — ABNORMAL HIGH (ref 0.00–0.07)
Basophils Absolute: 0.1 10*3/uL (ref 0.0–0.1)
Basophils Relative: 1 %
Eosinophils Absolute: 0.1 10*3/uL (ref 0.0–0.5)
Eosinophils Relative: 0 %
HCT: 27.3 % — ABNORMAL LOW (ref 39.0–52.0)
Hemoglobin: 9.3 g/dL — ABNORMAL LOW (ref 13.0–17.0)
Immature Granulocytes: 7 %
Lymphocytes Relative: 12 %
Lymphs Abs: 1.7 10*3/uL (ref 0.7–4.0)
MCH: 28.6 pg (ref 26.0–34.0)
MCHC: 34.1 g/dL (ref 30.0–36.0)
MCV: 84 fL (ref 80.0–100.0)
Monocytes Absolute: 1.4 10*3/uL — ABNORMAL HIGH (ref 0.1–1.0)
Monocytes Relative: 9 %
Neutro Abs: 10.4 10*3/uL — ABNORMAL HIGH (ref 1.7–7.7)
Neutrophils Relative %: 71 %
Platelets: 378 10*3/uL (ref 150–400)
RBC: 3.25 MIL/uL — ABNORMAL LOW (ref 4.22–5.81)
RDW: 14 % (ref 11.5–15.5)
Smear Review: NORMAL
WBC: 14.8 10*3/uL — ABNORMAL HIGH (ref 4.0–10.5)
nRBC: 0 % (ref 0.0–0.2)

## 2023-07-24 LAB — HEMOGLOBIN
Hemoglobin: 8 g/dL — ABNORMAL LOW (ref 13.0–17.0)
Hemoglobin: 9.1 g/dL — ABNORMAL LOW (ref 13.0–17.0)

## 2023-07-24 LAB — PROTIME-INR
INR: 1.1 (ref 0.8–1.2)
Prothrombin Time: 14.6 s (ref 11.4–15.2)

## 2023-07-24 LAB — LACTIC ACID, PLASMA
Lactic Acid, Venous: 0.8 mmol/L (ref 0.5–1.9)
Lactic Acid, Venous: 0.8 mmol/L (ref 0.5–1.9)

## 2023-07-24 LAB — GLUCOSE, CAPILLARY: Glucose-Capillary: 158 mg/dL — ABNORMAL HIGH (ref 70–99)

## 2023-07-24 LAB — PREPARE RBC (CROSSMATCH)

## 2023-07-24 LAB — MAGNESIUM: Magnesium: 1.6 mg/dL — ABNORMAL LOW (ref 1.7–2.4)

## 2023-07-24 MED ORDER — IOHEXOL 350 MG/ML SOLN
100.0000 mL | Freq: Once | INTRAVENOUS | Status: AC | PRN
  Administered 2023-07-24: 100 mL via INTRAVENOUS

## 2023-07-24 MED ORDER — METOPROLOL TARTRATE 5 MG/5ML IV SOLN: 5.0000 mg | INTRAVENOUS | Status: DC | PRN

## 2023-07-24 MED ORDER — MAGNESIUM SULFATE 2 GM/50ML IV SOLN
2.0000 g | Freq: Once | INTRAVENOUS | Status: AC
  Administered 2023-07-24: 2 g via INTRAVENOUS
  Filled 2023-07-24: qty 50

## 2023-07-24 MED ORDER — SODIUM CHLORIDE 0.9% IV SOLUTION: Freq: Once | INTRAVENOUS | Status: AC

## 2023-07-24 MED ORDER — SODIUM CHLORIDE 0.9 % IV BOLUS
250.0000 mL | Freq: Once | INTRAVENOUS | Status: AC
  Administered 2023-07-24: 250 mL via INTRAVENOUS

## 2023-07-24 MED ORDER — IOHEXOL 300 MG/ML  SOLN
100.0000 mL | Freq: Once | INTRAMUSCULAR | Status: AC | PRN
  Administered 2023-07-24: 100 mL via INTRAVENOUS

## 2023-07-24 MED ORDER — ACETAMINOPHEN 325 MG PO TABS
650.0000 mg | ORAL_TABLET | Freq: Once | ORAL | Status: AC
  Administered 2023-07-24: 650 mg via ORAL
  Filled 2023-07-24: qty 2

## 2023-07-24 MED ORDER — PANTOPRAZOLE SODIUM 40 MG IV SOLR
40.0000 mg | Freq: Two times a day (BID) | INTRAVENOUS | Status: DC
  Administered 2023-07-24 – 2023-07-27 (×7): 40 mg via INTRAVENOUS
  Filled 2023-07-24 (×7): qty 10

## 2023-07-24 MED ORDER — PIPERACILLIN-TAZOBACTAM 3.375 G IVPB
3.3750 g | Freq: Three times a day (TID) | INTRAVENOUS | Status: DC
  Administered 2023-07-24 – 2023-07-26 (×7): 3.375 g via INTRAVENOUS
  Filled 2023-07-24 (×7): qty 50

## 2023-07-24 NOTE — TOC Progression Note (Signed)
Transition of Care (TOC) - Progression Note    Patient Details  Name: Shane Spearman Sr. MRN: [id number] Date of Birth: 1986-07-23  Transition of Care [facility]) CM/SW Contact  Truddie Hidden, RN Phone Number: 07/24/2023, 2:54 PM  Clinical Narrative:    Attempt to contact patient's significant other. No answer. Unable to leave a message.         Expected Discharge Plan and Services                                               Social Determinants of Health (SDOH) Interventions SDOH Screenings   Food Insecurity: No Food Insecurity (07/17/2023)  Housing: Low Risk  (07/17/2023)  Transportation Needs: No Transportation Needs (07/17/2023)  Utilities: Not At Risk (07/17/2023)  Tobacco Use: Medium Risk (07/23/2023)   Received from [facility]    Readmission Risk Interventions     No data to display

## 2023-07-24 NOTE — TOC Progression Note (Signed)
Transition of Care (TOC) - Progression Note    Patient Details  Name: Shane Frederique Sr. MRN: [id number] Date of Birth: 04/01/1986  Transition of Care [facility]) CM/SW Contact  Truddie Hidden, RN Phone Number: 07/24/2023, 10:26 AM  Clinical Narrative:    Spoke with patient. He stated his wife had some concerns but had already left for the day.         Expected Discharge Plan and Services                                               Social Determinants of Health (SDOH) Interventions SDOH Screenings   Food Insecurity: No Food Insecurity (07/17/2023)  Housing: Low Risk  (07/17/2023)  Transportation Needs: No Transportation Needs (07/17/2023)  Utilities: Not At Risk (07/17/2023)  Tobacco Use: Medium Risk (07/23/2023)   Received from [facility]    Readmission Risk Interventions     No data to display

## 2023-07-24 NOTE — Progress Notes (Addendum)
Wyline Mood , MD [address], [place], [place], [address] [address], [address], [place], [place], [address] Phone: [phone]  Fax: [phone]   Shane Hartlieb Sr. is being followed for pancreatitis   Subjective: Has had 2 bowel movements since ERCP- says were large but he didn't see the color. Apparently there has been some blood. No hematemesis, no change in abdominal pain which he has had for a while    Objective: Vital signs in last 24 hours: Vitals:   07/23/23 2338 07/24/23 0422 07/24/23 0700 07/24/23 1100  BP: 133/85 105/71 117/81 (!) 126/93  Pulse: 90 (!) 112 90 (!) 106  Resp: 18 20 12 14   Temp: 98.3 F (36.8 C) 98.3 F (36.8 C)    TempSrc:      SpO2: 98% 99% 100% 98%  Weight:      Height:       Weight change:   Intake/Output Summary (Last 24 hours) at 07/24/2023 1117 Last data filed at 07/24/2023 1101 Gross per 24 hour  Intake 910.43 ml  Output 1201 ml  Net -290.57 ml     Exam:  Abdomen: soft, nontender,distended  normal bowel sounds   Lab Results: @LABTEST2 @ Micro Results: Recent Results (from the past 240 hour(s))  Culture, blood (Routine X 2) w Reflex to ID Panel     Status: None   Collection Time: 07/18/23  1:38 AM   Specimen: BLOOD  Result Value Ref Range Status   Specimen Description BLOOD BLOOD LEFT ARM  Final   Special Requests   Final    BOTTLES DRAWN AEROBIC ONLY Blood Culture results may not be optimal due to an inadequate volume of blood received in culture bottles   Culture   Final    NO GROWTH 5 DAYS Performed at [facility], [address] [place], [place] [address]    Report Status 07/23/2023 FINAL  Final  Culture, blood (Routine X 2) w Reflex to ID Panel     Status: None   Collection Time: 07/18/23  1:38 AM   Specimen: BLOOD  Result Value Ref Range Status   Specimen Description BLOOD BLOOD LEFT HAND  Final   Special Requests   Final    BOTTLES DRAWN AEROBIC AND ANAEROBIC Blood Culture adequate  volume   Culture   Final    NO GROWTH 5 DAYS Performed at [facility], [address], [place], [place] [address]    Report Status 07/23/2023 FINAL  Final  Aerobic Culture w Gram Stain (superficial specimen)     Status: None   Collection Time: 07/19/23  9:09 AM   Specimen: A: PATH Cytology Pleural fluid   B: PATH Cytology Peritoneal fluid  Result Value Ref Range Status   Specimen Description   Final    PERITONEAL Performed at [facility], [address], [place], [place] [address]    Special Requests   Final    PERITONEAL Performed at [facility], [address] [place], [place] [address]    Gram Stain NO WBC SEEN NO ORGANISMS SEEN   Final   Culture   Final    NO GROWTH 2 DAYS Performed at [facility], [address], [place], [place] [address]    Report Status 07/21/2023 FINAL  Final  Body fluid culture w Gram Stain     Status: None   Collection Time: 07/19/23  9:09 AM   Specimen: A: PATH Cytology Pleural fluid   B: PATH Cytology  Peritoneal fluid  Result Value Ref Range Status   Specimen Description   Final    PERITONEAL Performed at [facility], [address] [place], [place] [address]    Special Requests   Final    PERITONEAL Performed at [facility], [address] [place], [place] [address]    Gram Stain NO WBC SEEN NO ORGANISMS SEEN   Final   Culture   Final    NO GROWTH 3 DAYS Performed at [facility], [address], [place], [place] [address]    Report Status 07/22/2023 FINAL  Final   Studies/Results: CT ABDOMEN PELVIS W CONTRAST  Result Date: 07/24/2023 CLINICAL DATA:  Peritonitis.  Status post image guided paracentesis. EXAM: CT ABDOMEN AND PELVIS WITH CONTRAST TECHNIQUE: Multidetector CT imaging of the abdomen and pelvis was performed using the standard protocol following bolus administration of intravenous contrast. RADIATION DOSE REDUCTION: This exam was performed according to  the departmental dose-optimization program which includes automated exposure control, adjustment of the mA and/or kV according to patient size and/or use of iterative reconstruction technique. CONTRAST:  OMNIPAQUE IOHEXOL 300 MG/ML  SOLN COMPARISON:  07/17/2023 FINDINGS: Lower chest: Dependent collapse/consolidation noted both lower lobes, left greater than right, likely atelectasis. Small bilateral pleural effusions are new in the interval. Hepatobiliary: No suspicious focal abnormality within the liver parenchyma. Layering high attenuation material in the gallbladder is new in the interval, likely related to vicarious excretion of intravenous contrast material. No intrahepatic biliary duct dilatation. No evidence for common bile duct dilatation. Pancreas: Diffuse atrophy of pancreatic parenchyma is associated with diffuse dilatation of the main pancreatic duct. Interval development of a small gas bubble in the head of the pancreas, likely intraductal (axial 37/2 and sagittal 55/6). As noted previously, multiple scattered tiny cystic foci are seen in the pancreatic parenchyma including a dominant 17 mm collection in the uncinate process, stable. Spleen: No splenomegaly. No suspicious focal mass lesion. Adrenals/Urinary Tract: No adrenal nodule or mass. Kidneys unremarkable. No evidence for hydroureter. Bladder is decompressed by Foley catheter. A tiny gas bubble in the anterior bladder on 92/2 (sagittal 59/6) is compatible with the instrumentation. Stomach/Bowel: Stomach is unremarkable. No gastric wall thickening. No evidence of outlet obstruction. Duodenum is normally positioned as is the ligament of Treitz. No small bowel wall thickening. No small bowel dilatation. The terminal ileum is normal. The appendix is normal. No gross colonic mass. No colonic wall thickening. Vascular/Lymphatic: No abdominal aortic aneurysm. No abdominal aortic atherosclerotic calcification. Portal vein, superior mesenteric vein,  and splenic vein are patent. No complication involving the celiac axis or SMA. There is no gastrohepatic or hepatoduodenal ligament lymphadenopathy. No retroperitoneal or mesenteric lymphadenopathy. No pelvic sidewall lymphadenopathy. Reproductive: The prostate gland and seminal vesicles are unremarkable. Other: Similar large volume abdominopelvic ascites. Some of the fluid collections appear loculated and progressive in the interval including a 9.0 x 5.7 cm collection along the proximal stomach (image 23/2). Loculated fluid is seen around the caudate lobe on 23/2, also progressive in the interval. Fluid in the para colic gutters bilaterally is similar with some apparent enhancement of the adjacent peritoneum. In the interval since the previous study there is now more heterogeneous attenuation in the free fluid of the anterior pelvis (see images 72-79 of series 2. Given the rapid interval appearance, this is probably related to blood products. Infectious debris is also a consideration but there is no evidence for extraluminal gas within the ascites. Musculoskeletal: New asymmetry of the  inferior rectus sheath noted with enlargement of the lower left rectus musculature. No worrisome lytic or sclerotic osseous abnormality. IMPRESSION: 1. Interval development of heterogeneous attenuation in the free fluid of the left anterior pelvis. Given the rapid interval appearance and recent paracentesis, imaging features are felt to be most likely related to blood products. Infectious debris is also a consideration but this is considered less likely given CT imaging appearance. 2. New asymmetry of the inferior left rectus sheath with enlargement of the lower left rectus musculature. This is probably a small rectus sheath hematoma. 3. Similar large volume abdominopelvic ascites with interval progression of loculated fluid collections along the proximal stomach and around the caudate lobe. Evolving pseudocysts would be a  consideration. 4. Interval development of a small gas bubble in the head of the pancreas, likely intraductal. In the absence of recent instrumentation, infection would be a consideration. Reflux of gas from the duodenum into the pancreatic duct would also be a consideration. 5. New small bilateral pleural effusions with dependent collapse/consolidation in both lower lobes, left greater than right. Electronically Signed   By: Kennith Center M.D.   On: 07/24/2023 06:50   US Paracentesis  Result Date: 07/22/2023 INDICATION: History of alcoholic cirrhosis and pancreatitis. Request received for therapeutic paracentesis prior to patient having abdominal procedure performed at [facility] on 07/23/[facility]23. EXAM: ULTRASOUND GUIDED  PARACENTESIS MEDICATIONS: 6 cc 1% lidocaine COMPLICATIONS: None immediate. PROCEDURE: Informed written consent was obtained from the patient after a discussion of the risks, benefits and alternatives to treatment. A timeout was performed prior to the initiation of the procedure. Initial ultrasound scanning demonstrates a moderate amount of ascites within the left lower abdominal quadrant. The left lower abdomen was prepped and draped in the usual sterile fashion. 1% lidocaine was used for local anesthesia. Following this, a 19 gauge, 7-cm, Yueh catheter was introduced. An ultrasound image was saved for documentation purposes. The paracentesis was performed. The catheter was removed and a dressing was applied. The patient tolerated the procedure well without immediate post procedural complication. FINDINGS: A total of approximately 400 mL of golden yellow fluid was removed. Ordering provider did not request laboratory samples. IMPRESSION: Successful ultrasound-guided paracentesis yielding 400 mL of peritoneal fluid. PLAN: The patient has previously been formally evaluated by the [facility] and is being actively followed for potential  future intervention. Procedure performed by Mina Marble, PA-C Electronically Signed   By: Olive Bass M.D.   On: 07/22/2023 16:22   Medications: I have reviewed the patient's current medications. Scheduled Meds:  (feeding supplement) PROSource Plus  30 mL Oral TID BM   Chlorhexidine Gluconate Cloth  6 each Topical Daily   citalopram  20 mg Oral Daily   feeding supplement  1 Container Oral TID BM   lipase/protease/amylase  72,000 Units Oral TID WC   multivitamin with minerals  1 tablet Oral Daily   octreotide  200 mcg Subcutaneous Q12H   pantoprazole (PROTONIX) IV  40 mg Intravenous Q12H   tamsulosin  0.4 mg Oral QPC supper   Continuous Infusions:  dextrose 5 % and 0.9 % NaCl Stopped (07/24/23 0422)   piperacillin-tazobactam (ZOSYN)  IV 3.375 g (07/24/23 0805)   PRN Meds:.HYDROcodone-acetaminophen, HYDROmorphone (DILAUDID) injection, metoprolol tartrate, ondansetron **OR** ondansetron (ZOFRAN) IV   Assessment: Principal Problem:   Severe sepsis (HCC) Active Problems:   Hyponatremia   AKI (acute kidney injury) (HCC)   Sinus tachycardia   Protein-calorie malnutrition, severe   Alcohol use disorder,  moderate, dependence (HCC)   Alcohol induced acute pancreatitis   Pancreatic pseudocyst   Tobacco abuse   Pancreatitis   Hypomagnesemia   Spontaneous bacterial peritonitis (HCC)   Reactive thrombocytosis   Normal anion gap metabolic acidosis   Hypocalcemia   Pancreatic ascites   Major depression   Acute urinary retention    Shane Boldt Sr. is a 38 y.o. 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 There was no      post-sphincterotomy bleeding.  Due to difficult cannulation, a snare      papillectomy was done using ERBE electrocautery .One 7 Fr by 9 cm plastic stent with a full external      pigtail and a single internal flap was placed into the ventral      pancreatic duct. The stent was in good position. Coagulation for      hemostasis in the ampulla region using snare through the ERCP scope was      successful. Afterwards PuraStat was injected to prevent clinical bleeding. There was complete hemostasis    He may have had some bleeding after ERCP based on description - unclear if blood seen in stool is the blood loss related to yesterday proedure vs fresh . There may be some blood loss in the abdomen too based on imaging related to paracentesis. Discussed with Dr Renae Gloss : Monitor CBC and transfuse as needed, obtain CT angiogram and if bleeding from ERCP site then needs to go back to [facility] for hemostasis with side viewing scope if IR over here can't embolize, IF there is bleeding in abdominal cavity related to paracentesis then IR would need to embolize.  IF there is no active bleeding seen then monitor closely as angiogram may be "negative" with intermittent bleeding or a slow ooze <  0.5 ml /min.      LOS: 7 days   Wyline Mood, MD 07/24/2023, 11:17 AM

## 2023-07-24 NOTE — Assessment & Plan Note (Signed)
Likely from last paracentesis.

## 2023-07-24 NOTE — Plan of Care (Signed)

## 2023-07-24 NOTE — Progress Notes (Signed)
Pharmacy Antibiotic Note  Shane Thorman Sr. is a 37 y.o. male admitted on 07/17/2023 with  intra-abdominal .  Pharmacy has been consulted for Zosyn dosing.  Plan: Zosyn 3.375g IV q8h (4 hour infusion).  Height: 6\' 1"  (185.4 cm) Weight: 65.8 kg (145 lb 1 oz) IBW/kg (Calculated) : 79.9  Temp (24hrs), Avg:98.4 F (36.9 C), Min:98.3 F (36.8 C), Max:98.7 F (37.1 C)  Recent Labs  Lab 07/17/23 1408 07/17/23 2347 07/18/23 0138 07/18/23 0649 07/19/23 0452 07/21/23 0420 07/22/23 0431 07/24/23 0432  WBC 21.0*  --   --  25.4* 18.7* 8.0  --  14.8*  CREATININE 0.82  --   --  1.45* 1.19 0.58* 0.60* 0.65  LATICACIDVEN  --  3.1* 3.7* 3.0* 2.1*  --   --   --     Estimated Creatinine Clearance: 117.7 mL/min (by C-G formula based on SCr of 0.65 mg/dL).    Allergies  Allergen Reactions   Oxycodone Rash    Rash/itching which required benadryl to resolve   Sudafed [Pseudoephedrine]     History per pt of this being linked with his seizures    Antimicrobials this admission:   >>    >>   Dose adjustments this admission:   Microbiology results:  BCx:   UCx:    Sputum:    MRSA PCR:   Thank you for allowing pharmacy to be a part of this patient's care.  Promise Weldin D 07/24/2023 5:16 AM

## 2023-07-24 NOTE — ED Provider Notes (Signed)
Procedures     ----------------------------------------- 4:40 AM on 07/24/2023 ----------------------------------------- I responded to hospital overhead CODE BLUE announcement, arrived to patient's bedside.  Patient was awake on my arrival, breathing spontaneously with hospitalist covering NP at bedside.  Patient was not in distress, not requiring ACLS.  Continue care per hospitalist team.     Sharman Cheek, MD 07/24/23 [phone]

## 2023-07-24 NOTE — TOC Progression Note (Signed)
Transition of Care (TOC) - Progression Note    Patient Details  Name: Shane Jenkins Sr. MRN: [id number] Date of Birth: Nov 16, 1985  Transition of Care [facility]) CM/SW Contact  Truddie Hidden, RN Phone Number: 07/24/2023, 10:26 AM  Clinical Narrative:    TOC continues ongoing assessments for needs that may arise.         Expected Discharge Plan and Services                                               Social Determinants of Health (SDOH) Interventions SDOH Screenings   Food Insecurity: No Food Insecurity (07/17/2023)  Housing: Low Risk  (07/17/2023)  Transportation Needs: No Transportation Needs (07/17/2023)  Utilities: Not At Risk (07/17/2023)  Tobacco Use: Medium Risk (07/23/2023)   Received from [facility]    Readmission Risk Interventions     No data to display

## 2023-07-24 NOTE — Assessment & Plan Note (Addendum)
With hemoglobin 7.5 will transfuse 1 unit of packed red blood cells and recheck hemoglobin afterwards.

## 2023-07-24 NOTE — Progress Notes (Signed)
Progress Note   Patient: Shane Denno Sr. WUJ:[id number] DOB: 1986/02/09 DOA: 07/17/2023     7 DOS: the patient was seen and examined on 07/24/2023   Brief hospital course: Shane Pillado Sr. is a 37 y.o. male with medical history significant of prior history of alcoholism who actually quit a few months ago, tobacco abuse, prior history of pancreatitis who presents to the ER with abdominal pain and nausea.  Upon arriving the emergency room, her heart rate was 147, respiratory rate was 34, lab study significant leukocytosis of 21, lactic acid 3.7.  Patient also has significant abdominal tenderness with rebound tenderness.  CT abdomen/pelvis showed moderate to large ascites with peritoneal enhancement.  Small pancreatic pseudocyst. Patient was started on IV fluids, pain medicine.  Due to concern for peritonitis, patient was started on Rocephin.  9/8.  5.8 L drawn off with paracentesis 9/11.  400 mL drawn off with paracentesis.  Confirmed with [facility] that the plan is to transfer over to [facility] for procedure starting at 10 AM. 9/12.  Patient seen prior to transfer to [facility] for ERCP procedure. 9/13.  Patient had more abdominal pain overnight.  Had a CT scan.  Which showed hemoperitoneum likely from last paracentesis.  Patient was started on empiric antibiotic.  Patient was evaluated this morning by me.  Then had an episode of dark reddish maroonish blood.  Patient already on IV Protonix.  Case discussed with general surgery and gastroenterology.  CT angio negative for acute bleeding.  Continue to monitor hemoglobin.  Last hemoglobin 8.0 and a unit of blood was ordered.  Assessment and Plan: * Upper GI bleeding CT angio negative for active bleeding but does have hemoperitoneum.  Patient did have 1 episode of dark red blood.  Patient on IV Protonix.  Transfuse 1 unit of blood for hemoglobin of 8.0.  Acute blood loss anemia Transfuse 1 unit of blood for hemoglobin of 8.0.  Hemoperitoneum Likely from last  paracentesis.  Severe sepsis (HCC) Secondary to spontaneous bacterial peritonitis.  No growth out of culture.  Patient completed antibiotics with Rocephin but put on empiric Zosyn with worsening abdominal pain this morning  Spontaneous bacterial peritonitis (HCC) Completed course of Rocephin but placed on antibiotics with worsening abdominal pain this morning  Pancreatic pseudocyst Large pseudocyst seen on CT scan of the abdomen.  Patient had pancreatic stents placed by [facility] on 9/12.  Alcohol induced acute pancreatitis Restart full liquid diet and advance as tolerated  Hyponatremia Last sodium 134  Pancreatic ascites Patient 5.9 L drawn off on 9/8.  400 mL drawn off on 9/11.  Acute urinary retention Start Flomax and hopefully can take out Foley in a couple days.  Major depression On Celexa  Hypocalcemia Last calcium 7.7  Normal anion gap metabolic acidosis Improved  Reactive thrombocytosis Platelet count normalized  Hypomagnesemia Replaced  Protein-calorie malnutrition, severe Continue supplements  AKI (acute kidney injury) (HCC) Resolved        Subjective: Patient had worsening abdominal pain overnight received pain medications and CT scan showed hemoperitoneum.  This was likely from last paracentesis.  Patient then had an episode of bleeding dark red blood.  Patient already on Protonix.  CT angiogram negative for acute hemorrhage.  Physical Exam: Vitals:   07/24/23 0700 07/24/23 1100 07/24/23 1405 07/24/23 1422  BP: 117/81 (!) 126/93 115/72 116/71  Pulse: 90 (!) 106 85 82  Resp: 12 14 10  (!) 9  Temp:   98.4 F (36.9 C) 98.5 F (36.9 C)  TempSrc:  Oral   SpO2: 100% 98% 98%   Weight:      Height:       Physical Exam HENT:     Head: Normocephalic.     Mouth/Throat:     Pharynx: No oropharyngeal exudate.  Eyes:     General: Lids are normal.     Conjunctiva/sclera: Conjunctivae normal.  Cardiovascular:     Rate and Rhythm: Normal rate and regular  rhythm.     Heart sounds: Normal heart sounds, S1 normal and S2 normal.  Pulmonary:     Breath sounds: No decreased breath sounds, wheezing, rhonchi or rales.  Abdominal:     General: There is distension.     Palpations: Abdomen is soft.     Tenderness: There is generalized abdominal tenderness.  Musculoskeletal:     Right lower leg: No swelling.     Left lower leg: No swelling.  Skin:    General: Skin is warm.     Findings: No rash.  Neurological:     Mental Status: He is alert and oriented to person, place, and time.     Data Reviewed: Last hemoglobin 8.0, INR 1.1, creatinine 0.65, sodium 134  Family Communication: Spoke with wife on the phone this evening  Disposition: Status is: Inpatient Remains inpatient appropriate because: Need to monitor with bleeding.  Transfuse 1 unit of packed red blood cells.  Planned Discharge Destination: Home    Time spent: 35 minutes With drop in hemoglobin and transfusion and tachycardia earlier the patient is critically ill.  Transfusing 1 unit of blood.  Case discussed with general surgery and gastroenterology.  CT angio negative for acute bleeding but did show hemoperitoneum likely from last paracentesis.  Author: Alford Highland, MD 07/24/2023 4:10 PM  For on call review [email or web address].

## 2023-07-24 NOTE — Progress Notes (Addendum)
CROSS COVER NOTE  NAME: Shane Ciardullo Sr. MRN: [id number] DOB : [birth year]    Concern as stated by nurse / staff   Rapid response patient with syncopal even coming out of bathroom..     Pertinent findings on chart review: Admitted 9/6 with SIRS likely SBP and acute alcohol induce pancreatitis with pancreatic pseudocyst and pancreatic duct leak.  Cultures negative to date. Completed antibiotics for suspected SBP. Underwent  diagnostic ERCP at [facility] 9/12. Report reviewed in EPIC no complications reported   Also has had2 paracentesis during stay   Assessment and  Interventions   Assessment:    07/24/2023    4:22 AM 07/23/2023   11:38 PM 07/23/2023    7:41 PM  Vitals with BMI  Systolic 105 133 782  Diastolic 71 85 91  Pulse 112 90 99       Latest Ref Rng & Units 07/24/2023    4:32 AM 07/21/2023    4:20 AM 07/19/2023    4:52 AM  CBC  WBC 4.0 - 10.5 K/uL 14.8  8.0  18.7   Hemoglobin 13.0 - 17.0 g/dL 9.3  95.6  21.3   Hematocrit 39.0 - 52.0 % 27.3  31.3  43.3   Platelets 150 - 400 K/uL 378  245  345     Likely vagal with bloody bowel movement Very pale  Oriented x3; no focal deficits Abdomen distended Plan: SIRS criteria met - intraabdominal source suspected Zosyn per pharmacy dosing CT abdomen with contrast Blood urine cultures Trend lactate Type and screen 250 NS bolus Place on tele Bedrest  GI bleed Recheck hgb at noon Protonix IV changed to every 12 Follow up CT Keep NPO      Donnie Mesa NP [facility] Cross Cover 7pm-7am - check amion for availability Pager [phone]

## 2023-07-24 NOTE — Progress Notes (Addendum)
Per report pt report " I do not feel good". VSS except HR 120. Blood sugar at 158. NP Jon Billings made aware. Pt requested to walked to the bathroom to have a bowel movement. Pt was assisted by the staff to go to the bathroom but requested to be left alone. Pt was left alone in the bathroom for privacy while staff stayed outside bathroom door. Staff heard soft thud inside the bathroom. When staff open the bathroom door, pt pass out leaning towards the right side wall sitting in the toilet. Called blue activated. Pt was assisted back in the bed unresponsive. Pt ROSC returned after pt got in the bed. Code blue was cancelled. Pt has a a small dark colored stool. Pt refused to notify wife at this time.Incoming shift made aware. Will continue to monitor.  Marland KitchenUpdate:  See new orders ([id number]). Will continue to monitor

## 2023-07-24 NOTE — Plan of Care (Signed)
Problem: Education: Goal: Knowledge of General Education information will improve Description: Including pain rating scale, medication(s)/side effects and non-pharmacologic comfort measures Outcome: Progressing   Problem: Clinical Measurements: Goal: Will remain free from infection Outcome: Progressing   Problem: Clinical Measurements: Goal: Respiratory complications will improve Outcome: Progressing   Problem: Clinical Measurements: Goal: Cardiovascular complication will be avoided Outcome: Progressing   Problem: Nutrition: Goal: Adequate nutrition will be maintained Outcome: Progressing   Problem: Elimination: Goal: Will not experience complications related to bowel motility Outcome: Progressing   Problem: Elimination: Goal: Will not experience complications related to urinary retention Outcome: Progressing   Problem: Pain Managment: Goal: General experience of comfort will improve Outcome: Progressing

## 2023-07-24 NOTE — Assessment & Plan Note (Signed)
CT angio negative for active bleeding but does have hemoperitoneum.  Patient did have 1 episode of dark red blood.  Patient on IV Protonix.  Transfuse 1 unit of blood this morning with hemoglobin of 7.5

## 2023-07-25 DIAGNOSIS — D62 Acute posthemorrhagic anemia: Secondary | ICD-10-CM | POA: Diagnosis not present

## 2023-07-25 DIAGNOSIS — K852 Alcohol induced acute pancreatitis without necrosis or infection: Secondary | ICD-10-CM | POA: Diagnosis not present

## 2023-07-25 DIAGNOSIS — K661 Hemoperitoneum: Secondary | ICD-10-CM | POA: Diagnosis not present

## 2023-07-25 DIAGNOSIS — K922 Gastrointestinal hemorrhage, unspecified: Secondary | ICD-10-CM | POA: Diagnosis not present

## 2023-07-25 LAB — CBC
HCT: 22.8 % — ABNORMAL LOW (ref 39.0–52.0)
Hemoglobin: 7.5 g/dL — ABNORMAL LOW (ref 13.0–17.0)
MCH: 28.3 pg (ref 26.0–34.0)
MCHC: 32.9 g/dL (ref 30.0–36.0)
MCV: 86 fL (ref 80.0–100.0)
Platelets: 248 10*3/uL (ref 150–400)
RBC: 2.65 MIL/uL — ABNORMAL LOW (ref 4.22–5.81)
RDW: 14.8 % (ref 11.5–15.5)
WBC: 10.7 10*3/uL — ABNORMAL HIGH (ref 4.0–10.5)
nRBC: 0 % (ref 0.0–0.2)

## 2023-07-25 LAB — HEMOGLOBIN: Hemoglobin: 8.8 g/dL — ABNORMAL LOW (ref 13.0–17.0)

## 2023-07-25 LAB — PREPARE RBC (CROSSMATCH)

## 2023-07-25 MED ORDER — ACETAMINOPHEN 325 MG PO TABS
650.0000 mg | ORAL_TABLET | Freq: Once | ORAL | Status: AC
  Administered 2023-07-25: 650 mg via ORAL
  Filled 2023-07-25: qty 2

## 2023-07-25 MED ORDER — SODIUM CHLORIDE 0.9% IV SOLUTION: Freq: Once | INTRAVENOUS | Status: AC

## 2023-07-25 NOTE — Plan of Care (Signed)

## 2023-07-25 NOTE — Progress Notes (Signed)
Progress Note   Patient: Shane Edelman Sr. ZOX:[id number] DOB: 04-Apr-1986 DOA: 07/17/2023     8 DOS: the patient was seen and examined on 07/25/2023   Brief hospital course: Kurtis Cord Sr. is a 37 y.o. male with medical history significant of prior history of alcoholism who actually quit a few months ago, tobacco abuse, prior history of pancreatitis who presents to the ER with abdominal pain and nausea.  Upon arriving the emergency room, her heart rate was 147, respiratory rate was 34, lab study significant leukocytosis of 21, lactic acid 3.7.  Patient also has significant abdominal tenderness with rebound tenderness.  CT abdomen/pelvis showed moderate to large ascites with peritoneal enhancement.  Small pancreatic pseudocyst. Patient was started on IV fluids, pain medicine.  Due to concern for peritonitis, patient was started on Rocephin.  9/8.  5.8 L drawn off with paracentesis 9/11.  400 mL drawn off with paracentesis.  Confirmed with [facility] that the plan is to transfer over to [facility] for procedure starting at 10 AM. 9/12.  Patient seen prior to transfer to [facility] for ERCP procedure. 9/13.  Patient had more abdominal pain overnight.  Had a CT scan.  Which showed hemoperitoneum likely from last paracentesis.  Patient was started on empiric antibiotic.  Patient was evaluated this morning by me.  Then had an episode of dark reddish maroonish blood.  Patient already on IV Protonix.  Case discussed with general surgery and gastroenterology.  CT angio negative for acute bleeding.  Continue to monitor hemoglobin.  Last hemoglobin 8.0 and a unit of blood was ordered. 9/14.  Patient has not had a bloody bowel movement since yesterday.  Hemoglobin down to 7.5.  Will transfuse another unit of blood today.  Assessment and Plan: * Upper GI bleeding CT angio negative for active bleeding but does have hemoperitoneum.  Patient did have 1 episode of dark red blood.  Patient on IV Protonix.  Transfuse 1 unit of blood  this morning with hemoglobin of 7.5  Acute blood loss anemia With hemoglobin 7.5 will transfuse 1 unit of packed red blood cells and recheck hemoglobin afterwards.  Hemoperitoneum Likely from last paracentesis.  Severe sepsis (HCC) Secondary to spontaneous bacterial peritonitis.  No growth out of culture.  Patient completed antibiotics with Rocephin but put on empiric Zosyn with worsening abdominal pain yesterday  Spontaneous bacterial peritonitis (HCC) Completed course of Rocephin but placed on Zosyn with worsening abdominal pain.  Pancreatic pseudocyst Large pseudocyst seen on CT scan of the abdomen.  Patient had pancreatic stent placed by [facility] on 9/12.  Alcohol induced acute pancreatitis Try to advance to soft diet today  Hyponatremia Last sodium 134  Pancreatic ascites Patient 5.9 L drawn off on 9/8.  400 mL drawn off on 9/11.  Acute urinary retention Start Flomax and hopefully can take out Foley potentially tomorrow.  Major depression On Celexa  Hypocalcemia Last calcium 7.8  Normal anion gap metabolic acidosis Improved  Reactive thrombocytosis Platelet count normalized  Hypomagnesemia Replaced  Protein-calorie malnutrition, severe Continue supplements  AKI (acute kidney injury) (HCC) Resolved        Subjective: Patient feels okay.  Has chronic abdominal pain at home that he takes Tylenol for.  Here having a little more abdominal pain than usual.  Feels okay.  Wants to try to advance diet.  No further bleeding episodes since yesterday.  Physical Exam: Vitals:   07/25/23 0800 07/25/23 0901 07/25/23 1032 07/25/23 1309  BP: 124/64  120/70 119/76  Pulse: 83  91   Resp: 10  16   Temp:  98.6 F (37 C) 98.5 F (36.9 C) 98.5 F (36.9 C)  TempSrc:  Oral Oral Oral  SpO2: 100%  97%   Weight:      Height:       Physical Exam HENT:     Head: Normocephalic.     Mouth/Throat:     Pharynx: No oropharyngeal exudate.  Eyes:     General: Lids are normal.      Conjunctiva/sclera: Conjunctivae normal.  Cardiovascular:     Rate and Rhythm: Normal rate and regular rhythm.     Heart sounds: Normal heart sounds, S1 normal and S2 normal.  Pulmonary:     Breath sounds: No decreased breath sounds, wheezing, rhonchi or rales.  Abdominal:     General: There is distension.     Palpations: Abdomen is soft.     Tenderness: There is generalized abdominal tenderness.  Musculoskeletal:     Right lower leg: No swelling.     Left lower leg: No swelling.  Skin:    General: Skin is warm.     Findings: No rash.  Neurological:     Mental Status: He is alert and oriented to person, place, and time.     Data Reviewed: Hemoglobin 7.5, white blood count 10.7  Family Communication: Spoke with wife at bedside  Disposition: Status is: Inpatient Remains inpatient appropriate because: Transfusing a unit of blood today.  Planned Discharge Destination: Home    Time spent: 27 minutes  Author: Alford Highland, MD 07/25/2023 1:32 PM  For on call review [email or web address].

## 2023-07-25 NOTE — Progress Notes (Signed)
GI Inpatient Follow-up Note  Subjective:  Patient seen in follow-up for Dr. 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 No acute events overnight. No further episodes of bloody stool this morning. He denies any fevers, chills, or altered mental status. He does endorse 9/10 abdominal pain across his entire abdomen. He has been receiving Dilaudid. CTA yesterday negative for acute GI bleed. Hemoglobin dropped to 7.5 this morning (9.1 yesterday) and he is receiving transfusion at time of my encounter. No new complaints at this time.   Scheduled Inpatient Medications:   (feeding supplement) PROSource Plus  30 mL Oral TID BM   Chlorhexidine Gluconate Cloth  6 each Topical Daily   citalopram  20 mg Oral Daily   feeding supplement  1 Container Oral TID BM   lipase/protease/amylase  72,000 Units Oral TID WC   multivitamin with minerals  1 tablet Oral Daily   octreotide  200 mcg Subcutaneous Q12H   pantoprazole (PROTONIX) IV  40 mg Intravenous Q12H   tamsulosin  0.4 mg Oral QPC supper    Continuous Inpatient Infusions:    piperacillin-tazobactam (ZOSYN)  IV 3.375 g (07/25/23 0515)    PRN Inpatient Medications:  HYDROcodone-acetaminophen, HYDROmorphone (DILAUDID) injection, metoprolol tartrate, ondansetron **OR** ondansetron (ZOFRAN) IV  Review of Systems: Constitutional: Weight is stable.  Eyes: No changes in vision. ENT: No oral lesions, sore throat.  GI: see HPI.  Heme/Lymph: No easy bruising.  CV: No chest pain.  GU: No hematuria.  Integumentary: No rashes.  Neuro: No headaches.  Psych: No depression/anxiety.  Endocrine: No heat/cold intolerance.  Allergic/Immunologic: No urticaria.  Resp: No cough, SOB.  Musculoskeletal: No joint swelling.    Physical Examination: BP 120/70 (BP Location: Left Arm)   Pulse 91   Temp  98.5 F (36.9 C) (Oral)   Resp 16   Ht 6\' 1"  (1.854 m)   Wt 65.8 kg   SpO2 97%   BMI 19.14 kg/m  Gen: NAD, alert and oriented x 4 HEENT: PEERLA, EOMI, Neck: supple, no JVD or thyromegaly Chest: CTA bilaterally, no wheezes, crackles, or other adventitious sounds CV: RRR, no m/g/c/r Abd: firm abdomen, +BS in all four quadrants; tender to deep palpation diffusely across abdomen, no HSM, guarding, ridigity, or rebound tenderness Ext: no edema, well perfused with 2+ pulses, Skin: no rash or lesions noted Lymph: no LAD  Data: Lab Results  Component Value Date   WBC 10.7 (H) 07/25/2023   HGB 7.5 (L) 07/25/2023   HCT 22.8 (L) 07/25/2023   MCV 86.0 07/25/2023   PLT 248 07/25/2023   Recent Labs  Lab 07/24/23 1129 07/24/23 1816 07/25/23 0613  HGB 8.0* 9.1* 7.5*   Lab Results  Component Value Date   NA 134 (L) 07/24/2023   K 4.2 07/24/2023   CL 97 (L) 07/24/2023   CO2 26 07/24/2023   BUN 7 07/24/2023   CREATININE 0.65 07/24/2023   Lab Results  Component Value Date   ALT 7 07/24/2023   AST 12 (L) 07/24/2023   ALKPHOS 54 07/24/2023   BILITOT 0.4 07/24/2023   Recent Labs  Lab 07/24/23 1129  INR 1.1    Assessment/Plan:  37 y/o Caucasian male with a PMH of hx of alcoholic pancreatitis resulting in chronic pancreatitis, hx of EtOH abuse reportedly in remission x 7 months per patient's report, tobacco abuse, and HTN admitted 9/6 for  chief complaint of diffuse abdominal pain and nausea found to have alcoholic pancreatitis resulting in chronic pancreatitis with recurrent pancreatic ascites s/p LVP treated for SBP s/p ERCP at [facility] 9/12 with stent placement. Dr. Norma Fredrickson and myself following over the weekend for Dr. Tobi Bastos.   GI bleeding - unclear etiology, possible 2/2 ERCP site versus hemoperitoneum versus intra-abdominal bleeding. Hemoglobin dropped to 7.5 this morning from 9.1 yesterday evening. This could be 2/2 hemodilution versus blood loss.   Alcoholic pancreatitis with  chronic pancreatitis c/b SBP, recurrent pancreatic ascites s/p ERCP at [facility] with stent placement 9/12   Hyponatremia - chronic, likely 2/2 EtOH abuse  Alcohol use disorder - reportedly sober x 7 months  Severe protein-calorie malnutrition   Recommendations:  - Maintain 2 large bore IVs for access - Continue to monitor serial H&H. Transfuse for Hgb <8.0.  - Transfuse 1 unit pRBCs as ordered today and re-check Hgb 2 hours after transfusion  - No signs of overt GI bleeding at this time. CTA yesterday negative. He may have had some bleeding after ERCP based on description - unclear if blood seen in stool is the blood loss related to ERCP on 9/12 vs fresh. There may be some blood loss in the abdomen too based on imaging related to paracentesis. - No indications for any GI/IR interventions at this time. If evidence of overt bleeding, would need to be transferred back to [facility] for hemostasis with side viewing scope if IR here at [facility] cannot embolize. CTA may be "negative" with intermittent bleeding or a slow ooze <  0.5 mL /min.  - Continue to monitor for signs of GI bleeding - Continue supportive care with pain control, antiemetics, and IV fluids per primary team - Discussed importance of continued alcohol cessation  - GI following along with you    Please call with questions or concerns.   Jacob Moores, PA-C [facility] [phone]

## 2023-07-26 DIAGNOSIS — K922 Gastrointestinal hemorrhage, unspecified: Secondary | ICD-10-CM | POA: Diagnosis not present

## 2023-07-26 DIAGNOSIS — D62 Acute posthemorrhagic anemia: Secondary | ICD-10-CM | POA: Diagnosis not present

## 2023-07-26 DIAGNOSIS — K661 Hemoperitoneum: Secondary | ICD-10-CM | POA: Diagnosis not present

## 2023-07-26 DIAGNOSIS — A419 Sepsis, unspecified organism: Secondary | ICD-10-CM | POA: Diagnosis not present

## 2023-07-26 LAB — BPAM RBC
Blood Product Expiration Date: 202410072359
Blood Product Expiration Date: 202410072359
ISSUE DATE / TIME: 202409131357
ISSUE DATE / TIME: 202409141040
Unit Type and Rh: 6200
Unit Type and Rh: 6200

## 2023-07-26 LAB — TYPE AND SCREEN
ABO/RH(D): A POS
Antibody Screen: NEGATIVE
Unit division: 0
Unit division: 0

## 2023-07-26 LAB — CBC
HCT: 24.6 % — ABNORMAL LOW (ref 39.0–52.0)
Hemoglobin: 8.3 g/dL — ABNORMAL LOW (ref 13.0–17.0)
MCH: 28.3 pg (ref 26.0–34.0)
MCHC: 33.7 g/dL (ref 30.0–36.0)
MCV: 84 fL (ref 80.0–100.0)
Platelets: 256 10*3/uL (ref 150–400)
RBC: 2.93 MIL/uL — ABNORMAL LOW (ref 4.22–5.81)
RDW: 15.1 % (ref 11.5–15.5)
WBC: 7.8 10*3/uL (ref 4.0–10.5)
nRBC: 0 % (ref 0.0–0.2)

## 2023-07-26 LAB — BASIC METABOLIC PANEL
Anion gap: 7 (ref 5–15)
BUN: 11 mg/dL (ref 6–20)
CO2: 29 mmol/L (ref 22–32)
Calcium: 7.5 mg/dL — ABNORMAL LOW (ref 8.9–10.3)
Chloride: 99 mmol/L (ref 98–111)
Creatinine, Ser: 0.8 mg/dL (ref 0.61–1.24)
GFR, Estimated: >60 mL/min (ref >60)
Glucose, Bld: 101 mg/dL — ABNORMAL HIGH (ref 70–99)
Potassium: 4 mmol/L (ref 3.5–5.1)
Sodium: 135 mmol/L (ref 135–145)

## 2023-07-26 MED ORDER — SIMETHICONE 80 MG PO CHEW
80.0000 mg | CHEWABLE_TABLET | Freq: Four times a day (QID) | ORAL | Status: DC | PRN
  Administered 2023-07-26: 80 mg via ORAL
  Filled 2023-07-26: qty 1

## 2023-07-26 NOTE — Progress Notes (Signed)
[facility] Inpatient Progress Note    Subjective: Patient seen for f/u pancreatic ascites, abdominal pain on IV opioids, ?GI bleed s/p ERCP on 07/23/23 @ [facility] with pancreatic sphincterotomy. Patient denies any recurrent bleeding. No BM.  Objective: Vital signs in last 24 hours: Temp:  [97.5 F (36.4 C)-98.9 F (37.2 C)] 98 F (36.7 C) (09/15 0700) Pulse Rate:  [71-80] 77 (09/15 0400) Resp:  [10-15] 15 (09/15 0400) BP: (119-129)/(74-84) 120/76 (09/15 0400) SpO2:  [95 %-97 %] 95 % (09/15 0400) Weight:  [72.2 kg] 72.2 kg (09/15 0425) Blood pressure 120/76, pulse 77, temperature 98 F (36.7 C), temperature source Oral, resp. rate 15, height 6\' 1"  (1.854 m), weight 72.2 kg, SpO2 95%.    Intake/Output from previous day: 09/14 0701 - 09/15 0700 In: 1110 [P.O.:600; Blood:360; IV Piggyback:150] Out: 950 [Urine:950]  Intake/Output this shift: No intake/output data recorded.   Gen: NAD. Appears somnolent, though oriented x 3.  HEENT: [place]/AT. PERRLA. Normal external ear exam.  Chest: CTA, no wheezes.  CV: RR nl S1, S2. No gallops.  Abd: soft, tender in upper abd, distended. BS+  Ext: no edema. Pulses 2+  Neuro: Somnolent but oriented. Judgement appears normal. Nonfocal.   Lab Results: Results for orders placed or performed during the hospital encounter of 07/17/23 (from the past 24 hour(s))  Hemoglobin     Status: Abnormal   Collection Time: 07/25/23  2:19 PM  Result Value Ref Range   Hemoglobin 8.8 (L) 13.0 - 17.0 g/dL  CBC     Status: Abnormal   Collection Time: 07/26/23  4:35 AM  Result Value Ref Range   WBC 7.8 4.0 - 10.5 K/uL   RBC 2.93 (L) 4.22 - 5.81 MIL/uL   Hemoglobin 8.3 (L) 13.0 - 17.0 g/dL   HCT 16.1 (L) 09.6 - 04.5 %   MCV 84.0 80.0 - 100.0 fL   MCH 28.3 26.0 - 34.0 pg   MCHC 33.7 30.0 - 36.0 g/dL   RDW 40.9 81.1 - 91.4 %   Platelets 256 150 - 400 K/uL   nRBC 0.0 0.0 - 0.2 %  Basic metabolic panel     Status: Abnormal   Collection  Time: 07/26/23  4:35 AM  Result Value Ref Range   Sodium 135 135 - 145 mmol/L   Potassium 4.0 3.5 - 5.1 mmol/L   Chloride 99 98 - 111 mmol/L   CO2 29 22 - 32 mmol/L   Glucose, Bld 101 (H) 70 - 99 mg/dL   BUN 11 6 - 20 mg/dL   Creatinine, Ser 7.82 0.61 - 1.24 mg/dL   Calcium 7.5 (L) 8.9 - 10.3 mg/dL   GFR, Estimated >95 >62 mL/min   Anion gap 7 5 - 15     Recent Labs    07/24/23 0432 07/24/23 1129 07/25/23 0613 07/25/23 1419 07/26/23 0435  WBC 14.8*  --  10.7*  --  7.8  HGB 9.3*   < > 7.5* 8.8* 8.3*  HCT 27.3*  --  22.8*  --  24.6*  PLT 378  --  248  --  256   < > = values in this interval not displayed.   BMET Recent Labs    07/24/23 0432 07/26/23 0435  NA 134* 135  K 4.2 4.0  CL 97* 99  CO2 26 29  GLUCOSE 162* 101*  BUN 7 11  CREATININE 0.65 0.80  CALCIUM 7.8* 7.5*   LFT Recent Labs    07/24/23 0432  PROT 4.6*  ALBUMIN 1.8*  AST 12*  ALT 7  ALKPHOS 54  BILITOT 0.4   PT/INR Recent Labs    07/24/23 1129  LABPROT 14.6  INR 1.1   Hepatitis Panel No results for input(s): "HEPBSAG", "HCVAB", "HEPAIGM", "HEPBIGM" in the last 72 hours. C-Diff No results for input(s): "CDIFFTOX" in the last 72 hours. No results for input(s): "CDIFFPCR" in the last 72 hours.   Studies/Results: CT ANGIO GI BLEED  Result Date: 07/24/2023 CLINICAL DATA:  Abdominal pain, concern for GI bleed EXAM: CTA ABDOMEN AND PELVIS WITHOUT AND WITH CONTRAST TECHNIQUE: Multidetector CT imaging of the abdomen and pelvis was performed using the standard protocol during bolus administration of intravenous contrast. Multiplanar reconstructed images and MIPs were obtained and reviewed to evaluate the vascular anatomy. RADIATION DOSE REDUCTION: This exam was performed according to the departmental dose-optimization program which includes automated exposure control, adjustment of the mA and/or kV according to patient size and/or use of iterative reconstruction technique. CONTRAST:  OMNIPAQUE  IOHEXOL 350 MG/ML SOLN COMPARISON:  None Available. FINDINGS: Lower chest: Small bilateral pleural effusions and atelectasis. Small pericardial effusion. Hepatobiliary: No focal liver abnormality is seen. Distended gallbladder containing hyperdense material, likely vicarious excretion of contrast. No biliary ductal dilation. Pancreas: Numerous cystic lesions of the pancreas, largest is in the uncinate process and measures 1.8 cm on series 16, image 95. Small locule of gas seen within the pancreatic duct on series 16, image 82, unchanged when compared with the prior. Prominent irregular main pancreatic duct, unchanged. Spleen: Normal in size without focal abnormality. Adrenals/Urinary Tract: Bilateral adrenal glands are unremarkable. No hydronephrosis or nephrolithiasis. Mildly thick-walled urinary bladder which is decompressed and contains a Foley catheter. Stomach/Bowel: Stomach is within normal limits. Appendix appears normal. No evidence of bowel wall thickening, distention, or inflammatory changes. Vascular and lymphatic: No evidence of active arterial extravasation. Normal caliber thoracic aorta with no significant atherosclerotic disease. No significant vascular findings are present. No enlarged abdominal or pelvic lymph nodes. Reproductive: Prostate is unremarkable. Other: Large volume abdominopelvic ascites with hyperdense material seen in the left anterior pelvis, unchanged when compared with the prior exam. Unchanged loculated fluid collections located along the proximal stomach and caudate lobe. Small bilateral fat containing inguinal hernias. Musculoskeletal: Redemonstrated asymmetric swelling of the left rectus abdominus. No aggressive appearing osseous lesions. IMPRESSION: 1. No evidence of active arterial extravasation. 2. Large volume abdominopelvic ascites with hyperdense material seen in the left anterior pelvis, unchanged when compared with the prior exam and consistent with hemoperitoneum. 3.  Redemonstrated asymmetric swelling of the left rectus abdominus, consistent with small rectus sheath hematoma. 4. Redemonstrated small intraductal locule gas of the pancreatic neck, correlate for history of recent instrumentation. 5. Numerous cystic lesions of the pancreas, likely pseudocysts given history of acute pancreatitis. 6. Small bilateral pleural effusions and small pericardial effusion. Electronically Signed   By: Allegra Lai M.D.   On: 07/24/2023 15:42    Scheduled Inpatient Medications:    (feeding supplement) PROSource Plus  30 mL Oral TID BM   Chlorhexidine Gluconate Cloth  6 each Topical Daily   citalopram  20 mg Oral Daily   feeding supplement  1 Container Oral TID BM   lipase/protease/amylase  72,000 Units Oral TID WC   multivitamin with minerals  1 tablet Oral Daily   octreotide  200 mcg Subcutaneous Q12H   pantoprazole (PROTONIX) IV  40 mg Intravenous Q12H   tamsulosin  0.4 mg Oral QPC supper    Continuous Inpatient Infusions:  PRN Inpatient Medications:  HYDROcodone-acetaminophen, HYDROmorphone (DILAUDID) injection, metoprolol tartrate, ondansetron **OR** ondansetron (ZOFRAN) IV  Miscellaneous:   Assessment:  1/ 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 2. ?Hematochezia. Not apparent on serial exam.CTA negative for active bleeding. 3. 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 4. Hyponatremia - Na corrected now to 135. Possibly related to beer potomania. 5. Malnutrition. 6. Anemia s/p GI blood loss - s/p 1 u prbc with increase in Hgb to 8.8 yesterday, now 8.3 today  Plan:  Continue current management. No new recommendations at this time. As no bleeding, will sign off for now. Dr. Tobi Bastos will re-consult if needed for any recurrent bleeding.No indications for any GI/IR interventions at this time. If evidence of overt bleeding, would need to be transferred back to [facility] for hemostasis with side viewing scope if IR here at  [facility] cannot embolize.   Maykel Reitter K. Norma Fredrickson, M.D. 07/26/2023, 11:28 AM

## 2023-07-26 NOTE — Progress Notes (Signed)
Progress Note   Patient: Shane Jenkins ZOX:[id number] DOB: 1986-10-14 DOA: 07/17/2023     9 DOS: the patient was seen and examined on 07/26/2023   Brief hospital course: Shane Waychoff Sr. is a 37 y.o. male with medical history significant of prior history of alcoholism who actually quit a few months ago, tobacco abuse, prior history of pancreatitis who presents to the ER with abdominal pain and nausea.  Upon arriving the emergency room, her heart rate was 147, respiratory rate was 34, lab study significant leukocytosis of 21, lactic acid 3.7.  Patient also has significant abdominal tenderness with rebound tenderness.  CT abdomen/pelvis showed moderate to large ascites with peritoneal enhancement.  Small pancreatic pseudocyst. Patient was started on IV fluids, pain medicine.  Due to concern for peritonitis, patient was started on Rocephin.  9/8.  5.8 L drawn off with paracentesis 9/11.  400 mL drawn off with paracentesis.  Confirmed with [facility] that the plan is to transfer over to [facility] for procedure starting at 10 AM. 9/12.  Patient seen prior to transfer to [facility] for ERCP procedure. 9/13.  Patient had more abdominal pain overnight.  Had a CT scan.  Which showed hemoperitoneum likely from last paracentesis.  Patient was started on empiric antibiotic.  Patient was evaluated this morning by me.  Then had an episode of dark reddish maroonish blood.  Patient already on IV Protonix.  Case discussed with general surgery and gastroenterology.  CT angio negative for acute bleeding.  Continue to monitor hemoglobin.  Last hemoglobin 8.0 and a unit of blood was ordered. 9/14.  Patient has not had a bloody bowel movement since yesterday.  Hemoglobin down to 7.5.  Will transfuse another unit of blood today. 9/15.  Patient's hemoglobin 8.3 this morning.  Continue to monitor closely.  Assessment and Plan: * Upper GI bleeding CT angio negative for active bleeding but does have hemoperitoneum.  Patient did have 1  episode of dark red blood 2 days ago.  Patient on IV Protonix.  This morning's hemoglobin 8.3.  Acute blood loss anemia Received 2 units of packed red blood cells while here.  Today's hemoglobin 8.3.  Hemoperitoneum Likely from last paracentesis.  Severe sepsis (HCC) Secondary to spontaneous bacterial peritonitis.  No growth out of culture.  Patient completed antibiotics with Rocephin but put on empiric Zosyn with worsening abdominal pain the other day.  Will discontinue Zosyn.  Spontaneous bacterial peritonitis (HCC) Completed course of Rocephin but placed on Zosyn with worsening abdominal pain.  Will discontinue Zosyn.  Pancreatic pseudocyst Large pseudocyst seen on CT scan of the abdomen.  Patient had pancreatic stent placed by [facility] on 9/12.  Alcohol induced acute pancreatitis Continue soft diet  Hyponatremia Last sodium normal range.  Pancreatic ascites Patient 5.9 L drawn off on 9/8.  400 mL drawn off on 9/11.  Acute urinary retention Start Flomax and hopefully can take out Foley potentially tomorrow.  Major depression On Celexa  Hypocalcemia Last calcium 7.5  Normal anion gap metabolic acidosis Improved  Reactive thrombocytosis Platelet count normalized  Hypomagnesemia Replaced  Protein-calorie malnutrition, severe Continue supplements  AKI (acute kidney injury) (HCC) Resolved        Subjective: Patient still has some abdominal pain.  Has chronic pain at home.  As of this morning did not have any further bleeding.  Hemoglobin this morning 8.3.  Ate a little bit last night.  Physical Exam: Vitals:   07/26/23 0400 07/26/23 0425 07/26/23 0700 07/26/23 1257  BP: 120/76  Pulse: 77     Resp: 15     Temp:   98 F (36.7 C) 97.6 F (36.4 C)  TempSrc:   Oral Oral  SpO2: 95%     Weight:  72.2 kg    Height:       Physical Exam HENT:     Head: Normocephalic.     Mouth/Throat:     Pharynx: No oropharyngeal exudate.  Eyes:     General: Lids are  normal.     Conjunctiva/sclera: Conjunctivae normal.  Cardiovascular:     Rate and Rhythm: Normal rate and regular rhythm.     Heart sounds: Normal heart sounds, S1 normal and S2 normal.  Pulmonary:     Breath sounds: No decreased breath sounds, wheezing, rhonchi or rales.  Abdominal:     General: There is distension.     Palpations: Abdomen is soft.     Tenderness: There is generalized abdominal tenderness.  Musculoskeletal:     Right lower leg: No swelling.     Left lower leg: No swelling.  Skin:    General: Skin is warm.     Findings: No rash.  Neurological:     Mental Status: He is alert and oriented to person, place, and time.     Data Reviewed: Hemoglobin 8.3, creatinine 0.80  Family Communication: Left message for patient's wife  Disposition: Status is: Inpatient Remains inpatient appropriate because: Will try to take out Foley this evening.  Continue pain control.  Continue watching hemoglobin.  Planned Discharge Destination: Home    Time spent: 28 minutes  Author: Alford Highland, MD 07/26/2023 1:58 PM  For on call review [email or web address].

## 2023-07-27 DIAGNOSIS — D62 Acute posthemorrhagic anemia: Secondary | ICD-10-CM | POA: Diagnosis not present

## 2023-07-27 DIAGNOSIS — A419 Sepsis, unspecified organism: Secondary | ICD-10-CM | POA: Diagnosis not present

## 2023-07-27 DIAGNOSIS — K922 Gastrointestinal hemorrhage, unspecified: Secondary | ICD-10-CM | POA: Diagnosis not present

## 2023-07-27 DIAGNOSIS — K661 Hemoperitoneum: Secondary | ICD-10-CM | POA: Diagnosis not present

## 2023-07-27 LAB — CBC
HCT: 26.2 % — ABNORMAL LOW (ref 39.0–52.0)
Hemoglobin: 8.7 g/dL — ABNORMAL LOW (ref 13.0–17.0)
MCH: 28.3 pg (ref 26.0–34.0)
MCHC: 33.2 g/dL (ref 30.0–36.0)
MCV: 85.3 fL (ref 80.0–100.0)
Platelets: 295 10*3/uL (ref 150–400)
RBC: 3.07 MIL/uL — ABNORMAL LOW (ref 4.22–5.81)
RDW: 14.8 % (ref 11.5–15.5)
WBC: 8.2 10*3/uL (ref 4.0–10.5)
nRBC: 0 % (ref 0.0–0.2)

## 2023-07-27 MED ORDER — HYDROMORPHONE HCL 1 MG/ML IJ SOLN: 0.5000 mg | Freq: Four times a day (QID) | INTRAMUSCULAR | Status: DC | PRN

## 2023-07-27 MED ORDER — HYDROCODONE-ACETAMINOPHEN 7.5-325 MG PO TABS: 1.0000 | ORAL_TABLET | ORAL | Status: DC | PRN

## 2023-07-27 MED ORDER — ACETAMINOPHEN 325 MG PO TABS: 650.0000 mg | ORAL_TABLET | Freq: Three times a day (TID) | ORAL | Status: DC | PRN

## 2023-07-27 MED ORDER — PANTOPRAZOLE SODIUM 40 MG PO TBEC
40.0000 mg | DELAYED_RELEASE_TABLET | Freq: Two times a day (BID) | ORAL | Status: DC
  Administered 2023-07-27 – 2023-07-28 (×2): 40 mg via ORAL
  Filled 2023-07-27 (×2): qty 1

## 2023-07-27 MED ORDER — ENSURE ENLIVE PO LIQD
237.0000 mL | Freq: Three times a day (TID) | ORAL | Status: DC
  Administered 2023-07-27 – 2023-07-28 (×3): 237 mL via ORAL

## 2023-07-27 MED ORDER — HYDROCODONE-ACETAMINOPHEN 10-325 MG PO TABS
1.0000 | ORAL_TABLET | ORAL | Status: DC | PRN
  Administered 2023-07-27 – 2023-07-28 (×5): 1 via ORAL
  Filled 2023-07-27 (×5): qty 1

## 2023-07-27 NOTE — Progress Notes (Signed)
Progress Note   Patient: Shane Zapien Sr. BJY:[id number] DOB: 1986/03/22 DOA: 07/17/2023     10 DOS: the patient was seen and examined on 07/27/2023   Brief hospital course: Dorothy Stanard Sr. is a 37 y.o. male with medical history significant of prior history of alcoholism who actually quit a few months ago, tobacco abuse, prior history of pancreatitis who presents to the ER with abdominal pain and nausea.  Upon arriving the emergency room, her heart rate was 147, respiratory rate was 34, lab study significant leukocytosis of 21, lactic acid 3.7.  Patient also has significant abdominal tenderness with rebound tenderness.  CT abdomen/pelvis showed moderate to large ascites with peritoneal enhancement.  Small pancreatic pseudocyst. Patient was started on IV fluids, pain medicine.  Due to concern for peritonitis, patient was started on Rocephin.  9/8.  5.8 L drawn off with paracentesis 9/11.  400 mL drawn off with paracentesis.  Confirmed with [facility] that the plan is to transfer over to [facility] for procedure starting at 10 AM. 9/12.  Patient seen prior to transfer to [facility] for ERCP procedure. 9/13.  Patient had more abdominal pain overnight.  Had a CT scan.  Which showed hemoperitoneum likely from last paracentesis.  Patient was started on empiric antibiotic.  Patient was evaluated this morning by me.  Then had an episode of dark reddish maroonish blood.  Patient already on IV Protonix.  Case discussed with general surgery and gastroenterology.  CT angio negative for acute bleeding.  Continue to monitor hemoglobin.  Last hemoglobin 8.0 and a unit of blood was ordered. 9/14.  Patient has not had a bloody bowel movement since yesterday.  Hemoglobin down to 7.5.  Will transfuse another unit of blood today. 9/15.  Patient's hemoglobin 8.3 this morning.  Continue to monitor closely. 9/16.  Patient still having a lot of soreness in his abdomen.  Received IV pain medication this morning.  Advised in order to go home we  need to be off the IV pain medication.  Hemoglobin up to 8.7 today.  Assessment and Plan: * Acute blood loss anemia Received 2 units of packed red blood cells while here.  Today's hemoglobin 8.7.  Commendation of hemoperitoneum from last paracentesis and GI bleed.  Upper GI bleeding CT angio negative for active bleeding but does have hemoperitoneum.  Patient did have 1 episode of dark red blood 3 days ago.  Patient on IV Protonix.  This morning's hemoglobin 8.7.  Hemoperitoneum Likely from last paracentesis.  Severe sepsis (HCC) Secondary to spontaneous bacterial peritonitis.  No growth out of culture.  Patient completed antibiotics with Rocephin but put on empiric Zosyn with worsening abdominal pain the other day.  Discontinue Zosyn on 9/15.  Spontaneous bacterial peritonitis (HCC) Completed course of Rocephin but placed on Zosyn with worsening abdominal pain.  Zosyn discontinued on 9/15.  Pancreatic pseudocyst Large pseudocyst seen on CT scan of the abdomen.  Patient had pancreatic stent placed by [facility] on 9/12.  Alcohol induced acute pancreatitis Continue soft diet  Hyponatremia Last sodium normal range.  Pancreatic ascites Patient 5.9 L drawn off on 9/8.  400 mL drawn off on 9/11 with hemoperitoneum afterwards.  Acute urinary retention Continue Flomax.  Urinated after Foley removal.  Major depression On Celexa  Hypocalcemia Last calcium 7.5  Normal anion gap metabolic acidosis Improved  Reactive thrombocytosis Platelet count normalized  Hypomagnesemia Replaced  Protein-calorie malnutrition, severe Continue supplements  AKI (acute kidney injury) (HCC) Resolved        Subjective:  Patient initially this morning was nervous about going home and required IV pain medications this morning after I saw him.  I spoke with him about coming off the IV pain medications in order to be on oral pain medications for discharge.  Admitted with pancreatic ascites.  Physical  Exam: Vitals:   07/27/23 0350 07/27/23 0811 07/27/23 1224 07/27/23 1422  BP:  123/80 123/80 115/76  Pulse:   95 76  Resp:   16 18  Temp: 98.4 F (36.9 C) 98 F (36.7 C) 98.6 F (37 C) 98.6 F (37 C)  TempSrc: Oral Oral Oral Oral  SpO2:  96% 95% 93%  Weight:      Height:       Physical Exam HENT:     Head: Normocephalic.     Mouth/Throat:     Pharynx: No oropharyngeal exudate.  Eyes:     General: Lids are normal.     Conjunctiva/sclera: Conjunctivae normal.  Cardiovascular:     Rate and Rhythm: Normal rate and regular rhythm.     Heart sounds: Normal heart sounds, S1 normal and S2 normal.  Pulmonary:     Breath sounds: No decreased breath sounds, wheezing, rhonchi or rales.  Abdominal:     General: There is distension.     Palpations: Abdomen is soft.     Tenderness: There is generalized abdominal tenderness.  Musculoskeletal:     Right lower leg: No swelling.     Left lower leg: No swelling.  Skin:    General: Skin is warm.     Findings: No rash.  Neurological:     Mental Status: He is alert and oriented to person, place, and time.     Data Reviewed: White blood cell count 8.2, hemoglobin 8.7, platelet count 295  Family Communication: Spoke with wife on the phone  Disposition: Status is: Inpatient Remains inpatient appropriate because: Advised he needs to come off IV pain medications prior to disposition.  Urinated well after Foley came out this morning.  Planned Discharge Destination: Home    Time spent: 28 minutes  Author: Alford Highland, MD 07/27/2023 3:32 PM  For on call review [email or web address].

## 2023-07-27 NOTE — Plan of Care (Signed)

## 2023-07-27 NOTE — Consult Note (Signed)
PHARMACIST - PHYSICIAN COMMUNICATION  DR: Alford Highland, MD  CONCERNING: IV to Oral Route Change Policy  RECOMMENDATION: This patient is receiving pantoprazole by the intravenous route.  Based on criteria approved by the Pharmacy and Therapeutics Committee, the intravenous medication(s) is/are being converted to the equivalent oral dose form(s).   DESCRIPTION: These criteria include: The patient is eating (either orally or via tube) and/or has been taking other orally administered medications for a least 24 hours The patient has no evidence of active gastrointestinal bleeding or impaired GI absorption (gastrectomy, short bowel, patient on TNA or NPO).  If you have questions about this conversion, please contact the Pharmacy Department  []   [phone] )  [facility] [x]   [phone] )  [facility] []   [phone] )  [facility] []   [phone] )  [facility] []   [phone] )  Ilene Qua   Celene Squibb, PharmD Clinical Pharmacist 07/27/2023 12:22 PM

## 2023-07-27 NOTE — Progress Notes (Signed)
Nutrition Follow-up  DOCUMENTATION CODES:   Non-severe (moderate) malnutrition in context of chronic illness  INTERVENTION:   -Continue MVI with minerals daily -D/c Boost Breeze -D/c Prosource Plus -Ensure Enlive po TID, each supplement provides 350 kcal and 20 grams of protein -Magic cup TID with meals, each supplement provides 290 kcal and 9 grams of protein   NUTRITION DIAGNOSIS:   Moderate Malnutrition related to chronic illness (alcoholic pancreatitis) as evidenced by mild fat depletion, moderate fat depletion, mild muscle depletion, moderate muscle depletion, energy intake < 75% for > or equal to 1 month.  Ongoing  GOAL:   Patient will meet greater than or equal to 90% of their needs  Progressing   MONITOR:   PO intake, Supplement acceptance, Diet advancement  REASON FOR ASSESSMENT:   Malnutrition Screening Tool    ASSESSMENT:   Pt with medical history significant of prior history of alcoholism who actually quit a few months ago, tobacco abuse, prior history of pancreatitis who presents with abdominal pain and nausea.  9/8- s/p rt paracentesis (5.8 L removed)  9/11- s/p lt lower abdomen paracentesis (400 ml removed) 9/12- syncopal event, s/p diagnostic ERCP at [facility] with pancreatic stent replacement, advanced to full liquid diet 9/14- advanced to soft diet  Reviewed I/O's: -505 ml x 24 hours and +2.1 L since admission  UOP: 925 ml x 24 hours   GI has signed off secondary to no further bleeding.   Pt resting quietly at time of visit. No family at bedside.   Pt now on a soft diet. Noted improvement in oral intake; PO 80% yesterday. Pt is drinking Boost Breeze supplements, but will transition to Ensure secondary to diet advancement and increased nutritional density.   Noted mild wt gain since admission (suspect ascites may contribute to weight gain), however, overall weight gain favorable given pt's malnutrition.   Medications reviewed and include creon.    Labs reviewed: CBGS: 158 (inpatient orders for glycemic control are none).    Diet Order:   Diet Order             DIET SOFT Room service appropriate? Yes; Fluid consistency: Thin  Diet effective now                   EDUCATION NEEDS:   Education needs have been addressed  Skin:  Skin Assessment: Reviewed RN Assessment  Last BM:  07/26/23 (type 6)  Height:   Ht Readings from Last 1 Encounters:  07/17/23 6\' 1"  (1.854 m)    Weight:   Wt Readings from Last 1 Encounters:  07/26/23 72.2 kg    Ideal Body Weight:  83.6 kg  BMI:  Body mass index is 21 kg/m.  Estimated Nutritional Needs:   Kcal:  2300-2500  Protein:  130-145 grams  Fluid:  > 2 L    Levada Schilling, RD, LDN, CDCES Registered Dietitian II Certified Diabetes Care and Education Specialist Please refer to [facility] for RD and/or RD on-call/weekend/after hours pager

## 2023-07-28 DIAGNOSIS — K852 Alcohol induced acute pancreatitis without necrosis or infection: Secondary | ICD-10-CM | POA: Diagnosis not present

## 2023-07-28 DIAGNOSIS — D62 Acute posthemorrhagic anemia: Secondary | ICD-10-CM | POA: Diagnosis not present

## 2023-07-28 DIAGNOSIS — K661 Hemoperitoneum: Secondary | ICD-10-CM | POA: Diagnosis not present

## 2023-07-28 DIAGNOSIS — K922 Gastrointestinal hemorrhage, unspecified: Secondary | ICD-10-CM | POA: Diagnosis not present

## 2023-07-28 LAB — CBC
HCT: 26.3 % — ABNORMAL LOW (ref 39.0–52.0)
Hemoglobin: 8.6 g/dL — ABNORMAL LOW (ref 13.0–17.0)
MCH: 28.6 pg (ref 26.0–34.0)
MCHC: 32.7 g/dL (ref 30.0–36.0)
MCV: 87.4 fL (ref 80.0–100.0)
Platelets: 357 10*3/uL (ref 150–400)
RBC: 3.01 MIL/uL — ABNORMAL LOW (ref 4.22–5.81)
RDW: 15.1 % (ref 11.5–15.5)
WBC: 7.6 10*3/uL (ref 4.0–10.5)
nRBC: 0 % (ref 0.0–0.2)

## 2023-07-28 MED ORDER — ACETAMINOPHEN 325 MG PO TABS: 650.0000 mg | ORAL_TABLET | Freq: Three times a day (TID) | ORAL | Status: AC | PRN

## 2023-07-28 MED ORDER — ONDANSETRON HCL 4 MG PO TABS: 4.0000 mg | ORAL_TABLET | Freq: Four times a day (QID) | ORAL | 0 refills | Status: AC | PRN

## 2023-07-28 MED ORDER — FUROSEMIDE 20 MG PO TABS: 20.0000 mg | ORAL_TABLET | Freq: Every day | ORAL | 0 refills | Status: AC

## 2023-07-28 MED ORDER — ENSURE ENLIVE PO LIQD: 237.0000 mL | Freq: Three times a day (TID) | ORAL | 0 refills | Status: AC

## 2023-07-28 MED ORDER — SPIRONOLACTONE 25 MG PO TABS: 12.5000 mg | ORAL_TABLET | Freq: Every day | ORAL | 0 refills | Status: AC

## 2023-07-28 MED ORDER — HYDROCODONE-ACETAMINOPHEN 10-325 MG PO TABS: 1.0000 | ORAL_TABLET | Freq: Four times a day (QID) | ORAL | 0 refills | Status: DC | PRN

## 2023-07-28 MED ORDER — TAMSULOSIN HCL 0.4 MG PO CAPS: 0.4000 mg | ORAL_CAPSULE | Freq: Every day | ORAL | 0 refills | Status: AC

## 2023-07-28 MED ORDER — SIMETHICONE 80 MG PO CHEW: 80.0000 mg | CHEWABLE_TABLET | Freq: Four times a day (QID) | ORAL | 0 refills | Status: AC | PRN

## 2023-07-28 MED ORDER — PANTOPRAZOLE SODIUM 40 MG PO TBEC: 40.0000 mg | DELAYED_RELEASE_TABLET | Freq: Every day | ORAL | 0 refills | Status: AC

## 2023-07-28 MED ORDER — CITALOPRAM HYDROBROMIDE 20 MG PO TABS: 20.0000 mg | ORAL_TABLET | Freq: Every day | ORAL | 0 refills | Status: AC

## 2023-07-28 MED ORDER — PANCRELIPASE (LIP-PROT-AMYL) 36000-114000 UNITS PO CPEP: 72000.0000 [IU] | ORAL_CAPSULE | Freq: Three times a day (TID) | ORAL | 0 refills | Status: AC

## 2023-07-28 MED ORDER — HYDROCODONE-ACETAMINOPHEN 10-325 MG PO TABS: 1.0000 | ORAL_TABLET | Freq: Four times a day (QID) | ORAL | 0 refills | Status: AC | PRN

## 2023-07-28 NOTE — Discharge Summary (Signed)
Physician Discharge Summary   Patient: Shane Jenkins Sr. MRN: [id number] DOB: Jun 04, 1986  Admit date:     07/17/2023  Discharge date: 07/28/23  Discharge Physician: Alford Highland   PCP: Will need a new PCP  Recommendations at discharge:   Call Medicaid and get a new PCP assigned to you Follow-up with Dr. Arty Baumgartner gastroenterologist at [facility].  Discharge Diagnoses: Principal Problem:   Acute blood loss anemia Active Problems:   Upper GI bleeding   Hemoperitoneum   Spontaneous bacterial peritonitis (HCC)   Severe sepsis (HCC)   Alcohol use disorder, moderate, dependence (HCC)   Alcohol induced acute pancreatitis   Pancreatic pseudocyst   Hyponatremia   Pancreatic ascites   Tobacco abuse   Sinus tachycardia   AKI (acute kidney injury) (HCC)   Protein-calorie malnutrition, severe   Pancreatitis   Hypomagnesemia   Reactive thrombocytosis   Normal anion gap metabolic acidosis   Hypocalcemia   Major depression   Acute urinary retention    Hospital Course: Shane Kriete Sr. is a 37 y.o. male with medical history significant of prior history of alcoholism who actually quit a few months ago, tobacco abuse, prior history of pancreatitis who presents to the ER with abdominal pain and nausea.  Upon arriving the emergency room, her heart rate was 147, respiratory rate was 34, lab study significant leukocytosis of 21, lactic acid 3.7.  Patient also has significant abdominal tenderness with rebound tenderness.  CT abdomen/pelvis showed moderate to large ascites with peritoneal enhancement.  Small pancreatic pseudocyst. Patient was started on IV fluids, pain medicine.  Due to concern for peritonitis, patient was started on Rocephin.  9/8.  5.8 L drawn off with paracentesis 9/11.  400 mL drawn off with paracentesis.  Confirmed with [facility] that the plan is to transfer over to [facility] for procedure starting at 10 AM. 9/12.  Patient seen prior to transfer to [facility] for ERCP procedure. 9/13.   Patient had more abdominal pain overnight.  Had a CT scan.  Which showed hemoperitoneum likely from last paracentesis.  Patient was started on empiric antibiotic.  Patient was evaluated this morning by me.  Then had an episode of dark reddish maroonish blood.  Patient already on IV Protonix.  Case discussed with general surgery and gastroenterology.  CT angio negative for acute bleeding.  Continue to monitor hemoglobin.  Last hemoglobin 8.0 and a unit of blood was ordered. 9/14.  Patient has not had a bloody bowel movement since yesterday.  Hemoglobin down to 7.5.  Will transfuse another unit of blood today. 9/15.  Patient's hemoglobin 8.3 this morning.  Continue to monitor closely. 9/16.  Patient still having a lot of soreness in his abdomen.  Received IV pain medication this morning.  Advised in order to go home we need to be off the IV pain medication.  Hemoglobin up to 8.7 today. 9/17.  Hemoglobin 8.6 today.  Still having soreness in his abdomen but willing to go home and follow-up as outpatient.  Assessment and Plan: * Acute blood loss anemia Received 2 units of packed red blood cells while here.  Today's hemoglobin 8.6.  Combination of hemoperitoneum from last paracentesis and GI bleed.  Upper GI bleeding CT angio negative for active bleeding but does have hemoperitoneum.  Patient did have 1 episode of dark red blood 4 days ago.  Discharged on oral Protonix.  Hemoglobin stable at 8.6.  Hemoperitoneum Likely from last paracentesis.  Hemoglobin stable at 8.6.  Severe sepsis (HCC) Secondary to spontaneous  bacterial peritonitis.  No growth out of culture.  Patient completed antibiotics with Rocephin but put on empiric Zosyn with worsening abdominal pain the other day.  Discontinued Zosyn on 9/15.  Spontaneous bacterial peritonitis (HCC) Completed course of Rocephin but placed on Zosyn with worsening abdominal pain.  Zosyn discontinued on 9/15.  Pancreatic pseudocyst Large pseudocyst seen on  CT scan of the abdomen.  Patient had pancreatic stent placed by [facility] on 9/12.  Will need follow-up at [facility] gastroenterology in 1 month for evaluation for stent removal or replacement.  Alcohol induced acute pancreatitis Continue soft diet  Hyponatremia Last sodium normal range.  Pancreatic ascites Patient 5.9 L drawn off on 9/8.  400 mL drawn off on 9/11 with hemoperitoneum afterwards.  Will try low-dose spironolactone and low-dose Lasix.  Acute urinary retention Resolved.  Continue Flomax.  Urinated after Foley removal.  Major depression On Celexa  Hypocalcemia Last calcium 7.5  Normal anion gap metabolic acidosis Improved  Reactive thrombocytosis Platelet count normalized  Hypomagnesemia Replaced  Protein-calorie malnutrition, severe Continue supplements  AKI (acute kidney injury) [facility]) Resolved         Consultants: Gastroenterology, general surgery Procedures performed: Patient was transferred to [facility] for pancreatic stent and transferred back. Disposition: Home Diet recommendation:  Low-fat DISCHARGE MEDICATION: Allergies as of 07/28/2023       Reactions   Oxycodone Rash   Rash/itching which required benadryl to resolve   Sudafed [pseudoephedrine]    History per pt of this being linked with his seizures        Medication List     STOP taking these medications    cyanocobalamin 1000 MCG tablet   ergocalciferol 1.25 MG (50000 UT) capsule Commonly known as: VITAMIN D2       TAKE these medications    acetaminophen 325 MG tablet Commonly known as: TYLENOL Take 2 tablets (650 mg total) by mouth every 8 (eight) hours as needed for mild pain.   citalopram 20 MG tablet Commonly known as: CELEXA Take 1 tablet (20 mg total) by mouth daily. Start taking on: July 29, 2023   feeding supplement Liqd Take 237 mLs by mouth 3 (three) times daily between meals.   furosemide 20 MG tablet Commonly known as: Lasix Take 1 tablet (20 mg total) by  mouth daily.   HYDROcodone-acetaminophen 10-325 MG tablet Commonly known as: NORCO Take 1 tablet by mouth every 6 (six) hours as needed for up to 5 days for severe pain or moderate pain.   lipase/protease/amylase [address] UNITS Cpep capsule Commonly known as: CREON Take 2 capsules (72,000 Units total) by mouth 3 (three) times daily with meals.   ondansetron 4 MG tablet Commonly known as: ZOFRAN Take 1 tablet (4 mg total) by mouth every 6 (six) hours as needed for nausea.   pantoprazole 40 MG tablet Commonly known as: PROTONIX Take 1 tablet (40 mg total) by mouth daily.   simethicone 80 MG chewable tablet Commonly known as: MYLICON Chew 1 tablet (80 mg total) by mouth 4 (four) times daily as needed for flatulence.   spironolactone 25 MG tablet Commonly known as: Aldactone Take 0.5 tablets (12.5 mg total) by mouth daily.   tamsulosin 0.4 MG Caps capsule Commonly known as: FLOMAX Take 1 capsule (0.4 mg total) by mouth daily after supper.        Follow-up Information     call your medicaid card and get assigened a doctor Follow up.          Arty Baumgartner  Lanora Manis, MD. Nyra Capes on 07/29/2023.   Specialty: Gastroenterology Why: Your already have a appointment 07/29/2023  at 8:00am. This can be your follow up. Contact information: [address] [place] [address] [phone]                Discharge Exam: Ceasar Mons Weights   07/17/23 1357 07/26/23 0425  Weight: 65.8 kg 72.2 kg   Physical Exam HENT:     Head: Normocephalic.     Mouth/Throat:     Pharynx: No oropharyngeal exudate.  Eyes:     General: Lids are normal.     Conjunctiva/sclera: Conjunctivae normal.  Cardiovascular:     Rate and Rhythm: Normal rate and regular rhythm.     Heart sounds: Normal heart sounds, S1 normal and S2 normal.  Pulmonary:     Breath sounds: No decreased breath sounds, wheezing, rhonchi or rales.  Abdominal:     General: There is distension.     Palpations: Abdomen is soft.      Tenderness: There is generalized abdominal tenderness.  Musculoskeletal:     Right lower leg: No swelling.     Left lower leg: No swelling.  Skin:    General: Skin is warm.     Findings: No rash.  Neurological:     Mental Status: He is alert and oriented to person, place, and time.      Condition at discharge: fair  The results of significant diagnostics from this hospitalization (including imaging, microbiology, ancillary and laboratory) are listed below for reference.   Imaging Studies: CT ANGIO GI BLEED  Result Date: 07/24/2023 CLINICAL DATA:  Abdominal pain, concern for GI bleed EXAM: CTA ABDOMEN AND PELVIS WITHOUT AND WITH CONTRAST TECHNIQUE: Multidetector CT imaging of the abdomen and pelvis was performed using the standard protocol during bolus administration of intravenous contrast. Multiplanar reconstructed images and MIPs were obtained and reviewed to evaluate the vascular anatomy. RADIATION DOSE REDUCTION: This exam was performed according to the departmental dose-optimization program which includes automated exposure control, adjustment of the mA and/or kV according to patient size and/or use of iterative reconstruction technique. CONTRAST:  OMNIPAQUE IOHEXOL 350 MG/ML SOLN COMPARISON:  None Available. FINDINGS: Lower chest: Small bilateral pleural effusions and atelectasis. Small pericardial effusion. Hepatobiliary: No focal liver abnormality is seen. Distended gallbladder containing hyperdense material, likely vicarious excretion of contrast. No biliary ductal dilation. Pancreas: Numerous cystic lesions of the pancreas, largest is in the uncinate process and measures 1.8 cm on series 16, image 95. Small locule of gas seen within the pancreatic duct on series 16, image 82, unchanged when compared with the prior. Prominent irregular main pancreatic duct, unchanged. Spleen: Normal in size without focal abnormality. Adrenals/Urinary Tract: Bilateral adrenal glands are  unremarkable. No hydronephrosis or nephrolithiasis. Mildly thick-walled urinary bladder which is decompressed and contains a Foley catheter. Stomach/Bowel: Stomach is within normal limits. Appendix appears normal. No evidence of bowel wall thickening, distention, or inflammatory changes. Vascular and lymphatic: No evidence of active arterial extravasation. Normal caliber thoracic aorta with no significant atherosclerotic disease. No significant vascular findings are present. No enlarged abdominal or pelvic lymph nodes. Reproductive: Prostate is unremarkable. Other: Large volume abdominopelvic ascites with hyperdense material seen in the left anterior pelvis, unchanged when compared with the prior exam. Unchanged loculated fluid collections located along the proximal stomach and caudate lobe. Small bilateral fat containing inguinal hernias. Musculoskeletal: Redemonstrated asymmetric swelling of the left rectus abdominus. No aggressive appearing osseous lesions. IMPRESSION: 1. No evidence of active arterial  extravasation. 2. Large volume abdominopelvic ascites with hyperdense material seen in the left anterior pelvis, unchanged when compared with the prior exam and consistent with hemoperitoneum. 3. Redemonstrated asymmetric swelling of the left rectus abdominus, consistent with small rectus sheath hematoma. 4. Redemonstrated small intraductal locule gas of the pancreatic neck, correlate for history of recent instrumentation. 5. Numerous cystic lesions of the pancreas, likely pseudocysts given history of acute pancreatitis. 6. Small bilateral pleural effusions and small pericardial effusion. Electronically Signed   By: Allegra Lai M.D.   On: 07/24/2023 15:42   CT ABDOMEN PELVIS W CONTRAST  Result Date: 07/24/2023 CLINICAL DATA:  Peritonitis.  Status post image guided paracentesis. EXAM: CT ABDOMEN AND PELVIS WITH CONTRAST TECHNIQUE: Multidetector CT imaging of the abdomen and pelvis was performed using the  standard protocol following bolus administration of intravenous contrast. RADIATION DOSE REDUCTION: This exam was performed according to the departmental dose-optimization program which includes automated exposure control, adjustment of the mA and/or kV according to patient size and/or use of iterative reconstruction technique. CONTRAST:  OMNIPAQUE IOHEXOL 300 MG/ML  SOLN COMPARISON:  07/17/2023 FINDINGS: Lower chest: Dependent collapse/consolidation noted both lower lobes, left greater than right, likely atelectasis. Small bilateral pleural effusions are new in the interval. Hepatobiliary: No suspicious focal abnormality within the liver parenchyma. Layering high attenuation material in the gallbladder is new in the interval, likely related to vicarious excretion of intravenous contrast material. No intrahepatic biliary duct dilatation. No evidence for common bile duct dilatation. Pancreas: Diffuse atrophy of pancreatic parenchyma is associated with diffuse dilatation of the main pancreatic duct. Interval development of a small gas bubble in the head of the pancreas, likely intraductal (axial 37/2 and sagittal 55/6). As noted previously, multiple scattered tiny cystic foci are seen in the pancreatic parenchyma including a dominant 17 mm collection in the uncinate process, stable. Spleen: No splenomegaly. No suspicious focal mass lesion. Adrenals/Urinary Tract: No adrenal nodule or mass. Kidneys unremarkable. No evidence for hydroureter. Bladder is decompressed by Foley catheter. A tiny gas bubble in the anterior bladder on 92/2 (sagittal 59/6) is compatible with the instrumentation. Stomach/Bowel: Stomach is unremarkable. No gastric wall thickening. No evidence of outlet obstruction. Duodenum is normally positioned as is the ligament of Treitz. No small bowel wall thickening. No small bowel dilatation. The terminal ileum is normal. The appendix is normal. No gross colonic mass. No colonic wall thickening.  Vascular/Lymphatic: No abdominal aortic aneurysm. No abdominal aortic atherosclerotic calcification. Portal vein, superior mesenteric vein, and splenic vein are patent. No complication involving the celiac axis or SMA. There is no gastrohepatic or hepatoduodenal ligament lymphadenopathy. No retroperitoneal or mesenteric lymphadenopathy. No pelvic sidewall lymphadenopathy. Reproductive: The prostate gland and seminal vesicles are unremarkable. Other: Similar large volume abdominopelvic ascites. Some of the fluid collections appear loculated and progressive in the interval including a 9.0 x 5.7 cm collection along the proximal stomach (image 23/2). Loculated fluid is seen around the caudate lobe on 23/2, also progressive in the interval. Fluid in the para colic gutters bilaterally is similar with some apparent enhancement of the adjacent peritoneum. In the interval since the previous study there is now more heterogeneous attenuation in the free fluid of the anterior pelvis (see images 72-79 of series 2. Given the rapid interval appearance, this is probably related to blood products. Infectious debris is also a consideration but there is no evidence for extraluminal gas within the ascites. Musculoskeletal: New asymmetry of the inferior rectus sheath noted with enlargement of the lower left rectus  musculature. No worrisome lytic or sclerotic osseous abnormality. IMPRESSION: 1. Interval development of heterogeneous attenuation in the free fluid of the left anterior pelvis. Given the rapid interval appearance and recent paracentesis, imaging features are felt to be most likely related to blood products. Infectious debris is also a consideration but this is considered less likely given CT imaging appearance. 2. New asymmetry of the inferior left rectus sheath with enlargement of the lower left rectus musculature. This is probably a small rectus sheath hematoma. 3. Similar large volume abdominopelvic ascites with interval  progression of loculated fluid collections along the proximal stomach and around the caudate lobe. Evolving pseudocysts would be a consideration. 4. Interval development of a small gas bubble in the head of the pancreas, likely intraductal. In the absence of recent instrumentation, infection would be a consideration. Reflux of gas from the duodenum into the pancreatic duct would also be a consideration. 5. New small bilateral pleural effusions with dependent collapse/consolidation in both lower lobes, left greater than right. Electronically Signed   By: Kennith Center M.D.   On: 07/24/2023 06:50   US Paracentesis  Result Date: 07/22/2023 INDICATION: History of alcoholic cirrhosis and pancreatitis. Request received for therapeutic paracentesis prior to patient having abdominal procedure performed at [facility] on 07/23/2023. EXAM: ULTRASOUND GUIDED  PARACENTESIS MEDICATIONS: 6 cc 1% lidocaine COMPLICATIONS: None immediate. PROCEDURE: Informed written consent was obtained from the patient after a discussion of the risks, benefits and alternatives to treatment. A timeout was performed prior to the initiation of the procedure. Initial ultrasound scanning demonstrates a moderate amount of ascites within the left lower abdominal quadrant. The left lower abdomen was prepped and draped in the usual sterile fashion. 1% lidocaine was used for local anesthesia. Following this, a 19 gauge, 7-cm, Yueh catheter was introduced. An ultrasound image was saved for documentation purposes. The paracentesis was performed. The catheter was removed and a dressing was applied. The patient tolerated the procedure well without immediate post procedural complication. FINDINGS: A total of approximately 400 mL of golden yellow fluid was removed. Ordering provider did not request laboratory samples. IMPRESSION: Successful ultrasound-guided paracentesis yielding 400 mL of peritoneal fluid. PLAN: The patient has previously been  formally evaluated by the [facility] and is being actively followed for potential future intervention. Procedure performed by Mina Marble, PA-C Electronically Signed   By: Olive Bass M.D.   On: 07/22/2023 16:22   US Paracentesis  Result Date: 07/19/2023 INDICATION: History of alcoholic cirrhosis and pancreatitis. Concern for peritonitis today. Request received for diagnostic and therapeutic paracentesis. EXAM: ULTRASOUND GUIDED PARACENTESIS MEDICATIONS: 8 cc 1% lidocaine COMPLICATIONS: None immediate. PROCEDURE: Informed written consent was obtained from the patient after a discussion of the risks, benefits and alternatives to treatment. A timeout was performed prior to the initiation of the procedure. Initial ultrasound scanning demonstrates a large amount of ascites within the right lower abdominal quadrant. The right lower abdomen was prepped and draped in the usual sterile fashion. 1% lidocaine was used for local anesthesia. Following this, a 19 gauge, 7-cm, Yueh catheter was introduced. An ultrasound image was saved for documentation purposes. The paracentesis was performed. The catheter was removed and a dressing was applied. The patient tolerated the procedure well without immediate post procedural complication. Patient received post-procedure intravenous albumin; see nursing notes for details. FINDINGS: A total of approximately 5.8 L of hazy, yellow-white fluid was removed. Samples were sent to the laboratory as requested by the clinical team. IMPRESSION: Successful  ultrasound-guided paracentesis yielding 5.8 liters of peritoneal fluid. PLAN: The patient has previously been formally evaluated by the [facility] Interventional Radiology Portal Hypertension [facility] and is being actively followed for potential future intervention. Procedure performed by Mina Marble, PA-C Electronically Signed   By: Simonne Come M.D.   On: 07/19/2023 14:47   DG ABD  ACUTE 2+V W 1V CHEST  Result Date: 07/18/2023 CLINICAL DATA:  Abdominal pain and nausea. EXAM: DG ABDOMEN ACUTE WITH 1 VIEW CHEST COMPARISON:  Chest radiograph on 01/01/2021 FINDINGS: There is no evidence of dilated bowel loops or free intraperitoneal air. No radiopaque calculi or other significant radiographic abnormality is seen. Contrast seen in urinary bladder from recent CT. Heart size and mediastinal contours are within normal limits. Mild scarring again noted in left lung base. No No evidence of acute infiltrate or pleural effusion. IMPRESSION: Unremarkable bowel gas pattern. Mild left basilar scarring.  No active cardiopulmonary disease. Electronically Signed   By: Danae Orleans M.D.   On: 07/18/2023 10:28   CT ABDOMEN PELVIS W CONTRAST  Result Date: 07/17/2023 CLINICAL DATA:  Severe abdominal pain beginning this morning. Nausea and vomiting diarrhea. Tachycardia. Pancreatitis. EXAM: CT ABDOMEN AND PELVIS WITH CONTRAST TECHNIQUE: Multidetector CT imaging of the abdomen and pelvis was performed using the standard protocol following bolus administration of intravenous contrast. RADIATION DOSE REDUCTION: This exam was performed according to the departmental dose-optimization program which includes automated exposure control, adjustment of the mA and/or kV according to patient size and/or use of iterative reconstruction technique. CONTRAST:  OMNIPAQUE IOHEXOL 300 MG/ML  SOLN COMPARISON:  06/23/2023 FINDINGS: Lower Chest: No acute findings. Hepatobiliary: No suspicious hepatic masses identified. Gallbladder is unremarkable. No evidence of biliary ductal dilatation. Pancreas: Mild dilatation and irregularity of the pancreatic duct is again seen, consistent with chronic pancreatitis. No pancreatic calcifications noted. Few small cystic lesions are again seen in the uncinate process and body, largest measuring 12 mm in the uncinate process, which is mildly decreased from 17 mm on previous study. These are  consistent with small pseudocysts. No pancreatic masses or acute peripancreatic inflammatory changes are seen. Multiple multiloculated fluid collections are again seen throughout the abdomen which show thin peripheral rim enhancement. Moderate large amount of ascites is again seen in the abdomen and pelvis showing mild peritoneal enhancement. These findings show no significant change in consistent with pseudocysts. Spleen: Within normal limits in size and appearance. Adrenals/Urinary Tract: No suspicious masses identified. No evidence of ureteral calculi or hydronephrosis. Stomach/Bowel: New small hiatal hernia noted. No evidence of bowel obstruction. Mild small bowel ileus is again noted with mild diffuse small bowel wall thickening, consistent with enteritis. Vascular/Lymphatic: No pathologically enlarged lymph nodes. No acute vascular findings. Chronic splenic vein thrombosis is again seen with numerous venous collaterals in the gastrosplenic ligament. Reproductive:  No mass or other significant abnormality. Other:  None. Musculoskeletal:  No suspicious bone lesions identified. IMPRESSION: Stable changes of chronic pancreatitis, with probable tiny pseudocysts in the uncinate process and body. No radiographic signs of acute pancreatitis. Multiple multiloculated fluid collections throughout the abdomen, and moderate to large amount of ascites showing mild peritoneal enhancement. These findings are consistent with pancreatic pseudocysts. No significant change in mild small bowel ileus, and diffuse small bowel wall enteritis which may be reactive. No evidence of bowel obstruction. Chronic splenic vein thrombosis, with numerous venous collaterals in the gastrosplenic ligament. New small hiatal hernia. Electronically Signed   By: Danae Orleans M.D.   On: 07/17/2023 17:42   US  Paracentesis  Result Date: 07/10/2023 INDICATION: Ascites. Request received for diagnostic and therapeutic paracentesis History of EtOH  cirrhosis and pancreatitis. EXAM: ULTRASOUND GUIDED PARACENTESIS MEDICATIONS: 6 cc 1% lidocaine COMPLICATIONS: None immediate. PROCEDURE: Informed written consent was obtained from the patient after a discussion of the risks, benefits and alternatives to treatment. A timeout was performed prior to the initiation of the procedure. Initial ultrasound scanning demonstrates a large amount of ascites within the right lower abdominal quadrant. The right lower abdomen was prepped and draped in the usual sterile fashion. 1% lidocaine was used for local anesthesia. Following this, a 19 gauge, 7-cm, Yueh catheter was introduced. An ultrasound image was saved for documentation purposes. The paracentesis was performed. The catheter was removed and a dressing was applied. The patient tolerated the procedure well without immediate post procedural complication. FINDINGS: A total of approximately 4 L of hazy white fluid was removed. Samples were sent to the laboratory as requested by the clinical team. IMPRESSION: Successful ultrasound-guided paracentesis yielding 4 liters of peritoneal fluid. Procedure performed by Mina Marble, PA-C PLAN: The patient has required >/=2 paracenteses in a 30 day period and a formal evaluation by the [facility] has been arranged. Roanna Banning, MD [facility] Electronically Signed   By: Roanna Banning M.D.   On: 07/10/2023 11:24   US Paracentesis  Result Date: 06/30/2023 INDICATION: [id number] Ascites [id number] EXAM: ULTRASOUND GUIDED  PARACENTESIS MEDICATIONS: None. COMPLICATIONS: None immediate. PROCEDURE: Informed written consent was obtained from the patient after a discussion of the risks, benefits and alternatives to treatment. A timeout was performed prior to the initiation of the procedure. Initial ultrasound scanning demonstrates a large amount of ascites within the right lower abdominal  quadrant. The right lower abdomen was prepped and draped in the usual sterile fashion. 1% lidocaine was used for local anesthesia. Following this, a 19 gauge, 7-cm, Yueh catheter was introduced. An ultrasound image was saved for documentation purposes. The paracentesis was performed. The catheter was removed and a dressing was applied. The patient tolerated the procedure well without immediate post procedural complication. FINDINGS: A total of approximately 2.9 L of clear, straw-colored peritoneal fluid was removed. IMPRESSION: Successful ultrasound-guided paracentesis yielding 2.9 liters of peritoneal fluid. Electronically Signed   By: Olive Bass M.D.   On: 06/30/2023 08:43    Microbiology: Results for orders placed or performed during the hospital encounter of 07/17/23  Culture, blood (Routine X 2) w Reflex to ID Panel     Status: None   Collection Time: 07/18/23  1:38 AM   Specimen: BLOOD  Result Value Ref Range Status   Specimen Description BLOOD BLOOD LEFT ARM  Final   Special Requests   Final    BOTTLES DRAWN AEROBIC ONLY Blood Culture results may not be optimal due to an inadequate volume of blood received in culture bottles   Culture   Final    NO GROWTH 5 DAYS Performed at [facility], [address], [place], [place] [address]    Report Status 07/23/2023 FINAL  Final  Culture, blood (Routine X 2) w Reflex to ID Panel     Status: None   Collection Time: 07/18/23  1:38 AM   Specimen: BLOOD  Result Value Ref Range Status   Specimen Description BLOOD BLOOD LEFT HAND  Final   Special Requests   Final    BOTTLES DRAWN AEROBIC AND ANAEROBIC Blood Culture adequate volume   Culture   Final  NO GROWTH 5 DAYS Performed at [facility], [address] [place], [place] [address]    Report Status 07/23/2023 FINAL  Final  Aerobic Culture w Gram Stain (superficial specimen)     Status: None   Collection Time: 07/19/23  9:09 AM   Specimen: A: PATH Cytology Pleural  fluid   B: PATH Cytology Peritoneal fluid  Result Value Ref Range Status   Specimen Description   Final    PERITONEAL Performed at [facility], [address], [place], [place] [address]    Special Requests   Final    PERITONEAL Performed at [facility], [address] [place], [place] [address]    Gram Stain NO WBC SEEN NO ORGANISMS SEEN   Final   Culture   Final    NO GROWTH 2 DAYS Performed at [facility], [address] [place], [place] [address]    Report Status 07/21/2023 FINAL  Final  Body fluid culture w Gram Stain     Status: None   Collection Time: 07/19/23  9:09 AM   Specimen: A: PATH Cytology Pleural fluid   B: PATH Cytology Peritoneal fluid  Result Value Ref Range Status   Specimen Description   Final    PERITONEAL Performed at [facility], [address] [place], [place] [address]    Special Requests   Final    PERITONEAL Performed at [facility], [address] [place], [place] [address]    Gram Stain NO WBC SEEN NO ORGANISMS SEEN   Final   Culture   Final    NO GROWTH 3 DAYS Performed at [facility], [address] [place], [place] [address]    Report Status 07/22/2023 FINAL  Final  Culture, blood (Routine X 2) w Reflex to ID Panel     Status: None (Preliminary result)   Collection Time: 07/24/23  5:45 AM   Specimen: BLOOD  Result Value Ref Range Status   Specimen Description BLOOD LEFT ANTECUBITAL  Final   Special Requests   Final    BOTTLES DRAWN AEROBIC AND ANAEROBIC Blood Culture adequate volume   Culture   Final    NO GROWTH 4 DAYS Performed at [facility], [address] [place], [place] [address]    Report Status PENDING  Incomplete  Culture, blood (Routine X 2) w Reflex to ID Panel     Status: None (Preliminary result)   Collection Time: 07/24/23  5:45 AM   Specimen: BLOOD  Result Value Ref Range Status   Specimen Description BLOOD BLOOD LEFT HAND  Final    Special Requests   Final    BOTTLES DRAWN AEROBIC AND ANAEROBIC Blood Culture adequate volume   Culture   Final    NO GROWTH 4 DAYS Performed at [facility], [address] [place], [place] [address]    Report Status PENDING  Incomplete    Labs: CBC: Recent Labs  Lab 07/24/23 0432 07/24/23 1129 07/25/23 0613 07/25/23 1419 07/26/23 0435 07/27/23 0745 07/28/23 0911  WBC 14.8*  --  10.7*  --  7.8 8.2 7.6  NEUTROABS 10.4*  --   --   --   --   --   --   HGB 9.3*   < > 7.5* 8.8* 8.3* 8.7* 8.6*  HCT 27.3*  --  22.8*  --  24.6* 26.2* 26.3*  MCV 84.0  --  86.0  --  84.0 85.3 87.4  PLT 378  --  248  --  256 295 357   < > = values in this interval not displayed.   Basic Metabolic Panel: Recent Labs  Lab 07/22/23 0431 07/24/23 0432 07/26/23 0435  NA 132* 134* 135  K 4.2 4.2 4.0  CL 93* 97* 99  CO2 32 26 29  GLUCOSE 93 162* 101*  BUN 7 7 11   CREATININE 0.60* 0.65 0.80  CALCIUM 7.7* 7.8* 7.5*  MG  --  1.6*  --   PHOS 4.1  --   --    Liver Function Tests: Recent Labs  Lab 07/22/23 0431 07/24/23 0432  AST 11* 12*  ALT 6 7  ALKPHOS 58 54  BILITOT 0.4 0.4  PROT 4.7* 4.6*  ALBUMIN 1.9* 1.8*   CBG: Recent Labs  Lab 07/22/23 1307 07/22/23 1701 07/22/23 2100 07/23/23 1559 07/24/23 0409  GLUCAP 116* 100* 95 141* 158*    Discharge time spent: greater than 30 minutes.  Signed: Alford Highland, MD [facility] 07/28/2023

## 2023-07-28 NOTE — Progress Notes (Signed)
Discharge instructions were reviewed with patient. Questions were answered and encouraged. X3 IV were removed. Belongings collected by patient.

## 2023-07-29 LAB — CULTURE, BLOOD (ROUTINE X 2)
Culture: NO GROWTH
Culture: NO GROWTH
Special Requests: ADEQUATE
Special Requests: ADEQUATE

## 2023-08-07 LAB — ACID FAST CULTURE WITH REFLEXED SENSITIVITIES (MYCOBACTERIA): Acid Fast Culture: NEGATIVE

## 2023-11-18 ENCOUNTER — Emergency Department
Admission: EM | Admit: 2023-11-18 | Discharge: 2023-11-18 | Discharge status: Against Medical Advice | Payer: Medicaid Other | Attending: Emergency Medicine | Admitting: Emergency Medicine

## 2023-11-18 ENCOUNTER — Other Ambulatory Visit: Payer: Self-pay

## 2023-11-18 DIAGNOSIS — Z5321 Procedure and treatment not carried out due to patient leaving prior to being seen by health care provider: Secondary | ICD-10-CM | POA: Diagnosis not present

## 2023-11-18 DIAGNOSIS — R42 Dizziness and giddiness: Secondary | ICD-10-CM | POA: Diagnosis present

## 2023-11-18 DIAGNOSIS — H538 Other visual disturbances: Secondary | ICD-10-CM | POA: Insufficient documentation

## 2023-11-18 DIAGNOSIS — R519 Headache, unspecified: Secondary | ICD-10-CM | POA: Diagnosis not present

## 2023-11-18 LAB — CBC
HCT: 39.2 % (ref 39.0–52.0)
Hemoglobin: 13.1 g/dL (ref 13.0–17.0)
MCH: 28.4 pg (ref 26.0–34.0)
MCHC: 33.4 g/dL (ref 30.0–36.0)
MCV: 85 fL (ref 80.0–100.0)
Platelets: 176 10*3/uL (ref 150–400)
RBC: 4.61 MIL/uL (ref 4.22–5.81)
RDW: 13.6 % (ref 11.5–15.5)
WBC: 7.8 10*3/uL (ref 4.0–10.5)
nRBC: 0 % (ref 0.0–0.2)

## 2023-11-18 LAB — BASIC METABOLIC PANEL
Anion gap: 10 (ref 5–15)
BUN: 14 mg/dL (ref 6–20)
CO2: 27 mmol/L (ref 22–32)
Calcium: 9.2 mg/dL (ref 8.9–10.3)
Chloride: 100 mmol/L (ref 98–111)
Creatinine, Ser: 0.87 mg/dL (ref 0.61–1.24)
GFR, Estimated: >60 mL/min (ref >60)
Glucose, Bld: 93 mg/dL (ref 70–99)
Potassium: 4.1 mmol/L (ref 3.5–5.1)
Sodium: 137 mmol/L (ref 135–145)

## 2023-11-18 NOTE — ED Notes (Signed)
 No answer when called several times from lobby

## 2023-11-18 NOTE — ED Triage Notes (Signed)
 Pt to ED for intermittent dizziness and headaches x1 month. Sent from PCP. Pt reports "I'm feeling fine today". Denies taking meds for h/a

## 2023-11-18 NOTE — ED Provider Triage Note (Signed)
 Emergency Medicine Provider Triage Evaluation Note  Shane Roza Sr. , a 38 y.o. male  was evaluated in triage.  Pt complains of remittent dizziness in the last month associated with cephalalgia, blurry vision.  Patient is taking gabapentin  3 times per day.  PCP referred him to ED  Review of Systems  Positive:  Negative:   Physical Exam  BP (!) 156/95   Pulse 69   Temp 97.7 F (36.5 C)   Resp 16   Ht 6' 1 (1.854 m)   Wt 72 kg   SpO2 98%   BMI 20.94 kg/m  Gen:   Awake, no distress   Resp:  Normal effort  MSK:   Moves extremities without difficulty  Other:    Medical Decision Making  Medically screening exam initiated at 4:06 PM.  Appropriate orders placed.  Shane Molt Sr. was informed that the remainder of the evaluation will be completed by another provider, this initial triage assessment does not replace that evaluation, and the importance of remaining in the ED until their evaluation is complete.  Patient presents with dizziness he will have EKG blood work   Shane Kast, PA-C 11/18/23 1607
